# Patient Record
Sex: Female | Born: 1945
Health system: Southern US, Community
[De-identification: ages and names within clinical notes are randomized; demographics above are authoritative.]

## PROBLEM LIST (undated history)

## (undated) DIAGNOSIS — I509 Heart failure, unspecified: Secondary | ICD-10-CM

## (undated) DIAGNOSIS — G4733 Obstructive sleep apnea (adult) (pediatric): Secondary | ICD-10-CM

## (undated) DIAGNOSIS — Z872 Personal history of diseases of the skin and subcutaneous tissue: Secondary | ICD-10-CM

## (undated) DIAGNOSIS — I480 Paroxysmal atrial fibrillation: Secondary | ICD-10-CM

## (undated) DIAGNOSIS — I2510 Atherosclerotic heart disease of native coronary artery without angina pectoris: Secondary | ICD-10-CM

## (undated) DIAGNOSIS — Z800 Family history of malignant neoplasm of digestive organs: Secondary | ICD-10-CM

## (undated) DIAGNOSIS — I495 Sick sinus syndrome: Secondary | ICD-10-CM

## (undated) DIAGNOSIS — I5030 Unspecified diastolic (congestive) heart failure: Secondary | ICD-10-CM

## (undated) DIAGNOSIS — E785 Hyperlipidemia, unspecified: Secondary | ICD-10-CM

## (undated) DIAGNOSIS — M1990 Unspecified osteoarthritis, unspecified site: Secondary | ICD-10-CM

## (undated) DIAGNOSIS — E559 Vitamin D deficiency, unspecified: Secondary | ICD-10-CM

## (undated) DIAGNOSIS — G629 Polyneuropathy, unspecified: Secondary | ICD-10-CM

## (undated) DIAGNOSIS — Z8049 Family history of malignant neoplasm of other genital organs: Secondary | ICD-10-CM

## (undated) DIAGNOSIS — Z8619 Personal history of other infectious and parasitic diseases: Secondary | ICD-10-CM

## (undated) DIAGNOSIS — R0600 Dyspnea, unspecified: Secondary | ICD-10-CM

## (undated) DIAGNOSIS — R32 Unspecified urinary incontinence: Secondary | ICD-10-CM

## (undated) DIAGNOSIS — Z8051 Family history of malignant neoplasm of kidney: Secondary | ICD-10-CM

## (undated) DIAGNOSIS — E079 Disorder of thyroid, unspecified: Secondary | ICD-10-CM

## (undated) DIAGNOSIS — I10 Essential (primary) hypertension: Secondary | ICD-10-CM

## (undated) DIAGNOSIS — Z950 Presence of cardiac pacemaker: Secondary | ICD-10-CM

## (undated) DIAGNOSIS — I059 Rheumatic mitral valve disease, unspecified: Secondary | ICD-10-CM

## (undated) DIAGNOSIS — Z45018 Encounter for adjustment and management of other part of cardiac pacemaker: Secondary | ICD-10-CM

## (undated) DIAGNOSIS — I219 Acute myocardial infarction, unspecified: Secondary | ICD-10-CM

## (undated) DIAGNOSIS — E039 Hypothyroidism, unspecified: Secondary | ICD-10-CM

## (undated) DIAGNOSIS — N289 Disorder of kidney and ureter, unspecified: Secondary | ICD-10-CM

## (undated) DIAGNOSIS — E119 Type 2 diabetes mellitus without complications: Secondary | ICD-10-CM

## (undated) DIAGNOSIS — Z923 Personal history of irradiation: Secondary | ICD-10-CM

## (undated) HISTORY — DX: Unspecified osteoarthritis, unspecified site: M19.90

## (undated) HISTORY — DX: Atherosclerotic heart disease of native coronary artery without angina pectoris: I25.10

## (undated) HISTORY — DX: Unspecified diastolic (congestive) heart failure: I50.30

## (undated) HISTORY — PX: BACK SURGERY: SHX140

## (undated) HISTORY — DX: Family history of malignant neoplasm of kidney: Z80.51

## (undated) HISTORY — DX: Family history of malignant neoplasm of other genital organs: Z80.49

## (undated) HISTORY — DX: Unspecified urinary incontinence: R32

## (undated) HISTORY — DX: Sick sinus syndrome: I49.5

## (undated) HISTORY — PX: COLONOSCOPY: SHX174

## (undated) HISTORY — PX: CARDIOVERSION: SHX1299

## (undated) HISTORY — DX: Personal history of other infectious and parasitic diseases: Z86.19

## (undated) HISTORY — DX: Personal history of diseases of the skin and subcutaneous tissue: Z87.2

## (undated) HISTORY — DX: Family history of malignant neoplasm of digestive organs: Z80.0

## (undated) HISTORY — DX: Obstructive sleep apnea (adult) (pediatric): G47.33

## (undated) HISTORY — PX: TUBAL LIGATION: SHX77

---
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
## 2000-03-02 ENCOUNTER — Encounter: Payer: Self-pay | Admitting: Neurosurgery

## 2000-03-04 ENCOUNTER — Ambulatory Visit (HOSPITAL_COMMUNITY): Admission: RE | Admit: 2000-03-04 | Discharge: 2000-03-05 | Payer: Self-pay | Admitting: Neurosurgery

## 2000-03-04 ENCOUNTER — Encounter: Payer: Self-pay | Admitting: Neurosurgery

## 2000-06-16 ENCOUNTER — Encounter (HOSPITAL_COMMUNITY): Admission: RE | Admit: 2000-06-16 | Discharge: 2000-07-16 | Payer: Self-pay | Admitting: Family Medicine

## 2000-06-21 ENCOUNTER — Encounter: Payer: Self-pay | Admitting: Family Medicine

## 2000-06-21 ENCOUNTER — Ambulatory Visit (HOSPITAL_COMMUNITY): Admission: RE | Admit: 2000-06-21 | Discharge: 2000-06-21 | Payer: Self-pay | Admitting: Family Medicine

## 2000-06-26 ENCOUNTER — Encounter: Payer: Self-pay | Admitting: Neurosurgery

## 2000-06-26 ENCOUNTER — Ambulatory Visit (HOSPITAL_COMMUNITY): Admission: RE | Admit: 2000-06-26 | Discharge: 2000-06-27 | Payer: Self-pay | Admitting: Neurosurgery

## 2000-07-21 ENCOUNTER — Encounter (HOSPITAL_COMMUNITY): Admission: RE | Admit: 2000-07-21 | Discharge: 2000-08-12 | Payer: Self-pay | Admitting: Neurosurgery

## 2000-08-13 ENCOUNTER — Encounter (HOSPITAL_COMMUNITY): Admission: RE | Admit: 2000-08-13 | Discharge: 2000-09-12 | Payer: Self-pay | Admitting: Neurosurgery

## 2000-12-30 ENCOUNTER — Encounter: Payer: Self-pay | Admitting: Family Medicine

## 2000-12-30 ENCOUNTER — Ambulatory Visit (HOSPITAL_COMMUNITY): Admission: RE | Admit: 2000-12-30 | Discharge: 2000-12-30 | Payer: Self-pay | Admitting: Family Medicine

## 2001-11-24 ENCOUNTER — Encounter: Admission: RE | Admit: 2001-11-24 | Discharge: 2002-02-22 | Payer: Self-pay | Admitting: Unknown Physician Specialty

## 2002-02-24 ENCOUNTER — Inpatient Hospital Stay (HOSPITAL_COMMUNITY): Admission: AD | Admit: 2002-02-24 | Discharge: 2002-02-25 | Payer: Self-pay | Admitting: Family Medicine

## 2003-05-25 ENCOUNTER — Ambulatory Visit (HOSPITAL_COMMUNITY): Admission: RE | Admit: 2003-05-25 | Discharge: 2003-05-25 | Payer: Self-pay | Admitting: Family Medicine

## 2003-05-30 ENCOUNTER — Ambulatory Visit (HOSPITAL_COMMUNITY): Admission: RE | Admit: 2003-05-30 | Discharge: 2003-05-30 | Payer: Self-pay | Admitting: Family Medicine

## 2003-12-25 ENCOUNTER — Ambulatory Visit (HOSPITAL_COMMUNITY): Admission: RE | Admit: 2003-12-25 | Discharge: 2003-12-25 | Payer: Self-pay | Admitting: Family Medicine

## 2003-12-25 ENCOUNTER — Ambulatory Visit: Payer: Self-pay | Admitting: *Deleted

## 2003-12-25 ENCOUNTER — Ambulatory Visit: Payer: Self-pay | Admitting: Cardiology

## 2003-12-25 ENCOUNTER — Inpatient Hospital Stay (HOSPITAL_COMMUNITY): Admission: AD | Admit: 2003-12-25 | Discharge: 2003-12-27 | Payer: Self-pay | Admitting: Family Medicine

## 2004-01-10 ENCOUNTER — Ambulatory Visit (HOSPITAL_COMMUNITY): Admission: RE | Admit: 2004-01-10 | Discharge: 2004-01-10 | Payer: Self-pay | Admitting: *Deleted

## 2004-01-10 ENCOUNTER — Ambulatory Visit: Payer: Self-pay | Admitting: *Deleted

## 2004-01-18 ENCOUNTER — Ambulatory Visit: Payer: Self-pay | Admitting: Cardiology

## 2004-04-29 ENCOUNTER — Ambulatory Visit: Payer: Self-pay | Admitting: Cardiology

## 2004-06-27 ENCOUNTER — Ambulatory Visit (HOSPITAL_COMMUNITY): Admission: RE | Admit: 2004-06-27 | Discharge: 2004-06-27 | Payer: Self-pay | Admitting: Family Medicine

## 2004-11-19 ENCOUNTER — Encounter (HOSPITAL_COMMUNITY): Admission: RE | Admit: 2004-11-19 | Discharge: 2004-12-19 | Payer: Self-pay | Admitting: Endocrinology

## 2005-03-05 ENCOUNTER — Inpatient Hospital Stay (HOSPITAL_COMMUNITY): Admission: EM | Admit: 2005-03-05 | Discharge: 2005-03-09 | Payer: Self-pay | Admitting: Emergency Medicine

## 2005-03-07 ENCOUNTER — Ambulatory Visit: Payer: Self-pay | Admitting: Internal Medicine

## 2005-03-24 ENCOUNTER — Ambulatory Visit (HOSPITAL_COMMUNITY): Admission: RE | Admit: 2005-03-24 | Discharge: 2005-03-24 | Payer: Self-pay | Admitting: Internal Medicine

## 2005-03-27 ENCOUNTER — Ambulatory Visit: Payer: Self-pay | Admitting: Internal Medicine

## 2005-07-03 ENCOUNTER — Ambulatory Visit (HOSPITAL_COMMUNITY): Admission: RE | Admit: 2005-07-03 | Discharge: 2005-07-03 | Payer: Self-pay | Admitting: Family Medicine

## 2005-07-23 ENCOUNTER — Ambulatory Visit (HOSPITAL_COMMUNITY): Admission: RE | Admit: 2005-07-23 | Discharge: 2005-07-23 | Payer: Self-pay | Admitting: Family Medicine

## 2005-07-30 ENCOUNTER — Ambulatory Visit (HOSPITAL_COMMUNITY): Admission: RE | Admit: 2005-07-30 | Discharge: 2005-07-30 | Payer: Self-pay | Admitting: Family Medicine

## 2005-12-08 ENCOUNTER — Ambulatory Visit: Payer: Self-pay | Admitting: Cardiovascular Disease

## 2005-12-08 ENCOUNTER — Inpatient Hospital Stay (HOSPITAL_COMMUNITY): Admission: AD | Admit: 2005-12-08 | Discharge: 2005-12-10 | Payer: Self-pay | Admitting: Family Medicine

## 2005-12-22 ENCOUNTER — Ambulatory Visit: Payer: Self-pay | Admitting: Cardiology

## 2005-12-30 ENCOUNTER — Ambulatory Visit: Payer: Self-pay | Admitting: Cardiology

## 2005-12-30 ENCOUNTER — Encounter (HOSPITAL_COMMUNITY): Admission: RE | Admit: 2005-12-30 | Discharge: 2006-01-12 | Payer: Self-pay | Admitting: Cardiology

## 2005-12-30 ENCOUNTER — Ambulatory Visit (HOSPITAL_COMMUNITY): Admission: RE | Admit: 2005-12-30 | Discharge: 2005-12-30 | Payer: Self-pay

## 2006-01-27 ENCOUNTER — Ambulatory Visit: Payer: Self-pay | Admitting: Cardiology

## 2006-10-13 ENCOUNTER — Ambulatory Visit (HOSPITAL_COMMUNITY): Admission: RE | Admit: 2006-10-13 | Discharge: 2006-10-13 | Payer: Self-pay | Admitting: Family Medicine

## 2008-03-23 ENCOUNTER — Ambulatory Visit (HOSPITAL_COMMUNITY): Admission: RE | Admit: 2008-03-23 | Discharge: 2008-03-23 | Payer: Self-pay | Admitting: Family Medicine

## 2008-08-02 DIAGNOSIS — K5730 Diverticulosis of large intestine without perforation or abscess without bleeding: Secondary | ICD-10-CM | POA: Insufficient documentation

## 2008-08-02 DIAGNOSIS — E669 Obesity, unspecified: Secondary | ICD-10-CM | POA: Insufficient documentation

## 2008-08-02 DIAGNOSIS — K5289 Other specified noninfective gastroenteritis and colitis: Secondary | ICD-10-CM

## 2008-08-02 DIAGNOSIS — K921 Melena: Secondary | ICD-10-CM | POA: Insufficient documentation

## 2008-08-02 DIAGNOSIS — R112 Nausea with vomiting, unspecified: Secondary | ICD-10-CM

## 2008-08-02 DIAGNOSIS — R109 Unspecified abdominal pain: Secondary | ICD-10-CM

## 2008-08-02 DIAGNOSIS — R197 Diarrhea, unspecified: Secondary | ICD-10-CM

## 2008-08-02 DIAGNOSIS — Z9189 Other specified personal risk factors, not elsewhere classified: Secondary | ICD-10-CM

## 2008-08-03 ENCOUNTER — Ambulatory Visit: Payer: Self-pay | Admitting: Gastroenterology

## 2008-08-03 ENCOUNTER — Encounter: Payer: Self-pay | Admitting: Internal Medicine

## 2008-08-04 ENCOUNTER — Encounter: Payer: Self-pay | Admitting: Internal Medicine

## 2008-08-16 ENCOUNTER — Ambulatory Visit (HOSPITAL_COMMUNITY): Admission: RE | Admit: 2008-08-16 | Discharge: 2008-08-16 | Payer: Self-pay | Admitting: Internal Medicine

## 2008-08-16 ENCOUNTER — Ambulatory Visit: Payer: Self-pay | Admitting: Internal Medicine

## 2008-08-16 HISTORY — PX: COLONOSCOPY: SHX174

## 2008-12-31 ENCOUNTER — Ambulatory Visit: Payer: Self-pay | Admitting: Internal Medicine

## 2008-12-31 ENCOUNTER — Inpatient Hospital Stay (HOSPITAL_COMMUNITY): Admission: EM | Admit: 2008-12-31 | Discharge: 2009-01-01 | Payer: Self-pay | Admitting: Emergency Medicine

## 2009-01-01 ENCOUNTER — Encounter (INDEPENDENT_AMBULATORY_CARE_PROVIDER_SITE_OTHER): Payer: Self-pay | Admitting: Emergency Medicine

## 2010-02-19 ENCOUNTER — Encounter (HOSPITAL_COMMUNITY): Payer: Medicare Other | Attending: Ophthalmology

## 2010-02-19 ENCOUNTER — Other Ambulatory Visit: Payer: Self-pay | Admitting: Ophthalmology

## 2010-02-19 DIAGNOSIS — Z01812 Encounter for preprocedural laboratory examination: Secondary | ICD-10-CM | POA: Insufficient documentation

## 2010-02-19 DIAGNOSIS — Z01810 Encounter for preprocedural cardiovascular examination: Secondary | ICD-10-CM | POA: Insufficient documentation

## 2010-02-19 LAB — DIFFERENTIAL
Basophils Absolute: 0 10*3/uL (ref 0.0–0.1)
Eosinophils Relative: 2 % (ref 0–5)
Lymphocytes Relative: 40 % (ref 12–46)
Monocytes Absolute: 0.5 10*3/uL (ref 0.1–1.0)

## 2010-02-19 LAB — CBC
HCT: 39.5 % (ref 36.0–46.0)
MCHC: 33.2 g/dL (ref 30.0–36.0)
RDW: 13.2 % (ref 11.5–15.5)

## 2010-02-19 LAB — BASIC METABOLIC PANEL
BUN: 17 mg/dL (ref 6–23)
Calcium: 9.9 mg/dL (ref 8.4–10.5)
GFR calc non Af Amer: 48 mL/min — ABNORMAL LOW (ref >60)
Glucose, Bld: 203 mg/dL — ABNORMAL HIGH (ref 70–99)
Sodium: 140 mEq/L (ref 135–145)

## 2010-02-26 ENCOUNTER — Ambulatory Visit (HOSPITAL_COMMUNITY)
Admission: RE | Admit: 2010-02-26 | Discharge: 2010-02-26 | Disposition: A | Discharge status: Home / Self Care | Payer: Medicare Other | Source: Ambulatory Visit | Attending: Ophthalmology | Admitting: Ophthalmology

## 2010-02-26 DIAGNOSIS — H2510 Age-related nuclear cataract, unspecified eye: Secondary | ICD-10-CM | POA: Insufficient documentation

## 2010-02-26 DIAGNOSIS — Z794 Long term (current) use of insulin: Secondary | ICD-10-CM | POA: Insufficient documentation

## 2010-02-26 DIAGNOSIS — E119 Type 2 diabetes mellitus without complications: Secondary | ICD-10-CM | POA: Insufficient documentation

## 2010-02-26 DIAGNOSIS — I10 Essential (primary) hypertension: Secondary | ICD-10-CM | POA: Insufficient documentation

## 2010-02-26 DIAGNOSIS — Z79899 Other long term (current) drug therapy: Secondary | ICD-10-CM | POA: Insufficient documentation

## 2010-03-08 ENCOUNTER — Encounter (HOSPITAL_COMMUNITY): Payer: Medicare Other | Attending: Ophthalmology

## 2010-03-12 ENCOUNTER — Ambulatory Visit (HOSPITAL_COMMUNITY)
Admission: RE | Admit: 2010-03-12 | Discharge: 2010-03-12 | Disposition: A | Discharge status: Home / Self Care | Payer: Medicare Other | Source: Ambulatory Visit | Attending: Ophthalmology | Admitting: Ophthalmology

## 2010-03-12 DIAGNOSIS — E119 Type 2 diabetes mellitus without complications: Secondary | ICD-10-CM | POA: Insufficient documentation

## 2010-03-12 DIAGNOSIS — Z79899 Other long term (current) drug therapy: Secondary | ICD-10-CM | POA: Insufficient documentation

## 2010-03-12 DIAGNOSIS — H2510 Age-related nuclear cataract, unspecified eye: Secondary | ICD-10-CM | POA: Insufficient documentation

## 2010-03-12 DIAGNOSIS — Z794 Long term (current) use of insulin: Secondary | ICD-10-CM | POA: Insufficient documentation

## 2010-04-15 LAB — BASIC METABOLIC PANEL
CO2: 28 mEq/L (ref 19–32)
Calcium: 9.1 mg/dL (ref 8.4–10.5)
GFR calc Af Amer: >60 mL/min (ref >60)
GFR calc non Af Amer: >60 mL/min (ref >60)
Sodium: 140 mEq/L (ref 135–145)

## 2010-04-15 LAB — D-DIMER, QUANTITATIVE: D-Dimer, Quant: 1.95 ug/mL-FEU — ABNORMAL HIGH (ref 0.00–0.48)

## 2010-04-15 LAB — BRAIN NATRIURETIC PEPTIDE: Pro B Natriuretic peptide (BNP): 234 pg/mL — ABNORMAL HIGH (ref 0.0–100.0)

## 2010-04-15 LAB — CBC
HCT: 39.7 % (ref 36.0–46.0)
MCHC: 33.7 g/dL (ref 30.0–36.0)
MCV: 89.8 fL (ref 78.0–100.0)
Platelets: 219 10*3/uL (ref 150–400)
RDW: 14.7 % (ref 11.5–15.5)

## 2010-04-15 LAB — LIPID PANEL
Cholesterol: 144 mg/dL (ref 0–200)
Total CHOL/HDL Ratio: 4.4 RATIO
Triglycerides: 344 mg/dL — ABNORMAL HIGH (ref <150)
VLDL: 69 mg/dL — ABNORMAL HIGH (ref 0–40)

## 2010-04-15 LAB — URINALYSIS, ROUTINE W REFLEX MICROSCOPIC
Bilirubin Urine: NEGATIVE
Nitrite: NEGATIVE
Protein, ur: NEGATIVE mg/dL
Specific Gravity, Urine: 1.007 (ref 1.005–1.030)
Urobilinogen, UA: 0.2 mg/dL (ref 0.0–1.0)

## 2010-04-15 LAB — HEMOGLOBIN A1C
Hgb A1c MFr Bld: 7.3 % — ABNORMAL HIGH (ref 4.6–6.1)
Mean Plasma Glucose: 163 mg/dL

## 2010-04-15 LAB — POCT I-STAT, CHEM 8
Calcium, Ion: 1.03 mmol/L — ABNORMAL LOW (ref 1.12–1.32)
HCT: 41 % (ref 36.0–46.0)
TCO2: 24 mmol/L (ref 0–100)

## 2010-04-15 LAB — TSH: TSH: 1.284 u[IU]/mL (ref 0.350–4.500)

## 2010-04-15 LAB — CARDIAC PANEL(CRET KIN+CKTOT+MB+TROPI)
CK, MB: 3.3 ng/mL (ref 0.3–4.0)
Relative Index: INVALID (ref 0.0–2.5)

## 2010-04-15 LAB — POCT CARDIAC MARKERS

## 2010-04-15 LAB — GLUCOSE, CAPILLARY

## 2010-04-15 LAB — CK TOTAL AND CKMB (NOT AT ARMC): Relative Index: 3.8 — ABNORMAL HIGH (ref 0.0–2.5)

## 2010-04-15 LAB — TROPONIN I: Troponin I: 0.04 ng/mL (ref 0.00–0.06)

## 2010-04-15 LAB — DIFFERENTIAL
Basophils Absolute: 0.1 10*3/uL (ref 0.0–0.1)
Basophils Relative: 1 % (ref 0–1)
Eosinophils Absolute: 0.2 10*3/uL (ref 0.0–0.7)
Eosinophils Relative: 2 % (ref 0–5)

## 2010-04-20 LAB — DIFFERENTIAL
Basophils Absolute: 0 10*3/uL (ref 0.0–0.1)
Eosinophils Relative: 1 % (ref 0–5)
Lymphocytes Relative: 37 % (ref 12–46)
Lymphs Abs: 2 10*3/uL (ref 0.7–4.0)
Neutro Abs: 2.9 10*3/uL (ref 1.7–7.7)

## 2010-04-20 LAB — CBC
HCT: 38.6 % (ref 36.0–46.0)
MCHC: 34 g/dL (ref 30.0–36.0)
MCV: 90.6 fL (ref 78.0–100.0)
Platelets: 179 10*3/uL (ref 150–400)
RDW: 14.1 % (ref 11.5–15.5)

## 2010-05-28 NOTE — Op Note (Signed)
NAME:  [name], [name]               ACCOUNT NO.:  [id number]   MEDICAL RECORD NO.:  [id number]          PATIENT TYPE:  AMB   LOCATION:  DAY                           FACILITY:  [facility]   PHYSICIAN:  R. Roetta Sessions, M.D. DATE OF BIRTH:  [birth year]   DATE OF PROCEDURE:  08/16/2008  DATE OF DISCHARGE:                               OPERATIVE REPORT   DIAGNOSTIC COLONOSCOPY   INDICATIONS FOR PROCEDURE:  A 65 year old lady with 1 out of 3 mail-in  cards positive for occult blood.  A couple of months back she had a  period where she had a couple of episodes of painless, small volume  blood per rectum.  She has no lower GI tract symptoms at this time.  Dr.  Karilyn Cota performed colonoscopy 10 years ago at which time she was found  have diverticulosis.  She has no personal history of polyps.  No family  history of colon polyps or colon cancer.  Colonoscopy is now being done.  Risks, benefits, alternatives and limitations have been discussed,  questions answered.  Please see documentation in the medical record.   PROCEDURE NOTE:  O2 saturation, blood pressure, pulse and respirations  were monitored throughout the entire procedure.  Conscious sedation  Versed 4 mg IV, Demerol 50 mg IV in divided doses.  Instrument:  Pentax  video chip system.   FINDINGS:  Digital rectal exam revealed no abnormalities.   ENDOSCOPIC FINDINGS:  The prep was marginal.  Colon:  Colonic mucosa was  surveyed from the rectosigmoid junction to the left, transverse, right  colon to the appendiceal orifice, ileocecal valve and cecum.  These  structures were well seen and photographed for the record.  From this  level, scope was slowly and cautiously withdrawn.  All previously  mentioned mucosal surfaces were again seen.  On the way in, there was  quite a bit of semi-formed chunky stool which was lavaged out.  The  patient was noted have densely populated pan colonic diverticula all the  way to the cecum.  However, the  remainder of the colonic mucosa appeared  normal.  Scope was pulled down in the rectum where thorough examination  of the rectal mucosa including retroflexed view of the anal verge  demonstrated no abnormalities.  The patient tolerated the procedure  well.  Cecal withdrawal time 7 minutes.   IMPRESSION:  Normal rectum.  Densely populated pan colonic diverticula.  The remainder of the colonic mucosa appeared normal.  Today's findings  are reassuring.  It sounds like she had some trivial bleeding 2 months  ago.   RECOMMENDATIONS:  1. Daily Metamucil or Benefiber fiber supplement.  2. Check a CBC today to see where we stand.  If she is not anemic,      given the fact she is not having any GI symptoms at this time, I      would not recommend further evaluation, other than she ought to      consider returning in 10 years for another colonoscopy.      Jonathon Bellows, M.D.  Electronically Signed  RMR/MEDQ  D:  08/16/2008  T:  08/16/2008  Job:  [id number]

## 2010-05-31 NOTE — Consult Note (Signed)
NAME:  [name], [name] NO.:  [id number]   MEDICAL RECORD NO.:  [id number]                   PATIENT TYPE:  INP   LOCATION:  A201                                 FACILITY:  [facility]   PHYSICIAN:   Bing, M.D. [facility]           DATE OF BIRTH:  1945-08-03   DATE OF CONSULTATION:  02/24/2002  DATE OF DISCHARGE:                                   CONSULTATION   HISTORY OF PRESENT ILLNESS:  A 65 year old woman with multiple  cardiovascular risk factors admitted to [facility] after a  prolonged episode of chest pressure.  The patient has no definite known  cardiac disease.  She has had occasional chest pressure in the past.  A  stress Cardiolite study in 1999 was reportedly negative.  In recent days,  after changing the scheduling of her medications, she has noted tachycardia  at night.  Last night this was associated with mid substernal chest pressure  of moderate severity.  There was no radiation.  There were no associated  symptoms.  She was unable to find a physician or perform any activity that  resulted in a change in her symptoms.  The discomfort gradually faded after  perhaps four or five hours allowing her to fall asleep.   Cardiovascular risk factors include hypertension and diabetes.  She is  participating in the [employer] study which apparently assesses the effects of  good blood pressure and diabetic control.  She does not use tobacco  products.  She believes that cholesterol has been good in the past.   CURRENT MEDICATIONS:  1. Prandin 2 mg q.h.s.  2. Avandia 4 mg b.i.d.  3. Metoprolol 200 mg daily.  4. Zestoretic 40/25 mg daily.  5. Zestril 20 mg daily.  6. Hydralazine 50 mg t.i.d.   ALLERGIES:  The patient reports adverse reactions to VALIUM, DEMEROL, TYLOX,  and PERCOCET.   PAST MEDICAL HISTORY:  Notable for previous lumbar microdiskectomy and a  lumbar hemilaminectomy.   SOCIAL HISTORY:  Lives in [place] with husband.   Performs own housework  but sometimes this leads to substantial fatigue.  She has three children and  three grandchildren.  She does not use tobacco products nor significant  amounts of alcohol.   FAMILY HISTORY:  Markedly positive for diabetes and cardiovascular disease.   REVIEW OF SYSTEMS:  Occasional joint swelling.  Occasional numbness in hands  and arms.  All other systems negative.   PHYSICAL EXAMINATION:  GENERAL:  Pleasant, overweight woman in no acute  distress.  VITAL SIGNS:  Blood pressure 170/80, heart rate 78 and regular, respirations  20, temperature 100, weight 182, CBG 207.  SKIN:  No significant lesions.  HEENT:  Anicteric sclerae.  NECK:  No jugular venous distention.  No carotid bruits.  ENDOCRINE:  No thyromegaly.  HEMATOPOIETIC:  No adenopathy.  CARDIAC:  Normal first and second heart sounds.  Fourth heart sound present.  LUNGS:  Clear.  ABDOMEN:  Soft and nontender.  No organomegaly.  EXTREMITIES:  Normal distal pulses.  No edema.  NEUROMUSCULAR:  Symmetric strength and tone.   LABORATORIES:  EKG:  Normal sinus rhythm, markedly delayed R-wave  progression probably related to lead placement.  Prior anterior myocardial  infarction cannot be excluded.  _______ ST segment depression in leads I and  L consistent with lateral ischemia.  When compared to a prior tracing  performed in 1999 lateral ST segment abnormalities were present, but not as  pronounced.   IMPRESSION:  The patient has substantial cardiovascular risk factors with  worrisome chest discomfort.  Her symptoms would be considered atypical for  angina in that they did not occur with exertion and have not occurred  repeatedly and reproducibly.  Since discomfort occurred only with  palpitations, this could be related to a supraventricular tachycardia  without underlying coronary disease.  The patient will be monitored  continuously.  Serial cardiac markers and EKGs will be obtained.  If these  remain  negative or nondiagnostic, we will proceed with a stress Cardiolite  study on the second hospital day.   Control of hypertension is suboptimal, although she did not take her  medications today.  I see no benefit to an extremely high dose ACE  inhibitor.  Hydralazine is relatively ineffective as an antihypertensive  agent.  These medications will be discontinued.  Diltiazem will be added at  a dose of 60 mg t.i.d.  If additional antihypertensive medication is needed,  transdermal clonidine can be added.   It is our pleasure assessing this delightful woman.  We will be happy to  follow her with you both in and out of hospital.                                               Yutan Bing, M.D. [facility]    RR/MEDQ  D:  02/24/2002  T:  02/24/2002  Job:  [id number]

## 2010-05-31 NOTE — Procedures (Signed)
NAME:  [name], HARMAN NO.:  [id number]   MEDICAL RECORD NO.:  [id number]          PATIENT TYPE:  REC   LOCATION:  RAD                           FACILITY:  [facility]   PHYSICIAN:  Vida Roller, M.D.   DATE OF BIRTH:  13-Nov-1945   DATE OF PROCEDURE:  DATE OF DISCHARGE:                                    STRESS TEST   PROCEDURE:  Stress Cardiolite   HISTORY:  This is a 65 year old female with past medical history significant  for diabetes and hypertension who was recently admitted to [facility] from December 12 to December 14 with apparent congestive heart  failure.  She has had 3 sets of cardiac enzymes that were negative. She had  an echocardiogram revealing normal LV function, mild MR and mild LVH.  She  was set up for this stress Cardiolite as an outpatient and has followup  scheduled with Dr. Dietrich Pates.   BASELINE DATA:  EKG reveals a sinus rhythm at 73 beats/minute with some  nonspecific ST abnormalities and poor R wave progression.  Blood pressure is  162/90.  Of note, the patient did not take her blood pressure medications  this morning and she has been holding her Toprol for the last 48 hours.   The patient exercised for a total of 7 minutes to 4.6 METS.  Maximum heart  rate was 155 beats/minute which is 97% of predicated maximum.  Maximum blood  pressure was 220/90.  EKG revealed no arrhythmias.  She did have some ST  depression in the inferolateral leads, resolving after approximately 12  minutes in recovery.  Blood pressure resolved down to 190/82.  She did take  her blood pressure medicines after her stress test was over. She complained  of shortness of breath with no chest discomfort during exercise which  resolved in recovery.  Exercise was stopped secondary to elevated blood  pressure and fatigue.   Final images and results are pending MD review.     Amy   AB/MEDQ  D:  01/10/2004  T:  01/10/2004  Job:  [id number]

## 2010-05-31 NOTE — Procedures (Signed)
NAME:  [name], [name]               ACCOUNT NO.:  [id number]   MEDICAL RECORD NO.:  [id number]          PATIENT TYPE:  AMB   LOCATION:  A226                          FACILITY:  [facility]   PHYSICIAN:  Scott A. Gerda Diss, MD    DATE OF BIRTH:  Feb 08, 1945   DATE OF PROCEDURE:  DATE OF DISCHARGE:                              EKG INTERPRETATION   EKG:  Patient with normal sinus rhythm, good T- wave abnormality with  flattening in V6 and some point appears.  The possible intraseptal  infarct age undetermined but there is a small upwave before the Q.   IMPRESSION:  Abnormal EKG.      Scott A. Gerda Diss, MD  Electronically Signed     SAL/MEDQ  D:  01/16/2006  T:  01/16/2006  Job:  [id number]

## 2010-05-31 NOTE — Procedures (Signed)
NAMEDESERAY, DAPONTE NO.:  [id number]   MEDICAL RECORD NO.:  [id number]          PATIENT TYPE:  INP   LOCATION:  A226                          FACILITY:  [facility]   PHYSICIAN:  Peter C. Eden Emms, MD, FACCDATE OF BIRTH:  02-May-1945   DATE OF PROCEDURE:  DATE OF DISCHARGE:                                ECHOCARDIOGRAM   INDICATIONS:  Chest pain, heart failure.   Left ventricular cavity size was normal.  There was mild basal septal  hypertrophy as well as mild concentric left ventricular hypertrophy.  Septal thickness was 14 mm.  There were no regional wall motion  abnormalities.  Mitral valve was mildly thickened with mild MR.  Left  atrium and right-sided cardiac chambers were normal.  There was no  evidence of pulmonary hypertension.  Aortic valve was trileaflet and  sclerotic.  There was no stenosis.  Subcostal imaging revealed no  atrial/septal defect, no source of embolus, no effusion.   FINAL IMPRESSION:  1. Mild left ventricular hypertrophy.  Ejection fraction 60%.  2. Mild mitral regurgitation.  3. Normal left atrium, right atrium and right ventricle.  4. Aortic valve sclerosis.  5. No pericardial effusion.  6. Mild tricuspid regurgitation with no evidence of pulmonary      hypertension.      [name]. Eden Emms, MD, [facility]  Electronically Signed     PCN/MEDQ  D:  12/09/2005  T:  12/09/2005  Job:  [id number]

## 2010-05-31 NOTE — Procedures (Signed)
NAMECANYON, WILLOW NO.:  [id number]   MEDICAL RECORD NO.:  [id number]          PATIENT TYPE:  OUT   LOCATION:  RAD                           [facility]ACILITY:  [facility]   PHYSICIAN:  Donna Bernard, M.D.DATE OF BIRTH:  1945-03-01   DATE OF PROCEDURE:  DATE OF DISCHARGE:  12/25/2003                                EKG INTERPRETATION   EKG reveals normal sinus rhythm with no significant ST-T changes. There is  poor RV progression in the anterior leads suggestive of old anterior MI.     Kristine Royal   WSL/MEDQ  D:  01/09/2004  T:  01/09/2004  Job:  [id number]

## 2010-05-31 NOTE — Procedures (Signed)
NAMETYKEISHA, PEER               ACCOUNT NO.:  [id number]   MEDICAL RECORD NO.:  [id number]          PATIENT TYPE:  INP   LOCATION:  A226                          F[facility]ILITY:  [facility]   PHYSICIAN:  Scott A. Gerda Diss, MD    DATE OF BIRTH:  1945-04-16   DATE OF PROCEDURE:  12/10/2005  DATE OF DISCHARGE:                              EKG INTERPRETATION   Normal sinus rhythm bordering on bradycardia.  Poor R wave progression.  Nonspecific T wave abnormalities.  Abnormal EKG.      Scott A. Gerda Diss, MD  Electronically Signed     SAL/MEDQ  D:  01/16/2006  T:  01/16/2006  Job:  [id number]

## 2010-05-31 NOTE — Discharge Summary (Signed)
[name], [name] NO.:  [id number]   MEDICAL RECORD NO.:  [id number]          PATIENT TYPE:  INP   LOCATION:  A303                          FACILITY:  [facility]   PHYSICIAN:  Osvaldo Shipper, MD     DATE OF BIRTH:  11/21/1945   DATE OF ADMISSION:  03/05/2005  DATE OF DISCHARGE:  02/25/2007LH                                 DISCHARGE SUMMARY   PRIMARY CARE PHYSICIAN:  Dr. Gerda Diss.   DISCHARGE DIAGNOSES:  1.  Small bowel enteritis leading to small bowel obstruction, currently much      improved.  2.  History of diabetes.  3.  History of hypertension.  4.  History of obesity.   HISTORY OF PRESENT ILLNESS:  The patient is a 65 year old Caucasian female  with a past medical history as mentioned above who presented to the  emergency department with complaints of abdominal pain, nausea, vomiting and  possible fever.  The patient was admitted to the hospital for further  evaluation.  She underwent abdominal film which showed no evidence for bowel  obstruction or perforation.  There was a question of minimal wall thickening  of the small bowel loop in the left mid abdomen.  The patient subsequently  underwent ultrasound of the abdomen which showed gallbladder sludge without  wall thickening or sonographic Murphy's sign.  A 9.0-mm cyst in the right  lower kidney was observed during this time.  Because of these inconclusive  studies, the patient underwent a CT of the abdomen and pelvis which showed  extensive colonic diverticulosis without definite diverticulitis.  Minimally  prominent proximal small bowel loops with fold and wall thickening were  noticed which raised the question of infectious enteritis or partial small  bowel obstruction.  Subsequent to this study, patient was started on  antibiotics.  The patient continued to show improvement with this regimen.  She was initially kept n.p.o. and then started on a liquid diet which was  advanced to a diabetic diet.   Patient has tolerated this diet well.  She was  also seen by Dr. Karilyn Cota from gastroenterology services whose recommendations  were followed.  The patient was switched to p.o. antibiotics today, and she  was considered stable enough for discharge.  The patient wished to go as  well.  Her blood sugars were elevated this morning.  The patient takes  Lantus at home which was held at the time of admission because of the  patient's n.p.o. status.  The patient was asked to take her Lantus  immediately as soon as she reaches home.  Her other vital signs remained  stable.  She was considered stable for discharge.  The patient also  developed mild diarrhea which Dr. Karilyn Cota felt could be controlled with  Imodium.   DISCHARGE MEDICATIONS:  1.  Ciprofloxacin 500 mg p.o. b.i.d. for 10 more days.  2.  Flagyl 500 mg b.i.d. for 10 more days.  3.  She was prescribed some Imodium for some diarrhea that she was having.   She was asked to resume her other medications as before.   FOLLOWUP:  Follow up with Dr. Karilyn Cota and Dr. Gerda Diss.   Followup studies need to be done as an outpatient and include CT angio of  the abdomen to look at the abdominal vasculature.  This will be arranged by  Dr. Karilyn Cota.   The patient was told that if her symptoms were to get worse or if her  diarrhea worsens, she needs to seek attention again.   Imaging studies have been described above including CT of the abdomen and  pelvis, ultrasound of the abdomen and abdominal x-ray.   DIET:  The patient will have a diabetic diet.   ACTIVITY:  No restrictions.   CONSULTATIONS:  Consultation obtained from Dr. Karilyn Cota from gastroenterology.      Osvaldo Shipper, MD  Electronically Signed     GK/MEDQ  D:  03/09/2005  T:  03/09/2005  Job:  [id number]   cc:   Lorin Picket A. Gerda Diss, MD  Fax: [phone]   Lionel December, M.D.  [address]  [place]  [place] [address]

## 2010-05-31 NOTE — Letter (Signed)
December 22, 2005    W. Simone Curia, M.D.  [address], [address]  [place], [place] [address]   RE:  [name], [name]  MRN:  [id number]  /  DOB:  Feb 03, 1945   Dear Brett Canales:   [name] returns to the office following a recent admission to [facility] with congestive heart failure.  She was evaluated by Dr.  Eden Emms, who recommended a slight increase in her dose of furosemide and  post-hospitalization stress testing.  This study was never scheduled.  [name] is back to baseline.  Her medications are unchanged, except  for substitution of 60 mg of furosemide daily from her previous dose of  40 mg.  She does not watch her weight.  She notes no change in diet and  no noncompliance with her medical therapy.   On exam, a pleasant overweight woman in no acute distress.  The weight  is 204, six pounds more than in April of 2006 at her last office visit.  Blood pressure 140/70, heart rate 60 and regular, respirations 16.  NECK:  No jugular venous distention; normal carotid upstrokes without  bruits.  LUNGS:  Clear.  CARDIAC:  Normal first and second heart sounds;  fourth heart sound present.  ABDOMEN:  Soft and nontender; no  organomegaly.  EXTREMITIES:  Distal pulses intact; 1/2+ edema.   IMPRESSION:  [name] has multiple cardiovascular risk factors and 2  admissions for congestive heart failure.  This certainly raises concern  about coronary artery disease.  I am somewhat inclined to proceed with  coronary angiography, but the patient would prefer  noninvasive testing.  We will repeat her stress nuclear study, which was  last done 2 years ago.  I will reassess her after that test has been  completed.  I suggested she weigh herself daily and call for significant  increase.  We will assess a metabolic profile due to her increased dose  of diuretic.  Vaccinations are up to date.    Sincerely,      Gerrit Friends. Dietrich Pates, MD, [facility]  Electronically Signed    RMR/MedQ  DD:  12/22/2005  DT: 12/23/2005  Job #: [id number]

## 2010-05-31 NOTE — H&P (Signed)
NAME:  [name], [name]                         ACCOUNT NO.:  [id number]   MEDICAL RECORD NO.:  [id number]                   PATIENT TYPE:  INP   LOCATION:  A201                                 FACILITY:  [facility]   PHYSICIAN:  Donna Bernard, M.D.             DATE OF BIRTH:  [birth year]   DATE OF ADMISSION:  02/24/2002  DATE OF DISCHARGE:                                HISTORY & PHYSICAL   CHIEF COMPLAINT:  Chest discomfort.   HISTORY OF PRESENT ILLNESS:  This patient is a 65 year old white female with  a longstanding history of hypertension, type 2 diabetes, hyperlipidemia, who  presented to the office after a one-year hiatus.  The patient states she is  now receiving her primary health care through a study through [facility]  which provides her medicines free of charge.  The patient has done  relatively well up until the night prior to admission, last evening, and on  into today.  She developed a sense of deep substernal pressure.  This was  accompanied by slight shortness of breath.  In addition, the patient noted  some tingling around her lips.  She had no particular nausea or diaphoresis.  The patient has had no exertional discomfort prior to this.  Family history  is significant for recent development of coronary artery disease in a  sibling who has had a stent.   MEDICATIONS:  The patient claims compliance with her medications, which  include:  1. Prandin 2 mg a.c.  2. Avandia 4 mg p.o. b.i.d.  3. Metformin 1000 mg q.a.m., 1000 q.p.m., 500 at bedtime.  4. Amaryl 4 mg p.o. b.i.d.  5. Toprol 100 mg twice per day.  6. Zestoretic, rather high dose with 220/12.5 tablets per day plus 20 plain     per day.  7. Hydralazine 50 mg three times daily.  Of note, the patient recently     initiated hydralazine.   PAST SURGICAL HISTORY:  Remote disk surgery.   FAMILY HISTORY:  Positive for the coronary artery disease, type 2 diabetes,  hypertension, hyperlipidemia.   SOCIAL HISTORY:   The patient is married.  Retired.  No smoking history.  No  alcohol abuse.  Three children.   ALLERGIES:  LESCOL.   REVIEW OF SYSTEMS:  Otherwise negative.   PHYSICAL EXAMINATION:  VITAL SIGNS:  Blood pressure 168/80.  Weight 182.  GENERAL:  The patient is alert, with some anxiety evident.  HEENT:  Normal.  NECK:  No JVD, no lymphadenopathy.  Pharynx normal.  LUNGS:  Clear.  HEART:  No significant murmurs.  No chest wall tenderness.  ABDOMEN:  Soft.  Good bowel sounds.  No rebound or guarding.  EXTREMITIES:  Normal.  NEUROLOGIC:  Intact.   LABORATORY DATA:  EKG:  Normal sinus rhythm with atypical ST segment changes  but nondiagnostic in leads 1 and 2.  She also has poor R-wave progression  through V3.  Cardiac enzymes pending.   IMPRESSION:  1. Chest pain, somewhat bothersome nature, with several significant risk     factors.  2. Type 2 diabetes.  3. Hypertension.  4. Hyperlipidemia.   PLAN:  Admit for observation, telemetry, cardiology consult.  Further orders  as noted in the chart.                                               Donna Bernard, M.D.    WSL/MEDQ  D:  02/24/2002  T:  02/24/2002  Job:  [id number]

## 2010-05-31 NOTE — Procedures (Signed)
[name], [name] NO.:  [id number]   MEDICAL RECORD NO.:  [id number]          PATIENT TYPE:  INP   LOCATION:  A222                          FACILITY:  [facility]   PHYSICIAN:  [place] Bing, M.D.  DATE OF BIRTH:  15-Dec-1945   DATE OF PROCEDURE:  12/25/2003  DATE OF DISCHARGE:                                  ECHOCARDIOGRAM   REFERRING PHYSICIAN:  Lubertha South, M.D.   CLINICAL DATA:  A 65 year old woman with congestive heart failure and  hypertension.  M-mode:  Aorta 3.1, left atrium 4.9, septum 1.6, posterior  wall 1.2, LV diastole 4.2, LV systole 2.3.   1.  Technically suboptimal and somewhat limited echocardiographic study.  2.  Mild left atrial enlargement; normal right atrium and right ventricle.  3.  Normal aortic root; aortic valve not well seen but probably normal.  No      stenosis nor insufficiency by Doppler.  4.  Very mild mitral valve calcification and mild annular calcification;      mild mitral regurgitation.  5.  Normal internal dimension of the left ventricle; mild concentric      hypertrophy.  Normal regional and global LV systolic function.     [place]obe   RR/MEDQ  D:  12/26/2003  T:  12/26/2003  Job:  [id number]

## 2010-05-31 NOTE — Consult Note (Signed)
NAMETEMPRENCE, RHINES NO.:  [id number]   MEDICAL RECORD NO.:  [id number]          PATIENT TYPE:  INP   LOCATION:  A303                          FACILITY:  [facility]   PHYSICIAN:  Lionel December, M.D.    DATE OF BIRTH:  April 22, 1945   DATE OF CONSULTATION:  03/07/2005  DATE OF DISCHARGE:                                   CONSULTATION   REASON FOR CONSULTATION:  Partial small-bowel obstruction.   HISTORY OF PRESENT ILLNESS:  [name] is a 65 year old Caucasian female  who was admitted to the [facility] two days ago after developing  acute onset mid-abdominal pain associated with nausea and vomiting.  She  describes having abdominal distension and pain from her epigastrium down to  the suprapubic region.  It was crampy in quality.  She notes having normal  bowel movements up until the day before admission.  No melena or rectal  bleeding.  She has chronic intermittent heartburn for which she takes Tums  or Prilosec p.r.n.  She denies any unintentional weight loss.  On digital  rectal examination by Dr. Gerda Diss, her stool was heme-negative.   Initially, she had an abdominal ultrasound which revealed gallbladder  sludge, common bile duct diameter was normal, possible fat infiltration of  the liver, small perihepatic ascites.  CT of the abdomen and pelvis  yesterday revealed a distended gallbladder but no calcified stones, slight  prominence of the gastric wall at the cardia, although the stomach was  completely distended; minimally dilated proximal small bowel loops with mild  fold and wall thickening and mild surrounding hazy infiltrative changes  consistent with infectious enteritis versus partial small-bowel obstruction.  She also had extensive colonic diverticulosis without evidence of  diverticulitis, slightly enlarged aortocaval lymph node measuring 15 x 11  mm, ascites.  LFTs were normal, except for albumin of 3.2, amylase and  lipase normal.  White count was  initially 14,200, and today it is 10,800.   MEDICATIONS PRIOR TO ADMISSION:  1.  Prilosec p.r.n.  2.  Tums p.r.n.  3.  Prandin 2 mg before lunch.  4.  Metformin 1000 mg before supper and 500 mg at bedtime.  5.  Toprol-XL 200 mg daily.  6.  Hydralazine 50 mg t.i.d.  7.  Aspirin 81 mg daily.  8.  Lantus 68 units q.h.s.  9.  Lispro insulin sliding scale.  10. Lasix 40 mg daily.  11. Mevacor 40 mg q.h.s.  12. Potassium 20 mEq daily.   ALLERGIES:  NO KNOWN DRUG ALLERGIES.   PAST MEDICAL HISTORY:  1.  Hypertension.  2.  Type 2 diabetes mellitus.  3.  Hyperlipidemia.  4.  Cardiomegaly.  5.  Diverticulosis.   PAST SURGICAL HISTORY:  1.  Back surgery for disks twice.  2.  Last colonoscopy in August of 2000 by Dr. Karilyn Cota revealed diverticulosis      involving the transverse, descending, and sigmoid colon.  She has never      had an upper endoscopy.   FAMILY HISTORY:  Negative for colorectal cancer.  She had a sister who  succumbed to  pancreatic cancer.  Another sister was treated for ovarian  cancer and is doing well.   SOCIAL HISTORY:  She is married and has three children.  She is retired.  She has never been a smoker.  No alcohol use.   REVIEW OF SYSTEMS:  See HPI for GI and constitutional.  CARDIOPULMONARY:  No  chest pain or shortness of breath.  GENITOURINARY:  No dysuria.   PHYSICAL EXAMINATION:  VITAL SIGNS:  Temperature 98.3, pulse 59,  respirations 18, blood pressure 125/66.  GENERAL:  Pleasant, moderately obese, elderly Caucasian female in no acute  distress.  SKIN:  Warm and dry.  No jaundice.  HEENT:  Sclerae nonicteric.  Oropharyngeal mucosal moist and pink.  No  lesions, erythema, or exudate.  NECK:  No lymphadenopathy, thyromegaly.  CHEST:  Lungs are clear to auscultation.  CARDIAC:  Regular rate and rhythm.  Normal S1 and S2.  No murmurs, rubs, or  gallops.  ABDOMEN:  Positive bowel sounds.  Obese but symmetrical.  Soft and  nontender.  No organomegaly or  masses appreciated.  EXTREMITIES:  No edema.   LABORATORY DATA:  As in HPI.  In addition, hemoglobin 13, hematocrit 39,  platelets 169,000.  Sodium 136, potassium 4, BUN 10, creatinine 0.8.   IMPRESSION:  The patient is a 65 year old Caucasian female who presented  with acute onset mid-abdominal pain associated with nausea and vomiting.  On  abdominal ultrasound, there is evidence of gallbladder sludge but no  discrete stones.  She does have some small perihepatic ascites.  Computed  tomography scan revealed a distended gallbladder.  It is unclear at this  time if the gallbladder findings are significant.  She also has minimally  dilated proximal small bowel loops with mild wall thickening and surrounding  infiltrative changes.  This is possibly due to infectious enteritis or  partial small-bowel obstruction.  Clinically, she appears to be doing better  at this point.  She has had multiple bowel movements post-contrast, and  bowel sounds are normal.  No evidence of significant abdominal distension.  Slight prominence of the gastric wall at the cardia on computed tomography  scan, although this is possibly a false finding given the stomach was not  completely distended on the study.   RECOMMENDATIONS:  1.  Will review CT films.  She may need to have small-bowel follow-through      and possibly upper GI series.  2.  Further recommendations to follow.      Tana Coast, P.A.      Lionel December, M.D.  Electronically Signed    LL/MEDQ  D:  03/07/2005  T:  03/07/2005  Job:  [id number]   cc:   Donna Bernard, M.D.  Fax: [phone]

## 2010-05-31 NOTE — Consult Note (Signed)
[name], [name] NO.:  [id number]   MEDICAL RECORD NO.:  [id number]          PATIENT TYPE:  INP   LOCATION:  A222                          FACILITY:  [facility]   PHYSICIAN:  [place] Bing, M.D.  DATE OF BIRTH:  [birth year]   DATE OF CONSULTATION:  12/25/2003  DATE OF DISCHARGE:  12/27/2003                                   CONSULTATION   REFERRING PHYSICIAN:  Dr. Lubertha South.   PRIMARY CARDIOLOGIST:  Dr. Dietrich Pates   HISTORY OF PRESENT ILLNESS:  A 65 year old woman with a history of diabetes  and hypertension admitted for apparent congestive heart failure.  [name]  has no known coronary disease.  She was evaluated in February 2004 for chest  pain, at which time a stress Cardiolite study revealed normal myocardial  perfusion and normal left ventricular function.  She had had a prior  Cardiolite in 1999, which was also negative.  She had been doing well in  recent months until developing some nocturnal wheezing for the past 2 weeks.  During the past few days, she has experienced increase in dyspnea on  exertion, such that she was dyspneic at rest when she presented for  evaluation.  She also described symptoms compatible with orthopnea.  She has  had substantial pedal edema, but this has resolved rapidly with leg  elevation.  She has not had any chest discomfort.  She notes no diaphoresis  nor nausea.  Since receiving her first dose of furosemide in the hospital,  she is significantly improved.   She has had longstanding diabetes treated with insulin.  She has a history  of hypertension that has been well-controlled.  Past surgical history  includes two operations on lumbar spine for low back pain.   ALLERGIES:  1.  VALIUM.  2.  DEMEROL.  3.  TYLOX.  4.  PERCOCET.   RECENT MEDICATIONS:  1.  Lovastatin.  2.  Lantus insulin.  3.  Metformin.  4.  Avandia.  5.  Prandin.  6.  Fish oil.  7.  Aspirin.  8.  Toprol 200 mg daily.  9.  Zestril 40 mg  daily.  10. Furosemide, typically 20 mg daily but recently increased to 40 mg.  11. Hydralazine 50 mg daily.   SOCIAL HISTORY:  Lives in [place] with her husband; not employed outside  the home.  Has three children and three grandchildren.  Denies the use of  tobacco products or significant use of alcohol.   FAMILY HISTORY:  Markedly  positive including coronary disease in her  mother, her father, and four brothers.  She also has two sisters who do not  have known cardiac disease.   REVIEW OF SYSTEMS:  Notable for intermittent numbness in the hands and arms.  She has had some wheezing and cough in recent weeks.  Other systems reviewed  and are negative.   PHYSICAL EXAMINATION:  GENERAL:  Pleasant, overweight woman in no acute  distress.  VITAL SIGNS:  Temperature 97.4, heart rate 64 and regular, respirations 20,  blood pressure 150/70, weight 206.  HEENT:  Anicteric sclerae; normal  conjunctivae.  NECK:  Mild jugular venous distension; normal carotid upstrokes without  bruits.  Neck is supple and fully mobile.  ENDOCRINE:  No thyromegaly.  HEMATOPOIETIC:  No cervical, axillary, or inguinal adenopathy.  LUNGS:  Good air movement; minimal rales at the left base - essentially  clear.  CARDIAC:  Normal 1st and 2nd heart sounds; no 3rd heart sound; grade 2/6  systolic ejection murmur at the base.  ABDOMEN:  Soft and nontender; no organomegaly.  EXTREMITIES:  1+ edema bilaterally; distal pulses intact.  NEUROMUSCULAR:  Symmetric strength and tone.  MUSCULOSKELETAL:  No joint deformities.   Chest x-ray:  Borderline cardiomegaly; dilatation of the azygos vein; no  pulmonary edema.   EKG:  Normal sinus rhythm; left axis; poor R wave progression; no change  from her prior tracing of February 2004.   Other laboratory notable for minimal edema with a hemoglobin of 11.6 and a  normal MCV, normal renal function, and normal cardiac markers.   IMPRESSION:  [name] had normal left  ventricular systolic function as  recently as a year and a half ago and has no known cardiomyopathy or other  myocardial disorder.  An echocardiogram will be key to determine if she has  developed same.  If not, the presumptive diagnosis is diastolic dysfunction  leading to very mild congestive heart failure.  She does not have a very  impressive exam or chest x-ray.  Alternative diagnoses should be considered.  A D-dimer level will be obtained as well as BNP level.  Diuresis will be  continued with monitoring of electrolytes.  It is possible that her  medications contributed to fluid retention, especially Avandia, which will  be held.   Hypertension is relative well-controlled.  Medication can be increased as  necessary.  Low dose enoxaparin will be provided to prophylaxis against deep  venous thrombosis.  Further evaluation of her minimal anemia can be  considered while she is in the hospital.   We greatly appreciate the referral of this nice woman and will be happy to  follow her with you, both in the hospital and subsequent discharge.     [place]obe   RR/MEDQ  D:  12/27/2003  T:  12/27/2003  Job:  [id number]

## 2010-05-31 NOTE — Procedures (Signed)
   NAME:  PAIZLEIGH, WILDS                         ACCOUNT NO.:  [id number]   MEDICAL RECORD NO.:  [id number]                   PATIENT TYPE:  INP   LOCATION:  A201                                 FACILITY:  [facility]   PHYSICIAN:  Vida Roller, M.D.                DATE OF BIRTH:  1945-07-11   DATE OF PROCEDURE:  02/25/2002  DATE OF DISCHARGE:                                    STRESS TEST   INDICATION:  The patient is a 65 year old female with no known coronary  artery disease who presented with complaint of chest discomfort associated  with palpitations concerning for cardiac ischemia.  She has some minimal EKG  changes which are concerning for ischemia.  She has had negative cardiac  enzymes.   BASELINE DATA:  EKG: Sinus rhythm at 64 beats per minute with nonspecific ST  changes and poor R wave progression.  Blood pressure 120/72.   The patient exercised for a total of 5 minutes and 29 seconds under the  Bruce protocol stage II.   STRESS DATA:  Maximum heart rate: 151  Maximum blood pressure: 210/90  EKG: No arrhythmia, no ischemic changes.  SYMPTOMS:  Shortness of breath which resolved in recovery.   Final images and results are pending MD review.      Amy Mercy Riding, P.A. [facility]                     Vida Roller, M.D.    AB/MEDQ  D:  02/25/2002  T:  02/25/2002  Job:  [id number]

## 2010-05-31 NOTE — Procedures (Signed)
[name], [name]               ACCOUNT NO.:  [id number]   MEDICAL RECORD NO.:  [id number]          PATIENT TYPE:  INP   LOCATION:  A226                          FACILITY:  [facility]   PHYSICIAN:  Scott A. Gerda Diss, MD    DATE OF BIRTH:  04/16/1945   DATE OF PROCEDURE:  DATE OF DISCHARGE:  12/10/2005                              EKG INTERPRETATION   INDICATIONS FOR PROCEDURE:  The patient with sinus bradycardia.   FINDINGS:  No acute changes noted.  Does have some atrial septal changes  consistent with infarct, age undetermined.   IMPRESSION:  Abnormal EKG.      Scott A. Gerda Diss, MD  Electronically Signed     SAL/MEDQ  D:  01/16/2006  T:  01/16/2006  Job:  [id number]

## 2010-05-31 NOTE — Discharge Summary (Signed)
   NAME:  [name], [name]                         ACCOUNT NO.:  [id number]   MEDICAL RECORD NO.:  [id number]                   PATIENT TYPE:  INP   LOCATION:  A201                                 FACILITY:  [facility]   PHYSICIAN:  Donna Bernard, M.D.             DATE OF BIRTH:  13-Jul-1945   DATE OF ADMISSION:  02/24/2002  DATE OF DISCHARGE:  02/25/2002                                 DISCHARGE SUMMARY   FINAL DIAGNOSES:  1. Chest pain.  2. Type 2 diabetes.  3. Hypertension.  4. Hyperlipidemia.   DISPOSITION:  1. The patient discharged home.  2. Follow up in the office in one to two weeks.  3. Maintain usual medications.   INITIAL HISTORY AND PHYSICAL:  Please see H&P that was dictated.   HOSPITAL COURSE:  This patient is a 65 year old white female with  longstanding history of hypertension, type 2 diabetes, and hyperlipidemia  who presented to the office after a one year hiatus.  She notes she now  receives her primary health care through [facility] where her medicines are  provided free of charge.  The patient noted chest discomfort, deep  substernal in nature.  It was accompanied by some shortness of breath with  no nausea or diaphoresis.  Due to her many risk factors the patient was  admitted to the hospital.  Her resting EKG showed nonspecific changes but  none diagnostic.  Cardiac enzymes were done in a serial fashion and proved  to be negative.  The cardiologists were consulted.  They recommended doing a  stress Cardiolite test.  The patient tolerated the stress well.  The results  came back negative.  With negative results and the patient feeling better,  along with stable EKG, cardiac enzymes, and telemetry, it was felt that the  patient could go home with the diagnoses and disposition as noted above.                                               Donna Bernard, M.D.    Karie Chimera  D:  04/05/2002  T:  04/06/2002  Job:  [id number]

## 2010-05-31 NOTE — Consult Note (Signed)
[name], [name] NO.:  [id number]   MEDICAL RECORD NO.:  [id number]          PATIENT TYPE:  INP   LOCATION:  A226                          FACILITY:  [facility]   PHYSICIAN:  Peter C. Eden Emms, MD, FACCDATE OF BIRTH:  07/05/1945   DATE OF CONSULTATION:  12/09/2005  DATE OF DISCHARGE:                                 CONSULTATION   REASON FOR CONSULTATION:  [name] is a 65 year old patient of Dr.  Gerrit Friends. Rothbart's whom we were asked to see for congestive heart  failure and shortness of breath.   HISTORY OF PRESENT ILLNESS:  The patient has a history of hypertension,  type 2 diabetes mellitus and hyperlipidemia.  She has a distant history  about four years ago of  diastolic congestive heart failure.  I do not  have the details of her previous workup.  The patient has noted that for  the last four to five days increasing shortness of breath.  She has had  PND and orthopnea.  She feels that she has had increasing lower  extremity edema.  She was seen by Dr. Gerda Diss in the office.  She  appeared to have some cardiac wheezing.  She was admitted to the  hospital and has had improvement with IV diuresis. She has not had any  significant sputum production or fever.  She has had a bit of a cough.   REVIEW OF SYSTEMS:  The patient's review of systems is otherwise  remarkable for no history of pulmonary embolus or deep venous  thrombosis.  She is morbidly obese.  There is no history of asthma.  She  is a non-smoker.  The patient has no history of coronary artery disease  and has not had a previous myocardial infarction or documentation of  decreased systolic function.  The review of systems is otherwise  noncontributory.   The patient's risk factors are followed closely by Dr. Gerda Diss, and  apparently she is in the ACCORD study, which manages her hypertension  and diabetes.   PAST SURGICAL HISTORY:  1. Ruptured disk in 2002.  2. She had a colonoscopy in 2002, which  was negative.   FAMILY HISTORY:  Remarkable for hypertension and diabetes, coronary  artery disease and hyperlipidemia.   SOCIAL HISTORY:  The patient is married.  She lives locally and does not  use alcohol.  She does not smoke.   ALLERGIES:  LESCOL.   CURRENT MEDICATIONS:  1. Are extensive.  She uses Lantus 90 units at night.  2. Aspartate insulin sidling scale.  3. Metformin 1 gram in the morning, 500 mg at lunch.  4. Prandin 2 mg p.r.n.  5. Lasix 40 mg in the morning.  6. Hydralazine 50 mg t.i.d.  7. Lotrel 5/20 mg.  8. Toprol 200 mg q.d.  9. An aspirin a day.  10.Mevacor.   PHYSICAL EXAMINATION:  VITAL SIGNS:  Blood pressure currently 135/78.  She is in a sinus rhythm at a rate of 78.  GENERAL:  She is morbidly obese.  HEENT:  Normal.  NECK:  No lymphadenopathy, no thyromegaly.  LUNGS:  Currently clear.  JVP is not visible.  HEART:  There is an S1, S2.  Normal heart sounds.  ABDOMEN:  Protuberant but not tender.  There is no hepatosplenomegaly.  EXTREMITIES:  Distal pulses intact.  She currently  has no edema.  NEUROLOGIC:  Nonfocal.  SKIN:  Warm and dry.   Electrocardiogram shows a sinus rhythm, nonspecific ST-T-wave changes  and poor R-wave progression.  There are no acute changes.   LABORATORY DATA:  Fairly unremarkable.  Beta natriuretic peptide was not  drawn.  Hematocrit 39.2, platelets 203.  Sodium 141, potassium 4.3,  creatinine normal at 0.6.  She has three negative sets of enzymes.  Her  liver function tests are normal.   A chest x-ray shows mild redistribution, possibly mild congestive  failure.   IMPRESSION:  The patient probably did have some mild volume overload.  A  beta natriuretic peptide would be reasonable to check.  She will have a  2-dimensional echocardiogram to further assess her left ventricular  function.   RECOMMENDATIONS:  I suspect that she will be able to be discharged  tomorrow.  It may be reasonable to change her Lasix to 40 mg in  the  morning and 20 mg in the afternoon and follow her.  I talked to the  patient at length regarding adjusting her Lasix, based on her weight at  home and her symptoms.  She clearly had classic PND, and the patient now  knows that she can increase her Lasix while she calls Dr. Gerda Diss to try  to avoid hospitalization.   FOLLOWUP:  The patient can follow up with Dr. Gerrit Friends. Rothbart in the  next week or two as an outpatient for a Myoview study, given her  multiple risk factors.  I think that ongoing vigilance to rule out  coronary artery disease is in order.      [name]. Eden Emms, MD, [facility]  Electronically Signed     PCN/MEDQ  D:  12/09/2005  T:  12/09/2005  Job:  [id number]

## 2010-05-31 NOTE — H&P (Signed)
[name], [name] NO.:  [id number]   MEDICAL RECORD NO.:  [id number]          PATIENT TYPE:  INP   LOCATION:  A303                          FACILITY:  [facility]   PHYSICIAN:  Donna Bernard, M.D.DATE OF BIRTH:  05-19-1945   DATE OF ADMISSION:  03/05/2005  DATE OF DISCHARGE:  LH                                HISTORY & PHYSICAL   CHIEF COMPLAINT:  Abdominal pain, vomiting, possible fever.   HISTORY OF PRESENT ILLNESS:  This patient is a 65 year old white female with  history of hypertension, type 2 diabetes mellitus, hyperlipidemia, history  of known diverticulosis on remote colonoscopy who presented to the office  the day of admission with acute complaints. The patient was in her usual  state of mildly complicated, but chronically stable health when she started  developing upper mid abdominal pain.  This was the evening prior to  admission. This was associated with nausea, through the night the patient  had pretty significant pain at times. At times it had a very severe crampy-  like nature.  She did have a couple of episodes of vomiting and after  vomiting she seemed to get somewhat better for a while.  The patient noted a  little dizziness at times. She had no blood in stools.  Her bowel movements  have been normal up to yesterday. She has not had a normal bowel movement  yet.  The patient's noted a low grade fever.  She has felt chills at times.  She has had no significant urinary symptoms.  The patient notes her pain  primarily is in the epigastric area, primarily the upper mid abdomen.  The  patient gives no history of ulcers.  She claims compliance with her chronic  medications.   MEDICATIONS:  Her chronic medications include:  1.  Prandin 2 mg before lunch.  2.  Metformin 1000 mg in the morning and for supper and 500 mg at bedtime.  3.  Toprol-XL 200 mg daily.  4.  Hydralazine 50 mg one t.i.d.  5.  Enteric coated aspirin 81 mg daily.  6.  Lantus 68  units q.h.s.  7.  Lispro insulin sliding scale before meals.  8.  Lasix 40 mg each morning.  9.  Mevacor 40 mg q.h.s.  10. K-Tab 20 mEq daily.   FAMILY HISTORY:  Significant for hypertension, diabetes, coronary artery  disease, hyperlipidemia.   ALLERGIES:  LESCOL.   SOCIAL HISTORY:  The patient is married, three children, retired, no tobacco  or alcohol use.   PAST SURGICAL HISTORY:  Disc surgery March 2002.  Last tetanus shot 2005.   REVIEW OF SYSTEMS:  Otherwise negative.   PHYSICAL EXAMINATION:  VITAL SIGNS:  Temperature 99.2, blood pressure  136/82, pulse 90, weight 198.  GENERAL APPEARANCE:  Patient is alert, in some distress, holding her upper  abdomen during periods of pain.  HEENT:  Pupils equal, round, reactive to light and accommodation, normal.  Hydration fair.  NECK:  Supple.  LUNGS:  Clear.  HEART:  Regular rate and rhythm, no significant murmurs.  ABDOMEN:  Moderate epigastric tenderness,  no rebound, no guarding. No  masses.  Rectal examination with Heme negative stool.  No stool in the  vault.  EXTREMITIES:  Thin, no significant edema.  NEUROLOGICAL:  Examination intact.   LABORATORY DATA:  Significant labs include chest x-ray with cardiomegaly  with some slight bronchitic changes.  Abdominal upright showed questionable  minimal thickening of the small bowel within the left mid abdomen.  Liver  enzymes with albumin slightly low 3.2, otherwise normal and amylase and  lipase are normal.  CBC with white blood cell count 14,500 with left shift.  MET7 normal other than slight elevation of glucose.  Calcium also borderline  elevated at 11.1.   IMPRESSION:  Rather sudden onset of epigastric pain, tenderness, vomiting,  elevated white blood cell count, Hemoccult negative stool.  Patient does not  appear in extremis but is in distress with discomfort.  We did a gallbladder  ultrasound that showed sludging but no thickening of the wall and no  inflammation.  The  pain does have colicky features to it at times.   PLAN:  Intravenous fluids, intravenous antibiotics, sliding scale insulin,  maintain blood pressure medications, CT scan of the abdomen tomorrow.   Diverticulitis is the highest on the list for working diagnoses but other  etiologies need to be considered.  Further orders as noted in the chart.      Donna Bernard, M.D.  Electronically Signed     WSL/MEDQ  D:  03/05/2005  T:  03/06/2005  Job:  [id number]

## 2010-05-31 NOTE — H&P (Signed)
NAMEPRUDY, CANDY NO.:  [id number]   MEDICAL RECORD NO.:  [id number]          PATIENT TYPE:  INP   LOCATION:  A226                          FACILITY:  [facility]   PHYSICIAN:  Donna Bernard, M.D.DATE OF BIRTH:  1945/09/30   DATE OF ADMISSION:  12/08/2005  DATE OF DISCHARGE:  LH                              HISTORY & PHYSICAL   CHIEF COMPLAINT:  Shortness of breath, chest pain and swollen ankles.   SUBJECTIVE:  This patient is a 65 year old white female with a history  of hypertension, type 2 diabetes mellitus, hyperlipidemia, congestive  heart failure felt to be diastolic in nature, who presents to the office  the day of admission with acute concerns.  The patient notes for the  last several days she has developed shortness of breath which is worse  at night.  It hits per particularly hard when she tries to lay flat.  In  addition, the patient has noted wheezing in the evening time.  Wheezing  has never been a problem for her.  She has also noted swelling in her  ankles, and, in addition to that, she has a sense of fullness and  tightness in her chest.  There is associated shortness of breath, though  no nausea or diaphoresis.  Of note, the patient was worked up for CHF a  couple years ago with no evidence of coronary ischemia at that time.  Her CHF was felt to be likely diastolic, though her echo results were  suboptimal.  The patient is followed by the ACCORD study which manages  her hypertension and type 2 diabetes.  We manage her hyperlipidemia, and  her recent LDL was down in the range of 46.   PRIOR SURGERIES:  1. Ruptured disk in 2002 with surgery.  2. Negative colonoscopy in 2002 with repeat needed in 10 years.   FAMILY HISTORY:  Significant for hypertension, diabetes, coronary artery  disease, hyperlipidemia.   SOCIAL HISTORY:  The patient is married.  No alcohol abuse.  No smoking  abuse.  Three children.   ALLERGIES:  LESCOL caused  tenesmus.   CURRENT MEDICATIONS:  The patient claims compliance with her current  medications which include:  1. Lantus recently increased to 90 units q.h.s.  2. Aspart insulin sliding scale.  3. Metformin 1000 mg q.a.m. and q.p.m., 500 mg at lunch time.  4. Prandin 2 mg p.o. p.r.n. when not using aspart insulin.  5. Lasix 40 mg q.a.m.  6. Hydralazine 50 mg t.i.d.  7. Lotrel 5/20 one daily.  8. Toprol XL 200 mg one daily.  9. Enteric-coated aspirin 81 mg one daily.  10.Mevacor 40 mg q.h.s.  (The patient claims compliance with all these medications).   REVIEW OF SYSTEMS:  Otherwise negative.   PHYSICAL EXAMINATION:  VITAL SIGNS:  Blood pressure 140/86, weight 205.  The patient is alert, in no acute distress.  HEENT: Normal.  NECK:  Supple.  JVD hard to assess due to large neck.  LUNGS:  Basilar crackles.  No wheezes currently.  HEART:  Regular rate and rhythm.  CHEST WALL:  Nontender.  ABDOMEN:  Soft.  No tenderness.  EXTREMITIES:  Ankles with 1+ edema.  Some venous stippling noted.   ELECTROCARDIOGRAM:  Nonspecific ST-T changes.   LABORATORIES:  Pending.   IMPRESSION:  1. Exacerbation of CHF with wheezing at night and orthopnea, likely      cardiac wheezing.  This, along with the swollen ankles, is      concerning for an exacerbation of CHF.  This may well be _________      her diastolic dysfunction acting up; however, with the sense of      chest fullness and her multiple risk factors, she is at risk for an      ischemic presentation superimposed on her chronic diastolic      failure.  Based on this, I think she needs to be in the hospital.      Rationale discussed with the patient.  2. Type 2 diabetes, clinically stable.  3. Hyperlipidemia.  4. Hypertension.   PLAN:  Admit for IV Lasix, cardiology consult, echocardiogram.  Maintain  other medications, other than Glucophage, sliding scale, CCNT.  Further  orders as noted on the chart.      Donna Bernard,  M.D.  Electronically Signed     WSL/MEDQ  D:  12/08/2005  T:  12/09/2005  Job:  [id number]

## 2010-05-31 NOTE — H&P (Signed)
NAMEKEILY, LEPP NO.:  [id number]   MEDICAL RECORD NO.:  [id number]          PATIENT TYPE:  INP   LOCATION:  A222                          FACILITY:  [facility]   PHYSICIAN:  Donna Bernard, M.D.DATE OF BIRTH:  [birth year]   DATE OF ADMISSION:  12/25/2003  DATE OF DISCHARGE:  LH                                HISTORY & PHYSICAL   CHIEF COMPLAINT:  Shortness of breath.   HISTORY OF PRESENT ILLNESS:  The patient arrived at the office on the day of  admission with complaints of acute shortness of breath over the past several  days accompanied by swelling in the legs and ankles. The patient has a long-  standing history of hypertension, type 2 diabetes and hyperlipidemia. She  had noted occasional transient shortness of breath with exertion over the  past few months but several days ago the patient had progressive swelling in  her legs and ankles accompanied by a sense of shortness of breath. The  patient also noted significant swelling.  She claimed no significant chest  pain although at times she had a nonspecific full sensation in her chest.  She had no nausea, no diaphoresis. The patient claims compliance with her  current medications which include:  1.  Prandin 2 mg before each meal.  2.  Avandia 4 mg b.i.d.  3.  Metformin 1000 mg q.a.m. and q.p.m., 500 mg q.h.s.  4.  Toprol 200 mg daily.  5.  Zestril 40 mg daily.  6.  Aspirin 81 mg daily.  7.  Hydralazine 50 mg t.i.d.  8.  Lantus 42 units q.h.s.  9.  Lasix 40 mg 1/2 daily.  10. Mevacor 20 mg daily.   PAST SURGICAL HISTORY:  Remote back surgery for ruptured disk.   FAMILY HISTORY:  Positive for hypertension, diabetes and a strong family  history of coronary artery disease including a sibling and her father.   ALLERGIES:  No true allergies.   SOCIAL HISTORY:  The patient is retired. No smoking history, no alcohol  history. She does have three children.   REVIEW OF SYSTEMS:  Otherwise negative.   PHYSICAL EXAMINATION:  VITAL SIGNS:  Blood pressure 140/90.  GENERAL:  The patient is alert, in no acute distress.  HEENT:  Pharynx normal.  NECK:  No jugular venous distension noted.  LUNGS:  Basilar crackles heard, no tachypnea.  HEART:  Regular rate and rhythm. No chest wall tenderness.  ABDOMEN:  Exam benign.  EXTREMITIES:  Edema 2+ in both legs. Hands without obvious edema.   LABORATORY DATA:  Chest x-ray shows increased fluid in the azygos vein with  new onset cardiomegaly. Cardiac enzymes negative. Chem-7 normal. CBC:  Hemoglobin borderline low at 11.5 with normocytic indices.   IMPRESSION:  Relatively acute onset of shortness of breath accompanied by  edema and chest x-ray showing probable increased fluid.  The patient is on  Avandia but she has a multitude of risk factors which could set her up for  silent ischemia and therefore a congestive heart failure from this.  1.  Type 2 diabetes.  2.  Hyperlipidemia.  3.  Hypertension.  4.  Strong family history of heart disease.   PLAN:  Admit patient for serial cardiac enzymes. Cardiology consultation.  Echocardiogram.  IV Lasix.  Further orders as noted in the chart.     Kristine Royal   WSL/MEDQ  D:  12/26/2003  T:  12/26/2003  Job:  [id number]

## 2010-05-31 NOTE — Op Note (Signed)
[place]. [facility]  Patient:    [name], [name]                      MRN: [id number] Proc. Date: 06/26/00 Adm. Date:  [id number] Attending:  Donalee Citrin P                           Operative Report  PREOPERATIVE DIAGNOSIS:  Recurrent L5-S1 ruptured disk and left S1 radiculopathy.  PROCEDURE:  Redo lumbar hemilaminectomy and microdiskectomy at L5-S1 with resection of recurrent disk and microscopic dissection of the left S1 nerve root.  SURGEON:  Garlon Hatchet., M.D.  FIRST ASSISTANT:  Reinaldo Meeker, M.D.  CLINICAL HISTORY:  The patient is a very pleasant 65 year old female who is four months status post lumbar laminectomy for L5-S1 ruptured disk and a left S1 radiculopathy.  She was doing very well up until about three weeks ago, when she was climbing some steps and the next day felt severe-onset back and left leg pain as before, radiating down to the bottom of her foot and the outside of her foot.  Preoperative imaging revealed a large recurrent disk rupture at L5-S1, compressing the left S1 nerve root.  The patient was extensively counseled of the risks and benefits of surgery and decided to proceed forward with redo lumbar laminectomy.  DESCRIPTION OF PROCEDURE:  The patient was brought in the OR, was induced under general anesthesia, positioned prone on the Wilson frame.  Her back was prepped and draped in the routine sterile fashion.  Her old incision was infiltrated with 10 cc of lidocaine with epinephrine, and a 10 blade scalpel was used to make the incision.  Bovie electrocautery was used to take it down through the subcutaneous tissue, and subperiosteal dissection was carried out to reveal the laminae of L5 and S1 and the medial aspect of the facet complex at L5-S1.  Intraoperative x-ray confirmed localization of the L5-S1 interspace.  The scar was then resected with a 4 Penfield and a 1 Penfield, and the margins of the scar tissue and the  residual of L4 and the medial aspect of the facet complex were dissected free using a 2 and 3 mm Kerrison punch.  The medial facetectomy was extended a little bit underneath the lamina of L5 to develop a plane, and then using a 4 Penfield, the remainder of this plane was developed down to the superior aspect of S1 and the S1 neural foramen.  At this time the operating microscope was draped and brought into the field.  Under microscope illumination, the remainder of the S1 nerve root was opened up after careful dissection of the scar tissue off the roof of S1 and the superior aspect of the S1 lamina.  Then the 4 Nicholos Johns was used to dissect the nerve root off the pedicle, and then the large, partially calcified recurrent disk rupture was appreciated underneath the nerve root. This was teased away with a 4 Penfield and a nerve hook, and a DErrico nerve root retractor was used to reflect the S1 nerve root medially.  Then using an 11 blade scalpel, repeat annulotomy was partially made, and a 2 mm Kerrison punch was used to remove part of the calcified fragment.  Then the remainder of the interspace was cleaned out with pituitary rongeurs.  There were several large fragments of disk that were teased away from underneath scar tissue and underneath the thecal sac  with the nerve hook and upgoing pituitaries and Epstein curette.  At the end of the diskectomy, there were no further fragments appreciated or obtained from the disk space, and the thecal sac was completely decompressed and explored with a coronary dilator as well as a hockey stick, and no further compression was noted.  The wound was copiously irrigated, and meticulous hemostasis was maintained.  Gelfoam was overlaid on top of the dura.  The fascia was closed with 0 interrupted Vicryl, the subcutaneous tissue closed with 2-0 interrupted Vicryls, and the skin was closed with a running 4-0 subcuticular.  Benzoin and Steri-Strips  were applied.  The patient went to the recovery room in stable condition.  At the end of the case, all needle counts and sponge counts were correct. DD:  06/26/00 TD:  06/26/00 Job: [id number]

## 2010-05-31 NOTE — Discharge Summary (Signed)
[name], HARTGROVE NO.:  [id number]   MEDICAL RECORD NO.:  [id number]          PATIENT TYPE:  INP   LOCATION:  A226                          FACILITY:  [facility]   PHYSICIAN:  Donna Bernard, M.D.DATE OF BIRTH:  08/12/1945   DATE OF ADMISSION:  12/08/2005  DATE OF DISCHARGE:  11/28/2007LH                               DISCHARGE SUMMARY   FINAL DIAGNOSES:  1. Acute congestive heart failure  2. Type 2 diabetes.  3. Hyperlipidemia.  4. Myocardial infarction ruled out,  5. Hypertension.  6. History of diastolic congestive heart failure.   FINAL DISPOSITION:  1. Patient discharged to home.  2. Patient maintain same meds except increased the Lasix to 40 mg each      morning and one half of 40 in early afternoon.   FOLLOWUP:  1. Dr. Dietrich Pates for a stress test in 7-10 days.  2. Follow up in our office in one to two weeks.   INITIAL HISTORY AND PHYSICAL:  Please see H&P as dictated.   HOSPITAL COURSE:  This patient is a 65 year old female with a history of  multiple chronic medical problems including hypertension, type 2  diabetes, and diabetes mellitus who had in the past presented with  diastolic congestive heart failure.  She presented to the office with  shortness of breath and sense of wheezing.  Her exam showed crackles in  the base and she had very significant orthopnea.  Due to her multiple  risk factors for potential ischemic exacerbation of CHF, the patient was  admitted to the hospital.  She was diuresed with IV Lasix.  Serial  cardiac enzymes proved negative.  We covered her with therapeutic levels  of Lovenox.  Cardiology folks were consulted.  Ejection fraction was  60%, so therefore she was still felt to be diastolic.  The patient's  fluid cleared up nicely.  Cardiologist likely thought it was not  ischemic in nature but recommended a stress test and followup.  On day  of discharge, the patient and breathing easier, exam was stable, and she  was  discharged home with diagnosis and disposition as noted above.     Donna Bernard, M.D.  Electronically Signed    WSL/MEDQ  D:  02/26/2006  T:  02/26/2006  Job:  [id number]

## 2010-05-31 NOTE — Op Note (Signed)
[place]. [facility]  Patient:    [name], [name]                        MRN: [id number] Proc. Date: 03/04/00 Adm. Date:  [id number] Attending:  Donalee Citrin P                           Operative Report  PREOPERATIVE DIAGNOSIS:  S1 radiculopathy, left secondary to ruptured disk, L5-S1, left.  POSTOPERATIVE DIAGNOSIS:  S1 radiculopathy, left secondary to ruptured disk, L5-S1, left.  OPERATION PERFORMED:  Lumbar microdiskectomy, L5-S1 left microscope ____________ left S1 nerve root and microdiskectomy L5-S1 interspace on the left.  SURGEON:  Donalee Citrin, Montez Hageman.  ASSISTANT:  Julio Sicks, M.D.  ANESTHESIA:  General endotracheal.  ESTIMATED BLOOD LOSS:  Less than 100 cc.  INDICATIONS FOR PROCEDURE:  The patient is a very pleasant 65 year old female who has had progressive worsening back and left leg pain that radiates down the back of her leg to the left heel.  She had failed conservative treatment with anti-inflammatories and therapy.  The pain has become progressively worse and preoperative imaging revealed a large ruptured disk with free fragment migrating caudal from the interspace compressing the left S1 nerve root.  I think that secondary to the size of the fragment and her degree of discomfort and failure of conservative treatment I recommended surgical excision to the patient and extensively went over the risks and benefits of surgery.  She understands and agreed to proceed.  DESCRIPTION OF PROCEDURE:  The patient was brought to the operating room and was induced under general anesthesia.  She was placed prone on the Wilson frame and her back was prepped and draped in the usual sterile fashion. Preoperative x-ray confirmed needle marker over the L5-S1 interspace.  The wound was infiltrated with 1% lidocaine with epinephrine.  A twin blade scalpel was used to incise the skin ____________ electrocautery taken down to subcutaneous tissues.  Subperiosteal  ____________ fascia divided on patients left side and subperiosteal dissection was carried out on the L5 and S1 lamina on patients left side.  Self-retaining retractor was placed.  Intraoperative x-ray confirmed the L5-S1 interspace.  Then using a 3 and 4 mm Kerrison punch remainder of the inferior lamina of  L5 was removed as well as the medial facet and superior lamina of S1.  The ligamentum flavum was removed in piecemeal fashion and the dura was visualized.  The S1 nerve root was identified.  Then the operating microscope was draped and brought into the field.  Under microscope illumination the remainder of the S1 nerve root was decompressed and  tracked out the foramen.  Then under microscope illumination with the Penfield S1 nerve root was attempted to be mobilized off of a large fragment underneath it.  This was not able to be performed.  Then attention was taken to the axilla of the S1 nerve root.  The veins were coagulated using a nerve hook.  The axilla was opened up and a large free fragment disk was teased out from within the axilla using a combination of nerve  hook and pituitary rongeurs.  Two very large ____________ fragments were removed from the axilla and the S1 root was mobilized and freed up and ____________ refelclted medially.  The interspace was identified.  Epidural veins were coagulated.  ____________ 11 blade scalpel and pituitary rongeurs used to clean out the interspace using combination of  downgoing Epstein curets and pituitary rongeurs ____________ interspace was cleaned out.  Then attention was taken back to the axilla.  Another small fragment was able to be teased out and removed.  Then using a hockey stick and a blunt nerve hook the remainder of the axilla was explored.  No further compressive fragment was appreciated.  The hockey stick was used to explore the foramen and the thecal sac medially cephalad ____________ no further disk herniations  appreciated. Interspace was cleaned out. Wound was copiously irrigated.  Meticulous hemostasis was maintained.  Gelfoam was overlaid top of the dura.  Fascia was closed using 0 interrupted Vicryls.  Subcutaneous tissues closed with 2-0 interrupted Vicryls.  Skin closed with running 4-0 subcuticular.  Benzoin and Steri-Strips applied.  Patient recovery room stable condition.DD:  03/04/00 TD:  03/04/00 Job: [id number]

## 2010-05-31 NOTE — Letter (Signed)
January 27, 2006    W. Simone Curia, M.D.  [address]  [place], [place] [address]   RE:  [name], [name]  MRN:  [id number]  /  DOB:  06/27/1945   Dear Brett Canales:   [name] returns to the office for continued assessment and treatment  of congestive heart failure with preserved left ventricular systolic  function. She underwent a stress nuclear study. Her electrocardiographic  response was consistent with ischemia, but her images were entirely  normal suggesting that the EKG findings represent a false positive. She  has done well since her last visit with class II exertional dyspnea and  no orthopnea nor PND. Weights have been stable. She has had minimal, if  any, peripheral edema.   CURRENT MEDICATIONS:  1. Lantus insulin.  2. Hydralazine 50 mg t.i.d.  3. Toprol 200 mg daily.  4. Furosemide 40 mg a.m. and 20 mg p.m.  5. Fish oil 3 capsules per day.  6. Lotrel 5/20 mg daily.  7. Lovastatin 40 mg daily.  8. Prandin 2 mg daily.  9. Metformin 2500 mg per day in divided doses.  10.NovoLog per serum glucose.   PHYSICAL EXAMINATION:  GENERAL:  Overweight pleasant woman in no acute  distress.  VITAL SIGNS:  The weight is 206, 2 pounds more than 1 month ago. Blood  pressure 135/70, heart rate 66 and intermittently irregular.  Respirations 16.  NECK:  No jugular venous distention; normal carotid upstrokes without  bruits.  LUNGS:  Clear.  CARDIAC:  Normal first and second heart sounds; minimal basilar systolic  ejection murmur.  ABDOMEN:  Soft and nontender; no organomegaly.  EXTREMITIES:  1/2+ ankle and pretibial edema.   RHYTHM STRIP:  Normal sinus rhythm; borderline first degree AV block; no  arrhythmia identified with 2 minutes of monitoring.   Her recent chemistry profile is entirely normal. Potassium was 3.9, BUN  14 and creatinine 0.8.   IMPRESSION:  [name] is doing well with diuretics alone. Presumably,  her congestive heart failure is related to diastolic  dysfunction. She  has no valvular abnormalities identified nor does she apparently have  significant coronary artery disease. Renal artery stenosis would be a  consideration, but is more likely to occur with a degree of renal  insufficiency. For now, we will perform no other testing except to  monitor electrolytes and renal function. I will see this nice woman  again in 5 months. Vaccinations are up-to-date.    Sincerely,      Gerrit Friends. Dietrich Pates, MD, [facility]  Electronically Signed    RMR/MedQ  DD: 01/27/2006  DT: 01/27/2006  Job #: [id number]

## 2011-02-19 ENCOUNTER — Encounter: Payer: Self-pay | Admitting: Cardiovascular Disease

## 2011-09-19 ENCOUNTER — Encounter (HOSPITAL_COMMUNITY): Payer: Self-pay | Admitting: Pharmacy Technician

## 2011-09-30 ENCOUNTER — Encounter (HOSPITAL_COMMUNITY)
Admission: RE | Disposition: A | Discharge status: Home / Self Care | Payer: Self-pay | Source: Ambulatory Visit | Attending: Cardiology

## 2011-09-30 ENCOUNTER — Ambulatory Visit (HOSPITAL_COMMUNITY)
Admission: RE | Admit: 2011-09-30 | Discharge: 2011-09-30 | Disposition: A | Discharge status: Home / Self Care | Payer: Medicare Other | Source: Ambulatory Visit | Attending: Cardiology | Admitting: Cardiology

## 2011-09-30 ENCOUNTER — Encounter (HOSPITAL_COMMUNITY): Payer: Self-pay | Admitting: Cardiology

## 2011-09-30 DIAGNOSIS — I10 Essential (primary) hypertension: Secondary | ICD-10-CM | POA: Insufficient documentation

## 2011-09-30 DIAGNOSIS — E785 Hyperlipidemia, unspecified: Secondary | ICD-10-CM | POA: Insufficient documentation

## 2011-09-30 DIAGNOSIS — E669 Obesity, unspecified: Secondary | ICD-10-CM | POA: Insufficient documentation

## 2011-09-30 DIAGNOSIS — E119 Type 2 diabetes mellitus without complications: Secondary | ICD-10-CM | POA: Insufficient documentation

## 2011-09-30 DIAGNOSIS — R0989 Other specified symptoms and signs involving the circulatory and respiratory systems: Secondary | ICD-10-CM | POA: Insufficient documentation

## 2011-09-30 DIAGNOSIS — R0609 Other forms of dyspnea: Secondary | ICD-10-CM | POA: Insufficient documentation

## 2011-09-30 DIAGNOSIS — I059 Rheumatic mitral valve disease, unspecified: Secondary | ICD-10-CM | POA: Insufficient documentation

## 2011-09-30 DIAGNOSIS — I2510 Atherosclerotic heart disease of native coronary artery without angina pectoris: Secondary | ICD-10-CM | POA: Insufficient documentation

## 2011-09-30 HISTORY — PX: LEFT AND RIGHT HEART CATHETERIZATION WITH CORONARY ANGIOGRAM: SHX5449

## 2011-09-30 LAB — POCT I-STAT 3, VENOUS BLOOD GAS (G3P V)
Acid-Base Excess: 4 mmol/L — ABNORMAL HIGH (ref 0.0–2.0)
Acid-Base Excess: 4 mmol/L — ABNORMAL HIGH (ref 0.0–2.0)
Bicarbonate: 29.7 mEq/L — ABNORMAL HIGH (ref 20.0–24.0)
O2 Saturation: 67 %
O2 Saturation: 68 %
TCO2: 31 mmol/L (ref 0–100)
pCO2, Ven: 48.6 mmHg (ref 45.0–50.0)

## 2011-09-30 LAB — POCT I-STAT 3, ART BLOOD GAS (G3+): pH, Arterial: 7.386 (ref 7.350–7.450)

## 2011-09-30 LAB — GLUCOSE, CAPILLARY
Glucose-Capillary: 130 mg/dL — ABNORMAL HIGH (ref 70–99)
Glucose-Capillary: 167 mg/dL — ABNORMAL HIGH (ref 70–99)

## 2011-09-30 SURGERY — LEFT AND RIGHT HEART CATHETERIZATION WITH CORONARY ANGIOGRAM
Anesthesia: LOCAL

## 2011-09-30 MED ORDER — ONDANSETRON HCL 4 MG/2ML IJ SOLN: 4.0000 mg | Freq: Four times a day (QID) | INTRAMUSCULAR | Status: DC | PRN

## 2011-09-30 MED ORDER — SODIUM CHLORIDE 0.9 % IJ SOLN: 3.0000 mL | Freq: Two times a day (BID) | INTRAMUSCULAR | Status: DC

## 2011-09-30 MED ORDER — SODIUM CHLORIDE 0.9 % IJ SOLN: 3.0000 mL | INTRAMUSCULAR | Status: DC | PRN

## 2011-09-30 MED ORDER — VERAPAMIL HCL 2.5 MG/ML IV SOLN
INTRAVENOUS | Status: AC
  Filled 2011-09-30: qty 2

## 2011-09-30 MED ORDER — HYDROMORPHONE HCL PF 2 MG/ML IJ SOLN
INTRAMUSCULAR | Status: AC
  Filled 2011-09-30: qty 1

## 2011-09-30 MED ORDER — NITROGLYCERIN 0.2 MG/ML ON CALL CATH LAB
INTRAVENOUS | Status: AC
  Filled 2011-09-30: qty 1

## 2011-09-30 MED ORDER — LIDOCAINE HCL (PF) 1 % IJ SOLN
INTRAMUSCULAR | Status: AC
  Filled 2011-09-30: qty 30

## 2011-09-30 MED ORDER — MIDAZOLAM HCL 2 MG/2ML IJ SOLN
INTRAMUSCULAR | Status: AC
  Filled 2011-09-30: qty 2

## 2011-09-30 MED ORDER — SODIUM CHLORIDE 0.9 % IV SOLN
INTRAVENOUS | Status: DC
  Administered 2011-09-30: 07:00:00 via INTRAVENOUS

## 2011-09-30 MED ORDER — SODIUM CHLORIDE 0.9 % IV SOLN: 1.0000 mL/kg/h | INTRAVENOUS | Status: DC

## 2011-09-30 MED ORDER — ACETAMINOPHEN 325 MG PO TABS: 650.0000 mg | ORAL_TABLET | ORAL | Status: DC | PRN

## 2011-09-30 MED ORDER — SODIUM CHLORIDE 0.9 % IV SOLN: 250.0000 mL | INTRAVENOUS | Status: DC | PRN

## 2011-09-30 MED ORDER — METFORMIN HCL 1000 MG PO TABS: 1000.0000 mg | ORAL_TABLET | Freq: Two times a day (BID) | ORAL | Status: DC

## 2011-09-30 MED ORDER — ASPIRIN 81 MG PO CHEW
324.0000 mg | CHEWABLE_TABLET | ORAL | Status: AC
  Administered 2011-09-30: 324 mg via ORAL
  Filled 2011-09-30: qty 4

## 2011-09-30 NOTE — H&P (Signed)
  Please see paper chart  

## 2011-09-30 NOTE — CV Procedure (Signed)
Procedures performed: Right and left heart catheterization and occlusion of cardiac output and cardiac index by Fick. Right antecubital vein access and right radial artery access was utilized for performing the procedure.   Indication: Patient is a 67 year-old female with diabetes, hyperlipidemia, hypertension,   obesity  Presenting with dyspnea. Outpatient stress testing had revealed normal perfusion last year. However due to worsening symptoms, she is brought to the cardiac catheterization lab to evaluate  coronary anatomy. Right heart catheterization being performed for evaluation of dyspnea and pulmonary hypertension and to evaluate cardiac output and cardiac index.  A 5 French brachial sheath and a 6 French right radial sheath introduced into antecubital vein and radial artery access respectively. A 5 French Swan-Ganz catheter was advanced with balloon inflated on the sheath under fluoroscopic guidance into first the right atrium followed by the right ventricle and into the pulmonary artery to pulmonary artery wedge position. Hemodynamics were obtained in a locations.  After hemodynamics were completed, samples were taken for SaO2% measurement to be used in [facility] /Index catheterization.  The catheter was then pulled back the balloon down and then completely out of the body.   Left Heart Catheterization   Procedural data:  RA pressure 12/7  Mean 7 mm mercury. RA saturation not obtained.  RV pressure 69/5 and Right ventricular EDP 10 mm Hg. Please note after correction of the waveform, RV not re-recorded and hence difference in PA pressure and RV pressure PA pressure 51/22 with a mean of 30  mm mercury. PA saturation 68%.  Pulmonary capillary wedge 19/33 with a mean of 22 mm Hg. Aortic saturation 88%.  Cardiac output was 7.19 with cardiac index of 3.71  by Fick.   Angiographic data  Left ventricle: RAO view performed.  Left systolic shows normal ejection fraction of 55% -60%  Right coronary  artery: Dominant giving origin to a very large PDA patent. Midsegment has a 50% stenosis. There is mild luminal irregularity.  Left main coronary artery: Normal. No stenosis.   LAD: Large, smooth with mild luminal irregularity. Gives origin to a moderate sized diagonal 1 which is smooth and normal.   Circumflex coronary artery: Smooth in the proximal segment. Distal segment shows 50-60% diffuse stenosis. Non-dominant.  Impression: Moderate coronary artery disease involving the mid RCA and distal circumflex carotid artery. Moderate pulmonary tension. There is evidence of mitral valve stenosis with gradient across mitral valve by simultaneous wedge tracing and left ventricle was performed. The mean gradient across the mitral valve was 11.8. Peak to peak gradient was 5 mm mercury. Calculated mitral valve area was 1.69 cm. Although very large giant V waves were evident during wedge tracings, left ventricle gram did not confirm presence of severe mitral regurgitation.  Suspect shortness of breath due to moderate pulmonary tension and we will evaluate mitral valve anatomy with either a repeat transthoracic echocardiogram or consider TEE.   Technique:  First a 5 [place] Tig4 catheter was advanced over standard J-wire into the ascending aorta and used to engage first the Left and Right Coronary Artery. Multiple cineangiographic views of the Left then Right Coronary Artery system(s) were performed. I initially performed LV gram via hand contrast injection. However after having evaluated right heart pressures, I decided to go with pigtail catheter to perform LV gram. A 5  French angled pigtail catheter which was used to cross the aortic valve for measurement Left Ventricular Hemodynamics. Left ventriculography was then performed in the RAO projection. Hemodynamics were then resampled and the catheter  pulled back across the aortic valve for measurement of "pullback" gradient. Simultaneous LV and pulmonary capillary  wedge pressures were also recorded. The catheter was then removed the body over wire. All exchanges were made over standard J wire.

## 2011-09-30 NOTE — Interval H&P Note (Signed)
History and Physical Interval Note:  09/30/2011 9:59 AM  [name]  has presented today for surgery, with the diagnosis of Chest pain  The various methods of treatment have been discussed with the patient and family. After consideration of risks, benefits and other options for treatment, the patient has consented to  Procedure(s) (LRB) with comments: LEFT AND RIGHT HEART CATHETERIZATION WITH CORONARY ANGIOGRAM (N/A) and possible angioplasty as a surgical intervention .  The patient's history has been reviewed, patient examined, no change in status, stable for surgery.  I have reviewed the patient's chart and labs.  Questions were answered to the patient's satisfaction.     Pamella Pert

## 2011-10-29 ENCOUNTER — Other Ambulatory Visit (HOSPITAL_COMMUNITY): Payer: Self-pay | Admitting: Internal Medicine

## 2011-10-29 DIAGNOSIS — Z139 Encounter for screening, unspecified: Secondary | ICD-10-CM

## 2011-11-04 ENCOUNTER — Ambulatory Visit (HOSPITAL_COMMUNITY)
Admission: RE | Admit: 2011-11-04 | Discharge: 2011-11-04 | Disposition: A | Discharge status: Home / Self Care | Payer: Medicare Other | Source: Ambulatory Visit | Attending: Internal Medicine | Admitting: Internal Medicine

## 2011-11-04 DIAGNOSIS — Z1231 Encounter for screening mammogram for malignant neoplasm of breast: Secondary | ICD-10-CM | POA: Insufficient documentation

## 2011-11-04 DIAGNOSIS — Z139 Encounter for screening, unspecified: Secondary | ICD-10-CM

## 2012-05-30 ENCOUNTER — Inpatient Hospital Stay (HOSPITAL_COMMUNITY)
Admission: EM | Admit: 2012-05-30 | Discharge: 2012-06-02 | DRG: 280 | Disposition: A | Discharge status: Home / Self Care | Payer: Medicare Other | Attending: Internal Medicine | Admitting: Internal Medicine

## 2012-05-30 ENCOUNTER — Encounter (HOSPITAL_COMMUNITY): Payer: Self-pay | Admitting: Emergency Medicine

## 2012-05-30 ENCOUNTER — Inpatient Hospital Stay (HOSPITAL_COMMUNITY): Payer: Medicare Other

## 2012-05-30 ENCOUNTER — Emergency Department (HOSPITAL_COMMUNITY): Payer: Medicare Other

## 2012-05-30 DIAGNOSIS — G609 Hereditary and idiopathic neuropathy, unspecified: Secondary | ICD-10-CM | POA: Diagnosis present

## 2012-05-30 DIAGNOSIS — I5033 Acute on chronic diastolic (congestive) heart failure: Secondary | ICD-10-CM | POA: Diagnosis present

## 2012-05-30 DIAGNOSIS — E669 Obesity, unspecified: Secondary | ICD-10-CM

## 2012-05-30 DIAGNOSIS — R110 Nausea: Secondary | ICD-10-CM

## 2012-05-30 DIAGNOSIS — E785 Hyperlipidemia, unspecified: Secondary | ICD-10-CM | POA: Diagnosis present

## 2012-05-30 DIAGNOSIS — E662 Morbid (severe) obesity with alveolar hypoventilation: Secondary | ICD-10-CM | POA: Diagnosis present

## 2012-05-30 DIAGNOSIS — R109 Unspecified abdominal pain: Secondary | ICD-10-CM

## 2012-05-30 DIAGNOSIS — E781 Pure hyperglyceridemia: Secondary | ICD-10-CM | POA: Diagnosis present

## 2012-05-30 DIAGNOSIS — I5032 Chronic diastolic (congestive) heart failure: Secondary | ICD-10-CM

## 2012-05-30 DIAGNOSIS — I509 Heart failure, unspecified: Secondary | ICD-10-CM

## 2012-05-30 DIAGNOSIS — I130 Hypertensive heart and chronic kidney disease with heart failure and stage 1 through stage 4 chronic kidney disease, or unspecified chronic kidney disease: Principal | ICD-10-CM | POA: Diagnosis present

## 2012-05-30 DIAGNOSIS — R0902 Hypoxemia: Secondary | ICD-10-CM | POA: Diagnosis present

## 2012-05-30 DIAGNOSIS — Z8249 Family history of ischemic heart disease and other diseases of the circulatory system: Secondary | ICD-10-CM

## 2012-05-30 DIAGNOSIS — J189 Pneumonia, unspecified organism: Secondary | ICD-10-CM | POA: Diagnosis present

## 2012-05-30 DIAGNOSIS — I5031 Acute diastolic (congestive) heart failure: Secondary | ICD-10-CM

## 2012-05-30 DIAGNOSIS — I214 Non-ST elevation (NSTEMI) myocardial infarction: Secondary | ICD-10-CM | POA: Diagnosis present

## 2012-05-30 DIAGNOSIS — D72829 Elevated white blood cell count, unspecified: Secondary | ICD-10-CM

## 2012-05-30 DIAGNOSIS — N179 Acute kidney failure, unspecified: Secondary | ICD-10-CM | POA: Diagnosis present

## 2012-05-30 DIAGNOSIS — I2510 Atherosclerotic heart disease of native coronary artery without angina pectoris: Secondary | ICD-10-CM | POA: Diagnosis present

## 2012-05-30 DIAGNOSIS — N189 Chronic kidney disease, unspecified: Secondary | ICD-10-CM

## 2012-05-30 DIAGNOSIS — E559 Vitamin D deficiency, unspecified: Secondary | ICD-10-CM | POA: Diagnosis present

## 2012-05-30 DIAGNOSIS — E039 Hypothyroidism, unspecified: Secondary | ICD-10-CM | POA: Diagnosis present

## 2012-05-30 DIAGNOSIS — I059 Rheumatic mitral valve disease, unspecified: Secondary | ICD-10-CM | POA: Diagnosis present

## 2012-05-30 DIAGNOSIS — G4733 Obstructive sleep apnea (adult) (pediatric): Secondary | ICD-10-CM | POA: Diagnosis present

## 2012-05-30 DIAGNOSIS — N182 Chronic kidney disease, stage 2 (mild): Secondary | ICD-10-CM | POA: Diagnosis present

## 2012-05-30 DIAGNOSIS — Z6838 Body mass index (BMI) 38.0-38.9, adult: Secondary | ICD-10-CM

## 2012-05-30 DIAGNOSIS — J9600 Acute respiratory failure, unspecified whether with hypoxia or hypercapnia: Secondary | ICD-10-CM | POA: Diagnosis present

## 2012-05-30 DIAGNOSIS — Z833 Family history of diabetes mellitus: Secondary | ICD-10-CM

## 2012-05-30 DIAGNOSIS — K7689 Other specified diseases of liver: Secondary | ICD-10-CM | POA: Diagnosis present

## 2012-05-30 DIAGNOSIS — E119 Type 2 diabetes mellitus without complications: Secondary | ICD-10-CM | POA: Diagnosis present

## 2012-05-30 HISTORY — DX: Hyperlipidemia, unspecified: E78.5

## 2012-05-30 HISTORY — DX: Vitamin D deficiency, unspecified: E55.9

## 2012-05-30 HISTORY — DX: Polyneuropathy, unspecified: G62.9

## 2012-05-30 HISTORY — DX: Obstructive sleep apnea (adult) (pediatric): G47.33

## 2012-05-30 HISTORY — DX: Disorder of thyroid, unspecified: E07.9

## 2012-05-30 HISTORY — DX: Type 2 diabetes mellitus without complications: E11.9

## 2012-05-30 HISTORY — DX: Disorder of kidney and ureter, unspecified: N28.9

## 2012-05-30 HISTORY — DX: Rheumatic mitral valve disease, unspecified: I05.9

## 2012-05-30 HISTORY — DX: Essential (primary) hypertension: I10

## 2012-05-30 HISTORY — DX: Heart failure, unspecified: I50.9

## 2012-05-30 LAB — URINALYSIS, ROUTINE W REFLEX MICROSCOPIC
Glucose, UA: NEGATIVE mg/dL
Hgb urine dipstick: NEGATIVE
Leukocytes, UA: NEGATIVE
Specific Gravity, Urine: 1.012 (ref 1.005–1.030)
Urobilinogen, UA: 0.2 mg/dL (ref 0.0–1.0)

## 2012-05-30 LAB — COMPREHENSIVE METABOLIC PANEL
ALT: 62 U/L — ABNORMAL HIGH (ref 0–35)
AST: 36 U/L (ref 0–37)
Albumin: 3.3 g/dL — ABNORMAL LOW (ref 3.5–5.2)
CO2: 20 mEq/L (ref 19–32)
Chloride: 94 mEq/L — ABNORMAL LOW (ref 96–112)
Creatinine, Ser: 1.28 mg/dL — ABNORMAL HIGH (ref 0.50–1.10)
GFR calc non Af Amer: 42 mL/min — ABNORMAL LOW (ref >90)
Potassium: 3.8 mEq/L (ref 3.5–5.1)
Sodium: 133 mEq/L — ABNORMAL LOW (ref 135–145)
Total Bilirubin: 0.4 mg/dL (ref 0.3–1.2)

## 2012-05-30 LAB — GLUCOSE, CAPILLARY: Glucose-Capillary: 231 mg/dL — ABNORMAL HIGH (ref 70–99)

## 2012-05-30 LAB — CBC WITH DIFFERENTIAL/PLATELET
Basophils Absolute: 0.1 10*3/uL (ref 0.0–0.1)
Basophils Relative: 0 % (ref 0–1)
HCT: 41.5 % (ref 36.0–46.0)
Lymphocytes Relative: 19 % (ref 12–46)
MCHC: 32.8 g/dL (ref 30.0–36.0)
Neutro Abs: 14.7 10*3/uL — ABNORMAL HIGH (ref 1.7–7.7)
Neutrophils Relative %: 74 % (ref 43–77)
Platelets: 164 10*3/uL (ref 150–400)
RDW: 15.1 % (ref 11.5–15.5)
WBC: 19.8 10*3/uL — ABNORMAL HIGH (ref 4.0–10.5)

## 2012-05-30 LAB — POCT I-STAT TROPONIN I: Troponin i, poc: 0.06 ng/mL (ref 0.00–0.08)

## 2012-05-30 LAB — CBC
HCT: 40.1 % (ref 36.0–46.0)
MCHC: 33.2 g/dL (ref 30.0–36.0)
RDW: 15 % (ref 11.5–15.5)

## 2012-05-30 LAB — CK TOTAL AND CKMB (NOT AT ARMC): Total CK: 305 U/L — ABNORMAL HIGH (ref 7–177)

## 2012-05-30 LAB — TROPONIN I
Troponin I: 0.8 ng/mL (ref <0.30)
Troponin I: 1.01 ng/mL (ref <0.30)

## 2012-05-30 LAB — MRSA PCR SCREENING: MRSA by PCR: NEGATIVE

## 2012-05-30 LAB — CREATININE, SERUM: GFR calc non Af Amer: 41 mL/min — ABNORMAL LOW (ref >90)

## 2012-05-30 MED ORDER — MORPHINE SULFATE 2 MG/ML IJ SOLN
1.0000 mg | INTRAMUSCULAR | Status: DC | PRN
  Administered 2012-05-30: 1 mg via INTRAVENOUS
  Filled 2012-05-30: qty 1

## 2012-05-30 MED ORDER — ALBUTEROL SULFATE (5 MG/ML) 0.5% IN NEBU
2.5000 mg | INHALATION_SOLUTION | RESPIRATORY_TRACT | Status: DC
  Administered 2012-05-30 (×3): 2.5 mg via RESPIRATORY_TRACT
  Filled 2012-05-30 (×3): qty 0.5

## 2012-05-30 MED ORDER — ASPIRIN EC 81 MG PO TBEC
81.0000 mg | DELAYED_RELEASE_TABLET | Freq: Every day | ORAL | Status: DC
  Administered 2012-05-30 – 2012-06-02 (×4): 81 mg via ORAL
  Filled 2012-05-30 (×5): qty 1

## 2012-05-30 MED ORDER — ALBUTEROL SULFATE (5 MG/ML) 0.5% IN NEBU: 2.5000 mg | INHALATION_SOLUTION | RESPIRATORY_TRACT | Status: DC

## 2012-05-30 MED ORDER — ALBUTEROL SULFATE (5 MG/ML) 0.5% IN NEBU: 2.5000 mg | INHALATION_SOLUTION | RESPIRATORY_TRACT | Status: DC | PRN

## 2012-05-30 MED ORDER — SODIUM CHLORIDE 0.9 % IV SOLN
INTRAVENOUS | Status: DC
  Administered 2012-05-30: 10 mL via INTRAVENOUS

## 2012-05-30 MED ORDER — HEPARIN (PORCINE) IN NACL 100-0.45 UNIT/ML-% IJ SOLN
1300.0000 [IU]/h | INTRAMUSCULAR | Status: DC
  Administered 2012-05-30: 1000 [IU]/h via INTRAVENOUS
  Administered 2012-05-31 (×2): 1100 [IU]/h via INTRAVENOUS
  Administered 2012-06-01: 1300 [IU]/h via INTRAVENOUS
  Filled 2012-05-30 (×4): qty 250

## 2012-05-30 MED ORDER — HEPARIN BOLUS VIA INFUSION
2500.0000 [IU] | Freq: Once | INTRAVENOUS | Status: AC
  Administered 2012-05-30: 2500 [IU] via INTRAVENOUS
  Filled 2012-05-30: qty 2500

## 2012-05-30 MED ORDER — IOHEXOL 300 MG/ML  SOLN
25.0000 mL | INTRAMUSCULAR | Status: AC
  Administered 2012-05-30: 25 mL via ORAL

## 2012-05-30 MED ORDER — HYDROCODONE-ACETAMINOPHEN 5-325 MG PO TABS: 1.0000 | ORAL_TABLET | ORAL | Status: DC | PRN

## 2012-05-30 MED ORDER — IPRATROPIUM BROMIDE 0.02 % IN SOLN
0.5000 mg | Freq: Four times a day (QID) | RESPIRATORY_TRACT | Status: DC
  Administered 2012-05-31 (×2): 0.5 mg via RESPIRATORY_TRACT
  Filled 2012-05-30 (×2): qty 2.5

## 2012-05-30 MED ORDER — METRONIDAZOLE IN NACL 5-0.79 MG/ML-% IV SOLN
500.0000 mg | Freq: Three times a day (TID) | INTRAVENOUS | Status: DC
  Administered 2012-05-30 – 2012-05-31 (×3): 500 mg via INTRAVENOUS
  Filled 2012-05-30 (×5): qty 100

## 2012-05-30 MED ORDER — ENOXAPARIN SODIUM 30 MG/0.3ML ~~LOC~~ SOLN
30.0000 mg | SUBCUTANEOUS | Status: DC
  Administered 2012-05-30: 30 mg via SUBCUTANEOUS
  Filled 2012-05-30 (×2): qty 0.3

## 2012-05-30 MED ORDER — ISOSORBIDE MONONITRATE ER 30 MG PO TB24
30.0000 mg | ORAL_TABLET | Freq: Every day | ORAL | Status: DC
  Administered 2012-05-30 – 2012-06-02 (×4): 30 mg via ORAL
  Filled 2012-05-30 (×4): qty 1

## 2012-05-30 MED ORDER — HYDRALAZINE HCL 10 MG PO TABS
10.0000 mg | ORAL_TABLET | Freq: Three times a day (TID) | ORAL | Status: DC
  Administered 2012-05-31 – 2012-06-01 (×4): 10 mg via ORAL
  Filled 2012-05-30 (×8): qty 1

## 2012-05-30 MED ORDER — METOPROLOL TARTRATE 100 MG PO TABS
100.0000 mg | ORAL_TABLET | Freq: Two times a day (BID) | ORAL | Status: DC
  Administered 2012-05-30 – 2012-06-02 (×6): 100 mg via ORAL
  Filled 2012-05-30 (×8): qty 1

## 2012-05-30 MED ORDER — ACETAMINOPHEN 650 MG RE SUPP: 650.0000 mg | Freq: Four times a day (QID) | RECTAL | Status: DC | PRN

## 2012-05-30 MED ORDER — ACETAMINOPHEN 325 MG PO TABS: 650.0000 mg | ORAL_TABLET | Freq: Four times a day (QID) | ORAL | Status: DC | PRN

## 2012-05-30 MED ORDER — LEVOFLOXACIN 750 MG PO TABS: 750.0000 mg | ORAL_TABLET | Freq: Every day | ORAL | Status: DC

## 2012-05-30 MED ORDER — FUROSEMIDE 10 MG/ML IJ SOLN
40.0000 mg | Freq: Once | INTRAMUSCULAR | Status: AC
  Administered 2012-05-30: 40 mg via INTRAVENOUS

## 2012-05-30 MED ORDER — FENOFIBRATE 160 MG PO TABS
160.0000 mg | ORAL_TABLET | Freq: Every day | ORAL | Status: DC
  Administered 2012-05-30 – 2012-06-02 (×4): 160 mg via ORAL
  Filled 2012-05-30 (×5): qty 1

## 2012-05-30 MED ORDER — SODIUM CHLORIDE 0.9 % IJ SOLN
3.0000 mL | Freq: Two times a day (BID) | INTRAMUSCULAR | Status: DC
  Administered 2012-05-30 – 2012-06-02 (×5): 3 mL via INTRAVENOUS

## 2012-05-30 MED ORDER — DOCUSATE SODIUM 100 MG PO CAPS
100.0000 mg | ORAL_CAPSULE | Freq: Two times a day (BID) | ORAL | Status: DC
  Administered 2012-05-30 – 2012-06-02 (×4): 100 mg via ORAL
  Filled 2012-05-30 (×7): qty 1

## 2012-05-30 MED ORDER — SODIUM CHLORIDE 0.9 % IV SOLN: 250.0000 mL | INTRAVENOUS | Status: DC | PRN

## 2012-05-30 MED ORDER — ONDANSETRON HCL 4 MG/2ML IJ SOLN
4.0000 mg | Freq: Four times a day (QID) | INTRAMUSCULAR | Status: DC | PRN
  Administered 2012-05-30 – 2012-06-01 (×3): 4 mg via INTRAVENOUS
  Filled 2012-05-30 (×3): qty 2

## 2012-05-30 MED ORDER — LEVOFLOXACIN IN D5W 750 MG/150ML IV SOLN
750.0000 mg | Freq: Once | INTRAVENOUS | Status: AC
  Administered 2012-05-30: 750 mg via INTRAVENOUS
  Filled 2012-05-30: qty 150

## 2012-05-30 MED ORDER — LEVOTHYROXINE SODIUM 50 MCG PO TABS
50.0000 ug | ORAL_TABLET | Freq: Every day | ORAL | Status: DC
  Administered 2012-05-31 – 2012-06-02 (×3): 50 ug via ORAL
  Filled 2012-05-30 (×4): qty 1

## 2012-05-30 MED ORDER — IPRATROPIUM BROMIDE 0.02 % IN SOLN: 0.5000 mg | Freq: Four times a day (QID) | RESPIRATORY_TRACT | Status: DC

## 2012-05-30 MED ORDER — ONDANSETRON HCL 4 MG PO TABS: 4.0000 mg | ORAL_TABLET | Freq: Four times a day (QID) | ORAL | Status: DC | PRN

## 2012-05-30 MED ORDER — SIMVASTATIN 20 MG PO TABS
20.0000 mg | ORAL_TABLET | Freq: Every day | ORAL | Status: DC
  Administered 2012-05-30 – 2012-06-01 (×3): 20 mg via ORAL
  Filled 2012-05-30 (×5): qty 1

## 2012-05-30 MED ORDER — INSULIN ASPART 100 UNIT/ML ~~LOC~~ SOLN
0.0000 [IU] | Freq: Three times a day (TID) | SUBCUTANEOUS | Status: DC
  Administered 2012-05-30: 3 [IU] via SUBCUTANEOUS
  Administered 2012-05-31: 5 [IU] via SUBCUTANEOUS
  Administered 2012-05-31: 3 [IU] via SUBCUTANEOUS
  Administered 2012-06-01: 2 [IU] via SUBCUTANEOUS
  Administered 2012-06-01: 3 [IU] via SUBCUTANEOUS

## 2012-05-30 MED ORDER — FUROSEMIDE 10 MG/ML IJ SOLN
40.0000 mg | Freq: Once | INTRAMUSCULAR | Status: AC
  Administered 2012-05-30: 40 mg via INTRAVENOUS
  Filled 2012-05-30: qty 4

## 2012-05-30 MED ORDER — ALBUTEROL SULFATE (5 MG/ML) 0.5% IN NEBU
2.5000 mg | INHALATION_SOLUTION | Freq: Four times a day (QID) | RESPIRATORY_TRACT | Status: DC
  Administered 2012-05-31 (×2): 2.5 mg via RESPIRATORY_TRACT
  Filled 2012-05-30 (×2): qty 0.5

## 2012-05-30 MED ORDER — SODIUM CHLORIDE 0.9 % IJ SOLN: 3.0000 mL | INTRAMUSCULAR | Status: DC | PRN

## 2012-05-30 MED ORDER — PANTOPRAZOLE SODIUM 40 MG IV SOLR
40.0000 mg | Freq: Two times a day (BID) | INTRAVENOUS | Status: DC
  Administered 2012-05-30 – 2012-05-31 (×3): 40 mg via INTRAVENOUS
  Filled 2012-05-30 (×3): qty 40

## 2012-05-30 MED ORDER — IPRATROPIUM BROMIDE 0.02 % IN SOLN: 0.5000 mg | RESPIRATORY_TRACT | Status: DC

## 2012-05-30 MED ORDER — NITROGLYCERIN IN D5W 200-5 MCG/ML-% IV SOLN
10.0000 ug/min | INTRAVENOUS | Status: DC
  Administered 2012-05-30: 10 ug/min via INTRAVENOUS
  Filled 2012-05-30: qty 250

## 2012-05-30 MED ORDER — FUROSEMIDE 10 MG/ML IJ SOLN
40.0000 mg | Freq: Two times a day (BID) | INTRAMUSCULAR | Status: DC
  Administered 2012-05-31 – 2012-06-01 (×4): 40 mg via INTRAVENOUS
  Filled 2012-05-30 (×8): qty 4

## 2012-05-30 MED ORDER — IPRATROPIUM BROMIDE 0.02 % IN SOLN
0.5000 mg | RESPIRATORY_TRACT | Status: DC
  Administered 2012-05-30 (×3): 0.5 mg via RESPIRATORY_TRACT
  Filled 2012-05-30 (×3): qty 2.5

## 2012-05-30 NOTE — ED Notes (Signed)
Received pt from home with c/o respiratory distress. Pt c/o shortness of breath increasing past 2 days. Pt given 2 Nitro by EMS.

## 2012-05-30 NOTE — Progress Notes (Signed)
ANTICOAGULATION CONSULT NOTE - Initial Consult  Pharmacy Consult for heparin Indication: chest pain/ACS  No Known Allergies  Patient Measurements:   Heparin Dosing Weight: 64.3 kg  Vital Signs: Temp: 97.8 F (36.6 C) (05/18 2000) Temp src: Other (Comment) (05/18 2000) BP: 125/70 mmHg (05/18 2000) Pulse Rate: 83 (05/18 2000)  Labs:  Recent Labs  05/30/12 1035 05/30/12 1708 05/30/12 1940  HGB 13.6 13.3  --   HCT 41.5 40.1  --   PLT 164 163  --   CREATININE 1.28* 1.31*  --   CKTOTAL  --   --  305*  CKMB  --   --  10.9*  TROPONINI  --  0.80* 1.01*    The CrCl is unknown because both a height and weight (above a minimum accepted value) are required for this calculation.   Medical History: Past Medical History  Diagnosis Date  . Diabetes mellitus without complication   . Hypertension   . Hyperlipemia   . CHF (congestive heart failure)   . Thyroid disease hypothyroid  . Renal disorder Kidney disease stage2  . Obstructive sleep apnea   . Peripheral neuropathy   . Vitamin D deficiency   . Mitral valve disorders     Medications:  Prescriptions prior to admission  Medication Sig Dispense Refill  . amLODipine (NORVASC) 2.5 MG tablet Take 2.5 mg by mouth daily.      Marland Kitchen aspirin EC 81 MG tablet Take 81 mg by mouth daily.      . cholecalciferol (VITAMIN D) 1000 UNITS tablet Take 1,000 Units by mouth daily.      . fenofibrate 160 MG tablet Take 160 mg by mouth daily.      . furosemide (LASIX) 40 MG tablet Take 40 mg by mouth daily.      . insulin aspart protamine- aspart (NOVOLOG 70/30) (70-30) 100 UNIT/ML injection Inject 70-75 Units into the skin 2 (two) times daily. 70 units before breakfast and 75 units before dinner.      . levothyroxine (SYNTHROID, LEVOTHROID) 50 MCG tablet Take 50 mcg by mouth daily.      Marland Kitchen losartan (COZAAR) 25 MG tablet Take 25 mg by mouth daily.      . metFORMIN (GLUCOPHAGE) 1000 MG tablet Take 1 tablet (1,000 mg total) by mouth 2 (two) times  daily with a meal.      . metoprolol (LOPRESSOR) 100 MG tablet Take 100 mg by mouth 2 (two) times daily.      . Naproxen Sodium (ALEVE PO) Take 1 capsule by mouth 2 (two) times daily.      . Omega-3 Fatty Acids (FISH OIL) 1200 MG CAPS Take 1 capsule by mouth 3 (three) times daily.      . pravastatin (PRAVACHOL) 40 MG tablet Take 40 mg by mouth every evening.      Marland Kitchen spironolactone (ALDACTONE) 25 MG tablet Take 25 mg by mouth daily.        Assessment: 67 yo lady with elevated CKMB to start heparin.  Baseline Hg 13.6, PTLC 164  She received lovenox 30 mg sq at 19:12 today. Goal of Therapy:  Heparin level 0.3-0.7 units/ml Monitor platelets by anticoagulation protocol: Yes   Plan:  Heparin 2500 unit bolus (decreased due to lovenox administration) and heparin drip at 1000 units/hr. Check heparin level 8 hours after start. Daily heparin level and CBC while on heparin. Monitor for s&s bleeding  Talbert Cage Poteet 05/30/2012,9:26 PM

## 2012-05-30 NOTE — ED Provider Notes (Addendum)
History     CSN: [id number]  Arrival date & time 05/30/12  1012   First MD Initiated Contact with Patient 05/30/12 1013      Chief Complaint  Patient presents with  . Respiratory Distress    (Consider location/radiation/quality/duration/timing/severity/associated sxs/prior treatment) The history is provided by the patient and the EMS personnel. The history is limited by the condition of the patient.  [name] is a 67 y.o. female hx of DM, HTN, here presenting with shortness of breath. Shortness of breath for the last few days. She also noted that she is has gained some weight over the last few days. Takes Lasix 40 mg daily for heart failure. She said that the shortness of breath got worse this morning so she called ambulance. EMS found her hypoxic to 81% and she was placed on BiPAP and given 2 nitroglycerin by EMS.    Level V caveat- condition of the patient   Past Medical History  Diagnosis Date  . Diabetes mellitus without complication   . Hypertension   . Hyperlipemia   . CHF (congestive heart failure)   . Thyroid disease hypothyroid  . Renal disorder Kidney disease stage2  . Obstructive sleep apnea   . Peripheral neuropathy   . Vitamin D deficiency   . Mitral valve disorders     No past surgical history on file.  No family history on file.  History  Substance Use Topics  . Smoking status: Not on file  . Smokeless tobacco: Not on file  . Alcohol Use: Not on file    OB History   Grav Para Term Preterm Abortions TAB SAB Ect Mult Living                  Review of Systems  Respiratory: Positive for shortness of breath.   All other systems reviewed and are negative.    Allergies  Review of patient's allergies indicates no known allergies.  Home Medications   Current Outpatient Rx  Name  Route  Sig  Dispense  Refill  . amLODipine (NORVASC) 2.5 MG tablet   Oral   Take 2.5 mg by mouth daily.         Marland Kitchen aspirin EC 81 MG tablet   Oral   Take 81  mg by mouth daily.         . Cholecalciferol (VITAMIN D) 2000 UNITS CAPS   Oral   Take 2,000 Units by mouth 3 (three) times daily.         . fenofibrate 160 MG tablet   Oral   Take 160 mg by mouth daily.         . fish oil-omega-3 fatty acids 1000 MG capsule   Oral   Take 1 g by mouth 3 (three) times daily.         . furosemide (LASIX) 40 MG tablet   Oral   Take 40 mg by mouth daily.         . Ibuprofen (ADVIL) 200 MG CAPS   Oral   Take 200 mg by mouth 2 (two) times daily.         . insulin NPH-insulin regular (NOVOLIN 70/30) (70-30) 100 UNIT/ML injection   Subcutaneous   Inject 40-75 Units into the skin 3 (three) times daily with meals. 75 units in the morning 40 units with lunch and 75 units with supper         . levothyroxine (SYNTHROID, LEVOTHROID) 50 MCG tablet   Oral  Take 50 mcg by mouth daily.         Marland Kitchen losartan (COZAAR) 25 MG tablet   Oral   Take 25 mg by mouth daily.         . metFORMIN (GLUCOPHAGE) 1000 MG tablet   Oral   Take 1 tablet (1,000 mg total) by mouth 2 (two) times daily with a meal.         . metoprolol (LOPRESSOR) 100 MG tablet   Oral   Take 100 mg by mouth 2 (two) times daily.         . pravastatin (PRAVACHOL) 40 MG tablet   Oral   Take 40 mg by mouth every evening.         Marland Kitchen spironolactone (ALDACTONE) 25 MG tablet   Oral   Take 25 mg by mouth daily.           BP 144/66  Pulse 88  Temp(Src) 97.6 F (36.4 C) (Oral)  Resp 21  SpO2 100%  Physical Exam  Nursing note and vitals reviewed. Constitutional: She is oriented to person, place, and time.  Uncomfortable, tachypneic, on cpap   HENT:  Head: Normocephalic.  Eyes: Conjunctivae are normal. Pupils are equal, round, and reactive to light.  Neck: Normal range of motion. Neck supple.  Cardiovascular:  Tachycardic   Pulmonary/Chest:  Tachypneic, rales half way up   Abdominal: Soft. Bowel sounds are normal. She exhibits no distension. There is no  tenderness. There is no rebound and no guarding.  Musculoskeletal:  1+ edema bilaterally, no calf tenderness   Neurological: She is alert and oriented to person, place, and time.  Skin: Skin is warm and dry.  Psychiatric: She has a normal mood and affect. Her behavior is normal. Judgment and thought content normal.    ED Course  Procedures (including critical care time)  CRITICAL CARE Performed by: Silverio Lay, Seniah Lawrence   Total critical care time: 45 min   Critical care time was exclusive of separately billable procedures and treating other patients.  Critical care was necessary to treat or prevent imminent or life-threatening deterioration.  Critical care was time spent personally by me on the following activities: development of treatment plan with patient and/or surrogate as well as nursing, discussions with consultants, evaluation of patient's response to treatment, examination of patient, obtaining history from patient or surrogate, ordering and performing treatments and interventions, ordering and review of laboratory studies, ordering and review of radiographic studies, pulse oximetry and re-evaluation of patient's condition.   Labs Reviewed  CBC WITH DIFFERENTIAL - Abnormal; Notable for the following:    WBC 19.8 (*)    Neutro Abs 14.7 (*)    Monocytes Absolute 1.2 (*)    All other components within normal limits  COMPREHENSIVE METABOLIC PANEL - Abnormal; Notable for the following:    Sodium 133 (*)    Chloride 94 (*)    Glucose, Bld 274 (*)    BUN 35 (*)    Creatinine, Ser 1.28 (*)    Albumin 3.3 (*)    ALT 62 (*)    Alkaline Phosphatase 28 (*)    GFR calc non Af Amer 42 (*)    GFR calc Af Amer 48 (*)    All other components within normal limits  PRO B NATRIURETIC PEPTIDE - Abnormal; Notable for the following:    Pro B Natriuretic peptide (BNP) 269.7 (*)    All other components within normal limits  URINALYSIS, ROUTINE W REFLEX MICROSCOPIC - Abnormal; Notable for  the  following:    APPearance HAZY (*)    All other components within normal limits  CULTURE, BLOOD (ROUTINE X 2)  CULTURE, BLOOD (ROUTINE X 2)  LACTIC ACID, PLASMA  POCT I-STAT TROPONIN I   Dg Chest Portable 1 View  05/30/2012   *RADIOLOGY REPORT*  Clinical Data: Respiratory distress and cough  PORTABLE CHEST - 1 VIEW  Comparison: Chest radiograph 01/01/2009  Findings: Heart size appears mildly prominent for portable technique.  There are a cardiac leads in the chest.  Pulmonary vascularity is prominent.  There are diffuse interstitial and airspace opacities throughout both lungs.  Streaky atelectasis at the left lung base.  No visible pleural effusions by portable technique.  No acute osseous abnormality.  IMPRESSION: Congestive heart failure pattern.   Original Report Authenticated By: Britta Mccreedy, M.D.     No diagnosis found.   Date: 05/30/2012  Rate: 97  Rhythm: normal sinus rhythm  QRS Axis: normal  Intervals: normal  ST/T Wave abnormalities: nonspecific ST changes  Conduction Disutrbances:left anterior fascicular block  Narrative Interpretation:   Old EKG Reviewed: unchanged     MDM  MARCELLINE TEMKIN is a 67 y.o. female here with SOB. Likely CHF exacerbation. Will consider ACS as well. She is hypertensive and volume overloaded. Will try lasix and nitro drip. Will reassess.   12 PM She is much more comfortable. About 500 cc urine output after lasix. Still on nitro drip. Will titrate off bipap.   12:29 PM Off bipap for 30 min. Comfortable. Still on nitro drip with BP now in 140s. WBC elevated to 20 but no fever. Still possible to have pneumonia under the heart failure. Cultures sent. Gave levaquin empirically for CAP. Will admit to step down under Dr. Sunnie Nielsen.         Richardean Canal, MD 05/30/12 1229  Richardean Canal, MD 05/30/12 [phone]

## 2012-05-30 NOTE — Progress Notes (Signed)
Dr informed of serum troponin value 27f .8 ( increase from .06 this am) Also , the Pt's cephalocaudel assessment,VS,meds ,etc.... were reviewed . Pt to wear c-pap as @ home tonoc.

## 2012-05-30 NOTE — Progress Notes (Signed)
CRITICAL VALUE ALERT  Critical value received: CKMB 10.9 Date of notification:  05/30/12  Time of notification:  2115  Critical value read back:yes  Nurse who received alert:  Emilie Rutter  MD notified (1st page):  Benedetto Coons  Time of first page:  2120  MD notified (2nd page):  Time of second page:  Responding MD:    Time MD responded:

## 2012-05-30 NOTE — Progress Notes (Signed)
Chaplain Note: Saw  Pt's young teenage grandson standing alone outside [facility] while his grandfather was in the room with the pt. Visited with grandson. Facilitated safe/supportive environment and provided pastoral care for him.  Grandson expressed apreciaiton.  Rutherford Nail Chaplain Resident

## 2012-05-30 NOTE — Progress Notes (Signed)
Patient placed on nasal CPAP for HS per md order and home regimen.  Auto titration (max 20, min 6) mode used as patient's home settings are unknown.   2L oxygen bled in.  Patient is familiar with equipment and procedure.

## 2012-05-30 NOTE — Progress Notes (Signed)
Received patient from EMS.  Placed on Bipap 16/6 with 60% FiO2.  Patient tolerating settings well.  Will continue to monitor.

## 2012-05-30 NOTE — ED Notes (Signed)
Patient transported to CT 

## 2012-05-30 NOTE — H&P (Signed)
[facility] History and Physical  [name] JXB:[id number] DOB: September 27, 1945 DOA: 05/30/2012  Referring physician: Dr Silverio Lay. PCP: Thayer Headings, MD  Specialists: None  Chief Complaint: SOB.   HPI: [name] is a 67 y.o. female with PMH significant for Diabetes, Diastolic Heart Failure grade 2 by ECHO 2010, mitral Valve regurgitation, Obstructive sleep apnea, hypothyroidism who presents to ED via EMS with dyspnea. Patient has notice  increase weight and progressive SOB for last week. She saw her PCP on Thursday, she was told there was not evidence of heart Failure. She relates dyspnea specially on exertion. The morning of admission SOB got worse. She was evaluated by EMS and her oxygen saturation was 80%, she was in respiratory distress. She was started on BIPAP. In the ED she received 40 Mg IV lasix and she was started on nitroglycerin Gtt. Her respiratory status has improved, she was transition to NB mask.   Patient relates also a history of abdominal pain that started 3 days prior to admission. She had a very small BM the day of admission. She was having abdominal pain last night. She denies vomiting, nausea.   She denies chest pain. She does relates generalized weakness, cough, and wheezes.   Review of Systems: Negative except as per HPI.   Past Medical History  Diagnosis Date  . Diabetes mellitus without complication   . Hypertension   . Hyperlipemia   . CHF (congestive heart failure)   . Thyroid disease hypothyroid  . Renal disorder Kidney disease stage2  . Obstructive sleep apnea   . Peripheral neuropathy   . Vitamin D deficiency   . Mitral valve disorders    History reviewed. No pertinent past surgical history. Social History:  Patient denies smoking, alcohol or recreational drugs. She is retired. She use to work as Geneticist, molecular. She lives at home with husband. Daughter lives near by.   No Known Allergies Family History: Parents with history of Heart  Diseases. Mother with history of diabetes.   Prior to Admission medications   Medication Sig Start Date End Date Taking? Authorizing Provider  amLODipine (NORVASC) 2.5 MG tablet Take 2.5 mg by mouth daily.    Historical Provider, MD  aspirin EC 81 MG tablet Take 81 mg by mouth daily.    Historical Provider, MD  Cholecalciferol (VITAMIN D) 2000 UNITS CAPS Take 2,000 Units by mouth 3 (three) times daily.    Historical Provider, MD  fenofibrate 160 MG tablet Take 160 mg by mouth daily.    Historical Provider, MD  fish oil-omega-3 fatty acids 1000 MG capsule Take 1 g by mouth 3 (three) times daily.    Historical Provider, MD  furosemide (LASIX) 40 MG tablet Take 40 mg by mouth daily.    Historical Provider, MD  Ibuprofen (ADVIL) 200 MG CAPS Take 200 mg by mouth 2 (two) times daily.    Historical Provider, MD  insulin NPH-insulin regular (NOVOLIN 70/30) (70-30) 100 UNIT/ML injection Inject 40-75 Units into the skin 3 (three) times daily with meals. 75 units in the morning 40 units with lunch and 75 units with supper    Historical Provider, MD  levothyroxine (SYNTHROID, LEVOTHROID) 50 MCG tablet Take 50 mcg by mouth daily.    Historical Provider, MD  losartan (COZAAR) 25 MG tablet Take 25 mg by mouth daily.    Historical Provider, MD  metFORMIN (GLUCOPHAGE) 1000 MG tablet Take 1 tablet (1,000 mg total) by mouth 2 (two) times daily with a meal. 10/03/11   Tyrone Nine  Tomasita Crumble, MD  metoprolol (LOPRESSOR) 100 MG tablet Take 100 mg by mouth 2 (two) times daily.    Historical Provider, MD  pravastatin (PRAVACHOL) 40 MG tablet Take 40 mg by mouth every evening.    Historical Provider, MD  spironolactone (ALDACTONE) 25 MG tablet Take 25 mg by mouth daily.    Historical Provider, MD   Physical Exam: Filed Vitals:   05/30/12 1230 05/30/12 1245 05/30/12 1300 05/30/12 1315  BP: 147/67  120/65   Pulse: 87 88 88 94  Temp:      TempSrc:      Resp:      SpO2: 100% 100% 100% 96%     General:  Morbid Obese in no  acute distress.   Eyes: PERLA, EOM intact.   ENT: No tonsillar enlargement, no erythema.   Neck: Supple, difficult to evaluate for JVD.   Cardiovascular: S 1, S 2 RRR  Respiratory: Bilateral ronchus, and crackles, unlabored respiration.   Abdomen: Obese, distended, no rigidity, no guarding mild tenderness.   Skin: petechia rash LE.   Musculoskeletal: trace edema, no deformity.   Psychiatric: Apropiate affect.   Neurologic: Non Focal.   Labs on Admission:  Basic Metabolic Panel:  Recent Labs Lab 05/30/12 1035  NA 133*  K 3.8  CL 94*  CO2 20  GLUCOSE 274*  BUN 35*  CREATININE 1.28*  CALCIUM 10.0   Liver Function Tests:  Recent Labs Lab 05/30/12 1035  AST 36  ALT 62*  ALKPHOS 28*  BILITOT 0.4  PROT 7.3  ALBUMIN 3.3*   No results found for this basename: LIPASE, AMYLASE,  in the last 168 hours No results found for this basename: AMMONIA,  in the last 168 hours CBC:  Recent Labs Lab 05/30/12 1035  WBC 19.8*  NEUTROABS 14.7*  HGB 13.6  HCT 41.5  MCV 87.6  PLT 164   Cardiac Enzymes: No results found for this basename: CKTOTAL, CKMB, CKMBINDEX, TROPONINI,  in the last 168 hours  BNP (last 3 results)  Recent Labs  05/30/12 1035  PROBNP 269.7*   CBG: No results found for this basename: GLUCAP,  in the last 168 hours  Radiological Exams on Admission: Dg Chest Portable 1 View  05/30/2012   *RADIOLOGY REPORT*  Clinical Data: Respiratory distress and cough  PORTABLE CHEST - 1 VIEW  Comparison: Chest radiograph 01/01/2009  Findings: Heart size appears mildly prominent for portable technique.  There are a cardiac leads in the chest.  Pulmonary vascularity is prominent.  There are diffuse interstitial and airspace opacities throughout both lungs.  Streaky atelectasis at the left lung base.  No visible pleural effusions by portable technique.  No acute osseous abnormality.  IMPRESSION: Congestive heart failure pattern.   Original Report Authenticated By:  Britta Mccreedy, M.D.    EKG: Independently reviewed. Sinus rhythm.   Assessment/Plan Principal Problem:   Respiratory failure Active Problems:   ABDOMINAL PAIN   Acute diastolic heart failure   Acute on chronic renal failure   Leukocytosis  1-Acute hypoxic Respiratory Failure: This could be multifactorial diastolic HF exacerbation, PNA. Patient respiratory status has improved. I will continue with IV lasix 40 mg IV BID, monitor renal function. I will continue with Levaquin to treat for Community acquired PNA, patient with cough, leukocytosis. Will start Albuterol and Ipratropium.  2-Acute Diastolic Heart Failure exacerbation, although patient has very mild increase pro BNP, chest xray show bilateral interstitial opacities. I will continue with Lasix 40 mg IV BID. I will hold nitroglycerin  Gtt, SBP decreasing. Will order ECHO. Cycle cardiac enzymes. Continue with metoprolol.  3-Leukocytosis: probably secondary to PNA. Check CT abdomen due to complain of abdominal pain. Blood culture ordered.  4-Abdominal Pain: I will start IV protonix. Will order CT abdomen and Pelvis.  5-Acute on Chronic Renal failure; in setting of Heart Failure exacerbation. Monitor on IV lasix. Last Cr per records at 1.1, GFR at 48.    6-Diabetes: hold 70/30 due to NPO status. Will order SSI.     Code Status: Full Code.  Family Communication: Care discussed with daughter and husband who were at bedside.  Disposition Plan: Expect 4 to 5 days inpatient.   Time spent: 75 minutes.   REGALADO,BELKYS [facility] Pager [phone]  If 7PM-7AM, please contact night-coverage [email or web address] Password [email or web address] 05/30/2012, 1:32 PM

## 2012-05-30 NOTE — ED Notes (Signed)
Pt's CBG was 237 when I checked it.2:58 pm JG.

## 2012-05-30 NOTE — Progress Notes (Signed)
[facility] progress note. Chief complaint. Elevated troponin. History of present illness. This 67 year old female admitted earlier today with complaints of shortness of breath. He was noted in the acute hypoxic respiratory failure thought to 2 heart failure and pneumonia. As part of her workup the patient had 3 troponins sets ordered and the first has resulted elevated 0.8. EKG was obtained at the bedside and there is some ST depression in V5 and V6 though this does not appear different than her earlier EKG obtained in the emergency room. Patient has no complaints of chest pain. Vital signs temperature 97.8, pulse 83, respiration 20, blood pressure 125/70. O2 sats 96%. General appearance. Says a well-developed elderly female in no distress. Alert, cooperative, oriented. Cardiac. Rate and rhythm regular. No jugular venous distention or peripheral edema seen. Lungs. Diminished in the bases but clear. No crackles appreciated. Abdomen. Soft and obese with positive bowel sounds. No pain. Extremities. No peripheral edema. No calf pain and negative Homans. Impression/plan. Problem #1 elevated troponin. I did discuss the case with the patient's primary cardiologist Doctor Jacinto Halim. He requests high a start hydralazine 10 mg by mouth 3 times daily and Imdur 30 mg daily with a hold parameters for low blood pressure. He also requests we obtain a CK-MB and if this is normal to simply monitor with medical management but if they CK-MB returns elevated and he requests we start a heparin drip. Patient appears clinically stable at this time.

## 2012-05-31 ENCOUNTER — Encounter (HOSPITAL_COMMUNITY): Payer: Self-pay | Admitting: *Deleted

## 2012-05-31 ENCOUNTER — Inpatient Hospital Stay (HOSPITAL_COMMUNITY): Payer: Medicare Other

## 2012-05-31 LAB — CBC
Hemoglobin: 12 g/dL (ref 12.0–15.0)
MCH: 28.6 pg (ref 26.0–34.0)
MCHC: 33.1 g/dL (ref 30.0–36.0)

## 2012-05-31 LAB — COMPREHENSIVE METABOLIC PANEL
BUN: 36 mg/dL — ABNORMAL HIGH (ref 6–23)
Calcium: 9.5 mg/dL (ref 8.4–10.5)
Creatinine, Ser: 1.65 mg/dL — ABNORMAL HIGH (ref 0.50–1.10)
GFR calc Af Amer: 36 mL/min — ABNORMAL LOW (ref >90)
Glucose, Bld: 223 mg/dL — ABNORMAL HIGH (ref 70–99)
Sodium: 135 mEq/L (ref 135–145)
Total Protein: 6.9 g/dL (ref 6.0–8.3)

## 2012-05-31 LAB — GLUCOSE, CAPILLARY: Glucose-Capillary: 254 mg/dL — ABNORMAL HIGH (ref 70–99)

## 2012-05-31 LAB — CK TOTAL AND CKMB (NOT AT ARMC)
CK, MB: 7.4 ng/mL (ref 0.3–4.0)
Relative Index: 1.6 (ref 0.0–2.5)
Relative Index: 1.5 (ref 0.0–2.5)
Total CK: 490 U/L — ABNORMAL HIGH (ref 7–177)
Total CK: 509 U/L — ABNORMAL HIGH (ref 7–177)

## 2012-05-31 LAB — PRO B NATRIURETIC PEPTIDE: Pro B Natriuretic peptide (BNP): 5820 pg/mL — ABNORMAL HIGH (ref 0–125)

## 2012-05-31 LAB — TROPONIN I
Troponin I: 0.38 ng/mL (ref <0.30)
Troponin I: <0.3 ng/mL (ref <0.30)

## 2012-05-31 MED ORDER — PANTOPRAZOLE SODIUM 40 MG PO TBEC
40.0000 mg | DELAYED_RELEASE_TABLET | Freq: Every day | ORAL | Status: DC
  Administered 2012-06-01: 40 mg via ORAL
  Filled 2012-05-31: qty 1

## 2012-05-31 MED ORDER — LEVOFLOXACIN IN D5W 750 MG/150ML IV SOLN: 750.0000 mg | INTRAVENOUS | Status: DC

## 2012-05-31 MED ORDER — LEVOFLOXACIN IN D5W 750 MG/150ML IV SOLN
750.0000 mg | Freq: Every day | INTRAVENOUS | Status: DC
Start: 2012-05-31 — End: 2012-05-31
  Administered 2012-05-31: 750 mg via INTRAVENOUS
  Filled 2012-05-31: qty 150

## 2012-05-31 MED ORDER — INSULIN GLARGINE 100 UNIT/ML ~~LOC~~ SOLN
14.0000 [IU] | Freq: Two times a day (BID) | SUBCUTANEOUS | Status: DC
  Administered 2012-05-31 – 2012-06-01 (×2): 14 [IU] via SUBCUTANEOUS
  Filled 2012-05-31 (×3): qty 0.14

## 2012-05-31 NOTE — Consult Note (Addendum)
Patient seen and examined. Has elevated cardiac markers including CK CK-MB and troponin. EKG no acute changes. Patient has elevated serum creatinine today. I will repeat BNP today and tomorrow return congestive heart failure. Cardiac markers be positive from CHF, however underlying coronary artery disease with non-ST elevation myocardial infarct needs to be ruled out. Due to her elevated serum creatinine, I have not set up for cardiac catheterization today. I will make further recommendations as the day goes by and I have more lab works available.   patient also complains of abdominal discomfort and states that upper part of her abdomen has been hurting and this is relieved since yesterday, acute cholecystitis needs to be kept in mind. I will order ultrasound of the abdomen to evaluate for gallstones. Patient's physical examination is very limited due to morbid obesity. No acute tenderness was felt in the abdomen. She presented with abdominal discomfort, epigastric region and across the epigastrium and nausea and shortness of breath.  CARDIOLOGY CONSULT NOTE  Patient ID: [name] MRN: [id number] DOB/AGE: 1945-02-21 67 y.o.  Admit date: 05/30/2012 Referring Physician  J. Sharon Seller, MD Primary Physician:  Thayer Headings, MD Reason for Consultation  Abnormal Cardiac markers, Dyspnea.  HPI: Patient is a 67 year old Caucasian female with history of obesity, sleep apnea on CPAP noncompliant, moderate coronary artery disease by cardiac catheterization in September 2013 hypertension and hyperlipidemia and diabetes mellitus was admitted to the hospital with worsening shortness of breath and dyspnea on exertion that started about a week ago.  She has also noticed increasing leg edema.  She was seen by her PCP 4 days prior to hospital admission.  She had started to use increased dose of Lasix for leg edema, which was discontinued however over the past 2 days due to worsening shortness of breath she presented  to the emergency room with acute respiratory distress. She has also been complaining about lower chest upper abdominal discomfort, states that it is like a band around her abdomen that started about a week ago and has been on and off.  Since being in the hospital and started on IV therapy, she states that she has not had this discomfort.  She had nausea but has not thrown up.  No dark stools or bloody stools.  She also had lack of appetite.  No fever or chills.  Past Medical History  Diagnosis Date  . Diabetes mellitus without complication   . Hypertension   . Hyperlipemia   . CHF (congestive heart failure)   . Thyroid disease hypothyroid  . Renal disorder Kidney disease stage2  . Obstructive sleep apnea   . Peripheral neuropathy   . Vitamin D deficiency   . Mitral valve disorders      History reviewed. No pertinent past surgical history.   No family history on file.   Social History: History   Social History  . Marital Status: Married    Spouse Name: N/A    Number of Children: N/A  . Years of Education: N/A   Occupational History  . Not on file.   Social History Main Topics  . Smoking status: Not on file  . Smokeless tobacco: Not on file  . Alcohol Use: Not on file  . Drug Use: Not on file  . Sexually Active: Not on file   Other Topics Concern  . Not on file   Social History Narrative  . No narrative on file     Prescriptions prior to admission  Medication Sig Dispense Refill  .  amLODipine (NORVASC) 2.5 MG tablet Take 2.5 mg by mouth daily.      Marland Kitchen aspirin EC 81 MG tablet Take 81 mg by mouth daily.      . cholecalciferol (VITAMIN D) 1000 UNITS tablet Take 1,000 Units by mouth daily.      . fenofibrate 160 MG tablet Take 160 mg by mouth daily.      . furosemide (LASIX) 40 MG tablet Take 40 mg by mouth daily.      . insulin aspart protamine- aspart (NOVOLOG 70/30) (70-30) 100 UNIT/ML injection Inject 70-75 Units into the skin 2 (two) times daily. 70 units before  breakfast and 75 units before dinner.      . levothyroxine (SYNTHROID, LEVOTHROID) 50 MCG tablet Take 50 mcg by mouth daily.      Marland Kitchen losartan (COZAAR) 25 MG tablet Take 25 mg by mouth daily.      . metFORMIN (GLUCOPHAGE) 1000 MG tablet Take 1 tablet (1,000 mg total) by mouth 2 (two) times daily with a meal.      . metoprolol (LOPRESSOR) 100 MG tablet Take 100 mg by mouth 2 (two) times daily.      . Naproxen Sodium (ALEVE PO) Take 1 capsule by mouth 2 (two) times daily.      . Omega-3 Fatty Acids (FISH OIL) 1200 MG CAPS Take 1 capsule by mouth 3 (three) times daily.      . pravastatin (PRAVACHOL) 40 MG tablet Take 40 mg by mouth every evening.      Marland Kitchen spironolactone (ALDACTONE) 25 MG tablet Take 25 mg by mouth daily.        Scheduled Meds: . albuterol  2.5 mg Nebulization QID   And  . ipratropium  0.5 mg Nebulization QID  . aspirin EC  81 mg Oral Daily  . docusate sodium  100 mg Oral BID  . fenofibrate  160 mg Oral Daily  . furosemide  40 mg Intravenous BID  . hydrALAZINE  10 mg Oral Q8H  . insulin aspart  0-9 Units Subcutaneous TID WC  . isosorbide mononitrate  30 mg Oral Daily  . levofloxacin (LEVAQUIN) IV  750 mg Intravenous Daily  . levothyroxine  50 mcg Oral QAC breakfast  . metoprolol  100 mg Oral BID  . metronidazole  500 mg Intravenous Q8H  . pantoprazole (PROTONIX) IV  40 mg Intravenous Q12H  . simvastatin  20 mg Oral q1800  . sodium chloride  3 mL Intravenous Q12H   Continuous Infusions: . sodium chloride 10 mL (05/30/12 2211)  . heparin 1,000 Units/hr (05/30/12 2200)   PRN Meds:.sodium chloride, acetaminophen, acetaminophen, albuterol, HYDROcodone-acetaminophen, morphine injection, ondansetron (ZOFRAN) IV, ondansetron, sodium chloride  ROS: General: no fevers/chills/night sweats Eyes: no blurry vision, diplopia, or amaurosis ENT: no sore throat or hearing loss Resp:  Wheezing, worsening dyspnea. Has Sleep apnea and compliant with CPAP. CV: Worsening leg edema last 1  week. No palpitations, Has lower chest and upper abdomen pain. GI: no abdominal pain, nausea, vomiting, diarrhea, or constipation GU: no dysuria, frequency, or hematuria Skin: no rash Neuro: no headache, numbness, tingling, or weakness of extremities Heme: no bleeding, DVT, or easy bruising Endo: no polydipsia or polyuria    Physical Exam: Blood pressure 118/50, pulse 64, temperature 98.3 F (36.8 C), temperature source Oral, resp. rate 15, height 5\' 2"  (1.575 m), weight 95.4 kg (210 lb 5.1 oz), SpO2 97.00%.   General appearance: alert, cooperative, appears older than stated age, no distress and moderately obese Neck: no adenopathy,  no JVD, supple, symmetrical, trachea midline, thyroid not enlarged, symmetric, no tenderness/mass/nodules and Short neck and difficult to make. Carotid soft bruit left present Lungs: clear to auscultation bilaterally and decreased breath sounds at bases. Heart: regular rate and rhythm, S1, S2 normal, no murmur, click, rub or gallop Abdomen: soft, non-tender; bowel sounds normal; no masses,  no organomegaly and pannus present. No rebound tenderness. Extremities: extremities normal, atraumatic, no cyanosis or edema Pulses: 2+ and symmetric  Labs:   Lab Results  Component Value Date   WBC 10.4 05/31/2012   HGB 12.0 05/31/2012   HCT 36.3 05/31/2012   MCV 86.4 05/31/2012   PLT 139* 05/31/2012    Recent Labs Lab 05/31/12 0445  NA 135  K 5.0  CL 94*  CO2 25  BUN 36*  CREATININE 1.65*  CALCIUM 9.5  PROT 6.9  BILITOT 0.5  ALKPHOS 25*  ALT 65*  AST 52*  GLUCOSE 223*   Lab Results  Component Value Date   CKTOTAL 305* 05/30/2012   CKMB 10.9* 05/30/2012   TROPONINI 0.38* 05/31/2012    Lipid Panel     Component Value Date/Time   CHOL  Value: 144        ATP III CLASSIFICATION:  <200     mg/dL   Desirable  130-865  mg/dL   Borderline High  >=784    mg/dL   High        69/62/9528 0401   TRIG 344* 01/01/2009 0401   HDL 33* 01/01/2009 0401   CHOLHDL 4.4  01/01/2009 0401   VLDL 69* 01/01/2009 0401   LDLCALC  Value: 42        Total Cholesterol/HDL:CHD Risk Coronary Heart Disease Risk Table                     Men   Women  1/2 Average Risk   3.4   3.3  Average Risk       5.0   4.4  2 X Average Risk   9.6   7.1  3 X Average Risk  23.4   11.0        Use the calculated Patient Ratio above and the CHD Risk Table to determine the patient's CHD Risk.        ATP III CLASSIFICATION (LDL):  <100     mg/dL   Optimal  413-244  mg/dL   Near or Above                    Optimal  130-159  mg/dL   Borderline  010-272  mg/dL   High  >536     mg/dL   Very High 64/40/3474 0401    EKG: EKG 05/30/2012 and compared to 05/31/2012: Normal sinus rhythm, left axis deviation, left and to fascicular block, poor R-wave progression, cannot exclude anterior infarct old, nonspecific T-wave changes.  No significant change.  No significant change from prior admission EKGs and outpatient EKGs from 03/18/2012..    Radiology: CT scan of the abdomen: 05/30/2012   IMPRESSION:  1.  Uncomplicated sigmoid diverticulitis. 2.  Equivocal edema adjacent the gallbladder.  Correlate with right upper quadrant symptoms.  If cholecystitis is a concern, consider dedicated abdominal ultrasound. 3.  Probable congestive heart failure, cardiomegaly, bilateral pleural effusions, and heterogeneous bibasilar ground-glass opacification. 4.  Hepatic steatosis.  Similar atypical hepatic morphology.  Mild cirrhosis cannot be entirely excluded.   Original Report Authenticated By: Jeronimo Greaves, M.D.   Dg Chest Portable 1  View  05/30/2012   *RADIOLOGY REPORT*  Clinical Data: Respiratory distress and cough  PORTABLE CHEST - 1 VIEW  Comparison: Chest radiograph 01/01/2009  Findings: Heart size appears mildly prominent for portable technique.  There are a cardiac leads in the chest.  Pulmonary vascularity is prominent.  There are diffuse interstitial and airspace opacities throughout both lungs.  Streaky atelectasis at the left  lung base.  No visible pleural effusions by portable technique.  No acute osseous abnormality.  IMPRESSION: Congestive heart failure pattern.   Original Report Authenticated By: Britta Mccreedy, M.D.    ASSESSMENT AND PLAN:   1.  Abnormal cardiac markers, suggest non-ST elevation myocardial infarction. 2.  Shortness of breath and dyspnea on exertion, suspect acute on chronic diastolic heart failure. 3.  Coronary artery disease, cardiac catheterization 09/30/2011: Circumflex coronary artery 50-60% mid stenosis, 50% stenosis in the mid RCA.  Normal left ventricle systolic function. 4.  Mild mitral stenosis.  Outpatient echocardiogram done on 10/30/2011 had revealed normal LV systolic function, moderate LVH, grade 2 diastolic dysfunction, mild restriction in posterior mitral leaflet motion with a mean gradient of 3.5 mmHg. 5.  Shortness of breath and dyspnea on exertion of multifactorial etiology including obesity hypoventilation, congestive heart failure, possible pneumonia,. 7.  Acute on chronic renal insufficiency. 8.  Lower chest/upper abdominal discomfort, abnormal CT scan of the abdomen, patient needs to be evaluated for acute cholecystitis in view of elevated WBC count. 9.  Hypertriglyceridemia  Recommendation: Please see my orders, I will repeat an recycle cardiac markers, ultrasound of the abdomen to [facility] for acute cholecystitis, due to acute renal failure, would not recommend repeating cardiac catheterization.  Continue IV heparin for now until all the labs are available.  Pamella Pert, MD 05/31/2012, 12:00 PM [facility] Pager: [phone] Office: [phone] If no answer Cell [phone]

## 2012-05-31 NOTE — Progress Notes (Signed)
[facility] Progress Note [place] TEAM 1 - Stepdown/ICU TEAM   [name] NWG:[id number] DOB: 1945/05/22 DOA: 05/30/2012 PCP: Thayer Headings, MD  Brief narrative: 67 y.o. female with PMH significant for Diabetes, Diastolic Heart Failure grade 2 by ECHO 2010, mitral Valve regurgitation, Obstructive sleep apnea, hypothyroidism who presented to ED via EMS with dyspnea. Patient noticed increase weight and progressive SOB for a week. She saw her PCP, and was reportedly told there was no evidence of heart failure. She related dyspnea especially on exertion. The morning of admission SOB got worse. She was evaluated by EMS and her oxygen saturation was 80% - she was in respiratory distress. She was started on BIPAP. In the ED she received 40 Mg IV lasix and was started on nitroglycerin gtt. Her respiratory status improved.  She was transitioned to NRB mask.  Patient also complained of abdominal pain that started 3 days prior to admission. She had a very small BM the day of admission. She denied vomiting or signif nausea.   Assessment/Plan:  Acute hypoxic Respiratory Failure - multifactorial - due to:   Acute Diastolic Heart Failure exacerbation outpatient echocardiogram 10/30/2011 revealed normal LV systolic function, moderate LVH, grade 2 diastolic dysfunction - improving w/ diuresis - I find no objective evidence to suggest PNA, therefore I have stopped abx today - watch Is/Os - daily wgts - fluid restriction - low salt diet      OHS/SA w/ CPAP noncompliance I counseled patient as to the need for strict compliance with CPAP  Elevated troponin No acute EKG changes - Cardiology is following - moderate coronary artery disease by cardiac catheterization in September 2013  Abdom pain No evidence of signif GB disease on CT or [place] of abdom - follow  HTN Well-controlled at the present time  HLD Continue statin  Acute on Chronic Renal failure Follow renal function with  diuresis  Hypothyroid Check TSH - continue home Synthroid dose  Diabetes CBG poorly controlled at present - adjust treatment and follow - assess A1c  Morbid obesity  Mild mitral stenosis As per cardiology  Code Status: FULL Family Communication: Spoke with patient and husband at bedside Disposition Plan: SDU  Consultants: Cardiolgoy Jacinto Halim  Procedures: none  Antibiotics: Levaquin 5/18 >> 5/19 Flagyl 5/18>> 5/19  DVT prophylaxis: IV heparin  HPI/Subjective: The patient complains of nausea at the time of my visit.  She feels this is due to the fact that she has not been allowed to eat.  She denies current abdominal pain or chest pain.  She reports her shortness of breath is much improved.  Objective: Blood pressure 118/50, pulse 64, temperature 98.3 F (36.8 C), temperature source Oral, resp. rate 15, height 5\' 2"  (1.575 m), weight 95.4 kg (210 lb 5.1 oz), SpO2 97.00%.  Intake/Output Summary (Last 24 hours) at 05/31/12 1135 Last data filed at 05/31/12 0900  Gross per 24 hour  Intake 509.67 ml  Output   2486 ml  Net -1976.33 ml   Exam: General: No acute respiratory distress at rest Lungs: Distant breath sounds - mild bibasilar crackles with no wheeze Cardiovascular: Regular rate and rhythm  Abdomen: Nontender, nondistended, soft, bowel sounds positive, no rebound, no ascites, no appreciable mass Extremities: No significant cyanosis, or clubbing;  1+ edema bilateral lower extremities  Data Reviewed: Basic Metabolic Panel:  Recent Labs Lab 05/30/12 1035 05/30/12 1708 05/31/12 0445  NA 133*  --  135  K 3.8  --  5.0  CL 94*  --  94*  CO2 20  --  25  GLUCOSE 274*  --  223*  BUN 35*  --  36*  CREATININE 1.28* 1.31* 1.65*  CALCIUM 10.0  --  9.5   Liver Function Tests:  Recent Labs Lab 05/30/12 1035 05/31/12 0445  AST 36 52*  ALT 62* 65*  ALKPHOS 28* 25*  BILITOT 0.4 0.5  PROT 7.3 6.9  ALBUMIN 3.3* 3.0*    Recent Labs Lab 05/30/12 1708   LIPASE 32   CBC:  Recent Labs Lab 05/30/12 1035 05/30/12 1708 05/31/12 0445  WBC 19.8* 15.1* 10.4  NEUTROABS 14.7*  --   --   HGB 13.6 13.3 12.0  HCT 41.5 40.1 36.3  MCV 87.6 87.2 86.4  PLT 164 163 139*   Cardiac Enzymes:  Recent Labs Lab 05/30/12 1708 05/30/12 1940 05/31/12 0148  CKTOTAL  --  305*  --   CKMB  --  10.9*  --   TROPONINI 0.80* 1.01* 0.38*   BNP (last 3 results)  Recent Labs  05/30/12 1035  PROBNP 269.7*   CBG:  Recent Labs Lab 05/30/12 1456 05/30/12 1624 05/30/12 2145 05/31/12 0835  GLUCAP 237* 231* 265* 178*    Recent Results (from the past 240 hour(s))  CULTURE, BLOOD (ROUTINE X 2)     Status: None   Collection Time    05/30/12 11:50 AM      Result Value Range Status   Specimen Description BLOOD RIGHT HAND   Final   Special Requests BOTTLES DRAWN AEROBIC ONLY 5CC   Final   Culture  Setup Time 05/30/2012 17:32   Final   Culture     Final   Value:        BLOOD CULTURE RECEIVED NO GROWTH TO DATE CULTURE WILL BE HELD FOR 5 DAYS BEFORE ISSUING A FINAL NEGATIVE REPORT   Report Status PENDING   Incomplete  CULTURE, BLOOD (ROUTINE X 2)     Status: None   Collection Time    05/30/12 11:52 AM      Result Value Range Status   Specimen Description BLOOD LEFT ARM   Final   Special Requests BOTTLES DRAWN AEROBIC AND ANAEROBIC 10CC   Final   Culture  Setup Time 05/30/2012 17:32   Final   Culture     Final   Value:        BLOOD CULTURE RECEIVED NO GROWTH TO DATE CULTURE WILL BE HELD FOR 5 DAYS BEFORE ISSUING A FINAL NEGATIVE REPORT   Report Status PENDING   Incomplete  MRSA PCR SCREENING     Status: None   Collection Time    05/30/12  5:04 PM      Result Value Range Status   MRSA by PCR NEGATIVE  NEGATIVE Final   Comment:            The GeneXpert MRSA Assay (FDA     approved for NASAL specimens     only), is one component of a     comprehensive MRSA colonization     surveillance program. It is not     intended to diagnose MRSA      infection nor to guide or     monitor treatment for     MRSA infections.     Studies:  Recent x-ray studies have been reviewed in detail by the Attending Physician  Scheduled Meds:  Scheduled Meds: . albuterol  2.5 mg Nebulization QID   And  . ipratropium  0.5 mg Nebulization QID  . aspirin  EC  81 mg Oral Daily  . docusate sodium  100 mg Oral BID  . fenofibrate  160 mg Oral Daily  . furosemide  40 mg Intravenous BID  . hydrALAZINE  10 mg Oral Q8H  . insulin aspart  0-9 Units Subcutaneous TID WC  . isosorbide mononitrate  30 mg Oral Daily  . levofloxacin (LEVAQUIN) IV  750 mg Intravenous Daily  . levothyroxine  50 mcg Oral QAC breakfast  . metoprolol  100 mg Oral BID  . metronidazole  500 mg Intravenous Q8H  . pantoprazole (PROTONIX) IV  40 mg Intravenous Q12H  . simvastatin  20 mg Oral q1800  . sodium chloride  3 mL Intravenous Q12H   Continuous Infusions: . sodium chloride 10 mL (05/30/12 2211)  . heparin 1,000 Units/hr (05/30/12 2200)    Time spent on care of this patient:   [facility]  [facility] Office  [phone] Pager - Text Page per Loretha Stapler as per below:  On-Call/Text Page:      [email or web address]      password [email or web address]  If 7PM-7AM, please contact night-coverage [email or web address] Password [email or web address] 05/31/2012, 11:35 AM   LOS: 1 day

## 2012-05-31 NOTE — Progress Notes (Signed)
Echocardiogram 2D Echocardiogram has been performed.  [name] 05/31/2012, 1:51 PM

## 2012-05-31 NOTE — Progress Notes (Signed)
ANTICOAGULATION CONSULT NOTE -  Follow Up Consult  Pharmacy Consult for heparin Indication: chest pain/ACS  No Known Allergies  Patient Measurements: Height: 5\' 2"  (157.5 cm) Weight: 210 lb 5.1 oz (95.4 kg) IBW/kg (Calculated) : 50.1 Heparin Dosing Weight: 64.3 kg  Vital Signs: Temp: 98.3 F (36.8 C) (05/19 0836) Temp src: Oral (05/19 0836) BP: 118/50 mmHg (05/19 0836) Pulse Rate: 64 (05/19 0500)  Labs:  Recent Labs  05/30/12 1035 05/30/12 1708 05/30/12 1940 05/31/12 0148 05/31/12 0445  HGB 13.6 13.3  --   --  12.0  HCT 41.5 40.1  --   --  36.3  PLT 164 163  --   --  139*  HEPARINUNFRC  --   --   --   --  0.27*  CREATININE 1.28* 1.31*  --   --  1.65*  CKTOTAL  --   --  305*  --   --   CKMB  --   --  10.9*  --   --   TROPONINI  --  0.80* 1.01* 0.38*  --     Estimated Creatinine Clearance: 34.6 ml/min (by C-G formula based on Cr of 1.65).   Medical History: Past Medical History  Diagnosis Date  . Diabetes mellitus without complication   . Hypertension   . Hyperlipemia   . CHF (congestive heart failure)   . Thyroid disease hypothyroid  . Renal disorder Kidney disease stage2  . Obstructive sleep apnea   . Peripheral neuropathy   . Vitamin D deficiency   . Mitral valve disorders     Medications:  Prescriptions prior to admission  Medication Sig Dispense Refill  . amLODipine (NORVASC) 2.5 MG tablet Take 2.5 mg by mouth daily.      Marland Kitchen aspirin EC 81 MG tablet Take 81 mg by mouth daily.      . cholecalciferol (VITAMIN D) 1000 UNITS tablet Take 1,000 Units by mouth daily.      . fenofibrate 160 MG tablet Take 160 mg by mouth daily.      . furosemide (LASIX) 40 MG tablet Take 40 mg by mouth daily.      . insulin aspart protamine- aspart (NOVOLOG 70/30) (70-30) 100 UNIT/ML injection Inject 70-75 Units into the skin 2 (two) times daily. 70 units before breakfast and 75 units before dinner.      . levothyroxine (SYNTHROID, LEVOTHROID) 50 MCG tablet Take 50 mcg by  mouth daily.      Marland Kitchen losartan (COZAAR) 25 MG tablet Take 25 mg by mouth daily.      . metFORMIN (GLUCOPHAGE) 1000 MG tablet Take 1 tablet (1,000 mg total) by mouth 2 (two) times daily with a meal.      . metoprolol (LOPRESSOR) 100 MG tablet Take 100 mg by mouth 2 (two) times daily.      . Naproxen Sodium (ALEVE PO) Take 1 capsule by mouth 2 (two) times daily.      . Omega-3 Fatty Acids (FISH OIL) 1200 MG CAPS Take 1 capsule by mouth 3 (three) times daily.      . pravastatin (PRAVACHOL) 40 MG tablet Take 40 mg by mouth every evening.      Marland Kitchen spironolactone (ALDACTONE) 25 MG tablet Take 25 mg by mouth daily.        Assessment: 23 yof  Admitted with DOE and elevated cardiac enzymes.     Baseline Hg 13.6, PTLC 164  She received lovenox 30 mg sq at 19:12 today.  Heparin drip was started 1000  uts/hr after bolus 2500 utsx1 ( dose was decreased due to lovenox administration).  Heparin level 0.27 < goal.  No bleeding noted.  CBC stable.  No plan for cath currently d/t decreased renal function.   Goal of Therapy:  Heparin level 0.3-0.7 units/ml Monitor platelets by anticoagulation protocol: Yes   Plan:   Increase Heparin drip 1100 uts/hr Daily Heparin level, CBC  Leota Sauers Pharm.D. CPP, BCPS Clinical Pharmacist [phone] 05/31/2012 11:35 AM

## 2012-06-01 DIAGNOSIS — R110 Nausea: Secondary | ICD-10-CM

## 2012-06-01 DIAGNOSIS — E669 Obesity, unspecified: Secondary | ICD-10-CM

## 2012-06-01 LAB — HEMOGLOBIN A1C: Mean Plasma Glucose: 169 mg/dL — ABNORMAL HIGH (ref <117)

## 2012-06-01 LAB — CBC
HCT: 35.3 % — ABNORMAL LOW (ref 36.0–46.0)
Hemoglobin: 11.3 g/dL — ABNORMAL LOW (ref 12.0–15.0)
RBC: 4.03 MIL/uL (ref 3.87–5.11)

## 2012-06-01 LAB — GLUCOSE, CAPILLARY
Glucose-Capillary: 181 mg/dL — ABNORMAL HIGH (ref 70–99)
Glucose-Capillary: 176 mg/dL — ABNORMAL HIGH (ref 70–99)

## 2012-06-01 LAB — COMPREHENSIVE METABOLIC PANEL
ALT: 56 U/L — ABNORMAL HIGH (ref 0–35)
CO2: 28 mEq/L (ref 19–32)
Calcium: 9.3 mg/dL (ref 8.4–10.5)
Creatinine, Ser: 1.39 mg/dL — ABNORMAL HIGH (ref 0.50–1.10)
GFR calc Af Amer: 44 mL/min — ABNORMAL LOW (ref >90)
GFR calc non Af Amer: 38 mL/min — ABNORMAL LOW (ref >90)
Glucose, Bld: 193 mg/dL — ABNORMAL HIGH (ref 70–99)
Sodium: 134 mEq/L — ABNORMAL LOW (ref 135–145)
Total Protein: 7 g/dL (ref 6.0–8.3)

## 2012-06-01 LAB — TSH: TSH: 2.783 u[IU]/mL (ref 0.350–4.500)

## 2012-06-01 LAB — TROPONIN I: Troponin I: 0.34 ng/mL (ref <0.30)

## 2012-06-01 LAB — PRO B NATRIURETIC PEPTIDE: Pro B Natriuretic peptide (BNP): 3319 pg/mL — ABNORMAL HIGH (ref 0–125)

## 2012-06-01 LAB — HEPARIN LEVEL (UNFRACTIONATED): Heparin Unfractionated: 0.15 IU/mL — ABNORMAL LOW (ref 0.30–0.70)

## 2012-06-01 MED ORDER — INSULIN GLARGINE 100 UNIT/ML ~~LOC~~ SOLN: 18.0000 [IU] | Freq: Two times a day (BID) | SUBCUTANEOUS | Status: DC

## 2012-06-01 MED ORDER — INSULIN ASPART PROT & ASPART (70-30 MIX) 100 UNIT/ML ~~LOC~~ SUSP: 20.0000 [IU] | Freq: Every day | SUBCUTANEOUS | Status: DC

## 2012-06-01 MED ORDER — HEPARIN BOLUS VIA INFUSION
2000.0000 [IU] | Freq: Once | INTRAVENOUS | Status: AC
  Administered 2012-06-01: 2000 [IU] via INTRAVENOUS
  Filled 2012-06-01: qty 2000

## 2012-06-01 MED ORDER — LOSARTAN POTASSIUM 25 MG PO TABS
25.0000 mg | ORAL_TABLET | Freq: Every day | ORAL | Status: DC
  Administered 2012-06-01 – 2012-06-02 (×2): 25 mg via ORAL
  Filled 2012-06-01 (×2): qty 1

## 2012-06-01 MED ORDER — FUROSEMIDE 40 MG PO TABS: 40.0000 mg | ORAL_TABLET | Freq: Every day | ORAL | Status: DC

## 2012-06-01 MED ORDER — FAMOTIDINE IN NACL 20-0.9 MG/50ML-% IV SOLN
20.0000 mg | Freq: Two times a day (BID) | INTRAVENOUS | Status: DC | PRN
  Filled 2012-06-01: qty 50

## 2012-06-01 MED ORDER — FUROSEMIDE 40 MG PO TABS
40.0000 mg | ORAL_TABLET | Freq: Every day | ORAL | Status: DC
  Administered 2012-06-02: 40 mg via ORAL
  Filled 2012-06-01 (×2): qty 1

## 2012-06-01 MED ORDER — INSULIN ASPART PROT & ASPART (70-30 MIX) 100 UNIT/ML ~~LOC~~ SUSP
35.0000 [IU] | Freq: Every day | SUBCUTANEOUS | Status: DC
  Administered 2012-06-01: 35 [IU] via SUBCUTANEOUS
  Filled 2012-06-01: qty 10

## 2012-06-01 MED ORDER — INSULIN ASPART PROT & ASPART (70-30 MIX) 100 UNIT/ML ~~LOC~~ SUSP
50.0000 [IU] | Freq: Every day | SUBCUTANEOUS | Status: DC
  Administered 2012-06-02: 50 [IU] via SUBCUTANEOUS

## 2012-06-01 NOTE — Progress Notes (Signed)
Discussed with patient importance of using measuring "hat" for urine so we can closely monitor output due to she is taking lasix. Hat was on floor. Relayed to oncoming nurse.

## 2012-06-01 NOTE — Progress Notes (Signed)
[facility] Progress Note [place] TEAM 1 - Stepdown/ICU TEAM   [name] ZOX:[id number] DOB: 30-Dec-1945 DOA: 05/30/2012 PCP: Thayer Headings, MD  Brief narrative: 67 y.o. female with PMH significant for Diabetes, Diastolic Heart Failure grade 2 by ECHO 2010, mitral Valve regurgitation, Obstructive sleep apnea, hypothyroidism who presented to ED via EMS with dyspnea. Patient noticed increase weight and progressive SOB for a week. She saw her PCP, and was reportedly told there was no evidence of heart failure. She related dyspnea especially on exertion. The morning of admission SOB got worse. She was evaluated by EMS and her oxygen saturation was 80% - she was in respiratory distress. She was started on BIPAP. In the ED she received 40 Mg IV lasix and was started on nitroglycerin gtt. Her respiratory status improved.  She was transitioned to NRB mask.  Patient also complained of abdominal pain that started 3 days prior to admission. She had a very small BM the day of admission. She denied vomiting or signif nausea.   Assessment/Plan:  Acute hypoxic Respiratory Failure - multifactorial - due to:   Acute Diastolic Heart Failure exacerbation outpatient echocardiogram 10/30/2011 revealed normal LV systolic function, moderate LVH, grade 2 diastolic dysfunction - improving w/ diuresis -  no objective evidence to suggest PNA, therefore Dr Dolphus Jenny stopped abx on 5/19 - watch Is/Os - daily wgts - fluid restriction - low salt diet      OHS/SA w/ CPAP noncompliance - patient counseled as to the need for strict compliance with CPAP  Elevated troponin No acute EKG changes - Cardiology is following - moderate coronary artery disease by cardiac catheterization in September 2013  Abdom pain/Nausea No evidence of signif GB disease on CT or [place] of abdom - Pepcid added today for nausea-   Fatty Liver - noted incidentally on CT and ultrasound  HTN Well-controlled at the present  time  HLD Continue statin  Acute on Chronic Renal failure Follow renal function with diuresis  Hypothyroid normal TSH - continue home Synthroid dose  Diabetes CBG poorly controlled at present  - Pt takes 70/30 at home- 75 U with breakfast, 55 u with lunch and 65 U with dinner- I have resumed 70/30 but at lower doses due to poor PO inake  -  A1c 7.5  Morbid obesity  Mild mitral stenosis As per cardiology  Code Status: FULL Family Communication: Spoke with patient and husband at bedside Disposition Plan: transfer to telemetry  Consultants: Cardiolgoy Jacinto Halim  Procedures: none  Antibiotics: Levaquin 5/18 >> 5/19 Flagyl 5/18>> 5/19  DVT prophylaxis: IV heparin  HPI/Subjective: The patient continues to have nausea and does not want to eat- Zofran is helping temporarily no abdominal pain today  Objective: Blood pressure 98/44, pulse 78, temperature 98.9 F (37.2 C), temperature source Oral, resp. rate 18, height 5\' 2"  (1.575 m), weight 94.7 kg (208 lb 12.4 oz), SpO2 96.00%.  Intake/Output Summary (Last 24 hours) at 06/01/12 1750 Last data filed at 06/01/12 8119  Gross per 24 hour  Intake    620 ml  Output   2050 ml  Net  -1430 ml   Exam: General: No acute respiratory distress at rest Lungs: Distant breath sounds - mild bibasilar crackles with no wheeze Cardiovascular: Regular rate and rhythm  Abdomen: Nontender, nondistended, soft, bowel sounds positive, no rebound, no ascites, no appreciable mass Extremities: No significant cyanosis, or clubbing or edema bilateral lower extremities  Data Reviewed: Basic Metabolic Panel:  Recent Labs Lab 05/30/12 1035 05/30/12 1708  05/31/12 0445 06/01/12 0345  NA 133*  --  135 134*  K 3.8  --  5.0 3.8  CL 94*  --  94* 92*  CO2 20  --  25 28  GLUCOSE 274*  --  223* 193*  BUN 35*  --  36* 36*  CREATININE 1.28* 1.31* 1.65* 1.39*  CALCIUM 10.0  --  9.5 9.3   Liver Function Tests:  Recent Labs Lab 05/30/12 1035  05/31/12 0445 06/01/12 0345  AST 36 52* 36  ALT 62* 65* 56*  ALKPHOS 28* 25* 27*  BILITOT 0.4 0.5 0.4  PROT 7.3 6.9 7.0  ALBUMIN 3.3* 3.0* 3.0*    Recent Labs Lab 05/30/12 1708  LIPASE 32   CBC:  Recent Labs Lab 05/30/12 1035 05/30/12 1708 05/31/12 0445 06/01/12 0345  WBC 19.8* 15.1* 10.4 9.7  NEUTROABS 14.7*  --   --   --   HGB 13.6 13.3 12.0 11.3*  HCT 41.5 40.1 36.3 35.3*  MCV 87.6 87.2 86.4 87.6  PLT 164 163 139* 143*   Cardiac Enzymes:  Recent Labs Lab 05/30/12 1708 05/30/12 1940 05/31/12 0148 05/31/12 1250 05/31/12 1530 05/31/12 1831 05/31/12 2126 05/31/12 2342  CKTOTAL  --  305*  --  455* 490*  --  509*  --   CKMB  --  10.9*  --  7.4* 7.2*  --  6.4*  --   TROPONINI 0.80* 1.01* 0.38*  --   --  <0.30  --  0.34*   BNP (last 3 results)  Recent Labs  05/30/12 1035 05/31/12 1250 05/31/12 2342  PROBNP 269.7* 5820.0* 3319.0*   CBG:  Recent Labs Lab 05/31/12 2146 06/01/12 0245 06/01/12 0856 06/01/12 1246 06/01/12 1649  GLUCAP 164* 201* 194* 233* 181*    Recent Results (from the past 240 hour(s))  CULTURE, BLOOD (ROUTINE X 2)     Status: None   Collection Time    05/30/12 11:50 AM      Result Value Range Status   Specimen Description BLOOD RIGHT HAND   Final   Special Requests BOTTLES DRAWN AEROBIC ONLY 5CC   Final   Culture  Setup Time 05/30/2012 17:32   Final   Culture     Final   Value:        BLOOD CULTURE RECEIVED NO GROWTH TO DATE CULTURE WILL BE HELD FOR 5 DAYS BEFORE ISSUING A FINAL NEGATIVE REPORT   Report Status PENDING   Incomplete  CULTURE, BLOOD (ROUTINE X 2)     Status: None   Collection Time    05/30/12 11:52 AM      Result Value Range Status   Specimen Description BLOOD LEFT ARM   Final   Special Requests BOTTLES DRAWN AEROBIC AND ANAEROBIC 10CC   Final   Culture  Setup Time 05/30/2012 17:32   Final   Culture     Final   Value:        BLOOD CULTURE RECEIVED NO GROWTH TO DATE CULTURE WILL BE HELD FOR 5 DAYS BEFORE  ISSUING A FINAL NEGATIVE REPORT   Report Status PENDING   Incomplete  MRSA PCR SCREENING     Status: None   Collection Time    05/30/12  5:04 PM      Result Value Range Status   MRSA by PCR NEGATIVE  NEGATIVE Final   Comment:            The GeneXpert MRSA Assay (FDA     approved for NASAL specimens  only), is one component of a     comprehensive MRSA colonization     surveillance program. It is not     intended to diagnose MRSA     infection nor to guide or     monitor treatment for     MRSA infections.     Studies:  Recent x-ray studies have been reviewed in detail by the Attending Physician  Scheduled Meds:  Scheduled Meds: . aspirin EC  81 mg Oral Daily  . docusate sodium  100 mg Oral BID  . fenofibrate  160 mg Oral Daily  . furosemide  40 mg Intravenous BID  . [START ON 06/02/2012] furosemide  40 mg Oral QPC breakfast  . [START ON 06/02/2012] insulin aspart protamine- aspart  20 Units Subcutaneous Q lunch  . insulin aspart protamine- aspart  35 Units Subcutaneous Q supper  . [START ON 06/02/2012] insulin aspart protamine- aspart  50 Units Subcutaneous Q breakfast  . isosorbide mononitrate  30 mg Oral Daily  . levothyroxine  50 mcg Oral QAC breakfast  . losartan  25 mg Oral Daily  . metoprolol  100 mg Oral BID  . pantoprazole  40 mg Oral Q1200  . simvastatin  20 mg Oral q1800  . sodium chloride  3 mL Intravenous Q12H   Continuous Infusions: . sodium chloride 10 mL (05/30/12 2211)    Time spent on care of this patient:   Gary Gabrielsen , MD [phone]  [facility] Office  [phone] Pager - Text Page per Amion as per below:  On-Call/Text Page:      [email or web address]      password [email or web address]  If 7PM-7AM, please contact night-coverage [email or web address] Password [email or web address] 06/01/2012, 5:50 PM   LOS: 2 days

## 2012-06-01 NOTE — Progress Notes (Signed)
ANTICOAGULATION CONSULT NOTE  Pharmacy Consult for heparin Indication: chest pain/ACS  No Known Allergies  Patient Measurements: Height: 5\' 2"  (157.5 cm) Weight: 210 lb 5.1 oz (95.4 kg) IBW/kg (Calculated) : 50.1 Heparin Dosing Weight: 64.3 kg  Vital Signs: Temp: 97.9 F (36.6 C) (05/19 2059) Temp src: Oral (05/19 2059) BP: 113/53 mmHg (05/20 0011) Pulse Rate: 61 (05/20 0011)  Labs:  Recent Labs  05/30/12 1708  05/31/12 0148 05/31/12 0445 05/31/12 1250 05/31/12 1530 05/31/12 1831 05/31/12 2126 05/31/12 2342 06/01/12 0345  HGB 13.3  --   --  12.0  --   --   --   --   --  11.3*  HCT 40.1  --   --  36.3  --   --   --   --   --  35.3*  PLT 163  --   --  139*  --   --   --   --   --  143*  HEPARINUNFRC  --   --   --  0.27*  --   --   --   --   --  0.15*  CREATININE 1.31*  --   --  1.65*  --   --   --   --   --  1.39*  CKTOTAL  --   < >  --   --  455* 490*  --  509*  --   --   CKMB  --   < >  --   --  7.4* 7.2*  --  6.4*  --   --   TROPONINI 0.80*  < > 0.38*  --   --   --  <0.30  --  0.34*  --   < > = values in this interval not displayed.  Estimated Creatinine Clearance: 41.1 ml/min (by C-G formula based on Cr of 1.39).  Assessment: 67 yo female SOB/ACS for heparin   Goal of Therapy:  Heparin level 0.3-0.7 units/ml Monitor platelets by anticoagulation protocol: Yes   Plan:  Heparin 2000 units IV bolus, then increase heparin 1300 units/hr Check heparin level in 8 hours.    Geannie Risen, PharmD, BCPS  06/01/2012 5:50 AM

## 2012-06-01 NOTE — Progress Notes (Signed)
Spoke to Dr. Butler Denmark regarding nausea and patient complaint of "upset stomach". Dr Butler Denmark order IV Pepcid every 12 hours as needed for nausea/GI upset. Will continue to monitor patient.

## 2012-06-01 NOTE — Progress Notes (Signed)
Spoke to [place] with [facility] regarding patient recent blood pressure of 121/42. She advised to hold 1800 dose of Lasix, recheck blood pressure 3 more times and reevaluate for Lasix at 2200. Reported to oncoming RN.

## 2012-06-01 NOTE — Progress Notes (Signed)
Nursing: Urinary foley removed per urinary foley protocol. Discussed with patient the importance to getting catheter removed, what to expect during removal, and to notify nursing staff when she needed to void. Patient verbalized understanding. Urinary foley removed intact. Peri care was provided. Patient reported no complaint of pain or discomfort with catheter removal.

## 2012-06-01 NOTE — Progress Notes (Signed)
Subjective:  Patient feels much better, shortness of breath is improved, no chest pain. No palpitations.  Objective:  Vital Signs in the last 24 hours: Temp:  [97.8 F (36.6 C)-98.8 F (37.1 C)] 98 F (36.7 C) (05/20 0513) Pulse Rate:  [59-86] 63 (05/20 0513) Resp:  [16-24] 17 (05/20 0600) BP: (95-140)/(53-111) 140/111 mmHg (05/20 0513) SpO2:  [95 %-98 %] 96 % (05/20 0513) Weight:  [94.7 kg (208 lb 12.4 oz)] 94.7 kg (208 lb 12.4 oz) (05/20 0500)  Intake/Output from previous day: 05/19 0701 - 05/20 0700 In: 762 [P.O.:360; I.V.:402] Out: 3450 [Urine:3450]  Physical Exam:  General appearance: alert, cooperative, appears older than stated age, no distress and moderately obese  Neck: no adenopathy, no JVD, supple, symmetrical, trachea midline, thyroid not enlarged, symmetric, no tenderness/mass/nodules and Short neck and difficult to make. Carotid soft bruit left present  Lungs: clear to auscultation bilaterally and decreased breath sounds at bases.  Heart: regular rate and rhythm, S1, S2 normal, no murmur, click, rub or gallop  Abdomen: soft, non-tender; bowel sounds normal; no masses, no organomegaly and pannus present. No rebound tenderness.  Extremities: extremities normal, atraumatic, no cyanosis or edema. No tenderness. Pulses: 2+ and symmetric.    Lab Results:  Recent Labs  05/31/12 0445 06/01/12 0345  WBC 10.4 9.7  HGB 12.0 11.3*  PLT 139* 143*    Recent Labs  05/31/12 0445 06/01/12 0345  NA 135 134*  K 5.0 3.8  CL 94* 92*  CO2 25 28  GLUCOSE 223* 193*  BUN 36* 36*  CREATININE 1.65* 1.39*   Cardiac Panel (last 3 results)  Recent Labs  05/31/12 0148 05/31/12 1250 05/31/12 1530 05/31/12 1831 05/31/12 2126 05/31/12 2342  CKTOTAL  --  455* 490*  --  509*  --   CKMB  --  7.4* 7.2*  --  6.4*  --   TROPONINI 0.38*  --   --  <0.30  --  0.34*  RELINDX  --  1.6 1.5  --  1.3  --    BNP (last 3 results)  Recent Labs  05/30/12 1035 05/31/12 1250  05/31/12 2342  PROBNP 269.7* 5820.0* 3319.0*    Hepatic Function Panel  Recent Labs  06/01/12 0345  PROT 7.0  ALBUMIN 3.0*  AST 36  ALT 56*  ALKPHOS 27*  BILITOT 0.4   Echocardiogram 05/31/2012: - Left ventricle: The cavity size was normal. There was mild focal basal and moderate concentric hypertrophy of the septum. Systolic function was  Low normal. The estimated ejection fraction was in the range of 50% to 55%. The study is not technically sufficient to allow evaluation of LV diastolic function. The Dopplers suggest restrictive physiology with elevated LA, LVEDP, however in presence of mitral valve abnormality and calcification, sensitivity reduced. - Mitral valve: Normal-sized, mildly calcified annulus. Normal thickness leaflets . Mobility of the posterior leaflet was mildly restricted. Transvalvular velocity was minimally increased. The findings are consistent with mild stenosis. Moderate regurgitation directed centrally. - Left atrium: The atrium was severely dilated. - Right ventricle: Systolic function was moderately reduced.Pulmonary arteries: PA peak pressure: 52mm Hg (S) The right ventricular systolic pressure was increased consistent with moderate pulmonary hypertension.  Assessment/Plan:  1. Acute on chronic diastolic heart failure, improved on medical therapy.   Hypertensive heart disease, minimal Mitral stenosis, Mod MR. 2. NSTEMI, type 2 troponin leak. 3. Chronic cor pulmonale with moderate pulmonary hypertension 4. Moderate obesity with obesity hypoventilation and obstructive sleep apnea, compliant with CPAP 5. DM-2 HbA1c  pending. 6. Coronary artery disease, cardiac catheterization 09/30/2011: Circumflex coronary artery 50-60% mid stenosis, 50% stenosis in the mid RCA. Normal left ventricle systolic function   Recommendation: I have had extensive discussion with the patient and her daughter at the bedside regarding congestive heart failure, weighing herself on a regular  basis, changes in the diet is to be done. Patient and her daughter do agree that patient has significant changes that she can easily perform at home including changes with diet and salt restriction. I will discontinue IV heparin, IV Lasix and switch her over to DVT heparin and by mouth furosemide, and low dose of losartan and check a BMP in the morning. If her BMP remained stable, she can be discharged home in the morning with outpatient follow-up of congestive heart failure. She can be transferred to a telemetry.       Pamella Pert, M.D. 06/01/2012, 8:43 AM [facility] Pager: [phone] Office: [phone] If no answer: [phone]

## 2012-06-01 NOTE — Progress Notes (Signed)
Placed pt. On CPAP auto titrate (Min: 7, Max: 20) via nasal mask with 2L O2 bled in. Pt. Is unaware of home settings. Pt. Is tolerating CPAP well at this time. Pt. Was made aware to call RT if she had any complications with CPAP during the night.

## 2012-06-01 NOTE — Progress Notes (Signed)
Paged Dr. Butler Denmark regarding Lasix 40mg  IV to be given at 1800 and current blood pressure 98/44. Will continue to monitor patient

## 2012-06-02 DIAGNOSIS — E119 Type 2 diabetes mellitus without complications: Secondary | ICD-10-CM | POA: Diagnosis present

## 2012-06-02 LAB — CBC
Hemoglobin: 11.4 g/dL — ABNORMAL LOW (ref 12.0–15.0)
MCH: 28.4 pg (ref 26.0–34.0)
MCV: 88.8 fL (ref 78.0–100.0)
RBC: 4.01 MIL/uL (ref 3.87–5.11)
WBC: 7.7 10*3/uL (ref 4.0–10.5)

## 2012-06-02 LAB — BASIC METABOLIC PANEL
CO2: 31 mEq/L (ref 19–32)
Calcium: 9 mg/dL (ref 8.4–10.5)
Chloride: 93 mEq/L — ABNORMAL LOW (ref 96–112)
Creatinine, Ser: 1.37 mg/dL — ABNORMAL HIGH (ref 0.50–1.10)
Glucose, Bld: 190 mg/dL — ABNORMAL HIGH (ref 70–99)
Sodium: 134 mEq/L — ABNORMAL LOW (ref 135–145)

## 2012-06-02 LAB — GLUCOSE, CAPILLARY
Glucose-Capillary: 174 mg/dL — ABNORMAL HIGH (ref 70–99)
Glucose-Capillary: 74 mg/dL (ref 70–99)

## 2012-06-02 MED ORDER — ISOSORBIDE MONONITRATE ER 30 MG PO TB24: 30.0000 mg | ORAL_TABLET | Freq: Every day | ORAL | Status: DC

## 2012-06-02 NOTE — Progress Notes (Signed)
Pt discharging home with family.  Instructions given and reviewed. Prescriptions called in.   All questions answered.

## 2012-06-05 LAB — CULTURE, BLOOD (ROUTINE X 2): Culture: NO GROWTH

## 2012-06-08 NOTE — Discharge Summary (Signed)
Physician Discharge Summary  [name]:[id number] DOB: [birth year] DOA: 05/30/2012  PCP: Thayer Headings, MD  Admit date: 05/30/2012 Discharge date: 06/04/2012  Time spent: 45 minutes  Recommendations for Outpatient Follow-up:  1. PCP in 1 week 2. Dr.Ganji in 1-2 weeks  Discharge Diagnoses:  Principal Problem:   Acute Respiratory failure Active Problems:   ABDOMINAL PAIN   Acute diastolic heart failure   Acute on chronic renal failure   Leukocytosis   Nausea alone   Diabetes mellitus   Discharge Condition: improved  Diet recommendation:Low sodium, heart healthy/diabetic  Filed Weights   06/01/12 0500 06/01/12 2147 06/02/12 0415  Weight: 94.7 kg (208 lb 12.4 oz) 95.255 kg (210 lb) 94.484 kg (208 lb 4.8 oz)    History of present illness:  [name] is a 67 y.o. female with PMH significant for Diabetes, Diastolic Heart Failure grade 2 by ECHO 2010, mitral Valve regurgitation, Obstructive sleep apnea, hypothyroidism who presents to ED via EMS with dyspnea. Patient has notice increase weight and progressive SOB for last week. She saw her PCP on Thursday, she was told there was not evidence of heart Failure. She relates dyspnea specially on exertion. The morning of admission SOB got worse. She was evaluated by EMS and her oxygen saturation was 80%, she was in respiratory distress. She was started on BIPAP. In the ED she received 40 Mg IV lasix and she was started on nitroglycerin Gtt. Her respiratory status has improved, she was transition to NB mask.    Hospital Course:  Brief narrative:  67 y.o. female with PMH significant for Diabetes, Diastolic Heart Failure grade 2 by ECHO 2010, mitral Valve regurgitation, Obstructive sleep apnea, hypothyroidism who presented to ED via EMS with dyspnea. Patient noticed increase weight and progressive SOB for a week. She saw her PCP, and was reportedly told there was no evidence of heart failure. She related dyspnea especially on  exertion. The morning of admission SOB got worse. She was evaluated by EMS and her oxygen saturation was 80% - she was in respiratory distress. She was started on BIPAP. In the ED she received 40 Mg IV lasix and was started on nitroglycerin gtt. Her respiratory status improved. She was transitioned to NRB mask.   1.Acute hypoxic Respiratory Failure - multifactorial - improved. Required BIPAP on admission  2. Acute Diastolic Heart Failure exacerbation  outpatient echocardiogram 10/30/2011 revealed normal LV systolic function, moderate LVH, grade 2 diastolic dysfunction - improving w/ diuresis - no objective evidence to suggest PNA, therefore Dr Dolphus Jenny stopped abx on 5/19 - watch Is/Os - daily wgts - fluid restriction - low salt diet  Transitioned to PO diuretics at discharge. CHF education emphasised  3.OHS/SA w/ CPAP noncompliance  - patient counseled as to the need for strict compliance with CPAP   4. CAD  Stable, without acute EKG changes - Cardiology  Dr.Ganji saw pt in consultation and assisted with this pts management. - moderate coronary artery disease by cardiac catheterization in September 2013   5. Fatty Liver  - noted incidentally on CT and ultrasound   6.HTN  Well-controlled at the present time   7. Hypothyroid  normal TSH - continue home Synthroid dose   8. Diabetes  CBG poorly controlled at present  - Pt takes 70/30 at home- 75 U with breakfast, 55 u with lunch and 65 U with dinner- this was resumed at lower dose, Hbaic is 7.5      Discharge Exam: Filed Vitals:   06/02/12 0415 06/02/12  0915 06/02/12 1059 06/02/12 1323  BP: 114/48   109/58  Pulse: 61   52  Temp: 98.7 F (37.1 C)   98.6 F (37 C)  TempSrc: Oral   Oral  Resp: 18     Height:      Weight: 94.484 kg (208 lb 4.8 oz)     SpO2: 98% 97% 94% 96%    General: AAOx3 Cardiovascular: S1S2/RRR Respiratory: CTAB  Discharge Instructions  Discharge Orders   Future Orders Complete By Expires     Diet -  low sodium heart healthy  As directed     Diet Carb Modified  As directed     Increase activity slowly  As directed         Medication List    STOP taking these medications       ALEVE PO     amLODipine 2.5 MG tablet  Commonly known as:  NORVASC     spironolactone 25 MG tablet  Commonly known as:  ALDACTONE      TAKE these medications       aspirin EC 81 MG tablet  Take 81 mg by mouth daily.     cholecalciferol 1000 UNITS tablet  Commonly known as:  VITAMIN D  Take 1,000 Units by mouth daily.     fenofibrate 160 MG tablet  Take 160 mg by mouth daily.     Fish Oil 1200 MG Caps  Take 1 capsule by mouth 3 (three) times daily.     furosemide 40 MG tablet  Commonly known as:  LASIX  Take 40 mg by mouth daily.     insulin aspart protamine- aspart (70-30) 100 UNIT/ML injection  Commonly known as:  NOVOLOG 70/30  Inject 70-75 Units into the skin 2 (two) times daily. 70 units before breakfast and 75 units before dinner.     isosorbide mononitrate 30 MG 24 hr tablet  Commonly known as:  IMDUR  Take 1 tablet (30 mg total) by mouth daily.     levothyroxine 50 MCG tablet  Commonly known as:  SYNTHROID, LEVOTHROID  Take 50 mcg by mouth daily.     losartan 25 MG tablet  Commonly known as:  COZAAR  Take 25 mg by mouth daily.     metFORMIN 1000 MG tablet  Commonly known as:  GLUCOPHAGE  Take 1 tablet (1,000 mg total) by mouth 2 (two) times daily with a meal.     metoprolol 100 MG tablet  Commonly known as:  LOPRESSOR  Take 100 mg by mouth 2 (two) times daily.     pravastatin 40 MG tablet  Commonly known as:  PRAVACHOL  Take 40 mg by mouth every evening.       No Known Allergies     Follow-up Information   Follow up with Pamella Pert, MD. Schedule an appointment as soon as possible for a visit in 1 week.   Contact information:   [address] [place] [address] [phone]        The results of significant diagnostics from this  hospitalization (including imaging, microbiology, ancillary and laboratory) are listed below for reference.    Significant Diagnostic Studies: Ct Abdomen Pelvis Wo Contrast  05/30/2012   *RADIOLOGY REPORT*  Clinical Data: Respiratory distress.  Abdominal pain and distention.  Renal and mitral valve disease.  Congestive heart failure.  CT ABDOMEN AND PELVIS WITHOUT CONTRAST  Technique:  Multidetector CT imaging of the abdomen and pelvis was performed following the standard  protocol without intravenous contrast.  Comparison: 12/09/2005 renal ultrasound.  03/24/2005 abdominal pelvic CT.  Findings: Lung bases:  Mosaic attenuation of both lung bases, favored to be due to heterogeneous ground-glass opacity.  Mild cardiomegaly with coronary artery atherosclerosis.  A small hiatal hernia.  Small bilateral pleural effusions.  Calcified lower mediastinal nodes which are likely related to old granulomatous disease.  Abdomen/pelvis:  Old granulomatous disease in the liver and spleen. Mild to moderate hepatic steatosis.  Atypical hepatic morphology, similar.  This includes prominence of the caudate lobe and lateral segment left lobe.  Normal spleen, distal stomach, pancreas.  Possible subtle pericholecystic edema.  Example image 37/series 2. No calcified stones or biliary ductal dilatation.  Normal adrenal glands and kidneys.  Prominent retroperitoneal nodes are unchanged and likely reactive.  Extensive colonic diverticula.  Inflammation is seen about the proximal sigmoid, mild, including on image 76/series 2.  No free perforation or evidence of pericolonic abscess. Normal terminal ileum and appendix.  Normal small bowel without abdominal ascites.  No pelvic adenopathy.  Foley catheter within urinary bladder. Normal uterus and adnexa, without significant free pelvic fluid.  Bones/Musculoskeletal:  Likely remote traumatic or degenerative regularity about the proximal right femur.  Advanced degenerative disc disease at the  lumbosacral junction.  IMPRESSION:  1.  Uncomplicated sigmoid diverticulitis. 2.  Equivocal edema adjacent the gallbladder.  Correlate with right upper quadrant symptoms.  If cholecystitis is a concern, consider dedicated abdominal ultrasound. 3.  Probable congestive heart failure, cardiomegaly, bilateral pleural effusions, and heterogeneous bibasilar ground-glass opacification. 4.  Hepatic steatosis.  Similar atypical hepatic morphology.  Mild cirrhosis cannot be entirely excluded.   Original Report Authenticated By: Jeronimo Greaves, M.D.   US Abdomen Complete  05/31/2012   The *RADIOLOGY REPORT*  Clinical Data:  Question of gallbladder wall edema and CT  COMPLETE ABDOMINAL ULTRASOUND  Comparison:  CT abdomen pelvis of 05/30/2012  Findings:  Gallbladder:  The gallbladder is well visualized and no gallstones are noted.  There is no evidence of gallbladder wall edema and there is no pain over the gallbladder with compression.  Common bile duct:  The common bile duct is difficult to visualize, with the best measurement being 5 mm in diameter.  Liver:  The liver is echogenic and inhomogeneous consistent with fatty infiltration.  No focal abnormality is seen.  IVC:  The IVC is not visualized.  Pancreas:  The pancreas also is obscured by bowel gas.  Spleen:  There are multiple echo densities within the spleen consistent with small calcified granulomas consistent with prior granulomatous disease.  The spleen measures 6.8 cm sagittally.  Right Kidney:  No hydronephrosis is noted.  The right kidney measures 11.6 cm sagittally.  Left Kidney:  No hydronephrosis is seen.  The left kidney measures 12.0 cm.  Abdominal aorta:  The abdominal aorta is partially obscured by bowel gas but no aneurysm is evident.  IMPRESSION:  1.  Echogenic inhomogeneous liver consistent with fatty infiltration. 2.  No gallstones.  No gallbladder wall edema. 3.  Multiple calcified splenic granulomas. 4.  The pancreas is obscured by bowel gas.    Original Report Authenticated By: Dwyane Dee, M.D.   Dg Chest Portable 1 View  05/30/2012   *RADIOLOGY REPORT*  Clinical Data: Respiratory distress and cough  PORTABLE CHEST - 1 VIEW  Comparison: Chest radiograph 01/01/2009  Findings: Heart size appears mildly prominent for portable technique.  There are a cardiac leads in the chest.  Pulmonary vascularity is prominent.  There are diffuse  interstitial and airspace opacities throughout both lungs.  Streaky atelectasis at the left lung base.  No visible pleural effusions by portable technique.  No acute osseous abnormality.  IMPRESSION: Congestive heart failure pattern.   Original Report Authenticated By: Britta Mccreedy, M.D.    Microbiology: Recent Results (from the past 240 hour(s))  CULTURE, BLOOD (ROUTINE X 2)     Status: None   Collection Time    05/30/12 11:50 AM      Result Value Range Status   Specimen Description BLOOD RIGHT HAND   Final   Special Requests BOTTLES DRAWN AEROBIC ONLY 5CC   Final   Culture  Setup Time 05/30/2012 17:32   Final   Culture NO GROWTH 5 DAYS   Final   Report Status 06/05/2012 FINAL   Final  CULTURE, BLOOD (ROUTINE X 2)     Status: None   Collection Time    05/30/12 11:52 AM      Result Value Range Status   Specimen Description BLOOD LEFT ARM   Final   Special Requests BOTTLES DRAWN AEROBIC AND ANAEROBIC 10CC   Final   Culture  Setup Time 05/30/2012 17:32   Final   Culture NO GROWTH 5 DAYS   Final   Report Status 06/05/2012 FINAL   Final  MRSA PCR SCREENING     Status: None   Collection Time    05/30/12  5:04 PM      Result Value Range Status   MRSA by PCR NEGATIVE  NEGATIVE Final   Comment:            The GeneXpert MRSA Assay (FDA     approved for NASAL specimens     only), is one component of a     comprehensive MRSA colonization     surveillance program. It is not     intended to diagnose MRSA     infection nor to guide or     monitor treatment for     MRSA infections.     Labs: Basic  Metabolic Panel:  Recent Labs Lab 06/02/12 0525  NA 134*  K 3.9  CL 93*  CO2 31  GLUCOSE 190*  BUN 36*  CREATININE 1.37*  CALCIUM 9.0   Liver Function Tests: No results found for this basename: AST, ALT, ALKPHOS, BILITOT, PROT, ALBUMIN,  in the last 168 hours No results found for this basename: LIPASE, AMYLASE,  in the last 168 hours No results found for this basename: AMMONIA,  in the last 168 hours CBC:  Recent Labs Lab 06/02/12 0525  WBC 7.7  HGB 11.4*  HCT 35.6*  MCV 88.8  PLT 145*   Cardiac Enzymes: No results found for this basename: CKTOTAL, CKMB, CKMBINDEX, TROPONINI,  in the last 168 hours BNP: BNP (last 3 results)  Recent Labs  05/30/12 1035 05/31/12 1250 05/31/12 2342  PROBNP 269.7* 5820.0* 3319.0*   CBG:  Recent Labs Lab 06/01/12 2109 06/02/12 0601 06/02/12 1119  GLUCAP 176* 174* 74       Signed:  Gor Vestal  [facility] 06/08/2012, 7:02 PM

## 2012-11-08 ENCOUNTER — Telehealth: Payer: Self-pay | Admitting: *Deleted

## 2012-11-08 NOTE — Telephone Encounter (Signed)
Faxed signed supply prescription back to [facility]. 

## 2012-11-29 ENCOUNTER — Telehealth (INDEPENDENT_AMBULATORY_CARE_PROVIDER_SITE_OTHER): Payer: Self-pay | Admitting: *Deleted

## 2012-11-29 NOTE — Telephone Encounter (Signed)
Voice mail from [place] from [facility] [phone].  Requesting last TCS by Dr. Karilyn Cota ? When  Looked at records was to have had one by Dr. Jena Gauss but did not complete 07/2008.  Not sure did we see in [place]?  Wanted you to fax to [phone].

## 2012-11-30 NOTE — Telephone Encounter (Signed)
Spoke to candy, advised her this was not our patient

## 2013-03-16 ENCOUNTER — Other Ambulatory Visit (HOSPITAL_COMMUNITY): Payer: Self-pay | Admitting: Internal Medicine

## 2013-03-16 DIAGNOSIS — Z1231 Encounter for screening mammogram for malignant neoplasm of breast: Secondary | ICD-10-CM

## 2013-03-21 ENCOUNTER — Ambulatory Visit (HOSPITAL_COMMUNITY)
Admission: RE | Admit: 2013-03-21 | Discharge: 2013-03-21 | Disposition: A | Discharge status: Home / Self Care | Payer: Medicare HMO | Source: Ambulatory Visit | Attending: Internal Medicine | Admitting: Internal Medicine

## 2013-03-21 DIAGNOSIS — Z1231 Encounter for screening mammogram for malignant neoplasm of breast: Secondary | ICD-10-CM | POA: Insufficient documentation

## 2013-12-22 ENCOUNTER — Encounter (HOSPITAL_COMMUNITY): Payer: Self-pay | Admitting: Cardiology

## 2014-02-06 NOTE — Discharge Instructions (Signed)
JEANINE CAVEN  02/06/2014     Instructions    Activity: No Restrictions.   Diet: Resume Diet you were on at home.   Pain Medication: Tylenol if Needed.   CONTACT YOUR DOCTOR IF YOU HAVE PAIN, REDNESS IN YOUR EYE, OR DECREASED VISION.   Follow-up: today between 2:00-3:00  with Elta Gu[place] T. Gershon Crane, MD.   Dr. Gershon Crane: [phone]       If you find that you cannot contact your physician, but feel that your signs and   Symptoms warrant a physician's attention, call the Emergency Room at   [phone] ext.[phone].

## 2014-02-07 ENCOUNTER — Ambulatory Visit (HOSPITAL_COMMUNITY)
Admission: RE | Admit: 2014-02-07 | Discharge: 2014-02-07 | Disposition: A | Discharge status: Home / Self Care | Payer: Commercial Managed Care - HMO | Source: Ambulatory Visit | Attending: Ophthalmology | Admitting: Ophthalmology

## 2014-02-07 ENCOUNTER — Other Ambulatory Visit: Payer: Self-pay

## 2014-02-07 ENCOUNTER — Encounter (HOSPITAL_COMMUNITY): Payer: Self-pay | Admitting: *Deleted

## 2014-02-07 ENCOUNTER — Encounter (HOSPITAL_COMMUNITY)
Admission: RE | Disposition: A | Discharge status: Home / Self Care | Payer: Self-pay | Source: Ambulatory Visit | Attending: Ophthalmology

## 2014-02-07 DIAGNOSIS — R9431 Abnormal electrocardiogram [ECG] [EKG]: Secondary | ICD-10-CM | POA: Diagnosis not present

## 2014-02-07 DIAGNOSIS — Z538 Procedure and treatment not carried out for other reasons: Secondary | ICD-10-CM | POA: Diagnosis not present

## 2014-02-07 DIAGNOSIS — R42 Dizziness and giddiness: Secondary | ICD-10-CM | POA: Diagnosis present

## 2014-02-07 SURGERY — CANCELLED PROCEDURE
Laterality: Right

## 2014-02-07 MED ORDER — TROPICAMIDE 1 % OP SOLN
OPHTHALMIC | Status: AC
  Filled 2014-02-07: qty 3

## 2014-02-07 MED ORDER — TROPICAMIDE 1 % OP SOLN: 1.0000 [drp] | OPHTHALMIC | Status: AC

## 2014-02-07 NOTE — H&P (Signed)
The patient was re examined and there is no change in the patients condition since the original H and P. 

## 2014-02-07 NOTE — Progress Notes (Signed)
Patient complaining of dizziness and shortness of breath on exertion. Stated she passed out last Thursday hitting her head. Vitals on flowsheet. EKG done per order. Spoke with dr Einar Gip requested that patient come to office if she could. EKG report sent to dr Einar Gip.

## 2014-02-07 NOTE — Progress Notes (Signed)
Patient cancelled due to abnormal EKG.  Referred to Dr. Nadyne Coombes in [place]

## 2014-02-09 ENCOUNTER — Telehealth: Payer: Self-pay | Admitting: Cardiovascular Disease

## 2014-02-09 NOTE — Telephone Encounter (Signed)
Received records from [facility] (Dr Christen Butter) for appointment with Dr Claiborne Billings on 04/14/14.  Records given to [facility] (medical records) for Dr Evette Georges schedule on 04/14/14.  lp

## 2014-02-12 ENCOUNTER — Emergency Department (HOSPITAL_COMMUNITY)
Admission: EM | Admit: 2014-02-12 | Discharge: 2014-02-12 | Disposition: A | Discharge status: Home / Self Care | Payer: Medicare HMO | Attending: Emergency Medicine | Admitting: Emergency Medicine

## 2014-02-12 ENCOUNTER — Emergency Department (HOSPITAL_COMMUNITY): Payer: Medicare HMO

## 2014-02-12 ENCOUNTER — Other Ambulatory Visit: Payer: Self-pay

## 2014-02-12 ENCOUNTER — Encounter (HOSPITAL_COMMUNITY): Payer: Self-pay | Admitting: Cardiology

## 2014-02-12 DIAGNOSIS — Z87448 Personal history of other diseases of urinary system: Secondary | ICD-10-CM | POA: Diagnosis not present

## 2014-02-12 DIAGNOSIS — R531 Weakness: Secondary | ICD-10-CM | POA: Insufficient documentation

## 2014-02-12 DIAGNOSIS — E559 Vitamin D deficiency, unspecified: Secondary | ICD-10-CM | POA: Insufficient documentation

## 2014-02-12 DIAGNOSIS — Z8669 Personal history of other diseases of the nervous system and sense organs: Secondary | ICD-10-CM | POA: Diagnosis not present

## 2014-02-12 DIAGNOSIS — I509 Heart failure, unspecified: Secondary | ICD-10-CM | POA: Insufficient documentation

## 2014-02-12 DIAGNOSIS — Z794 Long term (current) use of insulin: Secondary | ICD-10-CM | POA: Diagnosis not present

## 2014-02-12 DIAGNOSIS — E079 Disorder of thyroid, unspecified: Secondary | ICD-10-CM | POA: Diagnosis not present

## 2014-02-12 DIAGNOSIS — Z9889 Other specified postprocedural states: Secondary | ICD-10-CM | POA: Diagnosis not present

## 2014-02-12 DIAGNOSIS — E119 Type 2 diabetes mellitus without complications: Secondary | ICD-10-CM | POA: Diagnosis not present

## 2014-02-12 DIAGNOSIS — E785 Hyperlipidemia, unspecified: Secondary | ICD-10-CM | POA: Insufficient documentation

## 2014-02-12 DIAGNOSIS — I10 Essential (primary) hypertension: Secondary | ICD-10-CM | POA: Insufficient documentation

## 2014-02-12 DIAGNOSIS — Z79899 Other long term (current) drug therapy: Secondary | ICD-10-CM | POA: Diagnosis not present

## 2014-02-12 DIAGNOSIS — R42 Dizziness and giddiness: Secondary | ICD-10-CM | POA: Diagnosis not present

## 2014-02-12 DIAGNOSIS — R110 Nausea: Secondary | ICD-10-CM | POA: Diagnosis present

## 2014-02-12 LAB — BASIC METABOLIC PANEL
ANION GAP: 9 (ref 5–15)
BUN: 15 mg/dL (ref 6–23)
CO2: 28 mmol/L (ref 19–32)
CREATININE: 1 mg/dL (ref 0.50–1.10)
Calcium: 10 mg/dL (ref 8.4–10.5)
Chloride: 100 mmol/L (ref 96–112)
GFR, EST AFRICAN AMERICAN: 64 mL/min — AB (ref >90)
GFR, EST NON AFRICAN AMERICAN: 55 mL/min — AB (ref >90)
GLUCOSE: 202 mg/dL — AB (ref 70–99)
POTASSIUM: 4.2 mmol/L (ref 3.5–5.1)
Sodium: 137 mmol/L (ref 135–145)

## 2014-02-12 LAB — CBC
HEMATOCRIT: 40.6 % (ref 36.0–46.0)
Hemoglobin: 13.4 g/dL (ref 12.0–15.0)
MCH: 28.8 pg (ref 26.0–34.0)
MCHC: 33 g/dL (ref 30.0–36.0)
MCV: 87.1 fL (ref 78.0–100.0)
PLATELETS: 171 10*3/uL (ref 150–400)
RBC: 4.66 MIL/uL (ref 3.87–5.11)
RDW: 13.8 % (ref 11.5–15.5)
WBC: 7.8 10*3/uL (ref 4.0–10.5)

## 2014-02-12 LAB — I-STAT TROPONIN, ED: Troponin i, poc: 0.02 ng/mL (ref 0.00–0.08)

## 2014-02-12 LAB — URINALYSIS, ROUTINE W REFLEX MICROSCOPIC
Bilirubin Urine: NEGATIVE
Glucose, UA: NEGATIVE mg/dL
Hgb urine dipstick: NEGATIVE
Ketones, ur: NEGATIVE mg/dL
Leukocytes, UA: NEGATIVE
Nitrite: NEGATIVE
Protein, ur: NEGATIVE mg/dL
Specific Gravity, Urine: 1.015 (ref 1.005–1.030)
Urobilinogen, UA: 0.2 mg/dL (ref 0.0–1.0)
pH: 6.5 (ref 5.0–8.0)

## 2014-02-12 MED ORDER — SODIUM CHLORIDE 0.9 % IV BOLUS (SEPSIS)
250.0000 mL | INTRAVENOUS | Status: AC
  Administered 2014-02-12: 250 mL via INTRAVENOUS

## 2014-02-12 MED ORDER — MECLIZINE HCL 25 MG PO TABS: 25.0000 mg | ORAL_TABLET | Freq: Three times a day (TID) | ORAL | Status: DC | PRN

## 2014-02-12 MED ORDER — ONDANSETRON HCL 4 MG/2ML IJ SOLN
4.0000 mg | Freq: Once | INTRAMUSCULAR | Status: AC
  Administered 2014-02-12: 4 mg via INTRAVENOUS
  Filled 2014-02-12: qty 2

## 2014-02-12 NOTE — ED Notes (Signed)
Pt reports she has felt nauseated and weak for the past couple of days. States she went to [facility] for a procedure on Tuesday but her BP and HR were to low for the procedure. States that she feels dizzy. Her cardiologist took off the losartan.

## 2014-02-12 NOTE — ED Provider Notes (Signed)
CSN: [id number]     Arrival date & time 02/12/14  1055 History   First MD Initiated Contact with Patient 02/12/14 1230     Chief Complaint  Patient presents with  . Nausea     (Consider location/radiation/quality/duration/timing/severity/associated sxs/prior Treatment) HPI Patient presents to the emergency department with generalized weakness and nausea over the last week.  The patient states he got worse today.  She said she felt like she was dizzy.  The patient went to have a procedure done this past Tuesday and her blood pressure and heart rate were low and she was sent her cardiologist.  The cardiologist took her off losartan, place her on valsartan as a replacement.  The patient states that since that time.  She has not felt any improvement in her symptoms and today he felt worse.  Patient states that she has not had any chest pain, shortness of breath, vomiting, diarrhea, rash, fever, cough, runny nose, sore throat, abdominal pain, dysuria, hematemesis, bloody stools or syncope.  Patient states nothing seems make her condition better or worse  Past Medical History  Diagnosis Date  . Diabetes mellitus without complication   . Hypertension   . Hyperlipemia   . CHF (congestive heart failure)   . Thyroid disease hypothyroid  . Renal disorder Kidney disease stage2  . Obstructive sleep apnea   . Peripheral neuropathy   . Vitamin D deficiency   . Mitral valve disorders    Past Surgical History  Procedure Laterality Date  . Left and right heart catheterization with coronary angiogram N/A 09/30/2011    Procedure: LEFT AND RIGHT HEART CATHETERIZATION WITH CORONARY ANGIOGRAM;  Surgeon: Laverda Page, MD;  Location: [facility] CATH LAB;  Service: Cardiovascular;  Laterality: N/A;   History reviewed. No pertinent family history. History  Substance Use Topics  . Smoking status: Never Smoker   . Smokeless tobacco: Never Used  . Alcohol Use: No   OB History    No data available     Review  of Systems All other systems negative except as documented in the HPI. All pertinent positives and negatives as reviewed in the HPI.   Allergies  Review of patient's allergies indicates no known allergies.  Home Medications   Prior to Admission medications   Medication Sig Start Date End Date Taking? Authorizing Provider  aspirin EC 81 MG tablet Take 81 mg by mouth daily.    Historical Provider, MD  cholecalciferol (VITAMIN D) 1000 UNITS tablet Take 1,000 Units by mouth daily.    Historical Provider, MD  fenofibrate 160 MG tablet Take 160 mg by mouth daily.    Historical Provider, MD  furosemide (LASIX) 40 MG tablet Take 40 mg by mouth daily.    Historical Provider, MD  insulin aspart protamine- aspart (NOVOLOG 70/30) (70-30) 100 UNIT/ML injection Inject 55-65 Units into the skin 2 (two) times daily. 65 units before breakfast and 55 units before dinner.    Historical Provider, MD  isosorbide mononitrate (IMDUR) 30 MG 24 hr tablet Take 1 tablet (30 mg total) by mouth daily. 06/02/12   Domenic Polite, MD  levothyroxine (SYNTHROID, LEVOTHROID) 50 MCG tablet Take 50 mcg by mouth daily.    Historical Provider, MD  losartan (COZAAR) 25 MG tablet Take 25 mg by mouth daily.    Historical Provider, MD  metFORMIN (GLUCOPHAGE) 1000 MG tablet Take 1 tablet (1,000 mg total) by mouth 2 (two) times daily with a meal. 10/03/11   Laverda Page, MD  metoprolol (LOPRESSOR) 100  MG tablet Take 100 mg by mouth 2 (two) times daily.    Historical Provider, MD  Omega-3 Fatty Acids (FISH OIL) 1200 MG CAPS Take 1 capsule by mouth 3 (three) times daily.    Historical Provider, MD  pravastatin (PRAVACHOL) 40 MG tablet Take 40 mg by mouth every evening.    Historical Provider, MD   BP 120/70 mmHg  Pulse 54  Temp(Src) 98.2 F (36.8 C) (Oral)  Resp 18  SpO2 94% Physical Exam  Constitutional: She is oriented to person, place, and time. She appears well-developed and well-nourished. No distress.  HENT:  Head:  Normocephalic and atraumatic.  Mouth/Throat: Oropharynx is clear and moist.  Eyes: Pupils are equal, round, and reactive to light.  Neck: Normal range of motion. Neck supple.  Cardiovascular: Normal rate, regular rhythm and normal heart sounds.  Exam reveals no gallop and no friction rub.   No murmur heard. Pulmonary/Chest: Effort normal and breath sounds normal. No respiratory distress.  Abdominal: Soft. Bowel sounds are normal. She exhibits no distension. There is no tenderness.  Neurological: She is alert and oriented to person, place, and time. She exhibits normal muscle tone. Coordination normal.  Skin: Skin is warm and dry. No rash noted. No erythema.  Nursing note and vitals reviewed.   ED Course  Procedures (including critical care time) Labs Review Labs Reviewed  BASIC METABOLIC PANEL - Abnormal; Notable for the following:    Glucose, Bld 202 (*)    GFR calc non Af Amer 55 (*)    GFR calc Af Amer 64 (*)    All other components within normal limits  CBC  Randolm Idol, ED    Imaging Review Mr Brain Wo Contrast  02/12/2014   CLINICAL DATA:  Nausea and weakness for several days.  Dizziness.  EXAM: MRI HEAD WITHOUT CONTRAST  TECHNIQUE: Multiplanar, multiecho pulse sequences of the brain and surrounding structures were obtained without intravenous contrast.  COMPARISON:  None.  FINDINGS: Diffusion imaging does not show any acute or subacute infarction. The brainstem and cerebellum are normal. There is an old infarction in the insular region and frontal operculum were region on the right with atrophy, encephalomalacia and gliosis. No other small or large vessel infarction. No mass lesion, hemorrhage, hydrocephalus or extra-axial collection. No pituitary mass. No inflammatory sinus disease. There is a small amount of fluid in the mastoid air cells, left more than right. Major vessels at the base of the brain show flow.  IMPRESSION: No acute brain infarction. 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  Some fluid in the mastoid air cells, left more than right, could possibly be associated with dizziness.   Electronically Signed   By: Nelson Chimes M.D.   On: 02/12/2014 14:11     EKG Interpretation   Date/Time:  Sunday February 12 2014 11:07:48 EST Ventricular Rate:  53 PR Interval:  190 QRS Duration: 102 QT Interval:  430 QTC Calculation: 403 R Axis:   1 Text Interpretation:  Sinus bradycardia Septal infarct , age undetermined  Abnormal ECG Sinus bradycardia Artifact T wave abnormality Abnormal ekg  Confirmed by Carmin Muskrat  MD [phone]) on 02/12/2014 3:20:24 PM      Patient will be discharged home.  She is advised to return here as needed.  Told to follow up with her primary care doctor.  The MRI was negative.  The patient is advised of the results and all questions were answered.  Patient most likely has  a vertiginous-type scenario or med issue.  She will follow up with her primary care doctor    Brent General, PA-C 02/12/14 Floyd Hill, MD 02/14/14 [phone]

## 2014-02-12 NOTE — Discharge Instructions (Signed)
Return here as needed. Follow up with your doctor. Increase your fluid intake. °

## 2014-02-20 MED ORDER — TROPICAMIDE 1 % OP SOLN
OPHTHALMIC | Status: AC
  Filled 2014-02-20: qty 3

## 2014-02-20 NOTE — Discharge Instructions (Addendum)
[name]  02/20/2014     Instructions    Activity: No Restrictions.   Diet: Resume Diet you were on at home.   Pain Medication: Tylenol if Needed.   CONTACT YOUR DOCTOR IF YOU HAVE PAIN, REDNESS IN YOUR EYE, OR DECREASED VISION.   Follow-up: today between 1:00-2:00  with Elta Guadeloupe T. Gershon Crane, MD.   Dr. Gershon Crane: [phone]       If you find that you cannot contact your physician, but feel that your signs and   Symptoms warrant a physician's attention, call the Emergency Room at   [phone] [phone].     [name]  03/07/2014     Instructions    Activity: No Restrictions.   Diet: Resume Diet you were on at home.   Pain Medication: Tylenol if Needed.   CONTACT YOUR DOCTOR IF YOU HAVE PAIN, REDNESS IN YOUR EYE, OR DECREASED VISION.   Follow-up:in the next few days with Elta Guadeloupe T. Gershon Crane, MD.   Dr. Gershon Crane: [phone]  Dr. Iona Hansen: [phone]  Dr. Geoffry Paradise: [phone]   If you find that you cannot contact your physician, but feel that your signs and   Symptoms warrant a physician's attention, call the Emergency Room at   [phone] [phone].   Othern/a.

## 2014-02-21 NOTE — H&P (Signed)
The patient was re examined and there is no change in the patients condition since the original H and P. 

## 2014-02-21 NOTE — Progress Notes (Signed)
Patient cancelled YAG today

## 2014-03-07 ENCOUNTER — Ambulatory Visit (HOSPITAL_COMMUNITY)
Admission: RE | Admit: 2014-03-07 | Discharge: 2014-03-07 | Disposition: A | Discharge status: Home / Self Care | Payer: Commercial Managed Care - HMO | Source: Ambulatory Visit | Attending: Ophthalmology | Admitting: Ophthalmology

## 2014-03-07 ENCOUNTER — Encounter (HOSPITAL_COMMUNITY)
Admission: RE | Disposition: A | Discharge status: Home / Self Care | Payer: Self-pay | Source: Ambulatory Visit | Attending: Ophthalmology

## 2014-03-07 DIAGNOSIS — H26491 Other secondary cataract, right eye: Secondary | ICD-10-CM | POA: Insufficient documentation

## 2014-03-07 DIAGNOSIS — H26499 Other secondary cataract, unspecified eye: Secondary | ICD-10-CM | POA: Diagnosis present

## 2014-03-07 HISTORY — PX: YAG LASER APPLICATION: SHX6189

## 2014-03-07 SURGERY — TREATMENT, USING YAG LASER
Anesthesia: LOCAL | Laterality: Right

## 2014-03-07 MED ORDER — CYCLOPENTOLATE-PHENYLEPHRINE 0.2-1 % OP SOLN: 1.0000 [drp] | OPHTHALMIC | Status: DC

## 2014-03-07 MED ORDER — TROPICAMIDE 1 % OP SOLN
1.0000 [drp] | OPHTHALMIC | Status: AC
  Filled 2014-03-07: qty 3

## 2014-03-07 MED ORDER — TROPICAMIDE 1 % OP SOLN
1.0000 [drp] | OPHTHALMIC | Status: AC
  Administered 2014-03-07 (×3): 1 [drp] via OPHTHALMIC

## 2014-03-07 NOTE — Op Note (Signed)
[name] Weinfeld T. Gershon Crane, MD  Procedure: Yag Capsulotomy  Yag Laser Self Test Completedyes. Procedure: Posterior Capsulotomy, Eye Protection Worn by Staff yes. Laser In Use Sign on Door yes.  Laser: Nd:YAG Spot Size: Fixed Burst Mode: III Power Setting: 3.2 mJ/burst Number of shots: 27 Total energy delivered: 82.99 mJ   The patient tolerated the procedure without difficulty. No complications were encountered.   The patient was discharged home with the instructions to continue all her current glaucoma medications, if any.   Patient instructed to go to office at 0100 for intraocular pressure check.  Patient verbalizes understanding of discharge instructions Yes.  .   Pre-Operative Diagnosis: After-Cataract, obscuring vision, 366.53 OD Post-Operative Diagnosis: After-Cataract, obscuring vision, 366.53 OD

## 2014-03-07 NOTE — H&P (Signed)
The patient was re examined and there is no change in the patients condition since the original H and P. 

## 2014-03-08 ENCOUNTER — Encounter (HOSPITAL_COMMUNITY): Payer: Self-pay | Admitting: Ophthalmology

## 2014-03-21 ENCOUNTER — Encounter (HOSPITAL_COMMUNITY): Payer: Self-pay | Admitting: *Deleted

## 2014-03-21 ENCOUNTER — Ambulatory Visit (HOSPITAL_COMMUNITY)
Admission: RE | Admit: 2014-03-21 | Discharge: 2014-03-21 | Disposition: A | Discharge status: Home / Self Care | Payer: Medicare HMO | Source: Ambulatory Visit | Attending: Ophthalmology | Admitting: Ophthalmology

## 2014-03-21 ENCOUNTER — Encounter (HOSPITAL_COMMUNITY)
Admission: RE | Disposition: A | Discharge status: Home / Self Care | Payer: Self-pay | Source: Ambulatory Visit | Attending: Ophthalmology

## 2014-03-21 DIAGNOSIS — I10 Essential (primary) hypertension: Secondary | ICD-10-CM | POA: Insufficient documentation

## 2014-03-21 DIAGNOSIS — Z79899 Other long term (current) drug therapy: Secondary | ICD-10-CM | POA: Diagnosis not present

## 2014-03-21 DIAGNOSIS — E780 Pure hypercholesterolemia: Secondary | ICD-10-CM | POA: Diagnosis not present

## 2014-03-21 DIAGNOSIS — H26492 Other secondary cataract, left eye: Secondary | ICD-10-CM | POA: Insufficient documentation

## 2014-03-21 DIAGNOSIS — Z794 Long term (current) use of insulin: Secondary | ICD-10-CM | POA: Diagnosis not present

## 2014-03-21 DIAGNOSIS — M1990 Unspecified osteoarthritis, unspecified site: Secondary | ICD-10-CM | POA: Insufficient documentation

## 2014-03-21 DIAGNOSIS — E119 Type 2 diabetes mellitus without complications: Secondary | ICD-10-CM | POA: Diagnosis not present

## 2014-03-21 DIAGNOSIS — H26499 Other secondary cataract, unspecified eye: Secondary | ICD-10-CM | POA: Diagnosis present

## 2014-03-21 HISTORY — PX: YAG LASER APPLICATION: SHX6189

## 2014-03-21 SURGERY — TREATMENT, USING YAG LASER
Anesthesia: LOCAL | Laterality: Left

## 2014-03-21 MED ORDER — TROPICAMIDE 1 % OP SOLN
1.0000 [drp] | OPHTHALMIC | Status: AC
  Administered 2014-03-21 (×2): 1 [drp] via OPHTHALMIC
  Filled 2014-03-21: qty 3

## 2014-03-21 NOTE — H&P (Signed)
The patient was re examined and there is no change in the patients condition since the original H and P. 

## 2014-03-21 NOTE — Discharge Instructions (Signed)
[name]  03/21/2014     Instructions    Activity: No Restrictions.   Diet: Resume Diet you were on at home.   Pain Medication: Tylenol if Needed.   CONTACT YOUR DOCTOR IF YOU HAVE PAIN, REDNESS IN YOUR EYE, OR DECREASED VISION.   Follow-up: today between 1:00-2:00   with Rutherford Guys, MD.   Dr. Gershon Crane: [phone]      If you find that you cannot contact your physician, but feel that your signs and   Symptoms warrant a physician's attention, call the Emergency Room at   [phone] [phone].

## 2014-03-21 NOTE — Op Note (Signed)
Taneisha Fuson T. Gershon Crane, MD  Procedure: Yag Capsulotomy  Yag Laser Self Test Completedyes. Procedure: Posterior Capsulotomy, Eye Protection Worn by Staff yes. Laser In Use Sign on Door yes.  Laser: Nd:YAG Spot Size: Fixed Burst Mode: III Power Setting: 3.2 mJ/burst Number of shots: 9 Total energy delivered: 27.81 mJ   The patient tolerated the procedure without difficulty. No complications were encountered.   The patient was discharged home with the instructions to continue all her current glaucoma medications, if any.   Patient instructed to go to office at 0100 for intraocular pressure check.  Patient verbalizes understanding of discharge instructions Yes.  .    Pre-Operative Diagnosis: After-Cataract, obscuring vision, 366.53 OS Post-Operative Diagnosis: After-Cataract, obscuring vision, 366.53 OS

## 2014-03-22 ENCOUNTER — Encounter (HOSPITAL_COMMUNITY): Payer: Self-pay | Admitting: Ophthalmology

## 2014-04-10 ENCOUNTER — Encounter: Payer: Self-pay | Admitting: *Deleted

## 2014-04-14 ENCOUNTER — Ambulatory Visit: Payer: Medicare HMO | Admitting: Cardiovascular Disease

## 2014-04-27 ENCOUNTER — Encounter: Payer: Self-pay | Admitting: Cardiovascular Disease

## 2014-04-27 ENCOUNTER — Ambulatory Visit (INDEPENDENT_AMBULATORY_CARE_PROVIDER_SITE_OTHER): Payer: Commercial Managed Care - HMO | Admitting: Cardiovascular Disease

## 2014-04-27 VITALS — BP 180/70 | HR 59 | Ht 62.0 in | Wt 198.6 lb

## 2014-04-27 DIAGNOSIS — G4731 Primary central sleep apnea: Secondary | ICD-10-CM

## 2014-04-27 DIAGNOSIS — I2510 Atherosclerotic heart disease of native coronary artery without angina pectoris: Secondary | ICD-10-CM

## 2014-04-27 DIAGNOSIS — I2583 Coronary atherosclerosis due to lipid rich plaque: Secondary | ICD-10-CM

## 2014-04-27 DIAGNOSIS — I340 Nonrheumatic mitral (valve) insufficiency: Secondary | ICD-10-CM | POA: Diagnosis not present

## 2014-04-27 DIAGNOSIS — G4737 Central sleep apnea in conditions classified elsewhere: Secondary | ICD-10-CM

## 2014-04-27 DIAGNOSIS — G4733 Obstructive sleep apnea (adult) (pediatric): Secondary | ICD-10-CM

## 2014-04-27 NOTE — Patient Instructions (Signed)
Your physician wants you to follow-up in: 1 year with Dr. Claiborne Billings for sleep clinic. You will receive a reminder letter in the mail two months in advance. If you don't receive a letter, please call our office to schedule the follow-up appointment.   [facility].

## 2014-04-29 ENCOUNTER — Encounter: Payer: Self-pay | Admitting: Cardiovascular Disease

## 2014-04-29 DIAGNOSIS — I2510 Atherosclerotic heart disease of native coronary artery without angina pectoris: Secondary | ICD-10-CM | POA: Insufficient documentation

## 2014-04-29 DIAGNOSIS — I340 Nonrheumatic mitral (valve) insufficiency: Secondary | ICD-10-CM | POA: Insufficient documentation

## 2014-04-29 DIAGNOSIS — G4731 Primary central sleep apnea: Secondary | ICD-10-CM | POA: Insufficient documentation

## 2014-04-29 NOTE — Progress Notes (Addendum)
Patient ID: [name], female   DOB: May 25, 1945, 69 y.o.   MRN: [id number]     HPI: [name], is a 69 y.o. female who is referred by Dr. Gwendel Hanson for sleep clinic evaluation.  [name] is a 69 year old female who was diagnosed with sleep apnea in February 2013 when she was referred for sleep study for evaluation of nonrestorative sleep and loud snoring.  At that time.  She also had witnessed apnea, was gasping for breath and her symptoms had persisted for over 2 years.  She was found to have moderate obstructive sleep apnea.  Overall, with an HIV 18.5 per hour.  However, with rim sleep.  Events were severe, with an HI 55.7 per hour.  She has significant oxygen desaturation to 70% with non-REM sleep and 64% with rim sleep.  She had moderate snoring.  She underwent a CPAP titration and with CPAP therapy due to development of central events, and the need for higher pressures.  She ultimately required BiPAP therapy.  Previously she had been followed by [name] and had switched to [name] in January 2015 for her MDE company.  She has not been able to get any supplies.  She has noticed a significant "whistle "her mask  I was able to obtain a download of her unit from 03/28/2014 through 04/26/2014.  She is meeting Medicare compliance with use of 100%.  She is averaging 7 hours and 22 minutes per day and is currently set at an IPAP pressure of 19 and an EPAP pressure of 15.  Her AHI is very good at 4.0.  She set at a ramp time of 30 minutes.  She has a nasal mask, F&P Zest nasal mask.  She typically goes to bed at a 11:30 p.m. and wakes up at 6 AM for a sleep duration of only 6-1/2 hours.  She continues to have daytime sleepiness.  Epworth scale was recalculated in the office today as shown below:  Epworth Sleepiness Scale: Situation   Chance of Dozing/Sleeping (0 = never , 1 = slight chance , 2 = moderate chance , 3 = high chance )   sitting and reading 3   watching TV 3   sitting inactive in a  public place 0   being a passenger in a motor vehicle for an hour or more 3   lying down in the afternoon 3   sitting and talking to someone 0   sitting quietly after lunch (no alcohol) 3   while stopped for a few minutes in traffic as the driver 0   Total Score  15    Past Medical History  Diagnosis Date  . Diabetes mellitus without complication   . Hypertension   . Hyperlipemia   . CHF (congestive heart failure)   . Thyroid disease hypothyroid  . Renal disorder Kidney disease stage2  . Obstructive sleep apnea   . Peripheral neuropathy   . Vitamin D deficiency   . Mitral valve disorders     Past Surgical History  Procedure Laterality Date  . Left and right heart catheterization with coronary angiogram N/A 09/30/2011    Procedure: LEFT AND RIGHT HEART CATHETERIZATION WITH CORONARY ANGIOGRAM;  Surgeon: [name] Page, MD;  Location: [facility] CATH LAB;  Service: Cardiovascular;  Laterality: N/A;  . Yag laser application Right 0/86/7619    Procedure: YAG LASER APPLICATION;  Surgeon: [name] Guadeloupe T. Gershon Crane, MD;  Location: [facility] ORS;  Service: Ophthalmology;  Laterality: Right;  pt knows to arrive at  11:15  . Yag laser application Left 01/14/9415    Procedure: YAG LASER APPLICATION;  Surgeon: [name] Guys, MD;  Location: [facility] ORS;  Service: Ophthalmology;  Laterality: Left;    No Known Allergies  Current Outpatient Prescriptions  Medication Sig Dispense Refill  . aspirin EC 81 MG tablet Take 81 mg by mouth daily.    . cholecalciferol (VITAMIN D) 1000 UNITS tablet Take 1,000 Units by mouth 2 (two) times daily.     . fenofibrate 160 MG tablet Take 160 mg by mouth daily.    . furosemide (LASIX) 40 MG tablet Take 40 mg by mouth daily.    Marland Kitchen HUMULIN 70/30 (70-30) 100 UNIT/ML injection Inject 55-65 Units into the skin 2 (two) times daily with a meal. 60 units in the morning, 50 units at suupper    . isosorbide mononitrate (IMDUR) 30 MG 24 hr tablet Take 1 tablet (30 mg total) by mouth daily. 30 tablet 0    . levothyroxine (SYNTHROID, LEVOTHROID) 50 MCG tablet Take 50 mcg by mouth daily.    . meclizine (ANTIVERT) 25 MG tablet Take 1 tablet (25 mg total) by mouth 3 (three) times daily as needed. (Patient taking differently: Take 25 mg by mouth 3 (three) times daily as needed for dizziness. ) 30 tablet 0  . metFORMIN (GLUCOPHAGE) 1000 MG tablet Take 1 tablet (1,000 mg total) by mouth 2 (two) times daily with a meal.    . metoprolol (LOPRESSOR) 100 MG tablet Take 100 mg by mouth 2 (two) times daily.    . Omega-3 Fatty Acids (FISH OIL) 1200 MG CAPS Take 1 capsule by mouth 3 (three) times daily.    . pravastatin (PRAVACHOL) 40 MG tablet Take 40 mg by mouth every evening.    . valsartan-hydrochlorothiazide (DIOVAN-HCT) 320-12.5 MG per tablet Take 1 tablet by mouth daily.     No current facility-administered medications for this visit.    History   Social History  . Marital Status: Married    Spouse Name: N/A  . Number of Children: N/A  . Years of Education: N/A   Occupational History  . Not on file.   Social History Main Topics  . Smoking status: Never Smoker   . Smokeless tobacco: Never Used  . Alcohol Use: No  . Drug Use: Not on file  . Sexual Activity: Not on file   Other Topics Concern  . Not on file   Social History Narrative   Social history is notable: She is married, has 3 children 5 grandchildren.  Family History  Problem Relation Age of Onset  . Family history unknown: Yes   Family history is notable that her mother died at age 39 and had diabetes and heart disease.  Father died of stroke at age 61.  There are total of 6 siblings, 3 brothers and 3 sisters of which 2 brothers and 2 sisters are deceased.  One sister who is deceased, had sleep apnea but was untreated.  One brother at heart disease is deceased and had sleep apnea and was noncompliant with his CPAP use.  ROS General: Negative; No fevers, chills, or night sweats.  Positive for moderate obesity HEENT:  Negative; No changes in vision or hearing, sinus congestion, difficulty swallowing Pulmonary: Negative; No cough, wheezing, shortness of breath, hemoptysis Cardiovascular: Negative; No chest pain, presyncope, syncope, palpatations GI: Negative; No nausea, vomiting, diarrhea, or abdominal pain GU: Negative; No dysuria, hematuria, or difficulty voiding Musculoskeletal: Negative; no myalgias, joint pain, or weakness Hematologic: Negative; no  easy bruising, bleeding Endocrine: Negative; no heat/cold intolerance Neuro: Negative; no changes in balance, headaches Skin: Negative; No rashes or skin lesions Psychiatric: Negative; No behavioral problems, depression Sleep:  obstructive sleep apnea on BiPAP.  See history of present illness   Physical Exam BP 180/70 mmHg  Pulse 59  Ht _0  (1.575 m)  Wt 198 lb 9.6 oz (90.084 kg)  BMI 36.32 kg/m2  Repeat blood pressure 130/74  Wt Readings from Last 3 Encounters:  04/27/14 198 lb 9.6 oz (90.084 kg)  06/02/12 208 lb 4.8 oz (94.484 kg)  09/30/11 207 lb (93.895 kg)   General: Alert, oriented, no distress.  Skin: normal turgor, no rashes HEENT: Normocephalic, atraumatic. Pupils round and reactive; sclera anicteric; extraocular muscles intact; Fundi without hemorrhages or exudates Nose without nasal septal hypertrophy Mouth/Parynx benign; Mallinpatti scale 3 Neck: No JVD, no carotid briuts Lungs: clear to ausculatation and percussion; no wheezing or rales  Chest wall: No tenderness to palpation Heart: RRR, s1 s2 normal;  A 2/6 systolic murmur at the apex.  No S3 gallop.  No rubs thrills or heaves. Abdomen: Central adiposity soft, nontender; no hepatosplenomehaly, BS+; abdominal aorta nontender and not dilated by palpation. Back: No CVA tenderness Pulses 2+ Extremities: no clubbinbg cyanosis or edema, Homan's sign negative  Neurologic: grossly nonfocal; cranial nerves intact. Psychological: Normal affect and mood.  ECG not done in the office  since the patient is followed by Dr. Einar Gip.  LABS:  BMP Latest Ref Rng 02/12/2014 06/02/2012 06/01/2012  Glucose 70 - 99 mg/dL 202(H) 190(H) 193(H)  BUN 6 - 23 mg/dL 15 36(H) 36(H)  Creatinine 0.50 - 1.10 mg/dL 1.00 1.37(H) 1.39(H)  Sodium 135 - 145 mmol/L 137 134(L) 134(L)  Potassium 3.5 - 5.1 mmol/L 4.2 3.9 3.8  Chloride 96 - 112 mmol/L 100 93(L) 92(L)  CO2 19 - 32 mmol/L _1 Calcium 8.4 - 10.5 mg/dL 10.0 9.0 9.3     Hepatic Function Latest Ref Rng 06/01/2012 05/31/2012 05/30/2012  Total Protein 6.0 - 8.3 g/dL 7.0 6.9 7.3  Albumin 3.5 - 5.2 g/dL 3.0(L) 3.0(L) 3.3(L)  AST 0 - 37 U/L 36 52(H) 36  ALT 0 - 35 U/L 56(H) 65(H) 62(H)  Alk Phosphatase 39 - 117 U/L 27(L) 25(L) 28(L)  Total Bilirubin 0.3 - 1.2 mg/dL 0.4 0.5 0.4     CBC Latest Ref Rng 02/12/2014 06/02/2012 06/01/2012  WBC 4.0 - 10.5 K/uL 7.8 7.7 9.7  Hemoglobin 12.0 - 15.0 g/dL 13.4 11.4(L) 11.3(L)  Hematocrit 36.0 - 46.0 % 40.6 35.6(L) 35.3(L)  Platelets 150 - 400 K/uL 171 145(L) 143(L)     Lipid Panel     Component Value Date/Time   CHOL  01/01/2009 0401    144        ATP III CLASSIFICATION:  <200     mg/dL   Desirable  200-239  mg/dL   Borderline High  >=240    mg/dL   High          TRIG 344* 01/01/2009 0401   HDL 33* 01/01/2009 0401   CHOLHDL 4.4 01/01/2009 0401   VLDL 69* 01/01/2009 0401   LDLCALC  01/01/2009 0401    42        Total Cholesterol/HDL:CHD Risk Coronary Heart Disease Risk Table                     Men   Women  1/2 Average Risk   3.4   3.3  Average Risk  5.0   4.4  2 X Average Risk   9.6   7.1  3 X Average Risk  23.4   11.0        Use the calculated Patient Ratio above and the CHD Risk Table to determine the patient's CHD Risk.        ATP III CLASSIFICATION (LDL):  <100     mg/dL   Optimal  100-129  mg/dL   Near or Above                    Optimal  130-159  mg/dL   Borderline  160-189  mg/dL   High  >190     mg/dL   Very High     RADIOLOGY: No results  found.    ASSESSMENT AND PLAN: Ms. sherrin stahle is a 69 year old female who has documented to have complex sleep apnea with both obstructive and central events and has been on BiPAP therapy since 2013.  She has significant cardiovascular comorbidities and I have personally reviewed the office notes of Dr.Ganji which outlines her past medical history, multiple medical problems and cardiovascular testing.  She is recently having difficulty with her mask making a whistling sound most likely due to inadequate cushion and mask leak.  I reviewed her download on her current unit.  She is continuing to be a compliant by Medicare guidelines.  She continues to have significant residual daytime sleepiness and is sleeping only 6-1/2 hours per night, which may be contributing to some of her problem.  Her current insurance is [employer] and this has limited her in M.D. company's who take this insurance.  She is in need for new supplies.  She has derive significant benefit from her BiPAP therapy and with reference to her cardiac comorbidities, requires continued use of treatment.  I will refer her to a new MDE company which hopefully will be able to provide her with supplies.  I suspect her machine is still very functional and that she does not need a new BiPAP unit.    Troy Sine, MD, [name]  04/29/2014 1:09 PM

## 2015-04-29 ENCOUNTER — Encounter (HOSPITAL_COMMUNITY): Payer: Self-pay | Admitting: Emergency Medicine

## 2015-04-29 ENCOUNTER — Emergency Department (HOSPITAL_COMMUNITY): Payer: Commercial Managed Care - HMO

## 2015-04-29 ENCOUNTER — Encounter (HOSPITAL_COMMUNITY)
Admission: EM | Disposition: A | Discharge status: Home / Self Care | Payer: Self-pay | Source: Home / Self Care | Attending: Internal Medicine

## 2015-04-29 ENCOUNTER — Ambulatory Visit (HOSPITAL_COMMUNITY): Admit: 2015-04-29 | Payer: Self-pay | Admitting: Cardiology

## 2015-04-29 ENCOUNTER — Inpatient Hospital Stay (HOSPITAL_COMMUNITY)
Admission: EM | Admit: 2015-04-29 | Discharge: 2015-05-02 | DRG: 243 | Disposition: A | Discharge status: Home / Self Care | Payer: Commercial Managed Care - HMO | Attending: Internal Medicine | Admitting: Internal Medicine

## 2015-04-29 DIAGNOSIS — Z886 Allergy status to analgesic agent status: Secondary | ICD-10-CM

## 2015-04-29 DIAGNOSIS — E1142 Type 2 diabetes mellitus with diabetic polyneuropathy: Secondary | ICD-10-CM | POA: Diagnosis present

## 2015-04-29 DIAGNOSIS — B961 Klebsiella pneumoniae [K. pneumoniae] as the cause of diseases classified elsewhere: Secondary | ICD-10-CM | POA: Diagnosis present

## 2015-04-29 DIAGNOSIS — Z7901 Long term (current) use of anticoagulants: Secondary | ICD-10-CM

## 2015-04-29 DIAGNOSIS — Z7984 Long term (current) use of oral hypoglycemic drugs: Secondary | ICD-10-CM

## 2015-04-29 DIAGNOSIS — N182 Chronic kidney disease, stage 2 (mild): Secondary | ICD-10-CM | POA: Diagnosis present

## 2015-04-29 DIAGNOSIS — E119 Type 2 diabetes mellitus without complications: Secondary | ICD-10-CM

## 2015-04-29 DIAGNOSIS — N189 Chronic kidney disease, unspecified: Secondary | ICD-10-CM

## 2015-04-29 DIAGNOSIS — E1165 Type 2 diabetes mellitus with hyperglycemia: Secondary | ICD-10-CM | POA: Diagnosis present

## 2015-04-29 DIAGNOSIS — I5031 Acute diastolic (congestive) heart failure: Secondary | ICD-10-CM

## 2015-04-29 DIAGNOSIS — Z794 Long term (current) use of insulin: Secondary | ICD-10-CM | POA: Diagnosis not present

## 2015-04-29 DIAGNOSIS — I5032 Chronic diastolic (congestive) heart failure: Secondary | ICD-10-CM | POA: Diagnosis present

## 2015-04-29 DIAGNOSIS — I442 Atrioventricular block, complete: Secondary | ICD-10-CM | POA: Diagnosis present

## 2015-04-29 DIAGNOSIS — Z6836 Body mass index (BMI) 36.0-36.9, adult: Secondary | ICD-10-CM | POA: Diagnosis not present

## 2015-04-29 DIAGNOSIS — Z79899 Other long term (current) drug therapy: Secondary | ICD-10-CM | POA: Diagnosis not present

## 2015-04-29 DIAGNOSIS — I2510 Atherosclerotic heart disease of native coronary artery without angina pectoris: Secondary | ICD-10-CM | POA: Diagnosis present

## 2015-04-29 DIAGNOSIS — I495 Sick sinus syndrome: Principal | ICD-10-CM | POA: Diagnosis present

## 2015-04-29 DIAGNOSIS — E782 Mixed hyperlipidemia: Secondary | ICD-10-CM | POA: Diagnosis present

## 2015-04-29 DIAGNOSIS — G4733 Obstructive sleep apnea (adult) (pediatric): Secondary | ICD-10-CM | POA: Diagnosis present

## 2015-04-29 DIAGNOSIS — Z888 Allergy status to other drugs, medicaments and biological substances status: Secondary | ICD-10-CM

## 2015-04-29 DIAGNOSIS — E669 Obesity, unspecified: Secondary | ICD-10-CM | POA: Diagnosis present

## 2015-04-29 DIAGNOSIS — Z950 Presence of cardiac pacemaker: Secondary | ICD-10-CM

## 2015-04-29 DIAGNOSIS — E039 Hypothyroidism, unspecified: Secondary | ICD-10-CM | POA: Diagnosis present

## 2015-04-29 DIAGNOSIS — N179 Acute kidney failure, unspecified: Secondary | ICD-10-CM | POA: Diagnosis present

## 2015-04-29 DIAGNOSIS — N390 Urinary tract infection, site not specified: Secondary | ICD-10-CM | POA: Diagnosis present

## 2015-04-29 DIAGNOSIS — I2583 Coronary atherosclerosis due to lipid rich plaque: Secondary | ICD-10-CM

## 2015-04-29 DIAGNOSIS — I480 Paroxysmal atrial fibrillation: Secondary | ICD-10-CM | POA: Diagnosis present

## 2015-04-29 DIAGNOSIS — I248 Other forms of acute ischemic heart disease: Secondary | ICD-10-CM | POA: Diagnosis present

## 2015-04-29 DIAGNOSIS — K921 Melena: Secondary | ICD-10-CM

## 2015-04-29 DIAGNOSIS — I130 Hypertensive heart and chronic kidney disease with heart failure and stage 1 through stage 4 chronic kidney disease, or unspecified chronic kidney disease: Secondary | ICD-10-CM | POA: Diagnosis present

## 2015-04-29 DIAGNOSIS — D72829 Elevated white blood cell count, unspecified: Secondary | ICD-10-CM

## 2015-04-29 DIAGNOSIS — I4891 Unspecified atrial fibrillation: Secondary | ICD-10-CM | POA: Diagnosis present

## 2015-04-29 DIAGNOSIS — I340 Nonrheumatic mitral (valve) insufficiency: Secondary | ICD-10-CM | POA: Diagnosis present

## 2015-04-29 DIAGNOSIS — R0602 Shortness of breath: Secondary | ICD-10-CM | POA: Diagnosis present

## 2015-04-29 DIAGNOSIS — R42 Dizziness and giddiness: Secondary | ICD-10-CM

## 2015-04-29 DIAGNOSIS — E118 Type 2 diabetes mellitus with unspecified complications: Secondary | ICD-10-CM | POA: Diagnosis not present

## 2015-04-29 DIAGNOSIS — E1122 Type 2 diabetes mellitus with diabetic chronic kidney disease: Secondary | ICD-10-CM | POA: Diagnosis present

## 2015-04-29 DIAGNOSIS — G4731 Primary central sleep apnea: Secondary | ICD-10-CM

## 2015-04-29 HISTORY — PX: CARDIAC CATHETERIZATION: SHX172

## 2015-04-29 LAB — URINALYSIS, ROUTINE W REFLEX MICROSCOPIC
BILIRUBIN URINE: NEGATIVE
GLUCOSE, UA: NEGATIVE mg/dL
HGB URINE DIPSTICK: NEGATIVE
Ketones, ur: NEGATIVE mg/dL
Nitrite: POSITIVE — AB
Protein, ur: NEGATIVE mg/dL
SPECIFIC GRAVITY, URINE: 1.01 (ref 1.005–1.030)
pH: 6 (ref 5.0–8.0)

## 2015-04-29 LAB — I-STAT TROPONIN, ED: TROPONIN I, POC: 0.04 ng/mL (ref 0.00–0.08)

## 2015-04-29 LAB — CBC
HCT: 41.7 % (ref 36.0–46.0)
HEMOGLOBIN: 13.8 g/dL (ref 12.0–15.0)
MCH: 29.1 pg (ref 26.0–34.0)
MCHC: 33.1 g/dL (ref 30.0–36.0)
MCV: 87.8 fL (ref 78.0–100.0)
PLATELETS: 169 10*3/uL (ref 150–400)
RBC: 4.75 MIL/uL (ref 3.87–5.11)
RDW: 14.1 % (ref 11.5–15.5)
WBC: 9.8 10*3/uL (ref 4.0–10.5)

## 2015-04-29 LAB — TSH
TSH: 1.145 u[IU]/mL (ref 0.350–4.500)
TSH: 1.05 u[IU]/mL (ref 0.350–4.500)

## 2015-04-29 LAB — MRSA PCR SCREENING: MRSA BY PCR: NEGATIVE

## 2015-04-29 LAB — BASIC METABOLIC PANEL
ANION GAP: 15 (ref 5–15)
BUN: 12 mg/dL (ref 6–20)
CO2: 28 mmol/L (ref 22–32)
Calcium: 10 mg/dL (ref 8.9–10.3)
Chloride: 94 mmol/L — ABNORMAL LOW (ref 101–111)
Creatinine, Ser: 1.31 mg/dL — ABNORMAL HIGH (ref 0.44–1.00)
GFR calc Af Amer: 46 mL/min — ABNORMAL LOW (ref >60)
GFR, EST NON AFRICAN AMERICAN: 40 mL/min — AB (ref >60)
GLUCOSE: 334 mg/dL — AB (ref 65–99)
Potassium: 3.9 mmol/L (ref 3.5–5.1)
SODIUM: 137 mmol/L (ref 135–145)

## 2015-04-29 LAB — URINE MICROSCOPIC-ADD ON
RBC / HPF: NONE SEEN RBC/hpf (ref 0–5)
WBC UA: NONE SEEN WBC/hpf (ref 0–5)

## 2015-04-29 LAB — DIGOXIN LEVEL: Digoxin Level: 1.2 ng/mL (ref 0.8–2.0)

## 2015-04-29 LAB — GLUCOSE, CAPILLARY: GLUCOSE-CAPILLARY: 264 mg/dL — AB (ref 65–99)

## 2015-04-29 LAB — PROTIME-INR
INR: 2.66 — AB (ref 0.00–1.49)
PROTHROMBIN TIME: 28 s — AB (ref 11.6–15.2)

## 2015-04-29 LAB — BRAIN NATRIURETIC PEPTIDE: B Natriuretic Peptide: 198.8 pg/mL — ABNORMAL HIGH (ref 0.0–100.0)

## 2015-04-29 LAB — TROPONIN I: Troponin I: 0.05 ng/mL — ABNORMAL HIGH (ref <0.031)

## 2015-04-29 SURGERY — TEMPORARY PACEMAKER

## 2015-04-29 MED ORDER — INSULIN ASPART PROT & ASPART (70-30 MIX) 100 UNIT/ML ~~LOC~~ SUSP: 35.0000 [IU] | Freq: Two times a day (BID) | SUBCUTANEOUS | Status: DC

## 2015-04-29 MED ORDER — ONDANSETRON HCL 4 MG PO TABS: 4.0000 mg | ORAL_TABLET | Freq: Four times a day (QID) | ORAL | Status: DC | PRN

## 2015-04-29 MED ORDER — OMEGA-3-ACID ETHYL ESTERS 1 G PO CAPS
1.0000 g | ORAL_CAPSULE | Freq: Three times a day (TID) | ORAL | Status: DC
  Administered 2015-04-29 – 2015-05-02 (×7): 1 g via ORAL
  Filled 2015-04-29 (×8): qty 1

## 2015-04-29 MED ORDER — PHYTONADIONE 5 MG PO TABS
5.0000 mg | ORAL_TABLET | Freq: Once | ORAL | Status: AC
  Administered 2015-04-29: 5 mg via ORAL
  Filled 2015-04-29: qty 1

## 2015-04-29 MED ORDER — ACETAMINOPHEN 325 MG PO TABS: 650.0000 mg | ORAL_TABLET | Freq: Four times a day (QID) | ORAL | Status: DC | PRN

## 2015-04-29 MED ORDER — ONDANSETRON HCL 4 MG/2ML IJ SOLN
4.0000 mg | Freq: Four times a day (QID) | INTRAMUSCULAR | Status: DC | PRN
Start: 2015-04-29 — End: 2015-04-30

## 2015-04-29 MED ORDER — VALSARTAN-HYDROCHLOROTHIAZIDE 320-12.5 MG PO TABS: 1.0000 | ORAL_TABLET | Freq: Every day | ORAL | Status: DC

## 2015-04-29 MED ORDER — CETYLPYRIDINIUM CHLORIDE 0.05 % MT LIQD
7.0000 mL | Freq: Two times a day (BID) | OROMUCOSAL | Status: DC
  Administered 2015-04-30 – 2015-05-01 (×2): 7 mL via OROMUCOSAL

## 2015-04-29 MED ORDER — SODIUM CHLORIDE 0.9 % IV SOLN
INTRAVENOUS | Status: DC | PRN
  Administered 2015-04-29: 10 mL/h via INTRAVENOUS

## 2015-04-29 MED ORDER — HEPARIN (PORCINE) IN NACL 2-0.9 UNIT/ML-% IJ SOLN
INTRAMUSCULAR | Status: AC
  Filled 2015-04-29: qty 500

## 2015-04-29 MED ORDER — SODIUM CHLORIDE 0.9 % IV SOLN
INTRAVENOUS | Status: DC
  Administered 2015-04-29: 19:00:00 via INTRAVENOUS

## 2015-04-29 MED ORDER — LORAZEPAM 0.5 MG PO TABS: 0.5000 mg | ORAL_TABLET | Freq: Three times a day (TID) | ORAL | Status: DC | PRN

## 2015-04-29 MED ORDER — SODIUM CHLORIDE 0.9 % IV SOLN
100.0000 mg | INTRAVENOUS | Status: DC
  Filled 2015-04-29: qty 100

## 2015-04-29 MED ORDER — SODIUM CHLORIDE 0.9% FLUSH
3.0000 mL | Freq: Two times a day (BID) | INTRAVENOUS | Status: DC
  Administered 2015-05-01 (×2): 3 mL via INTRAVENOUS

## 2015-04-29 MED ORDER — WARFARIN - PHARMACIST DOSING INPATIENT
Freq: Every day | Status: DC
  Administered 2015-04-29: 18:00:00

## 2015-04-29 MED ORDER — HYDROMORPHONE HCL 1 MG/ML IJ SOLN: 1.0000 mg | INTRAMUSCULAR | Status: DC | PRN

## 2015-04-29 MED ORDER — HYDROCHLOROTHIAZIDE 12.5 MG PO CAPS: 12.5000 mg | ORAL_CAPSULE | Freq: Every day | ORAL | Status: DC

## 2015-04-29 MED ORDER — HEPARIN (PORCINE) IN NACL 2-0.9 UNIT/ML-% IJ SOLN
INTRAMUSCULAR | Status: DC | PRN
  Administered 2015-04-29: 500 mL

## 2015-04-29 MED ORDER — FENOFIBRATE 160 MG PO TABS
160.0000 mg | ORAL_TABLET | Freq: Every day | ORAL | Status: DC
  Administered 2015-04-30 – 2015-05-01 (×2): 160 mg via ORAL
  Filled 2015-04-29 (×2): qty 1

## 2015-04-29 MED ORDER — ACETAMINOPHEN 650 MG RE SUPP: 650.0000 mg | Freq: Four times a day (QID) | RECTAL | Status: DC | PRN

## 2015-04-29 MED ORDER — INSULIN ASPART PROT & ASPART (70-30 MIX) 100 UNIT/ML ~~LOC~~ SUSP
35.0000 [IU] | Freq: Two times a day (BID) | SUBCUTANEOUS | Status: DC
  Administered 2015-04-30 – 2015-05-01 (×2): 35 [IU] via SUBCUTANEOUS
  Filled 2015-04-29 (×3): qty 10

## 2015-04-29 MED ORDER — FENTANYL CITRATE (PF) 100 MCG/2ML IJ SOLN
INTRAMUSCULAR | Status: AC
  Filled 2015-04-29: qty 2

## 2015-04-29 MED ORDER — LEVOTHYROXINE SODIUM 50 MCG PO TABS
50.0000 ug | ORAL_TABLET | Freq: Every day | ORAL | Status: DC
  Administered 2015-04-30 – 2015-05-02 (×3): 50 ug via ORAL
  Filled 2015-04-29 (×3): qty 1

## 2015-04-29 MED ORDER — ISOSORBIDE MONONITRATE ER 30 MG PO TB24
30.0000 mg | ORAL_TABLET | Freq: Every day | ORAL | Status: DC
  Administered 2015-04-29 – 2015-05-02 (×3): 30 mg via ORAL
  Filled 2015-04-29 (×4): qty 1

## 2015-04-29 MED ORDER — LEVOTHYROXINE SODIUM 50 MCG PO TABS: 50.0000 ug | ORAL_TABLET | Freq: Every day | ORAL | Status: DC

## 2015-04-29 MED ORDER — HYDROCHLOROTHIAZIDE 12.5 MG PO CAPS
12.5000 mg | ORAL_CAPSULE | Freq: Every day | ORAL | Status: DC
  Administered 2015-04-29 – 2015-05-02 (×3): 12.5 mg via ORAL
  Filled 2015-04-29 (×5): qty 1

## 2015-04-29 MED ORDER — INSULIN ASPART 100 UNIT/ML ~~LOC~~ SOLN
0.0000 [IU] | Freq: Every day | SUBCUTANEOUS | Status: DC
  Administered 2015-04-29: 3 [IU] via SUBCUTANEOUS
  Administered 2015-04-30: 4 [IU] via SUBCUTANEOUS

## 2015-04-29 MED ORDER — IRBESARTAN 300 MG PO TABS
300.0000 mg | ORAL_TABLET | Freq: Every day | ORAL | Status: DC
Start: 2015-04-29 — End: 2015-05-02
  Administered 2015-04-29 – 2015-05-02 (×3): 300 mg via ORAL
  Filled 2015-04-29 (×5): qty 1

## 2015-04-29 MED ORDER — ISOSORBIDE MONONITRATE ER 30 MG PO TB24: 30.0000 mg | ORAL_TABLET | Freq: Every day | ORAL | Status: DC

## 2015-04-29 MED ORDER — IRBESARTAN 300 MG PO TABS: 300.0000 mg | ORAL_TABLET | Freq: Every day | ORAL | Status: DC

## 2015-04-29 MED ORDER — LIDOCAINE HCL (PF) 1 % IJ SOLN
INTRAMUSCULAR | Status: AC
  Filled 2015-04-29: qty 30

## 2015-04-29 MED ORDER — FISH OIL 1000 MG PO CAPS: 1000.0000 mg | ORAL_CAPSULE | Freq: Three times a day (TID) | ORAL | Status: DC

## 2015-04-29 MED ORDER — INSULIN ASPART 100 UNIT/ML ~~LOC~~ SOLN
0.0000 [IU] | Freq: Three times a day (TID) | SUBCUTANEOUS | Status: DC
Start: 2015-04-30 — End: 2015-05-02
  Administered 2015-04-30 – 2015-05-01 (×4): 5 [IU] via SUBCUTANEOUS
  Administered 2015-05-01: 3 [IU] via SUBCUTANEOUS
  Administered 2015-05-01: 5 [IU] via SUBCUTANEOUS
  Administered 2015-05-02: 3 [IU] via SUBCUTANEOUS

## 2015-04-29 MED ORDER — WARFARIN SODIUM 2 MG PO TABS
3.0000 mg | ORAL_TABLET | Freq: Once | ORAL | Status: DC
  Filled 2015-04-29: qty 1

## 2015-04-29 MED ORDER — ANIDULAFUNGIN 100 MG IV SOLR
200.0000 mg | Freq: Once | INTRAVENOUS | Status: DC
  Filled 2015-04-29: qty 200

## 2015-04-29 MED ORDER — LIDOCAINE HCL (PF) 1 % IJ SOLN
INTRAMUSCULAR | Status: DC | PRN
  Administered 2015-04-29: 5 mL

## 2015-04-29 MED ORDER — FENTANYL CITRATE (PF) 100 MCG/2ML IJ SOLN
INTRAMUSCULAR | Status: DC | PRN
  Administered 2015-04-29: 50 ug via INTRAVENOUS

## 2015-04-29 MED ORDER — PRAVASTATIN SODIUM 40 MG PO TABS
40.0000 mg | ORAL_TABLET | Freq: Every day | ORAL | Status: DC
  Administered 2015-04-29 – 2015-05-01 (×3): 40 mg via ORAL
  Filled 2015-04-29 (×3): qty 1

## 2015-04-29 SURGICAL SUPPLY — 8 items
CATH S G BIP PACING (SET/KITS/TRAYS/PACK) ×1 IMPLANT
KIT ENCORE 26 ADVANTAGE (KITS) IMPLANT
KIT HEART LEFT (KITS) IMPLANT
PACK CARDIAC CATHETERIZATION (CUSTOM PROCEDURE TRAY) ×2 IMPLANT
SHEATH PINNACLE 6F 10CM (SHEATH) ×1 IMPLANT
SLEEVE REPOSITIONING LENGTH 30 (MISCELLANEOUS) ×1 IMPLANT
TRANSDUCER W/MONITORING KIT (MISCELLANEOUS) IMPLANT
TUBING CIL FLEX 10 FLL-RA (TUBING) IMPLANT

## 2015-04-29 NOTE — H&P (Signed)
History and Physical        Hospital Admission Note Date: 04/29/2015  Patient name: [name] Medical record number: [id number] Date of birth: 02/08/1945 Age: 70 y.o. Gender: female  PCP: Thressa Sheller, MD  Outpatient specialists: Dr Einar Gip  (Cardiology)   Referring physician: Dr Billy Fischer    Chief Complaint:  Feeling poorly, shortness of breath and heart palpitations for last 2 days  HPI: Patient is a 70 year old female with history of atrial fibrillation, insulin-dependent diabetes mellitus, hypertension, hyperlipidemia, hypothyroidism, chronic kidney disease stage II, presented to ED with shortness of breath and palpitations since yesterday. History was obtained from the patient, her son in the room. Patient reported that she was placed on Coumadin and digoxin for atrial fibrillation 3 weeks ago by her cardiologist. Her heart rate was high and irregular at the time of follow-up. Since then she has been feeling poorly, tired and short of breath. Today patient went to the church with her husband and felt as if her heart was going to stop and then it was racing. She denied any chest pain however she did have mild shortness of breath with dizziness. Patient came home and rested however her symptoms did not improve. Hence she came to the ED.  In ED, patient was noted to have atrial fibrillation with RVR with heart rate in 140s and then subsequently extreme bradycardia, sinus in the 30s. ED work-up: CBC, BMET unremarkable except creatinine 1.31, BUN 12 EKG showed heart rate of 111 with atrial fibrillation, left axis deviation Chest x-ray showed cardiomegaly with pulmonary interstitial thickening, pulmonary venous congestion, no pleural effusion or pneumothorax.   Review of Systems: Positives marked in bold Constitutional: Denies fever, chills, diaphoresis, poor appetite and  fatigue.  HEENT: Denies photophobia, eye pain, redness, hearing loss, ear pain, congestion, sore throat, rhinorrhea, sneezing, mouth sores, trouble swallowing, neck pain, neck stiffness and tinnitus.   Respiratory: Denies DOE, cough, chest tightness,  and wheezing.  + shortness of breath Cardiovascular: Please see history of present illness Gastrointestinal: nausea, denies vomiting, abdominal pain, diarrhea, constipation, blood in stool and abdominal distention.  Genitourinary: Denies dysuria, urgency, frequency, hematuria, flank pain and difficulty urinating.  Musculoskeletal: Denies myalgias, back pain, joint swelling, arthralgias and gait problem.  Skin: Denies pallor, rash and wound.  Neurological: Denies seizures, syncope, numbness and headaches.  dizziness and lightheadedness with generalized weakness+ Hematological: Denies adenopathy. Easy bruising, personal or family bleeding history  Psychiatric/Behavioral: Denies suicidal ideation, mood changes, confusion, nervousness, sleep disturbance and agitation  Past Medical History: Past Medical History  Diagnosis Date  . Diabetes mellitus without complication ([place])   . Hypertension   . Hyperlipemia   . CHF (congestive heart failure) ([place])   . Thyroid disease hypothyroid  . Renal disorder Kidney disease stage2  . Obstructive sleep apnea   . Peripheral neuropathy ([place])   . Vitamin D deficiency   . Mitral valve disorders     Past Surgical History  Procedure Laterality Date  . Left and right heart catheterization with coronary angiogram N/A 09/30/2011    Procedure: LEFT AND RIGHT HEART CATHETERIZATION WITH CORONARY ANGIOGRAM;  Surgeon: Laverda Page, MD;  Location: [facility] CATH LAB;  Service:  Cardiovascular;  Laterality: N/A;  . Yag laser application Right [id number]    Procedure: YAG LASER APPLICATION;  Surgeon: Elta Guadeloupe T. Gershon Crane, MD;  Location: [facility] ORS;  Service: Ophthalmology;  Laterality: Right;  pt knows to arrive at 11:15  . Yag laser  application Left [id number]    Procedure: YAG LASER APPLICATION;  Surgeon: Rutherford Guys, MD;  Location: [facility] ORS;  Service: Ophthalmology;  Laterality: Left;    Medications: Prior to Admission medications   Medication Sig Start Date End Date Taking? Authorizing Provider  cholecalciferol (VITAMIN D) 1000 UNITS tablet Take 2,000 Units by mouth daily.    Yes Historical Provider, MD  digoxin (LANOXIN) 0.25 MG tablet Take 250 mcg by mouth daily. 04/13/15  Yes Historical Provider, MD  fenofibrate 160 MG tablet Take 160 mg by mouth daily with supper.    Yes Historical Provider, MD  furosemide (LASIX) 40 MG tablet Take 40 mg by mouth daily.   Yes Historical Provider, MD  insulin NPH-regular Human (NOVOLIN 70/30) (70-30) 100 UNIT/ML injection Inject 35-75 Units into the skin 2 (two) times daily with a meal. Inject 75 units subcutaneously with breakfast and 35 units with supper   Yes Historical Provider, MD  isosorbide mononitrate (IMDUR) 30 MG 24 hr tablet Take 1 tablet (30 mg total) by mouth daily. Patient taking differently: Take 30 mg by mouth at bedtime.  06/02/12  Yes Domenic Polite, MD  levothyroxine (SYNTHROID, LEVOTHROID) 50 MCG tablet Take 50 mcg by mouth at bedtime.    Yes Historical Provider, MD  metFORMIN (GLUCOPHAGE) 500 MG tablet Take 1,000 mg by mouth 2 (two) times daily with a meal.   Yes Historical Provider, MD  metoprolol (LOPRESSOR) 100 MG tablet Take 100 mg by mouth 2 (two) times daily.   Yes Historical Provider, MD  Omega-3 Fatty Acids (FISH OIL) 1000 MG CAPS Take 1,000 mg by mouth 3 (three) times daily.   Yes Historical Provider, MD  pravastatin (PRAVACHOL) 40 MG tablet Take 40 mg by mouth at bedtime.    Yes Historical Provider, MD  valsartan-hydrochlorothiazide (DIOVAN-HCT) 320-12.5 MG per tablet Take 1 tablet by mouth daily.   Yes Historical Provider, MD  warfarin (COUMADIN) 3 MG tablet Take 3-6 mg by mouth daily after breakfast. Take 2 tablets (6 mg) by mouth on Tuesday and Thursday,  take 1 tablet (3 mg) on Sunday, Monday, Wednesday, Friday and Saturday 04/13/15  Yes Historical Provider, MD  meclizine (ANTIVERT) 25 MG tablet Take 1 tablet (25 mg total) by mouth 3 (three) times daily as needed. Patient not taking: Reported on 04/29/2015 02/12/14   Dalia Heading, PA-C  metFORMIN (GLUCOPHAGE) 1000 MG tablet Take 1 tablet (1,000 mg total) by mouth 2 (two) times daily with a meal. Patient not taking: Reported on 04/29/2015 10/03/11   Adrian Prows, MD  sulfamethoxazole-trimethoprim (BACTRIM,SEPTRA) 400-80 MG tablet Take 1 tablet by mouth 2 (two) times daily. 04/26/15   Historical Provider, MD    Allergies:   Allergies  Allergen Reactions  . Hydrocodone Other (See Comments)    lethargic  . Invokana [Canagliflozin] Other (See Comments)    Made heart race  . Oxycodone Other (See Comments)    lethargic    Social History:  reports that she has never smoked. She has never used smokeless tobacco. She reports that she does not drink alcohol. Her drug history is not on file.She lives at home with her husband, otherwise functional with her ADLs  Family History: Family History  Problem Relation Age of Onset  .  Family history unknown: Yes    Physical Exam: Blood pressure 151/47, pulse 52, temperature 98.1 F (36.7 C), temperature source Oral, resp. rate 16, height 5\' 2"  (1.575 m), weight 90.266 kg (199 lb), SpO2 97 %. General: Alert, awake, oriented x3, in no acute distress. HEENT: normocephalic, atraumatic, anicteric sclera, pink conjunctiva, pupils equal and reactive to light and accomodation, oropharynx clear Neck: supple, no masses or lymphadenopathy, no goiter, no bruits  Heart:  heart rate is irregular with tachycardia and then subsequent bradycardia  Lungs: Clear to auscultation bilaterally, no wheezing, rales or rhonchi. Abdomen: Soft, nontender, nondistended, positive bowel sounds, no masses. Extremities: No clubbing, cyanosis or edema with positive pedal pulses. Neuro:  Grossly intact, no focal neurological deficits, strength 5/5 upper and lower extremities bilaterally Psych: alert and oriented x 3, normal mood and affect Skin: no rashes or lesions, warm and dry   LABS on Admission:  Basic Metabolic Panel:  Recent Labs Lab 04/29/15 1521  NA 137  K 3.9  CL 94*  CO2 28  GLUCOSE 334*  BUN 12  CREATININE 1.31*  CALCIUM 10.0   Liver Function Tests: No results for input(s): AST, ALT, ALKPHOS, BILITOT, PROT, ALBUMIN in the last 168 hours. No results for input(s): LIPASE, AMYLASE in the last 168 hours. No results for input(s): AMMONIA in the last 168 hours. CBC:  Recent Labs Lab 04/29/15 1521  WBC 9.8  HGB 13.8  HCT 41.7  MCV 87.8  PLT 169   Cardiac Enzymes: No results for input(s): CKTOTAL, CKMB, CKMBINDEX, TROPONINI in the last 168 hours. BNP: Invalid input(s): POCBNP CBG: No results for input(s): GLUCAP in the last 168 hours.  Radiological Exams on Admission:  Dg Chest 2 View  04/29/2015  CLINICAL DATA:  Pt reports intermittent SOB, tachycardia, and lightheadedness x 2-3 weeks; pt reports h/o DM, HTN, AFIB, and CHF; non-smoker EXAM: CHEST  2 VIEW COMPARISON:  05/30/2012 FINDINGS: Moderate thoracic spondylosis. Right hemidiaphragm eventration anteriorly. Midline trachea. Mild cardiomegaly. No pleural effusion or pneumothorax. Pulmonary interstitial thickening is decreased since the prior exam. No lobar consolidation. IMPRESSION: Cardiomegaly with pulmonary interstitial thickening, likely related to pulmonary venous congestion. Electronically Signed   By: Abigail Miyamoto M.D.   On: 04/29/2015 16:23    *I have personally reviewed the images above*  EKG: Independently reviewed.Rate 111 with atrial fibrillation, left axis deviation  Assessment/Plan Principal Problem:   Atrial fibrillation with RVR ([place]), tachy-brady syndrome - Admit to stepdown, place pacer pads at bedside, hold AV nodal blocking drugs - Obtain TSH, serial cardiac enzymes,  digoxin level - Hold digoxin, metoprolol - Discussed in detail with Dr. Einar Gip, patient's cardiologist, who will evaluate today for further recommendations, may need pacemaker or EP eval, recommended amiodarone - For now continue warfarin, patient prefers NOAC's however reported that her pharmacy quoted $400 for pradaxa. Case management consult for co-pay for NOAC's.   Active Problems: Hypertension: Uncontrolled - Restart imdur, valsartan, HCTZ    Diabetes mellitus (HCC) type 2, insulin-dependent with renal complications - Uncontrolled, obtain hemoglobin A1c - Placed on sliding scale insulin, Insulin 70/30, 35units BID  Mild acute on chronic kidney disease stage II - Hold Lasix, digoxin - Gentle hydration and avoid fluid overload  Hypothyroidism - Obtain TSH, continue Synthroid  Hyperlipidemia - Continue fenofibrate  DVT prophylaxis: Coumadin  CODE STATUS: Discussed in detail with the patient, she opted to be full CODE STATUS  Consults called: Cardiology, Dr. Einar Gip  Family Communication: Admission, patients condition and plan of care including tests being  ordered have been discussed with the patient, son and daughter who indicates understanding and agree with the plan and Code Status  Admission status: Admit to stepdown unit  Disposition plan: Further plan will depend as patient's clinical course evolves and further radiologic and laboratory data become available. Likely home when medically ready   Time Spent on Admission: 81mins   RAI,RIPUDEEP M.D. [facility] 04/29/2015, 5:31 PM Pager: [phone]  If 7PM-7AM, please contact night-coverage [email or web address] Password [email or web address]

## 2015-04-29 NOTE — ED Provider Notes (Signed)
3:54 PM not in room  Orlie Dakin, MD 04/29/15 1554

## 2015-04-29 NOTE — ED Provider Notes (Signed)
CSN: [id number]     Arrival date & time 04/29/15  1505 History   First MD Initiated Contact with Patient 04/29/15 1540     Chief Complaint  Patient presents with  . Shortness of Breath     (Consider location/radiation/quality/duration/timing/severity/associated sxs/prior Treatment) HPI Comments: On coumadin and digoxin for atrial fibrillation with Dr. Einar Gip Lightheadedness, started all day yesterday and through night Feeling like heart is slowing "like it's going to stop" then heart racing Felt ok this AM and then went to church and symptoms started again No chest pain Mild dyspnea No other infectious symptoms, no numbness/weakness Began feeling worsening lightheadedness 1 hour prior to arrival No etoh or over the counter medicines Caffeine 2 cups coffee this AM Diagnosed 1 month ago  Patient is a 70 y.o. female presenting with shortness of breath.  Shortness of Breath   Past Medical History  Diagnosis Date  . Diabetes mellitus without complication ([place])   . Hypertension   . Hyperlipemia   . CHF (congestive heart failure) ([place])   . Thyroid disease hypothyroid  . Obstructive sleep apnea   . Peripheral neuropathy ([place])   . Vitamin D deficiency   . Mitral valve disorders   . Renal disorder Kidney disease stage2   Past Surgical History  Procedure Laterality Date  . Left and right heart catheterization with coronary angiogram N/A 09/30/2011    Procedure: LEFT AND RIGHT HEART CATHETERIZATION WITH CORONARY ANGIOGRAM;  Surgeon: Laverda Page, MD;  [facility];  Service: Cardiovascular;  Laterality: N/A;  . Yag laser application Right [id number]    Procedure: YAG LASER APPLICATION;  Surgeon: Elta Guadeloupe T. Gershon Crane, MD;  Location: [facility] ORS;  Service: Ophthalmology;  Laterality: Right;  pt knows to arrive at 11:15  . Yag laser application Left [id number]    Procedure: YAG LASER APPLICATION;  Surgeon: Rutherford Guys, MD;  Location: [facility]S;  Service: Ophthalmology;  Laterality: Left;    Family History  Problem Relation Age of Onset  . Family history unknown: Yes   Social History  Substance Use Topics  . Smoking status: Never Smoker   . Smokeless tobacco: Never Used  . Alcohol Use: No   OB History    No data available     Review of Systems  Respiratory: Positive for shortness of breath.       Allergies  Hydrocodone; Invokana; and Oxycodone  Home Medications   Prior to Admission medications   Medication Sig Start Date End Date Taking? Authorizing Provider  cholecalciferol (VITAMIN D) 1000 UNITS tablet Take 2,000 Units by mouth daily.    Yes Historical Provider, MD  digoxin (LANOXIN) 0.25 MG tablet Take 250 mcg by mouth daily. 04/13/15  Yes Historical Provider, MD  fenofibrate 160 MG tablet Take 160 mg by mouth daily with supper.    Yes Historical Provider, MD  furosemide (LASIX) 40 MG tablet Take 40 mg by mouth daily.   Yes Historical Provider, MD  insulin NPH-regular Human (NOVOLIN 70/30) (70-30) 100 UNIT/ML injection Inject 35-75 Units into the skin 2 (two) times daily with a meal. Inject 75 units subcutaneously with breakfast and 35 units with supper   Yes Historical Provider, MD  isosorbide mononitrate (IMDUR) 30 MG 24 hr tablet Take 1 tablet (30 mg total) by mouth daily. Patient taking differently: Take 30 mg by mouth at bedtime.  06/02/12  Yes Domenic Polite, MD  levothyroxine (SYNTHROID, LEVOTHROID) 50 MCG tablet Take 50 mcg by mouth at bedtime.    Yes Historical Provider,  MD  metFORMIN (GLUCOPHAGE) 500 MG tablet Take 1,000 mg by mouth 2 (two) times daily with a meal.   Yes Historical Provider, MD  metoprolol (LOPRESSOR) 100 MG tablet Take 100 mg by mouth 2 (two) times daily.   Yes Historical Provider, MD  Omega-3 Fatty Acids (FISH OIL) 1000 MG CAPS Take 1,000 mg by mouth 3 (three) times daily.   Yes Historical Provider, MD  pravastatin (PRAVACHOL) 40 MG tablet Take 40 mg by mouth at bedtime.    Yes Historical Provider, MD  valsartan-hydrochlorothiazide  (DIOVAN-HCT) 320-12.5 MG per tablet Take 1 tablet by mouth daily.   Yes Historical Provider, MD  warfarin (COUMADIN) 3 MG tablet Take 3-6 mg by mouth daily after breakfast. Take 2 tablets (6 mg) by mouth on Tuesday and Thursday, take 1 tablet (3 mg) on Sunday, Monday, Wednesday, Friday and Saturday 04/13/15  Yes Historical Provider, MD  meclizine (ANTIVERT) 25 MG tablet Take 1 tablet (25 mg total) by mouth 3 (three) times daily as needed. Patient not taking: Reported on 04/29/2015 02/12/14   Dalia Heading, PA-C  metFORMIN (GLUCOPHAGE) 1000 MG tablet Take 1 tablet (1,000 mg total) by mouth 2 (two) times daily with a meal. Patient not taking: Reported on 04/29/2015 10/03/11   Adrian Prows, MD  sulfamethoxazole-trimethoprim (BACTRIM,SEPTRA) 400-80 MG tablet Take 1 tablet by mouth 2 (two) times daily. 04/26/15   Historical Provider, MD   BP 134/56 mmHg  Pulse 76  Temp(Src) 98.4 F (36.9 C) (Oral)  Resp 19  Ht 5\' 2"  (1.575 m)  Wt 198 lb 13.7 oz (90.2 kg)  BMI 36.36 kg/m2  SpO2 99% Physical Exam  Constitutional: She is oriented to person, place, and time. She appears well-developed and well-nourished. No distress.  HENT:  Head: Normocephalic and atraumatic.  Eyes: Conjunctivae and EOM are normal.  Neck: Normal range of motion.  Cardiovascular: Regular rhythm, normal heart sounds and intact distal pulses.  Bradycardia present.  Exam reveals no gallop and no friction rub.   No murmur heard. Pulmonary/Chest: Effort normal and breath sounds normal. No respiratory distress. She has no wheezes. She has no rales.  Abdominal: Soft. She exhibits no distension. There is no tenderness. There is no guarding.  Musculoskeletal: She exhibits no edema or tenderness.  Neurological: She is alert and oriented to person, place, and time.  Skin: Skin is warm and dry. No rash noted. She is not diaphoretic. No erythema.  Nursing note and vitals reviewed.   ED Course  Procedures (including critical care time) Labs  Review Labs Reviewed  BASIC METABOLIC PANEL - Abnormal; Notable for the following:    Chloride 94 (*)    Glucose, Bld 334 (*)    Creatinine, Ser 1.31 (*)    GFR calc non Af Amer 40 (*)    GFR calc Af Amer 46 (*)    All other components within normal limits  PROTIME-INR - Abnormal; Notable for the following:    Prothrombin Time 28.0 (*)    INR 2.66 (*)    All other components within normal limits  BRAIN NATRIURETIC PEPTIDE - Abnormal; Notable for the following:    B Natriuretic Peptide 198.8 (*)    All other components within normal limits  URINALYSIS, ROUTINE W REFLEX MICROSCOPIC (NOT AT [facility]) - Abnormal; Notable for the following:    APPearance CLOUDY (*)    Nitrite POSITIVE (*)    Leukocytes, UA TRACE (*)    All other components within normal limits  TROPONIN I - Abnormal; Notable for  the following:    Troponin I 0.05 (*)    All other components within normal limits  TROPONIN I - Abnormal; Notable for the following:    Troponin I 0.08 (*)    All other components within normal limits  GLUCOSE, CAPILLARY - Abnormal; Notable for the following:    Glucose-Capillary 264 (*)    All other components within normal limits  URINE MICROSCOPIC-ADD ON - Abnormal; Notable for the following:    Squamous Epithelial / LPF 0-5 (*)    Bacteria, UA MANY (*)    All other components within normal limits  MRSA PCR SCREENING  URINE CULTURE  CBC  DIGOXIN LEVEL  TSH  TSH  PROTIME-INR  HEMOGLOBIN A1C  TROPONIN I  BASIC METABOLIC PANEL  CBC  I-STAT TROPOININ, ED    Imaging Review Dg Chest 2 View  04/29/2015  CLINICAL DATA:  Pt reports intermittent SOB, tachycardia, and lightheadedness x 2-3 weeks; pt reports h/o DM, HTN, AFIB, and CHF; non-smoker EXAM: CHEST  2 VIEW COMPARISON:  05/30/2012 FINDINGS: Moderate thoracic spondylosis. Right hemidiaphragm eventration anteriorly. Midline trachea. Mild cardiomegaly. No pleural effusion or pneumothorax. Pulmonary interstitial thickening is decreased  since the prior exam. No lobar consolidation. IMPRESSION: Cardiomegaly with pulmonary interstitial thickening, likely related to pulmonary venous congestion. Electronically Signed   By: Abigail Miyamoto M.D.   On: 04/29/2015 16:23   I have personally reviewed and evaluated these images and lab results as part of my medical decision-making.   EKG Interpretation   Date/Time:  Sunday April 29 2015 15:13:19 EDT Ventricular Rate:  111 PR Interval:    QRS Duration: 100 QT Interval:  284 QTC Calculation: 386 R Axis:   -65 Text Interpretation:  Atrial fibrillation with rapid ventricular response  Left axis deviation Septal infarct , age undetermined Marked ST  abnormality, possible lateral subendocardial injury Abnormal ECG SINCE  LAST TRACING HEART RATE HAS INCREASED Confirmed by Winfred Leeds  MD, SAM  [phone]) on 04/29/2015 3:16:04 PM      MDM   Final diagnoses:  Tachycardia-bradycardia syndrome ([place])  Atrial fibrillation with RVR ([place])  Lightheadedness   70 year old female with a history of diabetes, hypertension, hyperlipidemia, CHF, diagnosis of atrial fibrillation one month ago by Dr. Einar Gip currently on Coumadin and digoxin who presents with concern for lightheadedness and palpitations.  Patient's lab work does not show any significant changes, very mild aki however has similar Cr in past. Her rhythm on the monitor is concerning for alterations between atrial fibrillation with RVR and rate in the 140s, to sinus bradycardia with a rate in the 40s during the time that I'm in the room evaluating her. Given concern for sick sinus syndrome with lightheadedness, consulted Dr. Einar Gip who reports he will consult on the patient and recommends hospitalist admission.  The hospitalist will admit the patient, and she was placed in stepdown unit.  Gareth Morgan, MD 04/30/15 [id number]

## 2015-04-29 NOTE — ED Notes (Addendum)
Pt c/o SOB since yesterday. Pt denies cough. Pt states she was just recently put on Digoxin and coumadin and states she has been feeling poorly since. Also c/o gen weakness.

## 2015-04-29 NOTE — ED Notes (Signed)
Pt transported to xray 

## 2015-04-29 NOTE — Progress Notes (Signed)
Notified [facility] that patient is having pausing with the last one lasting 5.9 sec. She is symptomatic with this and states that it makes her feel like she is going to pass out and that she feels bad. [facility] is having cardiology to see the patient. Zoll pads have been placed on her as a precaution.

## 2015-04-29 NOTE — H&P (View-Only) (Signed)
CARDIOLOGY CONSULT NOTE  Patient ID: [name] MRN: [id number] DOB/AGE: 09-11-1945 70 y.o.  Admit date: 04/29/2015 Referring Physician  Ripu Tana Coast, MD Primary Physician:  Thressa Sheller, MD Reason for Consultation  Sick Sinus Syndrome  HPI: [name]  is a 70 y.o. female  With uncontrolled diabetes with CKD stage III, mixed hyperlipidemia, hypertension, moderate obesity and obstructive sleep apnea. She also has known mild 50-60% stenosis in the circumflex coronary artery by coronary angiography in September 2013, has chronic diastolic heart failure.  She had new onset asymptomatic A. Fibrillation with RVR on 03/08/2015. Patient did not take antioagulation as prescribed and after 2-3 office visits, started on  Warfarin on 04/19/2014 along with dig and metoprolol for A. Fib with RVR.   Now admitted with palpitations and not feeling well. On tele found to have paroxysms of RVR and also 5.9 second pauses. Patient on Metroprolol and digoxin on hold, but was started on Amiodarone 200 mg BID and received one dose today. She was Rx Amiodarone, but did not start the medication.  She feels like she is going to pass out and symptoms are correlated with bradycardia with HR dropping to 25-29/min with 3 to 6 second pauses. This morning she went to church, felt heart racing, episodes of dizziness and near syncope, marked fatigue and shortness of breath. No chest pain. No leg edema or painful swelling of lower extremities.  Past Medical History  Diagnosis Date  . Diabetes mellitus without complication ([place])   . Hypertension   . Hyperlipemia   . CHF (congestive heart failure) ([place])   . Thyroid disease hypothyroid  . Obstructive sleep apnea   . Peripheral neuropathy ([place])   . Vitamin D deficiency   . Mitral valve disorders   . Renal disorder Kidney disease stage2     Past Surgical History  Procedure Laterality Date  . Left and right heart catheterization with coronary angiogram N/A 09/30/2011    Procedure: LEFT AND RIGHT HEART CATHETERIZATION WITH CORONARY ANGIOGRAM;  Surgeon: Laverda Page, MD;  Location: [facility] CATH LAB;  Service: Cardiovascular;  Laterality: N/A;  . Yag laser application Right [id number]    Procedure: YAG LASER APPLICATION;  Surgeon: Elta Guadeloupe T. Gershon Crane, MD;  Location: [facility] ORS;  Service: Ophthalmology;  Laterality: Right;  pt knows to arrive at 11:15  . Yag laser application Left [id number]    Procedure: YAG LASER APPLICATION;  Surgeon: Rutherford Guys, MD;  Location: [facility] ORS;  Service: Ophthalmology;  Laterality: Left;     Family History  Problem Relation Age of Onset  . Family history unknown: Yes     Social History: Social History   Social History  . Marital Status: Married    Spouse Name: N/A  . Number of Children: N/A  . Years of Education: N/A   Occupational History  . Not on file.   Social History Main Topics  . Smoking status: Never Smoker   . Smokeless tobacco: Never Used  . Alcohol Use: No  . Drug Use: Not on file  . Sexual Activity: Not on file   Other Topics Concern  . Not on file   Social History Narrative     Prescriptions prior to admission  Medication Sig Dispense Refill Last Dose  . cholecalciferol (VITAMIN D) 1000 UNITS tablet Take 2,000 Units by mouth daily.    04/29/2015 at Unknown time  . digoxin (LANOXIN) 0.25 MG tablet Take 250 mcg by mouth daily.   04/29/2015 at Unknown time  . fenofibrate 160  MG tablet Take 160 mg by mouth daily with supper.    04/28/2015 at Unknown time  . furosemide (LASIX) 40 MG tablet Take 40 mg by mouth daily.   04/29/2015 at Unknown time  . insulin NPH-regular Human (NOVOLIN 70/30) (70-30) 100 UNIT/ML injection Inject 35-75 Units into the skin 2 (two) times daily with a meal. Inject 75 units subcutaneously with breakfast and 35 units with supper   04/29/2015 at am  . isosorbide mononitrate (IMDUR) 30 MG 24 hr tablet Take 1 tablet (30 mg total) by mouth daily. (Patient taking differently: Take 30 mg by mouth at  bedtime. ) 30 tablet 0 04/28/2015 at Unknown time  . levothyroxine (SYNTHROID, LEVOTHROID) 50 MCG tablet Take 50 mcg by mouth at bedtime.    04/28/2015 at Unknown time  . metFORMIN (GLUCOPHAGE) 500 MG tablet Take 1,000 mg by mouth 2 (two) times daily with a meal.   04/29/2015 at breakfast  . metoprolol (LOPRESSOR) 100 MG tablet Take 100 mg by mouth 2 (two) times daily.   04/29/2015 at 800  . Omega-3 Fatty Acids (FISH OIL) 1000 MG CAPS Take 1,000 mg by mouth 3 (three) times daily.   04/29/2015 at am  . pravastatin (PRAVACHOL) 40 MG tablet Take 40 mg by mouth at bedtime.    04/28/2015 at Unknown time  . valsartan-hydrochlorothiazide (DIOVAN-HCT) 320-12.5 MG per tablet Take 1 tablet by mouth daily.   04/29/2015 at Unknown time  . warfarin (COUMADIN) 3 MG tablet Take 3-6 mg by mouth daily after breakfast. Take 2 tablets (6 mg) by mouth on Tuesday and Thursday, take 1 tablet (3 mg) on Sunday, Monday, Wednesday, Friday and Saturday   04/29/2015 at Unknown time  . meclizine (ANTIVERT) 25 MG tablet Take 1 tablet (25 mg total) by mouth 3 (three) times daily as needed. (Patient not taking: Reported on 04/29/2015) 30 tablet 0 Not Taking at Unknown time  . metFORMIN (GLUCOPHAGE) 1000 MG tablet Take 1 tablet (1,000 mg total) by mouth 2 (two) times daily with a meal. (Patient not taking: Reported on 04/29/2015)   Not Taking at Unknown time  . sulfamethoxazole-trimethoprim (BACTRIM,SEPTRA) 400-80 MG tablet Take 1 tablet by mouth 2 (two) times daily.   not yet picked up     ROS: General: Fatigue present. No fevers/chills/night sweats Eyes: no blurry vision, diplopia, or amaurosis ENT: no sore throat or hearing loss Resp: Shortness of breath and dyspnea on exertion slightly worse than chronic for the past 2-3 days.  No cough, wheezing, or hemoptysis CV: No chest pain GI: no abdominal pain, nausea, vomiting, diarrhea, or constipation GU: no dysuria, frequency, or hematuria Skin: no rash Neuro: no headache, numbness,  tingling, or weakness of extremities Musculoskeletal: no joint pain or swelling Heme: no bleeding, DVT, or easy bruising Endo: no polydipsia or polyuria    Physical Exam: Blood pressure 152/47, pulse 58, temperature 98.1 F (36.7 C), temperature source Oral, resp. rate 17, height 5\' 2"  (1.575 m), weight 90.2 kg (198 lb 13.7 oz), SpO2 98 %.   General appearance: alert, cooperative, appears stated age, no distress, moderately obese and Short stature Lungs: clear to auscultation bilaterally Heart: regular rate and rhythm, S1, S2 normal, no murmur, click, rub or gallop Abdomen: soft, non-tender; bowel sounds normal; no masses,  no organomegaly and obese with mild pannus Extremities: extremities normal, atraumatic, no cyanosis or edema Pulses: 2+ and symmetric Neurologic: Grossly normal  Labs:   Lab Results  Component Value Date   WBC 9.8 04/29/2015   HGB  13.8 04/29/2015   HCT 41.7 04/29/2015   MCV 87.8 04/29/2015   PLT 169 04/29/2015    Recent Labs Lab 04/29/15 1521  NA 137  K 3.9  CL 94*  CO2 28  BUN 12  CREATININE 1.31*  CALCIUM 10.0  GLUCOSE 334*    Lipid Panel    Recent Labs  04/29/15 1521  BNP 198.8*    HEMOGLOBIN A1C Lab Results  Component Value Date   HGBA1C 7.5* 06/01/2012   MPG 169* 06/01/2012    Recent Labs  04/29/15 1904  TROPONINI 0.05*    Lab Results  Component Value Date   CKTOTAL 509* 05/31/2012   CKMB 6.4* 05/31/2012   TROPONINI 0.05* 04/29/2015      Recent Labs  04/29/15 1752 04/29/15 1903  TSH 1.145 1.050     Radiology: Dg Chest 2 View  04/29/2015  CLINICAL DATA:  Pt reports intermittent SOB, tachycardia, and lightheadedness x 2-3 weeks; pt reports h/o DM, HTN, AFIB, and CHF; non-smoker EXAM: CHEST  2 VIEW COMPARISON:  05/30/2012 FINDINGS: Moderate thoracic spondylosis. Right hemidiaphragm eventration anteriorly. Midline trachea. Mild cardiomegaly. No pleural effusion or pneumothorax. Pulmonary interstitial thickening is  decreased since the prior exam. No lobar consolidation. IMPRESSION: Cardiomegaly with pulmonary interstitial thickening, likely related to pulmonary venous congestion. Electronically Signed   By: Abigail Miyamoto M.D.   On: 04/29/2015 16:23    Scheduled Meds: . anidulafungin  200 mg Intravenous Once   Followed by  . [START ON 04/30/2015] anidulafungin  100 mg Intravenous Q24H  . antiseptic oral rinse  7 mL Mouth Rinse BID  . fenofibrate  160 mg Oral Q supper  . irbesartan  300 mg Oral Daily   And  . hydrochlorothiazide  12.5 mg Oral Daily  . [START ON 04/30/2015] insulin aspart  0-15 Units Subcutaneous TID WC  . insulin aspart  0-5 Units Subcutaneous QHS  . [START ON 04/30/2015] insulin aspart protamine- aspart  35 Units Subcutaneous BID WC  . isosorbide mononitrate  30 mg Oral Daily  . [START ON 04/30/2015] levothyroxine  50 mcg Oral QAC breakfast  . omega-3 acid ethyl esters  1 g Oral TID  . pravastatin  40 mg Oral QHS  . sodium chloride flush  3 mL Intravenous Q12H  . warfarin  3 mg Oral Once  . Warfarin - Pharmacist Dosing Inpatient   Does not apply q1800   Continuous Infusions: . sodium chloride 75 mL/hr at 04/29/15 1900   PRN Meds:.acetaminophen **OR** acetaminophen, HYDROmorphone (DILAUDID) injection, LORazepam, ondansetron **OR** ondansetron (ZOFRAN) IV  Echocardiogram 03/21/2015: Left ventricle cavity is normal in size. Moderate concentric hypertrophy of the left ventricle. Doppler evidence suggestive of grade III (restrictive) diastolic dysfunction, E/e' not done. There is mild anterior and anteroseptal hypokinesis. Calculated EF 45%. Left atrial cavity is severely dilated. Moderate mitral regurgitation. Trace tricuspid regurgitation. Unable to estimate PA pressure due to absence/minimal TR signal. Insignificant pericardial effusion. Compared to the study done on 02/23/13, wall motion abnormality new and EF was previously normal. Abnormal Echo.   Lexiscan myoview stress test  02/20/2014: 1. The resting electrocardiogram demonstrated normal sinus rhythm, normal resting conduction, poor R-wave progression, cannot exclude inferior infarct old, anterior infarct old. Pulmonary disease pattern. No resting arrhythmias and nonspecific ST-T changes in the inferior and lateral leads, cannot exclude ischemia. Stress EKG is nondiagnostic for ischemia as it is a Pharmacologic stress test using Lexiscan infusion. Stress symptoms included dyspnea, dizziness. 2. Myocardial perfusion imaging is normal. Overall left ventricular systolic function was  normal without regional wall motion abnormalities. The left ventricular ejection fraction was 75%. No significant change from [place]: 07/12/10.  EKG 04/29/2015: Atrial fibrillation with rapid ventricular response. Left axis deviation, left plantar fascicular block. Poor R-wave progression, cannot exclude anterior infarct old. LVH. Repolarization of mallet the versus high lateral ischemia.  Telemetry: Episodes of atrial fibrillation with 5.9 second ventricle causes. Normal sinus rhythm with episodes of junctional escape rhythm. Normal sinus rhythm with episodes of 3-5 second pauses.  ASSESSMENT AND PLAN:  1. Sick sinus syndrome 2. Symptomatic AV block of 5.9 to 6 seconds. 3. New Paroxysmal A. Fib with RVR, presently in sinus rhythm. CHA2DS2-VASc Score is 5 with yearly risk of stroke of 6.7%.  HAS-Bled score is 1 and estimated major bleeding in one year is 1.02-1.5%. Presently on Warfarin, INR 2.6. 4.  CAD native vessel without angina pectoris Cardiac catheterization on 09/30/2011: Circumflex coronary artery showed a 50-60% stenosis. There is mild 50% stenosis in the mid RCA. 5. Chronic diastolic heart failure, elevated troponin probably due to secondary troponin leak. 6. Uncontrolled type 2 diabetes mellitus without complication, with long-term current use of insulin 7. Hypertension 8. Hyperlipidemia mixed 9. Moderate obesity  Rec: I  have discussed the findings of her EKG and also her symptoms with patient and the family at the bedside, at present she needs temporary pacemaker implantation due to symptomatic bradycardia, I will have ENT evaluation done tomorrow morning for consideration for permanent pacemaker implantation. Patient only on metoprolol and digoxin for heart rate control, continues to have heart rate in 140s per minute and then also continues to have episodes of symptomatic bradycardia. Discussed the risk of pneumothorax, hemopericardium, trauma, bleeding and infection but not limited to these with temporary pacemaker implantation. Patient is willing. I will give her one dose Vit K tonight and ask pharmacy to dose heparin if necessary. Keep NPO after MN. Adrian Prows, MD 04/29/2015, 8:53 PM [facility] [place] Pager: [phone] Office: [phone] If no answer Cell [phone]

## 2015-04-29 NOTE — ED Notes (Signed)
Cardiology at bedside.

## 2015-04-29 NOTE — Progress Notes (Signed)
ANTICOAGULATION CONSULT NOTE - Initial Consult  Pharmacy Consult for warfarin Indication: atrial fibrillation  Allergies  Allergen Reactions  . Hydrocodone Other (See Comments)    lethargic  . Invokana [Canagliflozin] Other (See Comments)    Made heart race  . Oxycodone Other (See Comments)    lethargic    Patient Measurements: Height: 5\' 2"  (157.5 cm) Weight: 199 lb (90.266 kg) IBW/kg (Calculated) : 50.1   Vital Signs: Temp: 98.1 F (36.7 C) (04/16 1511) Temp Source: Oral (04/16 1511) BP: 171/78 mmHg (04/16 1730) Pulse Rate: 81 (04/16 1730)  Labs:  Recent Labs  04/29/15 1521  HGB 13.8  HCT 41.7  PLT 169  LABPROT 28.0*  INR 2.66*  CREATININE 1.31*    Estimated Creatinine Clearance: 40.6 mL/min (by C-G formula based on Cr of 1.31).   Medical History: Past Medical History  Diagnosis Date  . Diabetes mellitus without complication ([place])   . Hypertension   . Hyperlipemia   . CHF (congestive heart failure) ([place])   . Thyroid disease hypothyroid  . Renal disorder Kidney disease stage2  . Obstructive sleep apnea   . Peripheral neuropathy ([place])   . Vitamin D deficiency   . Mitral valve disorders    Assessment: 70 year old female with history of atrial fibrillation, insulin-dependent diabetes mellitus, hypertension, hyperlipidemia, hypothyroidism, chronic kidney disease stage II, presented to ED with shortness of breath and palpitations since yesterday.  Patient reports being placed on warfarin 3 weeks ago. INR today is within goal at 2.6.  Patient is currently taking 3mg  daily except 6mg  on Tuesday. Will continue this regimen.  Goal of Therapy:  INR 2-3 Monitor platelets by anticoagulation protocol: Yes   Plan:  Warfarin 3mg  tonight Daily INR  Erin Hearing PharmD., BCPS Clinical Pharmacist Pager [phone] 04/29/2015 5:50 PM

## 2015-04-29 NOTE — Consult Note (Signed)
CARDIOLOGY CONSULT NOTE  Patient ID: [name] MRN: [id number] DOB/AGE: 72-02-1945 72 y.o.  Admit date: 04/29/2015 Referring Physician  Ripu Tana Coast, MD Primary Physician:  Thressa Sheller, MD Reason for Consultation  Sick Sinus Syndrome  HPI: [name]  is a 70 y.o. female  With uncontrolled diabetes with CKD stage III, mixed hyperlipidemia, hypertension, moderate obesity and obstructive sleep apnea. She also has known mild 50-60% stenosis in the circumflex coronary artery by coronary angiography in September 2013, has chronic diastolic heart failure.  She had new onset asymptomatic A. Fibrillation with RVR on 03/08/2015. Patient did not take antioagulation as prescribed and after 2-3 office visits, started on  Warfarin on 04/19/2014 along with dig and metoprolol for A. Fib with RVR.   Now admitted with palpitations and not feeling well. On tele found to have paroxysms of RVR and also 5.9 second pauses. Patient on Metroprolol and digoxin on hold, but was started on Amiodarone 200 mg BID and received one dose today. She was Rx Amiodarone, but did not start the medication.  She feels like she is going to pass out and symptoms are correlated with bradycardia with HR dropping to 25-29/min with 3 to 6 second pauses. This morning she went to church, felt heart racing, episodes of dizziness and near syncope, marked fatigue and shortness of breath. No chest pain. No leg edema or painful swelling of lower extremities.  Past Medical History  Diagnosis Date  . Diabetes mellitus without complication ([place])   . Hypertension   . Hyperlipemia   . CHF (congestive heart failure) ([place])   . Thyroid disease hypothyroid  . Obstructive sleep apnea   . Peripheral neuropathy ([place])   . Vitamin D deficiency   . Mitral valve disorders   . Renal disorder Kidney disease stage2     Past Surgical History  Procedure Laterality Date  . Left and right heart catheterization with coronary angiogram N/A 09/30/2011    Procedure: LEFT AND RIGHT HEART CATHETERIZATION WITH CORONARY ANGIOGRAM;  Surgeon: Laverda Page, MD;  Location: [facility] CATH LAB;  Service: Cardiovascular;  Laterality: N/A;  . Yag laser application Right [id number]    Procedure: YAG LASER APPLICATION;  Surgeon: Elta Guadeloupe T. Gershon Crane, MD;  Location: [facility] ORS;  Service: Ophthalmology;  Laterality: Right;  pt knows to arrive at 11:15  . Yag laser application Left [id number]    Procedure: YAG LASER APPLICATION;  Surgeon: Rutherford Guys, MD;  Location: [facility] ORS;  Service: Ophthalmology;  Laterality: Left;     Family History  Problem Relation Age of Onset  . Family history unknown: Yes     Social History: Social History   Social History  . Marital Status: Married    Spouse Name: N/A  . Number of Children: N/A  . Years of Education: N/A   Occupational History  . Not on file.   Social History Main Topics  . Smoking status: Never Smoker   . Smokeless tobacco: Never Used  . Alcohol Use: No  . Drug Use: Not on file  . Sexual Activity: Not on file   Other Topics Concern  . Not on file   Social History Narrative     Prescriptions prior to admission  Medication Sig Dispense Refill Last Dose  . cholecalciferol (VITAMIN D) 1000 UNITS tablet Take 2,000 Units by mouth daily.    04/29/2015 at Unknown time  . digoxin (LANOXIN) 0.25 MG tablet Take 250 mcg by mouth daily.   04/29/2015 at Unknown time  . fenofibrate 160  MG tablet Take 160 mg by mouth daily with supper.    04/28/2015 at Unknown time  . furosemide (LASIX) 40 MG tablet Take 40 mg by mouth daily.   04/29/2015 at Unknown time  . insulin NPH-regular Human (NOVOLIN 70/30) (70-30) 100 UNIT/ML injection Inject 35-75 Units into the skin 2 (two) times daily with a meal. Inject 75 units subcutaneously with breakfast and 35 units with supper   04/29/2015 at am  . isosorbide mononitrate (IMDUR) 30 MG 24 hr tablet Take 1 tablet (30 mg total) by mouth daily. (Patient taking differently: Take 30 mg by mouth at  bedtime. ) 30 tablet 0 04/28/2015 at Unknown time  . levothyroxine (SYNTHROID, LEVOTHROID) 50 MCG tablet Take 50 mcg by mouth at bedtime.    04/28/2015 at Unknown time  . metFORMIN (GLUCOPHAGE) 500 MG tablet Take 1,000 mg by mouth 2 (two) times daily with a meal.   04/29/2015 at breakfast  . metoprolol (LOPRESSOR) 100 MG tablet Take 100 mg by mouth 2 (two) times daily.   04/29/2015 at 800  . Omega-3 Fatty Acids (FISH OIL) 1000 MG CAPS Take 1,000 mg by mouth 3 (three) times daily.   04/29/2015 at am  . pravastatin (PRAVACHOL) 40 MG tablet Take 40 mg by mouth at bedtime.    04/28/2015 at Unknown time  . valsartan-hydrochlorothiazide (DIOVAN-HCT) 320-12.5 MG per tablet Take 1 tablet by mouth daily.   04/29/2015 at Unknown time  . warfarin (COUMADIN) 3 MG tablet Take 3-6 mg by mouth daily after breakfast. Take 2 tablets (6 mg) by mouth on Tuesday and Thursday, take 1 tablet (3 mg) on Sunday, Monday, Wednesday, Friday and Saturday   04/29/2015 at Unknown time  . meclizine (ANTIVERT) 25 MG tablet Take 1 tablet (25 mg total) by mouth 3 (three) times daily as needed. (Patient not taking: Reported on 04/29/2015) 30 tablet 0 Not Taking at Unknown time  . metFORMIN (GLUCOPHAGE) 1000 MG tablet Take 1 tablet (1,000 mg total) by mouth 2 (two) times daily with a meal. (Patient not taking: Reported on 04/29/2015)   Not Taking at Unknown time  . sulfamethoxazole-trimethoprim (BACTRIM,SEPTRA) 400-80 MG tablet Take 1 tablet by mouth 2 (two) times daily.   not yet picked up     ROS: General: Fatigue present. No fevers/chills/night sweats Eyes: no blurry vision, diplopia, or amaurosis ENT: no sore throat or hearing loss Resp: Shortness of breath and dyspnea on exertion slightly worse than chronic for the past 2-3 days.  No cough, wheezing, or hemoptysis CV: No chest pain GI: no abdominal pain, nausea, vomiting, diarrhea, or constipation GU: no dysuria, frequency, or hematuria Skin: no rash Neuro: no headache, numbness,  tingling, or weakness of extremities Musculoskeletal: no joint pain or swelling Heme: no bleeding, DVT, or easy bruising Endo: no polydipsia or polyuria    Physical Exam: Blood pressure 152/47, pulse 58, temperature 98.1 F (36.7 C), temperature source Oral, resp. rate 17, height 5\' 2"  (1.575 m), weight 90.2 kg (198 lb 13.7 oz), SpO2 98 %.   General appearance: alert, cooperative, appears stated age, no distress, moderately obese and Short stature Lungs: clear to auscultation bilaterally Heart: regular rate and rhythm, S1, S2 normal, no murmur, click, rub or gallop Abdomen: soft, non-tender; bowel sounds normal; no masses,  no organomegaly and obese with mild pannus Extremities: extremities normal, atraumatic, no cyanosis or edema Pulses: 2+ and symmetric Neurologic: Grossly normal  Labs:   Lab Results  Component Value Date   WBC 9.8 04/29/2015   HGB  13.8 04/29/2015   HCT 41.7 04/29/2015   MCV 87.8 04/29/2015   PLT 169 04/29/2015    Recent Labs Lab 04/29/15 1521  NA 137  K 3.9  CL 94*  CO2 28  BUN 12  CREATININE 1.31*  CALCIUM 10.0  GLUCOSE 334*    Lipid Panel    Recent Labs  04/29/15 1521  BNP 198.8*    HEMOGLOBIN A1C Lab Results  Component Value Date   HGBA1C 7.5* 06/01/2012   MPG 169* 06/01/2012    Recent Labs  04/29/15 1904  TROPONINI 0.05*    Lab Results  Component Value Date   CKTOTAL 509* 05/31/2012   CKMB 6.4* 05/31/2012   TROPONINI 0.05* 04/29/2015      Recent Labs  04/29/15 1752 04/29/15 1903  TSH 1.145 1.050     Radiology: Dg Chest 2 View  04/29/2015  CLINICAL DATA:  Pt reports intermittent SOB, tachycardia, and lightheadedness x 2-3 weeks; pt reports h/o DM, HTN, AFIB, and CHF; non-smoker EXAM: CHEST  2 VIEW COMPARISON:  05/30/2012 FINDINGS: Moderate thoracic spondylosis. Right hemidiaphragm eventration anteriorly. Midline trachea. Mild cardiomegaly. No pleural effusion or pneumothorax. Pulmonary interstitial thickening is  decreased since the prior exam. No lobar consolidation. IMPRESSION: Cardiomegaly with pulmonary interstitial thickening, likely related to pulmonary venous congestion. Electronically Signed   By: Abigail Miyamoto M.D.   On: 04/29/2015 16:23    Scheduled Meds: . anidulafungin  200 mg Intravenous Once   Followed by  . [START ON 04/30/2015] anidulafungin  100 mg Intravenous Q24H  . antiseptic oral rinse  7 mL Mouth Rinse BID  . fenofibrate  160 mg Oral Q supper  . irbesartan  300 mg Oral Daily   And  . hydrochlorothiazide  12.5 mg Oral Daily  . [START ON 04/30/2015] insulin aspart  0-15 Units Subcutaneous TID WC  . insulin aspart  0-5 Units Subcutaneous QHS  . [START ON 04/30/2015] insulin aspart protamine- aspart  35 Units Subcutaneous BID WC  . isosorbide mononitrate  30 mg Oral Daily  . [START ON 04/30/2015] levothyroxine  50 mcg Oral QAC breakfast  . omega-3 acid ethyl esters  1 g Oral TID  . pravastatin  40 mg Oral QHS  . sodium chloride flush  3 mL Intravenous Q12H  . warfarin  3 mg Oral Once  . Warfarin - Pharmacist Dosing Inpatient   Does not apply q1800   Continuous Infusions: . sodium chloride 75 mL/hr at 04/29/15 1900   PRN Meds:.acetaminophen **OR** acetaminophen, HYDROmorphone (DILAUDID) injection, LORazepam, ondansetron **OR** ondansetron (ZOFRAN) IV  Echocardiogram 03/21/2015: Left ventricle cavity is normal in size. Moderate concentric hypertrophy of the left ventricle. Doppler evidence suggestive of grade III (restrictive) diastolic dysfunction, E/e' not done. There is mild anterior and anteroseptal hypokinesis. Calculated EF 45%. Left atrial cavity is severely dilated. Moderate mitral regurgitation. Trace tricuspid regurgitation. Unable to estimate PA pressure due to absence/minimal TR signal. Insignificant pericardial effusion. Compared to the study done on 02/23/13, wall motion abnormality new and EF was previously normal. Abnormal Echo.   Lexiscan myoview stress test  02/20/2014: 1. The resting electrocardiogram demonstrated normal sinus rhythm, normal resting conduction, poor R-wave progression, cannot exclude inferior infarct old, anterior infarct old. Pulmonary disease pattern. No resting arrhythmias and nonspecific ST-T changes in the inferior and lateral leads, cannot exclude ischemia. Stress EKG is nondiagnostic for ischemia as it is a Pharmacologic stress test using Lexiscan infusion. Stress symptoms included dyspnea, dizziness. 2. Myocardial perfusion imaging is normal. Overall left ventricular systolic function was  normal without regional wall motion abnormalities. The left ventricular ejection fraction was 75%. No significant change from [place]: 07/12/10.  EKG 04/29/2015: Atrial fibrillation with rapid ventricular response. Left axis deviation, left plantar fascicular block. Poor R-wave progression, cannot exclude anterior infarct old. LVH. Repolarization of mallet the versus high lateral ischemia.  Telemetry: Episodes of atrial fibrillation with 5.9 second ventricle causes. Normal sinus rhythm with episodes of junctional escape rhythm. Normal sinus rhythm with episodes of 3-5 second pauses.  ASSESSMENT AND PLAN:  1. Sick sinus syndrome 2. Symptomatic AV block of 5.9 to 6 seconds. 3. New Paroxysmal A. Fib with RVR, presently in sinus rhythm. CHA2DS2-VASc Score is 5 with yearly risk of stroke of 6.7%.  HAS-Bled score is 1 and estimated major bleeding in one year is 1.02-1.5%. Presently on Warfarin, INR 2.6. 4.  CAD native vessel without angina pectoris Cardiac catheterization on 09/30/2011: Circumflex coronary artery showed a 50-60% stenosis. There is mild 50% stenosis in the mid RCA. 5. Chronic diastolic heart failure, elevated troponin probably due to secondary troponin leak. 6. Uncontrolled type 2 diabetes mellitus without complication, with long-term current use of insulin 7. Hypertension 8. Hyperlipidemia mixed 9. Moderate obesity  Rec: I  have discussed the findings of her EKG and also her symptoms with patient and the family at the bedside, at present she needs temporary pacemaker implantation due to symptomatic bradycardia, I will have ENT evaluation done tomorrow morning for consideration for permanent pacemaker implantation. Patient only on metoprolol and digoxin for heart rate control, continues to have heart rate in 140s per minute and then also continues to have episodes of symptomatic bradycardia. Discussed the risk of pneumothorax, hemopericardium, trauma, bleeding and infection but not limited to these with temporary pacemaker implantation. Patient is willing. I will give her one dose Vit K tonight and ask pharmacy to dose heparin if necessary. Keep NPO after MN. Adrian Prows, MD 04/29/2015, 8:53 PM [facility]. [place] Pager: [phone] Office: [phone] If no answer Cell [phone]

## 2015-04-29 NOTE — Interval H&P Note (Signed)
History and Physical Interval Note:  04/29/2015 9:43 PM  [name]  has presented today for surgery, with the diagnosis of sick sinus syndrome  The various methods of treatment have been discussed with the patient and family. After consideration of risks, benefits and other options for treatment, the patient has consented to  Procedure(s): Temporary Pacemaker (N/A) as a surgical intervention .  The patient's history has been reviewed, patient examined, no change in status, stable for surgery.  I have reviewed the patient's chart and labs.  Questions were answered to the patient's satisfaction.     Adrian Prows

## 2015-04-29 NOTE — ED Notes (Addendum)
Patient returned from x-ray. A couple minutes after hooking her back up to the cardiac monitor, pt states she felt her heart rate get faster. According to cardiac monitor, pt's HR jumping between 39 and 145. Dr. Billy Fischer at bedside.

## 2015-04-30 ENCOUNTER — Encounter (HOSPITAL_COMMUNITY)
Admission: EM | Disposition: A | Discharge status: Home / Self Care | Payer: Self-pay | Source: Home / Self Care | Attending: Internal Medicine

## 2015-04-30 ENCOUNTER — Encounter (HOSPITAL_COMMUNITY): Payer: Self-pay | Admitting: Cardiology

## 2015-04-30 DIAGNOSIS — Z950 Presence of cardiac pacemaker: Secondary | ICD-10-CM | POA: Insufficient documentation

## 2015-04-30 DIAGNOSIS — I442 Atrioventricular block, complete: Secondary | ICD-10-CM

## 2015-04-30 DIAGNOSIS — I495 Sick sinus syndrome: Secondary | ICD-10-CM | POA: Insufficient documentation

## 2015-04-30 DIAGNOSIS — I4891 Unspecified atrial fibrillation: Secondary | ICD-10-CM

## 2015-04-30 HISTORY — PX: EP IMPLANTABLE DEVICE: SHX172B

## 2015-04-30 LAB — CBC
HEMATOCRIT: 40.5 % (ref 36.0–46.0)
Hemoglobin: 13 g/dL (ref 12.0–15.0)
MCH: 28.3 pg (ref 26.0–34.0)
MCHC: 32.1 g/dL (ref 30.0–36.0)
MCV: 88 fL (ref 78.0–100.0)
PLATELETS: 145 10*3/uL — AB (ref 150–400)
RBC: 4.6 MIL/uL (ref 3.87–5.11)
RDW: 14.2 % (ref 11.5–15.5)
WBC: 9.8 10*3/uL (ref 4.0–10.5)

## 2015-04-30 LAB — BASIC METABOLIC PANEL
Anion gap: 11 (ref 5–15)
BUN: 10 mg/dL (ref 6–20)
CO2: 33 mmol/L — ABNORMAL HIGH (ref 22–32)
Calcium: 9.4 mg/dL (ref 8.9–10.3)
Chloride: 96 mmol/L — ABNORMAL LOW (ref 101–111)
Creatinine, Ser: 1.02 mg/dL — ABNORMAL HIGH (ref 0.44–1.00)
GFR, EST NON AFRICAN AMERICAN: 54 mL/min — AB (ref >60)
Glucose, Bld: 202 mg/dL — ABNORMAL HIGH (ref 65–99)
POTASSIUM: 3.7 mmol/L (ref 3.5–5.1)
SODIUM: 140 mmol/L (ref 135–145)

## 2015-04-30 LAB — GLUCOSE, CAPILLARY
GLUCOSE-CAPILLARY: 305 mg/dL — AB (ref 65–99)
Glucose-Capillary: 279 mg/dL — ABNORMAL HIGH (ref 65–99)
Glucose-Capillary: 247 mg/dL — ABNORMAL HIGH (ref 65–99)
Glucose-Capillary: 232 mg/dL — ABNORMAL HIGH (ref 65–99)
Glucose-Capillary: 235 mg/dL — ABNORMAL HIGH (ref 65–99)

## 2015-04-30 LAB — TROPONIN I
TROPONIN I: 0.08 ng/mL — AB (ref <0.031)
Troponin I: 0.08 ng/mL — ABNORMAL HIGH (ref <0.031)

## 2015-04-30 LAB — PROTIME-INR
INR: 2.57 — ABNORMAL HIGH (ref 0.00–1.49)
PROTHROMBIN TIME: 27.3 s — AB (ref 11.6–15.2)

## 2015-04-30 SURGERY — PACEMAKER IMPLANT
Anesthesia: LOCAL

## 2015-04-30 MED ORDER — WARFARIN SODIUM 3 MG PO TABS: 6.0000 mg | ORAL_TABLET | ORAL | Status: DC

## 2015-04-30 MED ORDER — FENTANYL CITRATE (PF) 100 MCG/2ML IJ SOLN
INTRAMUSCULAR | Status: AC
  Filled 2015-04-30: qty 2

## 2015-04-30 MED ORDER — CEFAZOLIN SODIUM-DEXTROSE 2-4 GM/100ML-% IV SOLN
2.0000 g | INTRAVENOUS | Status: AC
  Administered 2015-04-30: 2 g via INTRAVENOUS
  Filled 2015-04-30: qty 100

## 2015-04-30 MED ORDER — CHLORHEXIDINE GLUCONATE 4 % EX LIQD
CUTANEOUS | Status: AC
  Filled 2015-04-30: qty 15

## 2015-04-30 MED ORDER — CEFAZOLIN SODIUM 1-5 GM-% IV SOLN
1.0000 g | Freq: Four times a day (QID) | INTRAVENOUS | Status: AC
  Administered 2015-04-30 – 2015-05-01 (×3): 1 g via INTRAVENOUS
  Filled 2015-04-30 (×4): qty 50

## 2015-04-30 MED ORDER — LIDOCAINE HCL (PF) 1 % IJ SOLN
INTRAMUSCULAR | Status: AC
  Filled 2015-04-30: qty 60

## 2015-04-30 MED ORDER — VITAMIN D 1000 UNITS PO TABS
2000.0000 [IU] | ORAL_TABLET | Freq: Every day | ORAL | Status: DC
  Administered 2015-04-30 – 2015-05-02 (×3): 2000 [IU] via ORAL
  Filled 2015-04-30 (×3): qty 2

## 2015-04-30 MED ORDER — SULFAMETHOXAZOLE-TRIMETHOPRIM 400-80 MG PO TABS: 1.0000 | ORAL_TABLET | Freq: Two times a day (BID) | ORAL | Status: DC

## 2015-04-30 MED ORDER — HEPARIN (PORCINE) IN NACL 2-0.9 UNIT/ML-% IJ SOLN
INTRAMUSCULAR | Status: DC | PRN
  Administered 2015-04-30: 10:00:00

## 2015-04-30 MED ORDER — METFORMIN HCL 500 MG PO TABS
1000.0000 mg | ORAL_TABLET | Freq: Two times a day (BID) | ORAL | Status: DC
  Administered 2015-04-30 – 2015-05-02 (×4): 1000 mg via ORAL
  Filled 2015-04-30 (×4): qty 2

## 2015-04-30 MED ORDER — METOPROLOL TARTRATE 100 MG PO TABS
100.0000 mg | ORAL_TABLET | Freq: Two times a day (BID) | ORAL | Status: DC
  Administered 2015-04-30 – 2015-05-02 (×5): 100 mg via ORAL
  Filled 2015-04-30 (×3): qty 1
  Filled 2015-04-30: qty 2
  Filled 2015-04-30 (×2): qty 1

## 2015-04-30 MED ORDER — CEFAZOLIN SODIUM-DEXTROSE 2-3 GM-% IV SOLR
INTRAVENOUS | Status: AC
  Filled 2015-04-30: qty 50

## 2015-04-30 MED ORDER — FENTANYL CITRATE (PF) 100 MCG/2ML IJ SOLN
INTRAMUSCULAR | Status: DC | PRN
  Administered 2015-04-30 (×2): 12.5 ug via INTRAVENOUS
  Administered 2015-04-30: 25 ug via INTRAVENOUS

## 2015-04-30 MED ORDER — SODIUM CHLORIDE 0.9 % IV SOLN
INTRAVENOUS | Status: DC
  Administered 2015-04-30: 09:00:00 via INTRAVENOUS

## 2015-04-30 MED ORDER — FUROSEMIDE 40 MG PO TABS
40.0000 mg | ORAL_TABLET | Freq: Every day | ORAL | Status: DC
  Administered 2015-04-30 – 2015-05-02 (×3): 40 mg via ORAL
  Filled 2015-04-30 (×3): qty 1

## 2015-04-30 MED ORDER — AMIODARONE HCL 200 MG PO TABS
400.0000 mg | ORAL_TABLET | Freq: Two times a day (BID) | ORAL | Status: DC
  Administered 2015-04-30 – 2015-05-02 (×4): 400 mg via ORAL
  Filled 2015-04-30 (×5): qty 2

## 2015-04-30 MED ORDER — METOPROLOL TARTRATE 100 MG PO TABS: 100.0000 mg | ORAL_TABLET | Freq: Two times a day (BID) | ORAL | Status: DC

## 2015-04-30 MED ORDER — WARFARIN SODIUM 3 MG PO TABS
3.0000 mg | ORAL_TABLET | ORAL | Status: DC
  Administered 2015-04-30: 3 mg via ORAL
  Filled 2015-04-30: qty 1

## 2015-04-30 MED ORDER — WARFARIN SODIUM 3 MG PO TABS: 3.0000 mg | ORAL_TABLET | Freq: Every day | ORAL | Status: DC

## 2015-04-30 MED ORDER — GENTAMICIN SULFATE 40 MG/ML IJ SOLN
INTRAMUSCULAR | Status: AC
  Filled 2015-04-30: qty 2

## 2015-04-30 MED ORDER — ACETAMINOPHEN 325 MG PO TABS: 325.0000 mg | ORAL_TABLET | ORAL | Status: DC | PRN

## 2015-04-30 MED ORDER — ONDANSETRON HCL 4 MG/2ML IJ SOLN: 4.0000 mg | Freq: Four times a day (QID) | INTRAMUSCULAR | Status: DC | PRN

## 2015-04-30 MED ORDER — LIDOCAINE HCL (PF) 1 % IJ SOLN
INTRAMUSCULAR | Status: DC | PRN
  Administered 2015-04-30: 54 mL

## 2015-04-30 MED ORDER — MIDAZOLAM HCL 5 MG/5ML IJ SOLN
INTRAMUSCULAR | Status: AC
  Filled 2015-04-30: qty 5

## 2015-04-30 MED ORDER — MIDAZOLAM HCL 5 MG/5ML IJ SOLN
INTRAMUSCULAR | Status: DC | PRN
  Administered 2015-04-30: 2 mg via INTRAVENOUS

## 2015-04-30 MED ORDER — SODIUM CHLORIDE 0.9 % IR SOLN
80.0000 mg | Status: AC
  Administered 2015-04-30: 80 mg
  Filled 2015-04-30: qty 2

## 2015-04-30 SURGICAL SUPPLY — 8 items
CABLE SURGICAL S-101-97-12 (CABLE) ×1 IMPLANT
LEAD CAPSURE NOVUS 45CM (Lead) ×1 IMPLANT
LEAD CAPSURE NOVUS 5076-52CM (Lead) ×1 IMPLANT
PACEMAKER ADAPTA DR ADDRL1 (Pacemaker) IMPLANT
PAD DEFIB LIFELINK (PAD) ×1 IMPLANT
PPM ADAPTA DR ADDRL1 (Pacemaker) ×2 IMPLANT
SHEATH CLASSIC 7F (SHEATH) ×2 IMPLANT
TRAY PACEMAKER INSERTION (PACKS) ×2 IMPLANT

## 2015-04-30 NOTE — Progress Notes (Signed)
Insurance check completed for Pradaxa/Xarelto/Eliquis for coverage-S/W [facility] @ [place] # [phone]   1. PRADAXA 150 MG BID ( 30 )   COVER- YES   CO-PAY- $ 370.79 CO-INS   TIER- 4 DRUG   PRIOR APPROVAL - NO   PHARMACY : ANY RETAIL  * THE DEDUCTIBLE $ 400.00 MET- NO / PAID $ 315.00    2. XARELTO 20 MG DAILY ( 30 )   COVER- YES   CO-PAY- $ 47.00   TIER- 3 DRUG   PRIOR APPROVAL - NO   PHARMACY : ANY RETAIL   3 .ELIQUIS 5 MG BID ( 30 )   COVER- YES   CO-PAY- $47.00   TIER- 3 DRUG   PHARMACY : ANY RETAIL

## 2015-04-30 NOTE — Progress Notes (Signed)
Site area: Right Internal jugular a 7  French venous sheath was removed  Site Prior to Removal:  Level 0  Pressure Applied For 10 MINUTES    Bedrest Beginning at 1140a  Manual:   Yes.    Patient Status During Pull:  stable  Post Pull Groin Site:  Level 0  Post Pull Instructions Given:  Yes.    Post Pull Pulses Present:  Yes.    Dressing Applied:  Yes.    Comments:  VS remain stable during sheath pull.

## 2015-04-30 NOTE — H&P (Signed)
EP Attending  HPI: [name] is a 70 y.o. female With uncontrolled diabetes with CKD stage III, mixed hyperlipidemia, hypertension, moderate obesity and obstructive sleep apnea. She also has known mild 50-60% stenosis in the circumflex coronary artery by coronary angiography in September 2013, has chronic diastolic heart failure.  She had new onset asymptomatic A. Fibrillation with RVR on 03/08/2015. Patient did not take antioagulation as prescribed and after 2-3 office visits, started on Warfarin on 04/19/2014 along with dig and metoprolol for A. Fib with RVR.   Now admitted with palpitations and not feeling well. On tele found to have paroxysms of RVR and also 5.9 second pauses. Patient on Metroprolol and digoxin on hold, but was started on Amiodarone 200 mg BID and received one dose today. She was Rx Amiodarone, but did not start the medication.  She feels like she is going to pass out and symptoms are correlated with bradycardia with HR dropping to 25-29/min with 3 to 6 second pauses. This morning she went to church, felt heart racing, episodes of dizziness and near syncope, marked fatigue and shortness of breath. No chest pain. No leg edema or painful swelling of lower extremities.  Past Medical History  Diagnosis Date  . Diabetes mellitus without complication ([place])   . Hypertension   . Hyperlipemia   . CHF (congestive heart failure) ([place])   . Thyroid disease hypothyroid  . Obstructive sleep apnea   . Peripheral neuropathy ([place])   . Vitamin D deficiency   . Mitral valve disorders   . Renal disorder Kidney disease stage2     Past Surgical History  Procedure Laterality Date  . Left and right heart catheterization with coronary angiogram N/A 09/30/2011    Procedure: LEFT AND RIGHT HEART CATHETERIZATION WITH CORONARY ANGIOGRAM; Surgeon: Laverda Page, MD; Location: [facility] CATH LAB; Service: Cardiovascular; Laterality: N/A;  . Yag  laser application Right [id number]    Procedure: YAG LASER APPLICATION; Surgeon: Elta Guadeloupe T. Gershon Crane, MD; Location: [facility] ORS; Service: Ophthalmology; Laterality: Right; pt knows to arrive at 11:15  . Yag laser application Left [id number]    Procedure: YAG LASER APPLICATION; Surgeon: Rutherford Guys, MD; Location: [facility]S; Service: Ophthalmology; Laterality: Left;     Family History  Problem Relation Age of Onset  . Family history unknown: Yes     Social History: Social History   Social History  . Marital Status: Married    Spouse Name: N/A  . Number of Children: N/A  . Years of Education: N/A   Occupational History  . Not on file.   Social History Main Topics  . Smoking status: Never Smoker   . Smokeless tobacco: Never Used  . Alcohol Use: No  . Drug Use: Not on file  . Sexual Activity: Not on file   Other Topics Concern  . Not on file   Social History Narrative     Prescriptions prior to admission  Medication Sig Dispense Refill Last Dose  . cholecalciferol (VITAMIN D) 1000 UNITS tablet Take 2,000 Units by mouth daily.    04/29/2015 at Unknown time  . digoxin (LANOXIN) 0.25 MG tablet Take 250 mcg by mouth daily.   04/29/2015 at Unknown time  . fenofibrate 160 MG tablet Take 160 mg by mouth daily with supper.    04/28/2015 at Unknown time  . furosemide (LASIX) 40 MG tablet Take 40 mg by mouth daily.   04/29/2015 at Unknown time  . insulin NPH-regular Human (NOVOLIN 70/30) (70-30) 100 UNIT/ML injection Inject 35-75 Units  into the skin 2 (two) times daily with a meal. Inject 75 units subcutaneously with breakfast and 35 units with supper   04/29/2015 at am  . isosorbide mononitrate (IMDUR) 30 MG 24 hr tablet Take 1 tablet (30 mg total) by mouth daily. (Patient taking differently: Take 30 mg by mouth at bedtime. ) 30 tablet 0 04/28/2015 at Unknown time  . levothyroxine  (SYNTHROID, LEVOTHROID) 50 MCG tablet Take 50 mcg by mouth at bedtime.    04/28/2015 at Unknown time  . metFORMIN (GLUCOPHAGE) 500 MG tablet Take 1,000 mg by mouth 2 (two) times daily with a meal.   04/29/2015 at breakfast  . metoprolol (LOPRESSOR) 100 MG tablet Take 100 mg by mouth 2 (two) times daily.   04/29/2015 at 800  . Omega-3 Fatty Acids (FISH OIL) 1000 MG CAPS Take 1,000 mg by mouth 3 (three) times daily.   04/29/2015 at am  . pravastatin (PRAVACHOL) 40 MG tablet Take 40 mg by mouth at bedtime.    04/28/2015 at Unknown time  . valsartan-hydrochlorothiazide (DIOVAN-HCT) 320-12.5 MG per tablet Take 1 tablet by mouth daily.   04/29/2015 at Unknown time  . warfarin (COUMADIN) 3 MG tablet Take 3-6 mg by mouth daily after breakfast. Take 2 tablets (6 mg) by mouth on Tuesday and Thursday, take 1 tablet (3 mg) on Sunday, Monday, Wednesday, Friday and Saturday   04/29/2015 at Unknown time  . meclizine (ANTIVERT) 25 MG tablet Take 1 tablet (25 mg total) by mouth 3 (three) times daily as needed. (Patient not taking: Reported on 04/29/2015) 30 tablet 0 Not Taking at Unknown time  . metFORMIN (GLUCOPHAGE) 1000 MG tablet Take 1 tablet (1,000 mg total) by mouth 2 (two) times daily with a meal. (Patient not taking: Reported on 04/29/2015)   Not Taking at Unknown time  . sulfamethoxazole-trimethoprim (BACTRIM,SEPTRA) 400-80 MG tablet Take 1 tablet by mouth 2 (two) times daily.   not yet picked up     ROS: General: Fatigue present. No fevers/chills/night sweats Eyes: no blurry vision, diplopia, or amaurosis ENT: no sore throat or hearing loss Resp: Shortness of breath and dyspnea on exertion slightly worse than chronic for the past 2-3 days. No cough, wheezing, or hemoptysis CV: No chest pain GI: no abdominal pain, nausea, vomiting, diarrhea, or constipation GU: no dysuria, frequency, or hematuria Skin: no rash Neuro: no headache, numbness, tingling, or  weakness of extremities Musculoskeletal: no joint pain or swelling Heme: no bleeding, DVT, or easy bruising Endo: no polydipsia or polyuria   Physical Exam: Blood pressure 152/47, pulse 58 (paced), temperature 98.1 F (36.7 C), temperature source Oral, resp. rate 17, height 5\' 2"  (1.575 m), weight 90.2 kg (198 lb 13.7 oz), SpO2 98 %.   General appearance: alert, cooperative, appears stated age, no distress, moderately obese and Short stature Neck - right IJ temp wire in place Lungs: clear to auscultation bilaterally Heart: regular rate and rhythm, S1, S2 normal, no murmur, click, rub or gallop Abdomen: soft, non-tender; bowel sounds normal; no masses, no organomegaly and obese with mild pannus Extremities: extremities normal, atraumatic, no cyanosis or edema Pulses: 2+ and symmetric Neurologic: Grossly normal  Labs:  Lab Results  Component Value Date   WBC 9.8 04/29/2015   HGB 13.8 04/29/2015   HCT 41.7 04/29/2015   MCV 87.8 04/29/2015   PLT 169 04/29/2015    Recent Labs Lab 04/29/15 1521  NA 137  K 3.9  CL 94*  CO2 28  BUN 12  CREATININE 1.31*  CALCIUM  10.0  GLUCOSE 334*    Lipid Panel    Recent Labs (within last 365 days)     Recent Labs  04/29/15 1521  BNP 198.8*      HEMOGLOBIN A1C  Recent Labs    Lab Results  Component Value Date   HGBA1C 7.5* 06/01/2012   MPG 169* 06/01/2012      Recent Labs (within last 365 days)     Recent Labs  04/29/15 1904  TROPONINI 0.05*       Recent Labs    Lab Results  Component Value Date   CKTOTAL 509* 05/31/2012   CKMB 6.4* 05/31/2012   TROPONINI 0.05* 04/29/2015        Recent Labs (within last 365 days)     Recent Labs  04/29/15 1752 04/29/15 1903  TSH 1.145 1.050       Radiology:  Imaging Results (Last 48 hours)    Dg Chest 2 View  04/29/2015 CLINICAL DATA: Pt reports intermittent SOB,  tachycardia, and lightheadedness x 2-3 weeks; pt reports h/o DM, HTN, AFIB, and CHF; non-smoker EXAM: CHEST 2 VIEW COMPARISON: 05/30/2012 FINDINGS: Moderate thoracic spondylosis. Right hemidiaphragm eventration anteriorly. Midline trachea. Mild cardiomegaly. No pleural effusion or pneumothorax. Pulmonary interstitial thickening is decreased since the prior exam. No lobar consolidation. IMPRESSION: Cardiomegaly with pulmonary interstitial thickening, likely related to pulmonary venous congestion. Electronically Signed By: Abigail Miyamoto M.D. On: 04/29/2015 16:23     Scheduled Meds: . anidulafungin 200 mg Intravenous Once   Followed by  . [START ON 04/30/2015] anidulafungin 100 mg Intravenous Q24H  . antiseptic oral rinse 7 mL Mouth Rinse BID  . fenofibrate 160 mg Oral Q supper  . irbesartan 300 mg Oral Daily   And  . hydrochlorothiazide 12.5 mg Oral Daily  . [START ON 04/30/2015] insulin aspart 0-15 Units Subcutaneous TID WC  . insulin aspart 0-5 Units Subcutaneous QHS  . [START ON 04/30/2015] insulin aspart protamine- aspart 35 Units Subcutaneous BID WC  . isosorbide mononitrate 30 mg Oral Daily  . [START ON 04/30/2015] levothyroxine 50 mcg Oral QAC breakfast  . omega-3 acid ethyl esters 1 g Oral TID  . pravastatin 40 mg Oral QHS  . sodium chloride flush 3 mL Intravenous Q12H  . warfarin 3 mg Oral Once  . Warfarin - Pharmacist Dosing Inpatient  Does not apply q1800   Continuous Infusions: . sodium chloride 75 mL/hr at 04/29/15 1900   PRN Meds:.acetaminophen **OR** acetaminophen, HYDROmorphone (DILAUDID) injection, LORazepam, ondansetron **OR** ondansetron (ZOFRAN) IV  Echocardiogram 03/21/2015: Left ventricle cavity is normal in size. Moderate concentric hypertrophy of the left ventricle. Doppler evidence suggestive of grade III (restrictive) diastolic dysfunction, E/e' not done. There  is mild anterior and anteroseptal hypokinesis. Calculated EF 45%. Left atrial cavity is severely dilated. Moderate mitral regurgitation. Trace tricuspid regurgitation. Unable to estimate PA pressure due to absence/minimal TR signal. Insignificant pericardial effusion. Compared to the study done on 02/23/13, wall motion abnormality new and EF was previously normal. Abnormal Echo.   Lexiscan myoview stress test 02/20/2014: 1. The resting electrocardiogram demonstrated normal sinus rhythm, normal resting conduction, poor R-wave progression, cannot exclude inferior infarct old, anterior infarct old. Pulmonary disease pattern. No resting arrhythmias and nonspecific ST-T changes in the inferior and lateral leads, cannot exclude ischemia. Stress EKG is nondiagnostic for ischemia as it is a Pharmacologic stress test using Lexiscan infusion. Stress symptoms included dyspnea, dizziness. 2. Myocardial perfusion imaging is normal. Overall left ventricular systolic function was normal without regional wall motion abnormalities. The left  ventricular ejection fraction was 75%. No significant change from [place]: 07/12/10.  EKG 04/29/2015: Atrial fibrillation with rapid ventricular response. Left axis deviation, left plantar fascicular block. Poor R-wave progression, cannot exclude anterior infarct old. LVH. Repolarization of mallet the versus high lateral ischemia.  Telemetry: Episodes of atrial fibrillation with 5.9 second ventricle causes. Normal sinus rhythm with episodes of junctional escape rhythm. Normal sinus rhythm with episodes of 3-5 second pauses.  ASSESSMENT AND PLAN:  1. Sick sinus syndrome 2. Symptomatic AV block of 5.9 to 6 seconds. 3. New Paroxysmal A. Fib with RVR, presently in sinus rhythm. CHA2DS2-VASc Score is 5 with yearly risk of stroke of 6.7%. HAS-Bled score is 1 and estimated major bleeding in one year is 1.02-1.5%. Presently on Warfarin, INR 2.6. 4. CAD native vessel without  angina pectoris Cardiac catheterization on 09/30/2011: Circumflex coronary artery showed a 50-60% stenosis. There is mild 50% stenosis in the mid RCA. 5. Chronic diastolic heart failure, elevated troponin probably due to secondary troponin leak. 6. Uncontrolled type 2 diabetes mellitus without complication, with long-term current use of insulin 7. Hypertension 8. Hyperlipidemia mixed 9. Moderate obesity      Discussion: the patient has intermittent complete heart block and long pauses in addition to atrial fib with a RVR. I have discussed the risks/benefits/goals/expectations of DDD PM insertion and she wishes to proceed.  Mikle Bosworth.D.

## 2015-04-30 NOTE — Progress Notes (Signed)
[facility]                                                                              Patient Demographics  [name], is a 70 y.o. female, DOB - 05/05/1945, [id number]  Admit date - 04/29/2015   Admitting Physician Ripudeep Krystal Eaton, MD  Outpatient Primary MD for the patient is Thressa Sheller, MD  Outpatient specialists:   LOS - 1  days    Chief Complaint  Patient presents with  . Shortness of Breath       Brief HPI   Patient is a 70 year old female with history of atrial fibrillation, insulin-dependent diabetes mellitus, hypertension, hyperlipidemia, hypothyroidism, chronic kidney disease stage II, presented to ED with shortness of breath and palpitations since yesterday. History was obtained from the patient, her son in the room. Patient reported that she was placed on Coumadin and digoxin for atrial fibrillation 3 weeks ago by her cardiologist. Her heart rate was high and irregular at the time of follow-up. Since then she has been feeling poorly, tired and short of breath. Today patient went to the church with her husband and felt as if her heart was going to stop and then it was racing. She denied any chest pain however she did have mild shortness of breath with dizziness. Patient came home and rested however her symptoms did not improve. Hence she came to the ED.  In ED, patient was noted to have atrial fibrillation with RVR with heart rate in 140s and then subsequently extreme bradycardia, sinus in the 30s.   Assessment & Plan    Atrial fibrillation with RVR ([place]), tachy-brady syndrome - Cardiology was consulted, patient had temporary external pacemaker placed yesterday, EP consulted, permanent pacemaker to be placed today - Troponins +0.08, cardiology following  - TSH 1.0  - Hold digoxin, metoprolol - For now continue warfarin, patient prefers NOAC's however reported that her pharmacy quoted $400 for pradaxa. Case management consult for co-pay for  NOAC's.   Hypertension: Uncontrolled - Continue  imdur, valsartan, HCTZ   Diabetes mellitus (HCC) type 2, insulin-dependent with renal complications - Uncontrolled, hemoglobin A1c pending - Placed on sliding scale insulin, Insulin 70/30, increase to 40 units BID  Mild acute on chronic kidney disease stage II - Hold Lasix, digoxin, improved - Gentle hydration and avoid fluid overload  Hypothyroidism - TSH 1.0, continue Synthroid  Hyperlipidemia - Continue fenofibrate  Code Status: Full code  Family Communication: Discussed in detail with the patient, all imaging results, lab results explained to the patient, husband, son and daughter at the bedside   Disposition Plan: keep in SDU today  Time Spent in minutes  25 minutes  Procedures  pacemaker   Consults   cardiology  DVT Prophylaxis coumadin  Medications  Scheduled Meds: . [MAR Hold] antiseptic oral rinse  7 mL Mouth Rinse BID  . chlorhexidine      . [MAR Hold] fenofibrate  160 mg Oral Q supper  . gentamicin irrigation  80 mg Irrigation To Cath  . [MAR Hold] irbesartan  300 mg Oral Daily   And  . [MAR Hold] hydrochlorothiazide  12.5 mg Oral Daily  . [  MAR Hold] insulin aspart  0-15 Units Subcutaneous TID WC  . [MAR Hold] insulin aspart  0-5 Units Subcutaneous QHS  . [MAR Hold] insulin aspart protamine- aspart  35 Units Subcutaneous BID WC  . [MAR Hold] isosorbide mononitrate  30 mg Oral Daily  . [MAR Hold] levothyroxine  50 mcg Oral QAC breakfast  . [MAR Hold] metoprolol tartrate  100 mg Oral BID  . [MAR Hold] omega-3 acid ethyl esters  1 g Oral TID  . [MAR Hold] pravastatin  40 mg Oral QHS  . [MAR Hold] sodium chloride flush  3 mL Intravenous Q12H  . [MAR Hold] Warfarin - Pharmacist Dosing Inpatient   Does not apply q1800   Continuous Infusions: . sodium chloride Stopped (04/30/15 0834)  . sodium chloride 50 mL/hr at 04/30/15 0834  . sodium chloride 10 mL/hr at 04/30/15 0835   PRN Meds:.[MAR Hold]  acetaminophen **OR** [MAR Hold] acetaminophen, fentaNYL, cath procedure set-up drugs (heparinized saline/lidocaine/nitro), [MAR Hold]  HYDROmorphone (DILAUDID) injection, lidocaine (PF), [MAR Hold] LORazepam, midazolam, [MAR Hold] ondansetron **OR** [MAR Hold] ondansetron (ZOFRAN) IV   Antibiotics   Anti-infectives    Start     Dose/Rate Route Frequency Ordered Stop   04/30/15 2200  anidulafungin (ERAXIS) 100 mg in sodium chloride 0.9 % 100 mL IVPB  Status:  Discontinued     100 mg over 90 Minutes Intravenous Every 24 hours 04/29/15 2039 04/30/15 0900   04/30/15 0815  gentamicin (GARAMYCIN) 80 mg in sodium chloride irrigation 0.9 % 500 mL irrigation     80 mg Irrigation To Cath Lab 04/30/15 0802 05/01/15 0815   04/30/15 0815  ceFAZolin (ANCEF) IVPB 2g/100 mL premix     2 g 200 mL/hr over 30 Minutes Intravenous To Cath Lab 04/30/15 0802 04/30/15 1025   04/29/15 2200  anidulafungin (ERAXIS) 200 mg in sodium chloride 0.9 % 200 mL IVPB  Status:  Discontinued     200 mg over 180 Minutes Intravenous  Once 04/29/15 2039 04/30/15 0900        Subjective:   [name] was seen and examined today. Feeling a lot better today, after temporary pacemaker placed yesterday. Family members at the bedside. Patient denies dizziness, chest pain, shortness of breath, abdominal pain, N/V/D/C, new weakness, numbess, tingling.   Objective:   Filed Vitals:   04/30/15 0700 04/30/15 0800 04/30/15 0900 04/30/15 0949  BP: 136/66 115/50 127/55   Pulse: 59 59 59   Temp:  98 F (36.7 C)    TempSrc:  Oral    Resp: 22 18 20    Height:      Weight:      SpO2: 99% 98% 100% 95%    Intake/Output Summary (Last 24 hours) at 04/30/15 1033 Last data filed at 04/30/15 0900  Gross per 24 hour  Intake 1245.59 ml  Output   1500 ml  Net -254.41 ml     Wt Readings from Last 3 Encounters:  04/29/15 90.2 kg (198 lb 13.7 oz)  04/27/14 90.084 kg (198 lb 9.6 oz)  06/02/12 94.484 kg (208 lb 4.8 oz)      Exam  General: Alert and oriented x 3, NAD  HEENT:  PERRLA, EOMI, Anicteric Sclera, mucous membranes moist.   Neck: Supple, no JVD, no masses  CVS: S1 S2 auscultated, no rubs, murmurs or gallops. Regular rate and rhythm.  Respiratory: Clear to auscultation bilaterally, no wheezing, rales or rhonchi  Abdomen: Soft, nontender, nondistended, + bowel sounds  Ext: no cyanosis clubbing or edema  Neuro:  no new deficits  Skin: No rashes  Psych: Normal affect and demeanor, alert and oriented x3    Data Reviewed:  I have personally reviewed following labs and imaging studies  Micro Results Recent Results (from the past 240 hour(s))  MRSA PCR Screening     Status: None   Collection Time: 04/29/15  6:52 PM  Result Value Ref Range Status   MRSA by PCR NEGATIVE NEGATIVE Final    Comment:        The GeneXpert MRSA Assay (FDA approved for NASAL specimens only), is one component of a comprehensive MRSA colonization surveillance program. It is not intended to diagnose MRSA infection nor to guide or monitor treatment for MRSA infections.     Radiology Reports Dg Chest 2 View  04/29/2015  CLINICAL DATA:  Pt reports intermittent SOB, tachycardia, and lightheadedness x 2-3 weeks; pt reports h/o DM, HTN, AFIB, and CHF; non-smoker EXAM: CHEST  2 VIEW COMPARISON:  05/30/2012 FINDINGS: Moderate thoracic spondylosis. Right hemidiaphragm eventration anteriorly. Midline trachea. Mild cardiomegaly. No pleural effusion or pneumothorax. Pulmonary interstitial thickening is decreased since the prior exam. No lobar consolidation. IMPRESSION: Cardiomegaly with pulmonary interstitial thickening, likely related to pulmonary venous congestion. Electronically Signed   By: Abigail Miyamoto M.D.   On: 04/29/2015 16:23    CBC  Recent Labs Lab 04/29/15 1521 04/30/15 0249  WBC 9.8 9.8  HGB 13.8 13.0  HCT 41.7 40.5  PLT 169 145*  MCV 87.8 88.0  MCH 29.1 28.3  MCHC 33.1 32.1  RDW 14.1 14.2     Chemistries   Recent Labs Lab 04/29/15 1521 04/30/15 0249  NA 137 140  K 3.9 3.7  CL 94* 96*  CO2 28 33*  GLUCOSE 334* 202*  BUN 12 10  CREATININE 1.31* 1.02*  CALCIUM 10.0 9.4   ------------------------------------------------------------------------------------------------------------------ estimated creatinine clearance is 52 mL/min (by C-G formula based on Cr of 1.02). ------------------------------------------------------------------------------------------------------------------ No results for input(s): HGBA1C in the last 72 hours. ------------------------------------------------------------------------------------------------------------------ No results for input(s): CHOL, HDL, LDLCALC, TRIG, CHOLHDL, LDLDIRECT in the last 72 hours. ------------------------------------------------------------------------------------------------------------------  Recent Labs  04/29/15 1903  TSH 1.050   ------------------------------------------------------------------------------------------------------------------ No results for input(s): VITAMINB12, FOLATE, FERRITIN, TIBC, IRON, RETICCTPCT in the last 72 hours.  Coagulation profile  Recent Labs Lab 04/29/15 1521 04/30/15 0249  INR 2.66* 2.57*    No results for input(s): DDIMER in the last 72 hours.  Cardiac Enzymes  Recent Labs Lab 04/29/15 1904 04/30/15 0050 04/30/15 0650  TROPONINI 0.05* 0.08* 0.08*   ------------------------------------------------------------------------------------------------------------------ Invalid input(s): POCBNP   Recent Labs  04/29/15 1857 04/29/15 2309 04/30/15 0845  GLUCAP 264* 49* 247*     RAI,RIPUDEEP M.D. [facility] 04/30/2015, 10:33 AM  Pager: [phone] Between 7am to 7pm - call Pager - [phone]  After 7pm go to [email or web address] - password [email or web address]  Call night coverage person covering after 7pm

## 2015-04-30 NOTE — Progress Notes (Signed)
Subjective:  No further episodes of near syncope. Dyspnea improved.   Objective:  Vital Signs in the last 24 hours: Temp:  [98 F (36.7 C)-98.4 F (36.9 C)] 98 F (36.7 C) (04/17 0800) Pulse Rate:  [0-143] 59 (04/17 0900) Resp:  [0-42] 20 (04/17 0900) BP: (100-190)/(44-105) 127/55 mmHg (04/17 0900) SpO2:  [0 %-100 %] 100 % (04/17 0900) Weight:  [90.2 kg (198 lb 13.7 oz)-90.266 kg (199 lb)] 90.2 kg (198 lb 13.7 oz) (04/16 1847)  Intake/Output from previous day: 04/16 0701 - 04/17 0700 In: 1102.3 [P.O.:240; I.V.:862.3] Out: 1500 [Urine:1500]  Physical Exam:  General appearance: alert, cooperative, appears stated age, no distress, moderately obese and Short stature Lungs: clear to auscultation bilaterally Heart: regular rate and rhythm, S1, S2 normal, no murmur, click, rub or gallop Abdomen: soft, non-tender; bowel sounds normal; no masses, no organomegaly and obese with mild pannus Extremities: extremities normal, atraumatic, no cyanosis or edema Pulses: 2+ and symmetric Neurologic: Grossly normal Lab Results: BMP  Recent Labs  04/29/15 1521 04/30/15 0249  NA 137 140  K 3.9 3.7  CL 94* 96*  CO2 28 33*  GLUCOSE 334* 202*  BUN 12 10  CREATININE 1.31* 1.02*  CALCIUM 10.0 9.4  GFRNONAA 40* 54*  GFRAA 46* >60    CBC  Recent Labs Lab 04/30/15 0249  WBC 9.8  RBC 4.60  HGB 13.0  HCT 40.5  PLT 145*  MCV 88.0  MCH 28.3  MCHC 32.1  RDW 14.2    HEMOGLOBIN A1C Lab Results  Component Value Date   HGBA1C 7.5* 06/01/2012   MPG 169* 06/01/2012    Cardiac Panel (last 3 results)  Recent Labs  04/29/15 1904 04/30/15 0050 04/30/15 0650  TROPONINI 0.05* 0.08* 0.08*    Recent Labs  04/29/15 1752 04/29/15 1903  TSH 1.145 1.050    Cardiac Studies:  Echocardiogram 03/21/2015: Left ventricle cavity is normal in size. Moderate concentric hypertrophy of the left ventricle. Doppler evidence suggestive of grade III (restrictive) diastolic dysfunction, E/e'  not done. There is mild anterior and anteroseptal hypokinesis. Calculated EF 45%. Left atrial cavity is severely dilated. Moderate mitral regurgitation. Trace tricuspid regurgitation. Unable to estimate PA pressure due to absence/minimal TR signal. Insignificant pericardial effusion. Compared to the study done on 02/23/13, wall motion abnormality new and EF was previously normal. Abnormal Echo.   Lexiscan myoview stress test 02/20/2014: 1. The resting electrocardiogram demonstrated normal sinus rhythm, normal resting conduction, poor R-wave progression, cannot exclude inferior infarct old, anterior infarct old. Pulmonary disease pattern. No resting arrhythmias and nonspecific ST-T changes in the inferior and lateral leads, cannot exclude ischemia. Stress EKG is nondiagnostic for ischemia as it is a Pharmacologic stress test using Lexiscan infusion. Stress symptoms included dyspnea, dizziness. 2. Myocardial perfusion imaging is normal. Overall left ventricular systolic function was normal without regional wall motion abnormalities. The left ventricular ejection fraction was 75%. No significant change from [place]: 07/12/10.  EKG 04/29/2015: Atrial fibrillation with rapid ventricular response. Left axis deviation, left plantar fascicular block. Poor R-wave progression, cannot exclude anterior infarct old. LVH. Repolarization of mallet the versus high lateral ischemia.  Telemetry:  04/30/15: PAF, VVI paced. 04/29/15: Episodes of atrial fibrillation with 5.9 second ventricle causes. Normal sinus rhythm with episodes of junctional escape rhythm. Normal sinus rhythm with episodes of 3-5 second pauses.  Scheduled Meds: . [MAR Hold] antiseptic oral rinse  7 mL Mouth Rinse BID  .  ceFAZolin (ANCEF) IV  2 g Intravenous To Cath  . chlorhexidine      . [  MAR Hold] fenofibrate  160 mg Oral Q supper  . gentamicin irrigation  80 mg Irrigation To Cath  . [MAR Hold] irbesartan  300 mg Oral Daily   And  .  [MAR Hold] hydrochlorothiazide  12.5 mg Oral Daily  . [MAR Hold] insulin aspart  0-15 Units Subcutaneous TID WC  . [MAR Hold] insulin aspart  0-5 Units Subcutaneous QHS  . [MAR Hold] insulin aspart protamine- aspart  35 Units Subcutaneous BID WC  . [MAR Hold] isosorbide mononitrate  30 mg Oral Daily  . [MAR Hold] levothyroxine  50 mcg Oral QAC breakfast  . [MAR Hold] metoprolol tartrate  100 mg Oral BID  . [MAR Hold] omega-3 acid ethyl esters  1 g Oral TID  . [MAR Hold] pravastatin  40 mg Oral QHS  . [MAR Hold] sodium chloride flush  3 mL Intravenous Q12H  . [MAR Hold] Warfarin - Pharmacist Dosing Inpatient   Does not apply q1800   Continuous Infusions: . sodium chloride Stopped (04/30/15 0834)  . sodium chloride 50 mL/hr at 04/30/15 0834  . sodium chloride 10 mL/hr at 04/30/15 0835   PRN Meds:.[MAR Hold] acetaminophen **OR** [MAR Hold] acetaminophen, [MAR Hold]  HYDROmorphone (DILAUDID) injection, [MAR Hold] LORazepam, [MAR Hold] ondansetron **OR** [MAR Hold] ondansetron (ZOFRAN) IV   Assessment/Plan:  1. Sick sinus syndrome 2. PAF CHA2DS2-VASc Score is 5 with yearly risk of stroke of 6.7%. HAS-Bled score is 1 and estimated major bleeding in one year is 1.02-1.5%.  Rec: Right IJ site without hematoma. Needs PTVP. D/W Dr. Crissie Sickles.  No change in therapy for now. HR better with Metoprolol on Board and continue Amiodarone for now for rate control.   [name], M.D. 04/30/2015, 9:49 AM [facility] Pager: [phone] Office: [phone] If no answer: [phone]

## 2015-04-30 NOTE — Interval H&P Note (Signed)
History and Physical Interval Note:  04/30/2015 9:05 AM  [name]  has presented today for surgery, with the diagnosis of bradycardia  The various methods of treatment have been discussed with the patient and family. After consideration of risks, benefits and other options for treatment, the patient has consented to  Procedure(s): Pacemaker Implant (N/A) as a surgical intervention .  The patient's history has been reviewed, patient examined, no change in status, stable for surgery.  I have reviewed the patient's chart and labs.  Questions were answered to the patient's satisfaction.     Cristopher Peru

## 2015-04-30 NOTE — Progress Notes (Signed)
Inpatient Diabetes Program Recommendations  AACE/ADA: New Consensus Statement on Inpatient Glycemic Control (2015)  Target Ranges:  Prepandial:   less than 140 mg/dL      Peak postprandial:   less than 180 mg/dL (1-2 hours)      Critically ill patients:  140 - 180 mg/dL   Review of Glycemic Control Results for SOFIYA, ASHBRIDGE (MRN [id number]) as of 04/30/2015 09:32  Ref. Range 04/29/2015 18:57 04/29/2015 23:09 04/30/2015 08:45  Glucose-Capillary Latest Ref Range: 65-99 mg/dL 264 (H) 279 (H) 247 (H)   Diabetes history: DM Type 2 Outpatient Diabetes medications: Insulin 70/30 75 units ac breakfast & 35 units ac dinner Current orders for Inpatient glycemic control: 70/30 insulin 35 units bid (held this am due to procedure) + Novolog correction scale moderate 0-15  Inpatient Diabetes Program Recommendations:  Noted patient for procedure today. Will follow glycemic control on current orders.  Thank you, Nani Gasser. Cashtyn Pouliot, RN, MSN, CDE Inpatient Glycemic Control Team Team Pager [phone] (8am-5pm) 04/30/2015 9:37 AM

## 2015-04-30 NOTE — Progress Notes (Signed)
Orthopedic Tech Progress Note Patient Details:  [name] [birth year] [id number]  Ortho Devices Type of Ortho Device: Arm sling   [place] Pink 04/30/2015, 12:48 PM

## 2015-04-30 NOTE — Progress Notes (Signed)
Pt refused cpap tonight, stating that she tried our machine and couldn't tolerate it.  Pt prefers to wear [place] tonight, currently at 2l.  Pt was advised that RT is available all night should she change her mind.

## 2015-05-01 ENCOUNTER — Inpatient Hospital Stay (HOSPITAL_COMMUNITY): Payer: Commercial Managed Care - HMO

## 2015-05-01 ENCOUNTER — Other Ambulatory Visit (HOSPITAL_COMMUNITY): Payer: Commercial Managed Care - HMO

## 2015-05-01 DIAGNOSIS — I4891 Unspecified atrial fibrillation: Secondary | ICD-10-CM

## 2015-05-01 LAB — BASIC METABOLIC PANEL
Anion gap: 11 (ref 5–15)
BUN: 8 mg/dL (ref 6–20)
CHLORIDE: 94 mmol/L — AB (ref 101–111)
CO2: 32 mmol/L (ref 22–32)
CREATININE: 0.98 mg/dL (ref 0.44–1.00)
Calcium: 9.5 mg/dL (ref 8.9–10.3)
GFR calc Af Amer: >60 mL/min (ref >60)
GFR calc non Af Amer: 56 mL/min — ABNORMAL LOW (ref >60)
GLUCOSE: 286 mg/dL — AB (ref 65–99)
POTASSIUM: 3.7 mmol/L (ref 3.5–5.1)
SODIUM: 137 mmol/L (ref 135–145)

## 2015-05-01 LAB — CBC
HEMATOCRIT: 38.4 % (ref 36.0–46.0)
Hemoglobin: 12.2 g/dL (ref 12.0–15.0)
MCH: 28.4 pg (ref 26.0–34.0)
MCHC: 31.8 g/dL (ref 30.0–36.0)
MCV: 89.3 fL (ref 78.0–100.0)
PLATELETS: 121 10*3/uL — AB (ref 150–400)
RBC: 4.3 MIL/uL (ref 3.87–5.11)
RDW: 14.4 % (ref 11.5–15.5)
WBC: 9.5 10*3/uL (ref 4.0–10.5)

## 2015-05-01 LAB — GLUCOSE, CAPILLARY
GLUCOSE-CAPILLARY: 226 mg/dL — AB (ref 65–99)
Glucose-Capillary: 240 mg/dL — ABNORMAL HIGH (ref 65–99)
Glucose-Capillary: 213 mg/dL — ABNORMAL HIGH (ref 65–99)
Glucose-Capillary: 232 mg/dL — ABNORMAL HIGH (ref 65–99)
Glucose-Capillary: 195 mg/dL — ABNORMAL HIGH (ref 65–99)

## 2015-05-01 LAB — PROTIME-INR
INR: 2.22 — AB (ref 0.00–1.49)
PROTHROMBIN TIME: 24.4 s — AB (ref 11.6–15.2)

## 2015-05-01 LAB — HEMOGLOBIN A1C
Hgb A1c MFr Bld: 8.3 % — ABNORMAL HIGH (ref 4.8–5.6)
Mean Plasma Glucose: 192 mg/dL

## 2015-05-01 MED ORDER — APIXABAN 5 MG PO TABS
5.0000 mg | ORAL_TABLET | Freq: Two times a day (BID) | ORAL | Status: DC
  Administered 2015-05-02: 5 mg via ORAL
  Filled 2015-05-01: qty 1

## 2015-05-01 MED ORDER — INSULIN ASPART PROT & ASPART (70-30 MIX) 100 UNIT/ML ~~LOC~~ SUSP
40.0000 [IU] | Freq: Two times a day (BID) | SUBCUTANEOUS | Status: DC
  Administered 2015-05-01 – 2015-05-02 (×2): 40 [IU] via SUBCUTANEOUS
  Filled 2015-05-01: qty 10

## 2015-05-01 MED ORDER — APIXABAN 5 MG PO TABS: 5.0000 mg | ORAL_TABLET | Freq: Two times a day (BID) | ORAL | Status: DC

## 2015-05-01 MED ORDER — AMIODARONE HCL 400 MG PO TABS: 400.0000 mg | ORAL_TABLET | Freq: Two times a day (BID) | ORAL | Status: DC

## 2015-05-01 MED ORDER — AMIODARONE HCL 200 MG PO TABS: 200.0000 mg | ORAL_TABLET | Freq: Two times a day (BID) | ORAL | Status: DC

## 2015-05-01 MED FILL — Heparin Sodium (Porcine) 2 Unit/ML in Sodium Chloride 0.9%: INTRAMUSCULAR | Qty: 1000 | Status: AC

## 2015-05-01 MED FILL — Verapamil HCl IV Soln 2.5 MG/ML: INTRAVENOUS | Qty: 2 | Status: AC

## 2015-05-01 MED FILL — Nitroglycerin IV Soln 100 MCG/ML in D5W: INTRA_ARTERIAL | Qty: 10 | Status: AC

## 2015-05-01 NOTE — Discharge Instructions (Signed)
Supplemental Discharge Instructions for  Pacemaker/Defibrillator Patients  Activity No heavy lifting or vigorous activity with your left/right arm for 6 to 8 weeks.  Do not raise your left/right arm above your head for one week.  Gradually raise your affected arm as drawn below.           __       05/05/15                   05/06/15                         05/07/15                  05/08/15   WOUND CARE - Keep the wound area clean and dry.  Do not get this area wet for one week. No showers for one week; you may shower on    05/08/15 . - The tape/steri-strips on your wound will fall off; do not pull them off.  No bandage is needed on the site.  DO  NOT apply any creams, oils, or ointments to the wound area. - If you notice any drainage or discharge from the wound, any swelling or bruising at the site, or you develop a fever > 101? F after you are discharged home, call the office at once.  Special Instructions - You are still able to use cellular telephones; use the ear opposite the side where you have your pacemaker/defibrillator.  Avoid carrying your cellular phone near your device. - When traveling through airports, show security personnel your identification card to avoid being screened in the metal detectors.  Ask the security personnel to use the hand wand. - Avoid arc welding equipment, MRI testing (magnetic resonance imaging), TENS units (transcutaneous nerve stimulators).  Call the office for questions about other devices. - Avoid electrical appliances that are in poor condition or are not properly grounded. - Microwave ovens are safe to be near or to operate.    Information on my medicine - ELIQUIS (apixaban)   Why was Eliquis prescribed for you? Eliquis was prescribed for you to reduce the risk of a blood clot forming that can cause a stroke if you have a medical condition called atrial fibrillation (a type of irregular heartbeat).  What do You need to know about Eliquis  ? Take your Eliquis TWICE DAILY - one tablet in the morning and one tablet in the evening with or without food. If you have difficulty swallowing the tablet whole please discuss with your pharmacist how to take the medication safely.  Take Eliquis exactly as prescribed by your doctor and DO NOT stop taking Eliquis without talking to the doctor who prescribed the medication.  Stopping may increase your risk of developing a stroke.  Refill your prescription before you run out.  After discharge, you should have regular check-up appointments with your healthcare provider that is prescribing your Eliquis.  In the future your dose may need to be changed if your kidney function or weight changes by a significant amount or as you get older.  What do you do if you miss a dose? If you miss a dose, take it as soon as you remember on the same day and resume taking twice daily.  Do not take more than one dose of ELIQUIS at the same time to make up a missed dose.  Important Safety Information A possible side effect of Eliquis is bleeding. You should  call your healthcare provider right away if you experience any of the following: ? Bleeding from an injury or your nose that does not stop. ? Unusual colored urine (red or dark brown) or unusual colored stools (red or black). ? Unusual bruising for unknown reasons. ? A serious fall or if you hit your head (even if there is no bleeding).  Some medicines may interact with Eliquis and might increase your risk of bleeding or clotting while on Eliquis. To help avoid this, consult your healthcare provider or pharmacist prior to using any new prescription or non-prescription medications, including herbals, vitamins, non-steroidal anti-inflammatory drugs (NSAIDs) and supplements.  This website has more information on Eliquis (apixaban): http://www.eliquis.com/eliquis/home

## 2015-05-01 NOTE — Consult Note (Signed)
   [facility] CM Inpatient Consult   05/01/2015  DEVANNY PALECEK [birth year] [id number]   Referral received to assess for care management services.    Met with the patient regarding the benefits of [place] [facility] services with [facility]. She endorses that Dr. Trilby Drummer as the primary care provider.  Explained that [place] [facility] is a covered benefit of insurance. Review information for [facility] Care Management and a brochure was provided with contact information.  Explained that [facility] does not interfere with or replace any services arranged by the inpatient care management staff.  Patient declined services with [place] [facility] at this time.   Encouraged the patient to call the information on the brochure if she feels her needs have changed. For questions or referrals please contact:  Natividad Brood, RN BSN [facility] Liaison  [phone] business mobile phone Toll free office [phone]

## 2015-05-01 NOTE — Progress Notes (Signed)
[facility]                                                                              Patient Demographics  [name], is a 70 y.o. female, DOB - October 19, 1945, [id number]  Admit date - 04/29/2015   Admitting Physician Ripudeep Krystal Eaton, MD  Outpatient Primary MD for the patient is Thressa Sheller, MD  Outpatient specialists:   LOS - 2  days    Chief Complaint  Patient presents with  . Shortness of Breath       Brief HPI   Patient is a 70 year old female with history of atrial fibrillation, insulin-dependent diabetes mellitus, hypertension, hyperlipidemia, hypothyroidism, chronic kidney disease stage II, presented to ED with shortness of breath and palpitations since yesterday. History was obtained from the patient, her son in the room. Patient reported that she was placed on Coumadin and digoxin for atrial fibrillation 3 weeks ago by her cardiologist. Her heart rate was high and irregular at the time of follow-up. Since then she has been feeling poorly, tired and short of breath. Today patient went to the church with her husband and felt as if her heart was going to stop and then it was racing. She denied any chest pain however she did have mild shortness of breath with dizziness. Patient came home and rested however her symptoms did not improve. Hence she came to the ED.  In ED, patient was noted to have atrial fibrillation with RVR with heart rate in 140s and then subsequently extreme bradycardia, sinus in the 30s.   Assessment & Plan    Atrial fibrillation with RVR ([place]), tachy-brady syndrome - Cardiology was consulted, patient had temporary external pacemaker placed On 4/16, permanent pacemaker placed on 4/17 - Troponins +0.08, cardiology following  - TSH 1.0  - Hold digoxin. Metoprolol was restarted by cardiology - Patient also placed on amiodarone. Stable from EP standpoint. Discussed with cardiology, Dr. Einar Gip recommended to observe overnight, DC in  a.m. if stable - Patient prefers NOACs, started on eliquis from tomorrow, INR 2.22  Hypertension: Uncontrolled - Continue  imdur, valsartan, HCTZ   Diabetes mellitus ([place]) type 2, insulin-dependent with renal complications - Uncontrolled, hemoglobin A1c 8.3 - Placed on sliding scale insulin, Insulin 70/30, increase to 40 units BID  Mild acute on chronic kidney disease stage II - Hold Lasix, digoxin, improved  Hypothyroidism - TSH 1.0, continue Synthroid  Hyperlipidemia - Continue fenofibrate  Code Status: Full code  Family Communication: Discussed in detail with the patient, all imaging results, lab results explained to the patient    Disposition Plan: Hopefully DC home in a.m. Time Spent in minutes  25 minutes  Procedures  pacemaker   Consults   cardiology  DVT Prophylaxis coumadin  Medications  Scheduled Meds: . amiodarone  400 mg Oral BID  . antiseptic oral rinse  7 mL Mouth Rinse BID  . [START ON 05/02/2015] apixaban  5 mg Oral BID  . cholecalciferol  2,000 Units Oral Daily  . fenofibrate  160 mg Oral Q supper  . furosemide  40 mg Oral Daily  . irbesartan  300 mg Oral Daily  And  . hydrochlorothiazide  12.5 mg Oral Daily  . insulin aspart  0-15 Units Subcutaneous TID WC  . insulin aspart  0-5 Units Subcutaneous QHS  . insulin aspart protamine- aspart  35 Units Subcutaneous BID WC  . isosorbide mononitrate  30 mg Oral Daily  . levothyroxine  50 mcg Oral QAC breakfast  . metFORMIN  1,000 mg Oral BID WC  . metoprolol tartrate  100 mg Oral BID  . omega-3 acid ethyl esters  1 g Oral TID  . pravastatin  40 mg Oral QHS  . sodium chloride flush  3 mL Intravenous Q12H   Continuous Infusions: . sodium chloride Stopped (04/30/15 0834)  . sodium chloride 10 mL/hr at 04/30/15 0835   PRN Meds:.acetaminophen, HYDROmorphone (DILAUDID) injection, LORazepam, ondansetron (ZOFRAN) IV, ondansetron **OR** [DISCONTINUED] ondansetron (ZOFRAN) IV   Antibiotics    Anti-infectives    Start     Dose/Rate Route Frequency Ordered Stop   04/30/15 2200  anidulafungin (ERAXIS) 100 mg in sodium chloride 0.9 % 100 mL IVPB  Status:  Discontinued     100 mg over 90 Minutes Intravenous Every 24 hours 04/29/15 2039 04/30/15 0900   04/30/15 1600  ceFAZolin (ANCEF) IVPB 1 g/50 mL premix     1 g 100 mL/hr over 30 Minutes Intravenous Every 6 hours 04/30/15 1159 05/01/15 0423   04/30/15 1200  sulfamethoxazole-trimethoprim (BACTRIM,SEPTRA) 400-80 MG per tablet 1 tablet  Status:  Discontinued     1 tablet Oral 2 times daily 04/30/15 1159 04/30/15 1209   04/30/15 0815  gentamicin (GARAMYCIN) 80 mg in sodium chloride irrigation 0.9 % 500 mL irrigation     80 mg Irrigation To Cath Lab 04/30/15 0802 04/30/15 1040   04/30/15 0815  ceFAZolin (ANCEF) IVPB 2g/100 mL premix     2 g 200 mL/hr over 30 Minutes Intravenous To Cath Lab 04/30/15 0802 04/30/15 1025   04/29/15 2200  anidulafungin (ERAXIS) 200 mg in sodium chloride 0.9 % 200 mL IVPB  Status:  Discontinued     200 mg over 180 Minutes Intravenous  Once 04/29/15 2039 04/30/15 0900        Subjective:   [name] was seen and examined today.Feeling better, ambulating in the room.  Patient denies dizziness, chest pain, shortness of breath, abdominal pain, N/V/D/C, new weakness, numbess, tingling.   Objective:   Filed Vitals:   04/30/15 1217 04/30/15 2104 05/01/15 0405 05/01/15 1029  BP: 139/50 168/39 145/53 126/61  Pulse: 60 60 60 126  Temp: 98.6 F (37 C) 99.5 F (37.5 C) 98.3 F (36.8 C)   TempSrc: Oral Oral Oral   Resp: 18 18 18    Height:      Weight:      SpO2: 99% 96% 97% 99%    Intake/Output Summary (Last 24 hours) at 05/01/15 1110 Last data filed at 05/01/15 0840  Gross per 24 hour  Intake    480 ml  Output   1750 ml  Net  -1270 ml     Wt Readings from Last 3 Encounters:  04/29/15 90.2 kg (198 lb 13.7 oz)  04/27/14 90.084 kg (198 lb 9.6 oz)  06/02/12 94.484 kg (208 lb 4.8 oz)      Exam  General: Alert and oriented x 3, NAD  HEENT:    Neck:  CVS: S1 S2 auscultated, no rubs, murmurs or gallops. Regular rate and rhythm.  Respiratory: Clear to auscultation bilaterally, no wheezing, rales or rhonchi  Abdomen: Soft, nontender, nondistended, + bowel sounds  Ext: no cyanosis clubbing or edema  Neuro: no new deficits  Skin: No rashes  Psych: Normal affect and demeanor, alert and oriented x3    Data Reviewed:  I have personally reviewed following labs and imaging studies  Micro Results Recent Results (from the past 240 hour(s))  MRSA PCR Screening     Status: None   Collection Time: 04/29/15  6:52 PM  Result Value Ref Range Status   MRSA by PCR NEGATIVE NEGATIVE Final    Comment:        The GeneXpert MRSA Assay (FDA approved for NASAL specimens only), is one component of a comprehensive MRSA colonization surveillance program. It is not intended to diagnose MRSA infection nor to guide or monitor treatment for MRSA infections.   Urine culture     Status: Abnormal (Preliminary result)   Collection Time: 04/29/15  7:30 PM  Result Value Ref Range Status   Specimen Description URINE, CLEAN CATCH  Final   Special Requests NONE  Final   Culture (A)  Final    >=100,000 COLONIES/mL GRAM NEGATIVE RODS CULTURE REINCUBATED FOR BETTER GROWTH    Report Status PENDING  Incomplete    Radiology Reports Dg Chest 2 View  05/01/2015  CLINICAL DATA:  Pacemaker placement EXAM: CHEST  2 VIEW COMPARISON:  04/29/2015 FINDINGS: Left subclavian dual lead pacemaker has been placed. Leads in the right atrium and right ventricle in good position. No pneumothorax Mild cardiac enlargement without heart failure. Eventration right hemidiaphragm. Negative for infiltrate or effusion IMPRESSION: Satisfactory pacemaker placement without complication. Electronically Signed   By: Franchot Gallo M.D.   On: 05/01/2015 07:59   Dg Chest 2 View  04/29/2015  CLINICAL DATA:  Pt  reports intermittent SOB, tachycardia, and lightheadedness x 2-3 weeks; pt reports h/o DM, HTN, AFIB, and CHF; non-smoker EXAM: CHEST  2 VIEW COMPARISON:  05/30/2012 FINDINGS: Moderate thoracic spondylosis. Right hemidiaphragm eventration anteriorly. Midline trachea. Mild cardiomegaly. No pleural effusion or pneumothorax. Pulmonary interstitial thickening is decreased since the prior exam. No lobar consolidation. IMPRESSION: Cardiomegaly with pulmonary interstitial thickening, likely related to pulmonary venous congestion. Electronically Signed   By: Abigail Miyamoto M.D.   On: 04/29/2015 16:23    CBC  Recent Labs Lab 04/29/15 1521 04/30/15 0249 05/01/15 1000  WBC 9.8 9.8 9.5  HGB 13.8 13.0 12.2  HCT 41.7 40.5 38.4  PLT 169 145* PENDING  MCV 87.8 88.0 89.3  MCH 29.1 28.3 28.4  MCHC 33.1 32.1 31.8  RDW 14.1 14.2 14.4    Chemistries   Recent Labs Lab 04/29/15 1521 04/30/15 0249  NA 137 140  K 3.9 3.7  CL 94* 96*  CO2 28 33*  GLUCOSE 334* 202*  BUN 12 10  CREATININE 1.31* 1.02*  CALCIUM 10.0 9.4   ------------------------------------------------------------------------------------------------------------------ estimated creatinine clearance is 52 mL/min (by C-G formula based on Cr of 1.02). ------------------------------------------------------------------------------------------------------------------  Recent Labs  04/29/15 1904  HGBA1C 8.3*   ------------------------------------------------------------------------------------------------------------------ No results for input(s): CHOL, HDL, LDLCALC, TRIG, CHOLHDL, LDLDIRECT in the last 72 hours. ------------------------------------------------------------------------------------------------------------------  Recent Labs  04/29/15 1903  TSH 1.050   ------------------------------------------------------------------------------------------------------------------ No results for input(s): VITAMINB12, FOLATE, FERRITIN,  TIBC, IRON, RETICCTPCT in the last 72 hours.  Coagulation profile  Recent Labs Lab 04/29/15 1521 04/30/15 0249 05/01/15 0315  INR 2.66* 2.57* 2.22*    No results for input(s): DDIMER in the last 72 hours.  Cardiac Enzymes  Recent Labs Lab 04/29/15 1904 04/30/15 0050 04/30/15 0650  TROPONINI 0.05* 0.08* 0.08*   ------------------------------------------------------------------------------------------------------------------  Invalid input(s): [place]  04/30/15 0845 04/30/15 1129 04/30/15 1653 04/30/15 2136 05/01/15 0540 05/01/15 0649  GLUCAP 247* 232* 235* 305* 240* 71*     RAI,RIPUDEEP M.D. [facility] 05/01/2015, 11:10 AM  Pager: [phone] Between 7am to 7pm - call Pager - [phone]  After 7pm go to [email or web address] - password [email or web address]  Call night coverage person covering after 7pm

## 2015-05-01 NOTE — Progress Notes (Signed)
  Echocardiogram 2D Echocardiogram has been performed.  [name] 05/01/2015, 6:00 PM

## 2015-05-01 NOTE — Clinical Documentation Improvement (Signed)
Hospitalist  Please document query response in the progress notes and discharge summary, not on the CDI BPA from.  Thank you.  Possible Clinical Conditions associated with below indicators:  UTI, including any treatment ordered.  Other Condition  Cannot Clinically Determine   Supporting Information: 04/29/15 - Urine Culture - 100,000 gram negative rods   Please exercise your independent, professional judgment when responding. A specific answer is not anticipated or expected.   Thank You,  Erling Conte  RN BSN CCDS [phone] Health Information Management [place]

## 2015-05-01 NOTE — Progress Notes (Signed)
SUBJECTIVE: The patient is doing well today.  At this time, she denies chest pain, shortness of breath, or any new concerns.  CURRENT MEDICATIONS: . amiodarone  400 mg Oral BID  . antiseptic oral rinse  7 mL Mouth Rinse BID  . cholecalciferol  2,000 Units Oral Daily  . fenofibrate  160 mg Oral Q supper  . furosemide  40 mg Oral Daily  . irbesartan  300 mg Oral Daily   And  . hydrochlorothiazide  12.5 mg Oral Daily  . insulin aspart  0-15 Units Subcutaneous TID WC  . insulin aspart  0-5 Units Subcutaneous QHS  . insulin aspart protamine- aspart  35 Units Subcutaneous BID WC  . isosorbide mononitrate  30 mg Oral Daily  . levothyroxine  50 mcg Oral QAC breakfast  . metFORMIN  1,000 mg Oral BID WC  . metoprolol tartrate  100 mg Oral BID  . omega-3 acid ethyl esters  1 g Oral TID  . pravastatin  40 mg Oral QHS  . sodium chloride flush  3 mL Intravenous Q12H  . warfarin  3 mg Oral Once per day on Sun Mon Wed Fri Sat  . warfarin  6 mg Oral Once per day on Tue Thu   . sodium chloride Stopped (04/30/15 0834)  . sodium chloride 10 mL/hr at 04/30/15 0835    OBJECTIVE: Physical Exam: Filed Vitals:   04/30/15 1140 04/30/15 1217 04/30/15 2104 05/01/15 0405  BP:  139/50 168/39 145/53  Pulse: 59 60 60 60  Temp:  98.6 F (37 C) 99.5 F (37.5 C) 98.3 F (36.8 C)  TempSrc:  Oral Oral Oral  Resp: 19 18 18 18   Height:      Weight:      SpO2: 98% 99% 96% 97%    Intake/Output Summary (Last 24 hours) at 05/01/15 0900 Last data filed at 05/01/15 0840  Gross per 24 hour  Intake    640 ml  Output   1750 ml  Net  -1110 ml    Telemetry reveals atrial fibrillation with RVR, atrial pacing with intrinsic ventricular conduction  GEN- The patient is elderly appearing, alert and oriented x 3 today.   Head- normocephalic, atraumatic Eyes-  Sclera clear, conjunctiva pink Ears- hearing intact Oropharynx- clear Neck- supple  Lungs- Clear to ausculation bilaterally, normal work of  breathing Heart- Tachycardic irregular rate and rhythm  GI- soft, NT, ND, + BS Extremities- no clubbing, cyanosis, or edema Skin- no rash or lesion Psych- euthymic mood, full affect Neuro- strength and sensation are intact  LABS: Basic Metabolic Panel:  Recent Labs  04/29/15 1521 04/30/15 0249  NA 137 140  K 3.9 3.7  CL 94* 96*  CO2 28 33*  GLUCOSE 334* 202*  BUN 12 10  CREATININE 1.31* 1.02*  CALCIUM 10.0 9.4   CBC:  Recent Labs  04/29/15 1521 04/30/15 0249  WBC 9.8 9.8  HGB 13.8 13.0  HCT 41.7 40.5  MCV 87.8 88.0  PLT 169 145*   Cardiac Enzymes:  Recent Labs  04/29/15 1904 04/30/15 0050 04/30/15 0650  TROPONINI 0.05* 0.08* 0.08*   Hemoglobin A1C:  Recent Labs  04/29/15 1904  HGBA1C 8.3*   Thyroid Function Tests:  Recent Labs  04/29/15 1903  TSH 1.050    RADIOLOGY: Dg Chest 2 View 05/01/2015  CLINICAL DATA:  Pacemaker placement EXAM: CHEST  2 VIEW COMPARISON:  04/29/2015 FINDINGS: Left subclavian dual lead pacemaker has been placed. Leads in the right atrium and right ventricle  in good position. No pneumothorax Mild cardiac enlargement without heart failure. Eventration right hemidiaphragm. Negative for infiltrate or effusion IMPRESSION: Satisfactory pacemaker placement without complication. Electronically Signed   By: Franchot Gallo M.D.   On: 05/01/2015 07:59    ASSESSMENT AND PLAN:  Principal Problem:   Atrial fibrillation with RVR (HCC) Active Problems:   Diabetes mellitus (HCC)   CAD (coronary artery disease)   Moderate mitral regurgitation   Tachycardia-bradycardia syndrome ([place])   1.  Tachy/brady syndrome S/p PPM implant 05/10/15 CXR without ptx Device interrogated and functioning normally Left chest without hematoma/ecchymosis  2.  Paroxysmal atrial fibrillation Currently being loaded with amiodarone Can increase rate control as needed now with backup pacing support Continue Warfarin for CHADS2VASC of 5  Ok from EP standpoint  to discharge home.  Will arrange wound check appointment in our office and anticipate Dr Einar Gip follow pacemaker after that.    Chanetta Marshall, NP 05/01/2015 9:00 AM  EP attending  Patient seen and examined. Agree with the findings as noted above. She is stable for discharge from my perspective with usual followup. Longterm, she will followup with Dr. Einar Gip.  Mikle Bosworth.D.

## 2015-05-01 NOTE — Progress Notes (Signed)
Pt refused cpap tonight.  Machine not in room.  Pt was encouraged to call should she change her mind.

## 2015-05-01 NOTE — Progress Notes (Signed)
Subjective:  Doing well. No dizziness and dyspnea improved. Had PTVP implant yesterday.    Objective:  Vital Signs in the last 24 hours: Temp:  [98.3 F (36.8 C)-99.5 F (37.5 C)] 98.3 F (36.8 C) (04/18 0405) Pulse Rate:  [59-126] 126 (04/18 1029) Resp:  [18-19] 18 (04/18 0405) BP: (126-168)/(39-61) 126/61 mmHg (04/18 1029) SpO2:  [96 %-99 %] 99 % (04/18 1029)  Intake/Output from previous day: 04/17 0701 - 04/18 0700 In: 543.3 [P.O.:240; I.V.:203.3; IV Piggyback:100] Out: 1750 [Urine:1750]  Physical Exam:  General appearance: alert, cooperative, appears stated age, no distress, moderately obese and Short stature Lungs: clear to auscultation bilaterally. Left pacemaker pocket is stable without hematoma.  Heart: Irregular rhythm, S1 variable , S2 normal, no murmur, click, rub or gallop Abdomen: soft, non-tender; bowel sounds normal; no masses, no organomegaly and obese with mild pannus Extremities: extremities normal, atraumatic, no cyanosis or edema Pulses: 2+ and symmetric Neurologic: Grossly normal Lab Results: BMP  Recent Labs  04/29/15 1521 04/30/15 0249 05/01/15 1000  NA 137 140 137  K 3.9 3.7 3.7  CL 94* 96* 94*  CO2 28 33* 32  GLUCOSE 334* 202* 286*  BUN 12 10 8   CREATININE 1.31* 1.02* 0.98  CALCIUM 10.0 9.4 9.5  GFRNONAA 40* 54* 56*  GFRAA 46* >60 >60    CBC  Recent Labs Lab 05/01/15 1000  WBC 9.5  RBC 4.30  HGB 12.2  HCT 38.4  PLT 121*  MCV 89.3  MCH 28.4  MCHC 31.8  RDW 14.4    HEMOGLOBIN A1C Lab Results  Component Value Date   HGBA1C 8.3* 04/29/2015   MPG 192 04/29/2015    Cardiac Panel (last 3 results)  Recent Labs  04/29/15 1904 04/30/15 0050 04/30/15 0650  TROPONINI 0.05* 0.08* 0.08*    Recent Labs  04/29/15 1752 04/29/15 1903  TSH 1.145 1.050    Cardiac Studies:  Echocardiogram 03/21/2015: Left ventricle cavity is normal in size. Moderate concentric hypertrophy of the left ventricle. Doppler evidence suggestive  of grade III (restrictive) diastolic dysfunction, E/e' not done. There is mild anterior and anteroseptal hypokinesis. Calculated EF 45%. Left atrial cavity is severely dilated. Moderate mitral regurgitation. Trace tricuspid regurgitation. Unable to estimate PA pressure due to absence/minimal TR signal. Insignificant pericardial effusion. Compared to the study done on 02/23/13, wall motion abnormality new and EF was previously normal. Abnormal Echo.   Lexiscan myoview stress test 02/20/2014: 1. The resting electrocardiogram demonstrated normal sinus rhythm, normal resting conduction, poor R-wave progression, cannot exclude inferior infarct old, anterior infarct old. Pulmonary disease pattern. No resting arrhythmias and nonspecific ST-T changes in the inferior and lateral leads, cannot exclude ischemia. Stress EKG is nondiagnostic for ischemia as it is a Pharmacologic stress test using Lexiscan infusion. Stress symptoms included dyspnea, dizziness. 2. Myocardial perfusion imaging is normal. Overall left ventricular systolic function was normal without regional wall motion abnormalities. The left ventricular ejection fraction was 75%. No significant change from [place]: 07/12/10.  EKG 04/29/2015: Atrial fibrillation with rapid ventricular response. Left axis deviation, left plantar fascicular block. Poor R-wave progression, cannot exclude anterior infarct old. LVH. Repolarization of mallet the versus high lateral ischemia.  Telemetry:  05/01/2015: Paroxysmal A. Fib with RVR. Demand A-V Pace..  04/30/15: PAF, VVI paced. 04/29/15: Episodes of atrial fibrillation with 5.9 second ventricle causes. Normal sinus rhythm with episodes of junctional escape rhythm. Normal sinus rhythm with episodes of 3-5 second pauses.   Scheduled Meds: . amiodarone  400 mg Oral BID  .  antiseptic oral rinse  7 mL Mouth Rinse BID  . [START ON 05/02/2015] apixaban  5 mg Oral BID  . cholecalciferol  2,000 Units Oral Daily   . fenofibrate  160 mg Oral Q supper  . furosemide  40 mg Oral Daily  . irbesartan  300 mg Oral Daily   And  . hydrochlorothiazide  12.5 mg Oral Daily  . insulin aspart  0-15 Units Subcutaneous TID WC  . insulin aspart  0-5 Units Subcutaneous QHS  . insulin aspart protamine- aspart  40 Units Subcutaneous BID WC  . isosorbide mononitrate  30 mg Oral Daily  . levothyroxine  50 mcg Oral QAC breakfast  . metFORMIN  1,000 mg Oral BID WC  . metoprolol tartrate  100 mg Oral BID  . omega-3 acid ethyl esters  1 g Oral TID  . pravastatin  40 mg Oral QHS  . sodium chloride flush  3 mL Intravenous Q12H   Continuous Infusions: . sodium chloride Stopped (04/30/15 0834)  . sodium chloride 10 mL/hr at 04/30/15 0835   PRN Meds:.acetaminophen, HYDROmorphone (DILAUDID) injection, LORazepam, ondansetron (ZOFRAN) IV, ondansetron **OR** [DISCONTINUED] ondansetron (ZOFRAN) IV   Assessment/Plan:  1. Sick sinus syndrome S/P PTVP Medtronic MRI compatible leads, regular pacer, Medtronic Adapta Dr Peggyann Juba 04/30/2015. 2. PAF CHA2DS2-VASc Score is 5 with yearly risk of stroke of 6.7%. HAS-Bled score is 1 and estimated major bleeding in one year is 1.02-1.5%.  Rec: Continue Amiodarone at high dose until discharge and I have restarted Metoprolol 100 mg BID. Keep her another 24 hours for observation due to A. Fib with RVR.  I will f/u on 05/17/15 at 3:45 pm. D/C on Amiodarone 200 mg BID and Metoprolol 100mg  BID  [name], M.D. 05/01/2015, 11:39 AM [facility] Pager: [phone] Office: [phone] If no answer: [phone]

## 2015-05-02 LAB — CBC
HCT: 40 % (ref 36.0–46.0)
Hemoglobin: 12.9 g/dL (ref 12.0–15.0)
MCH: 28.9 pg (ref 26.0–34.0)
MCHC: 32.3 g/dL (ref 30.0–36.0)
MCV: 89.7 fL (ref 78.0–100.0)
PLATELETS: 134 10*3/uL — AB (ref 150–400)
RBC: 4.46 MIL/uL (ref 3.87–5.11)
RDW: 14.4 % (ref 11.5–15.5)
WBC: 9.2 10*3/uL (ref 4.0–10.5)

## 2015-05-02 LAB — ECHOCARDIOGRAM COMPLETE
HEIGHTINCHES: 62 in
WEIGHTICAEL: 3181.68 [oz_av]

## 2015-05-02 LAB — BASIC METABOLIC PANEL
Anion gap: 10 (ref 5–15)
BUN: 13 mg/dL (ref 6–20)
CALCIUM: 9.8 mg/dL (ref 8.9–10.3)
CO2: 34 mmol/L — AB (ref 22–32)
CREATININE: 1.12 mg/dL — AB (ref 0.44–1.00)
Chloride: 95 mmol/L — ABNORMAL LOW (ref 101–111)
GFR, EST AFRICAN AMERICAN: 55 mL/min — AB (ref >60)
GFR, EST NON AFRICAN AMERICAN: 48 mL/min — AB (ref >60)
Glucose, Bld: 168 mg/dL — ABNORMAL HIGH (ref 65–99)
Potassium: 4 mmol/L (ref 3.5–5.1)
Sodium: 139 mmol/L (ref 135–145)

## 2015-05-02 LAB — URINE CULTURE: Culture: >=100000 — AB

## 2015-05-02 LAB — PROTIME-INR
INR: 1.78 — AB (ref 0.00–1.49)
PROTHROMBIN TIME: 20.6 s — AB (ref 11.6–15.2)

## 2015-05-02 LAB — GLUCOSE, CAPILLARY: Glucose-Capillary: 194 mg/dL — ABNORMAL HIGH (ref 65–99)

## 2015-05-02 MED ORDER — AMIODARONE HCL 200 MG PO TABS: 200.0000 mg | ORAL_TABLET | Freq: Two times a day (BID) | ORAL | Status: DC

## 2015-05-02 MED ORDER — APIXABAN 5 MG PO TABS: 5.0000 mg | ORAL_TABLET | Freq: Two times a day (BID) | ORAL | Status: DC

## 2015-05-02 MED ORDER — CIPROFLOXACIN HCL 250 MG PO TABS: 250.0000 mg | ORAL_TABLET | Freq: Two times a day (BID) | ORAL | Status: DC

## 2015-05-02 MED ORDER — CIPROFLOXACIN HCL 500 MG PO TABS: 250.0000 mg | ORAL_TABLET | Freq: Two times a day (BID) | ORAL | Status: DC

## 2015-05-02 NOTE — Care Management Important Message (Signed)
Important Message  Patient Details  Name: [name] MRN: [id number] Date of Birth: 12-23-1945   Medicare Important Message Given:  Yes    Nathen May 05/02/2015, 9:54 AM

## 2015-05-02 NOTE — Care Management Note (Signed)
Case Management Note Marvetta Gibbons RN, BSN Unit 2W-Case Manager [phone]  Patient Details  Name: [name] MRN: [id number] Date of Birth: [birth year]  Subjective/Objective:  Pt admitted with afib                  Action/Plan: PTA pt lived at home- independent- plan to return home- referral received for insurance check on Pradaxa/Xarelto/Eliquis- pt to go home on Eliquis- copay cost is $47/mo- spoke with pt at bedside- copay cost shared and 30 day free card given to use. No other CM needs noted.   Expected Discharge Date:    05/02/15              Expected Discharge Plan:  Home/Self Care  In-House Referral:     Discharge planning Services  CM Consult, Medication Assistance  Post Acute Care Choice:    Choice offered to:     DME Arranged:    DME Agency:     HH Arranged:    HH Agency:     Status of Service:  Completed, signed off  Medicare Important Message Given:  Yes Date Medicare IM Given:    Medicare IM give by:    Date Additional Medicare IM Given:    Additional Medicare Important Message give by:     If discussed at [place] of Stay Meetings, dates discussed:    Additional Comments:  Dawayne Patricia, RN 05/02/2015, 10:36 AM

## 2015-05-02 NOTE — Progress Notes (Signed)
Subjective:  Doing well. No dizziness and dyspnea improved.  Slept flat in bed.   Objective:  Vital Signs in the last 24 hours: Temp:  [97.7 F (36.5 C)-98.3 F (36.8 C)] 97.8 F (36.6 C) (04/19 0412) Pulse Rate:  [50-126] 61 (04/19 0412) Resp:  [16-17] 16 (04/19 0412) BP: (120-145)/(41-61) 142/47 mmHg (04/19 0412) SpO2:  [98 %-100 %] 98 % (04/19 0412)  Intake/Output from previous day: 04/18 0701 - 04/19 0700 In: 480 [P.O.:480] Out: 700 [Urine:700]  Body mass index is 36.36 kg/(m^2).  Physical Exam:  General appearance: alert, cooperative, appears stated age, no distress, moderately obese and Short stature Lungs: clear to auscultation bilaterally. Left pacemaker pocket is stable without hematoma.  Heart: regular rhythm, S1 , S2 normal, no murmur, click, rub or gallop Abdomen: soft, non-tender; bowel sounds normal; no masses, no organomegaly and obese with mild pannus Extremities: extremities normal, atraumatic, no cyanosis or edema Pulses: 2+ and symmetric Neurologic: Grossly normal Lab Results: BMP  Recent Labs  04/30/15 0249 05/01/15 1000 05/02/15 0416  NA 140 137 139  K 3.7 3.7 4.0  CL 96* 94* 95*  CO2 33* 32 34*  GLUCOSE 202* 286* 168*  BUN 10 8 13   CREATININE 1.02* 0.98 1.12*  CALCIUM 9.4 9.5 9.8  GFRNONAA 54* 56* 48*  GFRAA >60 >60 55*    CBC  Recent Labs Lab 05/02/15 0416  WBC 9.2  RBC 4.46  HGB 12.9  HCT 40.0  PLT 134*  MCV 89.7  MCH 28.9  MCHC 32.3  RDW 14.4    HEMOGLOBIN A1C Lab Results  Component Value Date   HGBA1C 8.3* 04/29/2015   MPG 192 04/29/2015    Recent Labs  04/29/15 1904 04/30/15 0050 04/30/15 0650  TROPONINI 0.05* 0.08* 0.08*    Recent Labs  04/29/15 1752 04/29/15 1903  TSH 1.145 1.050    Cardiac Studies:  Echocardiogram 03/21/2015: Left ventricle cavity is normal in size. Moderate concentric hypertrophy of the left ventricle. Doppler evidence suggestive of grade III (restrictive) diastolic dysfunction,  E/e' not done. There is mild anterior and anteroseptal hypokinesis. Calculated EF 45%. Left atrial cavity is severely dilated. Moderate mitral regurgitation. Trace tricuspid regurgitation. Unable to estimate PA pressure due to absence/minimal TR signal. Insignificant pericardial effusion. Compared to the study done on 02/23/13, wall motion abnormality new and EF was previously normal. Abnormal Echo.   Lexiscan myoview stress test 02/20/2014: 1. The resting electrocardiogram demonstrated normal sinus rhythm, normal resting conduction, poor R-wave progression, cannot exclude inferior infarct old, anterior infarct old. Pulmonary disease pattern. No resting arrhythmias and nonspecific ST-T changes in the inferior and lateral leads, cannot exclude ischemia. Stress EKG is nondiagnostic for ischemia as it is a Pharmacologic stress test using Lexiscan infusion. Stress symptoms included dyspnea, dizziness. 2. Myocardial perfusion imaging is normal. Overall left ventricular systolic function was normal without regional wall motion abnormalities. The left ventricular ejection fraction was 75%. No significant change from [place]: 07/12/10.  EKG 04/29/2015: Atrial fibrillation with rapid ventricular response. Left axis deviation, left plantar fascicular block. Poor R-wave progression, cannot exclude anterior infarct old. LVH. Repolarization of mallet the versus high lateral ischemia.  Telemetry:  05/02/2015: Paroxysmal episodes of atrial fibrillation, a paced V sensed rhythm. Occasional PVCs. 05/01/2015: Paroxysmal A. Fib with RVR. Demand A-V Pace..  04/30/15: PAF, VVI paced. 04/29/15: Episodes of atrial fibrillation with 5.9 second ventricle causes. Normal sinus rhythm with episodes of junctional escape rhythm. Normal sinus rhythm with episodes of 3-5 second pauses.   Scheduled  Meds: . amiodarone  400 mg Oral BID  . antiseptic oral rinse  7 mL Mouth Rinse BID  . apixaban  5 mg Oral BID  .  cholecalciferol  2,000 Units Oral Daily  . ciprofloxacin  250 mg Oral BID  . fenofibrate  160 mg Oral Q supper  . furosemide  40 mg Oral Daily  . irbesartan  300 mg Oral Daily   And  . hydrochlorothiazide  12.5 mg Oral Daily  . insulin aspart  0-15 Units Subcutaneous TID WC  . insulin aspart  0-5 Units Subcutaneous QHS  . insulin aspart protamine- aspart  40 Units Subcutaneous BID WC  . isosorbide mononitrate  30 mg Oral Daily  . levothyroxine  50 mcg Oral QAC breakfast  . metFORMIN  1,000 mg Oral BID WC  . metoprolol tartrate  100 mg Oral BID  . omega-3 acid ethyl esters  1 g Oral TID  . pravastatin  40 mg Oral QHS  . sodium chloride flush  3 mL Intravenous Q12H   Continuous Infusions: . sodium chloride Stopped (04/30/15 0834)  . sodium chloride 10 mL/hr at 04/30/15 0835   PRN Meds:.acetaminophen, HYDROmorphone (DILAUDID) injection, LORazepam, ondansetron (ZOFRAN) IV, ondansetron **OR** [DISCONTINUED] ondansetron (ZOFRAN) IV   Assessment/Plan:  1. Sick sinus syndrome and paroxysmal episodes of atrial fibrillation, tachycardia/bradycardia syndrome. S/P PTVP Medtronic MRI compatible leads, regular pacer, Medtronic Adapta Dr Peggyann Juba 04/30/2015. 2. PAF CHA2DS2-VASc Score is 5 with yearly risk of stroke of 6.7%. HAS-Bled score is 1 and estimated major bleeding in one year is 1.02-1.5%. 3. Chronic stage III a kidney disease due to diabetes mellitus 4. Chronic diastolic heart failure 5. Diabetes mellitus type 2 uncontrolled  Rec: Continue Amiodarone at high dose until discharge this morning and continue Metoprolol 100 mg BID.  I will f/u on 05/17/15 at 3:45 pm. D/C on Amiodarone 200 mg BID and Metoprolol 100mg  BID  Adrian Prows, M.D. 05/02/2015, 9:14 AM [facility] Pager: [phone] Office: [phone] If no answer: [phone]

## 2015-05-02 NOTE — Discharge Summary (Signed)
Physician Discharge Summary   Patient ID: MALEAHA OBRYANT MRN: [id number] DOB/AGE: 06-11-1945 70 y.o.  Admit date: 04/29/2015 Discharge date: 05/02/2015  Primary Care Physician:  Thressa Sheller, MD  Discharge Diagnoses:   . Tachycardia-bradycardia syndrome (HCC)Status post pacemaker . Atrial fibrillation with RVR (HCC)  Klebsiella UTI   . Moderate mitral regurgitation . CAD (coronary artery disease)  Diabetes mellitus type 2  Mild acute on chronic kidney disease stage II   Hypothyroidism   Hyperlipidemia    Consults:  Cardiology, Dr. Einar Gip EP cardiology, Dr. Lovena Le  Recommendations for Outpatient Follow-up:  Please repeat CBC/BMET at next visit   DIET: Carb modified diet    Allergies:   Allergies  Allergen Reactions  . Hydrocodone Other (See Comments)    lethargic  . Invokana [Canagliflozin] Other (See Comments)    Made heart race  . Oxycodone Other (See Comments)    lethargic     DISCHARGE MEDICATIONS: Current Discharge Medication List    START taking these medications   Details  amiodarone (PACERONE) 200 MG tablet Take 1 tablet (200 mg total) by mouth 2 (two) times daily. Qty: 60 tablet, Refills: 3    apixaban (ELIQUIS) 5 MG TABS tablet Take 1 tablet (5 mg total) by mouth 2 (two) times daily. Qty: 60 tablet, Refills: 3    ciprofloxacin (CIPRO) 250 MG tablet Take 1 tablet (250 mg total) by mouth 2 (two) times daily. X 3days Qty: 6 tablet, Refills: 0      CONTINUE these medications which have NOT CHANGED   Details  cholecalciferol (VITAMIN D) 1000 UNITS tablet Take 2,000 Units by mouth daily.     fenofibrate 160 MG tablet Take 160 mg by mouth daily with supper.     furosemide (LASIX) 40 MG tablet Take 40 mg by mouth daily.    insulin NPH-regular Human (NOVOLIN 70/30) (70-30) 100 UNIT/ML injection Inject 35-75 Units into the skin 2 (two) times daily with a meal. Inject 75 units subcutaneously with breakfast and 35 units with supper    isosorbide  mononitrate (IMDUR) 30 MG 24 hr tablet Take 1 tablet (30 mg total) by mouth daily. Qty: 30 tablet, Refills: 0    levothyroxine (SYNTHROID, LEVOTHROID) 50 MCG tablet Take 50 mcg by mouth at bedtime.     metFORMIN (GLUCOPHAGE) 500 MG tablet Take 1,000 mg by mouth 2 (two) times daily with a meal.    metoprolol (LOPRESSOR) 100 MG tablet Take 100 mg by mouth 2 (two) times daily.    Omega-3 Fatty Acids (FISH OIL) 1000 MG CAPS Take 1,000 mg by mouth 3 (three) times daily.    pravastatin (PRAVACHOL) 40 MG tablet Take 40 mg by mouth at bedtime.     valsartan-hydrochlorothiazide (DIOVAN-HCT) 320-12.5 MG per tablet Take 1 tablet by mouth daily.      STOP taking these medications     digoxin (LANOXIN) 0.25 MG tablet      warfarin (COUMADIN) 3 MG tablet      meclizine (ANTIVERT) 25 MG tablet      sulfamethoxazole-trimethoprim (BACTRIM,SEPTRA) 400-80 MG tablet          Brief H and P: For complete details please refer to admission H and P, but in brief Patient is a 70 year old female with history of atrial fibrillation, insulin-dependent diabetes mellitus, hypertension, hyperlipidemia, hypothyroidism, chronic kidney disease stage II, presented to ED with shortness of breath and palpitations since yesterday. History was obtained from the patient, her son in the room. Patient reported that she was placed on  Coumadin and digoxin for atrial fibrillation 3 weeks ago by her cardiologist. Her heart rate was high and irregular at the time of follow-up. Since then she has been feeling poorly, tired and short of breath. Today patient went to the church with her husband and felt as if her heart was going to stop and then it was racing. She denied any chest pain however she did have mild shortness of breath with dizziness. Patient came home and rested however her symptoms did not improve. Hence she came to the ED.  In ED, patient was noted to have atrial fibrillation with RVR with heart rate in 140s and then  subsequently extreme bradycardia, sinus in the 30s.  Hospital Course:   Atrial fibrillation with RVR [facility]), tachy-brady syndrome - Cardiology was consulted, patient had temporary external pacemaker placed On 4/16, permanent pacemaker placed on 4/17 - Troponins +0.08, likely due to demand ischemia from rapid atrial fibrillation - TSH 1.0  - Digoxin was discontinued. Metoprolol was restarted by cardiology - Patient also placed on amiodarone. Stable from EP standpoint. Patient has been cleared for discharge by Dr. Einar Gip, recommended amiodarone 200 mg twice a day and metoprolol 100 mg twice a day at the time of discharge.  - Patient prefers NOACs, case management looked into the co-pays and patient was started on liquids on 4/19.  Hypertension: Uncontrolled - Continue imdur, valsartan, HCTZ   Diabetes mellitus ([place]) type 2, insulin-dependent with renal complications - Uncontrolled, hemoglobin A1c 8.3 -Continue insulin per home regimen, follow-up by PCP  Mild acute on chronic kidney disease stage II - Creatinine 1.31 at the time of admission likely due to rapid atrial fibrillation and hypoperfusion. Lasix was held. Creatinine resolved to 1.1 now.  Hypothyroidism - TSH 1.0, continue Synthroid  Hyperlipidemia - Continue fenofibrate  Klebsiella UTI - Patient was placed on ciprofloxacin 250 mg twice a day for 3 days per sensitivities  Day of Discharge BP 142/47 mmHg  Pulse 61  Temp(Src) 97.8 F (36.6 C) (Oral)  Resp 16  Ht 5\' 2"  (1.575 m)  Wt 90.2 kg (198 lb 13.7 oz)  BMI 36.36 kg/m2  SpO2 98%  Physical Exam: General: Alert and awake oriented x3 not in any acute distress. HEENT: anicteric sclera, pupils reactive to light and accommodation CVS: S1-S2 clear no murmur rubs or gallops Chest: clear to auscultation bilaterally, no wheezing rales or rhonchi, pacemaker in the left chest wall Abdomen: soft nontender, nondistended, normal bowel sounds Extremities: no cyanosis,  clubbing or edema noted bilaterally Neuro: Cranial nerves II-XII intact, no focal neurological deficits   The results of significant diagnostics from this hospitalization (including imaging, microbiology, ancillary and laboratory) are listed below for reference.    LAB RESULTS: Basic Metabolic Panel:  Recent Labs Lab 05/01/15 1000 05/02/15 0416  NA 137 139  K 3.7 4.0  CL 94* 95*  CO2 32 34*  GLUCOSE 286* 168*  BUN 8 13  CREATININE 0.98 1.12*  CALCIUM 9.5 9.8   Liver Function Tests: No results for input(s): AST, ALT, ALKPHOS, BILITOT, PROT, ALBUMIN in the last 168 hours. No results for input(s): LIPASE, AMYLASE in the last 168 hours. No results for input(s): AMMONIA in the last 168 hours. CBC:  Recent Labs Lab 05/01/15 1000 05/02/15 0416  WBC 9.5 9.2  HGB 12.2 12.9  HCT 38.4 40.0  MCV 89.3 89.7  PLT 121* 134*   Cardiac Enzymes:  Recent Labs Lab 04/30/15 0050 04/30/15 0650  TROPONINI 0.08* 0.08*   BNP: Invalid input(s): POCBNP  CBG:  Recent Labs Lab 05/01/15 2216 05/02/15 0643  GLUCAP 195* 194*    Significant Diagnostic Studies:  Dg Chest 2 View  04/29/2015  CLINICAL DATA:  Pt reports intermittent SOB, tachycardia, and lightheadedness x 2-3 weeks; pt reports h/o DM, HTN, AFIB, and CHF; non-smoker EXAM: CHEST  2 VIEW COMPARISON:  05/30/2012 FINDINGS: Moderate thoracic spondylosis. Right hemidiaphragm eventration anteriorly. Midline trachea. Mild cardiomegaly. No pleural effusion or pneumothorax. Pulmonary interstitial thickening is decreased since the prior exam. No lobar consolidation. IMPRESSION: Cardiomegaly with pulmonary interstitial thickening, likely related to pulmonary venous congestion. Electronically Signed   By: Abigail Miyamoto M.D.   On: 04/29/2015 16:23    2D ECHO:   Disposition and Follow-up: Discharge Instructions    Diet Carb Modified    Complete by:  As directed      Discharge instructions    Complete by:  As directed   Please NOTE  coumadin has been stopped. Continue ELIQUIS twice a day.     Increase activity slowly    Complete by:  As directed             DISPOSITION: home    DISCHARGE FOLLOW-UP Follow-up Information    Follow up with [facility] On 05/10/2015.   Specialty:  Cardiology   Why:  at [facility] information:   [address], [place] [phone]      Follow up with Adrian Prows, MD On 05/17/2015.   Specialty:  Cardiology   Why:  at 3:45PM for hospital follow-up   Contact information:   [address] [place] [address] [phone]        Time spent on Discharge: 37 mins   Signed:   Jet Traynham M.D. [facility] 05/02/2015, 10:33 AM Pager: [phone]

## 2015-05-10 ENCOUNTER — Ambulatory Visit: Payer: Commercial Managed Care - HMO

## 2015-05-21 ENCOUNTER — Encounter: Payer: Self-pay | Admitting: Internal Medicine

## 2015-05-21 ENCOUNTER — Ambulatory Visit (INDEPENDENT_AMBULATORY_CARE_PROVIDER_SITE_OTHER): Payer: Commercial Managed Care - HMO | Admitting: *Deleted

## 2015-05-21 DIAGNOSIS — I495 Sick sinus syndrome: Secondary | ICD-10-CM | POA: Diagnosis not present

## 2015-05-21 LAB — CUP PACEART INCLINIC DEVICE CHECK
Battery Remaining Longevity: 127 mo
Battery Voltage: 2.79 V
Brady Statistic AP VP Percent: 0 %
Brady Statistic AP VS Percent: 97 %
Brady Statistic AS VP Percent: 2 %
Date Time Interrogation Session: 20170508144954
Implantable Lead Implant Date: 20170417
Implantable Lead Location: 753860
Implantable Lead Model: 5076
Lead Channel Impedance Value: 627 Ohm
Lead Channel Pacing Threshold Amplitude: 0.5 V
Lead Channel Pacing Threshold Amplitude: 0.625 V
Lead Channel Pacing Threshold Amplitude: 0.75 V
Lead Channel Sensing Intrinsic Amplitude: 22.4 mV
Lead Channel Setting Pacing Amplitude: 3.5 V
Lead Channel Setting Pacing Amplitude: 3.5 V
Lead Channel Setting Sensing Sensitivity: 5.6 mV
MDC IDC LEAD IMPLANT DT: 20170417
MDC IDC LEAD LOCATION: 753859
MDC IDC MSMT BATTERY IMPEDANCE: 100 Ohm
MDC IDC MSMT LEADCHNL RA IMPEDANCE VALUE: 575 Ohm
MDC IDC MSMT LEADCHNL RA PACING THRESHOLD AMPLITUDE: 0.5 V
MDC IDC MSMT LEADCHNL RA PACING THRESHOLD PULSEWIDTH: 0.4 ms
MDC IDC MSMT LEADCHNL RA PACING THRESHOLD PULSEWIDTH: 0.4 ms
MDC IDC MSMT LEADCHNL RV PACING THRESHOLD PULSEWIDTH: 0.4 ms
MDC IDC MSMT LEADCHNL RV PACING THRESHOLD PULSEWIDTH: 0.4 ms
MDC IDC SET LEADCHNL RV PACING PULSEWIDTH: 0.4 ms
MDC IDC STAT BRADY AS VS PERCENT: 2 %

## 2015-05-21 NOTE — Progress Notes (Signed)
Wound check appointment. Steri-strips removed prior to appt. Wound without redness or edema. Incision edges approximated, wound well healed. Normal device function. Thresholds, sensing, and impedances consistent with implant measurements. Device programmed at 3.5V for extra safety margin until 3 month visit. Histogram distribution appropriate for patient and level of activity. No mode switches or high ventricular rates noted. Patient educated about wound care, arm mobility, lifting restrictions. ROV w/ GT prn per pt Einar Gip).

## 2015-09-30 ENCOUNTER — Encounter (HOSPITAL_COMMUNITY): Payer: Self-pay

## 2015-09-30 ENCOUNTER — Emergency Department (HOSPITAL_COMMUNITY): Payer: Commercial Managed Care - HMO

## 2015-09-30 ENCOUNTER — Observation Stay (HOSPITAL_COMMUNITY)
Admission: EM | Admit: 2015-09-30 | Discharge: 2015-10-01 | Disposition: A | Discharge status: Home / Self Care | Payer: Commercial Managed Care - HMO | Attending: Cardiovascular Disease | Admitting: Cardiovascular Disease

## 2015-09-30 DIAGNOSIS — Z955 Presence of coronary angioplasty implant and graft: Secondary | ICD-10-CM | POA: Insufficient documentation

## 2015-09-30 DIAGNOSIS — Z7901 Long term (current) use of anticoagulants: Secondary | ICD-10-CM | POA: Diagnosis not present

## 2015-09-30 DIAGNOSIS — R079 Chest pain, unspecified: Secondary | ICD-10-CM

## 2015-09-30 DIAGNOSIS — I2510 Atherosclerotic heart disease of native coronary artery without angina pectoris: Secondary | ICD-10-CM | POA: Diagnosis not present

## 2015-09-30 DIAGNOSIS — I5031 Acute diastolic (congestive) heart failure: Secondary | ICD-10-CM | POA: Diagnosis not present

## 2015-09-30 DIAGNOSIS — I130 Hypertensive heart and chronic kidney disease with heart failure and stage 1 through stage 4 chronic kidney disease, or unspecified chronic kidney disease: Secondary | ICD-10-CM | POA: Diagnosis not present

## 2015-09-30 DIAGNOSIS — Z950 Presence of cardiac pacemaker: Secondary | ICD-10-CM | POA: Diagnosis not present

## 2015-09-30 DIAGNOSIS — Z794 Long term (current) use of insulin: Secondary | ICD-10-CM | POA: Insufficient documentation

## 2015-09-30 DIAGNOSIS — R0602 Shortness of breath: Secondary | ICD-10-CM | POA: Diagnosis not present

## 2015-09-30 DIAGNOSIS — R0789 Other chest pain: Principal | ICD-10-CM | POA: Insufficient documentation

## 2015-09-30 DIAGNOSIS — Z7984 Long term (current) use of oral hypoglycemic drugs: Secondary | ICD-10-CM | POA: Diagnosis not present

## 2015-09-30 DIAGNOSIS — E1122 Type 2 diabetes mellitus with diabetic chronic kidney disease: Secondary | ICD-10-CM | POA: Diagnosis not present

## 2015-09-30 DIAGNOSIS — N189 Chronic kidney disease, unspecified: Secondary | ICD-10-CM | POA: Diagnosis not present

## 2015-09-30 DIAGNOSIS — E1140 Type 2 diabetes mellitus with diabetic neuropathy, unspecified: Secondary | ICD-10-CM | POA: Diagnosis not present

## 2015-09-30 DIAGNOSIS — Z79899 Other long term (current) drug therapy: Secondary | ICD-10-CM | POA: Insufficient documentation

## 2015-09-30 LAB — BASIC METABOLIC PANEL
Anion gap: 12 (ref 5–15)
BUN: 21 mg/dL — ABNORMAL HIGH (ref 6–20)
CALCIUM: 9.8 mg/dL (ref 8.9–10.3)
CO2: 26 mmol/L (ref 22–32)
CREATININE: 1.37 mg/dL — AB (ref 0.44–1.00)
Chloride: 97 mmol/L — ABNORMAL LOW (ref 101–111)
GFR, EST AFRICAN AMERICAN: 43 mL/min — AB (ref >60)
GFR, EST NON AFRICAN AMERICAN: 38 mL/min — AB (ref >60)
Glucose, Bld: 341 mg/dL — ABNORMAL HIGH (ref 65–99)
Potassium: 3.7 mmol/L (ref 3.5–5.1)
SODIUM: 135 mmol/L (ref 135–145)

## 2015-09-30 LAB — CBG MONITORING, ED: Glucose-Capillary: 102 mg/dL — ABNORMAL HIGH (ref 65–99)

## 2015-09-30 LAB — CBC
HCT: 41.9 % (ref 36.0–46.0)
Hemoglobin: 13.6 g/dL (ref 12.0–15.0)
MCH: 30 pg (ref 26.0–34.0)
MCHC: 32.5 g/dL (ref 30.0–36.0)
MCV: 92.3 fL (ref 78.0–100.0)
PLATELETS: 179 10*3/uL (ref 150–400)
RBC: 4.54 MIL/uL (ref 3.87–5.11)
RDW: 13.9 % (ref 11.5–15.5)
WBC: 7.6 10*3/uL (ref 4.0–10.5)

## 2015-09-30 LAB — GLUCOSE, CAPILLARY: Glucose-Capillary: 196 mg/dL — ABNORMAL HIGH (ref 65–99)

## 2015-09-30 LAB — TROPONIN I: TROPONIN I: 0.03 ng/mL — AB (ref <0.03)

## 2015-09-30 LAB — TSH: TSH: 3.511 u[IU]/mL (ref 0.350–4.500)

## 2015-09-30 LAB — I-STAT TROPONIN, ED
TROPONIN I, POC: 0.01 ng/mL (ref 0.00–0.08)
Troponin i, poc: 0.01 ng/mL (ref 0.00–0.08)

## 2015-09-30 LAB — BRAIN NATRIURETIC PEPTIDE: B Natriuretic Peptide: 80.8 pg/mL (ref 0.0–100.0)

## 2015-09-30 MED ORDER — HYDROCHLOROTHIAZIDE 12.5 MG PO CAPS
12.5000 mg | ORAL_CAPSULE | Freq: Every day | ORAL | Status: DC
  Administered 2015-10-01: 12.5 mg via ORAL
  Filled 2015-09-30: qty 1

## 2015-09-30 MED ORDER — APIXABAN 5 MG PO TABS
5.0000 mg | ORAL_TABLET | Freq: Two times a day (BID) | ORAL | Status: DC
  Administered 2015-09-30 – 2015-10-01 (×2): 5 mg via ORAL
  Filled 2015-09-30 (×2): qty 1

## 2015-09-30 MED ORDER — AMIODARONE HCL 200 MG PO TABS
100.0000 mg | ORAL_TABLET | Freq: Every day | ORAL | Status: DC
  Administered 2015-10-01: 100 mg via ORAL
  Filled 2015-09-30: qty 1

## 2015-09-30 MED ORDER — ACETAMINOPHEN 325 MG PO TABS: 650.0000 mg | ORAL_TABLET | Freq: Four times a day (QID) | ORAL | Status: DC | PRN

## 2015-09-30 MED ORDER — FENOFIBRATE 160 MG PO TABS
160.0000 mg | ORAL_TABLET | Freq: Every day | ORAL | Status: DC
  Administered 2015-09-30 – 2015-10-01 (×2): 160 mg via ORAL
  Filled 2015-09-30 (×2): qty 1

## 2015-09-30 MED ORDER — ENOXAPARIN SODIUM 40 MG/0.4ML ~~LOC~~ SOLN: 40.0000 mg | SUBCUTANEOUS | Status: DC

## 2015-09-30 MED ORDER — SODIUM CHLORIDE 0.9% FLUSH
3.0000 mL | Freq: Two times a day (BID) | INTRAVENOUS | Status: DC
  Administered 2015-09-30: 3 mL via INTRAVENOUS

## 2015-09-30 MED ORDER — FUROSEMIDE 10 MG/ML IJ SOLN
40.0000 mg | Freq: Once | INTRAMUSCULAR | Status: AC
  Administered 2015-09-30: 40 mg via INTRAVENOUS
  Filled 2015-09-30: qty 4

## 2015-09-30 MED ORDER — NITROGLYCERIN 0.4 MG SL SUBL
0.4000 mg | SUBLINGUAL_TABLET | SUBLINGUAL | Status: DC | PRN
  Filled 2015-09-30: qty 1

## 2015-09-30 MED ORDER — ONDANSETRON HCL 4 MG PO TABS: 4.0000 mg | ORAL_TABLET | Freq: Four times a day (QID) | ORAL | Status: DC | PRN

## 2015-09-30 MED ORDER — METOPROLOL TARTRATE 25 MG PO TABS
50.0000 mg | ORAL_TABLET | Freq: Two times a day (BID) | ORAL | Status: DC
  Administered 2015-09-30 – 2015-10-01 (×2): 50 mg via ORAL
  Filled 2015-09-30 (×2): qty 2

## 2015-09-30 MED ORDER — INSULIN ASPART PROT & ASPART (70-30 MIX) 100 UNIT/ML ~~LOC~~ SUSP
35.0000 [IU] | Freq: Every day | SUBCUTANEOUS | Status: DC
  Administered 2015-10-01: 35 [IU] via SUBCUTANEOUS
  Filled 2015-09-30: qty 10

## 2015-09-30 MED ORDER — DOCUSATE SODIUM 100 MG PO CAPS
100.0000 mg | ORAL_CAPSULE | Freq: Two times a day (BID) | ORAL | Status: DC
  Administered 2015-09-30: 100 mg via ORAL
  Filled 2015-09-30 (×2): qty 1

## 2015-09-30 MED ORDER — ASPIRIN 81 MG PO CHEW
324.0000 mg | CHEWABLE_TABLET | Freq: Once | ORAL | Status: AC
  Administered 2015-09-30: 324 mg via ORAL
  Filled 2015-09-30: qty 4

## 2015-09-30 MED ORDER — VITAMIN D 1000 UNITS PO TABS
2000.0000 [IU] | ORAL_TABLET | Freq: Two times a day (BID) | ORAL | Status: DC
  Administered 2015-09-30 – 2015-10-01 (×2): 2000 [IU] via ORAL
  Filled 2015-09-30 (×2): qty 2

## 2015-09-30 MED ORDER — IRBESARTAN 150 MG PO TABS
300.0000 mg | ORAL_TABLET | Freq: Every day | ORAL | Status: DC
  Administered 2015-10-01: 300 mg via ORAL
  Filled 2015-09-30: qty 2

## 2015-09-30 MED ORDER — LEVOTHYROXINE SODIUM 50 MCG PO TABS
50.0000 ug | ORAL_TABLET | Freq: Every day | ORAL | Status: DC
  Administered 2015-09-30: 50 ug via ORAL
  Filled 2015-09-30: qty 1

## 2015-09-30 MED ORDER — SODIUM CHLORIDE 0.9 % IV SOLN: 250.0000 mL | INTRAVENOUS | Status: DC | PRN

## 2015-09-30 MED ORDER — SODIUM CHLORIDE 0.9% FLUSH: 3.0000 mL | INTRAVENOUS | Status: DC | PRN

## 2015-09-30 MED ORDER — METFORMIN HCL 500 MG PO TABS
1000.0000 mg | ORAL_TABLET | Freq: Two times a day (BID) | ORAL | Status: DC
  Administered 2015-09-30 – 2015-10-01 (×3): 1000 mg via ORAL
  Filled 2015-09-30 (×4): qty 2

## 2015-09-30 MED ORDER — OMEGA-3-ACID ETHYL ESTERS 1 G PO CAPS
1.0000 g | ORAL_CAPSULE | Freq: Two times a day (BID) | ORAL | Status: DC
  Administered 2015-09-30 – 2015-10-01 (×2): 1 g via ORAL
  Filled 2015-09-30 (×2): qty 1

## 2015-09-30 MED ORDER — ONDANSETRON HCL 4 MG/2ML IJ SOLN: 4.0000 mg | Freq: Four times a day (QID) | INTRAMUSCULAR | Status: DC | PRN

## 2015-09-30 MED ORDER — PRAVASTATIN SODIUM 40 MG PO TABS
40.0000 mg | ORAL_TABLET | Freq: Every day | ORAL | Status: DC
  Administered 2015-09-30: 40 mg via ORAL
  Filled 2015-09-30: qty 1

## 2015-09-30 MED ORDER — VALSARTAN-HYDROCHLOROTHIAZIDE 320-12.5 MG PO TABS: 1.0000 | ORAL_TABLET | Freq: Every day | ORAL | Status: DC

## 2015-09-30 MED ORDER — ACETAMINOPHEN 650 MG RE SUPP: 650.0000 mg | Freq: Four times a day (QID) | RECTAL | Status: DC | PRN

## 2015-09-30 MED ORDER — INSULIN ASPART PROT & ASPART (70-30 MIX) 100 UNIT/ML ~~LOC~~ SUSP
75.0000 [IU] | Freq: Every day | SUBCUTANEOUS | Status: DC
  Administered 2015-10-01: 75 [IU] via SUBCUTANEOUS
  Filled 2015-09-30: qty 10

## 2015-09-30 MED ORDER — ISOSORBIDE MONONITRATE ER 30 MG PO TB24
30.0000 mg | ORAL_TABLET | Freq: Every day | ORAL | Status: DC
  Administered 2015-09-30: 30 mg via ORAL
  Filled 2015-09-30: qty 1

## 2015-09-30 NOTE — ED Provider Notes (Signed)
[facility] DEPT Provider Note   CSN: [id number] Arrival date & time: 09/30/15  0930     History   Chief Complaint Chief Complaint  Patient presents with  . Chest Pain    HPI [name] is a 70 y.o. female with history of CHF, atrial fibrillation, pacemaker in place who presents with chest heaviness, shortness of breath, generalized fatigue and weakness. Her symptoms are worse on exertion. Patient reports that she began feeling like her heart was racing this morning and had the above symptoms following. Patient has also had associated lightheadedness intermittently. She describes it as a woozy feeling. Patient felt nauseated in triage, but no vomiting. She does not go nausea at this time. Patient denies any abdominal pain or urinary symptoms. Patient reports that she has been out of her metoprolol since yesterday and has not had her dose yesterday or today. Patient's cardiologist is Dr. Einar Gip.  HPI  Past Medical History:  Diagnosis Date  . CHF (congestive heart failure) ([place])   . Diabetes mellitus without complication ([place])   . Hyperlipemia   . Hypertension   . Mitral valve disorders   . Obstructive sleep apnea   . Peripheral neuropathy ([place])   . Renal disorder Kidney disease stage2  . Thyroid disease hypothyroid  . Vitamin D deficiency     Patient Active Problem List   Diagnosis Date Noted  . Shortness of breath 09/30/2015  . Atrial fibrillation with RVR ([place]) 04/29/2015  . Tachycardia-bradycardia syndrome ([place]) 04/29/2015  . Complex sleep apnea syndrome 04/29/2014  . CAD (coronary artery disease) 04/29/2014  . Moderate mitral regurgitation 04/29/2014  . Diabetes mellitus ([place]) 06/02/2012  . Nausea alone 06/01/2012  . Respiratory failure ([place]) 05/30/2012  . Acute diastolic heart failure ([place]) 05/30/2012  . Acute on chronic renal failure ([place]) 05/30/2012  . Leukocytosis 05/30/2012  . OBESITY 08/02/2008  . ENTERITIS 08/02/2008  . DIVERTICULOSIS OF COLON  08/02/2008  . HEMOCCULT POSITIVE STOOL 08/02/2008  . NAUSEA AND VOMITING 08/02/2008  . DIARRHEA 08/02/2008  . ABDOMINAL PAIN 08/02/2008  . FEVER, HX OF 08/02/2008    Past Surgical History:  Procedure Laterality Date  . CARDIAC CATHETERIZATION N/A 04/29/2015   Procedure: Temporary Pacemaker;  Surgeon: Adrian Prows, MD;  Location: [facility] CV LAB;  Service: Cardiovascular;  Laterality: N/A;  . EP IMPLANTABLE DEVICE N/A 04/30/2015   Procedure: Pacemaker Implant;  Surgeon: Evans Lance, MD;  Location: [facility] CV LAB;  Service: Cardiovascular;  Laterality: N/A;  . LEFT AND RIGHT HEART CATHETERIZATION WITH CORONARY ANGIOGRAM N/A 09/30/2011   Procedure: LEFT AND RIGHT HEART CATHETERIZATION WITH CORONARY ANGIOGRAM;  Surgeon: Laverda Page, MD;  Location[facility] CATH LAB;  Service: Cardiovascular;  Laterality: N/A;  . YAG LASER APPLICATION Right [id number]   Procedure: YAG LASER APPLICATION;  Surgeon: Elta Guadeloupe T. Gershon Crane, MD;  Location: [facility] ORS;  Service: Ophthalmology;  Laterality: Right;  pt knows to arrive at 11:15  . YAG LASER APPLICATION Left [id number]   Procedure: YAG LASER APPLICATION;  Surgeon: Rutherford Guys, MD;  Location: [facility] ORS;  Service: Ophthalmology;  Laterality: Left;    OB History    No data available       Home Medications    Prior to Admission medications   Medication Sig Start Date End Date Taking? Authorizing Provider  amiodarone (PACERONE) 200 MG tablet Take 1 tablet (200 mg total) by mouth 2 (two) times daily. Patient taking differently: Take 100 mg by mouth daily.  05/02/15  Yes Ripudeep Krystal Eaton,  MD  apixaban (ELIQUIS) 5 MG TABS tablet Take 1 tablet (5 mg total) by mouth 2 (two) times daily. 05/02/15  Yes Ripudeep Krystal Eaton, MD  cholecalciferol (VITAMIN D) 1000 UNITS tablet Take 2,000 Units by mouth 2 (two) times daily.    Yes Historical Provider, MD  fenofibrate 160 MG tablet Take 160 mg by mouth daily with supper.    Yes Historical Provider, MD  furosemide (LASIX) 40 MG tablet  Take 40 mg by mouth daily.   Yes Historical Provider, MD  insulin NPH-regular Human (NOVOLIN 70/30) (70-30) 100 UNIT/ML injection Inject 35-75 Units into the skin 2 (two) times daily with a meal. Inject 75 units subcutaneously with breakfast and 35 units with supper   Yes Historical Provider, MD  isosorbide mononitrate (IMDUR) 30 MG 24 hr tablet Take 1 tablet (30 mg total) by mouth daily. Patient taking differently: Take 30 mg by mouth at bedtime.  06/02/12  Yes Domenic Polite, MD  levothyroxine (SYNTHROID, LEVOTHROID) 50 MCG tablet Take 50 mcg by mouth at bedtime.    Yes Historical Provider, MD  metFORMIN (GLUCOPHAGE) 500 MG tablet Take 1,000 mg by mouth 2 (two) times daily with a meal.   Yes Historical Provider, MD  metoprolol (LOPRESSOR) 100 MG tablet Take 100 mg by mouth 2 (two) times daily.   Yes Historical Provider, MD  Omega-3 Fatty Acids (FISH OIL) 1000 MG CAPS Take 1,000 mg by mouth 3 (three) times daily.   Yes Historical Provider, MD  pravastatin (PRAVACHOL) 40 MG tablet Take 40 mg by mouth at bedtime.    Yes Historical Provider, MD  valsartan-hydrochlorothiazide (DIOVAN-HCT) 320-12.5 MG per tablet Take 1 tablet by mouth daily.   Yes Historical Provider, MD    Family History Family History  Problem Relation Age of Onset  . Family history unknown: Yes    Social History Social History  Substance Use Topics  . Smoking status: Never Smoker  . Smokeless tobacco: Never Used  . Alcohol use No     Allergies   Hydrocodone; Invokana [canagliflozin]; and Oxycodone   Review of Systems Review of Systems  Constitutional: Negative for chills, diaphoresis, fatigue and fever.  HENT: Negative for facial swelling and sore throat.   Respiratory: Positive for shortness of breath.   Cardiovascular: Positive for chest pain (heaviness). Negative for leg swelling.  Gastrointestinal: Positive for nausea. Negative for abdominal pain and vomiting.  Genitourinary: Negative for dysuria.    Musculoskeletal: Negative for back pain.  Skin: Negative for rash and wound.  Neurological: Positive for light-headedness. Negative for headaches.  Psychiatric/Behavioral: The patient is not nervous/anxious.      Physical Exam Updated Vital Signs BP (!) 143/53   Pulse 69   Temp 97.7 F (36.5 C) (Oral)   Resp 25   Ht 5\' 2"  (1.575 m)   Wt 90.3 kg   SpO2 93%   BMI 36.41 kg/m   Physical Exam  Constitutional: She appears well-developed and well-nourished. No distress.  HENT:  Head: Normocephalic and atraumatic.  Mouth/Throat: Oropharynx is clear and moist. No oropharyngeal exudate.  Eyes: Conjunctivae are normal. Pupils are equal, round, and reactive to light. Right eye exhibits no discharge. Left eye exhibits no discharge. No scleral icterus.  Neck: Normal range of motion. Neck supple. No thyromegaly present.  Cardiovascular: Normal rate, regular rhythm, normal heart sounds and intact distal pulses.  Exam reveals no gallop and no friction rub.   No murmur heard. Pulmonary/Chest: Effort normal and breath sounds normal. No stridor. No respiratory distress.  She has no wheezes.  Crackles at both bases  Abdominal: Soft. Bowel sounds are normal. She exhibits no distension. There is no tenderness. There is no rebound and no guarding.  Musculoskeletal: She exhibits no edema.  No calf TTP  Lymphadenopathy:    She has no cervical adenopathy.  Neurological: She is alert. Coordination normal.  Skin: Skin is warm and dry. No rash noted. She is not diaphoretic. No pallor.  Psychiatric: She has a normal mood and affect.  Nursing note and vitals reviewed.    ED Treatments / Results  Labs (all labs ordered are listed, but only abnormal results are displayed) Labs Reviewed  BASIC METABOLIC PANEL - Abnormal; Notable for the following:       Result Value   Chloride 97 (*)    Glucose, Bld 341 (*)    BUN 21 (*)    Creatinine, Ser 1.37 (*)    GFR calc non Af Amer 38 (*)    GFR calc Af  Amer 43 (*)    All other components within normal limits  CBG MONITORING, ED - Abnormal; Notable for the following:    Glucose-Capillary 102 (*)    All other components within normal limits  CBC  BRAIN NATRIURETIC PEPTIDE  BASIC METABOLIC PANEL  CBC  TROPONIN I  TROPONIN I  LIPID PANEL  I-STAT TROPOININ, ED  I-STAT TROPOININ, ED    EKG  EKG Interpretation  Date/Time:  Sunday September 30 2015 09:37:30 EDT Ventricular Rate:  70 PR Interval:  202 QRS Duration: 112 QT Interval:  430 QTC Calculation: 464 R Axis:   -12 Text Interpretation:  Atrial-paced rhythm Septal infarct , age undetermined Abnormal ECG no significant change since April 2017 Confirmed by Regenia Skeeter MD, [facility] [phone]) on 09/30/2015 10:41:12 AM       Radiology Dg Chest 2 View  Result Date: 09/30/2015 CLINICAL DATA:  Heaviness and pressure in mid chest since this morning, tachycardia this morning, lightheadedness and weakness since last night, history atrial fibrillation, hypertension, diabetes mellitus, pacemaker EXAM: CHEST  2 VIEW COMPARISON:  05/01/2015 FINDINGS: LEFT subclavian transvenous pacemaker with leads projecting at RIGHT atrium and RIGHT ventricle. Enlargement of cardiac silhouette with pulmonary vascular congestion. Mediastinal contours normal. Eventration anterior RIGHT diaphragm unchanged. Mild bronchitic changes and minimal RIGHT basilar atelectasis. No acute infiltrate, pleural effusion or pneumothorax. Scattered degenerative disc and facet disease changes of the cervicothoracic spine. IMPRESSION: Enlargement of cardiac silhouette with pulmonary vascular congestion post pacemaker. Bronchitic changes with minimal RIGHT basilar atelectasis. Electronically Signed   By: Lavonia Dana M.D.   On: 09/30/2015 10:38    Procedures Procedures (including critical care time)  Medications Ordered in ED Medications  nitroGLYCERIN (NITROSTAT) SL tablet 0.4 mg (not administered)  sodium chloride flush (NS) 0.9 %  injection 3 mL (not administered)  sodium chloride flush (NS) 0.9 % injection 3 mL (not administered)  0.9 %  sodium chloride infusion (not administered)  acetaminophen (TYLENOL) tablet 650 mg (not administered)    Or  acetaminophen (TYLENOL) suppository 650 mg (not administered)  docusate sodium (COLACE) capsule 100 mg (not administered)  ondansetron (ZOFRAN) tablet 4 mg (not administered)    Or  ondansetron (ZOFRAN) injection 4 mg (not administered)  amiodarone (PACERONE) tablet 100 mg (not administered)  cholecalciferol (VITAMIN D) tablet 2,000 Units (not administered)  fenofibrate tablet 160 mg (not administered)  insulin aspart protamine- aspart (NOVOLOG MIX 70/30) injection 35 Units (not administered)  isosorbide mononitrate (IMDUR) 24 hr tablet 30 mg (not administered)  levothyroxine (  SYNTHROID, LEVOTHROID) tablet 50 mcg (not administered)  metFORMIN (GLUCOPHAGE) tablet 1,000 mg (not administered)  metoprolol tartrate (LOPRESSOR) tablet 50 mg (not administered)  omega-3 acid ethyl esters (LOVAZA) capsule 1 g (not administered)  pravastatin (PRAVACHOL) tablet 40 mg (not administered)  valsartan-hydrochlorothiazide (DIOVAN-HCT) 320-12.5 MG per tablet 1 tablet (not administered)  apixaban (ELIQUIS) tablet 5 mg (not administered)  furosemide (LASIX) injection 40 mg (not administered)  aspirin chewable tablet 324 mg (324 mg Oral Given 09/30/15 1337)     Initial Impression / Assessment and Plan / ED Course  I have reviewed the triage vital signs and the nursing notes.  Pertinent labs & imaging results that were available during my care of the patient were reviewed by me and considered in my medical decision making (see chart for details).  Clinical Course   CBC unremarkable. BMP shows chloride 97, glucose 341, BUN 21, creatinine 1.7. BNP 80.8. Initial troponin 0.01. Delta troponin pending. CXR shows enlargement of cardiac silhouette with pulmonary vascular congestion post pacemaker;  bronchitic changes with minimal right basilar atelectasis. EKG shows atrial-paced rhythm; Septal infarct , age undetermined, no significant change since April 2017. Patient with either pacemaker dysfunction or symptomatic pacemaker changes. [employer] reported the patient was in a junctional rhythm, causing pacemaker dysfunction. The predicted patient's interaction and metoprolol may have caused this junctional rhythm. Dr. Doylene Canard will came to evaluate the patient and will admit her for observation overnight. Patient vitals stable throughout ED course. I discussed patient case with Dr. Regenia Skeeter who guided the patient's management and agrees with plan.   Final Clinical Impressions(s) / ED Diagnoses   Final diagnoses:  Chest pain, unspecified chest pain type  Shortness of breath    New Prescriptions New Prescriptions   No medications on file     Frederica Kuster, PA-C 09/30/15 [place], MD 10/01/15 [phone]

## 2015-09-30 NOTE — H&P (Signed)
Referring Physician:  [name] is an 70 y.o. female.                       Chief Complaint: Chest heaviness  HPI: 70 year old female with history of hypertension, hyperlipidemia, CHF, atrial fibrillation, pacemaker placement, poorly controlled DM, II presented with chest heaviness, shortness of breath, generalized fatigue and weakness. She also ran out of her metoprolol since yesterday. Pacemaker interrogation showed new junctional rhythm at rate of 60. Her base rate was increased to 70 and AV delay was increased to 300 ms. Her BNP and Troponin-I were normal but chest x-ray showed cardiac enlargement with pulmonary vascular congestion.   Past Medical History:  Diagnosis Date  . CHF (congestive heart failure) ([place])   . Diabetes mellitus without complication ([place])   . Hyperlipemia   . Hypertension   . Mitral valve disorders   . Obstructive sleep apnea   . Peripheral neuropathy ([place])   . Renal disorder Kidney disease stage2  . Thyroid disease hypothyroid  . Vitamin D deficiency       Past Surgical History:  Procedure Laterality Date  . CARDIAC CATHETERIZATION N/A 04/29/2015   Procedure: Temporary Pacemaker;  Surgeon: Adrian Prows, MD;  Location: [facility];  Service: Cardiovascular;  Laterality: N/A;  . EP IMPLANTABLE DEVICE N/A 04/30/2015   Procedure: Pacemaker Implant;  Surgeon: Evans Lance, MD;  Location: [facility];  Service: Cardiovascular;  Laterality: N/A;  . LEFT AND RIGHT HEART CATHETERIZATION WITH CORONARY ANGIOGRAM N/A 09/30/2011   Procedure: LEFT AND RIGHT HEART CATHETERIZATION WITH CORONARY ANGIOGRAM;  Surgeon: Laverda Page, MD;  Location: [facility] C[facility];  Service: Cardiovascular;  Laterality: N/A;  . YAG LASER APPLICATION Right 07/28/9676   Procedure: YAG LASER APPLICATION;  Surgeon: Elta Guadeloupe T. Gershon Crane, MD;  Location: [facility];  Service: Ophthalmology;  Laterality: Right;  pt knows to arrive at 11:15  . YAG LASER APPLICATION Left 09/15/8099   Procedure: YAG  LASER APPLICATION;  Surgeon: Rutherford Guys, MD;  Location: [facility];  Service: Ophthalmology;  Laterality: Left;    Family History  Problem Relation Age of Onset  . Family history unknown: Yes   Social History:  reports that she has never smoked. She has never used smokeless tobacco. She reports that she does not drink alcohol or use drugs.  Allergies:  Allergies  Allergen Reactions  . Hydrocodone Other (See Comments)    lethargic  . Invokana [Canagliflozin] Other (See Comments)    Made heart race  . Oxycodone Other (See Comments)    lethargic     (Not in a hospital admission)  Results for orders placed or performed during the hospital encounter of 09/30/15 (from the past 48 hour(s))  Basic metabolic panel     Status: Abnormal   Collection Time: 09/30/15  9:48 AM  Result Value Ref Range   Sodium 135 135 - 145 mmol/L   Potassium 3.7 3.5 - 5.1 mmol/L   Chloride 97 (L) 101 - 111 mmol/L   CO2 26 22 - 32 mmol/L   Glucose, Bld 341 (H) 65 - 99 mg/dL   BUN 21 (H) 6 - 20 mg/dL   Creatinine, Ser 1.37 (H) 0.44 - 1.00 mg/dL   Calcium 9.8 8.9 - 10.3 mg/dL   GFR calc non Af Amer 38 (L) >60 mL/min   GFR calc Af Amer 43 (L) >60 mL/min    Comment: (NOTE) The eGFR has been calculated using the CKD  EPI equation. This calculation has not been validated in all clinical situations. eGFR's persistently <60 mL/min signify possible Chronic Kidney Disease.    Anion gap 12 5 - 15  CBC     Status: None   Collection Time: 09/30/15  9:48 AM  Result Value Ref Range   WBC 7.6 4.0 - 10.5 K/uL   RBC 4.54 3.87 - 5.11 MIL/uL   Hemoglobin 13.6 12.0 - 15.0 g/dL   HCT 41.9 36.0 - 46.0 %   MCV 92.3 78.0 - 100.0 fL   MCH 30.0 26.0 - 34.0 pg   MCHC 32.5 30.0 - 36.0 g/dL   RDW 13.9 11.5 - 15.5 %   Platelets 179 150 - 400 K/uL  Brain natriuretic peptide     Status: None   Collection Time: 09/30/15 10:00 AM  Result Value Ref Range   B Natriuretic Peptide 80.8 0.0 - 100.0 pg/mL  I-stat troponin, ED      Status: None   Collection Time: 09/30/15 10:02 AM  Result Value Ref Range   Troponin i, poc 0.01 0.00 - 0.08 ng/mL   Comment 3            Comment: Due to the release kinetics of cTnI, a negative result within the first hours of the onset of symptoms does not rule out myocardial infarction with certainty. If myocardial infarction is still suspected, repeat the test at appropriate intervals.   CBG monitoring, ED     Status: Abnormal   Collection Time: 09/30/15  3:58 PM  Result Value Ref Range   Glucose-Capillary 102 (H) 65 - 99 mg/dL  I-Stat Troponin, ED (not at [facility])     Status: None   Collection Time: 09/30/15  4:06 PM  Result Value Ref Range   Troponin i, poc 0.01 0.00 - 0.08 ng/mL   Comment 3            Comment: Due to the release kinetics of cTnI, a negative result within the first hours of the onset of symptoms does not rule out myocardial infarction with certainty. If myocardial infarction is still suspected, repeat the test at appropriate intervals.    Dg Chest 2 View  Result Date: 09/30/2015 CLINICAL DATA:  Heaviness and pressure in mid chest since this morning, tachycardia this morning, lightheadedness and weakness since last night, history atrial fibrillation, hypertension, diabetes mellitus, pacemaker EXAM: CHEST  2 VIEW COMPARISON:  05/01/2015 FINDINGS: LEFT subclavian transvenous pacemaker with leads projecting at RIGHT atrium and RIGHT ventricle. Enlargement of cardiac silhouette with pulmonary vascular congestion. Mediastinal contours normal. Eventration anterior RIGHT diaphragm unchanged. Mild bronchitic changes and minimal RIGHT basilar atelectasis. No acute infiltrate, pleural effusion or pneumothorax. Scattered degenerative disc and facet disease changes of the cervicothoracic spine. IMPRESSION: Enlargement of cardiac silhouette with pulmonary vascular congestion post pacemaker. Bronchitic changes with minimal RIGHT basilar atelectasis. Electronically Signed   By: Lavonia Dana M.D.   On: 09/30/2015 10:38    Review Of Systems Constitutional: Negative for chills and fever. Positive for fatigue. Respiratory: Positive for shortness of breath.  Cardiovascular: Positive for chest pain, palpitations and occasional leg edema. Gastrointestinal: Positive for nausea, abdominal pain. Genitourinary: Negative for hematuria or dysuria. Musculoskeletal: Negative for back pain. Skin: Negative for rash. Neurological: positive for lightheadedness. Negative for headaches. No stroke, seizures. Psychiatric: No admission for anxiety, depression or detox.  Blood pressure (!) 143/53, pulse 69, temperature 97.7 F (36.5 C), temperature source Oral, resp. rate 25, height 5' 2" (1.575 m), weight 90.3 kg (  199 lb 1.2 oz), SpO2 93 %. General appearance: alert, cooperative, appears stated age and mild distress Head: Normocephalic, atraumatic Eyes: conjunctivae/corneas clear. PERRL, EOM's intact. No scleral icterus. Neck: no adenopathy, no carotid bruit, no JVD, supple, symmetrical, trachea midline and thyroid not enlarged. Resp: clear to auscultation bilaterally. Minimal wheezing on cough. Cardio: regular rate and rhythm, S1, S2 normal, II/VI systolic murmur, no click, rub or gallop. GI: soft, non-tender; bowel sounds normal; no masses,  no organomegaly. Extremities: extremities normal, atraumatic, no cyanosis or edema Skin: Warm and dry. Neurologic: Alert and oriented X 3, normal strength and tone.   Assessment/Plan Chest pain r/o MI CAD DM, II, uncontrolled Hypertension Hyperlipidemia CKD, II S/P permanent pacemaker Junctional rhythm. H/O chronic left heart systolic failure  Place in observation. Resume metoprolol at 50 mg. twice daily. Consider nuclear stress test if troponin-I remain normal.  Birdie Riddle, MD  09/30/2015, 5:11 PM

## 2015-09-30 NOTE — ED Notes (Signed)
[employer] re-set pacemaker d/t new junctional rhythm.  Dr Doylene Canard aware.

## 2015-09-30 NOTE — ED Notes (Signed)
Nitro held.  Pt denies sob and chest pain at this time.

## 2015-09-30 NOTE — ED Triage Notes (Signed)
Per pT, Pt is coming from home with complaints of chest heaviness, SOB, and lightheadedness that started when she was trying to get dressed this morning. Pt is alert and oriented x 4 upon arrival. Denies any Vomiting, but reports some Nausea.

## 2015-09-30 NOTE — ED Notes (Signed)
Interrogated pacemaker 

## 2015-09-30 NOTE — ED Notes (Signed)
Pacemaker interrogated. 

## 2015-10-01 ENCOUNTER — Observation Stay (HOSPITAL_COMMUNITY): Payer: Commercial Managed Care - HMO

## 2015-10-01 LAB — CBC
HCT: 42.5 % (ref 36.0–46.0)
Hemoglobin: 13.3 g/dL (ref 12.0–15.0)
MCH: 29 pg (ref 26.0–34.0)
MCHC: 31.3 g/dL (ref 30.0–36.0)
MCV: 92.6 fL (ref 78.0–100.0)
PLATELETS: 168 10*3/uL (ref 150–400)
RBC: 4.59 MIL/uL (ref 3.87–5.11)
RDW: 13.9 % (ref 11.5–15.5)
WBC: 6.8 10*3/uL (ref 4.0–10.5)

## 2015-10-01 LAB — LIPID PANEL
CHOL/HDL RATIO: 8.1 ratio
Cholesterol: 171 mg/dL (ref 0–200)
HDL: 21 mg/dL — AB (ref >40)
LDL CALC: UNDETERMINED mg/dL (ref 0–99)
Triglycerides: 516 mg/dL — ABNORMAL HIGH (ref <150)
VLDL: UNDETERMINED mg/dL (ref 0–40)

## 2015-10-01 LAB — BASIC METABOLIC PANEL
Anion gap: 8 (ref 5–15)
BUN: 21 mg/dL — AB (ref 6–20)
CHLORIDE: 94 mmol/L — AB (ref 101–111)
CO2: 33 mmol/L — ABNORMAL HIGH (ref 22–32)
CREATININE: 1.27 mg/dL — AB (ref 0.44–1.00)
Calcium: 9.8 mg/dL (ref 8.9–10.3)
GFR, EST AFRICAN AMERICAN: 48 mL/min — AB (ref >60)
GFR, EST NON AFRICAN AMERICAN: 41 mL/min — AB (ref >60)
Glucose, Bld: 265 mg/dL — ABNORMAL HIGH (ref 65–99)
POTASSIUM: 4.4 mmol/L (ref 3.5–5.1)
SODIUM: 135 mmol/L (ref 135–145)

## 2015-10-01 LAB — HEMOGLOBIN A1C
HEMOGLOBIN A1C: 7.3 % — AB (ref 4.8–5.6)
Mean Plasma Glucose: 163 mg/dL

## 2015-10-01 LAB — TROPONIN I
Troponin I: <0.03 ng/mL (ref <0.03)
Troponin I: <0.03 ng/mL (ref <0.03)

## 2015-10-01 LAB — GLUCOSE, CAPILLARY
Glucose-Capillary: 157 mg/dL — ABNORMAL HIGH (ref 65–99)
Glucose-Capillary: 260 mg/dL — ABNORMAL HIGH (ref 65–99)
Glucose-Capillary: 129 mg/dL — ABNORMAL HIGH (ref 65–99)

## 2015-10-01 MED ORDER — AMIODARONE HCL 200 MG PO TABS: 100.0000 mg | ORAL_TABLET | Freq: Every day | ORAL | Status: DC

## 2015-10-01 MED ORDER — NITROGLYCERIN 0.4 MG SL SUBL: 0.4000 mg | SUBLINGUAL_TABLET | SUBLINGUAL | 1 refills | Status: DC | PRN

## 2015-10-01 MED ORDER — ISOSORBIDE MONONITRATE ER 30 MG PO TB24: 30.0000 mg | ORAL_TABLET | Freq: Every day | ORAL | Status: DC

## 2015-10-01 MED ORDER — METOPROLOL TARTRATE 50 MG PO TABS: 50.0000 mg | ORAL_TABLET | Freq: Two times a day (BID) | ORAL | 3 refills | Status: DC

## 2015-10-01 MED ORDER — REGADENOSON 0.4 MG/5ML IV SOLN
INTRAVENOUS | Status: AC
  Filled 2015-10-01: qty 5

## 2015-10-01 MED ORDER — REGADENOSON 0.4 MG/5ML IV SOLN
0.4000 mg | Freq: Once | INTRAVENOUS | Status: AC
  Administered 2015-10-01: 0.4 mg via INTRAVENOUS

## 2015-10-01 MED ORDER — TECHNETIUM TC 99M TETROFOSMIN IV KIT
30.0000 | PACK | Freq: Once | INTRAVENOUS | Status: AC | PRN
  Administered 2015-10-01: 30 via INTRAVENOUS

## 2015-10-01 MED ORDER — TECHNETIUM TC 99M TETROFOSMIN IV KIT
10.0000 | PACK | Freq: Once | INTRAVENOUS | Status: AC | PRN
  Administered 2015-10-01: 10 via INTRAVENOUS

## 2015-10-01 NOTE — Care Management Obs Status (Signed)
[facility] NOTIFICATION   Patient Details  Name: LASONYA OHAIRE MRN: [id number] Date of Birth: 1945/06/03   Medicare Observation Status Notification Given:  Yes    Bethena Roys, RN 10/01/2015, 11:33 AM

## 2015-10-01 NOTE — Discharge Summary (Signed)
Physician Discharge Summary  Patient ID: [name] MRN: [id number] DOB/AGE: 1946/01/13 70 y.o.  Admit date: 09/30/2015 Discharge date: 10/01/2015  Admission Diagnoses: Chest pain r/o MI CAD DM, II, uncontrolled Hypertnesion Hyperlipidemia CKD, II S/P permanent pacemaker Junctional rhythm H/O Chronic left heart systolic failure  Discharge Diagnoses:  Principle problem: * Chest pain* Active Problems:   Shortness of breath   CAD   DM, II uncontrolled with diabetic retinopathy   Hypertension   Hypertriglyceridemia   CKD II   S/P permanent pacemaker placement   Junctional rhythm   Obesity, excess calorie  Discharged Condition: fair  Hospital Course: 70 year old female ran out of metoprolol x 2 days and had shortness of breath, generalized fatigue and weakness with chest pain as heaviness. She had pacemaker interrogated and junctional rhythm with heart rate of 60 was found with short PR. Her baseline pacer rate was increased to 70 and AV delay was increased to 300 ms along with resuming beta-blocker at 50 mg. twice daily dose. She underwent nuclear stress test which was negative for reversible ischemia. She felt better with one dose of IV lasix and 1850 cc urine output. Diet and activity were discussed with patient promising to resume diet and activity. She will see her primary care in 2 weeks and Dr. Einar Gip in 1 month.  Consults: cardiology  Significant Diagnostic Studies: labs: Normal CBC. Near normal BMET except elevated blood sugar. Near normal Troponin-I.   EKG showed A paced rhythm with septal infarct.  Chest x-ray showed cardiac enlargement with pulmonary vascular congestion.   Gated nuclear stress test showed good LV systolic function and no reversible ischemia.  Treatments: cardiac meds: valsartan-HCTZ, Imdur, metoprolol, amiodarone, Apixaban, Nitroglycerin and lasix  Discharge Exam: Blood pressure (!) 146/51, pulse 70, temperature 98 F (36.7 C), temperature  source Oral, resp. rate 16, height 5\' 2"  (1.575 m), weight 89 kg (196 lb 4.8 oz), SpO2 97 %. General appearance: alert, cooperative, appears stated age and no distress Head: Normocephalic, atraumatic Eyes: conjunctivae/corneas clear. PERRL, EOM's intact.  Neck: no adenopathy, no carotid bruit, no JVD, supple, symmetrical, trachea midline and thyroid not enlarged. Resp: cear to auscultation bilaterally. No wheezing. Cardio: regular rate and rhythm, S1, S2 normal, II/VI systolic murmur, no click, rub or gallop. GI: soft, non-tender; bowel sounds normal; no masses,  no organomegaly Extremities: extremities normal, atraumatic, no cyanosis or edema Skin: Warm and dry. Neurologic: Alert and oriented X 3, normal strength and tone.   Disposition: 01-Home or Self Care     Medication List    TAKE these medications   amiodarone 200 MG tablet Commonly known as:  PACERONE Take 0.5 tablets (100 mg total) by mouth daily.   apixaban 5 MG Tabs tablet Commonly known as:  ELIQUIS Take 1 tablet (5 mg total) by mouth 2 (two) times daily.   cholecalciferol 1000 units tablet Commonly known as:  VITAMIN D Take 2,000 Units by mouth 2 (two) times daily.   fenofibrate 160 MG tablet Take 160 mg by mouth daily with supper.   Fish Oil 1000 MG Caps Take 1,000 mg by mouth 3 (three) times daily.   furosemide 40 MG tablet Commonly known as:  LASIX Take 40 mg by mouth daily.   insulin NPH-regular Human (70-30) 100 UNIT/ML injection Commonly known as:  NOVOLIN 70/30 Inject 35-75 Units into the skin 2 (two) times daily with a meal. Inject 75 units subcutaneously with breakfast and 35 units with supper   isosorbide mononitrate 30 MG 24  hr tablet Commonly known as:  IMDUR Take 1 tablet (30 mg total) by mouth at bedtime.   levothyroxine 50 MCG tablet Commonly known as:  SYNTHROID, LEVOTHROID Take 50 mcg by mouth at bedtime.   metFORMIN 500 MG tablet Commonly known as:  GLUCOPHAGE Take 1,000 mg by  mouth 2 (two) times daily with a meal.   metoprolol 50 MG tablet Commonly known as:  LOPRESSOR Take 1 tablet (50 mg total) by mouth 2 (two) times daily. What changed:  medication strength  how much to take   nitroGLYCERIN 0.4 MG SL tablet Commonly known as:  NITROSTAT Place 1 tablet (0.4 mg total) under the tongue every 5 (five) minutes as needed for chest pain.   pravastatin 40 MG tablet Commonly known as:  PRAVACHOL Take 40 mg by mouth at bedtime.   valsartan-hydrochlorothiazide 320-12.5 MG tablet Commonly known as:  DIOVAN-HCT Take 1 tablet by mouth daily.      Follow-up Information    MACKENZIE,BRIAN, MD Follow up in 2 week(s).   Specialty:  Internal Medicine Contact information: [place], [place] [address] [phone]        Adrian Prows, MD Follow up in 1 month(s).   Specialty:  Cardiology Contact information: [address] [place] [address] [phone]           Signed: Birdie Riddle 10/01/2015, 5:17 PM

## 2015-10-18 ENCOUNTER — Encounter (HOSPITAL_COMMUNITY): Payer: Self-pay | Admitting: *Deleted

## 2015-10-18 ENCOUNTER — Emergency Department (HOSPITAL_BASED_OUTPATIENT_CLINIC_OR_DEPARTMENT_OTHER)
Admit: 2015-10-18 | Discharge: 2015-10-18 | Disposition: A | Discharge status: Home / Self Care | Payer: Commercial Managed Care - HMO | Attending: Emergency Medicine | Admitting: Emergency Medicine

## 2015-10-18 ENCOUNTER — Emergency Department (HOSPITAL_COMMUNITY)
Admission: EM | Admit: 2015-10-18 | Discharge: 2015-10-18 | Disposition: A | Discharge status: Home / Self Care | Payer: Commercial Managed Care - HMO | Attending: Emergency Medicine | Admitting: Emergency Medicine

## 2015-10-18 ENCOUNTER — Emergency Department (HOSPITAL_COMMUNITY): Payer: Commercial Managed Care - HMO

## 2015-10-18 DIAGNOSIS — E1140 Type 2 diabetes mellitus with diabetic neuropathy, unspecified: Secondary | ICD-10-CM | POA: Diagnosis not present

## 2015-10-18 DIAGNOSIS — E1122 Type 2 diabetes mellitus with diabetic chronic kidney disease: Secondary | ICD-10-CM | POA: Insufficient documentation

## 2015-10-18 DIAGNOSIS — I5032 Chronic diastolic (congestive) heart failure: Secondary | ICD-10-CM | POA: Insufficient documentation

## 2015-10-18 DIAGNOSIS — Z7901 Long term (current) use of anticoagulants: Secondary | ICD-10-CM | POA: Insufficient documentation

## 2015-10-18 DIAGNOSIS — N289 Disorder of kidney and ureter, unspecified: Secondary | ICD-10-CM

## 2015-10-18 DIAGNOSIS — Z950 Presence of cardiac pacemaker: Secondary | ICD-10-CM | POA: Diagnosis not present

## 2015-10-18 DIAGNOSIS — R0789 Other chest pain: Secondary | ICD-10-CM | POA: Diagnosis present

## 2015-10-18 DIAGNOSIS — M7989 Other specified soft tissue disorders: Secondary | ICD-10-CM

## 2015-10-18 DIAGNOSIS — I130 Hypertensive heart and chronic kidney disease with heart failure and stage 1 through stage 4 chronic kidney disease, or unspecified chronic kidney disease: Secondary | ICD-10-CM | POA: Insufficient documentation

## 2015-10-18 DIAGNOSIS — I2510 Atherosclerotic heart disease of native coronary artery without angina pectoris: Secondary | ICD-10-CM | POA: Insufficient documentation

## 2015-10-18 DIAGNOSIS — R2242 Localized swelling, mass and lump, left lower limb: Secondary | ICD-10-CM | POA: Diagnosis not present

## 2015-10-18 DIAGNOSIS — Z794 Long term (current) use of insulin: Secondary | ICD-10-CM | POA: Insufficient documentation

## 2015-10-18 DIAGNOSIS — N183 Chronic kidney disease, stage 3 (moderate): Secondary | ICD-10-CM | POA: Insufficient documentation

## 2015-10-18 DIAGNOSIS — R079 Chest pain, unspecified: Secondary | ICD-10-CM

## 2015-10-18 LAB — BASIC METABOLIC PANEL
Anion gap: 14 (ref 5–15)
BUN: 15 mg/dL (ref 6–20)
CALCIUM: 10.3 mg/dL (ref 8.9–10.3)
CHLORIDE: 94 mmol/L — AB (ref 101–111)
CO2: 27 mmol/L (ref 22–32)
CREATININE: 1.16 mg/dL — AB (ref 0.44–1.00)
GFR calc non Af Amer: 46 mL/min — ABNORMAL LOW (ref >60)
GFR, EST AFRICAN AMERICAN: 53 mL/min — AB (ref >60)
GLUCOSE: 213 mg/dL — AB (ref 65–99)
Potassium: 3.6 mmol/L (ref 3.5–5.1)
Sodium: 135 mmol/L (ref 135–145)

## 2015-10-18 LAB — CBC
HCT: 42.2 % (ref 36.0–46.0)
Hemoglobin: 13.7 g/dL (ref 12.0–15.0)
MCH: 29.6 pg (ref 26.0–34.0)
MCHC: 32.5 g/dL (ref 30.0–36.0)
MCV: 91.1 fL (ref 78.0–100.0)
PLATELETS: 186 10*3/uL (ref 150–400)
RBC: 4.63 MIL/uL (ref 3.87–5.11)
RDW: 13.9 % (ref 11.5–15.5)
WBC: 8.4 10*3/uL (ref 4.0–10.5)

## 2015-10-18 LAB — I-STAT TROPONIN, ED
TROPONIN I, POC: 0.02 ng/mL (ref 0.00–0.08)
Troponin i, poc: 0.01 ng/mL (ref 0.00–0.08)

## 2015-10-18 LAB — TROPONIN I

## 2015-10-18 LAB — BRAIN NATRIURETIC PEPTIDE: B NATRIURETIC PEPTIDE 5: 134.1 pg/mL — AB (ref 0.0–100.0)

## 2015-10-18 LAB — D-DIMER, QUANTITATIVE: D-Dimer, Quant: 0.29 ug/mL-FEU (ref 0.00–0.50)

## 2015-10-18 MED ORDER — FUROSEMIDE 10 MG/ML IJ SOLN
40.0000 mg | Freq: Once | INTRAMUSCULAR | Status: AC
  Administered 2015-10-18: 40 mg via INTRAVENOUS
  Filled 2015-10-18: qty 4

## 2015-10-18 MED ORDER — ASPIRIN 81 MG PO CHEW
324.0000 mg | CHEWABLE_TABLET | Freq: Once | ORAL | Status: AC
  Administered 2015-10-18: 324 mg via ORAL
  Filled 2015-10-18: qty 4

## 2015-10-18 MED ORDER — POTASSIUM CHLORIDE CRYS ER 20 MEQ PO TBCR
20.0000 meq | EXTENDED_RELEASE_TABLET | Freq: Once | ORAL | Status: AC
  Administered 2015-10-18: 20 meq via ORAL
  Filled 2015-10-18: qty 1

## 2015-10-18 NOTE — ED Notes (Signed)
Patient reported about 1030pm she got pressure in the central chest that radiated up to her neck.  +nausea at the time.  Stated she waited until about 1215 and took 1 NTG  Stated the pain started to ease up but stated it scared her since that happened before her pacemaker placement

## 2015-10-18 NOTE — ED Notes (Signed)
cardiology AT BEDSIDE.

## 2015-10-18 NOTE — ED Notes (Signed)
MD delo made aware pt's repeat trop negative and per cardiology pt could be d/c'ed. States he will page cardiology.

## 2015-10-18 NOTE — ED Notes (Signed)
Pt instructed to use call bell if she needs to get up to go to restroom.

## 2015-10-18 NOTE — ED Notes (Signed)
Received call back from [employer] - reports is no arrhthymias detected, battery is in acceptable range and is functioning as programmed  Dr Roxanne Mins notified

## 2015-10-18 NOTE — ED Notes (Signed)
Oxygen 2l via [place] applied due to sats dropping when she sleeps.  States she uses a C PAP at home

## 2015-10-18 NOTE — H&P (Signed)
[name] is an 70 y.o. female.   Chief Complaint: Chest Pain HPI: [name]  is a 70 y.o. female with a history of uncontrolled diabetes with CKD stage III, mixed hyperlipidemia, hypertension, moderate obesity and obstructive sleep apnea.  She also has known mild 50-60% stenosis in the circumflex coronary artery by coronary angiography in September 2013, has chronic diastolic heart failure.  She was admitted to [facility] on 04/29/2015 and discharged on 05/02/2015 with sick sinus syndrome, bradycardia-tachycardia syndrome. She underwent permanent dual chamber transvenous pacemaker implantation with Medtronic MRI compatible lead, regular Medtronic Adapta pacemaker by Dr. Crissie Sickles on 04/30/2015.    She reports ongoing chest pain for approximately 4 hours since last night while she was at church.  She took 3 sublingual nitroglycerin with partial relief. She states that it was in the form of chest tightness with radiation to her neck. She states that she felt pounding Sensation in her chest palpitations, Thought that she may be in atrial fibrillation.  She is also been noticing gradual worsening of dyspnea with heart racing and lower extremity edema That started couple days ago.  No ischemic changes on EKG and initial troponin was negative.  Past Medical History:  Diagnosis Date  . CHF (congestive heart failure) ([place])   . Diabetes mellitus without complication ([place])   . Hyperlipemia   . Hypertension   . Mitral valve disorders(424.0)   . Obstructive sleep apnea   . Peripheral neuropathy ([place])   . Renal disorder Kidney disease stage2  . Thyroid disease hypothyroid  . Vitamin D deficiency     Past Surgical History:  Procedure Laterality Date  . CARDIAC CATHETERIZATION N/A 04/29/2015   Procedure: Temporary Pacemaker;  Surgeon: Adrian Prows, MD;  Location: [place] [facility];  Service: Cardiovascular;  Laterality: N/A;  . EP IMPLANTABLE DEVICE N/A 04/30/2015   Procedure:  Pacemaker Implant;  Surgeon: Evans Lance, MD;  Location: [place] [facility];  Service: Cardiovascular;  Laterality: N/A;  . LEFT AND RIGHT HEART CATHETERIZATION WITH CORONARY ANGIOGRAM N/A 09/30/2011   Procedure: LEFT AND RIGHT HEART CATHETERIZATION WITH CORONARY ANGIOGRAM;  Surgeon: Laverda Page, MD;  Location: [facility];  Service: Cardiovascular;  Laterality: N/A;  . YAG LASER APPLICATION Right 1/58/3094   Procedure: YAG LASER APPLICATION;  Surgeon: Elta Guadeloupe T. Gershon Crane, MD;  Location: [facility];  Service: Ophthalmology;  Laterality: Right;  pt knows to arrive at 11:15  . YAG LASER APPLICATION Left 0/07/6806   Procedure: YAG LASER APPLICATION;  Surgeon: Rutherford Guys, MD;  Location: [facility];  Service: Ophthalmology;  Laterality: Left;    Family History  Problem Relation Age of Onset  . Family history unknown: Yes   Social History:  reports that she has never smoked. She has never used smokeless tobacco. She reports that she does not drink alcohol or use drugs.  Allergies:  Allergies  Allergen Reactions  . Hydrocodone Other (See Comments)    lethargic  . Invokana [Canagliflozin] Other (See Comments)    Made heart race  . Oxycodone Other (See Comments)    lethargic   Review of Systems - General ROS: negative for - fatigue, fever, malaise, night sweats or weight gain Respiratory ROS: positive for - shortness of breath negative for - hemoptysis or orthopnea Cardiovascular ROS: positive for - chest pain and rapid heart rate negative for - orthopnea or paroxysmal nocturnal dyspnea Gastrointestinal ROS: no abdominal pain, change in bowel habits, or black or bloody stools Neurological ROS:  no TIA or stroke symptoms  Blood pressure 99/68, pulse 78, temperature 97.9 F (36.6 C), temperature source Oral, resp. rate 17, height _0  (1.575 m), weight 88.9 kg (196 lb), SpO2 95 %. Body mass index is 35.85 kg/m.   General appearance: alert, cooperative, appears stated age, no distress and  moderately obese Eyes: negative findings: lids and lashes normal and wears glasses. Neck: no carotid bruit, no JVD, supple, symmetrical, trachea midline and thyroid not enlarged, symmetric, no tenderness/mass/nodules Resp: clear to auscultation bilaterally Chest wall: no tenderness Cardio: regular rate and rhythm, S1, S2 normal, no murmur, click, rub or gallop GI: soft, non-tender; bowel sounds normal; no masses,  no organomegaly and obese Extremities: extremities normal, atraumatic, no cyanosis or edema Pulses: 2+ and symmetric Skin: Skin color, texture, turgor normal. No rashes or lesions Neurologic: Grossly normal  Results for orders placed or performed during the hospital encounter of 10/18/15 (from the past 48 hour(s))  Basic metabolic panel     Status: Abnormal   Collection Time: 10/18/15  2:27 AM  Result Value Ref Range   Sodium 135 135 - 145 mmol/L   Potassium 3.6 3.5 - 5.1 mmol/L   Chloride 94 (L) 101 - 111 mmol/L   CO2 27 22 - 32 mmol/L   Glucose, Bld 213 (H) 65 - 99 mg/dL   BUN 15 6 - 20 mg/dL   Creatinine, Ser 1.16 (H) 0.44 - 1.00 mg/dL   Calcium 10.3 8.9 - 10.3 mg/dL   GFR calc non Af Amer 46 (L) >60 mL/min   GFR calc Af Amer 53 (L) >60 mL/min    Comment: (NOTE) The eGFR has been calculated using the CKD EPI equation. This calculation has not been validated in all clinical situations. eGFR's persistently <60 mL/min signify possible Chronic Kidney Disease.    Anion gap 14 5 - 15  CBC     Status: None   Collection Time: 10/18/15  2:27 AM  Result Value Ref Range   WBC 8.4 4.0 - 10.5 K/uL   RBC 4.63 3.87 - 5.11 MIL/uL   Hemoglobin 13.7 12.0 - 15.0 g/dL   HCT 42.2 36.0 - 46.0 %   MCV 91.1 78.0 - 100.0 fL   MCH 29.6 26.0 - 34.0 pg   MCHC 32.5 30.0 - 36.0 g/dL   RDW 13.9 11.5 - 15.5 %   Platelets 186 150 - 400 K/uL  D-dimer, quantitative (not at [facility])     Status: None   Collection Time: 10/18/15  2:27 AM  Result Value Ref Range   D-Dimer, Quant 0.29 0.00 - 0.50  ug/mL-FEU    Comment: (NOTE) At the manufacturer cut-off of 0.50 ug/mL FEU, this assay has been documented to exclude PE with a sensitivity and negative predictive value of 97 to 99%.  At this time, this assay has not been approved by the FDA to exclude DVT/VTE. Results should be correlated with clinical presentation.   Brain natriuretic peptide     Status: Abnormal   Collection Time: 10/18/15  2:27 AM  Result Value Ref Range   B Natriuretic Peptide 134.1 (H) 0.0 - 100.0 pg/mL  I-stat troponin, ED     Status: None   Collection Time: 10/18/15  2:32 AM  Result Value Ref Range   Troponin i, poc 0.01 0.00 - 0.08 ng/mL   Comment 3            Comment: Due to the release kinetics of cTnI, a negative result within the first hours of  the onset of symptoms does not rule out myocardial infarction with certainty. If myocardial infarction is still suspected, repeat the test at appropriate intervals.   I-stat troponin, ED     Status: None   Collection Time: 10/18/15  5:28 AM  Result Value Ref Range   Troponin i, poc 0.02 0.00 - 0.08 ng/mL   Comment 3            Comment: Due to the release kinetics of cTnI, a negative result within the first hours of the onset of symptoms does not rule out myocardial infarction with certainty. If myocardial infarction is still suspected, repeat the test at appropriate intervals.   Troponin I     Status: None   Collection Time: 10/18/15  9:30 AM  Result Value Ref Range   Troponin I <0.03 <0.03 ng/mL   Dg Chest 2 View  Result Date: 10/18/2015 CLINICAL DATA:  Upper chest pressure and shortness of breath tonight. Ankle swelling. History of hypertension, hyperlipidemia, CHF and diabetes. EXAM: CHEST  2 VIEW COMPARISON:  Chest radiograph September 30, 2015 FINDINGS: Cardiac silhouette is mildly enlarged, mediastinal silhouette is unremarkable. Elevated RIGHT hemidiaphragm. Chronic bronchitic changes without pleural effusion or focal consolidation. No  pneumothorax. Kerley B-lines and mild interstitial prominence compatible with chronic CHF. No pleural effusion or focal consolidation. Strandy densities bilateral lung bases. No pneumothorax. Dual lead LEFT cardiac pacemaker in situ. Soft tissue planes and included osseous structure nonsuspicious. Calcification the neck are likely vascular. IMPRESSION: Mild cardiomegaly and pulmonary edema. Mild chronic bronchitic changes and bibasilar atelectasis/scarring. Electronically Signed   By: Elon Alas M.D.   On: 10/18/2015 03:53    Labs:   Lab Results  Component Value Date   WBC 8.4 10/18/2015   HGB 13.7 10/18/2015   HCT 42.2 10/18/2015   MCV 91.1 10/18/2015   PLT 186 10/18/2015    Recent Labs Lab 10/18/15 0227  NA 135  K 3.6  CL 94*  CO2 27  BUN 15  CREATININE 1.16*  CALCIUM 10.3  GLUCOSE 213*    Lipid Panel     Component Value Date/Time   CHOL 171 10/01/2015 0021   TRIG 516 (H) 10/01/2015 0021   HDL 21 (L) 10/01/2015 0021   CHOLHDL 8.1 10/01/2015 0021   VLDL UNABLE TO CALCULATE IF TRIGLYCERIDE OVER 400 mg/dL 10/01/2015 0021   LDLCALC UNABLE TO CALCULATE IF TRIGLYCERIDE OVER 400 mg/dL 10/01/2015 0021    BNP (last 3 results)  Recent Labs  04/29/15 1521 09/30/15 1000 10/18/15 0227  BNP 198.8* 80.8 134.1*    HEMOGLOBIN A1C Lab Results  Component Value Date   HGBA1C 7.3 (H) 09/30/2015   MPG 163 09/30/2015    Cardiac Panel (last 3 results)  Recent Labs  10/01/15 0021 10/01/15 1031 10/18/15 0930  TROPONINI <0.03 <0.03 <0.03    Recent Labs  04/29/15 1752 04/29/15 1903 09/30/15 1809  TSH 1.145 1.050 3.511    EKG: 10/17/2015: A paced rhythm with first-degree AV block, no ST-T wave changes of ischemia or infarct.  No change from prior EKG.  .No current facility-administered medications for this encounter.   Current Outpatient Prescriptions:  .  amiodarone (PACERONE) 200 MG tablet, Take 0.5 tablets (100 mg total) by mouth daily., Disp: , Rfl:  .   apixaban (ELIQUIS) 5 MG TABS tablet, Take 1 tablet (5 mg total) by mouth 2 (two) times daily., Disp: 60 tablet, Rfl: 3 .  cholecalciferol (VITAMIN D) 1000 UNITS tablet, Take 2,000 Units by mouth 2 (two) times  daily. , Disp: , Rfl:  .  fenofibrate 160 MG tablet, Take 160 mg by mouth daily with supper. , Disp: , Rfl:  .  furosemide (LASIX) 40 MG tablet, Take 40 mg by mouth daily., Disp: , Rfl:  .  insulin NPH-regular Human (NOVOLIN 70/30) (70-30) 100 UNIT/ML injection, Inject 35-75 Units into the skin 2 (two) times daily with a meal. Inject 75 units subcutaneously with breakfast and 35 units with supper, Disp: , Rfl:  .  isosorbide mononitrate (IMDUR) 30 MG 24 hr tablet, Take 1 tablet (30 mg total) by mouth at bedtime., Disp: , Rfl:  .  levothyroxine (SYNTHROID, LEVOTHROID) 50 MCG tablet, Take 50 mcg by mouth at bedtime. , Disp: , Rfl:  .  metFORMIN (GLUCOPHAGE) 500 MG tablet, Take 1,000 mg by mouth 2 (two) times daily with a meal., Disp: , Rfl:  .  metoprolol tartrate (LOPRESSOR) 50 MG tablet, Take 1 tablet (50 mg total) by mouth 2 (two) times daily., Disp: 60 tablet, Rfl: 3 .  nitroGLYCERIN (NITROSTAT) 0.4 MG SL tablet, Place 1 tablet (0.4 mg total) under the tongue every 5 (five) minutes as needed for chest pain., Disp: 25 tablet, Rfl: 1 .  Omega-3 Fatty Acids (FISH OIL) 1000 MG CAPS, Take 1,000 mg by mouth 3 (three) times daily., Disp: , Rfl:  .  pravastatin (PRAVACHOL) 40 MG tablet, Take 40 mg by mouth at bedtime. , Disp: , Rfl:  .  valsartan-hydrochlorothiazide (DIOVAN-HCT) 320-12.5 MG per tablet, Take 1 tablet by mouth daily., Disp: , Rfl:   Assessment/Plan 1. Chest pain suggestive of angina pectoris, no EKG changes of ischemia, negative troponin in spite of .4 hours of chest pain, Symptoms completely resolved with diuresis and patient ablated in the hallway with no recurrence of chest pain.  2. Acute on Chronic diastolic heart failure 3. Sick sinus syndrome and paroxysmal episodes of atrial  fibrillation, tachycardia/bradycardia syndrome. S/P PTVP Medtronic MRI compatible leads, regular pacer, Medtronic Adapta Dr Peggyann Juba 04/30/2015. 4. PAF (CHA2DS2-VASc Score is 5 with yearly risk of stroke of 6.7%. HAS-Bled score is 1 and estimated major bleeding in one year is 1.02-1.5%) 4. Chronic stage III a kidney disease due to diabetes mellitus 5. Diabetes mellitus type 2 uncontrolled 6.  Moderate obesity 7.  Mixed hyperlipidemia with marked elevation triglycerides.  Recommendation: She presents for chest pain, atypical in presentation, however cannot exclude angina, particularly given multiple risk factors including uncontrolled diabetes, hyperlipidemia, and hypertension.  Despite ongoing chest pain for approximately 4 hours, EKG did not reveal any evidence of ischemia and serial cardiac enzymes remained negative.    BNP elevated and symptoms resolved with one dose of IV furosemide.  Suspect component of acute on chronic diastolic heart failure.  She has since ambulated without any exertional chest pain or dyspnea.  From a cardiac standpoint, she is safe to be discharged home and we will see her back in the office at 9:45 Monday morning.  She was advised to have a very low threshold to call us or return to the emergency department for any recurrent or worsening symptoms. She does have sublingual nitroglycerin to use at home, she'll report any use it was immediately including on the weekend.  She is advised to avoid excessive salt and calorie restriction was discussed in detail again to the patient. If patient has recurrence of chest pain, I'll have a low threshold to perform cardiac catheterization. Dietary compliance has been an issue all along.  Adrian Prows, MD 10/18/2015, 11:15 AM [facility].  [place] Pager: [phone] Office: [phone] If no answer: Cell:  [phone]

## 2015-10-18 NOTE — ED Notes (Signed)
Pt ambulated down hallway and around pod approximately 200 yards. Pt tolerated well. No chest pain or shortness of breath. Pt is back in bed at this time placed back onto cardiac monitor.

## 2015-10-18 NOTE — ED Notes (Signed)
Patient am,bulated to the BR and voided without difficulty

## 2015-10-18 NOTE — Progress Notes (Signed)
Preliminary results by tech - Venous Duplex Lower Ext. Left Completed. Negative for deep and superficial vein thrombosis in the left leg. Pernella Ackerley, BS, RDMS, RVT  

## 2015-10-18 NOTE — ED Notes (Signed)
Pt has received discharge papers and is currently calling granddaughter to obtain a ride.

## 2015-10-18 NOTE — ED Notes (Signed)
Pt finally able to contact grand daughter and taken by this RN to front of eD entrance and sat in chair in view on nurse first to wait for granddaughter to arrive.

## 2015-10-18 NOTE — ED Notes (Signed)
Pt arrives back from [place]. Pt is resting with eyes closed in bed at this time.

## 2015-10-18 NOTE — ED Notes (Signed)
Pt ambulated to restroom with stand by assistance only.

## 2015-10-18 NOTE — Discharge Instructions (Signed)
Follow-up with Dr. Einar Gip on Monday as recommended.  Return to the emergency department in the meantime if your symptoms significantly worsen or change.

## 2015-10-18 NOTE — ED Notes (Signed)
MD delo made aware of discharge order placed by cardiology PA. No discharge papers at this time. DR.Delo would like to speak to cardiology before d/c.

## 2015-10-18 NOTE — ED Notes (Signed)
Pt requests to sit on the side of the bed with side rail down.

## 2015-10-18 NOTE — ED Triage Notes (Signed)
Pt reports central chest pain starting around 2200 last night. Pt took nitro x 3 around 0100 this morning. Pt's chest pain is relieved. Pt states she feels "shaky" and feels like her blood pressure is up.

## 2015-10-18 NOTE — ED Provider Notes (Signed)
Care assumed from Dr. Roxanne Mins at shift change. Patient initially presented here with chest pain. Troponin 3 have all been negative and EKG is unchanged. Patient was seen by her cardiologist while in the ED who is comfortable with discharge. He will follow her up in the office on Monday. Patient had no further chest pain while in the emergency department. She appears very comfortable and vital signs are stable at discharge.   Veryl Speak, MD 10/18/15 [phone]

## 2015-10-18 NOTE — ED Provider Notes (Signed)
[facility] DEPT Provider Note   CSN: [id number] Arrival date & time: 10/18/15  0215     History   Chief Complaint Chief Complaint  Patient presents with  . Chest Pain    HPI [name] is a 70 y.o. female. She has history of diabetes, hypertension, hyperlipidemia, congestive heart failure. During the day yesterday, she noted some asymmetric swelling of her legs with the left leg being more swollen than the right. This evening, while at rest, she noted a sense that her heart was pounding in her chest. This was associated with some dyspnea, nausea, and a sense of heaviness in her chest. There was no diaphoresis. She took 3 nitroglycerin which did eventually relieve her discomfort completely. Also, she did check her heart rate while she felt her heart pounding, and it was normal. She had a pacemaker implanted 6 months ago. She denies any recent surgery or long distance travel or history of DVT.  The history is provided by the patient.  Chest Pain      Past Medical History:  Diagnosis Date  . CHF (congestive heart failure) ([place])   . Diabetes mellitus without complication ([place])   . Hyperlipemia   . Hypertension   . Mitral valve disorders(424.0)   . Obstructive sleep apnea   . Peripheral neuropathy ([place])   . Renal disorder Kidney disease stage2  . Thyroid disease hypothyroid  . Vitamin D deficiency     Patient Active Problem List   Diagnosis Date Noted  . Shortness of breath 09/30/2015  . Atrial fibrillation with RVR ([place]) 04/29/2015  . Tachycardia-bradycardia syndrome ([place]) 04/29/2015  . Complex sleep apnea syndrome 04/29/2014  . CAD (coronary artery disease) 04/29/2014  . Moderate mitral regurgitation 04/29/2014  . Diabetes mellitus ([place]) 06/02/2012  . Nausea alone 06/01/2012  . Respiratory failure ([place]) 05/30/2012  . Acute diastolic heart failure ([place]) 05/30/2012  . Acute on chronic renal failure ([place]) 05/30/2012  . Leukocytosis 05/30/2012  . OBESITY  08/02/2008  . ENTERITIS 08/02/2008  . DIVERTICULOSIS OF COLON 08/02/2008  . HEMOCCULT POSITIVE STOOL 08/02/2008  . NAUSEA AND VOMITING 08/02/2008  . DIARRHEA 08/02/2008  . ABDOMINAL PAIN 08/02/2008  . FEVER, HX OF 08/02/2008    Past Surgical History:  Procedure Laterality Date  . CARDIAC CATHETERIZATION N/A 04/29/2015   Procedure: Temporary Pacemaker;  Surgeon: Adrian Prows, MD;  Location: [facility];  Service: Cardiovascular;  Laterality: N/A;  . EP IMPLANTABLE DEVICE N/A 04/30/2015   Procedure: Pacemaker Implant;  Surgeon: Evans Lance, MD;  Location: [facility];  Service: Cardiovascular;  Laterality: N/A;  . LEFT AND RIGHT HEART CATHETERIZATION WITH CORONARY ANGIOGRAM N/A 09/30/2011   Procedure: LEFT AND RIGHT HEART CATHETERIZATION WITH CORONARY ANGIOGRAM;  Surgeon: Laverda Page, MD;  Location: [facility] CATH LAB;  Service: Cardiovascular;  Laterality: N/A;  . YAG LASER APPLICATION Right [id number]   Procedure: YAG LASER APPLICATION;  Surgeon: Elta Guadeloupe T. Gershon Crane, MD;  Location: [facility];  Service: Ophthalmology;  Laterality: Right;  pt knows to arrive at 11:15  . YAG LASER APPLICATION Left [id number]   Procedure: YAG LASER APPLICATION;  Surgeon: Rutherford Guys, MD;  Location: [facility];  Service: Ophthalmology;  Laterality: Left;    OB History    No data available       Home Medications    Prior to Admission medications   Medication Sig Start Date End Date Taking? Authorizing Provider  amiodarone (PACERONE) 200 MG tablet Take 0.5 tablets (100 mg total) by mouth  daily. 10/01/15   Dixie Dials, MD  apixaban (ELIQUIS) 5 MG TABS tablet Take 1 tablet (5 mg total) by mouth 2 (two) times daily. 05/02/15   Ripudeep Krystal Eaton, MD  cholecalciferol (VITAMIN D) 1000 UNITS tablet Take 2,000 Units by mouth 2 (two) times daily.     Historical Provider, MD  fenofibrate 160 MG tablet Take 160 mg by mouth daily with supper.     Historical Provider, MD  furosemide (LASIX) 40 MG tablet Take 40 mg by  mouth daily.    Historical Provider, MD  insulin NPH-regular Human (NOVOLIN 70/30) (70-30) 100 UNIT/ML injection Inject 35-75 Units into the skin 2 (two) times daily with a meal. Inject 75 units subcutaneously with breakfast and 35 units with supper    Historical Provider, MD  isosorbide mononitrate (IMDUR) 30 MG 24 hr tablet Take 1 tablet (30 mg total) by mouth at bedtime. 10/01/15   Dixie Dials, MD  levothyroxine (SYNTHROID, LEVOTHROID) 50 MCG tablet Take 50 mcg by mouth at bedtime.     Historical Provider, MD  metFORMIN (GLUCOPHAGE) 500 MG tablet Take 1,000 mg by mouth 2 (two) times daily with a meal.    Historical Provider, MD  metoprolol tartrate (LOPRESSOR) 50 MG tablet Take 1 tablet (50 mg total) by mouth 2 (two) times daily. 10/01/15   Dixie Dials, MD  nitroGLYCERIN (NITROSTAT) 0.4 MG SL tablet Place 1 tablet (0.4 mg total) under the tongue every 5 (five) minutes as needed for chest pain. 10/01/15   Dixie Dials, MD  Omega-3 Fatty Acids (FISH OIL) 1000 MG CAPS Take 1,000 mg by mouth 3 (three) times daily.    Historical Provider, MD  pravastatin (PRAVACHOL) 40 MG tablet Take 40 mg by mouth at bedtime.     Historical Provider, MD  valsartan-hydrochlorothiazide (DIOVAN-HCT) 320-12.5 MG per tablet Take 1 tablet by mouth daily.    Historical Provider, MD    Family History Family History  Problem Relation Age of Onset  . Family history unknown: Yes    Social History Social History  Substance Use Topics  . Smoking status: Never Smoker  . Smokeless tobacco: Never Used  . Alcohol use No     Allergies   Hydrocodone; Invokana [canagliflozin]; and Oxycodone   Review of Systems Review of Systems  Cardiovascular: Positive for chest pain.  All other systems reviewed and are negative.    Physical Exam Updated Vital Signs BP (!) 126/108 (BP Location: Left Arm)   Pulse 78   Temp 97.9 F (36.6 C) (Oral)   Resp 18   Ht 5\' 2"  (1.575 m)   Wt 196 lb (88.9 kg)   SpO2 96%   BMI 35.85  kg/m   Physical Exam  Nursing note and vitals reviewed.  70 year old female, resting comfortably and in no acute distress. Vital signs are significant for diastolic hypertension. Oxygen saturation is 96%, which is normal. Head is normocephalic and atraumatic. PERRLA, EOMI. Oropharynx is clear. Neck is nontender and supple without adenopathy or JVD. Back is nontender and there is no CVA tenderness. Lungs are clear without rales, wheezes, or rhonchi. Chest is nontender. Pacemaker present left subclavian area. Heart has regular rate and rhythm without murmur. Abdomen is soft, flat, nontender without masses or hepatosplenomegaly and peristalsis is normoactive. Extremities have 1+ edema, full range of motion is present. Left calf is is more swollen the right calf with Circumference approximately 1 cm greater. Skin is warm and dry without rash. Neurologic: Mental status is normal, cranial nerves  are intact, there are no motor or sensory deficits.  ED Treatments / Results  Labs (all labs ordered are listed, but only abnormal results are displayed) Labs Reviewed  BASIC METABOLIC PANEL - Abnormal; Notable for the following:       Result Value   Chloride 94 (*)    Glucose, Bld 213 (*)    Creatinine, Ser 1.16 (*)    GFR calc non Af Amer 46 (*)    GFR calc Af Amer 53 (*)    All other components within normal limits  CBC  D-DIMER, QUANTITATIVE (NOT AT [facility])  BRAIN NATRIURETIC PEPTIDE  I-STAT TROPOININ, ED  I-STAT TROPOININ, ED    EKG  EKG Interpretation  Date/Time:  Thursday October 18 2015 02:21:13 EDT Ventricular Rate:  76 PR Interval:  228 QRS Duration: 118 QT Interval:  412 QTC Calculation: 463 R Axis:   -46 Text Interpretation:  Atrial-paced rhythm with prolonged AV conduction Left axis deviation Septal infarct , age undetermined ST & T wave abnormality, consider lateral ischemia Abnormal ECG When compared with ECG of 09/30/2015, No significant change was found Confirmed by  [facility]  MD, Dontavius Keim ([id number]) on 10/18/2015 2:30:39 AM       Radiology Dg Chest 2 View  Result Date: 10/18/2015 CLINICAL DATA:  Upper chest pressure and shortness of breath tonight. Ankle swelling. History of hypertension, hyperlipidemia, CHF and diabetes. EXAM: CHEST  2 VIEW COMPARISON:  Chest radiograph September 30, 2015 FINDINGS: Cardiac silhouette is mildly enlarged, mediastinal silhouette is unremarkable. Elevated RIGHT hemidiaphragm. Chronic bronchitic changes without pleural effusion or focal consolidation. No pneumothorax. Kerley B-lines and mild interstitial prominence compatible with chronic CHF. No pleural effusion or focal consolidation. Strandy densities bilateral lung bases. No pneumothorax. Dual lead LEFT cardiac pacemaker in situ. Soft tissue planes and included osseous structure nonsuspicious. Calcification the neck are likely vascular. IMPRESSION: Mild cardiomegaly and pulmonary edema. Mild chronic bronchitic changes and bibasilar atelectasis/scarring. Electronically Signed   By: Elon Alas M.D.   On: 10/18/2015 03:53    Procedures Procedures (including critical care time)  Medications Ordered in ED Medications  aspirin chewable tablet 324 mg (324 mg Oral Given 10/18/15 0513)     Initial Impression / Assessment and Plan / ED Course  I have reviewed the triage vital signs and the nursing notes.  Pertinent labs & imaging results that were available during my care of the patient were reviewed by me and considered in my medical decision making (see chart for details).  Clinical Course   Episode of chest discomfort worrisome for angina of or ACS. Initial ECG shows no ST or T changes and initial troponin is normal. Mild renal insufficiency is present unchanged from baseline. Old records were reviewed, and she had a catheterization in 2013 showing a 50% lesion in her right coronary artery and 50-60% lesion in her circumflex artery. September 18, she had a negative nuclear  stress test. Angina and ACS seems significantly less likely with recent negative nuclear stress test. Asymmetric swelling is worrisome for possible DVT. Will send d-dimer to rule out DVT/PE. Initial dose of aspirin is given.  D-dimer is come back normal. Repeat troponin shows slight increase but is still within the normal limits. Venous Doppler has been ordered to rule out DVT. Case is discussed with Dr. Einar Gip of cardiology service who agrees to admit the patient under observation status.  Final Clinical Impressions(s) / ED Diagnoses   Final diagnoses:  Nonspecific chest pain  Swelling of left lower extremity  Renal insufficiency    New Prescriptions New Prescriptions   No medications on file     Delora Fuel, MD [phone] [id number]

## 2016-03-11 DIAGNOSIS — Z794 Long term (current) use of insulin: Secondary | ICD-10-CM | POA: Diagnosis not present

## 2016-03-11 DIAGNOSIS — Z961 Presence of intraocular lens: Secondary | ICD-10-CM | POA: Insufficient documentation

## 2016-03-11 DIAGNOSIS — H524 Presbyopia: Secondary | ICD-10-CM | POA: Diagnosis not present

## 2016-03-11 DIAGNOSIS — H5203 Hypermetropia, bilateral: Secondary | ICD-10-CM | POA: Diagnosis not present

## 2016-03-11 DIAGNOSIS — E119 Type 2 diabetes mellitus without complications: Secondary | ICD-10-CM | POA: Diagnosis not present

## 2016-03-11 DIAGNOSIS — H52203 Unspecified astigmatism, bilateral: Secondary | ICD-10-CM | POA: Diagnosis not present

## 2016-03-11 LAB — HM DIABETES EYE EXAM

## 2016-03-20 DIAGNOSIS — I129 Hypertensive chronic kidney disease with stage 1 through stage 4 chronic kidney disease, or unspecified chronic kidney disease: Secondary | ICD-10-CM | POA: Diagnosis not present

## 2016-03-20 DIAGNOSIS — E039 Hypothyroidism, unspecified: Secondary | ICD-10-CM | POA: Diagnosis not present

## 2016-03-20 DIAGNOSIS — E785 Hyperlipidemia, unspecified: Secondary | ICD-10-CM | POA: Diagnosis not present

## 2016-03-20 DIAGNOSIS — E1122 Type 2 diabetes mellitus with diabetic chronic kidney disease: Secondary | ICD-10-CM | POA: Diagnosis not present

## 2016-04-08 DIAGNOSIS — Z950 Presence of cardiac pacemaker: Secondary | ICD-10-CM | POA: Diagnosis not present

## 2016-04-30 DIAGNOSIS — I2510 Atherosclerotic heart disease of native coronary artery without angina pectoris: Secondary | ICD-10-CM | POA: Diagnosis not present

## 2016-04-30 DIAGNOSIS — I480 Paroxysmal atrial fibrillation: Secondary | ICD-10-CM | POA: Diagnosis not present

## 2016-04-30 DIAGNOSIS — I5032 Chronic diastolic (congestive) heart failure: Secondary | ICD-10-CM | POA: Diagnosis not present

## 2016-04-30 DIAGNOSIS — Z950 Presence of cardiac pacemaker: Secondary | ICD-10-CM | POA: Diagnosis not present

## 2016-05-20 DIAGNOSIS — E559 Vitamin D deficiency, unspecified: Secondary | ICD-10-CM | POA: Diagnosis not present

## 2016-05-20 DIAGNOSIS — N390 Urinary tract infection, site not specified: Secondary | ICD-10-CM | POA: Diagnosis not present

## 2016-05-20 DIAGNOSIS — Z0000 Encounter for general adult medical examination without abnormal findings: Secondary | ICD-10-CM | POA: Diagnosis not present

## 2016-05-20 DIAGNOSIS — E1122 Type 2 diabetes mellitus with diabetic chronic kidney disease: Secondary | ICD-10-CM | POA: Diagnosis not present

## 2016-05-27 DIAGNOSIS — I4891 Unspecified atrial fibrillation: Secondary | ICD-10-CM | POA: Diagnosis not present

## 2016-05-27 DIAGNOSIS — E1122 Type 2 diabetes mellitus with diabetic chronic kidney disease: Secondary | ICD-10-CM | POA: Diagnosis not present

## 2016-05-27 DIAGNOSIS — I5032 Chronic diastolic (congestive) heart failure: Secondary | ICD-10-CM | POA: Diagnosis not present

## 2016-05-27 DIAGNOSIS — Z0000 Encounter for general adult medical examination without abnormal findings: Secondary | ICD-10-CM | POA: Diagnosis not present

## 2016-06-04 DIAGNOSIS — I5032 Chronic diastolic (congestive) heart failure: Secondary | ICD-10-CM | POA: Diagnosis not present

## 2016-08-27 DIAGNOSIS — E559 Vitamin D deficiency, unspecified: Secondary | ICD-10-CM | POA: Diagnosis not present

## 2016-08-27 DIAGNOSIS — E785 Hyperlipidemia, unspecified: Secondary | ICD-10-CM | POA: Diagnosis not present

## 2016-08-27 DIAGNOSIS — I129 Hypertensive chronic kidney disease with stage 1 through stage 4 chronic kidney disease, or unspecified chronic kidney disease: Secondary | ICD-10-CM | POA: Diagnosis not present

## 2016-08-27 DIAGNOSIS — E039 Hypothyroidism, unspecified: Secondary | ICD-10-CM | POA: Diagnosis not present

## 2016-08-27 DIAGNOSIS — E1122 Type 2 diabetes mellitus with diabetic chronic kidney disease: Secondary | ICD-10-CM | POA: Diagnosis not present

## 2016-09-03 DIAGNOSIS — E039 Hypothyroidism, unspecified: Secondary | ICD-10-CM | POA: Diagnosis not present

## 2016-09-03 DIAGNOSIS — I4891 Unspecified atrial fibrillation: Secondary | ICD-10-CM | POA: Diagnosis not present

## 2016-09-03 DIAGNOSIS — E1122 Type 2 diabetes mellitus with diabetic chronic kidney disease: Secondary | ICD-10-CM | POA: Diagnosis not present

## 2016-09-03 DIAGNOSIS — E781 Pure hyperglyceridemia: Secondary | ICD-10-CM | POA: Diagnosis not present

## 2016-09-03 DIAGNOSIS — E785 Hyperlipidemia, unspecified: Secondary | ICD-10-CM | POA: Diagnosis not present

## 2016-09-03 DIAGNOSIS — E559 Vitamin D deficiency, unspecified: Secondary | ICD-10-CM | POA: Diagnosis not present

## 2016-09-03 DIAGNOSIS — N183 Chronic kidney disease, stage 3 (moderate): Secondary | ICD-10-CM | POA: Diagnosis not present

## 2016-09-03 DIAGNOSIS — I129 Hypertensive chronic kidney disease with stage 1 through stage 4 chronic kidney disease, or unspecified chronic kidney disease: Secondary | ICD-10-CM | POA: Diagnosis not present

## 2016-09-03 DIAGNOSIS — I5032 Chronic diastolic (congestive) heart failure: Secondary | ICD-10-CM | POA: Diagnosis not present

## 2016-09-11 DIAGNOSIS — Z45010 Encounter for checking and testing of cardiac pacemaker pulse generator [battery]: Secondary | ICD-10-CM | POA: Diagnosis not present

## 2016-09-11 DIAGNOSIS — Z950 Presence of cardiac pacemaker: Secondary | ICD-10-CM | POA: Diagnosis not present

## 2016-09-19 DIAGNOSIS — I129 Hypertensive chronic kidney disease with stage 1 through stage 4 chronic kidney disease, or unspecified chronic kidney disease: Secondary | ICD-10-CM | POA: Diagnosis not present

## 2016-09-19 DIAGNOSIS — N183 Chronic kidney disease, stage 3 (moderate): Secondary | ICD-10-CM | POA: Diagnosis not present

## 2016-09-19 DIAGNOSIS — E785 Hyperlipidemia, unspecified: Secondary | ICD-10-CM | POA: Diagnosis not present

## 2016-09-19 DIAGNOSIS — E039 Hypothyroidism, unspecified: Secondary | ICD-10-CM | POA: Diagnosis not present

## 2016-09-19 DIAGNOSIS — E1122 Type 2 diabetes mellitus with diabetic chronic kidney disease: Secondary | ICD-10-CM | POA: Diagnosis not present

## 2016-10-08 ENCOUNTER — Other Ambulatory Visit (HOSPITAL_COMMUNITY)
Admission: RE | Admit: 2016-10-08 | Discharge: 2016-10-08 | Disposition: A | Discharge status: Home / Self Care | Payer: PPO | Source: Ambulatory Visit | Attending: Physician Assistant | Admitting: Physician Assistant

## 2016-10-08 ENCOUNTER — Ambulatory Visit (INDEPENDENT_AMBULATORY_CARE_PROVIDER_SITE_OTHER): Payer: PPO | Admitting: Physician Assistant

## 2016-10-08 ENCOUNTER — Encounter: Payer: Self-pay | Admitting: Physician Assistant

## 2016-10-08 VITALS — BP 130/62 | HR 73 | Temp 98.7°F | Resp 14 | Ht 62.0 in | Wt 192.0 lb

## 2016-10-08 DIAGNOSIS — E119 Type 2 diabetes mellitus without complications: Secondary | ICD-10-CM

## 2016-10-08 DIAGNOSIS — I5032 Chronic diastolic (congestive) heart failure: Secondary | ICD-10-CM

## 2016-10-08 DIAGNOSIS — I480 Paroxysmal atrial fibrillation: Secondary | ICD-10-CM | POA: Diagnosis not present

## 2016-10-08 DIAGNOSIS — L299 Pruritus, unspecified: Secondary | ICD-10-CM | POA: Diagnosis not present

## 2016-10-08 DIAGNOSIS — L298 Other pruritus: Secondary | ICD-10-CM | POA: Diagnosis not present

## 2016-10-08 DIAGNOSIS — N898 Other specified noninflammatory disorders of vagina: Secondary | ICD-10-CM

## 2016-10-08 LAB — COMPREHENSIVE METABOLIC PANEL
ALT: 34 U/L (ref 0–35)
AST: 33 U/L (ref 0–37)
Albumin: 4.6 g/dL (ref 3.5–5.2)
Alkaline Phosphatase: 29 U/L — ABNORMAL LOW (ref 39–117)
BUN: 16 mg/dL (ref 6–23)
CALCIUM: 11 mg/dL — AB (ref 8.4–10.5)
CO2: 34 meq/L — AB (ref 19–32)
Chloride: 97 mEq/L (ref 96–112)
Creatinine, Ser: 0.96 mg/dL (ref 0.40–1.20)
GFR: 60.43 mL/min (ref >60.00)
Glucose, Bld: 107 mg/dL — ABNORMAL HIGH (ref 70–99)
Potassium: 4.2 mEq/L (ref 3.5–5.1)
Sodium: 138 mEq/L (ref 135–145)
Total Bilirubin: 0.4 mg/dL (ref 0.2–1.2)
Total Protein: 7.2 g/dL (ref 6.0–8.3)

## 2016-10-08 LAB — URINALYSIS, ROUTINE W REFLEX MICROSCOPIC
BILIRUBIN URINE: NEGATIVE
Hgb urine dipstick: NEGATIVE
KETONES UR: NEGATIVE
LEUKOCYTES UA: NEGATIVE
Nitrite: NEGATIVE
PH: 6 (ref 5.0–8.0)
RBC / HPF: NONE SEEN (ref 0–?)
Specific Gravity, Urine: <=1.005 — AB (ref 1.000–1.030)
TOTAL PROTEIN, URINE-UPE24: NEGATIVE
UROBILINOGEN UA: 0.2 (ref 0.0–1.0)
Urine Glucose: NEGATIVE

## 2016-10-08 LAB — TSH: TSH: 0.05 u[IU]/mL — ABNORMAL LOW (ref 0.35–4.50)

## 2016-10-08 NOTE — Patient Instructions (Addendum)
Please go to the lab for blood work. I will call you with your results.  Please continue the cortisone cream. Start a non-drowsy Xyzal for itch. We will alter regimen based on results.  We will do the same regarding vaginal itching.  Please avoid applying new things to the area until we can figure out what is going on.

## 2016-10-08 NOTE — Progress Notes (Signed)
Patient presents to clinic today to establish care.   Acute Concerns: Patient endorses a few weeks of generalized pruritus and dryness of skin. Has been scratching at the areas. Denies rash. Denies recent travel of sick contact. Is taking medications as directed. Denies recent change in regimen. Denies new soaps, lotions, detergents, etc.    Patient also endorses long-standing history of vaginal dryness and itch. Denies vaginal pain or discharge. Denies dyspareunia but says she has not been sexually active in several years. Denies lesion. Has not seen GYN.  Chronic Issues: CHF -- Long-standing history. Is followed by Cardiology (Dr. Nadyne Coombes). Is currently on regimen of furosemide 40 mg, Imdur 30 mg, Lopressor 50 mg BID, Diovan-HCT 320-12.5 mg daily. Is on Eliquis BID. Has scheduled follow-ups every 6 months. Patient denies chest pain, palpitations, lightheadedness, dizziness, vision changes or frequent headaches.  BP Readings from Last 3 Encounters:  10/08/16 130/62  10/18/15 (!) 120/50  10/01/15 (!) 146/51   Hyperlipidemia -- Currently on a regimen of Pravastatin 40 mg daily and Fenofibrate 160 mg.  Has been taking as directed. Has been out of Pravachol for 1 week.  Diabetes Mellitus II -- Endorses taking medications as directed. Is on Metformin 1000 mg BID. Denies side effects. Does not check sugars at home. Endorses eye exam and foot exam up-to-date. Endorses immunizations up-to-date. Declines flu shot today.  Hypothyroidism -- Currently on a regimen of levothyroxine 50 mcg daily. Is taking as directed. Endorses being on this regimen for some time without having to change dose. Denies constipation of depressed mood.  Health Maintenance: Immunizations -- Declines flu shot today. Unsure of other immunizations. Will get records. Colonoscopy -- Patient endorses up-to-date. Will need records.  Mammogram -- Last mammogram 2 years ago. Has repeat imaging scheduled.  Bone Density -- Patient  unsure of last DEXA. Will obtain records to review.    Past Medical History:  Diagnosis Date  . Arthritis   . CHF (congestive heart failure) ([place])   . Diabetes mellitus without complication ([place])   . History of chickenpox   . Hx of psoriatic arthritis   . Hyperlipemia   . Hypertension   . Mitral valve disorders(424.0)   . Obstructive sleep apnea   . Peripheral neuropathy   . Renal disorder Kidney disease stage2  . Thyroid disease hypothyroid  . Urine incontinence   . Vitamin D deficiency     Past Surgical History:  Procedure Laterality Date  . CARDIAC CATHETERIZATION N/A 04/29/2015   Procedure: Temporary Pacemaker;  Surgeon: Adrian Prows, MD;  Location: [facility];  Service: Cardiovascular;  Laterality: N/A;  . EP IMPLANTABLE DEVICE N/A 04/30/2015   Procedure: Pacemaker Implant;  Surgeon: Evans Lance, MD;  Location: [facility];  Service: Cardiovascular;  Laterality: N/A;  . LEFT AND RIGHT HEART CATHETERIZATION WITH CORONARY ANGIOGRAM N/A 09/30/2011   Procedure: LEFT AND RIGHT HEART CATHETERIZATION WITH CORONARY ANGIOGRAM;  Surgeon: Laverda Page, MD;  Location: [facility] CATH LAB;  Service: Cardiovascular;  Laterality: N/A;  . YAG LASER APPLICATION Right 07/10/3149   Procedure: YAG LASER APPLICATION;  Surgeon: Elta Guadeloupe T. Gershon Crane, MD;  Location: [facility];  Service: Ophthalmology;  Laterality: Right;  pt knows to arrive at 11:15  . YAG LASER APPLICATION Left 07/18/1605   Procedure: YAG LASER APPLICATION;  Surgeon: Rutherford Guys, MD;  Location: [facility];  Service: Ophthalmology;  Laterality: Left;    Current Outpatient Prescriptions on File Prior to Visit  Medication Sig Dispense Refill  . apixaban (  ELIQUIS) 5 MG TABS tablet Take 1 tablet (5 mg total) by mouth 2 (two) times daily. 60 tablet 3  . cholecalciferol (VITAMIN D) 1000 UNITS tablet Take 2,000 Units by mouth 2 (two) times daily.     . fenofibrate 160 MG tablet Take 160 mg by mouth daily with supper.     . furosemide  (LASIX) 40 MG tablet Take 40 mg by mouth daily.    . insulin NPH-regular Human (NOVOLIN 70/30) (70-30) 100 UNIT/ML injection Inject 35-75 Units into the skin 2 (two) times daily with a meal. Inject 75 units subcutaneously with breakfast and 35 units with supper    . isosorbide mononitrate (IMDUR) 30 MG 24 hr tablet Take 1 tablet (30 mg total) by mouth at bedtime.    . metFORMIN (GLUCOPHAGE) 500 MG tablet Take 1,000 mg by mouth 2 (two) times daily with a meal.    . metoprolol tartrate (LOPRESSOR) 50 MG tablet Take 1 tablet (50 mg total) by mouth 2 (two) times daily. 60 tablet 3  . nitroGLYCERIN (NITROSTAT) 0.4 MG SL tablet Place 1 tablet (0.4 mg total) under the tongue every 5 (five) minutes as needed for chest pain. 25 tablet 1  . pravastatin (PRAVACHOL) 40 MG tablet Take 40 mg by mouth at bedtime.     . valsartan-hydrochlorothiazide (DIOVAN-HCT) 320-12.5 MG per tablet Take 1 tablet by mouth daily.     No current facility-administered medications on file prior to visit.     Allergies  Allergen Reactions  . Hydrocodone Other (See Comments)    lethargic  . Invokana [Canagliflozin] Other (See Comments)    Made heart race  . Oxycodone Other (See Comments)    lethargic    Family History  Problem Relation Age of Onset  . Arthritis Mother   . Diabetes Mother   . Heart disease Mother   . Hyperlipidemia Mother   . Hypertension Mother   . Arthritis Father   . Asthma Father   . Heart attack Father   . Hyperlipidemia Father   . Hypertension Father   . Stroke Father   . Arthritis Sister   . Diabetes Sister   . Hypertension Sister   . Arthritis Brother   . Heart attack Brother   . Heart disease Brother   . Hyperlipidemia Brother   . Hypertension Brother   . Alcohol abuse Brother   . Arthritis Brother   . Diabetes Brother   . Early death Brother   . Heart disease Brother   . Hyperlipidemia Brother   . Hypertension Brother   . Arthritis Sister   . Cancer Sister   . Diabetes Sister    . Hyperlipidemia Sister   . Hypertension Sister   . Stroke Sister   . Cancer Sister   . Hyperlipidemia Sister   . Hypertension Sister   . Hypertension Daughter     Social History   Social History  . Marital status: Married    Spouse name: N/A  . Number of children: N/A  . Years of education: N/A   Occupational History  . Not on file.   Social History Main Topics  . Smoking status: Never Smoker  . Smokeless tobacco: Never Used  . Alcohol use No  . Drug use: No  . Sexual activity: Not on file   Other Topics Concern  . Not on file   Social History Narrative  . No narrative on file   Review of Systems  Constitutional: Negative for fever and malaise/fatigue.  Eyes:  Negative for blurred vision and double vision.  Respiratory: Negative for cough and shortness of breath.   Cardiovascular: Negative for chest pain and palpitations.  Gastrointestinal: Negative.   Genitourinary: Negative.   Skin: Positive for itching.  Neurological: Negative for dizziness and loss of consciousness.   BP 130/62   Pulse 73   Temp 98.7 F (37.1 C) (Oral)   Resp 14   Ht 5\' 2"  (1.575 m)   Wt 192 lb (87.1 kg)   SpO2 97%   BMI 35.12 kg/m   Physical Exam  Constitutional: She is oriented to person, place, and time and well-developed, well-nourished, and in no distress.  HENT:  Head: Normocephalic and atraumatic.  Eyes: Conjunctivae are normal.  Neck: Neck supple. No thyromegaly present.  Cardiovascular: Normal rate, regular rhythm, normal heart sounds and intact distal pulses.   Pulmonary/Chest: Effort normal and breath sounds normal. No respiratory distress. She has no wheezes. She has no rales. She exhibits no tenderness.  Genitourinary:  Genitourinary Comments: Declines GU examination today.  Lymphadenopathy:    She has no cervical adenopathy.  Neurological: She is alert and oriented to person, place, and time. No cranial nerve deficit.  Skin: Skin is warm and dry. No rash noted.    Psychiatric: Affect normal.  Vitals reviewed.  Assessment/Plan: Chronic diastolic heart failure ([place]) Followed by Cardiology. Is taking medications as directed. Vitals stable. Euvolemic on examination. Exam unremarkable. Continue current regimen. Will obtain records. Follow-up with Cardiology as scheduled.   Paroxysmal atrial fibrillation ([place]) Managed by Cardiology. Exam unremarkable. Vitals stable. Asymptomatic. Continue current regimen.   Diabetes mellitus without complication ([place]) Taking medications as directed. Is not checking sugar levels. Will obtain records for review to see last A1C, etc. Will abstract into chart and update what is needed. Continue current regimen for now. Declines flu shot today. Endorses pneumonia series up-to-date. Again, will obtain records. Eye exam up-to-date.   Pruritus Unclear etiology. No rash noted on examination. Xerotic skin noted. No recent change in hygiene products. No pets in the home. Patient with hypothyroidism. Needs repeat TSH today. Will also check liver function. Discussed OTC medications to help with itch. Will alter regimen based on lab results.  Vaginal itching Vaginal itch and dryness. History seems most consistent with atrophic vaginitis. Patient declines GU examination today. Urine ancillary studies obtained to r/o yeast and BV. IF unremarkable, will consider addition of vaginal premarin.    Leeanne [place], PA-C

## 2016-10-08 NOTE — Progress Notes (Signed)
Pre visit review using our clinic review tool, if applicable. No additional management support is needed unless otherwise documented below in the visit note. 

## 2016-10-09 ENCOUNTER — Other Ambulatory Visit: Payer: Self-pay | Admitting: Emergency Medicine

## 2016-10-09 MED ORDER — LEVOTHYROXINE SODIUM 25 MCG PO TABS: 25.0000 ug | ORAL_TABLET | Freq: Every day | ORAL | 1 refills | Status: DC

## 2016-10-11 DIAGNOSIS — I480 Paroxysmal atrial fibrillation: Secondary | ICD-10-CM

## 2016-10-11 DIAGNOSIS — L299 Pruritus, unspecified: Secondary | ICD-10-CM | POA: Insufficient documentation

## 2016-10-11 DIAGNOSIS — I4891 Unspecified atrial fibrillation: Secondary | ICD-10-CM

## 2016-10-11 DIAGNOSIS — N898 Other specified noninflammatory disorders of vagina: Secondary | ICD-10-CM | POA: Insufficient documentation

## 2016-10-11 HISTORY — DX: Paroxysmal atrial fibrillation: I48.0

## 2016-10-11 NOTE — Assessment & Plan Note (Signed)
Vaginal itch and dryness. History seems most consistent with atrophic vaginitis. Patient declines GU examination today. Urine ancillary studies obtained to r/o yeast and BV. IF unremarkable, will consider addition of vaginal premarin.

## 2016-10-11 NOTE — Assessment & Plan Note (Signed)
Taking medications as directed. Is not checking sugar levels. Will obtain records for review to see last A1C, etc. Will abstract into chart and update what is needed. Continue current regimen for now. Declines flu shot today. Endorses pneumonia series up-to-date. Again, will obtain records. Eye exam up-to-date.

## 2016-10-11 NOTE — Assessment & Plan Note (Signed)
Managed by Cardiology. Exam unremarkable. Vitals stable. Asymptomatic. Continue current regimen.

## 2016-10-11 NOTE — Assessment & Plan Note (Signed)
Followed by Cardiology. Is taking medications as directed. Vitals stable. Euvolemic on examination. Exam unremarkable. Continue current regimen. Will obtain records. Follow-up with Cardiology as scheduled.

## 2016-10-11 NOTE — Assessment & Plan Note (Signed)
Unclear etiology. No rash noted on examination. Xerotic skin noted. No recent change in hygiene products. No pets in the home. Patient with hypothyroidism. Needs repeat TSH today. Will also check liver function. Discussed OTC medications to help with itch. Will alter regimen based on lab results.

## 2016-10-13 ENCOUNTER — Other Ambulatory Visit: Payer: PPO

## 2016-10-13 LAB — URINE CYTOLOGY ANCILLARY ONLY
Bacterial vaginitis: NEGATIVE
Candida vaginitis: NEGATIVE

## 2016-10-15 ENCOUNTER — Other Ambulatory Visit: Payer: Self-pay | Admitting: Emergency Medicine

## 2016-10-15 ENCOUNTER — Other Ambulatory Visit: Payer: Self-pay | Admitting: Physician Assistant

## 2016-10-15 LAB — PTH, INTACT AND CALCIUM
CALCIUM: 9.9 mg/dL (ref 8.6–10.4)
PTH: 29 pg/mL (ref 14–64)

## 2016-10-15 MED ORDER — ESTROGENS, CONJUGATED 0.625 MG/GM VA CREA: 1.0000 | TOPICAL_CREAM | Freq: Every day | VAGINAL | 0 refills | Status: DC

## 2016-11-03 DIAGNOSIS — I10 Essential (primary) hypertension: Secondary | ICD-10-CM | POA: Diagnosis not present

## 2016-11-03 DIAGNOSIS — I480 Paroxysmal atrial fibrillation: Secondary | ICD-10-CM | POA: Diagnosis not present

## 2016-11-03 DIAGNOSIS — I5032 Chronic diastolic (congestive) heart failure: Secondary | ICD-10-CM | POA: Diagnosis not present

## 2016-11-03 DIAGNOSIS — I495 Sick sinus syndrome: Secondary | ICD-10-CM | POA: Diagnosis not present

## 2016-11-17 DIAGNOSIS — I10 Essential (primary) hypertension: Secondary | ICD-10-CM | POA: Diagnosis not present

## 2016-11-25 ENCOUNTER — Encounter: Payer: Self-pay | Admitting: Physician Assistant

## 2016-11-25 ENCOUNTER — Other Ambulatory Visit: Payer: Self-pay

## 2016-11-25 ENCOUNTER — Ambulatory Visit: Payer: PPO | Admitting: Physician Assistant

## 2016-11-25 VITALS — BP 130/70 | HR 73 | Temp 98.6°F | Resp 14 | Ht 62.0 in | Wt 189.0 lb

## 2016-11-25 DIAGNOSIS — N898 Other specified noninflammatory disorders of vagina: Secondary | ICD-10-CM

## 2016-11-25 DIAGNOSIS — E039 Hypothyroidism, unspecified: Secondary | ICD-10-CM

## 2016-11-25 LAB — POCT URINALYSIS DIPSTICK
Bilirubin, UA: NEGATIVE
Blood, UA: NEGATIVE
Glucose, UA: NEGATIVE
KETONES UA: NEGATIVE
Leukocytes, UA: NEGATIVE
Nitrite, UA: NEGATIVE
PH UA: 6.5 (ref 5.0–8.0)
PROTEIN UA: NEGATIVE
SPEC GRAV UA: 1.01 (ref 1.010–1.025)
Urobilinogen, UA: 0.2 E.U./dL

## 2016-11-25 LAB — BASIC METABOLIC PANEL
BUN: 22 mg/dL (ref 6–23)
CO2: 27 mEq/L (ref 19–32)
CREATININE: 1.1 mg/dL (ref 0.40–1.20)
Calcium: 10.3 mg/dL (ref 8.4–10.5)
Chloride: 100 mEq/L (ref 96–112)
GFR: 51.63 mL/min — AB (ref >60.00)
Glucose, Bld: 133 mg/dL — ABNORMAL HIGH (ref 70–99)
Potassium: 4.4 mEq/L (ref 3.5–5.1)
Sodium: 136 mEq/L (ref 135–145)

## 2016-11-25 LAB — TSH: TSH: 0.03 u[IU]/mL — ABNORMAL LOW (ref 0.35–4.50)

## 2016-11-25 LAB — VITAMIN D 25 HYDROXY (VIT D DEFICIENCY, FRACTURES): VITD: 45.54 ng/mL (ref 30.00–100.00)

## 2016-11-25 NOTE — Progress Notes (Signed)
Pre visit review using our clinic review tool, if applicable. No additional management support is needed unless otherwise documented below in the visit note. 

## 2016-11-25 NOTE — Patient Instructions (Signed)
Please go to the lab today for blood work.  I will call you with your results. We will alter treatment regimen(s) if indicated by your results.   Start over-the-counter Replens vaginal moisturizer. Use non-scented and non-dyed soaps when cleaning these sensitive tissues. A very small amount of soap goes a long way.  We will alter regimen based on repeat lab results.

## 2016-11-25 NOTE — Progress Notes (Signed)
Patient presents to clinic today to follow-up regarding thyroid, calcium and vaginal pruritus.  Hypothyroidism -- Regimen altered last check. Patient is taking medication as directed. Denies side effect. Due for repeat TSH level today.  Hypercalcemia -- isolated elevated calcium on initial labs with me. Repeat calcium along with PTH were normal. Renal function stable. Patient states she saw her Cardiologist a week or so ago and was told calcium was mildly elevated and she needed to follow-up here. Patient denies abdominal pain, AMS, hx of nephrolithiasis.   Pruritus -- previous workup unremarkable. Refuses exam. Suspect secondary to vatginal atrophy. Previous UA and ancillary testing negative. Started rx for premarin but has not started due to cost. Denies new or worsening symptoms.   Past Medical History:  Diagnosis Date  . Arthritis   . CHF (congestive heart failure) ([place])   . Diabetes mellitus without complication ([place])   . History of chickenpox   . Hx of psoriatic arthritis   . Hyperlipemia   . Hypertension   . Mitral valve disorders(424.0)   . Obstructive sleep apnea   . Peripheral neuropathy   . Renal disorder Kidney disease stage2  . Thyroid disease hypothyroid  . Urine incontinence   . Vitamin D deficiency     Current Outpatient Medications on File Prior to Visit  Medication Sig Dispense Refill  . apixaban (ELIQUIS) 5 MG TABS tablet Take 1 tablet (5 mg total) by mouth 2 (two) times daily. 60 tablet 3  . cholecalciferol (VITAMIN D) 1000 UNITS tablet Take 2,000 Units by mouth 2 (two) times daily.     . fenofibrate 160 MG tablet Take 160 mg by mouth daily with supper.     . insulin NPH-regular Human (NOVOLIN 70/30) (70-30) 100 UNIT/ML injection Inject 35-75 Units into the skin 2 (two) times daily with a meal. Inject 75 units subcutaneously with breakfast and 35 units with supper    . isosorbide mononitrate (IMDUR) 30 MG 24 hr tablet Take 1 tablet (30 mg total) by mouth at  bedtime.    Marland Kitchen levothyroxine (SYNTHROID, LEVOTHROID) 50 MCG tablet Take 1 tablet daily by mouth.    . metFORMIN (GLUCOPHAGE) 500 MG tablet Take 1,000 mg by mouth 2 (two) times daily with a meal.    . metoprolol tartrate (LOPRESSOR) 50 MG tablet Take 1 tablet (50 mg total) by mouth 2 (two) times daily. 60 tablet 3  . nitroGLYCERIN (NITROSTAT) 0.4 MG SL tablet Place 1 tablet (0.4 mg total) under the tongue every 5 (five) minutes as needed for chest pain. 25 tablet 1  . olmesartan-hydrochlorothiazide (BENICAR HCT) 40-25 MG tablet Take 1 tablet daily by mouth.    . omega-3 acid ethyl esters (LOVAZA) 1 g capsule Take 2 capsules 2 (two) times daily by mouth.    . simvastatin (ZOCOR) 20 MG tablet Take 1 tablet every evening by mouth.    . spironolactone (ALDACTONE) 25 MG tablet Take 1 tablet every morning by mouth.     No current facility-administered medications on file prior to visit.     Allergies  Allergen Reactions  . Hydrocodone Other (See Comments)    lethargic  . Invokana [Canagliflozin] Other (See Comments)    Made heart race  . Oxycodone Other (See Comments)    lethargic    Family History  Problem Relation Age of Onset  . Arthritis Mother   . Diabetes Mother   . Heart disease Mother   . Hyperlipidemia Mother   . Hypertension Mother   . Arthritis  Father   . Asthma Father   . Heart attack Father   . Hyperlipidemia Father   . Hypertension Father   . Stroke Father   . Arthritis Sister   . Diabetes Sister   . Hypertension Sister   . Arthritis Brother   . Heart attack Brother   . Heart disease Brother   . Hyperlipidemia Brother   . Hypertension Brother   . Alcohol abuse Brother   . Arthritis Brother   . Diabetes Brother   . Early death Brother   . Heart disease Brother   . Hyperlipidemia Brother   . Hypertension Brother   . Arthritis Sister   . Cancer Sister   . Diabetes Sister   . Hyperlipidemia Sister   . Hypertension Sister   . Stroke Sister   . Cancer Sister     . Hyperlipidemia Sister   . Hypertension Sister   . Hypertension Daughter     Social History   Socioeconomic History  . Marital status: Married    Spouse name: None  . Number of children: None  . Years of education: None  . Highest education level: None  Social Needs  . Financial resource strain: None  . Food insecurity - worry: None  . Food insecurity - inability: None  . Transportation needs - medical: None  . Transportation needs - non-medical: None  Occupational History  . None  Tobacco Use  . Smoking status: Never Smoker  . Smokeless tobacco: Never Used  Substance and Sexual Activity  . Alcohol use: No    Alcohol/week: 0.0 oz  . Drug use: No  . Sexual activity: None  Other Topics Concern  . None  Social History Narrative  . None    Review of Systems - See HPI.  All other ROS are negative.  BP 130/70   Pulse 73   Temp 98.6 F (37 C) (Oral)   Resp 14   Ht 5' 2"  (1.575 m)   Wt 189 lb (85.7 kg)   SpO2 94%   BMI 34.57 kg/m   Physical Exam  Constitutional: She is oriented to person, place, and time and well-developed, well-nourished, and in no distress.  HENT:  Head: Normocephalic and atraumatic.  Eyes: Conjunctivae are normal.  Neck: Neck supple.  Cardiovascular: Normal rate, regular rhythm, normal heart sounds and intact distal pulses.  Pulmonary/Chest: Effort normal and breath sounds normal. No respiratory distress. She has no wheezes. She has no rales. She exhibits no tenderness.  Genitourinary:  Genitourinary Comments: Defers exam  Neurological: She is alert and oriented to person, place, and time.  Skin: Skin is warm and dry. No rash noted.  Psychiatric: Affect normal.  Vitals reviewed.  Recent Results (from the past 2160 hour(s))  Urine cytology ancillary only     Status: None   Collection Time: 10/08/16 12:00 AM  Result Value Ref Range   Bacterial vaginitis Negative for Bacterial Vaginitis Microorganisms     Comment: Normal Reference Range  - Negative   Candida vaginitis Negative for Candida Vaginitis Microorganisms     Comment: Normal Reference Range - Negative  Urinalysis, Routine w reflex microscopic     Status: Abnormal   Collection Time: 10/08/16  2:41 PM  Result Value Ref Range   Color, Urine YELLOW Yellow;Lt. Yellow   APPearance CLEAR Clear   Specific Gravity, Urine <=1.005 (A) 1.000 - 1.030   pH 6.0 5.0 - 8.0   Total Protein, Urine NEGATIVE Negative   Urine Glucose NEGATIVE Negative  Ketones, ur NEGATIVE Negative   Bilirubin Urine NEGATIVE Negative   Hgb urine dipstick NEGATIVE Negative   Urobilinogen, UA 0.2 0.0 - 1.0   Leukocytes, UA NEGATIVE Negative   Nitrite NEGATIVE Negative   WBC, UA 0-2/hpf 0-2/hpf   RBC / HPF none seen 0-2/hpf   Squamous Epithelial / LPF Rare(0-4/hpf) Rare(0-4/hpf)  Comp Met (CMET)     Status: Abnormal   Collection Time: 10/08/16  2:41 PM  Result Value Ref Range   Sodium 138 135 - 145 mEq/L   Potassium 4.2 3.5 - 5.1 mEq/L   Chloride 97 96 - 112 mEq/L   CO2 34 (H) 19 - 32 mEq/L   Glucose, Bld 107 (H) 70 - 99 mg/dL   BUN 16 6 - 23 mg/dL   Creatinine, Ser 0.96 0.40 - 1.20 mg/dL   Total Bilirubin 0.4 0.2 - 1.2 mg/dL   Alkaline Phosphatase 29 (L) 39 - 117 U/L   AST 33 0 - 37 U/L   ALT 34 0 - 35 U/L   Total Protein 7.2 6.0 - 8.3 g/dL   Albumin 4.6 3.5 - 5.2 g/dL   Calcium 11.0 (H) 8.4 - 10.5 mg/dL   GFR 60.43 >60.00 mL/min  TSH     Status: Abnormal   Collection Time: 10/08/16  2:41 PM  Result Value Ref Range   TSH 0.05 (L) 0.35 - 4.50 uIU/mL  PTH, intact and calcium     Status: None   Collection Time: 10/13/16 10:14 AM  Result Value Ref Range   PTH 29 14 - 64 pg/mL    Comment: . Interpretive Guide    Intact PTH           Calcium ------------------    ----------           ------- Normal Parathyroid    Normal               Normal Hypoparathyroidism    Low or Low Normal    Low Hyperparathyroidism    Primary            Normal or High       High    Secondary          High                  Normal or Low    Tertiary           High                 High Non-Parathyroid    Hypercalcemia      Low or Low Normal    High .    Calcium 9.9 8.6 - 10.4 mg/dL  POCT urinalysis dipstick     Status: Normal   Collection Time: 11/25/16  2:31 PM  Result Value Ref Range   Color, UA yellow    Clarity, UA clear    Glucose, UA negative    Bilirubin, UA negative    Ketones, UA negative    Spec Grav, UA 1.010 1.010 - 1.025   Blood, UA negative    pH, UA 6.5 5.0 - 8.0   Protein, UA negative    Urobilinogen, UA 0.2 0.2 or 1.0 E.U./dL   Nitrite, UA negative    Leukocytes, UA Negative Negative   Assessment/Plan: 1. Vaginal itching Start OTC Replens vaginal moisturizer. Urine dip negative. Urine culture sent. May need GYN assessment. - POCT urinalysis dipstick - Urine Culture  2. Serum calcium elevated Normal on last  check. Patient endorses elevated at Cardiology. Will repeat BMP. Recent PTH and renal function normal as well as calcium. Will also check Vit D level. - Basic metabolic panel - Vitamin D (25 hydroxy)  3. Hypothyroidism, unspecified type Repeat TSH level today. - TSH   Leeanne Rio, PA-C

## 2016-11-26 ENCOUNTER — Other Ambulatory Visit (INDEPENDENT_AMBULATORY_CARE_PROVIDER_SITE_OTHER): Payer: PPO

## 2016-11-26 ENCOUNTER — Ambulatory Visit: Payer: PPO | Admitting: Physician Assistant

## 2016-11-26 DIAGNOSIS — E039 Hypothyroidism, unspecified: Secondary | ICD-10-CM

## 2016-11-26 DIAGNOSIS — R790 Abnormal level of blood mineral: Secondary | ICD-10-CM

## 2016-11-26 LAB — T4, FREE: FREE T4: 1.3 ng/dL (ref 0.60–1.60)

## 2016-11-27 ENCOUNTER — Other Ambulatory Visit: Payer: Self-pay | Admitting: Physician Assistant

## 2016-11-27 DIAGNOSIS — E039 Hypothyroidism, unspecified: Secondary | ICD-10-CM

## 2016-11-27 MED ORDER — LEVOTHYROXINE SODIUM 25 MCG PO TABS: 25.0000 ug | ORAL_TABLET | Freq: Every day | ORAL | 1 refills | Status: DC

## 2016-11-28 ENCOUNTER — Other Ambulatory Visit: Payer: Self-pay | Admitting: Physician Assistant

## 2016-11-28 DIAGNOSIS — N390 Urinary tract infection, site not specified: Secondary | ICD-10-CM

## 2016-11-28 LAB — URINE CULTURE
MICRO NUMBER:: 81278994
SPECIMEN QUALITY:: ADEQUATE

## 2016-11-28 MED ORDER — CEPHALEXIN 500 MG PO CAPS: 500.0000 mg | ORAL_CAPSULE | Freq: Two times a day (BID) | ORAL | 0 refills | Status: DC

## 2016-12-01 ENCOUNTER — Telehealth: Payer: Self-pay | Admitting: Physician Assistant

## 2016-12-01 ENCOUNTER — Ambulatory Visit: Payer: Self-pay | Admitting: *Deleted

## 2016-12-01 MED ORDER — AMOXICILLIN-POT CLAVULANATE 500-125 MG PO TABS: 1.0000 | ORAL_TABLET | Freq: Three times a day (TID) | ORAL | 0 refills | Status: DC

## 2016-12-01 NOTE — Telephone Encounter (Signed)
[name] -   Is there anything else that needs to be done for patient or does she need re-evaluation?

## 2016-12-01 NOTE — Telephone Encounter (Signed)
Patient will be scheduling her own appt with Dr. Debbora Presto.   Lab results from last week are being faxed over to their office for her.

## 2016-12-01 NOTE — Telephone Encounter (Signed)
FYI for referral....  Please call to sch appt to be seen for ? UTI

## 2016-12-01 NOTE — Telephone Encounter (Signed)
Stop Keflex. I have sent in low-dose Augmentin for her to take instead to resolve infection.

## 2016-12-01 NOTE — Telephone Encounter (Signed)
Patient is aware that new RX has been sent.

## 2016-12-01 NOTE — Telephone Encounter (Signed)
Copied from [place] [id number]. Topic: Inquiry >> Dec 01, 2016  2:31 PM Cecelia Byars, [place] wrote: Reason for CRM: Patient says Dr Drue Stager not have any appointments until January 4,2019  and she is already under the care of Dr Bubba Camp and wants to know if she can see her ,and also if the practice can send a recent copy of the blood work Also wants to know if there is a  urine culture for a UTI,says she is  still burning ,and itching

## 2016-12-01 NOTE — Telephone Encounter (Signed)
Called in c/o UTI not improving with the Keflex since Tuesday.   "I'm still burning so bad when I urinate and itching terrible down there".   Went over the home care instructions to help her out.  I routed a note to Dr. Earlie Server nurse pool requesting they advise her further in regards to treating the UTI since he has seen her and done a urine culture.   Reason for Disposition . Diabetes mellitus or weak immune system (e.g., HIV positive, cancer chemotherapy, transplant patient)  Answer Assessment - Initial Assessment Questions 1. ANTIBIOTIC: "What antibiotic are you taking?" "How many times per day?"     Keflex been on since Tuesday.   Still burning real bad and itching. 2. DURATION: "When was the antibiotic started?"     Tuesday 3. MAIN SYMPTOM: "What is the main symptom you are concerned about?"     Burning and itching.  Strong smelling.   4. FEVER: "Do you have a fever?" If so, ask: "What is it, how was it measured, and when did it start?"     I think so.   Not feeling good either. 5. OTHER SYMPTOMS: "Do you have any other symptoms?" (e.g., flank pain, vaginal discharge, blood in urine)     No.  Protocols used: URINARY TRACT INFECTION ON ANTIBIOTIC FOLLOW-UP CALL - FEMALE-A-AH

## 2016-12-01 NOTE — Addendum Note (Signed)
Addended by: Brunetta Jeans on: 12/01/2016 03:19 PM   Modules accepted: Orders

## 2016-12-11 DIAGNOSIS — N952 Postmenopausal atrophic vaginitis: Secondary | ICD-10-CM | POA: Diagnosis not present

## 2016-12-11 DIAGNOSIS — E0590 Thyrotoxicosis, unspecified without thyrotoxic crisis or storm: Secondary | ICD-10-CM | POA: Diagnosis not present

## 2016-12-11 DIAGNOSIS — I129 Hypertensive chronic kidney disease with stage 1 through stage 4 chronic kidney disease, or unspecified chronic kidney disease: Secondary | ICD-10-CM | POA: Diagnosis not present

## 2016-12-11 DIAGNOSIS — E1122 Type 2 diabetes mellitus with diabetic chronic kidney disease: Secondary | ICD-10-CM | POA: Diagnosis not present

## 2016-12-11 DIAGNOSIS — E039 Hypothyroidism, unspecified: Secondary | ICD-10-CM | POA: Diagnosis not present

## 2016-12-11 DIAGNOSIS — E785 Hyperlipidemia, unspecified: Secondary | ICD-10-CM | POA: Diagnosis not present

## 2016-12-16 ENCOUNTER — Encounter: Payer: Self-pay | Admitting: Adult Health

## 2016-12-16 ENCOUNTER — Ambulatory Visit: Payer: PPO | Admitting: Adult Health

## 2016-12-16 VITALS — BP 170/80 | HR 97 | Ht 62.0 in | Wt 189.0 lb

## 2016-12-16 DIAGNOSIS — N898 Other specified noninflammatory disorders of vagina: Secondary | ICD-10-CM | POA: Diagnosis not present

## 2016-12-16 DIAGNOSIS — L439 Lichen planus, unspecified: Secondary | ICD-10-CM | POA: Diagnosis not present

## 2016-12-16 DIAGNOSIS — B373 Candidiasis of vulva and vagina: Secondary | ICD-10-CM

## 2016-12-16 DIAGNOSIS — B3731 Acute candidiasis of vulva and vagina: Secondary | ICD-10-CM

## 2016-12-16 LAB — POCT URINALYSIS DIPSTICK
Glucose, UA: NEGATIVE
Leukocytes, UA: NEGATIVE
Nitrite, UA: NEGATIVE
PROTEIN UA: NEGATIVE
RBC UA: NEGATIVE

## 2016-12-16 MED ORDER — FLUCONAZOLE 100 MG PO TABS: ORAL_TABLET | ORAL | 0 refills | Status: DC

## 2016-12-16 NOTE — Addendum Note (Signed)
Addended by: Derrek Monaco A on: 12/16/2016 02:28 PM   Modules accepted: Orders

## 2016-12-16 NOTE — Patient Instructions (Signed)
Do not take zocor when taking diflucan F/U in 2 about 2 weeks

## 2016-12-16 NOTE — Progress Notes (Addendum)
Subjective:     Patient ID: [name], female   DOB: [birth year], 71 y.o.   MRN: [id number]  HPI Geisha is a 71 year old white female, married in complaining of vaginal itching for years and now vulva soreness.She had UTI recently and was treated with antibiotics twice. PCP is Elyn Aquas in [place].  Review of Systems Vaginal itching for 5-6 years Sore vulva for 2 weeks She is not sexually active, husband disabled, but would like to try  Reviewed past medical,surgical, social and family history. Reviewed medications and allergies.     Objective:   Physical Exam BP (!) 170/80 (BP Location: Left Arm, Patient Position: Sitting, Cuff Size: Small)   Pulse 97   Ht 5\' 2"  (1.575 m)   Wt 189 lb (85.7 kg)   BMI 34.57 kg/m Urine dipstick negative.Skin warm and dry.Pelvic: external genitalia is red and excoriated with bruised areas on inner labia, and has area on both inner thighs that is white,thin and rippled, non tender,  Vagina is red and dry and has no rugae, painted vulva and vagina with gentian violet and will rx diflucan for 10 days, then recheck.May need to add temovate and vaginal estrogen after this is cleared up.    Assessment:       1. Vulvovaginal candidiasis   2. Itching in the vaginal area   3. Lichen planus atrophicus       Plan:     Rx diflucan 100 mg #10 take 1 daily for 10 days Do not take zocor when on diflucan F/U 12/17 with me

## 2016-12-24 ENCOUNTER — Ambulatory Visit: Payer: PPO | Admitting: Adult Health

## 2016-12-24 ENCOUNTER — Other Ambulatory Visit: Payer: Self-pay | Admitting: Adult Health

## 2016-12-24 ENCOUNTER — Encounter: Payer: Self-pay | Admitting: Adult Health

## 2016-12-24 VITALS — BP 138/72 | HR 85 | Ht 62.0 in | Wt 188.0 lb

## 2016-12-24 DIAGNOSIS — Z87440 Personal history of urinary (tract) infections: Secondary | ICD-10-CM

## 2016-12-24 DIAGNOSIS — B373 Candidiasis of vulva and vagina: Secondary | ICD-10-CM | POA: Diagnosis not present

## 2016-12-24 DIAGNOSIS — L439 Lichen planus, unspecified: Secondary | ICD-10-CM | POA: Diagnosis not present

## 2016-12-24 DIAGNOSIS — N898 Other specified noninflammatory disorders of vagina: Secondary | ICD-10-CM

## 2016-12-24 DIAGNOSIS — B3731 Acute candidiasis of vulva and vagina: Secondary | ICD-10-CM

## 2016-12-24 LAB — POCT URINALYSIS DIPSTICK
GLUCOSE UA: NEGATIVE
Leukocytes, UA: NEGATIVE
Nitrite, UA: NEGATIVE
Protein, UA: NEGATIVE
RBC UA: NEGATIVE

## 2016-12-24 NOTE — Progress Notes (Signed)
Subjective:     Patient ID: [name], female   DOB: 25-Aug-1945, 71 y.o.   MRN: [id number]  HPI [name] is a 71 year old white female in complaining of feeling like has UTI again, and itching is better but still there.   Review of Systems Still some itching but not as bad Feels like has UTI again Reviewed past medical,surgical, social and family history. Reviewed medications and allergies.     Objective:   Physical Exam BP 138/72 (BP Location: Right Arm, Patient Position: Sitting, Cuff Size: Normal)   Pulse 85   Ht 5\' 2"  (1.575 m)   Wt 188 lb (85.3 kg)   BMI 34.39 kg/m urine dipstick negative. Skin warm and dry. Pelvic: external genitalia is less red, no excoriation or bruising on inner labia, and white area on inner thighs still purple, but smaller, painted labia and vagina with gentian violet.Will recheck next week as scheduled.  .    Assessment:     1. Vulvovaginal candidiasis   2. Lichen planus atrophicus   3. Itching in the vaginal area   4. History of UTI       Plan:   UA C&S sent Finish diflucan Follow up next week as scheduled, may add temovate then.

## 2016-12-25 LAB — URINALYSIS, ROUTINE W REFLEX MICROSCOPIC
BILIRUBIN UA: NEGATIVE
Glucose, UA: NEGATIVE
Ketones, UA: NEGATIVE
Leukocytes, UA: NEGATIVE
Nitrite, UA: NEGATIVE
PH UA: 7 (ref 5.0–7.5)
Protein, UA: NEGATIVE
RBC UA: NEGATIVE
Specific Gravity, UA: 1.008 (ref 1.005–1.030)
UUROB: 0.2 mg/dL (ref 0.2–1.0)

## 2016-12-26 ENCOUNTER — Telehealth: Payer: Self-pay | Admitting: Adult Health

## 2016-12-26 LAB — URINE CULTURE: Organism ID, Bacteria: NO GROWTH

## 2016-12-26 NOTE — Telephone Encounter (Signed)
Pt aware urine culture showed no growth

## 2016-12-29 ENCOUNTER — Ambulatory Visit: Payer: PPO | Admitting: Adult Health

## 2016-12-29 ENCOUNTER — Encounter: Payer: Self-pay | Admitting: Adult Health

## 2016-12-29 VITALS — BP 158/70 | HR 83 | Ht 62.0 in | Wt 188.0 lb

## 2016-12-29 DIAGNOSIS — R300 Dysuria: Secondary | ICD-10-CM | POA: Diagnosis not present

## 2016-12-29 DIAGNOSIS — N898 Other specified noninflammatory disorders of vagina: Secondary | ICD-10-CM | POA: Diagnosis not present

## 2016-12-29 DIAGNOSIS — L900 Lichen sclerosus et atrophicus: Secondary | ICD-10-CM

## 2016-12-29 DIAGNOSIS — L439 Lichen planus, unspecified: Secondary | ICD-10-CM

## 2016-12-29 DIAGNOSIS — B373 Candidiasis of vulva and vagina: Secondary | ICD-10-CM | POA: Diagnosis not present

## 2016-12-29 DIAGNOSIS — B3731 Acute candidiasis of vulva and vagina: Secondary | ICD-10-CM

## 2016-12-29 LAB — POCT URINALYSIS DIPSTICK
Glucose, UA: NEGATIVE
Ketones, UA: NEGATIVE
LEUKOCYTES UA: NEGATIVE
NITRITE UA: NEGATIVE
PROTEIN UA: NEGATIVE
RBC UA: NEGATIVE

## 2016-12-29 MED ORDER — TRIAMCINOLONE ACETONIDE 0.5 % EX OINT: 1.0000 "application " | TOPICAL_OINTMENT | Freq: Two times a day (BID) | CUTANEOUS | 1 refills | Status: DC

## 2016-12-29 MED ORDER — FLUCONAZOLE 100 MG PO TABS: ORAL_TABLET | ORAL | 0 refills | Status: DC

## 2016-12-29 NOTE — Progress Notes (Signed)
Subjective:     Patient ID: [name], female   DOB: 10-08-1945, 71 y.o.   MRN: [id number]  HPI [name] is a 71 year old white female back in follow up on vaginal itching and burning with urination.  Review of Systems Vaginal itching Burns  with urination Reviewed past medical,surgical, social and family history. Reviewed medications and allergies.     Objective:   Physical Exam BP (!) 158/70 (BP Location: Left Arm, Patient Position: Sitting, Cuff Size: Normal)   Pulse 83   Ht 5\' 2"  (1.575 m)   Wt 188 lb (85.3 kg)   BMI 34.39 kg/m    urine dipstick is negative.  Skin warm and dry.Pelvic: external genitalia is less red, in inner labia some excoriation and bruising noted today, and areas on inner thighs are smaller and still purple, Dr Elonda Husky in for co exam, and will paint with gentian violet again and do 2 weeks more of diflucan and add triamcinolone ointment and recheck in 2 weeks.   Assessment:     1. Vulvovaginal candidiasis   2. Lichen planus atrophicus   3. Lichen sclerosus et atrophicus   4. Itching in the vaginal area   5. Burning with urination       Plan:     Meds ordered this encounter  Medications  . fluconazole (DIFLUCAN) 100 MG tablet    Sig: Take 1 daily for 14 days    Dispense:  14 tablet    Refill:  0    Order Specific Question:   Supervising Provider    Answer:   Elonda Husky, LUTHER H [[id number]]  . triamcinolone ointment (KENALOG) 0.5 %    Sig: Apply 1 application topically 2 (two) times daily.    Dispense:  30 g    Refill:  1    Order Specific Question:   Supervising Provider    Answer:   Elonda Husky, LUTHER H [[id number]]  F/U in 2 weeks

## 2016-12-31 DIAGNOSIS — I480 Paroxysmal atrial fibrillation: Secondary | ICD-10-CM | POA: Diagnosis not present

## 2016-12-31 DIAGNOSIS — E782 Mixed hyperlipidemia: Secondary | ICD-10-CM | POA: Diagnosis not present

## 2017-01-08 DIAGNOSIS — I10 Essential (primary) hypertension: Secondary | ICD-10-CM | POA: Diagnosis not present

## 2017-01-08 DIAGNOSIS — I480 Paroxysmal atrial fibrillation: Secondary | ICD-10-CM | POA: Diagnosis not present

## 2017-01-08 DIAGNOSIS — E782 Mixed hyperlipidemia: Secondary | ICD-10-CM | POA: Diagnosis not present

## 2017-01-08 DIAGNOSIS — I5032 Chronic diastolic (congestive) heart failure: Secondary | ICD-10-CM | POA: Diagnosis not present

## 2017-01-12 ENCOUNTER — Encounter: Payer: Self-pay | Admitting: Adult Health

## 2017-01-12 ENCOUNTER — Ambulatory Visit: Payer: PPO | Admitting: Adult Health

## 2017-01-12 VITALS — BP 138/70 | HR 82 | Resp 16 | Ht 62.0 in | Wt 187.5 lb

## 2017-01-12 DIAGNOSIS — L900 Lichen sclerosus et atrophicus: Secondary | ICD-10-CM | POA: Diagnosis not present

## 2017-01-12 DIAGNOSIS — N3941 Urge incontinence: Secondary | ICD-10-CM | POA: Diagnosis not present

## 2017-01-12 DIAGNOSIS — L439 Lichen planus, unspecified: Secondary | ICD-10-CM | POA: Diagnosis not present

## 2017-01-12 MED ORDER — NYSTATIN 100000 UNIT/GM EX CREA: 1.0000 "application " | TOPICAL_CREAM | Freq: Two times a day (BID) | CUTANEOUS | 3 refills | Status: DC

## 2017-01-12 MED ORDER — OXYBUTYNIN CHLORIDE ER 5 MG PO TB24: 5.0000 mg | ORAL_TABLET | Freq: Every day | ORAL | 6 refills | Status: DC

## 2017-01-12 NOTE — Progress Notes (Signed)
Subjective:     Patient ID: [name], female   DOB: 02-Aug-1945, 71 y.o.   MRN: [id number]  HPI [name] is a 71 year old white female back in for recheck of vaginal itching and is 100 % better she says.   Review of Systems Feels 100% better, only occasional itch Has urge incontinence, esp  in am  Reviewed past medical,surgical, social and family history. Reviewed medications and allergies.     Objective:   Physical Exam BP 138/70 (BP Location: Left Arm, Patient Position: Sitting, Cuff Size: Normal)   Pulse 82   Resp 16   Ht 5\' 2"  (1.575 m)   Wt 187 lb 8 oz (85 kg)   SpO2 99%   BMI 34.29 kg/m   Skin warm and dry.Exteranl genitalia is lees red and less bruising and areas on inner thighs that are white are smaller, Dr Elonda Husky for co exam.    Assessment:     1. Lichen sclerosus et atrophicus   2. Lichen planus atrophicus   3. Urge incontinence       Plan:     Continue triamcinolone cream Will add nystatin cream to it and use twice daily prn Meds ordered this encounter  Medications  . nystatin cream (MYCOSTATIN)    Sig: Apply 1 application topically 2 (two) times daily.    Dispense:  30 g    Refill:  3    Order Specific Question:   Supervising Provider    Answer:   EURE, LUTHER H [[id number]]  . oxybutynin (DITROPAN-XL) 5 MG 24 hr tablet    Sig: Take 1 tablet (5 mg total) by mouth at bedtime.    Dispense:  30 tablet    Refill:  6    Order Specific Question:   Supervising Provider    Answer:   Tania Ade H [[id number]]   F/U in 4 months or before if needed

## 2017-01-15 DIAGNOSIS — I495 Sick sinus syndrome: Secondary | ICD-10-CM | POA: Diagnosis not present

## 2017-01-15 DIAGNOSIS — Z45018 Encounter for adjustment and management of other part of cardiac pacemaker: Secondary | ICD-10-CM | POA: Diagnosis not present

## 2017-01-15 DIAGNOSIS — Z950 Presence of cardiac pacemaker: Secondary | ICD-10-CM | POA: Diagnosis not present

## 2017-01-16 ENCOUNTER — Ambulatory Visit: Payer: PPO | Admitting: Endocrinology

## 2017-02-10 DIAGNOSIS — E039 Hypothyroidism, unspecified: Secondary | ICD-10-CM | POA: Diagnosis not present

## 2017-02-17 ENCOUNTER — Other Ambulatory Visit (HOSPITAL_COMMUNITY): Payer: Self-pay | Admitting: Endocrinology

## 2017-02-17 DIAGNOSIS — I129 Hypertensive chronic kidney disease with stage 1 through stage 4 chronic kidney disease, or unspecified chronic kidney disease: Secondary | ICD-10-CM | POA: Diagnosis not present

## 2017-02-17 DIAGNOSIS — E0590 Thyrotoxicosis, unspecified without thyrotoxic crisis or storm: Secondary | ICD-10-CM

## 2017-02-17 DIAGNOSIS — E039 Hypothyroidism, unspecified: Secondary | ICD-10-CM | POA: Diagnosis not present

## 2017-02-17 DIAGNOSIS — E1122 Type 2 diabetes mellitus with diabetic chronic kidney disease: Secondary | ICD-10-CM | POA: Diagnosis not present

## 2017-02-17 DIAGNOSIS — E785 Hyperlipidemia, unspecified: Secondary | ICD-10-CM | POA: Diagnosis not present

## 2017-02-24 ENCOUNTER — Encounter (HOSPITAL_COMMUNITY): Payer: PPO

## 2017-02-24 ENCOUNTER — Encounter (HOSPITAL_COMMUNITY)
Admission: RE | Admit: 2017-02-24 | Discharge: 2017-02-24 | Disposition: A | Discharge status: Home / Self Care | Payer: PPO | Source: Ambulatory Visit | Attending: Endocrinology | Admitting: Endocrinology

## 2017-02-24 DIAGNOSIS — E0590 Thyrotoxicosis, unspecified without thyrotoxic crisis or storm: Secondary | ICD-10-CM

## 2017-02-24 MED ORDER — SODIUM IODIDE I 131 CAPSULE
10.0000 | Freq: Once | INTRAVENOUS | Status: AC | PRN
  Administered 2017-02-24: 10 via ORAL

## 2017-02-25 ENCOUNTER — Encounter (HOSPITAL_COMMUNITY)
Admission: RE | Admit: 2017-02-25 | Discharge: 2017-02-25 | Disposition: A | Discharge status: Home / Self Care | Payer: PPO | Source: Ambulatory Visit | Attending: Endocrinology | Admitting: Endocrinology

## 2017-02-25 DIAGNOSIS — E0590 Thyrotoxicosis, unspecified without thyrotoxic crisis or storm: Secondary | ICD-10-CM | POA: Diagnosis not present

## 2017-02-25 MED ORDER — SODIUM PERTECHNETATE TC 99M INJECTION
10.0000 | Freq: Once | INTRAVENOUS | Status: AC | PRN
  Administered 2017-02-25: 10 via INTRAVENOUS

## 2017-03-19 DIAGNOSIS — E559 Vitamin D deficiency, unspecified: Secondary | ICD-10-CM | POA: Diagnosis not present

## 2017-03-19 DIAGNOSIS — I129 Hypertensive chronic kidney disease with stage 1 through stage 4 chronic kidney disease, or unspecified chronic kidney disease: Secondary | ICD-10-CM | POA: Diagnosis not present

## 2017-03-19 DIAGNOSIS — G609 Hereditary and idiopathic neuropathy, unspecified: Secondary | ICD-10-CM | POA: Diagnosis not present

## 2017-03-19 DIAGNOSIS — E785 Hyperlipidemia, unspecified: Secondary | ICD-10-CM | POA: Diagnosis not present

## 2017-03-19 DIAGNOSIS — E039 Hypothyroidism, unspecified: Secondary | ICD-10-CM | POA: Diagnosis not present

## 2017-03-19 DIAGNOSIS — E1122 Type 2 diabetes mellitus with diabetic chronic kidney disease: Secondary | ICD-10-CM | POA: Diagnosis not present

## 2017-03-21 DIAGNOSIS — M2242 Chondromalacia patellae, left knee: Secondary | ICD-10-CM | POA: Diagnosis not present

## 2017-03-22 DIAGNOSIS — M2242 Chondromalacia patellae, left knee: Secondary | ICD-10-CM | POA: Diagnosis not present

## 2017-04-24 DIAGNOSIS — L401 Generalized pustular psoriasis: Secondary | ICD-10-CM | POA: Diagnosis not present

## 2017-05-04 DIAGNOSIS — E119 Type 2 diabetes mellitus without complications: Secondary | ICD-10-CM | POA: Diagnosis not present

## 2017-05-04 DIAGNOSIS — Z794 Long term (current) use of insulin: Secondary | ICD-10-CM | POA: Diagnosis not present

## 2017-05-12 ENCOUNTER — Encounter (INDEPENDENT_AMBULATORY_CARE_PROVIDER_SITE_OTHER): Payer: Self-pay

## 2017-05-12 ENCOUNTER — Ambulatory Visit: Payer: Medicare HMO | Admitting: Adult Health

## 2017-05-12 ENCOUNTER — Encounter: Payer: Self-pay | Admitting: Adult Health

## 2017-05-12 VITALS — BP 138/80 | HR 68 | Resp 16 | Ht 62.0 in | Wt 195.0 lb

## 2017-05-12 DIAGNOSIS — L439 Lichen planus, unspecified: Secondary | ICD-10-CM | POA: Diagnosis not present

## 2017-05-12 DIAGNOSIS — L900 Lichen sclerosus et atrophicus: Secondary | ICD-10-CM | POA: Diagnosis not present

## 2017-05-12 MED ORDER — TRIAMCINOLONE ACETONIDE 0.5 % EX OINT: 1.0000 "application " | TOPICAL_OINTMENT | Freq: Two times a day (BID) | CUTANEOUS | 1 refills | Status: DC

## 2017-05-12 NOTE — Progress Notes (Signed)
  Subjective:     Patient ID: [name], female   DOB: 08-14-1945, 72 y.o.   MRN: [id number]  HPI [name] is a 72 year old white female, back in follow up of vagina itching and she says she is good,no itching or complaints.   Review of Systems Feels good, no itching Reviewed past medical,surgical, social and family history. Reviewed medications and allergies.     Objective:   Physical Exam BP 138/80 (BP Location: Left Arm, Patient Position: Sitting, Cuff Size: Normal)   Pulse 68   Resp 16   Ht 5\' 2"  (1.575 m)   Wt 195 lb (88.5 kg)   BMI 35.67 kg/m    Skin warm and dry,External genitalia look normal, no redness or bruising like before and white area resolved on thigh.Skin is thin at introitus.  Assessment:     1. Lichen sclerosus et atrophicus   2. Lichen planus atrophicus       Plan:     Continue kenalog prn Meds ordered this encounter  Medications  . triamcinolone ointment (KENALOG) 0.5 %    Sig: Apply 1 application topically 2 (two) times daily.    Dispense:  30 g    Refill:  1    Order Specific Question:   Supervising Provider    Answer:   Tania Ade H [[id number]]  F/U prn

## 2017-05-29 DIAGNOSIS — L401 Generalized pustular psoriasis: Secondary | ICD-10-CM | POA: Diagnosis not present

## 2017-06-01 DIAGNOSIS — E1122 Type 2 diabetes mellitus with diabetic chronic kidney disease: Secondary | ICD-10-CM | POA: Diagnosis not present

## 2017-06-01 DIAGNOSIS — I129 Hypertensive chronic kidney disease with stage 1 through stage 4 chronic kidney disease, or unspecified chronic kidney disease: Secondary | ICD-10-CM | POA: Diagnosis not present

## 2017-06-01 DIAGNOSIS — E559 Vitamin D deficiency, unspecified: Secondary | ICD-10-CM | POA: Diagnosis not present

## 2017-06-01 DIAGNOSIS — E785 Hyperlipidemia, unspecified: Secondary | ICD-10-CM | POA: Diagnosis not present

## 2017-06-01 DIAGNOSIS — E039 Hypothyroidism, unspecified: Secondary | ICD-10-CM | POA: Diagnosis not present

## 2017-06-03 DIAGNOSIS — I4891 Unspecified atrial fibrillation: Secondary | ICD-10-CM | POA: Diagnosis not present

## 2017-06-03 DIAGNOSIS — I129 Hypertensive chronic kidney disease with stage 1 through stage 4 chronic kidney disease, or unspecified chronic kidney disease: Secondary | ICD-10-CM | POA: Diagnosis not present

## 2017-06-03 DIAGNOSIS — I5032 Chronic diastolic (congestive) heart failure: Secondary | ICD-10-CM | POA: Diagnosis not present

## 2017-06-03 DIAGNOSIS — Z0000 Encounter for general adult medical examination without abnormal findings: Secondary | ICD-10-CM | POA: Diagnosis not present

## 2017-06-03 DIAGNOSIS — E1122 Type 2 diabetes mellitus with diabetic chronic kidney disease: Secondary | ICD-10-CM | POA: Diagnosis not present

## 2017-06-03 DIAGNOSIS — Z780 Asymptomatic menopausal state: Secondary | ICD-10-CM | POA: Diagnosis not present

## 2017-06-03 DIAGNOSIS — E559 Vitamin D deficiency, unspecified: Secondary | ICD-10-CM | POA: Diagnosis not present

## 2017-06-03 DIAGNOSIS — N183 Chronic kidney disease, stage 3 (moderate): Secondary | ICD-10-CM | POA: Diagnosis not present

## 2017-06-03 DIAGNOSIS — E039 Hypothyroidism, unspecified: Secondary | ICD-10-CM | POA: Diagnosis not present

## 2017-06-03 DIAGNOSIS — E785 Hyperlipidemia, unspecified: Secondary | ICD-10-CM | POA: Diagnosis not present

## 2017-06-07 ENCOUNTER — Emergency Department (HOSPITAL_COMMUNITY): Payer: Medicare HMO

## 2017-06-07 ENCOUNTER — Observation Stay (HOSPITAL_COMMUNITY)
Admission: EM | Admit: 2017-06-07 | Discharge: 2017-06-08 | Disposition: A | Discharge status: Home / Self Care | Payer: Medicare HMO | Attending: Internal Medicine | Admitting: Internal Medicine

## 2017-06-07 ENCOUNTER — Other Ambulatory Visit: Payer: Self-pay

## 2017-06-07 ENCOUNTER — Encounter (HOSPITAL_COMMUNITY): Payer: Self-pay | Admitting: Emergency Medicine

## 2017-06-07 DIAGNOSIS — Z950 Presence of cardiac pacemaker: Secondary | ICD-10-CM | POA: Diagnosis not present

## 2017-06-07 DIAGNOSIS — R0902 Hypoxemia: Secondary | ICD-10-CM | POA: Diagnosis not present

## 2017-06-07 DIAGNOSIS — I4891 Unspecified atrial fibrillation: Secondary | ICD-10-CM

## 2017-06-07 DIAGNOSIS — R0602 Shortness of breath: Secondary | ICD-10-CM | POA: Insufficient documentation

## 2017-06-07 DIAGNOSIS — G4739 Other sleep apnea: Secondary | ICD-10-CM

## 2017-06-07 DIAGNOSIS — I509 Heart failure, unspecified: Secondary | ICD-10-CM | POA: Diagnosis not present

## 2017-06-07 DIAGNOSIS — I2510 Atherosclerotic heart disease of native coronary artery without angina pectoris: Secondary | ICD-10-CM | POA: Diagnosis not present

## 2017-06-07 DIAGNOSIS — I480 Paroxysmal atrial fibrillation: Secondary | ICD-10-CM | POA: Diagnosis present

## 2017-06-07 DIAGNOSIS — Z7901 Long term (current) use of anticoagulants: Secondary | ICD-10-CM | POA: Diagnosis not present

## 2017-06-07 DIAGNOSIS — I495 Sick sinus syndrome: Secondary | ICD-10-CM | POA: Diagnosis present

## 2017-06-07 DIAGNOSIS — I5032 Chronic diastolic (congestive) heart failure: Secondary | ICD-10-CM | POA: Diagnosis not present

## 2017-06-07 DIAGNOSIS — I110 Hypertensive heart disease with heart failure: Secondary | ICD-10-CM | POA: Diagnosis not present

## 2017-06-07 DIAGNOSIS — G4731 Primary central sleep apnea: Secondary | ICD-10-CM | POA: Insufficient documentation

## 2017-06-07 DIAGNOSIS — I2583 Coronary atherosclerosis due to lipid rich plaque: Secondary | ICD-10-CM

## 2017-06-07 DIAGNOSIS — Z79899 Other long term (current) drug therapy: Secondary | ICD-10-CM | POA: Insufficient documentation

## 2017-06-07 DIAGNOSIS — R0789 Other chest pain: Secondary | ICD-10-CM | POA: Diagnosis not present

## 2017-06-07 DIAGNOSIS — E1140 Type 2 diabetes mellitus with diabetic neuropathy, unspecified: Secondary | ICD-10-CM | POA: Diagnosis not present

## 2017-06-07 DIAGNOSIS — I5033 Acute on chronic diastolic (congestive) heart failure: Secondary | ICD-10-CM | POA: Diagnosis not present

## 2017-06-07 DIAGNOSIS — E119 Type 2 diabetes mellitus without complications: Secondary | ICD-10-CM | POA: Diagnosis not present

## 2017-06-07 DIAGNOSIS — Z794 Long term (current) use of insulin: Secondary | ICD-10-CM | POA: Insufficient documentation

## 2017-06-07 HISTORY — DX: Presence of cardiac pacemaker: Z95.0

## 2017-06-07 LAB — CBC
HEMATOCRIT: 41.7 % (ref 36.0–46.0)
HEMOGLOBIN: 13.3 g/dL (ref 12.0–15.0)
MCH: 29.9 pg (ref 26.0–34.0)
MCHC: 31.9 g/dL (ref 30.0–36.0)
MCV: 93.7 fL (ref 78.0–100.0)
Platelets: 172 10*3/uL (ref 150–400)
RBC: 4.45 MIL/uL (ref 3.87–5.11)
RDW: 15.6 % — AB (ref 11.5–15.5)
WBC: 8.1 10*3/uL (ref 4.0–10.5)

## 2017-06-07 LAB — TROPONIN I
TROPONIN I: 0.03 ng/mL — AB (ref <0.03)
TROPONIN I: 0.04 ng/mL — AB (ref <0.03)

## 2017-06-07 LAB — BASIC METABOLIC PANEL
ANION GAP: 8 (ref 5–15)
BUN: 16 mg/dL (ref 6–20)
CHLORIDE: 105 mmol/L (ref 101–111)
CO2: 26 mmol/L (ref 22–32)
Calcium: 9.4 mg/dL (ref 8.9–10.3)
Creatinine, Ser: 1.01 mg/dL — ABNORMAL HIGH (ref 0.44–1.00)
GFR calc Af Amer: >60 mL/min (ref >60)
GFR calc non Af Amer: 53 mL/min — ABNORMAL LOW (ref >60)
Glucose, Bld: 136 mg/dL — ABNORMAL HIGH (ref 65–99)
POTASSIUM: 4.4 mmol/L (ref 3.5–5.1)
SODIUM: 139 mmol/L (ref 135–145)

## 2017-06-07 LAB — CBG MONITORING, ED: GLUCOSE-CAPILLARY: 100 mg/dL — AB (ref 65–99)

## 2017-06-07 LAB — GLUCOSE, CAPILLARY
GLUCOSE-CAPILLARY: 144 mg/dL — AB (ref 65–99)
Glucose-Capillary: 126 mg/dL — ABNORMAL HIGH (ref 65–99)
Glucose-Capillary: 155 mg/dL — ABNORMAL HIGH (ref 65–99)

## 2017-06-07 LAB — BRAIN NATRIURETIC PEPTIDE: B NATRIURETIC PEPTIDE 5: 235 pg/mL — AB (ref 0.0–100.0)

## 2017-06-07 LAB — I-STAT TROPONIN, ED: Troponin i, poc: 0.03 ng/mL (ref 0.00–0.08)

## 2017-06-07 MED ORDER — FUROSEMIDE 10 MG/ML IJ SOLN
60.0000 mg | Freq: Once | INTRAMUSCULAR | Status: AC
  Administered 2017-06-07: 60 mg via INTRAVENOUS
  Filled 2017-06-07: qty 6

## 2017-06-07 MED ORDER — METOPROLOL TARTRATE 50 MG PO TABS
50.0000 mg | ORAL_TABLET | Freq: Two times a day (BID) | ORAL | Status: DC
  Administered 2017-06-07 – 2017-06-08 (×3): 50 mg via ORAL
  Filled 2017-06-07 (×3): qty 1

## 2017-06-07 MED ORDER — SPIRONOLACTONE 25 MG PO TABS
25.0000 mg | ORAL_TABLET | ORAL | Status: DC
  Administered 2017-06-07 – 2017-06-08 (×2): 25 mg via ORAL
  Filled 2017-06-07 (×2): qty 1

## 2017-06-07 MED ORDER — FUROSEMIDE 10 MG/ML IJ SOLN
60.0000 mg | Freq: Once | INTRAMUSCULAR | Status: AC
Start: 2017-06-07 — End: 2017-06-07
  Administered 2017-06-07: 60 mg via INTRAVENOUS
  Filled 2017-06-07: qty 6

## 2017-06-07 MED ORDER — ACETAMINOPHEN 650 MG RE SUPP: 650.0000 mg | Freq: Four times a day (QID) | RECTAL | Status: DC | PRN

## 2017-06-07 MED ORDER — SENNOSIDES-DOCUSATE SODIUM 8.6-50 MG PO TABS: 1.0000 | ORAL_TABLET | Freq: Every evening | ORAL | Status: DC | PRN

## 2017-06-07 MED ORDER — SIMVASTATIN 10 MG PO TABS
20.0000 mg | ORAL_TABLET | Freq: Every evening | ORAL | Status: DC
  Administered 2017-06-07: 20 mg via ORAL
  Filled 2017-06-07: qty 2

## 2017-06-07 MED ORDER — VITAMIN D 1000 UNITS PO TABS
2000.0000 [IU] | ORAL_TABLET | Freq: Every day | ORAL | Status: DC
  Administered 2017-06-07 – 2017-06-08 (×2): 2000 [IU] via ORAL
  Filled 2017-06-07 (×2): qty 2

## 2017-06-07 MED ORDER — IBUPROFEN 400 MG PO TABS
400.0000 mg | ORAL_TABLET | Freq: Four times a day (QID) | ORAL | Status: AC | PRN
  Administered 2017-06-07: 400 mg via ORAL
  Filled 2017-06-07: qty 1

## 2017-06-07 MED ORDER — HYDROCHLOROTHIAZIDE 25 MG PO TABS
25.0000 mg | ORAL_TABLET | Freq: Every day | ORAL | Status: DC
  Administered 2017-06-07 – 2017-06-08 (×2): 25 mg via ORAL
  Filled 2017-06-07 (×2): qty 1

## 2017-06-07 MED ORDER — NITROGLYCERIN 0.4 MG SL SUBL: 0.4000 mg | SUBLINGUAL_TABLET | SUBLINGUAL | Status: DC | PRN

## 2017-06-07 MED ORDER — INSULIN ASPART PROT & ASPART (70-30 MIX) 100 UNIT/ML ~~LOC~~ SUSP
35.0000 [IU] | Freq: Two times a day (BID) | SUBCUTANEOUS | Status: DC
  Administered 2017-06-07 – 2017-06-08 (×2): 35 [IU] via SUBCUTANEOUS
  Filled 2017-06-07: qty 10

## 2017-06-07 MED ORDER — INSULIN ASPART 100 UNIT/ML ~~LOC~~ SOLN: 0.0000 [IU] | Freq: Every day | SUBCUTANEOUS | Status: DC

## 2017-06-07 MED ORDER — IRBESARTAN 300 MG PO TABS
300.0000 mg | ORAL_TABLET | Freq: Every day | ORAL | Status: DC
  Administered 2017-06-07 – 2017-06-08 (×2): 300 mg via ORAL
  Filled 2017-06-07 (×2): qty 1

## 2017-06-07 MED ORDER — ACETAMINOPHEN 325 MG PO TABS
650.0000 mg | ORAL_TABLET | Freq: Four times a day (QID) | ORAL | Status: DC | PRN
  Administered 2017-06-07: 650 mg via ORAL
  Filled 2017-06-07: qty 2

## 2017-06-07 MED ORDER — ISOSORBIDE MONONITRATE ER 30 MG PO TB24
30.0000 mg | ORAL_TABLET | Freq: Every day | ORAL | Status: DC
  Administered 2017-06-07: 30 mg via ORAL
  Filled 2017-06-07: qty 1

## 2017-06-07 MED ORDER — OLMESARTAN MEDOXOMIL-HCTZ 40-25 MG PO TABS: 1.0000 | ORAL_TABLET | Freq: Every day | ORAL | Status: DC

## 2017-06-07 MED ORDER — ACETAMINOPHEN 325 MG PO TABS
650.0000 mg | ORAL_TABLET | Freq: Once | ORAL | Status: AC
  Administered 2017-06-07: 650 mg via ORAL
  Filled 2017-06-07: qty 2

## 2017-06-07 MED ORDER — OMEGA-3-ACID ETHYL ESTERS 1 G PO CAPS
2.0000 | ORAL_CAPSULE | Freq: Two times a day (BID) | ORAL | Status: DC
  Administered 2017-06-07 – 2017-06-08 (×3): 2 g via ORAL
  Filled 2017-06-07 (×4): qty 2

## 2017-06-07 MED ORDER — APIXABAN 5 MG PO TABS
5.0000 mg | ORAL_TABLET | Freq: Two times a day (BID) | ORAL | Status: DC
  Administered 2017-06-07 – 2017-06-08 (×3): 5 mg via ORAL
  Filled 2017-06-07 (×3): qty 1

## 2017-06-07 MED ORDER — OXYBUTYNIN CHLORIDE ER 5 MG PO TB24
5.0000 mg | ORAL_TABLET | Freq: Every day | ORAL | Status: DC
  Administered 2017-06-07: 5 mg via ORAL
  Filled 2017-06-07: qty 1

## 2017-06-07 MED ORDER — ONDANSETRON HCL 4 MG/2ML IJ SOLN: 4.0000 mg | Freq: Four times a day (QID) | INTRAMUSCULAR | Status: DC | PRN

## 2017-06-07 MED ORDER — FENOFIBRATE 160 MG PO TABS
160.0000 mg | ORAL_TABLET | Freq: Every day | ORAL | Status: DC
  Administered 2017-06-07: 160 mg via ORAL
  Filled 2017-06-07: qty 1

## 2017-06-07 MED ORDER — NITROGLYCERIN 2 % TD OINT
1.0000 [in_us] | TOPICAL_OINTMENT | Freq: Once | TRANSDERMAL | Status: AC
  Administered 2017-06-07: 1 [in_us] via TOPICAL
  Filled 2017-06-07: qty 1

## 2017-06-07 MED ORDER — INSULIN ASPART 100 UNIT/ML ~~LOC~~ SOLN
0.0000 [IU] | Freq: Three times a day (TID) | SUBCUTANEOUS | Status: DC
  Administered 2017-06-07 – 2017-06-08 (×3): 2 [IU] via SUBCUTANEOUS

## 2017-06-07 MED ORDER — ONDANSETRON HCL 4 MG PO TABS: 4.0000 mg | ORAL_TABLET | Freq: Four times a day (QID) | ORAL | Status: DC | PRN

## 2017-06-07 NOTE — ED Notes (Addendum)
Ambulated pt with pulse ox, sats started at 94% on RA and dropped to 87%. HR in 70-80s. EDP notified.

## 2017-06-07 NOTE — ED Triage Notes (Signed)
Pt with c/o chest pain since 2400. Pt took four baby ASA prior to EMS arrival. Per EMS, pt was chest pain free before they got there. Pt continues to deny any chest pain but is short of breath with wheezes heard at bedside.

## 2017-06-07 NOTE — ED Provider Notes (Addendum)
[facility] EMERGENCY DEPARTMENT Provider Note   CSN: [id number] Arrival date & time: 06/07/17  0234  Time seen 03:33 AM   History   Chief Complaint Chief Complaint  Patient presents with  . Chest Pain    HPI [name] is a 72 y.o. female.  HPI patient has a history of diabetes, hypertension, congestive heart failure and has a pacemaker.  She reports Saturday, May 25 she went to the grocery store in the evening when it was very hot, in the 90s.  When she got back home she felt very short of breath and weak.  She states she had to sit in front of the fan for a while to feel better.  She states her feet have started swelling and she feels like her abdomen is swollen and tight today.  About 12 midnight she started getting "twinges" in her chest that was sharp and lasted few minutes at a time.  She had 2-3 episodes.  She has had them before with uncertain diagnosis.  She also states she felt short of breath and felt like her heart was beating fast, she felt lightheaded.  She also felt like she is wheezing which she has had in the past but she is not on a inhaler or nebulizer.  She states her last episode of congestive heart failure was at least 3 years ago.  Patient was brought in by EMS and chewed 4 baby aspirin at home.  EMS placed her on oxygen and she states she slowly felt better after that.  Patient states she used to be on Lasix however Dr. Einar Gip, her cardiologist had her stop it and now she only takes it as needed.  PCP Brunetta Jeans, PA-C Cardiology Dr Gwendel Hanson  Past Medical History:  Diagnosis Date  . Arthritis   . CHF (congestive heart failure) ([place])   . Diabetes mellitus without complication ([place])   . History of chickenpox   . Hx of psoriatic arthritis   . Hyperlipemia   . Hypertension   . Mitral valve disorders(424.0)   . Obstructive sleep apnea   . Pacemaker   . Peripheral neuropathy   . Renal disorder Kidney disease stage2  . Thyroid disease hypothyroid  .  Urine incontinence   . Vitamin D deficiency     Patient Active Problem List   Diagnosis Date Noted  . History of UTI 12/24/2016  . Lichen planus atrophicus 12/16/2016  . Vulvovaginal candidiasis 12/16/2016  . Itching in the vaginal area 10/11/2016  . Pruritus 10/11/2016  . Paroxysmal atrial fibrillation ([place]) 10/11/2016  . Pseudophakia 03/11/2016  . Tachycardia-bradycardia syndrome ([place]) 04/29/2015  . Complex sleep apnea syndrome 04/29/2014  . CAD (coronary artery disease) 04/29/2014  . Moderate mitral regurgitation 04/29/2014  . Diabetes mellitus without complication ([place]) 97/35/3299  . Chronic diastolic heart failure ([place]) 05/30/2012  . OBESITY 08/02/2008  . DIVERTICULOSIS OF COLON 08/02/2008    Past Surgical History:  Procedure Laterality Date  . CARDIAC CATHETERIZATION N/A 04/29/2015   Procedure: Temporary Pacemaker;  Surgeon: Adrian Prows, MD;  Location: [place] [facility];  Service: Cardiovascular;  Laterality: N/A;  . EP IMPLANTABLE DEVICE N/A 04/30/2015   Procedure: Pacemaker Implant;  Surgeon: Evans Lance, MD;  Location: [place] [facility];  Service: Cardiovascular;  Laterality: N/A;  . LEFT AND RIGHT HEART CATHETERIZATION WITH CORONARY ANGIOGRAM N/A 09/30/2011   Procedure: LEFT AND RIGHT HEART CATHETERIZATION WITH CORONARY ANGIOGRAM;  Surgeon: Laverda Page, MD;  Location: [facility] CATH LAB;  Service: Cardiovascular;  Laterality: N/A;  . YAG LASER APPLICATION Right 2/97/9892   Procedure: YAG LASER APPLICATION;  Surgeon: Elta Guadeloupe T. Gershon Crane, MD;  Location: [facility];  Service: Ophthalmology;  Laterality: Right;  pt knows to arrive at 11:15  . YAG LASER APPLICATION Left 01/14/9415   Procedure: YAG LASER APPLICATION;  Surgeon: Rutherford Guys, MD;  Location: [facility];  Service: Ophthalmology;  Laterality: Left;     OB History    Gravida  3   Para  3   Term      Preterm      AB      Living  3     SAB      TAB      Ectopic      Multiple      Live Births                Home Medications    Prior to Admission medications   Medication Sig Start Date End Date Taking? Authorizing Provider  apixaban (ELIQUIS) 5 MG TABS tablet Take 1 tablet (5 mg total) by mouth 2 (two) times daily. 05/02/15  Yes Rai, Ripudeep K, MD  cholecalciferol (VITAMIN D) 1000 UNITS tablet Take 2,000 Units by mouth daily.    Yes [provider]  fenofibrate 160 MG tablet Take 160 mg by mouth daily with supper.    Yes [provider]  fluconazole (DIFLUCAN) 100 MG tablet Take 1 daily for 14 days 12/29/16  Yes Derrek Monaco A, NP  insulin NPH-regular Human (NOVOLIN 70/30) (70-30) 100 UNIT/ML injection Inject 35-75 Units into the skin 2 (two) times daily with a meal. Inject 75 units subcutaneously with breakfast and 35 units with supper   Yes [provider]  isosorbide mononitrate (IMDUR) 30 MG 24 hr tablet Take 1 tablet (30 mg total) by mouth at bedtime. 10/01/15  Yes Dixie Dials, MD  metFORMIN (GLUCOPHAGE) 500 MG tablet Take 1,000 mg by mouth 2 (two) times daily with a meal.   Yes [provider]  metoprolol tartrate (LOPRESSOR) 50 MG tablet Take 1 tablet (50 mg total) by mouth 2 (two) times daily. 10/01/15  Yes Dixie Dials, MD  nitroGLYCERIN (NITROSTAT) 0.4 MG SL tablet Place 1 tablet (0.4 mg total) under the tongue every 5 (five) minutes as needed for chest pain. 10/01/15  Yes Dixie Dials, MD  nystatin cream (MYCOSTATIN) Apply 1 application topically 2 (two) times daily. 01/12/17  Yes Derrek Monaco A, NP  olmesartan-hydrochlorothiazide (BENICAR HCT) 40-25 MG tablet Take 1 tablet daily by mouth. 11/05/16  Yes [provider]  omega-3 acid ethyl esters (LOVAZA) 1 g capsule Take 2 capsules 2 (two) times daily by mouth. 11/16/16  Yes [provider]  oxybutynin (DITROPAN-XL) 5 MG 24 hr tablet Take 1 tablet (5 mg total) by mouth at bedtime. 01/12/17  Yes Derrek Monaco A, NP  simvastatin (ZOCOR) 20 MG tablet Take 1 tablet every  evening by mouth. 11/03/16  Yes [provider]  spironolactone (ALDACTONE) 25 MG tablet Take 1 tablet every morning by mouth. 11/04/16  Yes [provider]  triamcinolone ointment (KENALOG) 0.5 % Apply 1 application topically 2 (two) times daily. 05/12/17  Yes Estill Dooms, NP    Family History Family History  Problem Relation Age of Onset  . Arthritis Mother   . Diabetes Mother   . Heart disease Mother   . Hyperlipidemia Mother   . Hypertension Mother   . Arthritis Father   . Asthma Father   .  Heart attack Father   . Hyperlipidemia Father   . Hypertension Father   . Stroke Father   . Arthritis Sister   . Diabetes Sister   . Hypertension Sister   . Arthritis Brother   . Heart attack Brother   . Heart disease Brother   . Hyperlipidemia Brother   . Hypertension Brother   . Alcohol abuse Brother   . Arthritis Brother   . Diabetes Brother   . Early death Brother   . Heart disease Brother   . Hyperlipidemia Brother   . Hypertension Brother   . Arthritis Sister   . Cancer Sister   . Diabetes Sister   . Hyperlipidemia Sister   . Hypertension Sister   . Stroke Sister   . Cancer Sister   . Hyperlipidemia Sister   . Hypertension Sister   . Hypertension Daughter     Social History Social History   Tobacco Use  . Smoking status: Never Smoker  . Smokeless tobacco: Never Used  Substance Use Topics  . Alcohol use: No    Alcohol/week: 0.0 oz  . Drug use: No  lives with spouse   Allergies   Hydrocodone; Invokana [canagliflozin]; and Oxycodone   Review of Systems Review of Systems  All other systems reviewed and are negative.    Physical Exam Updated Vital Signs BP (!) 153/60   Pulse 70   Temp 97.9 F (36.6 C) (Oral)   Resp 17   Ht 5' 1.5" (1.562 m)   Wt 87.7 kg (193 lb 5 oz)   SpO2 91%   BMI 35.93 kg/m   Physical Exam  Constitutional: She is oriented to person, place, and time. She appears well-developed and well-nourished.   Non-toxic appearance. She does not appear ill. No distress.  HENT:  Head: Normocephalic and atraumatic.  Right Ear: External ear normal.  Left Ear: External ear normal.  Nose: Nose normal. No mucosal edema or rhinorrhea.  Mouth/Throat: Oropharynx is clear and moist and mucous membranes are normal. No dental abscesses or uvula swelling.  Eyes: Pupils are equal, round, and reactive to light. Conjunctivae and EOM are normal.  Neck: Normal range of motion and full passive range of motion without pain. Neck supple.  Cardiovascular: Normal rate, regular rhythm and normal heart sounds. Exam reveals no gallop and no friction rub.  No murmur heard. Pulmonary/Chest: Effort normal. No respiratory distress. She has decreased breath sounds. She has no wheezes. She has no rhonchi. She has rales in the right lower field and the left lower field. She exhibits no tenderness and no crepitus.  Abdominal: Soft. Normal appearance and bowel sounds are normal. She exhibits distension. There is no tenderness. There is no rebound and no guarding.  Musculoskeletal: Normal range of motion. She exhibits no edema or tenderness.  Moves all extremities well.   Neurological: She is alert and oriented to person, place, and time. She has normal strength. No cranial nerve deficit.  Skin: Skin is warm, dry and intact. No rash noted. No erythema. No pallor.  Psychiatric: She has a normal mood and affect. Her speech is normal and behavior is normal. Her mood appears not anxious.  Nursing note and vitals reviewed.    ED Treatments / Results  Labs (all labs ordered are listed, but only abnormal results are displayed) Results for orders placed or performed during the hospital encounter of 16/10/96  Basic metabolic panel  Result Value Ref Range   Sodium 139 135 - 145 mmol/L   Potassium 4.4  3.5 - 5.1 mmol/L   Chloride 105 101 - 111 mmol/L   CO2 26 22 - 32 mmol/L   Glucose, Bld 136 (H) 65 - 99 mg/dL   BUN 16 6 - 20 mg/dL    Creatinine, Ser 1.01 (H) 0.44 - 1.00 mg/dL   Calcium 9.4 8.9 - 10.3 mg/dL   GFR calc non Af Amer 53 (L) >60 mL/min   GFR calc Af Amer >60 >60 mL/min   Anion gap 8 5 - 15  CBC  Result Value Ref Range   WBC 8.1 4.0 - 10.5 K/uL   RBC 4.45 3.87 - 5.11 MIL/uL   Hemoglobin 13.3 12.0 - 15.0 g/dL   HCT 41.7 36.0 - 46.0 %   MCV 93.7 78.0 - 100.0 fL   MCH 29.9 26.0 - 34.0 pg   MCHC 31.9 30.0 - 36.0 g/dL   RDW 15.6 (H) 11.5 - 15.5 %   Platelets 172 150 - 400 K/uL  Brain natriuretic peptide  Result Value Ref Range   B Natriuretic Peptide 235.0 (H) 0.0 - 100.0 pg/mL  Troponin I  Result Value Ref Range   Troponin I 0.03 (HH) <0.03 ng/mL  Troponin I  Result Value Ref Range   Troponin I 0.04 (HH) <0.03 ng/mL  I-stat troponin, ED  Result Value Ref Range   Troponin i, poc 0.03 0.00 - 0.08 ng/mL   Comment 3           Laboratory interpretation all normal except borderline troponin, minimally elevated BNP but higher than priors I can see   EKG EKG Interpretation  Date/Time:  Sunday Jun 07 2017 02:37:56 EDT Ventricular Rate:  74 PR Interval:    QRS Duration: 112 QT Interval:  382 QTC Calculation: 424 R Axis:   -56 Text Interpretation:  Atrial-paced rhythm LAD, consider left anterior fascicular block Anterior infarct, old Borderline repolarization abnormality No significant change since last tracing 18 Oct 2015 Confirmed by Rolland Porter [phone]) on 06/07/2017 3:00:25 AM   Radiology Dg Chest 2 View  Result Date: 06/07/2017 CLINICAL DATA:  Shortness of breath EXAM: CHEST - 2 VIEW COMPARISON:  Chest radiograph 10/18/2015 FINDINGS: There is a leftchest wall pacemakerwith leads projecting within the right atrium and right ventricle. The lungs are well inflated. There is mild cardiomegaly. There is moderate interstitial pulmonary edema. No focal airspace consolidation. There is no pleural effusion or pneumothorax. IMPRESSION: Mild cardiomegaly with moderate pulmonary edema. Electronically Signed    By: Ulyses Jarred M.D.   On: 06/07/2017 03:01    Procedures .Critical Care Performed by: Rolland Porter, MD Authorized by: Rolland Porter, MD   Critical care provider statement:    Critical care time (minutes):  31   Critical care was necessary to treat or prevent imminent or life-threatening deterioration of the following conditions:  Cardiac failure and respiratory failure   Critical care was time spent personally by me on the following activities:  Discussions with consultants, examination of patient, obtaining history from patient or surrogate, ordering and review of laboratory studies, ordering and review of radiographic studies, pulse oximetry and re-evaluation of patient's condition   (including critical care time)  Medications Ordered in ED Medications  furosemide (LASIX) injection 60 mg (60 mg Intravenous Given 06/07/17 0352)  nitroGLYCERIN (NITROGLYN) 2 % ointment 1 inch (1 inch Topical Given 06/07/17 0356)  acetaminophen (TYLENOL) tablet 650 mg (650 mg Oral Given 06/07/17 0350)     Initial Impression / Assessment and Plan / ED Course  I have reviewed the  triage vital signs and the nursing notes.  Pertinent labs & imaging results that were available during my care of the patient were reviewed by me and considered in my medical decision making (see chart for details).     Patient was given IV Lasix and nitroglycerin paste for her hypertension and her congestive heart failure.  Recheck at 6 AM she reports good urinary output after the Lasix.  She states her breathing is much easier.  At 6:05AM  I turned off her oxygen to see how she would do.  We discussed her borderline first positive troponin and need to do a second 1.  After her second troponin resulted which was basically unchanged went to talk to the patient, she states she is feeling much improved.  I am going to have nursing staff ambulator with a pulse ox to make sure she does not become hypoxic.  08:37:01 ED Notes CK    Ambulated pt with pulse ox, sats started at 94% on RA and dropped to 87%. HR in 70-80s.        8:45 AM I talked to the patient and her daughter.  She does become hypoxic with exertion.  We discussed that she really should stay in the hospital and she is reluctantly agreeable.  08:52 AM Dr Reesa Chew, hospitalist, will admit  Final Clinical Impressions(s) / ED Diagnoses   Final diagnoses:  Acute on chronic congestive heart failure, unspecified heart failure type [facility])  Hypoxia    Plan admission  Rolland Porter, MD, Barbette Or, MD 06/07/17 Letts, Hyrum, MD 06/07/17 5726    Rolland Porter, MD 06/15/17 2126

## 2017-06-07 NOTE — ED Notes (Signed)
Date and time results received: now (use smartphrase ".now" to insert current time)  Test: Troponin Critical Value: 0.03 Name of Provider Notified: Tomi Bamberger Orders Received?None yet

## 2017-06-07 NOTE — H&P (Signed)
History and Physical    [name] VOH:[id number] DOB: 05-Apr-1945 DOA: 06/07/2017  PCP: Brunetta Jeans, PA-C Patient coming from: Home  Chief Complaint: Home  HPI: [name] is a 73 y.o. female with medical history significant of diastolic CHF, tachybradycardia syndrome, paroxysmal atrial fibrillation status post permanent pacemaker, essential hypertension, diabetes mellitus type 2, hyperlipidemia came to the hospital with complaints of shortness of breath.  Patient states she has been feeling progressive shortness of breath over the course of past several months along with fatigue for which she has seen cardiologist in the past.  But yesterday she noticed increasing bilateral lower extremity swelling and worsening of her shortness of breath especially when laying flat.  During this time she also had some atypical pressure-like chest pain but quickly resolved.  She used to be on Lasix but no longer takes it regularly.  She called EMS who advised her to take 4 tablets of aspirin and was brought to the hospital for further evaluation.  In the ER most of the patient's vital signs were unremarkable.  Slightly elevated BNP, EKG showed paced rhythm without any acute ST-T changes.  Chest x-ray was concerning for moderate pulmonary edema.  While ambulating her her oxygen levels dropped to almost 85% on room air.  Due to signs of some fluid overload and hypoxia it was determined to admit her for some diuresis.  In the ER she received 60 mg of IV Lasix and put out close to over a liter and a half of urine which greatly improved her symptoms.  By the time I saw her she felt much more comfortable and was able to lay flat.   Review of Systems: As per HPI otherwise 10 point review of systems negative.  Review of Systems Otherwise negative except as per HPI, including: General: Denies fever, chills, night sweats or unintended weight loss. Resp: Denies cough Cardiac: Denies chest pain,  palpitations GI: Denies abdominal pain, nausea, vomiting, diarrhea or constipation GU: Denies dysuria, frequency, hesitancy or incontinence MS: Denies muscle aches, joint pain  Neuro: Denies headache, neurologic deficits (focal weakness, numbness, tingling), abnormal gait Psych: Denies anxiety, depression, SI/HI/AVH Skin: Denies new rashes or lesions ID: Denies sick contacts, exotic exposures, travel  Past Medical History:  Diagnosis Date  . Arthritis   . CHF (congestive heart failure) ([place])   . Diabetes mellitus without complication ([place])   . History of chickenpox   . Hx of psoriatic arthritis   . Hyperlipemia   . Hypertension   . Mitral valve disorders(424.0)   . Obstructive sleep apnea   . Pacemaker   . Peripheral neuropathy   . Renal disorder Kidney disease stage2  . Thyroid disease hypothyroid  . Urine incontinence   . Vitamin D deficiency     Past Surgical History:  Procedure Laterality Date  . CARDIAC CATHETERIZATION N/A 04/29/2015   Procedure: Temporary Pacemaker;  Surgeon: Adrian Prows, MD;  Location: [facility];  Service: Cardiovascular;  Laterality: N/A;  . EP IMPLANTABLE DEVICE N/A 04/30/2015   Procedure: Pacemaker Implant;  Surgeon: Evans Lance, MD;  Location: [facility];  Service: Cardiovascular;  Laterality: N/A;  . LEFT AND RIGHT HEART CATHETERIZATION WITH CORONARY ANGIOGRAM N/A 09/30/2011   Procedure: LEFT AND RIGHT HEART CATHETERIZATION WITH CORONARY ANGIOGRAM;  Surgeon: Laverda Page, MD;  Location: [facility];  Service: Cardiovascular;  Laterality: N/A;  . YAG LASER APPLICATION Right 6/94/8546   Procedure: YAG LASER APPLICATION;  Surgeon: Elta Guadeloupe T.  Gershon Crane, MD;  Location: [facility];  Service: Ophthalmology;  Laterality: Right;  pt knows to arrive at 11:15  . YAG LASER APPLICATION Left 5/0/9326   Procedure: YAG LASER APPLICATION;  Surgeon: Rutherford Guys, MD;  Location: [facility];  Service: Ophthalmology;  Laterality: Left;    SOCIAL HISTORY:   reports that she has never smoked. She has never used smokeless tobacco. She reports that she does not drink alcohol or use drugs.  Allergies  Allergen Reactions  . Hydrocodone Other (See Comments)    lethargic  . Invokana [Canagliflozin] Other (See Comments)    Made heart race  . Oxycodone Other (See Comments)    lethargic    FAMILY HISTORY: Family History  Problem Relation Age of Onset  . Arthritis Mother   . Diabetes Mother   . Heart disease Mother   . Hyperlipidemia Mother   . Hypertension Mother   . Arthritis Father   . Asthma Father   . Heart attack Father   . Hyperlipidemia Father   . Hypertension Father   . Stroke Father   . Arthritis Sister   . Diabetes Sister   . Hypertension Sister   . Arthritis Brother   . Heart attack Brother   . Heart disease Brother   . Hyperlipidemia Brother   . Hypertension Brother   . Alcohol abuse Brother   . Arthritis Brother   . Diabetes Brother   . Early death Brother   . Heart disease Brother   . Hyperlipidemia Brother   . Hypertension Brother   . Arthritis Sister   . Cancer Sister   . Diabetes Sister   . Hyperlipidemia Sister   . Hypertension Sister   . Stroke Sister   . Cancer Sister   . Hyperlipidemia Sister   . Hypertension Sister   . Hypertension Daughter      Prior to Admission medications   Medication Sig Start Date End Date Taking? Authorizing Provider  apixaban (ELIQUIS) 5 MG TABS tablet Take 1 tablet (5 mg total) by mouth 2 (two) times daily. 05/02/15  Yes Rai, Ripudeep K, MD  cholecalciferol (VITAMIN D) 1000 UNITS tablet Take 2,000 Units by mouth daily.    Yes [provider]  fenofibrate 160 MG tablet Take 160 mg by mouth daily with supper.    Yes [provider]  fluconazole (DIFLUCAN) 100 MG tablet Take 1 daily for 14 days 12/29/16  Yes Derrek Monaco A, NP  insulin NPH-regular Human (NOVOLIN 70/30) (70-30) 100 UNIT/ML injection Inject 35-75 Units into the skin 2 (two) times daily  with a meal. Inject 75 units subcutaneously with breakfast and 35 units with supper   Yes [provider]  isosorbide mononitrate (IMDUR) 30 MG 24 hr tablet Take 1 tablet (30 mg total) by mouth at bedtime. 10/01/15  Yes Dixie Dials, MD  metFORMIN (GLUCOPHAGE) 500 MG tablet Take 1,000 mg by mouth 2 (two) times daily with a meal.   Yes [provider]  metoprolol tartrate (LOPRESSOR) 50 MG tablet Take 1 tablet (50 mg total) by mouth 2 (two) times daily. 10/01/15  Yes Dixie Dials, MD  nitroGLYCERIN (NITROSTAT) 0.4 MG SL tablet Place 1 tablet (0.4 mg total) under the tongue every 5 (five) minutes as needed for chest pain. 10/01/15  Yes Dixie Dials, MD  nystatin cream (MYCOSTATIN) Apply 1 application topically 2 (two) times daily. 01/12/17  Yes Derrek Monaco A, NP  olmesartan-hydrochlorothiazide (BENICAR HCT) 40-25 MG tablet Take 1 tablet daily by mouth. 11/05/16  Yes  [provider]  omega-3 acid ethyl esters (LOVAZA) 1 g capsule Take 2 capsules 2 (two) times daily by mouth. 11/16/16  Yes [provider]  oxybutynin (DITROPAN-XL) 5 MG 24 hr tablet Take 1 tablet (5 mg total) by mouth at bedtime. 01/12/17  Yes Derrek Monaco A, NP  pravastatin (PRAVACHOL) 10 MG tablet Take 10 mg by mouth daily.   Yes [provider]  spironolactone (ALDACTONE) 25 MG tablet Take 1 tablet every morning by mouth. 11/04/16  Yes [provider]  triamcinolone ointment (KENALOG) 0.5 % Apply 1 application topically 2 (two) times daily. 05/12/17  Yes Estill Dooms, NP    Physical Exam: Vitals:   06/07/17 0830 06/07/17 0900 06/07/17 1027 06/07/17 1028  BP: (!) 153/60 (!) 152/65 (!) 179/71   Pulse: 70 69 69 69  Resp: 17 15 18    Temp:   97.7 F (36.5 C)   TempSrc:   Oral   SpO2: 91% 96% (!) 89% 92%  Weight:   84.7 kg (186 lb 11.7 oz)   Height:   5\' 1"  (1.549 m)       Constitutional: NAD, calm, comfortable Eyes: PERRL, lids and conjunctivae  normal ENMT: Mucous membranes are moist. Posterior pharynx clear of any exudate or lesions.Normal dentition.  Neck: normal, supple, no masses, no thyromegaly Respiratory: Bibasilar crackles Cardiovascular: Regular rate and rhythm, no murmurs / rubs / gallops. No extremity edema. 2+ pedal pulses. No carotid bruits.  Abdomen: no tenderness, no masses palpated. No hepatosplenomegaly. Bowel sounds positive.  Musculoskeletal: no clubbing / cyanosis. No joint deformity upper and lower extremities. Good ROM, no contractures. Normal muscle tone.  Skin: no rashes, lesions, ulcers. No induration Neurologic: CN 2-12 grossly intact. Sensation intact, DTR normal. Strength 5/5 in all 4.  Psychiatric: Normal judgment and insight. Alert and oriented x 3. Normal mood.     Labs on Admission: I have personally reviewed following labs and imaging studies  CBC: Recent Labs  Lab 06/07/17 0308  WBC 8.1  HGB 13.3  HCT 41.7  MCV 93.7  PLT 809   Basic Metabolic Panel: Recent Labs  Lab 06/07/17 0308  NA 139  K 4.4  CL 105  CO2 26  GLUCOSE 136*  BUN 16  CREATININE 1.01*  CALCIUM 9.4   GFR: Estimated Creatinine Clearance: 48.3 mL/min (A) (by C-G formula based on SCr of 1.01 mg/dL (H)). Liver Function Tests: No results for input(s): AST, ALT, ALKPHOS, BILITOT, PROT, ALBUMIN in the last 168 hours. No results for input(s): LIPASE, AMYLASE in the last 168 hours. No results for input(s): AMMONIA in the last 168 hours. Coagulation Profile: No results for input(s): INR, PROTIME in the last 168 hours. Cardiac Enzymes: Recent Labs  Lab 06/07/17 0308 06/07/17 0719  TROPONINI 0.03* 0.04*   BNP (last 3 results) No results for input(s): PROBNP in the last 8760 hours. HbA1C: No results for input(s): HGBA1C in the last 72 hours. CBG: Recent Labs  Lab 06/07/17 0914 06/07/17 1118  GLUCAP 100* 144*   Lipid Profile: No results for input(s): CHOL, HDL, LDLCALC, TRIG, CHOLHDL, LDLDIRECT in the last 72  hours. Thyroid Function Tests: No results for input(s): TSH, T4TOTAL, FREET4, T3FREE, THYROIDAB in the last 72 hours. Anemia Panel: No results for input(s): VITAMINB12, FOLATE, FERRITIN, TIBC, IRON, RETICCTPCT in the last 72 hours. Urine analysis:    Component Value Date/Time   COLORURINE YELLOW 10/08/2016 1441   APPEARANCEUR Clear 12/24/2016 1500   LABSPEC <=1.005 (A) 10/08/2016 1441   PHURINE  6.0 10/08/2016 1441   GLUCOSEU Negative 12/24/2016 1500   GLUCOSEU NEGATIVE 10/08/2016 1441   HGBUR NEGATIVE 10/08/2016 1441   BILIRUBINUR Negative 12/24/2016 1500   KETONESUR NEGATIVE 10/08/2016 1441   PROTEINUR neg 12/29/2016 1347   PROTEINUR Negative 12/24/2016 1500   PROTEINUR NEGATIVE 04/29/2015 1930   UROBILINOGEN 0.2 11/25/2016 1431   UROBILINOGEN 0.2 10/08/2016 1441   NITRITE neg 12/29/2016 1347   NITRITE Negative 12/24/2016 1500   NITRITE NEGATIVE 10/08/2016 1441   LEUKOCYTESUR Negative 12/29/2016 1347   LEUKOCYTESUR Negative 12/24/2016 1500   Sepsis Labs: !!!!!!!!!!!!!!!!!!!!!!!!!!!!!!!!!!!!!!!!!!!! @LABRCNTIP (procalcitonin:4,lacticidven:4) )No results found for this or any previous visit (from the past 240 hour(s)).   Radiological Exams on Admission: Dg Chest 2 View  Result Date: 06/07/2017 CLINICAL DATA:  Shortness of breath EXAM: CHEST - 2 VIEW COMPARISON:  Chest radiograph 10/18/2015 FINDINGS: There is a leftchest wall pacemakerwith leads projecting within the right atrium and right ventricle. The lungs are well inflated. There is mild cardiomegaly. There is moderate interstitial pulmonary edema. No focal airspace consolidation. There is no pleural effusion or pneumothorax. IMPRESSION: Mild cardiomegaly with moderate pulmonary edema. Electronically Signed   By: Ulyses Jarred M.D.   On: 06/07/2017 03:01     All images have been reviewed by me personally.  EKG: Independently reviewed.  Paced rhythm Chest x-ray shows pulmonary edema  Assessment/Plan Principal Problem:    Acute exacerbation of CHF (congestive heart failure) (HCC) Active Problems:   Chronic diastolic heart failure (HCC)   Diabetes mellitus without complication (HCC)   Complex sleep apnea syndrome   CAD (coronary artery disease)   Tachycardia-bradycardia syndrome (HCC)   Paroxysmal atrial fibrillation (HCC)    Acute respiratory distress with mild hypoxia, 85% on room air Acute mild to moderate diastolic congestive heart failure, ejection fraction 25%, grade 3 diastolic dysfunction Atypical chest pain with history of coronary artery disease -Daily input and output, daily weight, fluid restriction - Lasix IV ordered 60mg  IV one more dose 2pm-monitor urine output and the response - Recent echocardiogram shows= 04/2015 -ejection fraction 55% with grade 3 diastolic dysfunction. Will repeat echo -EKG shows= paced rhythm -Chest x-ray shows= pulmonary vascular congestion - Trend cardiac enzymes - Closely monitor potassium and magnesium during aggressive diuresis.  Replete as necessary.  Keep potassium above 4 and magnesium above 2 -Continue other cardiac medications with caution -Supplemental oxygen as necessary -Continue home regimen of Avapro, Imdur, Lopressor and Aldactone  History of paroxysmal atrial fibrillation Tachybradycardia syndrome status post pacemaker in place - Currently patient is on metoprolol 50 mg twice daily and also has pacemaker in place.  Continue Eliquis  Essential hypertension -Continue home regimen of Avapro, Imdur, Lopressor and Aldactone  Diabetes mellitus type 2 - 70/30 insulin 35 units twice daily.  Insulin sliding scale  Hyperlipidemia -Continue statin   DVT prophylaxis: On Eliquis  Code Status: Full Code  Family Communication: Son at bedside  Disposition Plan:  TBD Consults called: None  Admission status: Obs tele    Time Spent: 65 minutes.  >50% of the time was devoted to discussing the patients care, assessment, plan and disposition with other care  givers along with counseling the patient about the risks and benefits of treatment.   Please Note: This patient record was dictated using Editor, commissioning. Chart creation errors have been sought, but may not always have been located. Such creation errors do not reflect on the Standard of Medical Care.   Dulcey Riederer Arsenio Loader MD [facility] Pager [phone]  If  7PM-7AM, please contact night-coverage [email or web address] Password [facility]  06/07/2017, 11:56 AM

## 2017-06-07 NOTE — ED Notes (Signed)
CRITICAL VALUE ALERT  Critical Value:  Troponin 0.04  Date & Time Notied:  06/07/2017 at [place]  Provider Notified: Dr. Tomi Bamberger  Orders Received/Actions taken:

## 2017-06-08 ENCOUNTER — Observation Stay (HOSPITAL_BASED_OUTPATIENT_CLINIC_OR_DEPARTMENT_OTHER): Payer: Medicare HMO

## 2017-06-08 DIAGNOSIS — I480 Paroxysmal atrial fibrillation: Secondary | ICD-10-CM | POA: Diagnosis not present

## 2017-06-08 DIAGNOSIS — I340 Nonrheumatic mitral (valve) insufficiency: Secondary | ICD-10-CM

## 2017-06-08 DIAGNOSIS — E119 Type 2 diabetes mellitus without complications: Secondary | ICD-10-CM | POA: Diagnosis not present

## 2017-06-08 DIAGNOSIS — I495 Sick sinus syndrome: Secondary | ICD-10-CM | POA: Diagnosis not present

## 2017-06-08 DIAGNOSIS — I2510 Atherosclerotic heart disease of native coronary artery without angina pectoris: Secondary | ICD-10-CM | POA: Diagnosis not present

## 2017-06-08 DIAGNOSIS — I5033 Acute on chronic diastolic (congestive) heart failure: Secondary | ICD-10-CM | POA: Diagnosis not present

## 2017-06-08 LAB — COMPREHENSIVE METABOLIC PANEL
ALBUMIN: 3.4 g/dL — AB (ref 3.5–5.0)
ALT: 30 U/L (ref 14–54)
AST: 21 U/L (ref 15–41)
Alkaline Phosphatase: 24 U/L — ABNORMAL LOW (ref 38–126)
Anion gap: 9 (ref 5–15)
BUN: 19 mg/dL (ref 6–20)
CHLORIDE: 98 mmol/L — AB (ref 101–111)
CO2: 32 mmol/L (ref 22–32)
Calcium: 10.1 mg/dL (ref 8.9–10.3)
Creatinine, Ser: 1.04 mg/dL — ABNORMAL HIGH (ref 0.44–1.00)
GFR calc Af Amer: 60 mL/min — ABNORMAL LOW (ref >60)
GFR calc non Af Amer: 52 mL/min — ABNORMAL LOW (ref >60)
GLUCOSE: 84 mg/dL (ref 65–99)
POTASSIUM: 3.7 mmol/L (ref 3.5–5.1)
SODIUM: 139 mmol/L (ref 135–145)
TOTAL PROTEIN: 7 g/dL (ref 6.5–8.1)
Total Bilirubin: 0.7 mg/dL (ref 0.3–1.2)

## 2017-06-08 LAB — CBC
HCT: 42 % (ref 36.0–46.0)
Hemoglobin: 13.2 g/dL (ref 12.0–15.0)
MCH: 29.3 pg (ref 26.0–34.0)
MCHC: 31.4 g/dL (ref 30.0–36.0)
MCV: 93.3 fL (ref 78.0–100.0)
Platelets: 171 10*3/uL (ref 150–400)
RBC: 4.5 MIL/uL (ref 3.87–5.11)
RDW: 15.4 % (ref 11.5–15.5)
WBC: 6 10*3/uL (ref 4.0–10.5)

## 2017-06-08 LAB — GLUCOSE, CAPILLARY
GLUCOSE-CAPILLARY: 91 mg/dL (ref 65–99)
GLUCOSE-CAPILLARY: 148 mg/dL — AB (ref 65–99)

## 2017-06-08 LAB — MAGNESIUM: MAGNESIUM: 1.9 mg/dL (ref 1.7–2.4)

## 2017-06-08 LAB — ECHOCARDIOGRAM COMPLETE
HEIGHTINCHES: 61 in
WEIGHTICAEL: 3104.08 [oz_av]

## 2017-06-08 MED ORDER — FUROSEMIDE 40 MG PO TABS
40.0000 mg | ORAL_TABLET | Freq: Every day | ORAL | Status: DC
  Administered 2017-06-08: 40 mg via ORAL
  Filled 2017-06-08: qty 1

## 2017-06-08 MED ORDER — FUROSEMIDE 40 MG PO TABS: 40.0000 mg | ORAL_TABLET | Freq: Every day | ORAL | 0 refills | Status: DC

## 2017-06-08 MED ORDER — OLMESARTAN MEDOXOMIL 40 MG PO TABS: 40.0000 mg | ORAL_TABLET | Freq: Every day | ORAL | 1 refills | Status: DC

## 2017-06-08 NOTE — Progress Notes (Signed)
  Echocardiogram 2D Echocardiogram has been performed.  Hamad Whyte L Androw 06/08/2017, 9:01 AM

## 2017-06-08 NOTE — Discharge Summary (Signed)
Physician Discharge Summary  [name] TKW:[id number] DOB: 1945/08/13 DOA: 06/07/2017  PCP: Brunetta Jeans, PA-C  Admit date: 06/07/2017 Discharge date: 06/08/2017  Admitted From: Home Disposition:  Home   Recommendations for Outpatient Follow-up:  1. Follow up with PCP in 1-2 weeks 2. Please obtain BMP/CBC in one week   Discharge Condition: Stable CODE STATUS: FULL Diet recommendation: Heart Healthy / Carb Modified   Brief/Interim Summary: 72 year old female with a history of diastolic CHF, tachybradycardia syndrome status post PPM, proximal asthma atrial fibrillation, hypertension, diabetes mellitus type 2, hyperlipidemia presenting with progressive shortness of breath.  Patient states that she has had progressive shortness of breath over the course of the last several months with dyspnea on exertion and fatigue.  However, on 06/06/2017, she began noticing worsening lower extremity edema and orthopnea type symptoms.  The patient states that she used to take furosemide, but she is no longer on furosemide.  She was noted to have oxygen saturation of 85% on room air with chest x-ray showing pulmonary edema.  The patient was started on intravenous furosemide with good clinical effect. The patient stated that it was imperative that she be discharged as soon as possible as she takes care of her sick husband.  As the patient was euvolemic on 06/08/2017, the patient will be discharged home with furosemide 40 mg daily.  She was instructed to follow-up with her cardiologist and PCP in 1 week.   Discharge Diagnoses:  Acute on chronic diastolic CHF -daily weights -accurate I/O's -fluid restrict -Echo--EF 65 to 70%, no WMA, mild MR, trivial TR -continue IV Lasix>> discharged with furosemide 40 mg daily -NEG 3.8 L for the admission -NEG 8  Lbs for the admission -discharge weight 186  Paroxysmal atrial fibrillation -Continue metoprolol tartrate -Continue apixaban -rate  controlled -CHADSVASc = 5  Tachybradycardia syndrome -Status post PPM placed on 04/30/2015  Diabetes mellitus type 2 -CBGs controlled -Discharge home with previous 70/30 dose of insulin and metformin  Essential hypertension -Continue ARB, metoprolol tartrate, imdur, aldactone  Hyperlipidemia -Continue Lovaza and fenofibrate      Discharge Instructions   Allergies as of 06/08/2017      Reactions   Hydrocodone Other (See Comments)   lethargic   Invokana [canagliflozin] Other (See Comments)   Made heart race   Oxycodone Other (See Comments)   lethargic      Medication List    STOP taking these medications   fluconazole 100 MG tablet Commonly known as:  DIFLUCAN   olmesartan-hydrochlorothiazide 40-25 MG tablet Commonly known as:  BENICAR HCT     TAKE these medications   apixaban 5 MG Tabs tablet Commonly known as:  ELIQUIS Take 1 tablet (5 mg total) by mouth 2 (two) times daily.   cholecalciferol 1000 units tablet Commonly known as:  VITAMIN D Take 2,000 Units by mouth daily.   fenofibrate 160 MG tablet Take 160 mg by mouth daily with supper.   furosemide 40 MG tablet Commonly known as:  LASIX Take 1 tablet (40 mg total) by mouth daily.   insulin NPH-regular Human (70-30) 100 UNIT/ML injection Commonly known as:  NOVOLIN 70/30 Inject 35-75 Units into the skin 2 (two) times daily with a meal. Inject 75 units subcutaneously with breakfast and 35 units with supper   isosorbide mononitrate 30 MG 24 hr tablet Commonly known as:  IMDUR Take 1 tablet (30 mg total) by mouth at bedtime.   metFORMIN 500 MG tablet Commonly known as:  GLUCOPHAGE Take 1,000 mg by  mouth 2 (two) times daily with a meal.   metoprolol tartrate 50 MG tablet Commonly known as:  LOPRESSOR Take 1 tablet (50 mg total) by mouth 2 (two) times daily.   nitroGLYCERIN 0.4 MG SL tablet Commonly known as:  NITROSTAT Place 1 tablet (0.4 mg total) under the tongue every 5 (five) minutes  as needed for chest pain.   nystatin cream Commonly known as:  MYCOSTATIN Apply 1 application topically 2 (two) times daily.   olmesartan 40 MG tablet Commonly known as:  BENICAR Take 1 tablet (40 mg total) by mouth daily.   omega-3 acid ethyl esters 1 g capsule Commonly known as:  LOVAZA Take 2 capsules 2 (two) times daily by mouth.   oxybutynin 5 MG 24 hr tablet Commonly known as:  DITROPAN-XL Take 1 tablet (5 mg total) by mouth at bedtime.   pravastatin 10 MG tablet Commonly known as:  PRAVACHOL Take 10 mg by mouth daily.   spironolactone 25 MG tablet Commonly known as:  ALDACTONE Take 1 tablet every morning by mouth.   triamcinolone ointment 0.5 % Commonly known as:  KENALOG Apply 1 application topically 2 (two) times daily.       Allergies  Allergen Reactions  . Hydrocodone Other (See Comments)    lethargic  . Invokana [Canagliflozin] Other (See Comments)    Made heart race  . Oxycodone Other (See Comments)    lethargic    Consultations:  none   Procedures/Studies: Dg Chest 2 View  Result Date: 06/07/2017 CLINICAL DATA:  Shortness of breath EXAM: CHEST - 2 VIEW COMPARISON:  Chest radiograph 10/18/2015 FINDINGS: There is a leftchest wall pacemakerwith leads projecting within the right atrium and right ventricle. The lungs are well inflated. There is mild cardiomegaly. There is moderate interstitial pulmonary edema. No focal airspace consolidation. There is no pleural effusion or pneumothorax. IMPRESSION: Mild cardiomegaly with moderate pulmonary edema. Electronically Signed   By: Ulyses Jarred M.D.   On: 06/07/2017 03:01        Discharge Exam: Vitals:   06/08/17 0100 06/08/17 0435  BP:  126/68  Pulse:  74  Resp:  19  Temp:  97.8 F (36.6 C)  SpO2: 93% 98%   Vitals:   06/07/17 2054 06/08/17 0100 06/08/17 0435 06/08/17 0500  BP: (!) 141/69  126/68   Pulse: 70  74   Resp: 16  19   Temp: 98.1 F (36.7 C)  97.8 F (36.6 C)   TempSrc: Oral   Oral   SpO2: 95% 93% 98%   Weight:    88 kg (194 lb 0.1 oz)  Height:        General: Pt is alert, awake, not in acute distress Cardiovascular: RRR, S1/S2 +, no rubs, no gallops Respiratory: CTA bilaterally, no wheezing, no rhonchi Abdominal: Soft, NT, ND, bowel sounds + Extremities: no edema, no cyanosis   The results of significant diagnostics from this hospitalization (including imaging, microbiology, ancillary and laboratory) are listed below for reference.    Significant Diagnostic Studies: Dg Chest 2 View  Result Date: 06/07/2017 CLINICAL DATA:  Shortness of breath EXAM: CHEST - 2 VIEW COMPARISON:  Chest radiograph 10/18/2015 FINDINGS: There is a leftchest wall pacemakerwith leads projecting within the right atrium and right ventricle. The lungs are well inflated. There is mild cardiomegaly. There is moderate interstitial pulmonary edema. No focal airspace consolidation. There is no pleural effusion or pneumothorax. IMPRESSION: Mild cardiomegaly with moderate pulmonary edema. Electronically Signed   By: Cletus Gash.D.  On: 06/07/2017 03:01     Microbiology: No results found for this or any previous visit (from the past 240 hour(s)).   Labs: Basic Metabolic Panel: Recent Labs  Lab 06/07/17 0308 06/08/17 0622  NA 139 139  K 4.4 3.7  CL 105 98*  CO2 26 32  GLUCOSE 136* 84  BUN 16 19  CREATININE 1.01* 1.04*  CALCIUM 9.4 10.1  MG  --  1.9   Liver Function Tests: Recent Labs  Lab 06/08/17 0622  AST 21  ALT 30  ALKPHOS 24*  BILITOT 0.7  PROT 7.0  ALBUMIN 3.4*   No results for input(s): LIPASE, AMYLASE in the last 168 hours. No results for input(s): AMMONIA in the last 168 hours. CBC: Recent Labs  Lab 06/07/17 0308 06/08/17 0622  WBC 8.1 6.0  HGB 13.3 13.2  HCT 41.7 42.0  MCV 93.7 93.3  PLT 172 171   Cardiac Enzymes: Recent Labs  Lab 06/07/17 0308 06/07/17 0719  TROPONINI 0.03* 0.04*   BNP: Invalid input(s): POCBNP CBG: Recent Labs  Lab  06/07/17 0914 06/07/17 1118 06/07/17 1609 06/07/17 2104 06/08/17 0728  GLUCAP 100* 144* 126* 155* 91    Time coordinating discharge:  36 minutes  Signed:  Orson Eva, DO [facility] Pager: [phone] 06/08/2017, 10:22 AM

## 2017-06-08 NOTE — Discharge Instructions (Signed)
SEE PRIMARY DOCTOR AND CARDIOLOGIST IN ONE WEEK   HAVE LABS   BMP AND CBC

## 2017-06-08 NOTE — Progress Notes (Signed)
IV removed.  Scripts sent to pharmacy.  Discharge instructions reviewed with patient and daughter. To call for follow up appt with her cardiologist and primary MD and have labs drawn.  Walked to car. Daughter to drive home

## 2017-06-10 ENCOUNTER — Telehealth: Payer: Self-pay | Admitting: Emergency Medicine

## 2017-06-10 NOTE — Telephone Encounter (Signed)
Contacted Patient to complete TCM and Schedule hospital followup, Patient states she has Changed PCP's to Dr. Shelia Media at [facility]. Patient is scheduled for Cardiologist tomorrow and encouraged to call PCP for appt.

## 2017-06-11 DIAGNOSIS — Z0189 Encounter for other specified special examinations: Secondary | ICD-10-CM | POA: Diagnosis not present

## 2017-06-11 DIAGNOSIS — I5033 Acute on chronic diastolic (congestive) heart failure: Secondary | ICD-10-CM | POA: Diagnosis not present

## 2017-06-11 DIAGNOSIS — I10 Essential (primary) hypertension: Secondary | ICD-10-CM | POA: Diagnosis not present

## 2017-06-11 DIAGNOSIS — I480 Paroxysmal atrial fibrillation: Secondary | ICD-10-CM | POA: Diagnosis not present

## 2017-06-15 DIAGNOSIS — I5033 Acute on chronic diastolic (congestive) heart failure: Secondary | ICD-10-CM | POA: Diagnosis not present

## 2017-06-18 DIAGNOSIS — E1122 Type 2 diabetes mellitus with diabetic chronic kidney disease: Secondary | ICD-10-CM | POA: Diagnosis not present

## 2017-06-18 DIAGNOSIS — E039 Hypothyroidism, unspecified: Secondary | ICD-10-CM | POA: Diagnosis not present

## 2017-06-18 DIAGNOSIS — E785 Hyperlipidemia, unspecified: Secondary | ICD-10-CM | POA: Diagnosis not present

## 2017-06-18 DIAGNOSIS — I129 Hypertensive chronic kidney disease with stage 1 through stage 4 chronic kidney disease, or unspecified chronic kidney disease: Secondary | ICD-10-CM | POA: Diagnosis not present

## 2017-06-19 DIAGNOSIS — G2581 Restless legs syndrome: Secondary | ICD-10-CM | POA: Diagnosis not present

## 2017-06-19 DIAGNOSIS — Z794 Long term (current) use of insulin: Secondary | ICD-10-CM | POA: Diagnosis not present

## 2017-06-19 DIAGNOSIS — I110 Hypertensive heart disease with heart failure: Secondary | ICD-10-CM | POA: Diagnosis not present

## 2017-06-19 DIAGNOSIS — I2510 Atherosclerotic heart disease of native coronary artery without angina pectoris: Secondary | ICD-10-CM | POA: Diagnosis not present

## 2017-06-19 DIAGNOSIS — N3281 Overactive bladder: Secondary | ICD-10-CM | POA: Diagnosis not present

## 2017-06-19 DIAGNOSIS — I4891 Unspecified atrial fibrillation: Secondary | ICD-10-CM | POA: Diagnosis not present

## 2017-06-19 DIAGNOSIS — E119 Type 2 diabetes mellitus without complications: Secondary | ICD-10-CM | POA: Diagnosis not present

## 2017-06-19 DIAGNOSIS — E785 Hyperlipidemia, unspecified: Secondary | ICD-10-CM | POA: Diagnosis not present

## 2017-06-19 DIAGNOSIS — E669 Obesity, unspecified: Secondary | ICD-10-CM | POA: Diagnosis not present

## 2017-06-19 DIAGNOSIS — I509 Heart failure, unspecified: Secondary | ICD-10-CM | POA: Diagnosis not present

## 2017-06-27 DIAGNOSIS — L401 Generalized pustular psoriasis: Secondary | ICD-10-CM | POA: Diagnosis not present

## 2017-07-01 DIAGNOSIS — I5032 Chronic diastolic (congestive) heart failure: Secondary | ICD-10-CM | POA: Diagnosis not present

## 2017-07-01 DIAGNOSIS — I509 Heart failure, unspecified: Secondary | ICD-10-CM | POA: Diagnosis not present

## 2017-07-01 DIAGNOSIS — G2581 Restless legs syndrome: Secondary | ICD-10-CM | POA: Diagnosis not present

## 2017-07-07 DIAGNOSIS — Z0189 Encounter for other specified special examinations: Secondary | ICD-10-CM | POA: Diagnosis not present

## 2017-07-07 DIAGNOSIS — I10 Essential (primary) hypertension: Secondary | ICD-10-CM | POA: Diagnosis not present

## 2017-07-07 DIAGNOSIS — I5032 Chronic diastolic (congestive) heart failure: Secondary | ICD-10-CM | POA: Diagnosis not present

## 2017-07-14 DIAGNOSIS — Z45018 Encounter for adjustment and management of other part of cardiac pacemaker: Secondary | ICD-10-CM | POA: Diagnosis not present

## 2017-07-14 DIAGNOSIS — Z950 Presence of cardiac pacemaker: Secondary | ICD-10-CM | POA: Diagnosis not present

## 2017-07-24 ENCOUNTER — Other Ambulatory Visit: Payer: Self-pay | Admitting: Adult Health

## 2017-07-24 MED ORDER — OXYBUTYNIN CHLORIDE ER 5 MG PO TB24: 5.0000 mg | ORAL_TABLET | Freq: Every day | ORAL | 6 refills | Status: DC

## 2017-07-24 NOTE — Progress Notes (Signed)
Refilled oxybutynin.

## 2017-07-29 ENCOUNTER — Ambulatory Visit: Payer: Medicare HMO | Admitting: Cardiovascular Disease

## 2017-07-29 ENCOUNTER — Encounter: Payer: Self-pay | Admitting: Cardiovascular Disease

## 2017-07-29 VITALS — BP 147/63 | HR 92 | Ht 62.0 in | Wt 194.4 lb

## 2017-07-29 DIAGNOSIS — I480 Paroxysmal atrial fibrillation: Secondary | ICD-10-CM

## 2017-07-29 DIAGNOSIS — I495 Sick sinus syndrome: Secondary | ICD-10-CM | POA: Diagnosis not present

## 2017-07-29 DIAGNOSIS — Z7901 Long term (current) use of anticoagulants: Secondary | ICD-10-CM

## 2017-07-29 DIAGNOSIS — E668 Other obesity: Secondary | ICD-10-CM

## 2017-07-29 DIAGNOSIS — I2510 Atherosclerotic heart disease of native coronary artery without angina pectoris: Secondary | ICD-10-CM

## 2017-07-29 DIAGNOSIS — I5032 Chronic diastolic (congestive) heart failure: Secondary | ICD-10-CM

## 2017-07-29 DIAGNOSIS — G4731 Primary central sleep apnea: Secondary | ICD-10-CM | POA: Diagnosis not present

## 2017-07-29 NOTE — Progress Notes (Signed)
Patient ID: [name], female   DOB: January 18, 1945, 72 y.o.   MRN: [id number]    Primary MD: Dr. Deland Pretty  Primary cardiologist: Dr. Einar Gip  HPI: [name], is a 72 y.o. female who is referred by Dr.Pharr for a sleep  evaluation.  [name] was diagnosed with sleep apnea in February 2013 when she was referred for sleep study for evaluation of nonrestorative sleep and loud snoring.  At that time she had witnessed apnea, was gasping for breath and her symptoms had persisted for over 2 years.  She was found to have moderate obstructive sleep apnea.  Overall, with an HIV 18.5 per hour.  However, with rim sleep.  Events were severe, with an HI 55.7 per hour.  She had significant oxygen desaturation to 70% with non-REM sleep and 64% with rim sleep.  She had moderate snoring.  She underwent a CPAP titration and with CPAP therapy due to development of central events, and the need for higher pressures.  She ultimately required BiPAP therapy.  Previously she had been followed by [facility] and had switched to [facility] in January 2015 for her [employer] company.  She has not been able to get any supplies.  She has noticed a significant "whistle "her mask  I saw her for sleep evaluation in April 2016 and at that time was able to obtain a download of her unit from 03/28/2014 through 04/26/2014.  She was meeting Medicare compliance with use of 100%.  She is averaging 7 hours and 22 minutes per day and is currently set at an IPAP pressure of 19 and an EPAP pressure of 15.  Her AHI is very good at 4.0.  She set at a ramp time of 30 minutes.  She has a nasal mask, F&P Zest nasal mask.  She typically goes to bed at a 11:30 p.m. and wakes up at 6 AM for a sleep duration of only 6-1/2 hours.  She continues to have daytime sleepiness and an Epworth Sleepiness Scale score was elevated and endorsed at 15.  Since I last saw her in April 2016, she states that her BiPAP machine quit and is no longer been functional for over 2 years.   As result, she has not been sleeping well.  She typically goes to bed at midnight and wakes up between 7 and 7:30 in the morning.  She usually has 2-3 episodes of nocturia.  She does snore.  She had recently seen Dr. Thayer Jew far who recommended reassessment of her sleep apnea she presents for evaluation.   Past Medical History:  Diagnosis Date  . Arthritis   . CHF (congestive heart failure) ([place])   . Diabetes mellitus without complication ([place])   . History of chickenpox   . Hx of psoriatic arthritis   . Hyperlipemia   . Hypertension   . Mitral valve disorders(424.0)   . Obstructive sleep apnea   . Pacemaker   . Peripheral neuropathy   . Renal disorder Kidney disease stage2  . Thyroid disease hypothyroid  . Urine incontinence   . Vitamin D deficiency     Past Surgical History:  Procedure Laterality Date  . CARDIAC CATHETERIZATION N/A 04/29/2015   Procedure: Temporary Pacemaker;  Surgeon: Adrian Prows, MD;  Location: [facility];  Service: Cardiovascular;  Laterality: N/A;  . EP IMPLANTABLE DEVICE N/A 04/30/2015   Procedure: Pacemaker Implant;  Surgeon: Evans Lance, MD;  Location: [facility];  Service: Cardiovascular;  Laterality: N/A;  . LEFT  AND RIGHT HEART CATHETERIZATION WITH CORONARY ANGIOGRAM N/A 09/30/2011   Procedure: LEFT AND RIGHT HEART CATHETERIZATION WITH CORONARY ANGIOGRAM;  Surgeon: Laverda Page, MD;  Location: [facility];  Service: Cardiovascular;  Laterality: N/A;  . YAG LASER APPLICATION Right 9/37/3428   Procedure: YAG LASER APPLICATION;  Surgeon: Elta Guadeloupe T. Gershon Crane, MD;  Location: [facility];  Service: Ophthalmology;  Laterality: Right;  pt knows to arrive at 11:15  . YAG LASER APPLICATION Left 07/18/8113   Procedure: YAG LASER APPLICATION;  Surgeon: Rutherford Guys, MD;  Location: [facility];  Service: Ophthalmology;  Laterality: Left;    Allergies  Allergen Reactions  . Hydrocodone Other (See Comments)    lethargic  . Invokana [Canagliflozin] Other (See  Comments)    Made heart race  . Oxycodone Other (See Comments)    lethargic    Current Outpatient Medications  Medication Sig Dispense Refill  . apixaban (ELIQUIS) 5 MG TABS tablet Take 1 tablet (5 mg total) by mouth 2 (two) times daily. 60 tablet 3  . cholecalciferol (VITAMIN D) 1000 UNITS tablet Take 2,000 Units by mouth daily.     . fenofibrate 160 MG tablet Take 160 mg by mouth daily with supper.     . furosemide (LASIX) 40 MG tablet Take 1 tablet (40 mg total) by mouth daily. (Patient taking differently: Take 40 mg by mouth daily. Half tablet 77m daily. If needed patient take whole tablet 434m 30 tablet 0  . insulin NPH-regular Human (NOVOLIN 70/30) (70-30) 100 UNIT/ML injection Inject 35-75 Units into the skin 2 (two) times daily with a meal. Inject 75 units subcutaneously with breakfast and 35 units with supper    . isosorbide mononitrate (IMDUR) 30 MG 24 hr tablet Take 1 tablet (30 mg total) by mouth at bedtime.    . metoprolol tartrate (LOPRESSOR) 50 MG tablet Take 1 tablet (50 mg total) by mouth 2 (two) times daily. 60 tablet 3  . nitroGLYCERIN (NITROSTAT) 0.4 MG SL tablet Place 1 tablet (0.4 mg total) under the tongue every 5 (five) minutes as needed for chest pain. 25 tablet 1  . nystatin cream (MYCOSTATIN) Apply 1 application topically 2 (two) times daily. 30 g 3  . olmesartan (BENICAR) 40 MG tablet Take 1 tablet (40 mg total) by mouth daily. 30 tablet 1  . omega-3 acid ethyl esters (LOVAZA) 1 g capsule Take 2 capsules 2 (two) times daily by mouth.    . oxybutynin (DITROPAN-XL) 5 MG 24 hr tablet Take 1 tablet (5 mg total) by mouth at bedtime. 30 tablet 6  . pravastatin (PRAVACHOL) 10 MG tablet Take 10 mg by mouth daily.    . Marland Kitchenpironolactone (ALDACTONE) 25 MG tablet Take 1 tablet every morning by mouth.    . triamcinolone ointment (KENALOG) 0.5 % Apply 1 application topically 2 (two) times daily. 30 g 1   No current facility-administered medications for this visit.     Social  History   Socioeconomic History  . Marital status: Married    Spouse name: Not on file  . Number of children: Not on file  . Years of education: Not on file  . Highest education level: Not on file  Occupational History  . Not on file  Social Needs  . Financial resource strain: Not on file  . Food insecurity:    Worry: Not on file    Inability: Not on file  . Transportation needs:    Medical: Not on file    Non-medical: Not on file  Tobacco Use  . Smoking status: Never Smoker  . Smokeless tobacco: Never Used  Substance and Sexual Activity  . Alcohol use: No    Alcohol/week: 0.0 oz  . Drug use: No  . Sexual activity: Not Currently  Lifestyle  . Physical activity:    Days per week: Not on file    Minutes per session: Not on file  . Stress: Not on file  Relationships  . Social connections:    Talks on phone: Not on file    Gets together: Not on file    Attends religious service: Not on file    Active member of club or organization: Not on file    Attends meetings of clubs or organizations: Not on file    Relationship status: Not on file  . Intimate partner violence:    Fear of current or ex partner: Not on file    Emotionally abused: Not on file    Physically abused: Not on file    Forced sexual activity: Not on file  Other Topics Concern  . Not on file  Social History Narrative  . Not on file   Social history is notable: She is married, has 3 children 5 grandchildren.  Family History  Problem Relation Age of Onset  . Arthritis Mother   . Diabetes Mother   . Heart disease Mother   . Hyperlipidemia Mother   . Hypertension Mother   . Arthritis Father   . Asthma Father   . Heart attack Father   . Hyperlipidemia Father   . Hypertension Father   . Stroke Father   . Arthritis Sister   . Diabetes Sister   . Hypertension Sister   . Arthritis Brother   . Heart attack Brother   . Heart disease Brother   . Hyperlipidemia Brother   . Hypertension Brother   .  Alcohol abuse Brother   . Arthritis Brother   . Diabetes Brother   . Early death Brother   . Heart disease Brother   . Hyperlipidemia Brother   . Hypertension Brother   . Arthritis Sister   . Cancer Sister   . Diabetes Sister   . Hyperlipidemia Sister   . Hypertension Sister   . Stroke Sister   . Cancer Sister   . Hyperlipidemia Sister   . Hypertension Sister   . Hypertension Daughter    Family history is notable that her mother died at age 52 and had diabetes and heart disease.  Father died of stroke at age 66.  There are total of 6 siblings, 3 brothers and 3 sisters of which 2 brothers and 2 sisters are deceased.  One sister who is deceased, had sleep apnea but was untreated.  One brother at heart disease is deceased and had sleep apnea and was noncompliant with his CPAP use.  ROS General: Negative; No fevers, chills, or night sweats.  Positive for moderate obesity HEENT: Negative; No changes in vision or hearing, sinus congestion, difficulty swallowing Pulmonary: Negative; No cough, wheezing, shortness of breath, hemoptysis Cardiovascular: Positive for diastolic heart failure GI: Negative; No nausea, vomiting, diarrhea, or abdominal pain GU: Negative; No dysuria, hematuria, or difficulty voiding Musculoskeletal: Negative; no myalgias, joint pain, or weakness Hematologic: Negative; no easy bruising, bleeding Endocrine: Positive for diabetes mellitus Neuro: Negative; no changes in balance, headaches Skin: Negative; No rashes or skin lesions Psychiatric: Negative; No behavioral problems, depression Sleep: Positive for OSA previously treated with BiPAP and previously had increased central events with CPAP therapy  Physical Exam BP (!) 147/63   Pulse 92   Ht 5' 2"  (1.575 m)   Wt 194 lb 6.4 oz (88.2 kg)   BMI 35.56 kg/m    Repeat blood pressure was 140/72  Wt Readings from Last 3 Encounters:  07/29/17 194 lb 6.4 oz (88.2 kg)  06/08/17 194 lb 0.1 oz (88 kg)  05/12/17 195  lb (88.5 kg)   General: Alert, oriented, no distress.  Skin: normal turgor, no rashes, warm and dry HEENT: Normocephalic, atraumatic. Pupils equal round and reactive to light; sclera anicteric; extraocular muscles intact; Fundi hemorrhages or exudates Nose without nasal septal hypertrophy Mouth/Parynx benign; Mallinpatti scale 3 with elongated uvula Neck: No JVD, no carotid bruits; normal carotid upstroke Lungs: clear to ausculatation and percussion; no wheezing or rales Chest wall: without tenderness to palpitation Heart: PMI not displaced, RRR, s1 s2 normal, 1/6 systolic murmur, no diastolic murmur, no rubs, gallops, thrills, or heaves Abdomen: Central adiposity soft, nontender; no hepatosplenomehaly, BS+; abdominal aorta nontender and not dilated by palpation. Back: no CVA tenderness Pulses 2+ Musculoskeletal: full range of motion, normal strength, no joint deformities Extremities: no clubbing cyanosis or edema, Homan's sign negative  Neurologic: grossly nonfocal; Cranial nerves grossly wnl Psychologic: Normal mood and affect   Recent ECG from Jun 07, 2017 was independently reviewed by me which reveals atrially paced rhythm.  Poor anterior R wave progression.  LABS:  BMP Latest Ref Rng & Units 06/08/2017 06/07/2017 11/25/2016  Glucose 65 - 99 mg/dL 84 136(H) 133(H)  BUN 6 - 20 mg/dL 19 16 22   Creatinine 0.44 - 1.00 mg/dL 1.04(H) 1.01(H) 1.10  Sodium 135 - 145 mmol/L 139 139 136  Potassium 3.5 - 5.1 mmol/L 3.7 4.4 4.4  Chloride 101 - 111 mmol/L 98(L) 105 100  CO2 22 - 32 mmol/L 32 26 27  Calcium 8.9 - 10.3 mg/dL 10.1 9.4 10.3     Hepatic Function Latest Ref Rng & Units 06/08/2017 10/08/2016 06/01/2012  Total Protein 6.5 - 8.1 g/dL 7.0 7.2 7.0  Albumin 3.5 - 5.0 g/dL 3.4(L) 4.6 3.0(L)  AST 15 - 41 U/L 21 33 36  ALT 14 - 54 U/L 30 34 56(H)  Alk Phosphatase 38 - 126 U/L 24(L) 29(L) 27(L)  Total Bilirubin 0.3 - 1.2 mg/dL 0.7 0.4 0.4     CBC Latest Ref Rng & Units 06/08/2017  06/07/2017 10/18/2015  WBC 4.0 - 10.5 K/uL 6.0 8.1 8.4  Hemoglobin 12.0 - 15.0 g/dL 13.2 13.3 13.7  Hematocrit 36.0 - 46.0 % 42.0 41.7 42.2  Platelets 150 - 400 K/uL 171 172 186     Lipid Panel     Component Value Date/Time   CHOL 171 10/01/2015 0021   TRIG 516 (H) 10/01/2015 0021   HDL 21 (L) 10/01/2015 0021   CHOLHDL 8.1 10/01/2015 0021   VLDL UNABLE TO CALCULATE IF TRIGLYCERIDE OVER 400 mg/dL 10/01/2015 0021   LDLCALC UNABLE TO CALCULATE IF TRIGLYCERIDE OVER 400 mg/dL 10/01/2015 0021     RADIOLOGY: No results found.  IMPRESSION:  1. Complex sleep apnea syndrome   2. Coronary artery disease involving native coronary artery of native heart without angina pectoris   3. Chronic diastolic heart failure ([place])   4. Tachycardia-bradycardia syndrome ([place])   5. Paroxysmal atrial fibrillation ([place])   6. Anticoagulated   7. Moderate obesity      ASSESSMENT AND PLAN: Ms. Ronit Cranfield is a 72 year old female who has documented to have complex sleep apnea with both obstructive and central events  and has been on BiPAP therapy since 2013.  She has significant cardiovascular comorbidities and has been followed by Dr. Einar Gip for her cardiac issues.  I had not seen her since April 2016.  Since her last evaluation, approximately 2 years ago her BiPAP machine became nonfunctional and as result she never pursued this with her DME and has not been on treatment for over 2 years.  Had a long discussion with her.  Again discussed the importance of treating her sleep apnea with her cardiac issues which include hypertension, as well as shortness of breath and chronic diastolic heart failure.  After much discussion, she agrees to reinitiate the process.  As result I will schedule her for a new sleep study and I will schedule this as a split night study with plans for BiPAP titration since in the past she had was previously noted to have ventral events with CPAP therapy.  She is obese with a body mass index of  35.56.  Weight loss was recommended.  Will need to be referred to a new DME company following reinstitution of therapy.  She will see Dr. Einar Gip for follow-up of her cardiac issues.  Blood pressure today was mildly increased on her current regimen of olmesartan 40 mg, spironolactone 25 mg, metoprolol 50 mg twice a day, furosemide 40 mg daily and she also is on isosorbide 30 mg.  She is diabetic on insulin.  She is on pravastatin and fenofibrate for mixed hyperlipidemia.  She has been on Eliquis for anticoagulation and denies bleeding.  I will see her in several months in follow-up of her sleep study and reinitiation of BiPAP therapy.   Time Spent: 35 minutes Troy Sine, MD, [facility]  07/31/2017 3:20 PM

## 2017-07-29 NOTE — Patient Instructions (Signed)
Medication Instructions:  Your physician recommends that you continue on your current medications as directed. Please refer to the Current Medication list given to you today.  Testing/Procedures: Your physician has recommended that you have a sleep study. This test records several body functions during sleep, including: brain activity, eye movement, oxygen and carbon dioxide blood levels, heart rate and rhythm, breathing rate and rhythm, the flow of air through your mouth and nose, snoring, body muscle movements, and chest and belly movement.  Follow-Up: 3-4 months with Dr. Kelly (sleep clinic)  Any Other Special Instructions Will Be Listed Below (If Applicable).     If you need a refill on your cardiac medications before your next appointment, please call your pharmacy.   

## 2017-07-31 ENCOUNTER — Encounter: Payer: Self-pay | Admitting: Cardiovascular Disease

## 2017-08-03 ENCOUNTER — Telehealth: Payer: Self-pay | Admitting: *Deleted

## 2017-08-03 NOTE — Telephone Encounter (Signed)
-----   Message from Roland Earl sent at 07/29/2017 10:06 AM EDT ----- Regarding: Sleep Study Ordered by Dr. Claiborne Billings

## 2017-08-03 NOTE — Telephone Encounter (Signed)
PA submitted to [employer] via web portal for Sleep study.

## 2017-08-04 ENCOUNTER — Telehealth: Payer: Self-pay | Admitting: *Deleted

## 2017-08-04 NOTE — Telephone Encounter (Signed)
Patient notified of sleep study appointment. 

## 2017-08-04 NOTE — Telephone Encounter (Signed)
Called patient to give sleep study appointment. No answer. Unable to leave message due to no voicemail being set up. Will try to call patient at a later time.

## 2017-08-04 NOTE — Telephone Encounter (Signed)
-----   Message from Roland Earl sent at 07/29/2017 10:06 AM EDT ----- Regarding: Sleep Study Ordered by Dr. Claiborne Billings

## 2017-08-05 DIAGNOSIS — E119 Type 2 diabetes mellitus without complications: Secondary | ICD-10-CM | POA: Diagnosis not present

## 2017-08-05 DIAGNOSIS — Z794 Long term (current) use of insulin: Secondary | ICD-10-CM | POA: Diagnosis not present

## 2017-08-11 ENCOUNTER — Ambulatory Visit: Payer: Medicare HMO | Attending: Cardiovascular Disease | Admitting: Cardiovascular Disease

## 2017-08-11 DIAGNOSIS — I4891 Unspecified atrial fibrillation: Secondary | ICD-10-CM | POA: Insufficient documentation

## 2017-08-11 DIAGNOSIS — Z794 Long term (current) use of insulin: Secondary | ICD-10-CM | POA: Diagnosis not present

## 2017-08-11 DIAGNOSIS — I5032 Chronic diastolic (congestive) heart failure: Secondary | ICD-10-CM

## 2017-08-11 DIAGNOSIS — G4733 Obstructive sleep apnea (adult) (pediatric): Secondary | ICD-10-CM

## 2017-08-11 DIAGNOSIS — Z79899 Other long term (current) drug therapy: Secondary | ICD-10-CM | POA: Diagnosis not present

## 2017-08-11 DIAGNOSIS — G4731 Primary central sleep apnea: Secondary | ICD-10-CM

## 2017-08-11 DIAGNOSIS — I480 Paroxysmal atrial fibrillation: Secondary | ICD-10-CM

## 2017-08-11 DIAGNOSIS — I2510 Atherosclerotic heart disease of native coronary artery without angina pectoris: Secondary | ICD-10-CM

## 2017-08-11 DIAGNOSIS — I495 Sick sinus syndrome: Secondary | ICD-10-CM

## 2017-08-11 DIAGNOSIS — Z7901 Long term (current) use of anticoagulants: Secondary | ICD-10-CM | POA: Insufficient documentation

## 2017-08-18 DIAGNOSIS — I2510 Atherosclerotic heart disease of native coronary artery without angina pectoris: Secondary | ICD-10-CM | POA: Diagnosis not present

## 2017-08-18 DIAGNOSIS — I5032 Chronic diastolic (congestive) heart failure: Secondary | ICD-10-CM | POA: Diagnosis not present

## 2017-08-18 DIAGNOSIS — Z950 Presence of cardiac pacemaker: Secondary | ICD-10-CM | POA: Diagnosis not present

## 2017-08-18 DIAGNOSIS — Z45018 Encounter for adjustment and management of other part of cardiac pacemaker: Secondary | ICD-10-CM | POA: Diagnosis not present

## 2017-08-18 DIAGNOSIS — I739 Peripheral vascular disease, unspecified: Secondary | ICD-10-CM | POA: Diagnosis not present

## 2017-08-18 DIAGNOSIS — I10 Essential (primary) hypertension: Secondary | ICD-10-CM | POA: Diagnosis not present

## 2017-08-18 DIAGNOSIS — E039 Hypothyroidism, unspecified: Secondary | ICD-10-CM | POA: Diagnosis not present

## 2017-08-22 ENCOUNTER — Encounter: Payer: Self-pay | Admitting: Cardiovascular Disease

## 2017-08-22 NOTE — Procedures (Signed)
[facility]       Patient Name: [name], [name] Date: 08/11/2017 Gender: Female D.O.B: Feb 08, 1945 Age (years): 67 Referring Provider: Shelva Majestic MD, ABSM Height (inches): 62 Interpreting Physician: Shelva Majestic MD, ABSM Weight (lbs): 194 RPSGT: Rosebud Poles BMI: 35 MRN: [id number] Neck Size: 17.00  CLINICAL INFORMATION Sleep Study Type: Split Night CPAP  Indication for sleep study: H/0 complex sleep apnea; remotely on BiPAP and had not used due to machine malfunction for > 2 years.   Epworth Sleepiness Score: 12  SLEEP STUDY TECHNIQUE As per the AASM Manual for the Scoring of Sleep and Associated Events v2.3 (April 2016) with a hypopnea requiring 4% desaturations.  The channels recorded and monitored were frontal, central and occipital EEG, electrooculogram (EOG), submentalis EMG (chin), nasal and oral airflow, thoracic and abdominal wall motion, anterior tibialis EMG, snore microphone, electrocardiogram, and pulse oximetry. Continuous positive airway pressure (CPAP) was initiated when the patient met split night criteria and was titrated according to treat sleep-disordered breathing.  MEDICATIONS     apixaban (ELIQUIS) 5 MG TABS tablet         cholecalciferol (VITAMIN D) 1000 UNITS tablet         fenofibrate 160 MG tablet         furosemide (LASIX) 40 MG tablet         insulin NPH-regular Human (NOVOLIN 70/30) (70-30) 100 UNIT/ML injection         isosorbide mononitrate (IMDUR) 30 MG 24 hr tablet         metoprolol tartrate (LOPRESSOR) 50 MG tablet         nitroGLYCERIN (NITROSTAT) 0.4 MG SL tablet         nystatin cream (MYCOSTATIN)         olmesartan (BENICAR) 40 MG tablet         omega-3 acid ethyl esters (LOVAZA) 1 g capsule         oxybutynin (DITROPAN-XL) 5 MG 24 hr tablet         pravastatin (PRAVACHOL) 10 MG tablet         spironolactone (ALDACTONE) 25 MG tablet         triamcinolone ointment (KENALOG) 0.5 %      Medications self-administered by  patient taken the night of the study : N/A  RESPIRATORY PARAMETERS Diagnostic Total AHI (/hr): 26.6 RDI (/hr): 26.6 OA Index (/hr): 0.6 CA Index (/hr): 0.0 REM AHI (/hr): 62.4 NREM AHI (/hr): 18.0 Supine AHI (/hr): 47.3 Non-supine AHI (/hr): 26.3 Min O2 Sat (%): 73.0 Mean O2 (%): 87.5 Time below 88% (min): 103.4   Titration Optimal Pressure (cm):  AHI at Optimal Pressure (/hr): N/A Min O2 at Optimal Pressure (%): 80.0 Supine % at Optimal (%): N/A Sleep % at Optimal (%): N/A   SLEEP ARCHITECTURE The recording time for the entire night was 382 minutes.  During a baseline period of 209.4 minutes, the patient slept for 194.0 minutes in REM and nonREM, yielding a sleep efficiency of 92.7%%. Sleep onset after lights out was 4.7 minutes with a REM latency of 68.0 minutes. The patient spent 6.2%% of the night in stage N1 sleep, 34.8%% in stage N2 sleep, 39.7%% in stage N3 and 19.3% in REM.  During the titration period of 165.1 minutes, the patient slept for 114.5 minutes in REM and nonREM, yielding a sleep efficiency of 69.4%%. Sleep onset after CPAP initiation was 18.8 minutes with a REM latency of 16.0 minutes. The patient spent 9.6%% of the  night in stage N1 sleep, 48.0%% in stage N2 sleep, 13.5%% in stage N3 and 28.8% in REM.  CARDIAC DATA The 2 lead EKG demonstrated sinus rhythm. The mean heart rate was 100.0 beats per minute. Other EKG findings include: Atrial Fibrillation.  LEG MOVEMENT DATA The total Periodic Limb Movements of Sleep (PLMS) were 0. The PLMS index was 0.0 .  IMPRESSIONS - Moderate obstructive sleep apnea occurred during the diagnostic portion of the study (AHI 26.6/hour). The patient was titrated only up to 11 cm water. An optimal PAP pressure could not be selected for this patient based on the available study data. - No significant central sleep apnea occurred during the diagnostic portion of the study (CAI = 0.0/hour). - Severe oxygen desaturation to a nadir of 73%. -  The patient snored with moderate snoring volume during the diagnostic portion of the study. - EKG findings include Atrial Fibrillation. - Clinically significant periodic limb movements did not occur during sleep.  DIAGNOSIS - Obstructive Sleep Apnea (327.23 [G47.33 ICD-10])  RECOMMENDATIONS - Recommend initial trial of CPAP Auto with EPR at 10 - 20 cm water and heated humidification. An AirFit N 10 mask was used for the titration. On note, in the past the patient had been on BiPAP therapy, and BiPAP titration may be necessary if she cannot tolerate CPAP at higher pressures. - Effort should be made to optimize nasal and oropharyngeal patency. - Avoid alcohol, sedatives and other CNS depressants that may worsen sleep apnea and disrupt normal sleep architecture. - Sleep hygiene should be reviewed to assess factors that may improve sleep quality. - Weight management (BMI 35) and regular exercise should be initiated or continued. - Recommend a download be obtained in 30 days and sleep clinic evaluation after 4 weeks of therapy  [Electronically signed] 08/22/2017 02:59 PM  Shelva Majestic MD, [facility], ABSM Diplomate, American Board of Sleep Medicine   NPI: [id number] [place] PH: [phone]   FX: [phone] [place]

## 2017-08-24 ENCOUNTER — Telehealth: Payer: Self-pay | Admitting: *Deleted

## 2017-08-24 NOTE — Telephone Encounter (Signed)
-----   Message from Troy Sine, MD sent at 08/22/2017  3:10 PM EDT ----- Mariann Laster, please initiate with DME a trial of CPAP Auto, but if cannot tolerate may need BiPAP titration.

## 2017-08-24 NOTE — Telephone Encounter (Signed)
Patient notified of sleep study results and recommendations. CPAP referral sent to [facility] in [place] per patient's request.

## 2017-08-25 ENCOUNTER — Telehealth: Payer: Self-pay | Admitting: Cardiovascular Disease

## 2017-08-25 NOTE — Telephone Encounter (Signed)
New problem    Pt is calling because her prescription for her C-Pap was sent to [place] and she said they are not in network with her [employer]. Please call pt to advise.

## 2017-08-26 NOTE — Telephone Encounter (Signed)
Returned a call to patient and informed her that I will resend her referral to a provider that is in-network with her insurance.

## 2017-08-27 ENCOUNTER — Telehealth: Payer: Self-pay | Admitting: *Deleted

## 2017-08-27 NOTE — Telephone Encounter (Signed)
Faxed CPAP order to [place] @ [phone].

## 2017-08-28 ENCOUNTER — Other Ambulatory Visit: Payer: Self-pay

## 2017-08-28 ENCOUNTER — Encounter (HOSPITAL_COMMUNITY): Payer: Self-pay | Admitting: General Practice

## 2017-08-28 ENCOUNTER — Inpatient Hospital Stay (HOSPITAL_COMMUNITY)
Admission: AD | Admit: 2017-08-28 | Discharge: 2017-09-01 | DRG: 286 | Disposition: A | Discharge status: Home / Self Care | Payer: Medicare HMO | Source: Ambulatory Visit | Attending: Cardiology | Admitting: Cardiology

## 2017-08-28 DIAGNOSIS — E782 Mixed hyperlipidemia: Secondary | ICD-10-CM | POA: Diagnosis present

## 2017-08-28 DIAGNOSIS — I130 Hypertensive heart and chronic kidney disease with heart failure and stage 1 through stage 4 chronic kidney disease, or unspecified chronic kidney disease: Principal | ICD-10-CM | POA: Diagnosis present

## 2017-08-28 DIAGNOSIS — Z6834 Body mass index (BMI) 34.0-34.9, adult: Secondary | ICD-10-CM

## 2017-08-28 DIAGNOSIS — E669 Obesity, unspecified: Secondary | ICD-10-CM | POA: Diagnosis present

## 2017-08-28 DIAGNOSIS — E1142 Type 2 diabetes mellitus with diabetic polyneuropathy: Secondary | ICD-10-CM | POA: Diagnosis present

## 2017-08-28 DIAGNOSIS — N183 Chronic kidney disease, stage 3 (moderate): Secondary | ICD-10-CM | POA: Diagnosis not present

## 2017-08-28 DIAGNOSIS — G4733 Obstructive sleep apnea (adult) (pediatric): Secondary | ICD-10-CM | POA: Diagnosis present

## 2017-08-28 DIAGNOSIS — Z885 Allergy status to narcotic agent status: Secondary | ICD-10-CM

## 2017-08-28 DIAGNOSIS — Z888 Allergy status to other drugs, medicaments and biological substances status: Secondary | ICD-10-CM | POA: Diagnosis not present

## 2017-08-28 DIAGNOSIS — Z7901 Long term (current) use of anticoagulants: Secondary | ICD-10-CM | POA: Diagnosis not present

## 2017-08-28 DIAGNOSIS — Z79899 Other long term (current) drug therapy: Secondary | ICD-10-CM | POA: Diagnosis not present

## 2017-08-28 DIAGNOSIS — E1122 Type 2 diabetes mellitus with diabetic chronic kidney disease: Secondary | ICD-10-CM | POA: Diagnosis present

## 2017-08-28 DIAGNOSIS — Z794 Long term (current) use of insulin: Secondary | ICD-10-CM | POA: Diagnosis not present

## 2017-08-28 DIAGNOSIS — I480 Paroxysmal atrial fibrillation: Secondary | ICD-10-CM | POA: Diagnosis present

## 2017-08-28 DIAGNOSIS — Z950 Presence of cardiac pacemaker: Secondary | ICD-10-CM

## 2017-08-28 DIAGNOSIS — I5033 Acute on chronic diastolic (congestive) heart failure: Secondary | ICD-10-CM | POA: Diagnosis present

## 2017-08-28 DIAGNOSIS — I4891 Unspecified atrial fibrillation: Secondary | ICD-10-CM

## 2017-08-28 DIAGNOSIS — I129 Hypertensive chronic kidney disease with stage 1 through stage 4 chronic kidney disease, or unspecified chronic kidney disease: Secondary | ICD-10-CM | POA: Diagnosis not present

## 2017-08-28 DIAGNOSIS — I495 Sick sinus syndrome: Secondary | ICD-10-CM | POA: Diagnosis not present

## 2017-08-28 DIAGNOSIS — R0609 Other forms of dyspnea: Secondary | ICD-10-CM | POA: Diagnosis not present

## 2017-08-28 DIAGNOSIS — I5032 Chronic diastolic (congestive) heart failure: Secondary | ICD-10-CM | POA: Diagnosis present

## 2017-08-28 DIAGNOSIS — I2510 Atherosclerotic heart disease of native coronary artery without angina pectoris: Secondary | ICD-10-CM | POA: Diagnosis not present

## 2017-08-28 DIAGNOSIS — I272 Pulmonary hypertension, unspecified: Secondary | ICD-10-CM | POA: Diagnosis present

## 2017-08-28 DIAGNOSIS — R0600 Dyspnea, unspecified: Secondary | ICD-10-CM | POA: Diagnosis present

## 2017-08-28 DIAGNOSIS — R0602 Shortness of breath: Secondary | ICD-10-CM | POA: Diagnosis not present

## 2017-08-28 HISTORY — DX: Dyspnea, unspecified: R06.00

## 2017-08-28 LAB — COMPREHENSIVE METABOLIC PANEL
ALBUMIN: 3.3 g/dL — AB (ref 3.5–5.0)
ALK PHOS: 31 U/L — AB (ref 38–126)
ALT: 23 U/L (ref 0–44)
ANION GAP: 10 (ref 5–15)
AST: 21 U/L (ref 15–41)
BILIRUBIN TOTAL: 0.9 mg/dL (ref 0.3–1.2)
BUN: 24 mg/dL — ABNORMAL HIGH (ref 8–23)
CALCIUM: 10.5 mg/dL — AB (ref 8.9–10.3)
CO2: 29 mmol/L (ref 22–32)
Chloride: 96 mmol/L — ABNORMAL LOW (ref 98–111)
Creatinine, Ser: 1.11 mg/dL — ABNORMAL HIGH (ref 0.44–1.00)
GFR calc Af Amer: 55 mL/min — ABNORMAL LOW (ref >60)
GFR calc non Af Amer: 48 mL/min — ABNORMAL LOW (ref >60)
GLUCOSE: 260 mg/dL — AB (ref 70–99)
POTASSIUM: 4.1 mmol/L (ref 3.5–5.1)
Sodium: 135 mmol/L (ref 135–145)
TOTAL PROTEIN: 6.8 g/dL (ref 6.5–8.1)

## 2017-08-28 LAB — TSH: TSH: 2.596 u[IU]/mL (ref 0.350–4.500)

## 2017-08-28 LAB — BRAIN NATRIURETIC PEPTIDE: B Natriuretic Peptide: 353.1 pg/mL — ABNORMAL HIGH (ref 0.0–100.0)

## 2017-08-28 LAB — GLUCOSE, CAPILLARY: GLUCOSE-CAPILLARY: 223 mg/dL — AB (ref 70–99)

## 2017-08-28 LAB — TROPONIN I: Troponin I: 0.13 ng/mL (ref <0.03)

## 2017-08-28 MED ORDER — ISOSORBIDE MONONITRATE ER 30 MG PO TB24
30.0000 mg | ORAL_TABLET | Freq: Every day | ORAL | Status: DC
  Administered 2017-08-28: 30 mg via ORAL
  Filled 2017-08-28: qty 1

## 2017-08-28 MED ORDER — FENOFIBRATE 160 MG PO TABS
160.0000 mg | ORAL_TABLET | Freq: Every day | ORAL | Status: DC
  Administered 2017-08-28 – 2017-08-31 (×3): 160 mg via ORAL
  Filled 2017-08-28 (×4): qty 1

## 2017-08-28 MED ORDER — SODIUM CHLORIDE 0.9 % IV SOLN: 250.0000 mL | INTRAVENOUS | Status: DC | PRN

## 2017-08-28 MED ORDER — SPIRONOLACTONE 25 MG PO TABS
25.0000 mg | ORAL_TABLET | Freq: Every day | ORAL | Status: DC
  Administered 2017-08-29 – 2017-08-30 (×2): 25 mg via ORAL
  Filled 2017-08-28 (×2): qty 1

## 2017-08-28 MED ORDER — PRAVASTATIN SODIUM 20 MG PO TABS
10.0000 mg | ORAL_TABLET | Freq: Every day | ORAL | Status: DC
  Administered 2017-08-29 – 2017-09-01 (×4): 10 mg via ORAL
  Filled 2017-08-28 (×4): qty 1

## 2017-08-28 MED ORDER — VITAMIN D 1000 UNITS PO TABS
2000.0000 [IU] | ORAL_TABLET | Freq: Every day | ORAL | Status: DC
  Administered 2017-08-29 – 2017-09-01 (×4): 2000 [IU] via ORAL
  Filled 2017-08-28 (×4): qty 2

## 2017-08-28 MED ORDER — OXYBUTYNIN CHLORIDE ER 5 MG PO TB24
5.0000 mg | ORAL_TABLET | Freq: Every day | ORAL | Status: DC
  Administered 2017-08-28 – 2017-08-31 (×4): 5 mg via ORAL
  Filled 2017-08-28 (×4): qty 1

## 2017-08-28 MED ORDER — NITROGLYCERIN 0.4 MG SL SUBL: 0.4000 mg | SUBLINGUAL_TABLET | SUBLINGUAL | Status: DC | PRN

## 2017-08-28 MED ORDER — FUROSEMIDE 10 MG/ML IJ SOLN
40.0000 mg | Freq: Three times a day (TID) | INTRAMUSCULAR | Status: DC
  Administered 2017-08-28 – 2017-08-30 (×6): 40 mg via INTRAVENOUS
  Filled 2017-08-28 (×6): qty 4

## 2017-08-28 MED ORDER — INSULIN ASPART PROT & ASPART (70-30 MIX) 100 UNIT/ML ~~LOC~~ SUSP
50.0000 [IU] | Freq: Two times a day (BID) | SUBCUTANEOUS | Status: DC
  Administered 2017-08-28 – 2017-08-29 (×2): 50 [IU] via SUBCUTANEOUS
  Filled 2017-08-28: qty 10

## 2017-08-28 MED ORDER — ONDANSETRON HCL 4 MG/2ML IJ SOLN: 4.0000 mg | Freq: Four times a day (QID) | INTRAMUSCULAR | Status: DC | PRN

## 2017-08-28 MED ORDER — AMIODARONE HCL 200 MG PO TABS
200.0000 mg | ORAL_TABLET | Freq: Two times a day (BID) | ORAL | Status: DC
  Administered 2017-08-28 – 2017-08-29 (×2): 200 mg via ORAL
  Filled 2017-08-28 (×2): qty 1

## 2017-08-28 MED ORDER — SODIUM CHLORIDE 0.9% FLUSH
3.0000 mL | Freq: Two times a day (BID) | INTRAVENOUS | Status: DC
Start: 2017-08-28 — End: 2017-09-01
  Administered 2017-08-28 – 2017-08-30 (×5): 3 mL via INTRAVENOUS

## 2017-08-28 MED ORDER — OMEGA-3-ACID ETHYL ESTERS 1 G PO CAPS
2.0000 | ORAL_CAPSULE | Freq: Two times a day (BID) | ORAL | Status: DC
  Administered 2017-08-28 – 2017-09-01 (×8): 2 g via ORAL
  Filled 2017-08-28 (×8): qty 2

## 2017-08-28 MED ORDER — SODIUM CHLORIDE 0.9% FLUSH: 3.0000 mL | INTRAVENOUS | Status: DC | PRN

## 2017-08-28 MED ORDER — APIXABAN 5 MG PO TABS
5.0000 mg | ORAL_TABLET | Freq: Two times a day (BID) | ORAL | Status: DC
  Administered 2017-08-28 – 2017-08-29 (×3): 5 mg via ORAL
  Filled 2017-08-28 (×3): qty 1

## 2017-08-28 MED ORDER — ACETAMINOPHEN 325 MG PO TABS: 650.0000 mg | ORAL_TABLET | ORAL | Status: DC | PRN

## 2017-08-28 MED ORDER — METOPROLOL TARTRATE 50 MG PO TABS
50.0000 mg | ORAL_TABLET | Freq: Two times a day (BID) | ORAL | Status: DC
  Administered 2017-08-28 – 2017-08-29 (×2): 50 mg via ORAL
  Filled 2017-08-28 (×2): qty 1

## 2017-08-28 NOTE — Progress Notes (Signed)
Dr. Nadyne Coombes called about critical troponin of 0.13 and elevated HR. Pt in and out of Afib 90s-120s and NSR 80s. Pt asymptomatic. Alert and oriented resting in bed. Verbal order given to place order for amio PO 200mg  BID. Will continue to monitor. Order in, awaiting pharmacy verification.

## 2017-08-28 NOTE — H&P (Addendum)
[name] is an 72 y.o. female.   Chief Complaint: Palpitations, dyspnea and chest pain HPI: [name]  is a 72 y.o. female  With hypertension, uncontrolled type II diabetes mellitus, nonobstructive to moderate CAD by catheterization in 2013, hyperlipidemia, stage III chronic kidney disease, moderate obesity with obstructive sleep apnea, has recently been prescribed CPAP which she still hasn't obtained yet, and HFpEF, sick sinus syndrome and paroxysmal atrial fibrillation S/P dual chamber pacemaker implantation in 2017 is admitted to the hospital with about complaints.  Patient states that she woke up this morning not feeling well, with rapid palpitations and associated with marked worsening dyspnea and chest tightness.  She thought she is going to have a heart attack.  She called our office stating that she does not feel well and even with minimal activity she gets short of breath.  Pacemaker transmission at revealed sustained atrial fibrillation with controlled RVR lasting for about 2 hours.  She is accompanied by her daughter at the bedside.  States that she is very short of breath, fatigue and weak.  She has not had any recurrence of chest pain, states that the chest pain lasted for about 5-10 minutes without any radiation.  No associated nausea vomiting.  Past Medical History:  Diagnosis Date  . Arthritis   . CHF (congestive heart failure) ([place])   . Diabetes mellitus without complication ([place])   . Dyspnea   . History of chickenpox   . Hx of psoriatic arthritis   . Hyperlipemia   . Hypertension   . Mitral valve disorders(424.0)   . Obstructive sleep apnea   . Pacemaker   . Peripheral neuropathy   . Renal disorder Kidney disease stage2  . Thyroid disease hypothyroid  . Urine incontinence   . Vitamin D deficiency     Past Surgical History:  Procedure Laterality Date  . CARDIAC CATHETERIZATION N/A 04/29/2015   Procedure: Temporary Pacemaker;  Surgeon: Adrian Prows, MD;   Location: [facility];  Service: Cardiovascular;  Laterality: N/A;  . EP IMPLANTABLE DEVICE N/A 04/30/2015   Procedure: Pacemaker Implant;  Surgeon: Evans Lance, MD;  Location: [facility];  Service: Cardiovascular;  Laterality: N/A;  . LEFT AND RIGHT HEART CATHETERIZATION WITH CORONARY ANGIOGRAM N/A 09/30/2011   Procedure: LEFT AND RIGHT HEART CATHETERIZATION WITH CORONARY ANGIOGRAM;  Surgeon: Laverda Page, MD;  Location: [facility] CATH LAB;  Service: Cardiovascular;  Laterality: N/A;  . YAG LASER APPLICATION Right 1/82/9937   Procedure: YAG LASER APPLICATION;  Surgeon: Elta Guadeloupe T. Gershon Crane, MD;  Location: [facility];  Service: Ophthalmology;  Laterality: Right;  pt knows to arrive at 11:15  . YAG LASER APPLICATION Left 01/19/9676   Procedure: YAG LASER APPLICATION;  Surgeon: Rutherford Guys, MD;  Location: [facility];  Service: Ophthalmology;  Laterality: Left;    Family History  Problem Relation Age of Onset  . Arthritis Mother   . Diabetes Mother   . Heart disease Mother   . Hyperlipidemia Mother   . Hypertension Mother   . Arthritis Father   . Asthma Father   . Heart attack Father   . Hyperlipidemia Father   . Hypertension Father   . Stroke Father   . Arthritis Sister   . Diabetes Sister   . Hypertension Sister   . Arthritis Brother   . Heart attack Brother   . Heart disease Brother   . Hyperlipidemia Brother   . Hypertension Brother   . Alcohol abuse Brother   .  Arthritis Brother   . Diabetes Brother   . Early death Brother   . Heart disease Brother   . Hyperlipidemia Brother   . Hypertension Brother   . Arthritis Sister   . Cancer Sister   . Diabetes Sister   . Hyperlipidemia Sister   . Hypertension Sister   . Stroke Sister   . Cancer Sister   . Hyperlipidemia Sister   . Hypertension Sister   . Hypertension Daughter    Social History:  reports that she has never smoked. She has never used smokeless tobacco. She reports that she does not drink alcohol or use  drugs.  Allergies:  Allergies  Allergen Reactions  . Hydrocodone Other (See Comments)    lethargic  . Invokana [Canagliflozin] Other (See Comments)    Made heart race  . Oxycodone Other (See Comments)    lethargic   Review of Systems  Constitutional: Positive for malaise/fatigue. Negative for chills, diaphoresis and fever.  HENT: Negative.   Eyes: Negative.   Respiratory: Positive for shortness of breath. Negative for hemoptysis and wheezing.   Cardiovascular: Positive for chest pain, palpitations and claudication (bilateral leg cramps, night cramps.). Negative for orthopnea, leg swelling and PND.  Gastrointestinal: Negative.   Genitourinary: Negative.   Musculoskeletal: Positive for joint pain.  Skin: Negative.   Neurological: Positive for dizziness (occasional).  Psychiatric/Behavioral: Negative.   All other systems reviewed and are negative.    Blood pressure (!) 117/59, pulse 83, temperature 98.5 F (36.9 C), temperature source Oral, resp. rate (!) 23, height 5' 2"  (1.575 m), weight 87.7 kg, SpO2 96 %. Body mass index is 35.36 kg/m. Physical Exam  Constitutional: She is oriented to person, place, and time.  She is short stage and, moderately obese who appears to be in no acute distress.  Truncal obesity is noted.  Eyes: Conjunctivae are normal.  Neck: Neck supple. No JVD present. No thyromegaly present.  Cardiovascular: Normal rate, regular rhythm and intact distal pulses. Exam reveals no friction rub.  Murmur heard.  Midsystolic murmur is present with a grade of 2/6 at the lower right sternal border. Pulses:      Carotid pulses are on the left side with bruit. Pulmonary/Chest: Effort normal and breath sounds normal.  Abdominal: Soft. Bowel sounds are normal.  Obese, and is present.  Musculoskeletal: Normal range of motion. She exhibits no edema.  Neurological: She is alert and oriented to person, place, and time.  Skin: Skin is warm and dry.  Psychiatric: She has a  normal mood and affect.    Results for orders placed or performed during the hospital encounter of 08/28/17 (from the past 48 hour(s))  Comprehensive metabolic panel     Status: Abnormal   Collection Time: 08/28/17 12:57 PM  Result Value Ref Range   Sodium 135 135 - 145 mmol/L   Potassium 4.1 3.5 - 5.1 mmol/L   Chloride 96 (L) 98 - 111 mmol/L   CO2 29 22 - 32 mmol/L   Glucose, Bld 260 (H) 70 - 99 mg/dL   BUN 24 (H) 8 - 23 mg/dL   Creatinine, Ser 1.11 (H) 0.44 - 1.00 mg/dL   Calcium 10.5 (H) 8.9 - 10.3 mg/dL   Total Protein 6.8 6.5 - 8.1 g/dL   Albumin 3.3 (L) 3.5 - 5.0 g/dL   AST 21 15 - 41 U/L   ALT 23 0 - 44 U/L   Alkaline Phosphatase 31 (L) 38 - 126 U/L   Total Bilirubin 0.9 0.3 -  1.2 mg/dL   GFR calc non Af Amer 48 (L) >60 mL/min   GFR calc Af Amer 55 (L) >60 mL/min    Comment: (NOTE) The eGFR has been calculated using the CKD EPI equation. This calculation has not been validated in all clinical situations. eGFR's persistently <60 mL/min signify possible Chronic Kidney Disease.    Anion gap 10 5 - 15    Comment: Performed at [facility] [address] [place], [place] [address]    Labs:   Lab Results  Component Value Date   WBC 6.0 06/08/2017   HGB 13.2 06/08/2017   HCT 42.0 06/08/2017   MCV 93.3 06/08/2017   PLT 171 06/08/2017    Recent Labs  Lab 08/28/17 1257  NA 135  K 4.1  CL 96*  CO2 29  BUN 24*  CREATININE 1.11*  CALCIUM 10.5*  PROT 6.8  BILITOT 0.9  ALKPHOS 31*  ALT 23  AST 21  GLUCOSE 260*    Lipid Panel     Component Value Date/Time   CHOL 171 10/01/2015 0021   TRIG 516 (H) 10/01/2015 0021   HDL 21 (L) 10/01/2015 0021   CHOLHDL 8.1 10/01/2015 0021   VLDL UNABLE TO CALCULATE IF TRIGLYCERIDE OVER 400 mg/dL 10/01/2015 0021   LDLCALC UNABLE TO CALCULATE IF TRIGLYCERIDE OVER 400 mg/dL 10/01/2015 0021    BNP (last 3 results) Recent Labs    06/07/17 0308  BNP 235.0*    HEMOGLOBIN A1C Lab Results  Component Value Date    HGBA1C 7.3 (H) 09/30/2015   MPG 163 09/30/2015    Cardiac Panel (last 3 results) Recent Labs    06/07/17 0308 06/07/17 0719  TROPONINI 0.03* 0.04*    Lab Results  Component Value Date   CKTOTAL 509 (H) 05/31/2012   CKMB 6.4 (HH) 05/31/2012   TROPONINI 0.04 (HH) 06/07/2017     TSH Recent Labs    10/08/16 1441 11/25/16 1425  TSH 0.05* 0.03*    Medications Prior to Admission  Medication Sig Dispense Refill  . apixaban (ELIQUIS) 5 MG TABS tablet Take 1 tablet (5 mg total) by mouth 2 (two) times daily. 60 tablet 3  . cholecalciferol (VITAMIN D) 1000 UNITS tablet Take 2,000 Units by mouth daily.     . fenofibrate 160 MG tablet Take 160 mg by mouth daily with supper.     . furosemide (LASIX) 40 MG tablet Take 1 tablet (40 mg total) by mouth daily. (Patient taking differently: Take 40 mg by mouth daily. Half tablet 69m daily. If needed patient take whole tablet 491m 30 tablet 0  . insulin NPH-regular Human (NOVOLIN 70/30) (70-30) 100 UNIT/ML injection Inject 35-75 Units into the skin 2 (two) times daily with a meal. Inject 75 units subcutaneously with breakfast and 35 units with supper    . isosorbide mononitrate (IMDUR) 30 MG 24 hr tablet Take 1 tablet (30 mg total) by mouth at bedtime.    . metoprolol tartrate (LOPRESSOR) 50 MG tablet Take 1 tablet (50 mg total) by mouth 2 (two) times daily. 60 tablet 3  . nitroGLYCERIN (NITROSTAT) 0.4 MG SL tablet Place 1 tablet (0.4 mg total) under the tongue every 5 (five) minutes as needed for chest pain. 25 tablet 1  . nystatin cream (MYCOSTATIN) Apply 1 application topically 2 (two) times daily. 30 g 3  . olmesartan (BENICAR) 40 MG tablet Take 1 tablet (40 mg total) by mouth daily. 30 tablet 1  . omega-3 acid ethyl esters (LOVAZA) 1  g capsule Take 2 capsules 2 (two) times daily by mouth.    . oxybutynin (DITROPAN-XL) 5 MG 24 hr tablet Take 1 tablet (5 mg total) by mouth at bedtime. 30 tablet 6  . pravastatin (PRAVACHOL) 10 MG tablet Take 10  mg by mouth daily.    Marland Kitchen spironolactone (ALDACTONE) 25 MG tablet Take 1 tablet every morning by mouth.    . triamcinolone ointment (KENALOG) 0.5 % Apply 1 application topically 2 (two) times daily. 30 g 1      Current Facility-Administered Medications:  .  0.9 %  sodium chloride infusion, 250 mL, Intravenous, PRN, Adrian Prows, MD .  acetaminophen (TYLENOL) tablet 650 mg, 650 mg, Oral, Q4H PRN, Adrian Prows, MD .  apixaban (ELIQUIS) tablet 5 mg, 5 mg, Oral, BID, Adrian Prows, MD .  cholecalciferol (VITAMIN D) tablet 2,000 Units, 2,000 Units, Oral, Daily, Adrian Prows, MD .  fenofibrate tablet 160 mg, 160 mg, Oral, Q supper, Adrian Prows, MD .  furosemide (LASIX) injection 40 mg, 40 mg, Intravenous, Q8H, Adrian Prows, MD .  insulin aspart protamine- aspart (NOVOLOG MIX 70/30) injection 50 Units, 50 Units, Subcutaneous, BID WC, Adrian Prows, MD .  isosorbide mononitrate (IMDUR) 24 hr tablet 30 mg, 30 mg, Oral, QHS, Adrian Prows, MD .  metoprolol tartrate (LOPRESSOR) tablet 50 mg, 50 mg, Oral, BID, Adrian Prows, MD .  nitroGLYCERIN (NITROSTAT) SL tablet 0.4 mg, 0.4 mg, Sublingual, Q5 min PRN, Adrian Prows, MD .  omega-3 acid ethyl esters (LOVAZA) capsule 2 g, 2 capsule, Oral, BID, Adrian Prows, MD .  ondansetron (ZOFRAN) injection 4 mg, 4 mg, Intravenous, Q6H PRN, Adrian Prows, MD .  oxybutynin (DITROPAN-XL) 24 hr tablet 5 mg, 5 mg, Oral, QHS, Adrian Prows, MD .  pravastatin (PRAVACHOL) tablet 10 mg, 10 mg, Oral, Daily, Adrian Prows, MD .  sodium chloride flush (NS) 0.9 % injection 3 mL, 3 mL, Intravenous, Q12H, Adrian Prows, MD .  sodium chloride flush (NS) 0.9 % injection 3 mL, 3 mL, Intravenous, PRN, Adrian Prows, MD .  spironolactone (ALDACTONE) tablet 25 mg, 25 mg, Oral, Daily, Adrian Prows, MD  CARDIAC STUDIES:  EKG 06/07/2017: A paced rhythm at the rate of 74 bpm, left axis deviation, left anterior fascicular blockmm/dd/yyyy poor R-wave progression, cannot exclude anteroseptal infarct old.  Echocardiogram 07/01/2017:  Normal LV systolic function, moderate LVH, grade 3 diastolic dysfunction, moderate left atrial enlargement, moderate MR and mild TR, mild pulmonary hypertension.  Nuclear stress test 06/15/2017: Lexiscan Myoview revealing no stress EKG changes, no evidence of ischemia with normal LVEF.  Assessment/Plan 1.  Paroxysmal atrial fibrillation with rapid ventricular response 2.  Coronary artery disease by angiography on 09/30/2011 revealing circumflex made 50-60% stenosis, 50% mid RCA stenosis. 3.  Atypical chest pain, cannot exclude ACS. 4.  Shortness of breath and dyspnea on exertion, suspect acute on chronic diastolic heart failure. 5.  Diabetes mellitus uncontrolled. 6.  Diabetes mellitus with stage III chronic kidney disease. 7.  Mixed hyperlipidemia 8.  Moderate to severe obstructive sleep apnea, follows Dr. Shelva Majestic, CPAP as and ordered recently. 9.  Sick sinus syndrome, tachycardia-bradycardia syndrome, S/P permanent pacemaker implantation with Medtronic dual-chamber pacemaker on 04/30/2015. 10.  Paroxysmal atrial fibrillation. CHA2DS2-VASCScore: Risk Score  5,  Yearly risk of stroke  6.7.  Recommendation: Patient's presentation is concerning for ACS.she is completely asymptomatic presently, at rest.  We will check serum troponin on admission and again in the morning, I'll start her on diuretics and follow-up BNP.  She will need left  and right heart catheterization to evaluate for progression of coronary artery disease.  Patient's daughter present the bedside, CHILLS were answered.  She will also need hydration in view of renal insufficiency.  Diabetes mellitus continues to be uncontrolled and we discussed dietary modification.  Adrian Prows, MD 08/28/2017, 2:01 PM [facility]. [place] Pager: [phone] Office: [phone] If no answer: Cell:  [phone]

## 2017-08-29 LAB — BASIC METABOLIC PANEL
ANION GAP: 15 (ref 5–15)
Anion gap: 15 (ref 5–15)
BUN: 37 mg/dL — ABNORMAL HIGH (ref 8–23)
BUN: 38 mg/dL — ABNORMAL HIGH (ref 8–23)
CHLORIDE: 92 mmol/L — AB (ref 98–111)
CO2: 27 mmol/L (ref 22–32)
CO2: 27 mmol/L (ref 22–32)
Calcium: 10.1 mg/dL (ref 8.9–10.3)
Calcium: 10.6 mg/dL — ABNORMAL HIGH (ref 8.9–10.3)
Chloride: 92 mmol/L — ABNORMAL LOW (ref 98–111)
Creatinine, Ser: 1.48 mg/dL — ABNORMAL HIGH (ref 0.44–1.00)
Creatinine, Ser: 1.67 mg/dL — ABNORMAL HIGH (ref 0.44–1.00)
GFR calc non Af Amer: 29 mL/min — ABNORMAL LOW (ref >60)
GFR calc non Af Amer: 34 mL/min — ABNORMAL LOW (ref >60)
GFR, EST AFRICAN AMERICAN: 39 mL/min — AB (ref >60)
GFR, EST AFRICAN AMERICAN: 34 mL/min — AB (ref >60)
Glucose, Bld: 270 mg/dL — ABNORMAL HIGH (ref 70–99)
Glucose, Bld: 270 mg/dL — ABNORMAL HIGH (ref 70–99)
POTASSIUM: 4.1 mmol/L (ref 3.5–5.1)
Potassium: 4.2 mmol/L (ref 3.5–5.1)
SODIUM: 134 mmol/L — AB (ref 135–145)
Sodium: 134 mmol/L — ABNORMAL LOW (ref 135–145)

## 2017-08-29 LAB — BRAIN NATRIURETIC PEPTIDE: B Natriuretic Peptide: 466.9 pg/mL — ABNORMAL HIGH (ref 0.0–100.0)

## 2017-08-29 LAB — LIPID PANEL
CHOL/HDL RATIO: 8.8 ratio
CHOLESTEROL: 168 mg/dL (ref 0–200)
HDL: 19 mg/dL — ABNORMAL LOW (ref >40)
LDL Cholesterol: UNDETERMINED mg/dL (ref 0–99)
TRIGLYCERIDES: 544 mg/dL — AB (ref <150)
VLDL: UNDETERMINED mg/dL (ref 0–40)

## 2017-08-29 LAB — GLUCOSE, CAPILLARY
GLUCOSE-CAPILLARY: 120 mg/dL — AB (ref 70–99)
Glucose-Capillary: 266 mg/dL — ABNORMAL HIGH (ref 70–99)
Glucose-Capillary: 336 mg/dL — ABNORMAL HIGH (ref 70–99)
Glucose-Capillary: 233 mg/dL — ABNORMAL HIGH (ref 70–99)

## 2017-08-29 LAB — TROPONIN I: TROPONIN I: 0.13 ng/mL — AB (ref <0.03)

## 2017-08-29 MED ORDER — INSULIN ASPART 100 UNIT/ML ~~LOC~~ SOLN
0.0000 [IU] | Freq: Three times a day (TID) | SUBCUTANEOUS | Status: DC
  Administered 2017-08-29: 5 [IU] via SUBCUTANEOUS
  Administered 2017-08-30: 2 [IU] via SUBCUTANEOUS
  Administered 2017-08-30: 5 [IU] via SUBCUTANEOUS
  Administered 2017-08-31: 2 [IU] via SUBCUTANEOUS
  Administered 2017-08-31: 5 [IU] via SUBCUTANEOUS
  Administered 2017-08-31: 3 [IU] via SUBCUTANEOUS
  Administered 2017-09-01: 2 [IU] via SUBCUTANEOUS

## 2017-08-29 MED ORDER — METOPROLOL TARTRATE 100 MG PO TABS: 100.0000 mg | ORAL_TABLET | Freq: Two times a day (BID) | ORAL | Status: DC

## 2017-08-29 MED ORDER — AMIODARONE HCL 200 MG PO TABS
200.0000 mg | ORAL_TABLET | Freq: Once | ORAL | Status: AC
  Administered 2017-08-29: 200 mg via ORAL

## 2017-08-29 MED ORDER — AMIODARONE HCL 200 MG PO TABS: 400.0000 mg | ORAL_TABLET | Freq: Two times a day (BID) | ORAL | Status: DC

## 2017-08-29 MED ORDER — METOPROLOL TARTRATE 50 MG PO TABS
50.0000 mg | ORAL_TABLET | Freq: Once | ORAL | Status: AC
  Administered 2017-08-29: 50 mg via ORAL

## 2017-08-29 MED ORDER — INSULIN ASPART PROT & ASPART (70-30 MIX) 100 UNIT/ML ~~LOC~~ SUSP
50.0000 [IU] | Freq: Every day | SUBCUTANEOUS | Status: DC
  Administered 2017-08-29 – 2017-08-31 (×3): 50 [IU] via SUBCUTANEOUS
  Filled 2017-08-29: qty 10

## 2017-08-29 MED ORDER — METOPROLOL TARTRATE 100 MG PO TABS
100.0000 mg | ORAL_TABLET | Freq: Two times a day (BID) | ORAL | Status: DC
  Administered 2017-08-29 – 2017-09-01 (×6): 100 mg via ORAL
  Filled 2017-08-29 (×6): qty 1

## 2017-08-29 MED ORDER — AMIODARONE HCL 200 MG PO TABS
400.0000 mg | ORAL_TABLET | Freq: Two times a day (BID) | ORAL | Status: DC
  Administered 2017-08-29 – 2017-09-01 (×6): 400 mg via ORAL
  Filled 2017-08-29 (×6): qty 2

## 2017-08-29 MED ORDER — INSULIN ASPART PROT & ASPART (70-30 MIX) 100 UNIT/ML ~~LOC~~ SUSP
70.0000 [IU] | Freq: Every day | SUBCUTANEOUS | Status: DC
  Administered 2017-08-30 – 2017-09-01 (×3): 70 [IU] via SUBCUTANEOUS
  Filled 2017-08-29: qty 10

## 2017-08-29 NOTE — Progress Notes (Signed)
Subjective:  [name]  is a 72 y.o. female  With hypertension, uncontrolled type II diabetes mellitus, nonobstructive to moderate CAD by catheterization in 2013, hyperlipidemia, stage III chronic kidney disease, moderate obesity with obstructive sleep apnea, has recently been prescribed CPAP which she still hasn't obtained yet, and HFpEF, sick sinus syndrome and paroxysmal atrial fibrillation S/P dual chamber pacemaker implantation in 2017 is admitted to the hospital with about complaints.  Has been in and out of A. FIb with RVR.  Still feels tired and dyspneic.   Objective:  Vital Signs in the last 24 hours: Temp:  [97.6 F (36.4 C)-98.8 F (37.1 C)] 97.6 F (36.4 C) (08/17 0615) Pulse Rate:  [68-99] 84 (08/17 0834) Resp:  [15-23] 18 (08/17 0615) BP: (110-155)/(53-111) 155/111 (08/17 0834) SpO2:  [90 %-96 %] 95 % (08/17 0834) Weight:  [86.3 kg-87.7 kg] 86.3 kg (08/17 0623)  Intake/Output from previous day: 08/16 0701 - 08/17 0700 In: 600 [P.O.:600] Out: 900 [Urine:900]  Physical Exam: Blood pressure (!) 155/111, pulse 84, temperature 97.6 F (36.4 C), temperature source Axillary, resp. rate 18, height 5\' 2"  (1.575 m), weight 86.3 kg, SpO2 95 %. Body mass index is 34.8 kg/m. Physical Exam  Constitutional: She is oriented to person, place, and time.  Short statured, moderately obese in no acute distress.  Eyes: Conjunctivae are normal.  Neck: Neck supple. No JVD present. No thyromegaly present.  Cardiovascular: Normal rate, regular rhythm and intact distal pulses.  Murmur (2/6 midsystolic murmur at the apex.) heard. Pulmonary/Chest: Effort normal and breath sounds normal.  Abdominal: Soft. Bowel sounds are normal.  Musculoskeletal: Normal range of motion. She exhibits no edema.  Neurological: She is alert and oriented to person, place, and time.  Skin: Skin is warm and dry.  Psychiatric: She has a normal mood and affect.   Lab Results: BMP Recent Labs     08/28/17 1257 08/28/17 2348 08/29/17 0328  NA 135 134* 134*  K 4.1 4.1 4.2  CL 96* 92* 92*  CO2 29 27 27   GLUCOSE 260* 270* 270*  BUN 24* 37* 38*  CREATININE 1.11* 1.48* 1.67*  CALCIUM 10.5* 10.6* 10.1  GFRNONAA 48* 34* 29*  GFRAA 55* 39* 34*   HEMOGLOBIN A1C Lab Results  Component Value Date   HGBA1C 7.3 (H) 09/30/2015   MPG 163 09/30/2015    Cardiac Panel (last 3 results) Recent Labs    06/07/17 0719 08/28/17 1848 08/29/17 0328  TROPONINI 0.04* 0.13* 0.13*  TSH Recent Labs    10/08/16 1441 11/25/16 1425 08/28/17 1257  TSH 0.05* 0.03* 2.596    Hepatic Function Panel Recent Labs    10/08/16 1441 06/08/17 0622 08/28/17 1257  PROT 7.2 7.0 6.8  ALBUMIN 4.6 3.4* 3.3*  AST 33 21 21  ALT 34 30 23  ALKPHOS 29* 24* 31*  BILITOT 0.4 0.7 0.9   Imaging: No results found.  Cardiac Studies:  CARDIAC STUDIES:  Telemetry: Paroxysmal episodes of atrial fibrillation with rapid ventricular response.  EKG 06/07/2017: A paced rhythm at the rate of 74 bpm, left axis deviation, left anterior fascicular block. poor R-wave progression, cannot exclude anteroseptal infarct old.  Echocardiogram 07/01/2017: Normal LV systolic function, moderate LVH, grade 3 diastolic dysfunction, moderate left atrial enlargement, moderate MR and mild TR, mild pulmonary hypertension.  Nuclear stress test 06/15/2017: Lexiscan Myoview revealing no stress EKG changes, no evidence of ischemia with normal LVEF.  Assessment/Plan:  1.  Paroxysmal atrial fibrillation with rapid ventricular response 2.  Atypical chest  pain, minimally elevated S. Trop probably due to CHF. Coronary artery disease by angiography on 09/30/2011 revealing circumflex made 50-60% stenosis, 50% mid RCA stenosis. 3.    Acute on chronic diastolic heart failure 4.  Shortness of breath and dyspnea on exertion, suspect acute on chronic diastolic heart failure. 5.  Diabetes mellitus uncontrolled. 6.  Diabetes mellitus with  stage III chronic kidney disease. 7.  Mixed hyperlipidemia 8.  Moderate to severe obstructive sleep apnea, follows Dr. Shelva Majestic, CPAP as and ordered recently. 9.  Sick sinus syndrome, tachycardia-bradycardia syndrome, S/P permanent pacemaker implantation with Medtronic dual-chamber pacemaker on 04/30/2015. 10.  Paroxysmal atrial fibrillation. CHA2DS2-VASCScore: Risk Score  5,  Yearly risk of stroke  6.7.  Recommendation: Patient with recurrent episodes of chest pain, shortness of breath and diastolic heart failure, has multiple cardiovascular risk factors that includes diabetes, renal insufficiency and prior coronary disease.  I will set her up for left and right heart catheterization on Monday morning.  Continue IV diuresis.  She has been started on amiodarone for atrial fibrillation.  I will transition her to IV heparin tomorrow morning. Increase Amio to 400 mg BID due to sustained A. Fib. BP elevated but mostly controlled. DM is uncontrolled, Change insulin dose and add SSI.  Adrian Prows, M.D. 08/29/2017, 9:44 AM [facility] Pager: [phone] Office: [phone] If no answer: [phone]

## 2017-08-29 NOTE — Progress Notes (Signed)
Pt in afib, HR 120-150, sitting up eating breakfast.  BP elevated, SPO2 low (see flow sheet)  O2 applied, Pt assisted to lay back in bed, and anti arrhythmics given a little early.  Will con't to monitor closely and notify MD if persists.

## 2017-08-30 LAB — BRAIN NATRIURETIC PEPTIDE: B Natriuretic Peptide: 203.9 pg/mL — ABNORMAL HIGH (ref 0.0–100.0)

## 2017-08-30 LAB — BASIC METABOLIC PANEL
ANION GAP: 13 (ref 5–15)
BUN: 50 mg/dL — ABNORMAL HIGH (ref 8–23)
CALCIUM: 10.2 mg/dL (ref 8.9–10.3)
CO2: 32 mmol/L (ref 22–32)
Chloride: 91 mmol/L — ABNORMAL LOW (ref 98–111)
Creatinine, Ser: 1.76 mg/dL — ABNORMAL HIGH (ref 0.44–1.00)
GFR calc non Af Amer: 27 mL/min — ABNORMAL LOW (ref >60)
GFR, EST AFRICAN AMERICAN: 32 mL/min — AB (ref >60)
GLUCOSE: 121 mg/dL — AB (ref 70–99)
POTASSIUM: 3.7 mmol/L (ref 3.5–5.1)
Sodium: 136 mmol/L (ref 135–145)

## 2017-08-30 LAB — GLUCOSE, CAPILLARY
GLUCOSE-CAPILLARY: 131 mg/dL — AB (ref 70–99)
GLUCOSE-CAPILLARY: 257 mg/dL — AB (ref 70–99)
Glucose-Capillary: 265 mg/dL — ABNORMAL HIGH (ref 70–99)
Glucose-Capillary: 136 mg/dL — ABNORMAL HIGH (ref 70–99)

## 2017-08-30 LAB — HEPARIN LEVEL (UNFRACTIONATED): HEPARIN UNFRACTIONATED: 1.92 [IU]/mL — AB (ref 0.30–0.70)

## 2017-08-30 LAB — APTT: APTT: 32 s (ref 24–36)

## 2017-08-30 MED ORDER — SODIUM CHLORIDE 0.9 % IV SOLN: 250.0000 mL | INTRAVENOUS | Status: DC | PRN

## 2017-08-30 MED ORDER — SODIUM CHLORIDE 0.9% FLUSH: 3.0000 mL | INTRAVENOUS | Status: DC | PRN

## 2017-08-30 MED ORDER — HEPARIN (PORCINE) IN NACL 100-0.45 UNIT/ML-% IJ SOLN
1300.0000 [IU]/h | INTRAMUSCULAR | Status: DC
  Administered 2017-08-30: 850 [IU]/h via INTRAVENOUS
  Filled 2017-08-30: qty 250

## 2017-08-30 MED ORDER — SODIUM CHLORIDE 0.9 % IV SOLN
INTRAVENOUS | Status: DC
  Administered 2017-08-31: 04:00:00 via INTRAVENOUS

## 2017-08-30 MED ORDER — ASPIRIN 81 MG PO CHEW
81.0000 mg | CHEWABLE_TABLET | ORAL | Status: AC
  Administered 2017-08-31: 81 mg via ORAL
  Filled 2017-08-30: qty 1

## 2017-08-30 MED ORDER — SODIUM CHLORIDE 0.9% FLUSH
3.0000 mL | Freq: Two times a day (BID) | INTRAVENOUS | Status: DC
  Administered 2017-08-30: 3 mL via INTRAVENOUS

## 2017-08-30 NOTE — Progress Notes (Addendum)
ANTICOAGULATION CONSULT NOTE - Initial Consult  Pharmacy Consult for heparin  Indication: atrial fibrillation  Allergies  Allergen Reactions  . Hydrocodone Other (See Comments)    Lethargic   . Invokana [Canagliflozin] Palpitations and Other (See Comments)    Made heart race  . Oxycodone Other (See Comments)    Lethargic     Patient Measurements: Height: 5\' 2"  (157.5 cm) Weight: 190 lb 14.7 oz (86.6 kg) IBW/kg (Calculated) : 50.1 Heparin Dosing Weight: 70.1 kg  Vital Signs: Temp: 97.5 F (36.4 C) (08/18 0617) Temp Source: Oral (08/18 0617) BP: 97/80 (08/18 0617)  Labs: Recent Labs    08/28/17 1848 08/28/17 2348 08/29/17 0328 08/30/17 0301  CREATININE  --  1.48* 1.67* 1.76*  TROPONINI 0.13*  --  0.13*  --     Estimated Creatinine Clearance: 28.6 mL/min (A) (by C-G formula based on SCr of 1.76 mg/dL (H)).   Medical History: Past Medical History:  Diagnosis Date  . Arthritis   . CHF (congestive heart failure) ([place])   . Diabetes mellitus without complication ([place])   . Dyspnea   . History of chickenpox   . Hx of psoriatic arthritis   . Hyperlipemia   . Hypertension   . Mitral valve disorders(424.0)   . Obstructive sleep apnea   . Pacemaker   . Peripheral neuropathy   . Renal disorder Kidney disease stage2  . Thyroid disease hypothyroid  . Urine incontinence   . Vitamin D deficiency     Medications:  Scheduled:  . amiodarone  400 mg Oral BID  . cholecalciferol  2,000 Units Oral Daily  . fenofibrate  160 mg Oral Q supper  . insulin aspart  0-15 Units Subcutaneous TID WC  . insulin aspart protamine- aspart  50 Units Subcutaneous Q supper  . insulin aspart protamine- aspart  70 Units Subcutaneous Q breakfast  . metoprolol tartrate  100 mg Oral BID  . omega-3 acid ethyl esters  2 capsule Oral BID  . oxybutynin  5 mg Oral QHS  . pravastatin  10 mg Oral Daily  . sodium chloride flush  3 mL Intravenous Q12H    Assessment: 42 yof presenting with chest  pain, palpitations, and SOB. On apixaban PTA for Afib RVR - last dose was 8/17@2305 .   Scr 1.76. CBC ordered for tomorrow. No s/sx of bleeding. Will monitor both aPTT and heparin level until correlate.   Goal of Therapy:  Heparin level 0.3-0.7 units/ml aPTT 66-102 seconds Monitor platelets by anticoagulation protocol: Yes   Plan:  Stop apixaban today Start heparin infusion at 850 units/hr today at 1100 (12 hrs from last dose) Check anti-Xa level in 8 hours and daily while on heparin Continue to monitor H&H and platelets F/u for cardiac cath on 8/19  Doylene Canard, PharmD Clinical Pharmacist  Pager: [phone] Phone: [phone] 08/30/2017,10:13 AM  ADDENDUM Heparin level remains falsely elevated at 1.92, aPTT came back subtherapeutic at 32, on 850 units/hr. No s/sx of bleeding. No infusion issues besides being positional. Will increase infusion to 1050 units/hr and obtain heparin level in 8 hours.   Doylene Canard, PharmD Clinical Pharmacist

## 2017-08-30 NOTE — H&P (View-Only) (Signed)
Subjective:  [name]  is a 72 y.o. female  With hypertension, uncontrolled type II diabetes mellitus, nonobstructive to moderate CAD by catheterization in 2013, hyperlipidemia, stage III chronic kidney disease, moderate obesity with obstructive sleep apnea, has recently been prescribed CPAP which she still hasn't obtained yet, and HFpEF, sick sinus syndrome and paroxysmal atrial fibrillation S/P dual chamber pacemaker implantation in 2017 is admitted to the hospital with about complaints.  Has been in and out of A. FIb with RVR.  BU less episodes since yesterday afternoon. Slept well.   Objective:  Vital Signs in the last 24 hours: Temp:  [97.5 F (36.4 C)-98 F (36.7 C)] 97.5 F (36.4 C) (08/18 0617) Pulse Rate:  [82-96] 96 (08/17 2054) Resp:  [18-20] 18 (08/18 0617) BP: (97-128)/(61-98) 97/80 (08/18 0617) SpO2:  [94 %-97 %] 95 % (08/18 0617) Weight:  [86.6 kg] 86.6 kg (08/18 0617)  Intake/Output from previous day: 08/17 0701 - 08/18 0700 In: 1140 [P.O.:1140] Out: 1150 [Urine:1150]  Physical Exam: Blood pressure 97/80, pulse 96, temperature (!) 97.5 F (36.4 C), temperature source Oral, resp. rate 18, height 5\' 2"  (1.575 m), weight 86.6 kg, SpO2 95 %. Body mass index is 34.92 kg/m. Physical Exam  Constitutional: She is oriented to person, place, and time.  Short statured, moderately obese in no acute distress.  Eyes: Conjunctivae are normal.  Neck: Neck supple. No JVD present. No thyromegaly present.  Cardiovascular: Normal rate, regular rhythm and intact distal pulses.  Murmur (2/6 midsystolic murmur at the apex.) heard. Pulmonary/Chest: Effort normal and breath sounds normal.  Abdominal: Soft. Bowel sounds are normal.  Musculoskeletal: Normal range of motion. She exhibits no edema.  Neurological: She is alert and oriented to person, place, and time.  Skin: Skin is warm and dry.  Psychiatric: She has a normal mood and affect.   Lab Results: BMP Recent Labs   08/28/17 2348 08/29/17 0328 08/30/17 0301  NA 134* 134* 136  K 4.1 4.2 3.7  CL 92* 92* 91*  CO2 27 27 32  GLUCOSE 270* 270* 121*  BUN 37* 38* 50*  CREATININE 1.48* 1.67* 1.76*  CALCIUM 10.6* 10.1 10.2  GFRNONAA 34* 29* 27*  GFRAA 39* 34* 32*   HEMOGLOBIN A1C Lab Results  Component Value Date   HGBA1C 7.3 (H) 09/30/2015   MPG 163 09/30/2015    Cardiac Panel (last 3 results) Recent Labs    06/07/17 0719 08/28/17 1848 08/29/17 0328  TROPONINI 0.04* 0.13* 0.13*  TSH Recent Labs    10/08/16 1441 11/25/16 1425 08/28/17 1257  TSH 0.05* 0.03* 2.596    Hepatic Function Panel Recent Labs    10/08/16 1441 06/08/17 0622 08/28/17 1257  PROT 7.2 7.0 6.8  ALBUMIN 4.6 3.4* 3.3*  AST 33 21 21  ALT 34 30 23  ALKPHOS 29* 24* 31*  BILITOT 0.4 0.7 0.9   Imaging: No results found.  Cardiac Studies:  CARDIAC STUDIES:  Telemetry: Paroxysmal episodes of atrial fibrillation with rapid ventricular response.  EKG 06/07/2017: A paced rhythm at the rate of 74 bpm, left axis deviation, left anterior fascicular block. poor R-wave progression, cannot exclude anteroseptal infarct old.  Echocardiogram 07/01/2017: Normal LV systolic function, moderate LVH, grade 3 diastolic dysfunction, moderate left atrial enlargement, moderate MR and mild TR, mild pulmonary hypertension.  Nuclear stress test 06/15/2017: Lexiscan Myoview revealing no stress EKG changes, no evidence of ischemia with normal LVEF.  Assessment/Plan:  1.  Paroxysmal atrial fibrillation with rapid ventricular response 2.  Atypical chest  pain, minimally elevated S. Trop probably due to CHF. Coronary artery disease by angiography on 09/30/2011 revealing circumflex made 50-60% stenosis, 50% mid RCA stenosis. 3.    Acute on chronic diastolic heart failure 4.  Shortness of breath and dyspnea on exertion, suspect acute on chronic diastolic heart failure. 5.  Diabetes mellitus uncontrolled. 6.  Diabetes mellitus with stage  III chronic kidney disease. 7.  Mixed hyperlipidemia 8.  Moderate to severe obstructive sleep apnea, follows Dr. Shelva Majestic, CPAP as and ordered recently. 9.  Sick sinus syndrome, tachycardia-bradycardia syndrome, S/P permanent pacemaker implantation with Medtronic dual-chamber pacemaker on 04/30/2015. 10.  Paroxysmal atrial fibrillation. CHA2DS2-VASCScore: Risk Score  5,  Yearly risk of stroke  6.7.  Recommendation: Patient with recurrent episodes of chest pain, shortness of breath and diastolic heart failure, has multiple cardiovascular risk factors that includes diabetes, renal insufficiency and prior coronary disease. Hopefully she will maintain sinus rhythm.   I will set her up for left and right heart catheterization on Monday morning.  Continue IV diuresis, will hold Eliquis today and start IV heparin. Hold Lasix and spironolactone due to renal failure in preparation for angiogram.   Adrian Prows, M.D. 08/30/2017, 8:55 AM [facility] Pager: [phone] Office: [phone] If no answer: [phone]

## 2017-08-30 NOTE — Progress Notes (Signed)
Subjective:  [name]  is a 72 y.o. female  With hypertension, uncontrolled type II diabetes mellitus, nonobstructive to moderate CAD by catheterization in 2013, hyperlipidemia, stage III chronic kidney disease, moderate obesity with obstructive sleep apnea, has recently been prescribed CPAP which she still hasn't obtained yet, and HFpEF, sick sinus syndrome and paroxysmal atrial fibrillation S/P dual chamber pacemaker implantation in 2017 is admitted to the hospital with about complaints.  Has been in and out of A. FIb with RVR.  BU less episodes since yesterday afternoon. Slept well.   Objective:  Vital Signs in the last 24 hours: Temp:  [97.5 F (36.4 C)-98 F (36.7 C)] 97.5 F (36.4 C) (08/18 0617) Pulse Rate:  [82-96] 96 (08/17 2054) Resp:  [18-20] 18 (08/18 0617) BP: (97-128)/(61-98) 97/80 (08/18 0617) SpO2:  [94 %-97 %] 95 % (08/18 0617) Weight:  [86.6 kg] 86.6 kg (08/18 0617)  Intake/Output from previous day: 08/17 0701 - 08/18 0700 In: 1140 [P.O.:1140] Out: 1150 [Urine:1150]  Physical Exam: Blood pressure 97/80, pulse 96, temperature (!) 97.5 F (36.4 C), temperature source Oral, resp. rate 18, height 5\' 2"  (1.575 m), weight 86.6 kg, SpO2 95 %. Body mass index is 34.92 kg/m. Physical Exam  Constitutional: She is oriented to person, place, and time.  Short statured, moderately obese in no acute distress.  Eyes: Conjunctivae are normal.  Neck: Neck supple. No JVD present. No thyromegaly present.  Cardiovascular: Normal rate, regular rhythm and intact distal pulses.  Murmur (2/6 midsystolic murmur at the apex.) heard. Pulmonary/Chest: Effort normal and breath sounds normal.  Abdominal: Soft. Bowel sounds are normal.  Musculoskeletal: Normal range of motion. She exhibits no edema.  Neurological: She is alert and oriented to person, place, and time.  Skin: Skin is warm and dry.  Psychiatric: She has a normal mood and affect.   Lab Results: BMP Recent Labs   08/28/17 2348 08/29/17 0328 08/30/17 0301  NA 134* 134* 136  K 4.1 4.2 3.7  CL 92* 92* 91*  CO2 27 27 32  GLUCOSE 270* 270* 121*  BUN 37* 38* 50*  CREATININE 1.48* 1.67* 1.76*  CALCIUM 10.6* 10.1 10.2  GFRNONAA 34* 29* 27*  GFRAA 39* 34* 32*   HEMOGLOBIN A1C Lab Results  Component Value Date   HGBA1C 7.3 (H) 09/30/2015   MPG 163 09/30/2015    Cardiac Panel (last 3 results) Recent Labs    06/07/17 0719 08/28/17 1848 08/29/17 0328  TROPONINI 0.04* 0.13* 0.13*  TSH Recent Labs    10/08/16 1441 11/25/16 1425 08/28/17 1257  TSH 0.05* 0.03* 2.596    Hepatic Function Panel Recent Labs    10/08/16 1441 06/08/17 0622 08/28/17 1257  PROT 7.2 7.0 6.8  ALBUMIN 4.6 3.4* 3.3*  AST 33 21 21  ALT 34 30 23  ALKPHOS 29* 24* 31*  BILITOT 0.4 0.7 0.9   Imaging: No results found.  Cardiac Studies:  CARDIAC STUDIES:  Telemetry: Paroxysmal episodes of atrial fibrillation with rapid ventricular response.  EKG 06/07/2017: A paced rhythm at the rate of 74 bpm, left axis deviation, left anterior fascicular block. poor R-wave progression, cannot exclude anteroseptal infarct old.  Echocardiogram 07/01/2017: Normal LV systolic function, moderate LVH, grade 3 diastolic dysfunction, moderate left atrial enlargement, moderate MR and mild TR, mild pulmonary hypertension.  Nuclear stress test 06/15/2017: Lexiscan Myoview revealing no stress EKG changes, no evidence of ischemia with normal LVEF.  Assessment/Plan:  1.  Paroxysmal atrial fibrillation with rapid ventricular response 2.  Atypical chest  pain, minimally elevated S. Trop probably due to CHF. Coronary artery disease by angiography on 09/30/2011 revealing circumflex made 50-60% stenosis, 50% mid RCA stenosis. 3.    Acute on chronic diastolic heart failure 4.  Shortness of breath and dyspnea on exertion, suspect acute on chronic diastolic heart failure. 5.  Diabetes mellitus uncontrolled. 6.  Diabetes mellitus with stage  III chronic kidney disease. 7.  Mixed hyperlipidemia 8.  Moderate to severe obstructive sleep apnea, follows Dr. Shelva Majestic, CPAP as and ordered recently. 9.  Sick sinus syndrome, tachycardia-bradycardia syndrome, S/P permanent pacemaker implantation with Medtronic dual-chamber pacemaker on 04/30/2015. 10.  Paroxysmal atrial fibrillation. CHA2DS2-VASCScore: Risk Score  5,  Yearly risk of stroke  6.7.  Recommendation: Patient with recurrent episodes of chest pain, shortness of breath and diastolic heart failure, has multiple cardiovascular risk factors that includes diabetes, renal insufficiency and prior coronary disease. Hopefully she will maintain sinus rhythm.   I will set her up for left and right heart catheterization on Monday morning.  Continue IV diuresis, will hold Eliquis today and start IV heparin. Hold Lasix and spironolactone due to renal failure in preparation for angiogram.   Adrian Prows, M.D. 08/30/2017, 8:55 AM [facility] Pager: [phone] Office: [phone] If no answer: [phone]

## 2017-08-31 ENCOUNTER — Encounter (HOSPITAL_COMMUNITY)
Admission: AD | Disposition: A | Discharge status: Home / Self Care | Payer: Self-pay | Source: Ambulatory Visit | Attending: Cardiology

## 2017-08-31 ENCOUNTER — Encounter (HOSPITAL_COMMUNITY): Payer: Self-pay | Admitting: Cardiology

## 2017-08-31 HISTORY — PX: RIGHT/LEFT HEART CATH AND CORONARY ANGIOGRAPHY: CATH118266

## 2017-08-31 HISTORY — PX: CORONARY PRESSURE/FFR STUDY: CATH118243

## 2017-08-31 LAB — POCT I-STAT 3, VENOUS BLOOD GAS (G3P V)
Acid-Base Excess: 2 mmol/L (ref 0.0–2.0)
Bicarbonate: 30.4 mmol/L — ABNORMAL HIGH (ref 20.0–28.0)
O2 Saturation: 66 %
PCO2 VEN: 60.1 mmHg — AB (ref 44.0–60.0)
TCO2: 32 mmol/L (ref 22–32)
pH, Ven: 7.311 (ref 7.250–7.430)
pO2, Ven: 38 mmHg (ref 32.0–45.0)

## 2017-08-31 LAB — HEPARIN LEVEL (UNFRACTIONATED): HEPARIN UNFRACTIONATED: 1.4 [IU]/mL — AB (ref 0.30–0.70)

## 2017-08-31 LAB — CBC
HCT: 49.4 % — ABNORMAL HIGH (ref 36.0–46.0)
Hemoglobin: 16.3 g/dL — ABNORMAL HIGH (ref 12.0–15.0)
MCH: 29.7 pg (ref 26.0–34.0)
MCHC: 33 g/dL (ref 30.0–36.0)
MCV: 90.1 fL (ref 78.0–100.0)
PLATELETS: 164 10*3/uL (ref 150–400)
RBC: 5.48 MIL/uL — ABNORMAL HIGH (ref 3.87–5.11)
RDW: 12.7 % (ref 11.5–15.5)
WBC: 8.2 10*3/uL (ref 4.0–10.5)

## 2017-08-31 LAB — BASIC METABOLIC PANEL
Anion gap: 11 (ref 5–15)
BUN: 48 mg/dL — ABNORMAL HIGH (ref 8–23)
CALCIUM: 9.8 mg/dL (ref 8.9–10.3)
CO2: 29 mmol/L (ref 22–32)
Chloride: 95 mmol/L — ABNORMAL LOW (ref 98–111)
Creatinine, Ser: 1.44 mg/dL — ABNORMAL HIGH (ref 0.44–1.00)
GFR, EST AFRICAN AMERICAN: 40 mL/min — AB (ref >60)
GFR, EST NON AFRICAN AMERICAN: 35 mL/min — AB (ref >60)
Glucose, Bld: 198 mg/dL — ABNORMAL HIGH (ref 70–99)
Potassium: 4 mmol/L (ref 3.5–5.1)
SODIUM: 135 mmol/L (ref 135–145)

## 2017-08-31 LAB — POCT I-STAT 3, ART BLOOD GAS (G3+)
ACID-BASE EXCESS: 2 mmol/L (ref 0.0–2.0)
BICARBONATE: 28.3 mmol/L — AB (ref 20.0–28.0)
O2 Saturation: 97 %
PH ART: 7.378 (ref 7.350–7.450)
PO2 ART: 100 mmHg (ref 83.0–108.0)
TCO2: 30 mmol/L (ref 22–32)
pCO2 arterial: 48.1 mmHg — ABNORMAL HIGH (ref 32.0–48.0)

## 2017-08-31 LAB — POCT ACTIVATED CLOTTING TIME
ACTIVATED CLOTTING TIME: 257 s
ACTIVATED CLOTTING TIME: 411 s

## 2017-08-31 LAB — GLUCOSE, CAPILLARY
GLUCOSE-CAPILLARY: 209 mg/dL — AB (ref 70–99)
GLUCOSE-CAPILLARY: 128 mg/dL — AB (ref 70–99)
Glucose-Capillary: 191 mg/dL — ABNORMAL HIGH (ref 70–99)
Glucose-Capillary: 94 mg/dL (ref 70–99)

## 2017-08-31 LAB — APTT: aPTT: 41 seconds — ABNORMAL HIGH (ref 24–36)

## 2017-08-31 SURGERY — RIGHT/LEFT HEART CATH AND CORONARY ANGIOGRAPHY
Anesthesia: LOCAL

## 2017-08-31 MED ORDER — HEPARIN (PORCINE) IN NACL 1000-0.9 UT/500ML-% IV SOLN
INTRAVENOUS | Status: AC
  Filled 2017-08-31: qty 1000

## 2017-08-31 MED ORDER — ACETAMINOPHEN 325 MG PO TABS: 650.0000 mg | ORAL_TABLET | ORAL | Status: DC | PRN

## 2017-08-31 MED ORDER — HEPARIN SODIUM (PORCINE) 1000 UNIT/ML IJ SOLN
INTRAMUSCULAR | Status: AC
  Filled 2017-08-31: qty 1

## 2017-08-31 MED ORDER — ADENOSINE 12 MG/4ML IV SOLN
INTRAVENOUS | Status: AC
  Filled 2017-08-31: qty 16

## 2017-08-31 MED ORDER — ONDANSETRON HCL 4 MG/2ML IJ SOLN: 4.0000 mg | Freq: Four times a day (QID) | INTRAMUSCULAR | Status: DC | PRN

## 2017-08-31 MED ORDER — HEPARIN (PORCINE) IN NACL 1000-0.9 UT/500ML-% IV SOLN
INTRAVENOUS | Status: DC | PRN
  Administered 2017-08-31 (×2): 500 mL

## 2017-08-31 MED ORDER — APIXABAN 5 MG PO TABS
5.0000 mg | ORAL_TABLET | Freq: Two times a day (BID) | ORAL | Status: DC
  Administered 2017-08-31 – 2017-09-01 (×2): 5 mg via ORAL
  Filled 2017-08-31 (×2): qty 1

## 2017-08-31 MED ORDER — ADENOSINE (DIAGNOSTIC) 140MCG/KG/MIN
INTRAVENOUS | Status: DC | PRN
  Administered 2017-08-31: 140 ug/kg/min via INTRAVENOUS

## 2017-08-31 MED ORDER — FENTANYL CITRATE (PF) 100 MCG/2ML IJ SOLN
INTRAMUSCULAR | Status: DC | PRN
  Administered 2017-08-31: 25 ug via INTRAVENOUS
  Administered 2017-08-31: 50 ug via INTRAVENOUS
  Administered 2017-08-31: 25 ug via INTRAVENOUS

## 2017-08-31 MED ORDER — VERAPAMIL HCL 2.5 MG/ML IV SOLN
INTRAVENOUS | Status: DC | PRN
  Administered 2017-08-31: 10 mL via INTRA_ARTERIAL

## 2017-08-31 MED ORDER — NITROGLYCERIN 1 MG/10 ML FOR IR/CATH LAB
INTRA_ARTERIAL | Status: AC
  Filled 2017-08-31: qty 10

## 2017-08-31 MED ORDER — NITROGLYCERIN 1 MG/10 ML FOR IR/CATH LAB
INTRA_ARTERIAL | Status: DC | PRN
  Administered 2017-08-31: 200 ug via INTRACORONARY

## 2017-08-31 MED ORDER — LIDOCAINE HCL (PF) 1 % IJ SOLN
INTRAMUSCULAR | Status: AC
  Filled 2017-08-31: qty 30

## 2017-08-31 MED ORDER — MIDAZOLAM HCL 2 MG/2ML IJ SOLN
INTRAMUSCULAR | Status: AC
  Filled 2017-08-31: qty 2

## 2017-08-31 MED ORDER — SODIUM CHLORIDE 0.9 % IV SOLN: 250.0000 mL | INTRAVENOUS | Status: DC | PRN

## 2017-08-31 MED ORDER — MIDAZOLAM HCL 2 MG/2ML IJ SOLN
INTRAMUSCULAR | Status: DC | PRN
  Administered 2017-08-31 (×2): 0.5 mg via INTRAVENOUS
  Administered 2017-08-31: 1 mg via INTRAVENOUS

## 2017-08-31 MED ORDER — SODIUM CHLORIDE 0.9% FLUSH
3.0000 mL | Freq: Two times a day (BID) | INTRAVENOUS | Status: DC
  Administered 2017-09-01: 3 mL via INTRAVENOUS

## 2017-08-31 MED ORDER — FENTANYL CITRATE (PF) 100 MCG/2ML IJ SOLN
INTRAMUSCULAR | Status: AC
Start: 2017-08-31 — End: ?
  Filled 2017-08-31: qty 2

## 2017-08-31 MED ORDER — VERAPAMIL HCL 2.5 MG/ML IV SOLN
INTRAVENOUS | Status: AC
  Filled 2017-08-31: qty 2

## 2017-08-31 MED ORDER — LIDOCAINE HCL (PF) 1 % IJ SOLN
INTRAMUSCULAR | Status: DC | PRN
  Administered 2017-08-31 (×2): 2 mL

## 2017-08-31 MED ORDER — HEPARIN SODIUM (PORCINE) 1000 UNIT/ML IJ SOLN
INTRAMUSCULAR | Status: DC | PRN
  Administered 2017-08-31: 2000 [IU] via INTRAVENOUS
  Administered 2017-08-31: 5000 [IU] via INTRAVENOUS
  Administered 2017-08-31 (×2): 2000 [IU] via INTRAVENOUS

## 2017-08-31 MED ORDER — IOHEXOL 350 MG/ML SOLN
INTRAVENOUS | Status: DC | PRN
  Administered 2017-08-31: 100 mL

## 2017-08-31 MED ORDER — VERAPAMIL HCL 2.5 MG/ML IV SOLN
INTRAVENOUS | Status: DC | PRN
  Administered 2017-08-31: 5 mL via INTRA_ARTERIAL

## 2017-08-31 MED ORDER — SODIUM CHLORIDE 0.9 % IV SOLN: INTRAVENOUS | Status: AC

## 2017-08-31 MED ORDER — SODIUM CHLORIDE 0.9% FLUSH: 3.0000 mL | INTRAVENOUS | Status: DC | PRN

## 2017-08-31 SURGICAL SUPPLY — 24 items
CATH 5FR JL3.5 JR4 ANG PIG MP (CATHETERS) ×1 IMPLANT
CATH BALLN WEDGE 5F 110CM (CATHETERS) ×1 IMPLANT
CATH INFINITI 5FR AL1 (CATHETERS) ×1 IMPLANT
CATH LAUNCHER 5F JL3 (CATHETERS) IMPLANT
CATH LAUNCHER 5F RADL (CATHETERS) IMPLANT
CATH LAUNCHER 6FR AL1 (CATHETERS) IMPLANT
CATHETER LAUNCHER 5F JL3 (CATHETERS) ×2
CATHETER LAUNCHER 5F RADL (CATHETERS) ×2
CATHETER LAUNCHER 6FR AL1 (CATHETERS) ×2
DEVICE RAD COMP TR BAND LRG (VASCULAR PRODUCTS) ×1 IMPLANT
GLIDESHEATH SLEND A-KIT 6F 22G (SHEATH) ×1 IMPLANT
GUIDEWIRE .025 260CM (WIRE) ×1 IMPLANT
GUIDEWIRE INQWIRE 1.5J.035X260 (WIRE) IMPLANT
GUIDEWIRE PRESSURE COMET II (WIRE) ×1 IMPLANT
INQWIRE 1.5J .035X260CM (WIRE) ×2
KIT ESSENTIALS PG (KITS) ×1 IMPLANT
KIT HEART LEFT (KITS) ×2 IMPLANT
PACK CARDIAC CATHETERIZATION (CUSTOM PROCEDURE TRAY) ×2 IMPLANT
SHEATH GLIDE SLENDER 4/5FR (SHEATH) ×1 IMPLANT
SHEATH PROBE COVER 6X72 (BAG) ×1 IMPLANT
TRANSDUCER W/STOPCOCK (MISCELLANEOUS) ×2 IMPLANT
TUBING CIL FLEX 10 FLL-RA (TUBING) ×2 IMPLANT
WIRE HI TORQ VERSACORE-J 145CM (WIRE) ×1 IMPLANT
WIRE MICROINTRODUCER 60CM (WIRE) ×1 IMPLANT

## 2017-08-31 NOTE — Interval H&P Note (Signed)
History and Physical Interval Note:  08/31/2017 8:39 AM  [name]  has presented today for surgery, with the diagnosis of chest pain  The various methods of treatment have been discussed with the patient and family. After consideration of risks, benefits and other options for treatment, the patient has consented to  Procedure(s): RIGHT/LEFT HEART CATH AND CORONARY ANGIOGRAPHY (N/A) as a surgical intervention .  The patient's history has been reviewed, patient examined, no change in status, stable for surgery.  I have reviewed the patient's chart and labs.  Questions were answered to the patient's satisfaction.    2016/2017 Appropriate Use Criteria for Coronary Revascularization Clinical Presentation: Diabetes Mellitus? Symptom Status? S/P CABG? Antianginal Therapy (# of long-acting drugs)? Results of Non-invasive testing? FFR/iFR results in all diseased vessels? Patient undergoing renal transplant? Patient undergoing percutaneous valve procedure (TAVR, MitraClip, Others)? Symptom Status:  Ischemic Symptoms  Non-invasive Testing:  Low risk  If no or indeterminate stress test, FFR/iFR results in all diseased vessels:  N/A  Diabetes Mellitus:  No  S/P CABG:  No  Antianginal therapy (number of long-acting drugs):  >=2  Patient undergoing renal transplant:  No  Patient undergoing percutaneous valve procedure:  No    newline 1 Vessel Disease PCI CABG  No proximal LAD involvement, No proximal left dominant LCX involvement A (7); Indication 1 M (5); Indication 1   Proximal left dominant LCX involvement A (7); Indication 4 A (7); Indication 4   Proximal LAD involvement A (7); Indication 4 A (7); Indication 4   newline 2 Vessel Disease  No proximal LAD involvement A (7); Indication 7 M (6); Indication 7   Proximal LAD involvement A (7); Indication 10 A (7); Indication 10   newline 3 Vessel Disease  Low disease complexity (e.g., focal stenoses, SYNTAX <=22) A (7); Indication 16 A (7);  Indication 16   Intermediate or high disease complexity (e.g., SYNTAX >=23) M (6); Indication 20 A (8); Indication 20   newline Left Main Disease  Isolated LMCA disease: ostial or midshaft A (7); Indication 24 A (9); Indication 24   Isolated LMCA disease: bifurcation involvement M (6); Indication 25 A (9); Indication 25   LMCA ostial or midshaft, concurrent low disease burden multivessel disease (e.g., 1-2 additional focal stenoses, SYNTAX <=22) A (7); Indication 26 A (9); Indication 26   LMCA ostial or midshaft, concurrent intermediate or high disease burden multivessel disease (e.g., 1-2 additional bifurcation stenoses, long stenoses, SYNTAX >=23) M (4); Indication 27 A (9); Indication 27   LMCA bifurcation involvement, concurrent low disease burden multivessel disease (e.g., 1-2 additional focal stenoses, SYNTAX <=22) M (6); Indication 28 A (9); Indication 28   LMCA bifurcation involvement, concurrent intermediate or high disease burden multivessel disease (e.g., 1-2 additional bifurcation stenoses, long stenoses, SYNTAX >=23) R (3); Indication 29 A (9); Indication 29    2012 Appropriate Use Criteria for Diagnostic Catheterization Home / Select Test of Interest Indication for RHC Pulmonary Hypertension Pulmonary Hypertension (Right Heart Catheterization) Pulmonary Hypertension  (Right Heart Catheterization) Link Here: [email or web address] Indication:  1. Suspected pulmonary hypertension 2. Elevated estimated right ventricular     Thadeus Gandolfi J Maylyn Narvaiz

## 2017-08-31 NOTE — Progress Notes (Addendum)
Pt w/ significant O2 desat with sleep- 80-85%. Placed on 2L [place] while sleeping. Sats up to 95%. Will continue to monitor.

## 2017-08-31 NOTE — Progress Notes (Signed)
ANTICOAGULATION CONSULT NOTE  Pharmacy Consult:  Heparin Indication: atrial fibrillation  Allergies  Allergen Reactions  . Hydrocodone Other (See Comments)    Lethargic   . Invokana [Canagliflozin] Palpitations and Other (See Comments)    Made heart race  . Oxycodone Other (See Comments)    Lethargic     Patient Measurements: Height: 5\' 2"  (157.5 cm) Weight: 193 lb 3.2 oz (87.6 kg) IBW/kg (Calculated) : 50.1 Heparin Dosing Weight: 70 kg  Vital Signs: Temp: 98 F (36.7 C) (08/19 0418) Temp Source: Oral (08/19 0418) BP: 109/73 (08/19 0418) Pulse Rate: 69 (08/19 0418)  Labs: Recent Labs    08/28/17 1848  08/29/17 0328 08/30/17 0301 08/30/17 1836 08/30/17 1837 08/31/17 0442  HGB  --   --   --   --   --   --  16.3*  HCT  --   --   --   --   --   --  49.4*  PLT  --   --   --   --   --   --  164  APTT  --   --   --   --   --  32 41*  HEPARINUNFRC  --   --   --   --  1.92*  --  1.40*  CREATININE  --    < > 1.67* 1.76*  --   --  1.44*  TROPONINI 0.13*  --  0.13*  --   --   --   --    < > = values in this interval not displayed.    Estimated Creatinine Clearance: 35.2 mL/min (A) (by C-G formula based on SCr of 1.44 mg/dL (H)).    Assessment: 1 YOF presented with chest pain, palpitations, and SOB. On apixaban PTA for Afib RVR - last dose was 08/29/17 at 2305.   Heparin level falsely elevated and trending down.  Currently using aPTT to guide heparin dosing and it is sub-therapeutic.  No bleeding reported.   Goal of Therapy:  Heparin level 0.3-0.7 units/ml aPTT 66-102 seconds Monitor platelets by anticoagulation protocol: Yes    Plan:  Increase heparin gtt to 1300 units/hr Check 8 hr aPTT Daily heparin level, aPTT and CBC F/U post cath   Velvet Moomaw D. Mina Marble, PharmD, BCPS, [place] 08/31/2017, 7:29 AM

## 2017-08-31 NOTE — Discharge Instructions (Signed)

## 2017-08-31 NOTE — Progress Notes (Signed)
Subjective:  Feeling better. Breathing improved.   Objective:  Vital Signs in the last 24 hours: Temp:  [97.4 F (36.3 C)-98 F (36.7 C)] 97.4 F (36.3 C) (08/19 1139) Pulse Rate:  [0-79] 72 (08/19 1300) Resp:  [0-19] 0 (08/19 1114) BP: (70-144)/(42-94) 118/57 (08/19 1300) SpO2:  [0 %-99 %] 94 % (08/19 1300) Weight:  [87.6 kg] 87.6 kg (08/19 0500)  Intake/Output from previous day: 08/18 0701 - 08/19 0700 In: 745.1 [P.O.:600; I.V.:145.1] Out: 1750 [Urine:1750] Intake/Output from this shift: No intake/output data recorded.  Physical Exam: Blood pressure 97/80, pulse 96, temperature (!) 97.5 F (36.4 C), temperature source Oral, resp. rate 18, height 5\' 2"  (1.575 m), weight 86.6 kg, SpO2 95 %. Body mass index is 34.92 kg/m. Physical Exam  Constitutional: She is oriented to person, place, and time.  Short statured, moderately obese in no acute distress.  Eyes: Conjunctivae are normal.  Neck: Neck supple. No JVD present. No thyromegaly present.  Cardiovascular: Normal rate, regular rhythm and intact distal pulses.  Murmur (2/6 midsystolic murmur at the apex.) heard. Pulmonary/Chest: Effort normal and breath sounds normal.  Abdominal: Soft. Bowel sounds are normal.  Musculoskeletal: Normal range of motion. She exhibits no edema.  Neurological: She is alert and oriented to person, place, and time.  Skin: Skin is warm and dry.  Psychiatric: She has a normal mood and affect.   Lab Results: Recent Labs    08/31/17 0442  WBC 8.2  HGB 16.3*  PLT 164   Recent Labs    08/30/17 0301 08/31/17 0442  NA 136 135  K 3.7 4.0  CL 91* 95*  CO2 32 29  GLUCOSE 121* 198*  BUN 50* 48*  CREATININE 1.76* 1.44*   Recent Labs    08/29/17 0328  TROPONINI 0.13*   Hepatic Function Panel No results for input(s): PROT, ALBUMIN, AST, ALT, ALKPHOS, BILITOT, BILIDIR, IBILI in the last 72 hours. Recent Labs    08/29/17 0328  CHOL 168    Cardiac Studies: CARDIAC STUDIES:  LM:  Normal LAD: Minimal luminal irregularities LCx: Nondominant. AV grove LCx 50-60% diffuse disease RCA: Large dominant. Ostial PDA 50% stenosis, FFR 0.96.  RA: 5 mmHg RV: 45/4 mmHg.  PA: 40/12 mmHg. Mean PA 29 mmHg PCWP: 16 mmHg LV: 130/3 mmHg, LVEDP 13 mmHg  CO: 3.6 L/min. 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  EKG 06/07/2017: A paced rhythm at the rate of 74 bpm, left axis deviation, left anterior fascicular block. poor R-wave progression, cannot exclude anteroseptal infarct old.  Echocardiogram 07/01/2017: Normal LV systolic function, moderate LVH, grade 3 diastolic dysfunction, moderate left atrial enlargement, moderate MR and mild TR, mild pulmonary hypertension.  Nuclear stress test 06/15/2017: Mild inferolateral ischemia.   Assessment: 72 year old Caucasian female with hypertension, type 2 diabetes mellitus, nonobstructive CAD by coronary angiography in 2013, sick sinus syndrome status post dual-chamber pacemaker 2017 hyperlipidemia, stage III chronic kidney disease, OSA, , admitted to the hospital on 08/28/2017 with episodes of palpitations, chest pain and shortness of breath  Acute on chronic diastolic heart failure:  Improving. Will resume spironolactone outpatient.  Resume torsemide 20 mg PO bid on discharge.   Paroxysmal Afib: Currently in sinus rhythm CHA2DS2-VASCScore: Risk Score5, Yearly risk of stroke 6.7%.  Resume eliquis on 08/20. Recommend amiodarone 200 mg bid on discharge. Continue metoprolol 100 mg PO bid.   CAD: Nonobstructive  Pulmonary hypertensin: WHO Grp II/possibly  III Will need OSA treated with CPAP  Sinus node dysfunction: S/p dual chamber pacemaker: Normal functioning  Hypertension: Controlled  Type 2 DM:   LOS: 3 days    Manish J Patwardhan 08/31/2017, 7:22 PM  Manish Esther Hardy, MD [facility] Pager: [phone] Office: [phone] If no answer Cell [phone]

## 2017-09-01 LAB — CBC
HEMATOCRIT: 45.1 % (ref 36.0–46.0)
HEMOGLOBIN: 14.4 g/dL (ref 12.0–15.0)
MCH: 29.4 pg (ref 26.0–34.0)
MCHC: 31.9 g/dL (ref 30.0–36.0)
MCV: 92.2 fL (ref 78.0–100.0)
Platelets: 135 10*3/uL — ABNORMAL LOW (ref 150–400)
RBC: 4.89 MIL/uL (ref 3.87–5.11)
RDW: 12.8 % (ref 11.5–15.5)
WBC: 7.9 10*3/uL (ref 4.0–10.5)

## 2017-09-01 LAB — BASIC METABOLIC PANEL
Anion gap: 8 (ref 5–15)
BUN: 29 mg/dL — ABNORMAL HIGH (ref 8–23)
CHLORIDE: 101 mmol/L (ref 98–111)
CO2: 26 mmol/L (ref 22–32)
CREATININE: 1.27 mg/dL — AB (ref 0.44–1.00)
Calcium: 9.2 mg/dL (ref 8.9–10.3)
GFR calc Af Amer: 47 mL/min — ABNORMAL LOW (ref >60)
GFR calc non Af Amer: 41 mL/min — ABNORMAL LOW (ref >60)
Glucose, Bld: 114 mg/dL — ABNORMAL HIGH (ref 70–99)
POTASSIUM: 4.4 mmol/L (ref 3.5–5.1)
SODIUM: 135 mmol/L (ref 135–145)

## 2017-09-01 LAB — GLUCOSE, CAPILLARY
GLUCOSE-CAPILLARY: 85 mg/dL (ref 70–99)
Glucose-Capillary: 61 mg/dL — ABNORMAL LOW (ref 70–99)
Glucose-Capillary: 135 mg/dL — ABNORMAL HIGH (ref 70–99)

## 2017-09-01 MED ORDER — TORSEMIDE 20 MG PO TABS: 20.0000 mg | ORAL_TABLET | ORAL | 3 refills | Status: DC | PRN

## 2017-09-01 MED ORDER — AMIODARONE HCL 200 MG PO TABS: 200.0000 mg | ORAL_TABLET | Freq: Two times a day (BID) | ORAL | 3 refills | Status: DC

## 2017-09-01 MED ORDER — TORSEMIDE 20 MG PO TABS: 20.0000 mg | ORAL_TABLET | Freq: Every day | ORAL | 3 refills | Status: DC

## 2017-09-01 NOTE — Plan of Care (Signed)
  Problem: Health Behavior/Discharge Planning: Goal: Ability to manage health-related needs will improve Outcome: Adequate for Discharge   Problem: Clinical Measurements: Goal: Ability to maintain clinical measurements within normal limits will improve Outcome: Adequate for Discharge Goal: Will remain free from infection Outcome: Adequate for Discharge Goal: Diagnostic test results will improve Outcome: Adequate for Discharge Goal: Respiratory complications will improve Outcome: Adequate for Discharge Goal: Cardiovascular complication will be avoided Outcome: Adequate for Discharge   Problem: Activity: Goal: Risk for activity intolerance will decrease Outcome: Adequate for Discharge   Problem: Coping: Goal: Level of anxiety will decrease Outcome: Adequate for Discharge   Problem: Activity: Goal: Capacity to carry out activities will improve Outcome: Adequate for Discharge   Problem: Cardiac: Goal: Ability to achieve and maintain adequate cardiopulmonary perfusion will improve Outcome: Adequate for Discharge

## 2017-09-01 NOTE — Care Management Note (Addendum)
Case Management Note  Patient Details  Name: [name] MRN: [id number] Date of Birth: March 24, 1945  Subjective/Objective:                 Patient admitted with Afib   Action/Plan:  Plan for patient to DC to home w spouse. Discussed medications and cost. Patient states that her recently introduced her to [employer] and this has been helping with her copay cost since she hit the "donut hole." She states that she has applied for SS help with medications and her daughter assists with managing her health needs, denies difficulty getting to MD.  Patient admits that she does not weigh daily, but understands why it is important and plans to start at DC.  Expected Discharge Date:  09/01/17               Expected Discharge Plan:  Home/Self Care  In-House Referral:     Discharge planning Services  CM Consult  Post Acute Care Choice:    Choice offered to:     DME Arranged:   oxygen DME Agency:   [facility]  HH Arranged:    HH Agency:     Status of Service:  Completed, signed off  If discussed at [employer] of Stay Meetings, dates discussed:    Additional Comments:  Carles Collet, RN 09/01/2017, 9:47 AM

## 2017-09-01 NOTE — Progress Notes (Signed)
Hypoglycemic Event  CBG: 61  Treatment: 240 mLs OJ  Symptoms: none  Follow-up CBG: [id number]:[id number] CBG Result:85  Possible Reasons for Event: low pm intake, on 70/30  Comments/MD notified:n/a    [name]

## 2017-09-01 NOTE — Progress Notes (Signed)
SATURATION QUALIFICATIONS: (This note is used to comply with regulatory documentation for home oxygen)  Patient Saturations on Room Air at Rest = 88%  Patient Saturations on Room Air while Ambulating = NA  Patient Saturations on 0 Liters of oxygen while Ambulating = NA  Please briefly explain why patient needs home oxygen: Desaturates at rest without activity

## 2017-09-01 NOTE — Discharge Summary (Signed)
Physician Discharge Summary  Patient ID: [name] MRN: [id number] DOB/AGE: [birth year] 71 y.o.  Admit date: 08/28/2017 Discharge date: 09/01/2017  Admission Diagnoses: Shortness of breath  Discharge Diagnoses:  Active Problems:   Chronic diastolic heart failure (HCC)   Atrial fibrillation (HCC)   Dyspnea on exertion   Discharged Condition: good  Hospital Course:   72 year old Caucasian female with hypertension, type 2 diabetes mellitus, nonobstructive CAD by coronary angiography in 2013, sick sinus syndrome status post dual-chamber pacemaker 2017 hyperlipidemia, stage III chronic kidney disease, OSA, , admitted to the hospital on 08/28/2017 with episodes of palpitations, chest pain and shortness of breath. Patient had episodes of A. fib with RVR, along with mildly elevated troponin. Patient recently had a low risk stress test showing small area of ischemia and PDA territory. Given patient's presentation and possible non-STEMI, she was recommended to undergo left heart catheterization with coronary angiography, as well as right heart catheterization given her pulmonary hypertension and shortness of breath.  Cath showed mild PH, nonobstructive CAD. Patient felt better with diuresis. She has maintained sinus rhythm with amiodarone. I will hold spironolactone on discharge given recent contrast use and relatively low blood pressure., and probably resume on follow up.  Consults: None  Significant Diagnostic Studies: Labs Results for [name], Brown (MRN [id number]) as of 09/01/2017 10:13  Ref. Range 08/31/2017 04:42 09/01/2017 03:13  WBC Latest Ref Range: 4.0 - 10.5 K/uL 8.2 7.9  RBC Latest Ref Range: 3.87 - 5.11 MIL/uL 5.48 (H) 4.89  Hemoglobin Latest Ref Range: 12.0 - 15.0 g/dL 16.3 (H) 14.4  HCT Latest Ref Range: 36.0 - 46.0 % 49.4 (H) 45.1  MCV Latest Ref Range: 78.0 - 100.0 fL 90.1 92.2  MCH Latest Ref Range: 26.0 - 34.0 pg 29.7 29.4  MCHC Latest Ref Range: 30.0 - 36.0 g/dL 33.0  31.9  RDW Latest Ref Range: 11.5 - 15.5 % 12.7 12.8  Platelets Latest Ref Range: 150 - 400 K/uL 164 135 (L)   Results for [name], [name] (MRN [id number]) as of 09/01/2017 10:13  Ref. Range 08/31/2017 04:42 09/01/2017 51:70  BASIC METABOLIC PANEL Unknown Rpt (A) Rpt (A)  Sodium Latest Ref Range: 135 - 145 mmol/L 135 135  Potassium Latest Ref Range: 3.5 - 5.1 mmol/L 4.0 4.4  Chloride Latest Ref Range: 98 - 111 mmol/L 95 (L) 101  CO2 Latest Ref Range: 22 - 32 mmol/L 29 26  Glucose Latest Ref Range: 70 - 99 mg/dL 198 (H) 114 (H)  BUN Latest Ref Range: 8 - 23 mg/dL 48 (H) 29 (H)  Creatinine Latest Ref Range: 0.44 - 1.00 mg/dL 1.44 (H) 1.27 (H)  Calcium Latest Ref Range: 8.9 - 10.3 mg/dL 9.8 9.2  Anion gap Latest Ref Range: 5 - 15  11 8   GFR, Est Non African American Latest Ref Range: >60 mL/min 35 (L) 41 (L)  GFR, Est African American Latest Ref Range: >60 mL/min 40 (L) 47 (L)   Treatments:  IV diuresis  Discharge Exam: Blood pressure 125/86, pulse 72, temperature 97.8 F (36.6 C), temperature source Oral, resp. rate 18, height 5\' 2"  (1.575 m), weight 88.1 kg, SpO2 93 %. Physical Exam Constitutional: She isoriented to person, place, and time. Short statured, moderately obese in no acute distress. Eyes:Conjunctivaeare normal.  Neck:Neck supple.No JVDpresent. No thyromegalypresent.  Cardiovascular:Normal rate,regular rhythmand intact distal pulses. YFVCBS(4/9 midsystolic murmur at the apex.)heard. Pulmonary/Chest:Effort normaland breath sounds normal.  Abdominal:Soft.Bowel sounds are normal.  Musculoskeletal:Normal range of motion. She exhibits noedema.  Neurological: She  isalertand oriented to person, place, and time.  Skin: Skin iswarmand dry.  Psychiatric: She has anormal mood and affect.  Disposition: Discharge disposition: 01-Home or Self Care       Discharge Instructions    Diet - low sodium heart healthy   Complete by:  As directed     Increase activity slowly   Complete by:  As directed      Allergies as of 09/01/2017      Reactions   Hydrocodone Other (See Comments)   Lethargic   Invokana [canagliflozin] Palpitations, Other (See Comments)   Made heart race   Oxycodone Other (See Comments)   Lethargic      Medication List    STOP taking these medications   acetaminophen 500 MG tablet Commonly known as:  TYLENOL   furosemide 40 MG tablet Commonly known as:  LASIX   olmesartan 40 MG tablet Commonly known as:  BENICAR   spironolactone 25 MG tablet Commonly known as:  ALDACTONE     TAKE these medications   amiodarone 200 MG tablet Commonly known as:  PACERONE Take 1 tablet (200 mg total) by mouth 2 (two) times daily.   apixaban 5 MG Tabs tablet Commonly known as:  ELIQUIS Take 1 tablet (5 mg total) by mouth 2 (two) times daily. What changed:    when to take this  additional instructions   fenofibrate 160 MG tablet Take 160 mg by mouth daily with supper.   isosorbide mononitrate 30 MG 24 hr tablet Commonly known as:  IMDUR Take 1 tablet (30 mg total) by mouth at bedtime.   levothyroxine 50 MCG tablet Commonly known as:  SYNTHROID, LEVOTHROID Take 50 mcg by mouth daily before breakfast.   metoprolol tartrate 50 MG tablet Commonly known as:  LOPRESSOR Take 1 tablet (50 mg total) by mouth 2 (two) times daily. What changed:    when to take this  additional instructions   nitroGLYCERIN 0.4 MG SL tablet Commonly known as:  NITROSTAT Place 1 tablet (0.4 mg total) under the tongue every 5 (five) minutes as needed for chest pain.   NOVOLOG MIX 70/30 (70-30) 100 UNIT/ML injection Generic drug:  insulin aspart protamine- aspart Inject 46-76 Units into the skin See admin instructions. Inject 76 units into the skin with breakfast and 46 units with supper   nystatin cream Commonly known as:  MYCOSTATIN Apply 1 application topically 2 (two) times daily. What changed:    when to take  this  reasons to take this   olmesartan-hydrochlorothiazide 40-25 MG tablet Commonly known as:  BENICAR HCT Take 1 tablet by mouth daily.   omega-3 acid ethyl esters 1 g capsule Commonly known as:  LOVAZA Take 2 capsules 2 (two) times daily by mouth.   oxybutynin 5 MG 24 hr tablet Commonly known as:  DITROPAN-XL Take 1 tablet (5 mg total) by mouth at bedtime.   rOPINIRole 0.5 MG tablet Commonly known as:  REQUIP Take 0.5 mg by mouth See admin instructions. Take 0.5 mg by mouth 1-3 hours before bed   simvastatin 20 MG tablet Commonly known as:  ZOCOR Take 20 mg by mouth daily after supper.   torsemide 20 MG tablet Commonly known as:  DEMADEX Take 1 tablet (20 mg total) by mouth as needed. As needed for leg edema   triamcinolone ointment 0.5 % Commonly known as:  KENALOG Apply 1 application topically 2 (two) times daily. What changed:    when to take this  reasons to take this  Vitamin D-3 1000 units Caps Take 2,000 Units by mouth daily.      Follow-up Information    Nigel Mormon, MD Follow up on 09/07/2017.   Specialty:  Cardiology Why:  2:15 PM Contact information: [place] [address] [phone]           Signed: Nigel Mormon 09/01/2017, 9:41 AM  Nigel Mormon, MD [facility] Pager: [phone] Office: [phone] If no answer Cell [phone]

## 2017-09-01 NOTE — Progress Notes (Signed)
Discharge instructions reviewed with patient and daughter and no questions. Discharged with O2 tanks in possession and all belongings via wheelchair to family vehicle.

## 2017-09-03 ENCOUNTER — Telehealth: Payer: Self-pay | Admitting: Cardiovascular Disease

## 2017-09-03 DIAGNOSIS — G4731 Primary central sleep apnea: Secondary | ICD-10-CM

## 2017-09-03 NOTE — Telephone Encounter (Signed)
New Message         Per patient her insurance will not pay for c-pap machine so she is asking that we cancelled it. However her insurance will pay for a by-pap machine. Pls advise.

## 2017-09-03 NOTE — Telephone Encounter (Addendum)
Called [facility] to clarify, will call back with more information  [facility] called back-sleep study states and optimal PAP pressure could not be selected for this patient based on available data.  Since patient was on Bipap previously and this study did not find an optimal pressure, insurance requires patient go for BIPAP titration.    Spoke to patient and updated, aware will make Dr. Claiborne Billings and aware and will call back next week.

## 2017-09-04 NOTE — Telephone Encounter (Signed)
Refer back for BiPAP titation

## 2017-09-07 DIAGNOSIS — I10 Essential (primary) hypertension: Secondary | ICD-10-CM | POA: Diagnosis not present

## 2017-09-07 DIAGNOSIS — I5032 Chronic diastolic (congestive) heart failure: Secondary | ICD-10-CM | POA: Diagnosis not present

## 2017-09-07 DIAGNOSIS — I2510 Atherosclerotic heart disease of native coronary artery without angina pectoris: Secondary | ICD-10-CM | POA: Diagnosis not present

## 2017-09-07 DIAGNOSIS — I739 Peripheral vascular disease, unspecified: Secondary | ICD-10-CM | POA: Diagnosis not present

## 2017-09-08 NOTE — Telephone Encounter (Signed)
Patient aware and verbalized understanding.   She request to have this titration at [facility].

## 2017-09-11 ENCOUNTER — Telehealth: Payer: Self-pay | Admitting: *Deleted

## 2017-09-11 NOTE — Telephone Encounter (Signed)
PA for BIPAP titration has been sent via web portal to [facility].

## 2017-09-15 ENCOUNTER — Telehealth: Payer: Self-pay | Admitting: *Deleted

## 2017-09-15 NOTE — Telephone Encounter (Signed)
Patient notified of BIPAP titration appointment scheduled for 09/18/17 @ [facility].

## 2017-09-17 DIAGNOSIS — R002 Palpitations: Secondary | ICD-10-CM | POA: Diagnosis not present

## 2017-09-17 DIAGNOSIS — I5030 Unspecified diastolic (congestive) heart failure: Secondary | ICD-10-CM | POA: Diagnosis not present

## 2017-09-17 DIAGNOSIS — Z950 Presence of cardiac pacemaker: Secondary | ICD-10-CM | POA: Diagnosis not present

## 2017-09-17 DIAGNOSIS — I2510 Atherosclerotic heart disease of native coronary artery without angina pectoris: Secondary | ICD-10-CM | POA: Diagnosis not present

## 2017-09-18 ENCOUNTER — Ambulatory Visit (HOSPITAL_BASED_OUTPATIENT_CLINIC_OR_DEPARTMENT_OTHER): Payer: Medicare HMO | Attending: Cardiovascular Disease | Admitting: Cardiovascular Disease

## 2017-09-18 DIAGNOSIS — G4733 Obstructive sleep apnea (adult) (pediatric): Secondary | ICD-10-CM | POA: Diagnosis not present

## 2017-09-18 DIAGNOSIS — G4731 Primary central sleep apnea: Secondary | ICD-10-CM

## 2017-09-30 ENCOUNTER — Telehealth: Payer: Self-pay

## 2017-09-30 DIAGNOSIS — I5032 Chronic diastolic (congestive) heart failure: Secondary | ICD-10-CM

## 2017-09-30 NOTE — Telephone Encounter (Signed)
Faxed orders from Dr. Nigel Mormon, with [facility]. Orders to be scanned in system. Epic orders placed for Cardiac MRI r/t Congestive Heart Failure

## 2017-10-02 DIAGNOSIS — I5033 Acute on chronic diastolic (congestive) heart failure: Secondary | ICD-10-CM | POA: Diagnosis not present

## 2017-10-02 DIAGNOSIS — R0609 Other forms of dyspnea: Secondary | ICD-10-CM | POA: Diagnosis not present

## 2017-10-05 ENCOUNTER — Encounter (HOSPITAL_BASED_OUTPATIENT_CLINIC_OR_DEPARTMENT_OTHER): Payer: Self-pay | Admitting: Cardiovascular Disease

## 2017-10-05 NOTE — Procedures (Signed)
Patient Name: [name], [name] Date: 09/18/2017 Gender: Female D.O.B: 01/19/1945 Age (years): 4 Referring Provider: Shelva Majestic MD, ABSM Height (inches): 62 Interpreting Physician: Shelva Majestic MD, ABSM Weight (lbs): 194 RPSGT: Earney [place] BMI: 35 MRN: [id number] Neck Size: 17.00  CLINICAL INFORMATION The patient is referred for a BiPAP titration to treat sleep apnea.  Date of NPSG, Split Night: 08/11/2017: AHI 26.6/h; AHI during REM sleep 62.4/h; oxygen desaturation to a nadir of 73%.  SLEEP STUDY TECHNIQUE As per the AASM Manual for the Scoring of Sleep and Associated Events v2.3 (April 2016) with a hypopnea requiring 4% desaturations.  The channels recorded and monitored were frontal, central and occipital EEG, electrooculogram (EOG), submentalis EMG (chin), nasal and oral airflow, thoracic and abdominal wall motion, anterior tibialis EMG, snore microphone, electrocardiogram, and pulse oximetry. Bilevel positive airway pressure (BPAP) was initiated at the beginning of the study and titrated to treat sleep-disordered breathing.  MEDICATIONS     amiodarone (PACERONE) 200 MG tablet         apixaban (ELIQUIS) 5 MG TABS tablet         Cholecalciferol (VITAMIN D-3) 1000 units CAPS         fenofibrate 160 MG tablet         insulin aspart protamine- aspart (NOVOLOG MIX 70/30) (70-30) 100 UNIT/ML injection         isosorbide mononitrate (IMDUR) 30 MG 24 hr tablet         levothyroxine (SYNTHROID, LEVOTHROID) 50 MCG tablet         metoprolol tartrate (LOPRESSOR) 50 MG tablet         nitroGLYCERIN (NITROSTAT) 0.4 MG SL tablet         nystatin cream (MYCOSTATIN)         olmesartan-hydrochlorothiazide (BENICAR HCT) 40-25 MG tablet         omega-3 acid ethyl esters (LOVAZA) 1 g capsule         oxybutynin (DITROPAN-XL) 5 MG 24 hr tablet         rOPINIRole (REQUIP) 0.5 MG tablet         simvastatin (ZOCOR) 20 MG tablet         torsemide (DEMADEX) 20 MG tablet      triamcinolone ointment (KENALOG) 0.5 %      Medications self-administered by patient taken the night of the study : N/A  RESPIRATORY PARAMETERS Optimal IPAP Pressure (cm): 14 AHI at Optimal Pressure (/hr) 0.0 Optimal EPAP Pressure (cm): 10   Overall Minimal O2 (%): 82.00 Minimal O2 at Optimal Pressure (%): 90.0  SLEEP ARCHITECTURE Start Time: 10:44:31 PM Stop Time: 4:45:32 AM Total Time (min): 361 Total Sleep Time (min): 289.5 Sleep Latency (min): 5.4 Sleep Efficiency (%): 80.2 REM Latency (min): 34.5 WASO (min): 66.2 Stage N1 (%): 1.21 Stage N2 (%): 59.76 Stage N3 (%): 0.00 Stage R (%): 39 Supine (%): 0.00 Arousal Index (/hr): 9.7   CARDIAC DATA The 2 lead EKG demonstrated atrial fibrillation. The mean heart rate was 81.32 beats per minute. Other EKG findings include: None.  LEG MOVEMENT DATA The total Periodic Limb Movements of Sleep (PLMS) were 156. The PLMS index was 32.33. A PLMS index of <15 is considered normal in adults.  IMPRESSIONS - BiPAP was initiated at 9/5 and was titrated up to 14/10 centimeters of water.  AHI at 14/10  was 0 0.  - Central sleep apnea was not noted during this titration (CAI = 0.2/h). - Moderete oxygen desaturations to a  nadir of 82%. - The patient snored with soft snoring volume. - No cardiac abnormalities were observed during this study. - Moderate periodic limb movements were observed during this study. Arousals associated with PLMs were rare.  DIAGNOSIS - Obstructive Sleep Apnea (327.23 [G47.33 ICD-10])  RECOMMENDATIONS - Recommend an initial trial of BiPAP therapy at 14/10 cm H2O with heated humidification. A Small size Fisher&Paykel Full Face Mask Simplus mask was used for the titration. - Efforts should be made to optimize nasal and oropharyngeal patency. - Avoid alcohol, sedatives and other CNS depressants that may worsen sleep apnea and disrupt normal sleep architecture. - Sleep hygiene should be reviewed to assess factors that may improve  sleep quality. - Weight management and regular exercise should be initiated or continued. - Recommend a download be obtained in [place] and sleep clinic evaluation after 4 weeks of therapy  [Electronically signed] 10/05/2017 02:29 PM  Shelva Majestic MD, [facility], ABSM Diplomate, American Board of Sleep Medicine   NPI: [id number]  [place] PH: [phone]   FX: [phone] [place]

## 2017-10-06 ENCOUNTER — Telehealth: Payer: Self-pay | Admitting: *Deleted

## 2017-10-06 NOTE — Telephone Encounter (Signed)
Patient notified Dr Claiborne Billings completed sleep study. Referral for BIPAP will be sent to [place].

## 2017-10-06 NOTE — Telephone Encounter (Signed)
-----   Message from Troy Sine, MD sent at 10/05/2017  2:33 PM EDT ----- Mariann Laster, please notify pt of results and have DME set up for BiPAP

## 2017-10-09 DIAGNOSIS — R002 Palpitations: Secondary | ICD-10-CM | POA: Diagnosis not present

## 2017-10-09 DIAGNOSIS — I471 Supraventricular tachycardia: Secondary | ICD-10-CM | POA: Diagnosis not present

## 2017-10-09 DIAGNOSIS — I5030 Unspecified diastolic (congestive) heart failure: Secondary | ICD-10-CM | POA: Diagnosis not present

## 2017-10-09 DIAGNOSIS — Z45018 Encounter for adjustment and management of other part of cardiac pacemaker: Secondary | ICD-10-CM | POA: Diagnosis not present

## 2017-10-09 DIAGNOSIS — Z950 Presence of cardiac pacemaker: Secondary | ICD-10-CM | POA: Diagnosis not present

## 2017-10-09 DIAGNOSIS — I2510 Atherosclerotic heart disease of native coronary artery without angina pectoris: Secondary | ICD-10-CM | POA: Diagnosis not present

## 2017-10-19 ENCOUNTER — Other Ambulatory Visit (HOSPITAL_COMMUNITY): Payer: Medicare HMO

## 2017-10-19 DIAGNOSIS — G4733 Obstructive sleep apnea (adult) (pediatric): Secondary | ICD-10-CM | POA: Diagnosis not present

## 2017-10-21 ENCOUNTER — Other Ambulatory Visit (HOSPITAL_COMMUNITY): Payer: Medicare HMO

## 2017-10-24 ENCOUNTER — Emergency Department (HOSPITAL_COMMUNITY)
Admission: EM | Admit: 2017-10-24 | Discharge: 2017-10-24 | Disposition: A | Discharge status: Home / Self Care | Payer: Medicare HMO | Attending: Emergency Medicine | Admitting: Emergency Medicine

## 2017-10-24 ENCOUNTER — Encounter (HOSPITAL_COMMUNITY): Payer: Self-pay | Admitting: Emergency Medicine

## 2017-10-24 ENCOUNTER — Emergency Department (HOSPITAL_COMMUNITY): Payer: Medicare HMO

## 2017-10-24 DIAGNOSIS — E1140 Type 2 diabetes mellitus with diabetic neuropathy, unspecified: Secondary | ICD-10-CM | POA: Diagnosis not present

## 2017-10-24 DIAGNOSIS — R0602 Shortness of breath: Secondary | ICD-10-CM

## 2017-10-24 DIAGNOSIS — Z794 Long term (current) use of insulin: Secondary | ICD-10-CM | POA: Insufficient documentation

## 2017-10-24 DIAGNOSIS — I5033 Acute on chronic diastolic (congestive) heart failure: Secondary | ICD-10-CM | POA: Diagnosis not present

## 2017-10-24 DIAGNOSIS — I2510 Atherosclerotic heart disease of native coronary artery without angina pectoris: Secondary | ICD-10-CM | POA: Diagnosis not present

## 2017-10-24 DIAGNOSIS — J189 Pneumonia, unspecified organism: Secondary | ICD-10-CM | POA: Diagnosis not present

## 2017-10-24 DIAGNOSIS — R059 Cough, unspecified: Secondary | ICD-10-CM

## 2017-10-24 DIAGNOSIS — R05 Cough: Secondary | ICD-10-CM

## 2017-10-24 DIAGNOSIS — Z950 Presence of cardiac pacemaker: Secondary | ICD-10-CM | POA: Insufficient documentation

## 2017-10-24 DIAGNOSIS — Z79899 Other long term (current) drug therapy: Secondary | ICD-10-CM | POA: Insufficient documentation

## 2017-10-24 DIAGNOSIS — I110 Hypertensive heart disease with heart failure: Secondary | ICD-10-CM | POA: Insufficient documentation

## 2017-10-24 DIAGNOSIS — R079 Chest pain, unspecified: Secondary | ICD-10-CM | POA: Diagnosis not present

## 2017-10-24 LAB — I-STAT TROPONIN, ED: Troponin i, poc: 0.04 ng/mL (ref 0.00–0.08)

## 2017-10-24 LAB — CBC
HCT: 38.6 % (ref 36.0–46.0)
Hemoglobin: 12.1 g/dL (ref 12.0–15.0)
MCH: 29.6 pg (ref 26.0–34.0)
MCHC: 31.3 g/dL (ref 30.0–36.0)
MCV: 94.4 fL (ref 80.0–100.0)
PLATELETS: 128 10*3/uL — AB (ref 150–400)
RBC: 4.09 MIL/uL (ref 3.87–5.11)
RDW: 14.2 % (ref 11.5–15.5)
WBC: 8.2 10*3/uL (ref 4.0–10.5)
nRBC: 0 % (ref 0.0–0.2)

## 2017-10-24 LAB — BASIC METABOLIC PANEL
Anion gap: 10 (ref 5–15)
BUN: 16 mg/dL (ref 8–23)
CALCIUM: 9.6 mg/dL (ref 8.9–10.3)
CO2: 27 mmol/L (ref 22–32)
Chloride: 98 mmol/L (ref 98–111)
Creatinine, Ser: 1.37 mg/dL — ABNORMAL HIGH (ref 0.44–1.00)
GFR calc Af Amer: 43 mL/min — ABNORMAL LOW (ref >60)
GFR, EST NON AFRICAN AMERICAN: 37 mL/min — AB (ref >60)
GLUCOSE: 158 mg/dL — AB (ref 70–99)
Potassium: 4.1 mmol/L (ref 3.5–5.1)
Sodium: 135 mmol/L (ref 135–145)

## 2017-10-24 LAB — BRAIN NATRIURETIC PEPTIDE: B Natriuretic Peptide: 488.4 pg/mL — ABNORMAL HIGH (ref 0.0–100.0)

## 2017-10-24 MED ORDER — LEVOFLOXACIN 750 MG PO TABS: 750.0000 mg | ORAL_TABLET | Freq: Every day | ORAL | 0 refills | Status: AC

## 2017-10-24 NOTE — ED Notes (Signed)
Patient ambulated in hallway on 4L (home O2) sats maintained at 97%. Walking back to the room after 50 ft patient needed to stop for a second "to catch her breath".  PA made aware.

## 2017-10-24 NOTE — ED Provider Notes (Signed)
[facility]ERGENCY DEPARTMENT Provider Note   CSN: [id number] Arrival date & time: 10/24/17  1814     History   Chief Complaint Chief Complaint  Patient presents with  . Shortness of Breath    HPI [name] is a 72 y.o. female.  72 y.o female with a PMH of CHF,HTN,DM presents to the ED with a chief complaint of cough and chest congestion x3 days.  Patient reports she started experiencing a productive cough along with some chest congestion, she called her cardiologist on Thursday which told her to take a water pill although there was no swelling in her legs.  Also reports some ear rhinorrhea.  Patient is on O2 at home at 4 L due to her vision saturation being low while she was in the hospital and when she sleeps.  Reports she did not take any medication for her symptom relief as she is currently taking amiodarone and Eliquis and was unsure what medication she could mix with it.  She reports the cough and congestion are worse at night and especially when she is laying flat.  She reports no relieving symptoms with any positions.  She denies any fever, chest pain, abdominal pain or urinary symptoms.     Past Medical History:  Diagnosis Date  . Arthritis   . CHF (congestive heart failure) ([place])   . Diabetes mellitus without complication ([place])   . Dyspnea   . History of chickenpox   . Hx of psoriatic arthritis   . Hyperlipemia   . Hypertension   . Mitral valve disorders(424.0)   . Obstructive sleep apnea   . Pacemaker   . Peripheral neuropathy   . Renal disorder Kidney disease stage2  . Thyroid disease hypothyroid  . Urine incontinence   . Vitamin D deficiency     Patient Active Problem List   Diagnosis Date Noted  . Dyspnea on exertion 08/28/2017  . Acute on chronic diastolic CHF (congestive heart failure) ([place]) 06/08/2017  . Acute exacerbation of CHF (congestive heart failure) ([place]) 06/07/2017  . History of UTI 12/24/2016  . Lichen planus  atrophicus 12/16/2016  . Vulvovaginal candidiasis 12/16/2016  . Itching in the vaginal area 10/11/2016  . Pruritus 10/11/2016  . Atrial fibrillation ([place]) 10/11/2016  . Pseudophakia 03/11/2016  . Tachycardia-bradycardia syndrome ([place]) 04/29/2015  . Complex sleep apnea syndrome 04/29/2014  . CAD (coronary artery disease) 04/29/2014  . Moderate mitral regurgitation 04/29/2014  . Diabetes mellitus without complication ([place]) 16/07/3708  . Chronic diastolic heart failure ([place]) 05/30/2012  . OBESITY 08/02/2008  . DIVERTICULOSIS OF COLON 08/02/2008    Past Surgical History:  Procedure Laterality Date  . CARDIAC CATHETERIZATION N/A 04/29/2015   Procedure: Temporary Pacemaker;  Surgeon: Adrian Prows, MD;  Location: [facility];  Service: Cardiovascular;  Laterality: N/A;  . EP IMPLANTABLE DEVICE N/A 04/30/2015   Procedure: Pacemaker Implant;  Surgeon: Evans Lance, MD;  Location: [facility];  Service: Cardiovascular;  Laterality: N/A;  . INTRAVASCULAR PRESSURE WIRE/FFR STUDY N/A 08/31/2017   Procedure: INTRAVASCULAR PRESSURE WIRE/FFR STUDY;  Surgeon: Nigel Mormon, MD;  Location: [facility];  Service: Cardiovascular;  Laterality: N/A;  . LEFT AND RIGHT HEART CATHETERIZATION WITH CORONARY ANGIOGRAM N/A 09/30/2011   Procedure: LEFT AND RIGHT HEART CATHETERIZATION WITH CORONARY ANGIOGRAM;  Surgeon: Laverda Page, MD;  Location: [facility];  Service: Cardiovascular;  Laterality: N/A;  . RIGHT/LEFT HEART CATH AND CORONARY ANGIOGRAPHY N/A 08/31/2017   Procedure: RIGHT/LEFT  HEART CATH AND CORONARY ANGIOGRAPHY;  Surgeon: Nigel Mormon, MD;  Location: [facility];  Service: Cardiovascular;  Laterality: N/A;  . YAG LASER APPLICATION Right 0/94/7096   Procedure: YAG LASER APPLICATION;  Surgeon: Elta Guadeloupe T. Gershon Crane, MD;  Location: [facility];  Service: Ophthalmology;  Laterality: Right;  pt knows to arrive at 11:15  . YAG LASER APPLICATION Left 02/21/3660   Procedure: YAG  LASER APPLICATION;  Surgeon: Rutherford Guys, MD;  Location: [facility];  Service: Ophthalmology;  Laterality: Left;     OB History    Gravida  3   Para  3   Term      Preterm      AB      Living  3     SAB      TAB      Ectopic      Multiple      Live Births               Home Medications    Prior to Admission medications   Medication Sig Start Date End Date Taking? Authorizing Provider  amiodarone (PACERONE) 200 MG tablet Take 1 tablet (200 mg total) by mouth 2 (two) times daily. 09/01/17   Patwardhan, Reynold Bowen, MD  apixaban (ELIQUIS) 5 MG TABS tablet Take 1 tablet (5 mg total) by mouth 2 (two) times daily. Patient taking differently: Take 5 mg by mouth See admin instructions. Take 5 mg by mouth two times a day- before breakfast and before supper 05/02/15   Rai, Ripudeep K, MD  Cholecalciferol (VITAMIN D-3) 1000 units CAPS Take 2,000 Units by mouth daily.    [provider]  fenofibrate 160 MG tablet Take 160 mg by mouth daily with supper.     [provider]  insulin aspart protamine- aspart (NOVOLOG MIX 70/30) (70-30) 100 UNIT/ML injection Inject 46-76 Units into the skin See admin instructions. Inject 76 units into the skin with breakfast and 46 units with supper    [provider]  isosorbide mononitrate (IMDUR) 30 MG 24 hr tablet Take 1 tablet (30 mg total) by mouth at bedtime. 10/01/15   Dixie Dials, MD  levofloxacin (LEVAQUIN) 750 MG tablet Take 1 tablet (750 mg total) by mouth daily for 5 days. 10/24/17 10/29/17  Janeece Fitting, PA-C  levothyroxine (SYNTHROID, LEVOTHROID) 50 MCG tablet Take 50 mcg by mouth daily before breakfast.    [provider]  metoprolol tartrate (LOPRESSOR) 50 MG tablet Take 1 tablet (50 mg total) by mouth 2 (two) times daily. Patient taking differently: Take 50 mg by mouth See admin instructions. Take 50 mg by mouth two times a day- before breakfast and before supper 10/01/15   Dixie Dials, MD  nitroGLYCERIN  (NITROSTAT) 0.4 MG SL tablet Place 1 tablet (0.4 mg total) under the tongue every 5 (five) minutes as needed for chest pain. 10/01/15   Dixie Dials, MD  nystatin cream (MYCOSTATIN) Apply 1 application topically 2 (two) times daily. Patient taking differently: Apply 1 application topically 2 (two) times daily as needed (for irritation of affected area).  01/12/17   Estill Dooms, NP  olmesartan-hydrochlorothiazide (BENICAR HCT) 40-25 MG tablet Take 1 tablet by mouth daily.    [provider]  omega-3 acid ethyl esters (LOVAZA) 1 g capsule Take 2 capsules 2 (two) times daily by mouth. 11/16/16   [provider]  oxybutynin (DITROPAN-XL) 5 MG 24 hr tablet Take 1 tablet (5 mg total) by mouth at bedtime. 07/24/17  Derrek Monaco A, NP  rOPINIRole (REQUIP) 0.5 MG tablet Take 0.5 mg by mouth See admin instructions. Take 0.5 mg by mouth 1-3 hours before bed    [provider]  simvastatin (ZOCOR) 20 MG tablet Take 20 mg by mouth daily after supper.    [provider]  torsemide (DEMADEX) 20 MG tablet Take 1 tablet (20 mg total) by mouth as needed. As needed for leg edema 09/01/17   Patwardhan, Reynold Bowen, MD  triamcinolone ointment (KENALOG) 0.5 % Apply 1 application topically 2 (two) times daily. Patient taking differently: Apply 1 application topically 2 (two) times daily as needed (for itching).  05/12/17   Estill Dooms, NP    Family History Family History  Problem Relation Age of Onset  . Arthritis Mother   . Diabetes Mother   . Heart disease Mother   . Hyperlipidemia Mother   . Hypertension Mother   . Arthritis Father   . Asthma Father   . Heart attack Father   . Hyperlipidemia Father   . Hypertension Father   . Stroke Father   . Arthritis Sister   . Diabetes Sister   . Hypertension Sister   . Arthritis Brother   . Heart attack Brother   . Heart disease Brother   . Hyperlipidemia Brother   . Hypertension Brother   . Alcohol abuse Brother    . Arthritis Brother   . Diabetes Brother   . Early death Brother   . Heart disease Brother   . Hyperlipidemia Brother   . Hypertension Brother   . Arthritis Sister   . Cancer Sister   . Diabetes Sister   . Hyperlipidemia Sister   . Hypertension Sister   . Stroke Sister   . Cancer Sister   . Hyperlipidemia Sister   . Hypertension Sister   . Hypertension Daughter     Social History Social History   Tobacco Use  . Smoking status: Never Smoker  . Smokeless tobacco: Never Used  Substance Use Topics  . Alcohol use: No    Alcohol/week: 0.0 standard drinks  . Drug use: No     Allergies   Hydrocodone; Invokana [canagliflozin]; and Oxycodone   Review of Systems Review of Systems  Constitutional: Negative for fever.  HENT: Positive for rhinorrhea. Negative for sinus pressure, sinus pain and sore throat.   Respiratory: Positive for cough, chest tightness and shortness of breath. Negative for wheezing.   Cardiovascular: Negative for chest pain.  Gastrointestinal: Negative for abdominal pain, diarrhea, nausea and vomiting.  Genitourinary: Positive for dysuria. Negative for flank pain.  Musculoskeletal: Negative for back pain.  Skin: Negative for pallor and wound.  Neurological: Negative for light-headedness and headaches.  All other systems reviewed and are negative.    Physical Exam Updated Vital Signs BP (!) 156/62   Pulse 60   Temp 98.7 F (37.1 C) (Oral)   Resp (!) 25   SpO2 97%   Physical Exam  Constitutional: She is oriented to person, place, and time. She appears well-developed and well-nourished.  HENT:  Head: Normocephalic and atraumatic.  Neck: Normal range of motion. Neck supple.  Cardiovascular: Normal heart sounds.  Pulmonary/Chest: Effort normal. She has decreased breath sounds. She has no wheezes. She has no rhonchi. She has no rales.  Abdominal: Bowel sounds are normal. She exhibits distension. There is no tenderness.  Musculoskeletal:        Right lower leg: She exhibits no tenderness and no edema.  Left lower leg: She exhibits no tenderness and no edema.  Neurological: She is alert and oriented to person, place, and time.  Skin: Skin is warm and dry.  Nursing note and vitals reviewed.    ED Treatments / Results  Labs (all labs ordered are listed, but only abnormal results are displayed) Labs Reviewed  BASIC METABOLIC PANEL - Abnormal; Notable for the following components:      Result Value   Glucose, Bld 158 (*)    Creatinine, Ser 1.37 (*)    GFR calc non Af Amer 37 (*)    GFR calc Af Amer 43 (*)    All other components within normal limits  CBC - Abnormal; Notable for the following components:   Platelets 128 (*)    All other components within normal limits  BRAIN NATRIURETIC PEPTIDE - Abnormal; Notable for the following components:   B Natriuretic Peptide 488.4 (*)    All other components within normal limits  I-STAT TROPONIN, ED    EKG None  Radiology Dg Chest 2 View  Result Date: 10/24/2017 CLINICAL DATA:  Fever, chills, mild RIGHT-sided chest pain. EXAM: CHEST - 2 VIEW COMPARISON:  Chest x-ray dated 06/07/2017. FINDINGS: Mild cardiomegaly is stable. Overall cardiomediastinal silhouette is stable. Chronic mild central pulmonary vascular congestion. Lungs are otherwise clear. No pleural effusion or pneumothorax seen. LEFT chest wall pacemaker/ICD apparatus appears stable in position. No acute or suspicious osseous finding. Stable/chronic elevation of the RIGHT hemidiaphragm. IMPRESSION: Cardiomegaly with chronic mild central pulmonary vascular congestion, suggesting mild chronic CHF. No evidence of overt alveolar pulmonary edema. No evidence of pneumonia. Electronically Signed   By: Franki Cabot M.D.   On: 10/24/2017 19:28    Procedures Procedures (including critical care time)  Medications Ordered in ED Medications - No data to display   Initial Impression / Assessment and Plan / ED Course  I have  reviewed the triage vital signs and the nursing notes.  Pertinent labs & imaging results that were available during my care of the patient were reviewed by me and considered in my medical decision making (see chart for details).    She presents with shortness of breath along with cough which has been going on for 3 days.  Called her cardiologist who advised her to take another fluid pill to relieve her symptoms.  She denies any calf tenderness, swelling in her legs.  BC showed no leukocytosis, BMP shows creatinine at 1.37 slightly increased from previous times but around patient's baseline.  BNP is 488.4.  Troponin was negative.DG chest 2 view showed no consolidation at this time but based on the patient's symptoms and her risk of getting worse will place her on antibiotics to help with symptoms. Patient's EKG showed no STEMI or changes consistent with infarct. Patient was ambulated by RN Katie and was able to keep her oxygen saturation at 97% however she felt she needed to stop when returning to the room to catch her breath. I have had a shared decision making conversation with patient, and patient would like to be treated with outpatient antibiotics. I have advised patient she will need to follow up with her PCP after completing treatment. She is advised to return to the ED if her symptoms worsen or you experiences a fever or worsening shortness of breath.Family at he bedside reports understanding as well. Patient reports she has her Cpap family at home which helps her with relieve. Return precautions discussed at length.   Final Clinical Impressions(s) / ED  Diagnoses   Final diagnoses:  Community acquired pneumonia, unspecified laterality  SOB (shortness of breath)  Cough    ED Discharge Orders         Ordered    levofloxacin (LEVAQUIN) 750 MG tablet  Daily     10/24/17 2108           Janeece Fitting, PA-C 10/24/17 2114    Jola Schmidt, MD 10/25/17 1755

## 2017-10-24 NOTE — Discharge Instructions (Signed)
I have given you a prescription for antibiotics in order to treat possible pneumonia.  If you experience any worsening symptoms, worsening shortness of breath, severe cough, chest pain please return to the ED for reevaluation.  Please schedule an appointment with your primary care physician after completing antibiotic therapy in order to obtain a chest x-ray to evaluate resolution of your symptoms.

## 2017-10-24 NOTE — ED Notes (Signed)
Patient verbalizes understanding of medications and discharge instructions. No further questions at this time. VSS and patient ambulatory at discharge.   

## 2017-10-24 NOTE — ED Triage Notes (Signed)
Pt presents to ED for assessment for SOB since Wednesday, with fever, chills, and relieved with cpap at night to sleep.  Pt wears O2 at night, but has been wearing it around the clock since Wednesday.  Began with a mild right sided chest pain Wednesday (not happening now) and was told to take an extra fluid pill which seemed to help at first.

## 2017-11-01 DIAGNOSIS — I5033 Acute on chronic diastolic (congestive) heart failure: Secondary | ICD-10-CM | POA: Diagnosis not present

## 2017-11-01 DIAGNOSIS — R0600 Dyspnea, unspecified: Secondary | ICD-10-CM | POA: Diagnosis not present

## 2017-11-04 DIAGNOSIS — Z794 Long term (current) use of insulin: Secondary | ICD-10-CM | POA: Diagnosis not present

## 2017-11-04 DIAGNOSIS — E119 Type 2 diabetes mellitus without complications: Secondary | ICD-10-CM | POA: Diagnosis not present

## 2017-11-09 DIAGNOSIS — E1122 Type 2 diabetes mellitus with diabetic chronic kidney disease: Secondary | ICD-10-CM | POA: Diagnosis not present

## 2017-11-09 DIAGNOSIS — I4891 Unspecified atrial fibrillation: Secondary | ICD-10-CM | POA: Diagnosis not present

## 2017-11-19 DIAGNOSIS — G4733 Obstructive sleep apnea (adult) (pediatric): Secondary | ICD-10-CM | POA: Diagnosis not present

## 2017-11-20 DIAGNOSIS — Z950 Presence of cardiac pacemaker: Secondary | ICD-10-CM | POA: Diagnosis not present

## 2017-11-20 DIAGNOSIS — I10 Essential (primary) hypertension: Secondary | ICD-10-CM | POA: Diagnosis not present

## 2017-11-20 DIAGNOSIS — I5030 Unspecified diastolic (congestive) heart failure: Secondary | ICD-10-CM | POA: Diagnosis not present

## 2017-11-20 DIAGNOSIS — I471 Supraventricular tachycardia: Secondary | ICD-10-CM | POA: Diagnosis not present

## 2017-11-20 DIAGNOSIS — Z45018 Encounter for adjustment and management of other part of cardiac pacemaker: Secondary | ICD-10-CM | POA: Diagnosis not present

## 2017-11-30 ENCOUNTER — Ambulatory Visit: Payer: Medicare HMO | Admitting: Cardiovascular Disease

## 2017-11-30 VITALS — BP 155/74 | HR 84 | Resp 16 | Ht 62.0 in | Wt 190.0 lb

## 2017-11-30 DIAGNOSIS — E668 Other obesity: Secondary | ICD-10-CM

## 2017-11-30 DIAGNOSIS — E119 Type 2 diabetes mellitus without complications: Secondary | ICD-10-CM | POA: Diagnosis not present

## 2017-11-30 DIAGNOSIS — Z7901 Long term (current) use of anticoagulants: Secondary | ICD-10-CM

## 2017-11-30 DIAGNOSIS — I480 Paroxysmal atrial fibrillation: Secondary | ICD-10-CM

## 2017-11-30 DIAGNOSIS — G4731 Primary central sleep apnea: Secondary | ICD-10-CM

## 2017-11-30 DIAGNOSIS — Z794 Long term (current) use of insulin: Secondary | ICD-10-CM | POA: Diagnosis not present

## 2017-11-30 DIAGNOSIS — IMO0001 Reserved for inherently not codable concepts without codable children: Secondary | ICD-10-CM

## 2017-11-30 NOTE — Patient Instructions (Signed)
Follow-Up: At [facility], you and your health needs are our priority.  As part of our continuing mission to provide you with exceptional heart care, we have created designated Provider Care Teams.  These Care Teams include your primary Cardiologist (physician) and Advanced Practice Providers (APPs -  Physician Assistants and Nurse Practitioners) who all work together to provide you with the care you need, when you need it. . 1 year with Dr. Kelly (sleep clinic)    

## 2017-12-01 NOTE — Progress Notes (Signed)
Patient ID: [name], female   DOB: [birth year], 72 y.o.   MRN: [id number]    Primary MD: Dr. Deland Pretty  Primary cardiologist: Dr. Einar Gip  HPI: [name] is a 72 y.o. female who was referred by Dr.Pharr for a sleep evaluation.  I initially saw her in July 2019.  She presents for 99-monthfollow-up evaluation.  [name] was diagnosed with sleep apnea in February 2013 when she was referred for sleep study for evaluation of nonrestorative sleep and loud snoring.  At that time she had witnessed apnea, was gasping for breath and her symptoms had persisted for over 2 years.  She was found to have moderate obstructive sleep apnea.  Overall, with an HIV 18.5 per hour.  However, with rim sleep.  Events were severe, with an HI 55.7 per hour.  She had significant oxygen desaturation to 70% with non-REM sleep and 64% with rim sleep.  She had moderate snoring.  She underwent a CPAP titration and with CPAP therapy due to development of central events, and the need for higher pressures.  She ultimately required BiPAP therapy.  [facility]  She has not been able to get any supplies.  She has noticed a significant "whistle "her mask  I saw her for sleep evaluation in April 2016 and at that time was able to obtain a download of her unit from 03/28/2014 through 04/26/2014.  She was meeting Medicare compliance with use of 100%.  She is averaging 7 hours and 22 minutes per day and is currently set at an IPAP pressure of 19 and an EPAP pressure of 15.  Her AHI is very good at 4.0.  She set at a ramp time of 30 minutes.  She has a nasal mask, F&P Zest nasal mask.  She typically goes to bed at a 11:30 p.m. and wakes up at 6 AM for a sleep duration of only 6-1/2 hours.  She continues to have daytime sleepiness and an Epworth Sleepiness Scale score was elevated and endorsed at 15.  When I saw her in July 2019 after not  having seen her since 2016  her BiPAP machine quit and was non functional for over 2 years.  As result, she has not been sleeping well.  She went to bed at midnight and woke up between 7 and 7:30 in the morning.  She usually has 2-3 episodes of nocturia.  She does snore.  She had recently seen Dr. WDeland Prettywho recommended reassessment of her sleep apnea she presents for evaluation.  Since I saw her, she underwent a reevaluation on August 11, 2017.  AHI was 26.6, AHI during rem sleep was significantly elevated at 62.4/h and there was significant oxygen desaturation to a nadir of 73%.  Ultimately required BiPAP titration and underwent a BiPAP titration study on December 18, 2017 and 14/10 centimeters water pressure was recommended.  Since initiating BiPAP therapy, she has noticed significant improvement and feels much better.  She is sleeping well.  She has more energy.  Her set up date was October 19, 2017.  [employer] is her DME company.  A download was obtained from October 25, 2017 through November 23, 2017 which shows 100% compliance.  She is sleeping 8 hours and 43 minutes.  AHI is excellent at 0.5 on her current settings.  She presents for reevaluation.   Past Medical History:  Diagnosis Date  .  Arthritis   . CHF (congestive heart failure) ([place])   . Diabetes mellitus without complication ([place])   . Dyspnea   . History of chickenpox   . Hx of psoriatic arthritis   . Hyperlipemia   . Hypertension   . Mitral valve disorders(424.0)   . Obstructive sleep apnea   . Pacemaker   . Peripheral neuropathy   . Renal disorder Kidney disease stage2  . Thyroid disease hypothyroid  . Urine incontinence   . Vitamin D deficiency     Past Surgical History:  Procedure Laterality Date  . CARDIAC CATHETERIZATION N/A 04/29/2015   Procedure: Temporary Pacemaker;  Surgeon: Adrian Prows, MD;  Location: [place] [facility];  Service: Cardiovascular;  Laterality: N/A;  . EP IMPLANTABLE DEVICE N/A 04/30/2015    Procedure: Pacemaker Implant;  Surgeon: Evans Lance, MD;  Location: [place] [facility];  Service: Cardiovascular;  Laterality: N/A;  . INTRAVASCULAR PRESSURE WIRE/FFR STUDY N/A 08/31/2017   Procedure: INTRAVASCULAR PRESSURE WIRE/FFR STUDY;  Surgeon: Nigel Mormon, MD;  Location: [place] [facility];  Service: Cardiovascular;  Laterality: N/A;  . LEFT AND RIGHT HEART CATHETERIZATION WITH CORONARY ANGIOGRAM N/A 09/30/2011   Procedure: LEFT AND RIGHT HEART CATHETERIZATION WITH CORONARY ANGIOGRAM;  Surgeon: Laverda Page, MD;  Location: [facility];  Service: Cardiovascular;  Laterality: N/A;  . RIGHT/LEFT HEART CATH AND CORONARY ANGIOGRAPHY N/A 08/31/2017   Procedure: RIGHT/LEFT HEART CATH AND CORONARY ANGIOGRAPHY;  Surgeon: Nigel Mormon, MD;  Location: [place] [facility];  Service: Cardiovascular;  Laterality: N/A;  . YAG LASER APPLICATION Right 9/56/3875   Procedure: YAG LASER APPLICATION;  Surgeon: Elta Guadeloupe T. Gershon Crane, MD;  Location: [facility];  Service: Ophthalmology;  Laterality: Right;  pt knows to arrive at 11:15  . YAG LASER APPLICATION Left 06/17/3327   Procedure: YAG LASER APPLICATION;  Surgeon: Rutherford Guys, MD;  Location: [facility];  Service: Ophthalmology;  Laterality: Left;    Allergies  Allergen Reactions  . Hydrocodone Other (See Comments)    Lethargic   . Invokana [Canagliflozin] Palpitations and Other (See Comments)    Made heart race  . Oxycodone Other (See Comments)    Lethargic     Current Outpatient Medications  Medication Sig Dispense Refill  . amiodarone (PACERONE) 200 MG tablet Take 1 tablet (200 mg total) by mouth 2 (two) times daily. 60 tablet 3  . apixaban (ELIQUIS) 5 MG TABS tablet Take 1 tablet (5 mg total) by mouth 2 (two) times daily. (Patient taking differently: Take 5 mg by mouth See admin instructions. Take 5 mg by mouth two times a day- before breakfast and before supper) 60 tablet 3  . Cholecalciferol (VITAMIN D-3) 1000 units CAPS Take 2,000 Units  by mouth daily.    . fenofibrate 160 MG tablet Take 160 mg by mouth daily with supper.     . insulin aspart protamine- aspart (NOVOLOG MIX 70/30) (70-30) 100 UNIT/ML injection Inject 20-36 Units into the skin See admin instructions. Inject 36 units into the skin with breakfast and 20 units with supper    . isosorbide mononitrate (IMDUR) 30 MG 24 hr tablet Take 1 tablet (30 mg total) by mouth at bedtime.    Marland Kitchen levothyroxine (SYNTHROID, LEVOTHROID) 50 MCG tablet Take 50 mcg by mouth daily before breakfast.    . metFORMIN (GLUCOPHAGE) 500 MG tablet Take 1,000 mg by mouth 2 (two) times daily.  3  . metoprolol tartrate (LOPRESSOR) 50 MG tablet Take 1 tablet (50 mg total) by mouth  2 (two) times daily. (Patient taking differently: Take 50 mg by mouth See admin instructions. Take 50 mg by mouth two times a day- before breakfast and before supper) 60 tablet 3  . nitroGLYCERIN (NITROSTAT) 0.4 MG SL tablet Place 1 tablet (0.4 mg total) under the tongue every 5 (five) minutes as needed for chest pain. 25 tablet 1  . nystatin cream (MYCOSTATIN) Apply 1 application topically 2 (two) times daily. (Patient taking differently: Apply 1 application topically 2 (two) times daily as needed (for irritation of affected area). ) 30 g 3  . olmesartan-hydrochlorothiazide (BENICAR HCT) 40-25 MG tablet Take 1 tablet by mouth daily.    Marland Kitchen omega-3 acid ethyl esters (LOVAZA) 1 g capsule Take 2 capsules 2 (two) times daily by mouth.    . oxybutynin (DITROPAN-XL) 5 MG 24 hr tablet Take 1 tablet (5 mg total) by mouth at bedtime. 30 tablet 6  . rOPINIRole (REQUIP) 0.5 MG tablet Take 0.5 mg by mouth See admin instructions. Take 0.5 mg by mouth 1-3 hours before bed    . simvastatin (ZOCOR) 20 MG tablet Take 20 mg by mouth daily after supper.    . torsemide (DEMADEX) 20 MG tablet Take 1 tablet (20 mg total) by mouth as needed. As needed for leg edema 30 tablet 3  . triamcinolone ointment (KENALOG) 0.5 % Apply 1 application topically 2 (two)  times daily. (Patient taking differently: Apply 1 application topically 2 (two) times daily as needed (for itching). ) 30 g 1   No current facility-administered medications for this visit.     Social History   Socioeconomic History  . Marital status: Married    Spouse name: Not on file  . Number of children: Not on file  . Years of education: Not on file  . Highest education level: Not on file  Occupational History  . Not on file  Social Needs  . Financial resource strain: Not on file  . Food insecurity:    Worry: Not on file    Inability: Not on file  . Transportation needs:    Medical: Not on file    Non-medical: Not on file  Tobacco Use  . Smoking status: Never Smoker  . Smokeless tobacco: Never Used  Substance and Sexual Activity  . Alcohol use: No    Alcohol/week: 0.0 standard drinks  . Drug use: No  . Sexual activity: Not Currently  Lifestyle  . Physical activity:    Days per week: Not on file    Minutes per session: Not on file  . Stress: Not on file  Relationships  . Social connections:    Talks on phone: Not on file    Gets together: Not on file    Attends religious service: Not on file    Active member of club or organization: Not on file    Attends meetings of clubs or organizations: Not on file    Relationship status: Not on file  . Intimate partner violence:    Fear of current or ex partner: Not on file    Emotionally abused: Not on file    Physically abused: Not on file    Forced sexual activity: Not on file  Other Topics Concern  . Not on file  Social History Narrative  . Not on file   Social history is notable: She is married, has 3 children 5 grandchildren.  Family History  Problem Relation Age of Onset  . Arthritis Mother   . Diabetes Mother   .  Heart disease Mother   . Hyperlipidemia Mother   . Hypertension Mother   . Arthritis Father   . Asthma Father   . Heart attack Father   . Hyperlipidemia Father   . Hypertension Father   .  Stroke Father   . Arthritis Sister   . Diabetes Sister   . Hypertension Sister   . Arthritis Brother   . Heart attack Brother   . Heart disease Brother   . Hyperlipidemia Brother   . Hypertension Brother   . Alcohol abuse Brother   . Arthritis Brother   . Diabetes Brother   . Early death Brother   . Heart disease Brother   . Hyperlipidemia Brother   . Hypertension Brother   . Arthritis Sister   . Cancer Sister   . Diabetes Sister   . Hyperlipidemia Sister   . Hypertension Sister   . Stroke Sister   . Cancer Sister   . Hyperlipidemia Sister   . Hypertension Sister   . Hypertension Daughter    Family history is notable that her mother died at age 56 and had diabetes and heart disease.  Father died of stroke at age 9.  There are total of 6 siblings, 3 brothers and 3 sisters of which 2 brothers and 2 sisters are deceased.  One sister who is deceased, had sleep apnea but was untreated.  One brother at heart disease is deceased and had sleep apnea and was noncompliant with his CPAP use.  ROS General: Negative; No fevers, chills, or night sweats.  Positive for moderate obesity HEENT: Negative; No changes in vision or hearing, sinus congestion, difficulty swallowing Pulmonary: Negative; No cough, wheezing, shortness of breath, hemoptysis Cardiovascular: Positive for diastolic heart failure GI: Negative; No nausea, vomiting, diarrhea, or abdominal pain GU: Negative; No dysuria, hematuria, or difficulty voiding Musculoskeletal: Negative; no myalgias, joint pain, or weakness Hematologic: Negative; no easy bruising, bleeding Endocrine: Positive for diabetes mellitus Neuro: Negative; no changes in balance, headaches Skin: Negative; No rashes or skin lesions Psychiatric: Negative; No behavioral problems, depression Sleep: Positive for OSA previously with  central events with CPAP therapy; now on BiPAP.   Physical Exam BP (!) 155/74   Pulse 84   Resp 16   Ht 5' 2" (1.575 m)   Wt  190 lb (86.2 kg)   SpO2 93%   BMI 34.75 kg/m   Blood pressure by me 124/72.  Wt Readings from Last 3 Encounters:  11/30/17 190 lb (86.2 kg)  09/19/17 194 lb (88 kg)  09/01/17 194 lb 3.6 oz (88.1 kg)   General: Alert, oriented, no distress.  Skin: normal turgor, no rashes, warm and dry HEENT: Normocephalic, atraumatic. Pupils equal round and reactive to light; sclera anicteric; extraocular muscles intact;  Nose without nasal septal hypertrophy Mouth/Parynx benign; Mallinpatti scale 3 Neck: No JVD, no carotid bruits; normal carotid upstroke Lungs: clear to ausculatation and percussion; no wheezing or rales Chest wall: without tenderness to palpitation Heart: PMI not displaced, RRR, s1 s2 normal, 1/6 systolic murmur, no diastolic murmur, no rubs, gallops, thrills, or heaves Abdomen: soft, nontender; no hepatosplenomehaly, BS+; abdominal aorta nontender and not dilated by palpation. Back: no CVA tenderness Pulses 2+ Musculoskeletal: full range of motion, normal strength, no joint deformities Extremities: no clubbing cyanosis or edema, Homan's sign negative  Neurologic: grossly nonfocal; Cranial nerves grossly wnl Psychologic: Normal mood and affect   ECG (independently read by me): Atrially paced at 78 bpm with prolonged AV conduction with a PR interval  of 296 ms.  R wave progression anteriorly.  ECG from Jun 07, 2017 was independently reviewed by me which reveals atrially paced rhythm.  Poor anterior R wave progression.  LABS:  BMP Latest Ref Rng & Units 10/24/2017 09/01/2017 08/31/2017  Glucose 70 - 99 mg/dL 158(H) 114(H) 198(H)  BUN 8 - 23 mg/dL 16 29(H) 48(H)  Creatinine 0.44 - 1.00 mg/dL 1.37(H) 1.27(H) 1.44(H)  Sodium 135 - 145 mmol/L 135 135 135  Potassium 3.5 - 5.1 mmol/L 4.1 4.4 4.0  Chloride 98 - 111 mmol/L 98 101 95(L)  CO2 22 - 32 mmol/L _0 Calcium 8.9 - 10.3 mg/dL 9.6 9.2 9.8     Hepatic Function Latest Ref Rng & Units 08/28/2017 06/08/2017 10/08/2016  Total  Protein 6.5 - 8.1 g/dL 6.8 7.0 7.2  Albumin 3.5 - 5.0 g/dL 3.3(L) 3.4(L) 4.6  AST 15 - 41 U/L 21 21 33  ALT 0 - 44 U/L 23 30 34  Alk Phosphatase 38 - 126 U/L 31(L) 24(L) 29(L)  Total Bilirubin 0.3 - 1.2 mg/dL 0.9 0.7 0.4     CBC Latest Ref Rng & Units 10/24/2017 09/01/2017 08/31/2017  WBC 4.0 - 10.5 K/uL 8.2 7.9 8.2  Hemoglobin 12.0 - 15.0 g/dL 12.1 14.4 16.3(H)  Hematocrit 36.0 - 46.0 % 38.6 45.1 49.4(H)  Platelets 150 - 400 K/uL 128(L) 135(L) 164     Lipid Panel     Component Value Date/Time   CHOL 168 08/29/2017 0328   TRIG 544 (H) 08/29/2017 0328   HDL 19 (L) 08/29/2017 0328   CHOLHDL 8.8 08/29/2017 0328   VLDL UNABLE TO CALCULATE IF TRIGLYCERIDE OVER 400 mg/dL 08/29/2017 0328   LDLCALC UNABLE TO CALCULATE IF TRIGLYCERIDE OVER 400 mg/dL 08/29/2017 0328     RADIOLOGY: No results found.  IMPRESSION:  1. Complex sleep apnea syndrome: on BiPAP   2. Paroxysmal atrial fibrillation (HCC)   3. Anticoagulated   4. Moderate obesity   5. Insulin dependent diabetes mellitus ([place])      ASSESSMENT AND PLAN: [name] is a 72 year old female who has documented to have complex sleep apnea with both obstructive and central events and has been on BiPAP therapy since 2013.  She has significant cardiovascular comorbidities and has been followed by Dr. Einar Gip for her cardiac issues.  I had not seen her since April 2016 when I saw her in July 2019 her BiPAP machine had been nonfunctional over several years.  I reviewed her most recent split-night protocol with subsequent BiPAP titration.  She again was found to have moderately severe overall sleep apnea but very severe sleep apnea during rem sleep with significant oxygen desaturation to a nadir of 73%.  Since initiating BiPAP therapy at her current pressure of 14/10, she feels markedly improved.  She has more energy.  She is sleeping well.  Her download is excellent and reveals 100% compliance.  AHI is 0.5.  Her blood pressure today is  controlled on current therapy with metoprolol 50 mg twice a day, isosorbide 30 mg, olmesartan HCT 40/25 mg.  She continues to be on amiodarone and Eliquis.  He is atrially paced without recurrent arrhythmia.  She is followed by Dr. Einar Gip for her primary cardiology care.  She is diabetic on insulin and is on lipid-lowering therapy most recently with simvastatin and fenofibrate.  From a sleep perspective she is doing well.  I will see her in 1 year for reevaluation.   Time spent: 25 minutes Troy Sine, MD,  [facility]  12/02/2017 5:17 PM

## 2017-12-02 ENCOUNTER — Encounter: Payer: Self-pay | Admitting: Cardiovascular Disease

## 2017-12-02 DIAGNOSIS — I5033 Acute on chronic diastolic (congestive) heart failure: Secondary | ICD-10-CM | POA: Diagnosis not present

## 2017-12-02 DIAGNOSIS — R0600 Dyspnea, unspecified: Secondary | ICD-10-CM | POA: Diagnosis not present

## 2017-12-18 DIAGNOSIS — I129 Hypertensive chronic kidney disease with stage 1 through stage 4 chronic kidney disease, or unspecified chronic kidney disease: Secondary | ICD-10-CM | POA: Diagnosis not present

## 2017-12-18 DIAGNOSIS — E785 Hyperlipidemia, unspecified: Secondary | ICD-10-CM | POA: Diagnosis not present

## 2017-12-18 DIAGNOSIS — E559 Vitamin D deficiency, unspecified: Secondary | ICD-10-CM | POA: Diagnosis not present

## 2017-12-18 DIAGNOSIS — E039 Hypothyroidism, unspecified: Secondary | ICD-10-CM | POA: Diagnosis not present

## 2017-12-18 DIAGNOSIS — E1122 Type 2 diabetes mellitus with diabetic chronic kidney disease: Secondary | ICD-10-CM | POA: Diagnosis not present

## 2017-12-19 DIAGNOSIS — G4733 Obstructive sleep apnea (adult) (pediatric): Secondary | ICD-10-CM | POA: Diagnosis not present

## 2017-12-21 DIAGNOSIS — G4733 Obstructive sleep apnea (adult) (pediatric): Secondary | ICD-10-CM | POA: Diagnosis not present

## 2018-01-01 DIAGNOSIS — R0609 Other forms of dyspnea: Secondary | ICD-10-CM | POA: Diagnosis not present

## 2018-01-01 DIAGNOSIS — I5033 Acute on chronic diastolic (congestive) heart failure: Secondary | ICD-10-CM | POA: Diagnosis not present

## 2018-01-18 DIAGNOSIS — I10 Essential (primary) hypertension: Secondary | ICD-10-CM | POA: Diagnosis not present

## 2018-01-19 DIAGNOSIS — R69 Illness, unspecified: Secondary | ICD-10-CM | POA: Diagnosis not present

## 2018-01-19 DIAGNOSIS — G4733 Obstructive sleep apnea (adult) (pediatric): Secondary | ICD-10-CM | POA: Diagnosis not present

## 2018-01-22 DIAGNOSIS — G4733 Obstructive sleep apnea (adult) (pediatric): Secondary | ICD-10-CM | POA: Diagnosis not present

## 2018-01-29 DIAGNOSIS — I10 Essential (primary) hypertension: Secondary | ICD-10-CM | POA: Diagnosis not present

## 2018-01-29 DIAGNOSIS — Z950 Presence of cardiac pacemaker: Secondary | ICD-10-CM | POA: Diagnosis not present

## 2018-01-29 DIAGNOSIS — I5030 Unspecified diastolic (congestive) heart failure: Secondary | ICD-10-CM | POA: Diagnosis not present

## 2018-01-29 DIAGNOSIS — I471 Supraventricular tachycardia: Secondary | ICD-10-CM | POA: Diagnosis not present

## 2018-02-01 DIAGNOSIS — R0609 Other forms of dyspnea: Secondary | ICD-10-CM | POA: Diagnosis not present

## 2018-02-01 DIAGNOSIS — I5033 Acute on chronic diastolic (congestive) heart failure: Secondary | ICD-10-CM | POA: Diagnosis not present

## 2018-02-02 DIAGNOSIS — G4730 Sleep apnea, unspecified: Secondary | ICD-10-CM | POA: Diagnosis not present

## 2018-02-02 DIAGNOSIS — I4891 Unspecified atrial fibrillation: Secondary | ICD-10-CM | POA: Diagnosis not present

## 2018-02-02 DIAGNOSIS — E785 Hyperlipidemia, unspecified: Secondary | ICD-10-CM | POA: Diagnosis not present

## 2018-02-02 DIAGNOSIS — Z794 Long term (current) use of insulin: Secondary | ICD-10-CM | POA: Diagnosis not present

## 2018-02-02 DIAGNOSIS — E669 Obesity, unspecified: Secondary | ICD-10-CM | POA: Diagnosis not present

## 2018-02-02 DIAGNOSIS — E119 Type 2 diabetes mellitus without complications: Secondary | ICD-10-CM | POA: Diagnosis not present

## 2018-02-02 DIAGNOSIS — I509 Heart failure, unspecified: Secondary | ICD-10-CM | POA: Diagnosis not present

## 2018-02-02 DIAGNOSIS — I110 Hypertensive heart disease with heart failure: Secondary | ICD-10-CM | POA: Diagnosis not present

## 2018-02-02 DIAGNOSIS — I252 Old myocardial infarction: Secondary | ICD-10-CM | POA: Diagnosis not present

## 2018-02-02 DIAGNOSIS — I2510 Atherosclerotic heart disease of native coronary artery without angina pectoris: Secondary | ICD-10-CM | POA: Diagnosis not present

## 2018-02-10 DIAGNOSIS — Z794 Long term (current) use of insulin: Secondary | ICD-10-CM | POA: Diagnosis not present

## 2018-02-10 DIAGNOSIS — E119 Type 2 diabetes mellitus without complications: Secondary | ICD-10-CM | POA: Diagnosis not present

## 2018-02-19 DIAGNOSIS — G4733 Obstructive sleep apnea (adult) (pediatric): Secondary | ICD-10-CM | POA: Diagnosis not present

## 2018-02-23 DIAGNOSIS — G4733 Obstructive sleep apnea (adult) (pediatric): Secondary | ICD-10-CM | POA: Diagnosis not present

## 2018-03-01 DIAGNOSIS — Z45018 Encounter for adjustment and management of other part of cardiac pacemaker: Secondary | ICD-10-CM | POA: Diagnosis not present

## 2018-03-01 DIAGNOSIS — Z950 Presence of cardiac pacemaker: Secondary | ICD-10-CM | POA: Diagnosis not present

## 2018-03-01 DIAGNOSIS — I495 Sick sinus syndrome: Secondary | ICD-10-CM | POA: Diagnosis not present

## 2018-03-04 DIAGNOSIS — I5033 Acute on chronic diastolic (congestive) heart failure: Secondary | ICD-10-CM | POA: Diagnosis not present

## 2018-03-04 DIAGNOSIS — R0609 Other forms of dyspnea: Secondary | ICD-10-CM | POA: Diagnosis not present

## 2018-03-20 DIAGNOSIS — G4733 Obstructive sleep apnea (adult) (pediatric): Secondary | ICD-10-CM | POA: Diagnosis not present

## 2018-03-26 DIAGNOSIS — G4733 Obstructive sleep apnea (adult) (pediatric): Secondary | ICD-10-CM | POA: Diagnosis not present

## 2018-04-02 DIAGNOSIS — R069 Unspecified abnormalities of breathing: Secondary | ICD-10-CM | POA: Diagnosis not present

## 2018-04-02 DIAGNOSIS — I5033 Acute on chronic diastolic (congestive) heart failure: Secondary | ICD-10-CM | POA: Diagnosis not present

## 2018-04-20 DIAGNOSIS — G4733 Obstructive sleep apnea (adult) (pediatric): Secondary | ICD-10-CM | POA: Diagnosis not present

## 2018-04-27 DIAGNOSIS — G4733 Obstructive sleep apnea (adult) (pediatric): Secondary | ICD-10-CM | POA: Diagnosis not present

## 2018-04-30 ENCOUNTER — Ambulatory Visit: Payer: Medicare HMO | Admitting: Cardiology

## 2018-05-03 ENCOUNTER — Encounter: Payer: Self-pay | Admitting: Cardiology

## 2018-05-03 DIAGNOSIS — R069 Unspecified abnormalities of breathing: Secondary | ICD-10-CM | POA: Diagnosis not present

## 2018-05-03 DIAGNOSIS — I10 Essential (primary) hypertension: Secondary | ICD-10-CM

## 2018-05-03 DIAGNOSIS — I5033 Acute on chronic diastolic (congestive) heart failure: Secondary | ICD-10-CM | POA: Diagnosis not present

## 2018-05-03 HISTORY — DX: Essential (primary) hypertension: I10

## 2018-05-03 NOTE — Progress Notes (Signed)
Primary Physician/Referring:  Deland Pretty, MD  Patient ID: [name], female    DOB: 1945/12/22, 73 y.o.   MRN: [id number]  Chief Complaint  Patient presents with  . Tachycardia  . Follow-up    73mo   HPI: [name] is a 73y.o. female  with hypertension, type 2 diabetes mellitus, nonobstructive CAD (Cath 2019), sick sinus syndrome status post dual-chamber pacemaker 2017, hyperlipidemia, stage III chronic kidney disease, OSA, paroxysmal Afib, paroxysmal atrial tachycardia.  Patient is feeling much better since her last visit. She has had only occasional episdoe of palpitations. Overall, denies any chest pain. shortness of breath, leg edema, orthopnea, PND, presncope, syncope, TIA.   We connected  virtually, over the telephone as he was unable to connect to the medial, she has had chronic dyspnea on exertion but states that she has not had any PND or orthopnea or leg edema.  Unless she is exerting she is not having dyspnea. No PND or orthopnea.  Past Medical History:  Diagnosis Date  . Arthritis   . CHF (congestive heart failure) ([facility]   . Diabetes mellitus without complication ([facility]   . Dyspnea   . History of chickenpox   . Hx of psoriatic arthritis   . Hyperlipemia   . Hypertension   . Mitral valve disorders(424.0)   . Obstructive sleep apnea   . Pacemaker   . Peripheral neuropathy   . Renal disorder Kidney disease stage2  . Thyroid disease hypothyroid  . Urine incontinence   . Vitamin D deficiency     Past Surgical History:  Procedure Laterality Date  . CARDIAC CATHETERIZATION N/A 04/29/2015   Procedure: Temporary Pacemaker;  Surgeon: JAdrian Prows MD;  [facility];  Service: Cardiovascular;  Laterality: N/A;  . EP IMPLANTABLE DEVICE N/A 04/30/2015   Procedure: Pacemaker Implant;  Surgeon: GEvans Lance MD;  [facility];  Service: Cardiovascular;  Laterality: N/A;  . INTRAVASCULAR PRESSURE WIRE/FFR STUDY N/A 08/31/2017   Procedure: INTRAVASCULAR PRESSURE WIRE/FFR STUDY;  Surgeon: PNigel Mormon MD;  [facility];  Service: Cardiovascular;  Laterality: N/A;  . LEFT AND RIGHT HEART CATHETERIZATION WITH CORONARY ANGIOGRAM N/A 09/30/2011   Procedure: LEFT AND RIGHT HEART CATHETERIZATION WITH CORONARY ANGIOGRAM;  Surgeon: JLaverda Page MD;  [facility];  Service: Cardiovascular;  Laterality: N/A;  . RIGHT/LEFT HEART CATH AND CORONARY ANGIOGRAPHY N/A 08/31/2017   Procedure: RIGHT/LEFT HEART CATH AND CORONARY ANGIOGRAPHY;  Surgeon: PNigel Mormon MD;  [place];  Service: Cardiovascular;  Laterality: N/A;  . YAG LASER APPLICATION Right 22/58/5277  Procedure: YAG LASER APPLICATION;  Surgeon: MElta GuadeloupeT. SGershon Crane MD;  Location: [facility];  Service: Ophthalmology;  Laterality: Right;  pt knows to arrive at 11:15  . YAG LASER APPLICATION Left 38/02/4233  Procedure: YAG LASER APPLICATION;  Surgeon: MRutherford Guys MD;  Location: [facility];  Service: Ophthalmology;  Laterality: Left;    Social History   Socioeconomic History  . Marital status: Married    Spouse name: Not on file  . Number of children: 3  . Years of education: Not on file  . Highest education level: Not on file  Occupational History  . Not on file  Social Needs  . Financial resource strain: Not on file  . Food insecurity:    Worry: Not on file    Inability: Not on file  . Transportation needs:    Medical: Not  on file    Non-medical: Not on file  Tobacco Use  . Smoking status: Never Smoker  . Smokeless tobacco: Never Used  Substance and Sexual Activity  . Alcohol use: No    Alcohol/week: 0.0 standard drinks  . Drug use: No  . Sexual activity: Not Currently  Lifestyle  . Physical activity:    Days per week: Not on file    Minutes per session: Not on file  . Stress: Not on file  Relationships  . Social connections:    Talks on phone: Not on file    Gets together: Not on file    Attends  religious service: Not on file    Active member of club or organization: Not on file    Attends meetings of clubs or organizations: Not on file    Relationship status: Not on file  . Intimate partner violence:    Fear of current or ex partner: Not on file    Emotionally abused: Not on file    Physically abused: Not on file    Forced sexual activity: Not on file  Other Topics Concern  . Not on file  Social History Narrative  . Not on file    Current Outpatient Medications on File Prior to Visit  Medication Sig Dispense Refill  . amiodarone (PACERONE) 200 MG tablet Take 200 mg by mouth daily.    Marland Kitchen apixaban (ELIQUIS) 5 MG TABS tablet Take 1 tablet (5 mg total) by mouth 2 (two) times daily. (Patient taking differently: Take 5 mg by mouth See admin instructions. Take 5 mg by mouth two times a day- before breakfast and before supper) 60 tablet 3  . cholecalciferol (VITAMIN D) 25 MCG (1000 UT) tablet Take 1,000 Units by mouth 2 (two) times daily.    . Cholecalciferol (VITAMIN D-3) 1000 units CAPS Take 2,000 Units by mouth daily.    . fenofibrate 160 MG tablet Take 160 mg by mouth daily with supper.     . insulin aspart protamine- aspart (NOVOLOG MIX 70/30) (70-30) 100 UNIT/ML injection Inject 20-36 Units into the skin See admin instructions. Inject 36 units into the skin with breakfast and 20 units with supper    . isosorbide mononitrate (IMDUR) 30 MG 24 hr tablet Take 1 tablet (30 mg total) by mouth at bedtime.    Marland Kitchen levothyroxine (SYNTHROID, LEVOTHROID) 50 MCG tablet Take 50 mcg by mouth daily before breakfast.    . metoprolol tartrate (LOPRESSOR) 50 MG tablet Take 1 tablet (50 mg total) by mouth 2 (two) times daily. (Patient taking differently: Take 50 mg by mouth See admin instructions. Take 50 mg by mouth two times a day- before breakfast and before supper) 60 tablet 3  . nitroGLYCERIN (NITROSTAT) 0.4 MG SL tablet Place 1 tablet (0.4 mg total) under the tongue every 5 (five) minutes as needed  for chest pain. 25 tablet 1  . nystatin cream (MYCOSTATIN) Apply 1 application topically 2 (two) times daily. (Patient taking differently: Apply 1 application topically 2 (two) times daily as needed (for irritation of affected area). ) 30 g 3  . olmesartan-hydrochlorothiazide (BENICAR HCT) 40-25 MG tablet Take 1 tablet by mouth daily.    Marland Kitchen oxybutynin (DITROPAN-XL) 5 MG 24 hr tablet Take 1 tablet (5 mg total) by mouth at bedtime. 30 tablet 6  . simvastatin (ZOCOR) 20 MG tablet Take 20 mg by mouth daily after supper.    Marland Kitchen spironolactone (ALDACTONE) 25 MG tablet Take 25 mg by mouth daily.    Marland Kitchen  torsemide (DEMADEX) 20 MG tablet Take 1 tablet (20 mg total) by mouth as needed. As needed for leg edema 30 tablet 3  . triamcinolone ointment (KENALOG) 0.5 % Apply 1 application topically 2 (two) times daily. (Patient taking differently: Apply 1 application topically 2 (two) times daily as needed (for itching). ) 30 g 1  . vitamin B-12 (CYANOCOBALAMIN) 500 MCG tablet Take 500 mcg by mouth 2 (two) times daily.     No current facility-administered medications on file prior to visit.     Review of Systems  Constitution: Negative for chills, decreased appetite, malaise/fatigue and weight gain.  Cardiovascular: Positive for dyspnea on exertion (stable). Negative for chest pain, claudication, leg swelling, orthopnea, palpitations and syncope.  Respiratory: Negative for cough, hemoptysis and wheezing.   Endocrine: Negative for cold intolerance.  Hematologic/Lymphatic: Does not bruise/bleed easily.  Musculoskeletal: Negative for arthritis and joint swelling.  Gastrointestinal: Negative for abdominal pain, anorexia and change in bowel habit.  Neurological: Negative for headaches and light-headedness.  Psychiatric/Behavioral: Negative for depression and substance abuse.  All other systems reviewed and are negative.     Objective:  Blood pressure (!) 146/74, pulse 89, height 5' 2"  (1.575 m), weight 197 lb (89.4  kg). Body mass index is 36.03 kg/m. Physical examination was not performed as it was a telephone only encounter/virtual visit.  Prior office visit evaluation 3 months ago is as below. Physical Exam  Constitutional: She appears well-developed and well-nourished. No distress.  HENT:  Head: Atraumatic.  Eyes: Conjunctivae are normal.  Neck: Neck supple. No JVD present. No thyromegaly present.  Cardiovascular: Normal rate, regular rhythm, S1 normal, S2 normal and intact distal pulses. Exam reveals no gallop.  Murmur heard. High-pitched blowing holosystolic murmur is present with a grade of 2/6 at the apex. Pulses:      Carotid pulses are 2+ on the right side and 2+ on the left side with bruit.      Popliteal pulses are 1+ on the right side and 1+ on the left side.       Dorsalis pedis pulses are 1+ on the right side and 1+ on the left side.       Posterior tibial pulses are 1+ on the right side and 1+ on the left side.  II/VI SEM in the right parasternal border  Pulmonary/Chest: Effort normal and breath sounds normal.  Left infraclavicular pacemaker pocket noted  Abdominal: Soft. Bowel sounds are normal.  Musculoskeletal: Normal range of motion.        General: No edema.  Neurological: She is alert.  Skin: Skin is warm and dry.  Psychiatric: She has a normal mood and affect.   Radiology: No results found. Laboratory Examination:   Labs 01/18/2018: Glucose 191. BUN/Cr 19/1.23. eGFR 43. Na/K 138/4.8  08/18/2017: TSH 5.86.  CMP Latest Ref Rng & Units 10/24/2017 09/01/2017 08/31/2017  Glucose 70 - 99 mg/dL 158(H) 114(H) 198(H)  BUN 8 - 23 mg/dL 16 29(H) 48(H)  Creatinine 0.44 - 1.00 mg/dL 1.37(H) 1.27(H) 1.44(H)  Sodium 135 - 145 mmol/L 135 135 135  Potassium 3.5 - 5.1 mmol/L 4.1 4.4 4.0  Chloride 98 - 111 mmol/L 98 101 95(L)  CO2 22 - 32 mmol/L 27 26 29   Calcium 8.9 - 10.3 mg/dL 9.6 9.2 9.8  Total Protein 6.5 - 8.1 g/dL - - -  Total Bilirubin 0.3 - 1.2 mg/dL - - -  Alkaline Phos  38 - 126 U/L - - -  AST 15 - 41 U/L - - -  ALT 0 - 44 U/L - - -   CBC Latest Ref Rng & Units 10/24/2017 09/01/2017 08/31/2017  WBC 4.0 - 10.5 K/uL 8.2 7.9 8.2  Hemoglobin 12.0 - 15.0 g/dL 12.1 14.4 16.3(H)  Hematocrit 36.0 - 46.0 % 38.6 45.1 49.4(H)  Platelets 150 - 400 K/uL 128(L) 135(L) 164   Lipid Panel     Component Value Date/Time   CHOL 168 08/29/2017 0328   TRIG 544 (H) 08/29/2017 0328   HDL 19 (L) 08/29/2017 0328   CHOLHDL 8.8 08/29/2017 0328   VLDL UNABLE TO CALCULATE IF TRIGLYCERIDE OVER 400 mg/dL 08/29/2017 0328   LDLCALC UNABLE TO CALCULATE IF TRIGLYCERIDE OVER 400 mg/dL 08/29/2017 0328   HEMOGLOBIN A1C Lab Results  Component Value Date   HGBA1C 7.3 (H) 09/30/2015   MPG 163 09/30/2015   TSH Recent Labs    08/28/17 1257  TSH 2.596    Cardiac studies:   Echocardiogram 07/01/2017: Left ventricle cavity is normal in size. Moderate concentric hypertrophy of the left ventricle. Normal global wall motion. Doppler evidence of grade III (restrictive) diastolic dysfunction. Diastolic dysfunction findings suggests elevated LA/LV end diastolic pressure. Calculated EF 60%. Left atrial cavity is moderately dilated at 4.6cm. Moderate (Grade II) mitral regurgitation. Mild tricuspid regurgitation. Mild pulmonary hypertension. Estimated pulmonary artery systolic pressure 36 mmHg IVC is normal with blunted respiratory response, suggests elevated central venous pressure.  Carotid Doppler [02/10/2014]: No evidence of hemodynamically significant stenosis in the bilateral carotid bifurcation vessels. There is mild evidence of homogeneous plaque in bilateral carotid arteries. No significant change from 10/13/2011. Coronary Angiogram [09/30/2011]: Left and Right Heart Cath: Moderate pulmonary hypertension. Moderate Mitral stenosis. Moderate CAD. Mid right coronary artery with a 50% stenosis and distal circumflex coronary artery with a 60 to at most 70% stenosis. Chest X-ray  [10/17/2015]: Mild cardiomegaly and pulmonary edema. Mild chronic bronchitic changes and bibasilar atelectasis/scarring. Colonoscopy [02/2010]: Normal.  Lexiscan myoview stress test 06/15/2017: 1. Lexiscan stress test was performed. Exercise capacity was not assessed. Stress symptoms included dizziness. Peak blood pressure 158/66 mmHg. Stress EKG is non diagnostic for ischemia as it is a pharmacologic stress. In addition, it demonstrated atrial pacing and incomplete LBBB. 2. The overall quality of the study is excellent. There is no evidence of abnormal lung activity. Stress and rest SPECT images demonstrate homogeneous tracer distribution throughout the myocardium. Gated SPECT imaging reveals normal myocardial thickening and wall motion. The left ventricular ejection fraction was normal (71%). 3. Low risk study.  Sleep Study [08/11/2017]: Positive for Complex Sleep Apnea; On CPAP   R&LHC 08/31/2017: LCx: Nondominant. AV grove LCx 50-60% diffuse disease. RCA: Large dominant. Ostial PDA 50% stenosis, FFR 0.96.  RA: 5 mmHg RV: 45/4 mmHg. PA: 40/12 mmHg. Mean PA 29 mmHg PCWP: 16 mmHg LV: 130/3 mmHg, LVEDP 13 mmHg  CO: 3.6 L/min. CI 1.9 L/min/m2   Assessment:    Chronic diastolic heart failure (HCC)  Coronary artery disease involving native coronary artery of native heart without angina pectoris  Paroxysmal atrial fibrillation (HCC) CHA2DS2-VASc Score is 6 with yearly risk of stroke of 9.8%.   Essential hypertension  Cardiac pacemaker in situ on 04/30/2015 [employer] Adapta Dr Peggyann Juba; Leads only MRI compatible: Sinus node dysfunction  Class 2 severe obesity due to excess calories with serious comorbidity and body mass index (BMI) of 35.0 to 35.9 in adult [facility])  EKG 06/11/2017: A paced rhythm with first-degree AV block at the rate of 71 bpm, incomplete left under branch block.  LVH with repolarization abnormality.  Cannot exclude anterolateral infarct old. No significant change from  EKG 11/03/2016   Remote  Pacemaker transmission 03/05/2018: No mode switches. No VHR episodes. Battery longevity is 8-11.5 years. RA pacing is 99.9 %, RV pacing is 16 %  Recommendations:    Patient was unable to connect to the medial, this is a telephone encounter.  She has not noticed any acute exacerbation in dyspnea or leg edema.  Advised her that she should contact me immediately if she notices any worsening dyspnea as to avoid hospitalization from heart failure.  Blood pressure has been relatively well controlled except for today.  Hence I did not make any changes to her medications.  With regard to atrial fibrillation, we'll reduce the dose of amiodarone from 200 mg to 100 mg daily to reduce the risk of long-term side effects.  She is presently doing well with regard to her diabetes as well and she is also been compliant with BiPAP.  Pacemaker is functioning normally and she has not had any high rate episodes recently.  I'd like to see her back in 3 months for follow-up. She probably will need a repeat echocardiogram to follow-up on valvular heart disease.  Adrian Prows, MD, [facility] 05/04/2018, 2:21 PM [facility]. [place] Pager: [phone] Office: [phone] If no answer Cell [phone]

## 2018-05-04 ENCOUNTER — Other Ambulatory Visit: Payer: Self-pay

## 2018-05-04 ENCOUNTER — Encounter: Payer: Self-pay | Admitting: Cardiology

## 2018-05-04 ENCOUNTER — Ambulatory Visit (INDEPENDENT_AMBULATORY_CARE_PROVIDER_SITE_OTHER): Payer: Medicare HMO | Admitting: Cardiology

## 2018-05-04 VITALS — BP 146/74 | HR 89 | Ht 62.0 in | Wt 197.0 lb

## 2018-05-04 DIAGNOSIS — Z6835 Body mass index (BMI) 35.0-35.9, adult: Secondary | ICD-10-CM | POA: Diagnosis not present

## 2018-05-04 DIAGNOSIS — I5032 Chronic diastolic (congestive) heart failure: Secondary | ICD-10-CM

## 2018-05-04 DIAGNOSIS — I2510 Atherosclerotic heart disease of native coronary artery without angina pectoris: Secondary | ICD-10-CM | POA: Diagnosis not present

## 2018-05-04 DIAGNOSIS — Z950 Presence of cardiac pacemaker: Secondary | ICD-10-CM | POA: Diagnosis not present

## 2018-05-04 DIAGNOSIS — I10 Essential (primary) hypertension: Secondary | ICD-10-CM | POA: Diagnosis not present

## 2018-05-04 DIAGNOSIS — I480 Paroxysmal atrial fibrillation: Secondary | ICD-10-CM | POA: Diagnosis not present

## 2018-05-18 ENCOUNTER — Other Ambulatory Visit: Payer: Self-pay | Admitting: Cardiology

## 2018-05-18 NOTE — Telephone Encounter (Signed)
Please fill

## 2018-05-19 DIAGNOSIS — Z794 Long term (current) use of insulin: Secondary | ICD-10-CM | POA: Diagnosis not present

## 2018-05-19 DIAGNOSIS — E119 Type 2 diabetes mellitus without complications: Secondary | ICD-10-CM | POA: Diagnosis not present

## 2018-05-20 DIAGNOSIS — G4733 Obstructive sleep apnea (adult) (pediatric): Secondary | ICD-10-CM | POA: Diagnosis not present

## 2018-05-28 DIAGNOSIS — G4733 Obstructive sleep apnea (adult) (pediatric): Secondary | ICD-10-CM | POA: Diagnosis not present

## 2018-06-02 DIAGNOSIS — I5033 Acute on chronic diastolic (congestive) heart failure: Secondary | ICD-10-CM | POA: Diagnosis not present

## 2018-06-02 DIAGNOSIS — R069 Unspecified abnormalities of breathing: Secondary | ICD-10-CM | POA: Diagnosis not present

## 2018-06-06 ENCOUNTER — Other Ambulatory Visit: Payer: Self-pay | Admitting: Adult Health

## 2018-06-09 DIAGNOSIS — E039 Hypothyroidism, unspecified: Secondary | ICD-10-CM | POA: Diagnosis not present

## 2018-06-09 DIAGNOSIS — I129 Hypertensive chronic kidney disease with stage 1 through stage 4 chronic kidney disease, or unspecified chronic kidney disease: Secondary | ICD-10-CM | POA: Diagnosis not present

## 2018-06-09 DIAGNOSIS — E785 Hyperlipidemia, unspecified: Secondary | ICD-10-CM | POA: Diagnosis not present

## 2018-06-10 DIAGNOSIS — G4733 Obstructive sleep apnea (adult) (pediatric): Secondary | ICD-10-CM | POA: Diagnosis not present

## 2018-06-10 DIAGNOSIS — E1122 Type 2 diabetes mellitus with diabetic chronic kidney disease: Secondary | ICD-10-CM | POA: Diagnosis not present

## 2018-06-10 DIAGNOSIS — G609 Hereditary and idiopathic neuropathy, unspecified: Secondary | ICD-10-CM | POA: Diagnosis not present

## 2018-06-10 DIAGNOSIS — Z0000 Encounter for general adult medical examination without abnormal findings: Secondary | ICD-10-CM | POA: Diagnosis not present

## 2018-06-10 DIAGNOSIS — J309 Allergic rhinitis, unspecified: Secondary | ICD-10-CM | POA: Diagnosis not present

## 2018-06-10 DIAGNOSIS — E039 Hypothyroidism, unspecified: Secondary | ICD-10-CM | POA: Diagnosis not present

## 2018-06-10 DIAGNOSIS — E8809 Other disorders of plasma-protein metabolism, not elsewhere classified: Secondary | ICD-10-CM | POA: Diagnosis not present

## 2018-06-10 DIAGNOSIS — E785 Hyperlipidemia, unspecified: Secondary | ICD-10-CM | POA: Diagnosis not present

## 2018-06-10 DIAGNOSIS — I480 Paroxysmal atrial fibrillation: Secondary | ICD-10-CM | POA: Diagnosis not present

## 2018-06-10 DIAGNOSIS — Z23 Encounter for immunization: Secondary | ICD-10-CM | POA: Diagnosis not present

## 2018-06-10 DIAGNOSIS — G2581 Restless legs syndrome: Secondary | ICD-10-CM | POA: Diagnosis not present

## 2018-06-10 DIAGNOSIS — Z79899 Other long term (current) drug therapy: Secondary | ICD-10-CM | POA: Diagnosis not present

## 2018-06-10 DIAGNOSIS — I5032 Chronic diastolic (congestive) heart failure: Secondary | ICD-10-CM | POA: Diagnosis not present

## 2018-06-10 DIAGNOSIS — E559 Vitamin D deficiency, unspecified: Secondary | ICD-10-CM | POA: Diagnosis not present

## 2018-06-11 DIAGNOSIS — I495 Sick sinus syndrome: Secondary | ICD-10-CM | POA: Diagnosis not present

## 2018-06-11 DIAGNOSIS — Z4502 Encounter for adjustment and management of automatic implantable cardiac defibrillator: Secondary | ICD-10-CM | POA: Diagnosis not present

## 2018-06-11 DIAGNOSIS — Z950 Presence of cardiac pacemaker: Secondary | ICD-10-CM | POA: Diagnosis not present

## 2018-06-18 DIAGNOSIS — G609 Hereditary and idiopathic neuropathy, unspecified: Secondary | ICD-10-CM | POA: Diagnosis not present

## 2018-06-18 DIAGNOSIS — E785 Hyperlipidemia, unspecified: Secondary | ICD-10-CM | POA: Diagnosis not present

## 2018-06-18 DIAGNOSIS — E559 Vitamin D deficiency, unspecified: Secondary | ICD-10-CM | POA: Diagnosis not present

## 2018-06-18 DIAGNOSIS — E1122 Type 2 diabetes mellitus with diabetic chronic kidney disease: Secondary | ICD-10-CM | POA: Diagnosis not present

## 2018-06-18 DIAGNOSIS — I129 Hypertensive chronic kidney disease with stage 1 through stage 4 chronic kidney disease, or unspecified chronic kidney disease: Secondary | ICD-10-CM | POA: Diagnosis not present

## 2018-06-18 DIAGNOSIS — E039 Hypothyroidism, unspecified: Secondary | ICD-10-CM | POA: Diagnosis not present

## 2018-06-20 DIAGNOSIS — G4733 Obstructive sleep apnea (adult) (pediatric): Secondary | ICD-10-CM | POA: Diagnosis not present

## 2018-06-23 ENCOUNTER — Encounter: Payer: Self-pay | Admitting: Cardiology

## 2018-06-24 ENCOUNTER — Telehealth: Payer: Self-pay | Admitting: Hematology

## 2018-06-24 NOTE — Telephone Encounter (Signed)
Received a hem referral from Dr. Chalmers Cater at [facility]. Pt has been cld and scheduled to see Dr. Irene Limbo on 6/17 at 11am. She's been made aware to arrive 20 minutes early.

## 2018-06-29 NOTE — Progress Notes (Signed)
HEMATOLOGY/ONCOLOGY CONSULTATION NOTE  Date of Service: 06/30/2018  Patient Care Team: Deland Pretty, MD as PCP - General (Internal Medicine) Adrian Prows, MD as Consulting Physician (Cardiology) Jacelyn Pi, MD as Consulting Physician (Endocrinology)  CHIEF COMPLAINTS/PURPOSE OF CONSULTATION:  Monoclonal Paraproteinemia  HISTORY OF PRESENTING ILLNESS:   [name] is a wonderful 73 y.o. female who has been referred to [place] by Dr. Jacelyn Pi for evaluation and management of Monoclonal Paraproteinemia. The pt reports that she is doing well overall.   The pt reports that she was seen to have high calcium levels with her endocrinologist, as noted below, and was evaluated further with an SPEP which revealed 0.5g of monoclonal protein, for which she has been referred to me. The pt has CKD which she notes is thought to be related to her DM, which she notes has been recently well controlled. The pt notes that she recently lifted a reclining chair and hurt her hip after doing this, which she notes has resoled. She denies any other new or acute bone or joint pains.   The pt notes that she has been feeling tired lately, but denies any acute changes in her energy levels. She adds that she has CHF and has experienced ease of SOB when walking. She takes Torsemide as needed. She notes that she is the primary and sole caregiver of her disabled husband. She relates her tiredness to these two aspects.   The pt notes that she has a pacemaker and that her heart symptoms have been stable. She has not been able to have a Cardiac MRI due to her pacemaker.  Most recent lab results (06/09/18) of CBC w/diff and CMP is as follows: all values are WNL except for BUN at 27, Creatinine at 1.5, GFR at 37.38, Calcium at 10.6. 06/10/18 SPEP revealed M Protein at 0.5g  On review of systems, pt reports some SOB when walking, feeling tired, staying active, eating well, and denies new bone pains, new joint pains,  concerns for infections, leg swelling, abdominal pains, and any other symptoms.   On PMHx the pt reports CKD, DM, CHF. On Social Hx the pt reports that she is a primary caregiver to her disabled husband.   MEDICAL HISTORY:  Past Medical History:  Diagnosis Date  . Arthritis   . Diabetes mellitus without complication ([place])   . History of chickenpox   . Hx of psoriatic arthritis   . Hyperlipemia   . Hypertension   . Hypertension 05/03/2018  . Mitral valve disorders(424.0)   . Obstructive sleep apnea   . Pacemaker   . Peripheral neuropathy   . Renal disorder Kidney disease stage2  . Thyroid disease hypothyroid  . Urine incontinence   . Vitamin D deficiency     SURGICAL HISTORY: Past Surgical History:  Procedure Laterality Date  . CARDIAC CATHETERIZATION N/A 04/29/2015   Procedure: Temporary Pacemaker;  Surgeon: Adrian Prows, MD;  Location: [place] [facility];  Service: Cardiovascular;  Laterality: N/A;  . EP IMPLANTABLE DEVICE N/A 04/30/2015   Procedure: Pacemaker Implant;  Surgeon: Evans Lance, MD;  Location: [place] [facility];  Service: Cardiovascular;  Laterality: N/A;  . INTRAVASCULAR PRESSURE WIRE/FFR STUDY N/A 08/31/2017   Procedure: INTRAVASCULAR PRESSURE WIRE/FFR STUDY;  Surgeon: Nigel Mormon, MD;  Location: [place] [facility];  Service: Cardiovascular;  Laterality: N/A;  . LEFT AND RIGHT HEART CATHETERIZATION WITH CORONARY ANGIOGRAM N/A 09/30/2011   Procedure: LEFT AND RIGHT HEART CATHETERIZATION WITH CORONARY ANGIOGRAM;  Surgeon:  Laverda Page, MD;  Location: [facility];  Service: Cardiovascular;  Laterality: N/A;  . RIGHT/LEFT HEART CATH AND CORONARY ANGIOGRAPHY N/A 08/31/2017   Procedure: RIGHT/LEFT HEART CATH AND CORONARY ANGIOGRAPHY;  Surgeon: Nigel Mormon, MD;  Location: [place] [facility];  Service: Cardiovascular;  Laterality: N/A;  . YAG LASER APPLICATION Right 8/75/6433   Procedure: YAG LASER APPLICATION;  Surgeon: Elta Guadeloupe T. Gershon Crane, MD;   Location: [facility];  Service: Ophthalmology;  Laterality: Right;  pt knows to arrive at 11:15  . YAG LASER APPLICATION Left 02/21/5186   Procedure: YAG LASER APPLICATION;  Surgeon: Rutherford Guys, MD;  Location: [facility];  Service: Ophthalmology;  Laterality: Left;    SOCIAL HISTORY: Social History   Socioeconomic History  . Marital status: Married    Spouse name: Not on file  . Number of children: 3  . Years of education: Not on file  . Highest education level: Not on file  Occupational History  . Not on file  Social Needs  . Financial resource strain: Not on file  . Food insecurity    Worry: Not on file    Inability: Not on file  . Transportation needs    Medical: Not on file    Non-medical: Not on file  Tobacco Use  . Smoking status: Never Smoker  . Smokeless tobacco: Never Used  Substance and Sexual Activity  . Alcohol use: No    Alcohol/week: 0.0 standard drinks  . Drug use: No  . Sexual activity: Not Currently  Lifestyle  . Physical activity    Days per week: Not on file    Minutes per session: Not on file  . Stress: Not on file  Relationships  . Social Herbalist on phone: Not on file    Gets together: Not on file    Attends religious service: Not on file    Active member of club or organization: Not on file    Attends meetings of clubs or organizations: Not on file    Relationship status: Not on file  . Intimate partner violence    Fear of current or ex partner: Not on file    Emotionally abused: Not on file    Physically abused: Not on file    Forced sexual activity: Not on file  Other Topics Concern  . Not on file  Social History Narrative  . Not on file    FAMILY HISTORY: Family History  Problem Relation Age of Onset  . Arthritis Mother   . Diabetes Mother   . Heart disease Mother   . Hyperlipidemia Mother   . Hypertension Mother   . Arthritis Father   . Asthma Father   . Heart attack Father   . Hyperlipidemia Father   . Hypertension  Father   . Stroke Father   . Arthritis Sister   . Diabetes Sister   . Hypertension Sister   . Arthritis Brother   . Heart attack Brother   . Heart disease Brother   . Hyperlipidemia Brother   . Hypertension Brother   . Alcohol abuse Brother   . Arthritis Brother   . Diabetes Brother   . Early death Brother   . Heart disease Brother   . Hyperlipidemia Brother   . Hypertension Brother   . Arthritis Sister   . Cancer Sister   . Diabetes Sister   . Hyperlipidemia Sister   . Hypertension Sister   . Stroke Sister   . Cancer Sister   .  Hyperlipidemia Sister   . Hypertension Sister   . Hypertension Daughter     ALLERGIES:  is allergic to hydrocodone; invokana [canagliflozin]; and oxycodone.  MEDICATIONS:  Current Outpatient Medications  Medication Sig Dispense Refill  . amiodarone (PACERONE) 200 MG tablet Take 100 mg by mouth daily.    Marland Kitchen apixaban (ELIQUIS) 5 MG TABS tablet Take 1 tablet (5 mg total) by mouth 2 (two) times daily. (Patient taking differently: Take 5 mg by mouth See admin instructions. Take 5 mg by mouth two times a day- before breakfast and before supper) 60 tablet 3  . cholecalciferol (VITAMIN D) 25 MCG (1000 UT) tablet Take 1,000 Units by mouth 2 (two) times daily.    . Cholecalciferol (VITAMIN D-3) 1000 units CAPS Take 2,000 Units by mouth daily.    . fenofibrate 160 MG tablet Take 160 mg by mouth daily with supper.     . insulin aspart protamine- aspart (NOVOLOG MIX 70/30) (70-30) 100 UNIT/ML injection Inject 20-36 Units into the skin See admin instructions. Inject 36 units into the skin with breakfast and 20 units with supper    . isosorbide mononitrate (IMDUR) 30 MG 24 hr tablet Take 1 tablet (30 mg total) by mouth at bedtime.    Marland Kitchen levothyroxine (SYNTHROID, LEVOTHROID) 50 MCG tablet Take 50 mcg by mouth daily before breakfast.    . metoprolol tartrate (LOPRESSOR) 50 MG tablet Take 1 tablet by mouth twice daily 180 tablet 1  . nitroGLYCERIN (NITROSTAT) 0.4 MG SL  tablet Place 1 tablet (0.4 mg total) under the tongue every 5 (five) minutes as needed for chest pain. 25 tablet 1  . nystatin cream (MYCOSTATIN) Apply 1 application topically 2 (two) times daily. (Patient taking differently: Apply 1 application topically 2 (two) times daily as needed (for irritation of affected area). ) 30 g 3  . olmesartan-hydrochlorothiazide (BENICAR HCT) 40-25 MG tablet Take 1 tablet by mouth daily.    Marland Kitchen oxybutynin (DITROPAN-XL) 5 MG 24 hr tablet TAKE 1 TABLET BY MOUTH AT BEDTIME 30 tablet 3  . simvastatin (ZOCOR) 20 MG tablet TAKE 1 TABLET BY MOUTH IN THE EVENING AFTER DINNER - DISCONTINUE PRAVASTATIN - MYALGIA 90 tablet 1  . spironolactone (ALDACTONE) 25 MG tablet Take 25 mg by mouth daily.    Marland Kitchen torsemide (DEMADEX) 20 MG tablet Take 1 tablet (20 mg total) by mouth as needed. As needed for leg edema 30 tablet 3  . triamcinolone ointment (KENALOG) 0.5 % Apply 1 application topically 2 (two) times daily. (Patient taking differently: Apply 1 application topically 2 (two) times daily as needed (for itching). ) 30 g 1  . vitamin B-12 (CYANOCOBALAMIN) 500 MCG tablet Take 500 mcg by mouth 2 (two) times daily.     No current facility-administered medications for this visit.     REVIEW OF SYSTEMS:    10 Point review of Systems was done is negative except as noted above.  PHYSICAL EXAMINATION:  . Vitals:   06/30/18 1054  BP: (!) 142/76  Pulse: 93  Resp: 18  Temp: 98.7 F (37.1 C)  SpO2: 94%   Filed Weights   06/30/18 1054  Weight: 200 lb 6.4 oz (90.9 kg)   .Body mass index is 36.65 kg/m.  GENERAL:alert, in no acute distress and comfortable SKIN: no acute rashes, no significant lesions EYES: conjunctiva are pink and non-injected, sclera anicteric OROPHARYNX: MMM, no exudates, no oropharyngeal erythema or ulceration NECK: supple, no JVD LYMPH:  no palpable lymphadenopathy in the cervical, axillary or inguinal regions  LUNGS: clear to auscultation b/l with normal  respiratory effort HEART: regular rate & rhythm ABDOMEN:  normoactive bowel sounds , non tender, not distended. Extremity: no pedal edema PSYCH: alert & oriented x 3 with fluent speech NEURO: no focal motor/sensory deficits  LABORATORY DATA:  I have reviewed the data as listed  . CBC Latest Ref Rng & Units 10/24/2017 09/01/2017 08/31/2017  WBC 4.0 - 10.5 K/uL 8.2 7.9 8.2  Hemoglobin 12.0 - 15.0 g/dL 12.1 14.4 16.3(H)  Hematocrit 36.0 - 46.0 % 38.6 45.1 49.4(H)  Platelets 150 - 400 K/uL 128(L) 135(L) 164    . CMP Latest Ref Rng & Units 10/24/2017 09/01/2017 08/31/2017  Glucose 70 - 99 mg/dL 158(H) 114(H) 198(H)  BUN 8 - 23 mg/dL 16 29(H) 48(H)  Creatinine 0.44 - 1.00 mg/dL 1.37(H) 1.27(H) 1.44(H)  Sodium 135 - 145 mmol/L 135 135 135  Potassium 3.5 - 5.1 mmol/L 4.1 4.4 4.0  Chloride 98 - 111 mmol/L 98 101 95(L)  CO2 22 - 32 mmol/L 27 26 29   Calcium 8.9 - 10.3 mg/dL 9.6 9.2 9.8  Total Protein 6.5 - 8.1 g/dL - - -  Total Bilirubin 0.3 - 1.2 mg/dL - - -  Alkaline Phos 38 - 126 U/L - - -  AST 15 - 41 U/L - - -  ALT 0 - 44 U/L - - -    06/10/18 SPEP:    06/09/18 CBC w/diff and CMP:       RADIOGRAPHIC STUDIES: I have personally reviewed the radiological images as listed and agreed with the findings in the report. No results found.  ASSESSMENT & PLAN:  73 y.o. female with  1. Monoclonal paraproteinemia PLAN -Discussed patient's most recent labs: 06/09/18 CBC w/diff revealed normal WBC at 6.2k, normal HGB at 14.3, and normal PLT at 157k. 06/09/18 CMP revealed Creatinine at 1.5 and Calcium at 10.6. Alk Phos normal. Ionized calcium normal. -Discussed the 06/10/18 SPEP which revealed M Protein at 0.5g -Discussed and reviewed the 06/08/17 ECHO which revealed LV EF of 65-70% -Discussed the CRAB criteria with the pt: pt does have higher calcium levels, but also high Vitamin D. Her renal function is otherwise explained with existing CKD thought to be related to her DM. Pt does not have  anemia. No overt concern for bone tumors. -Discussed the impending work up I would recommend including blood tests as noted below , a 24hr UPEP, and a baseline Bone Survey -Would review all of these results prior to considering the necessity of a BM Bx -Cardiology could consider Technetium study or fat pad biopsy to rule out AL Amyloidosis, if clinical suspicion warrants this. -Will order labs today -Will order 24hr UPEP -Will order Bone Survey -Will see the pt back in 3 weeks   Labs today Whole body bone survey in 5-7 days Telephone visit with Dr Irene Limbo in 3 weeks  . Orders Placed This Encounter  Procedures  . DG Bone Survey Met    Standing Status:   Future    Standing Expiration Date:   08/30/2019    Order Specific Question:   Reason for Exam (SYMPTOM  OR DIAGNOSIS REQUIRED)    Answer:   staging monoclonal paraproteinemia    Order Specific Question:   Preferred imaging location?    Answer:   [facility]  . CBC with Differential/Platelet    Standing Status:   Future    Number of Occurrences:   1    Standing Expiration Date:   08/04/2019  .  CMP ([place] only)    Standing Status:   Future    Number of Occurrences:   1    Standing Expiration Date:   06/30/2019  . Multiple Myeloma Panel (SPEP&IFE w/QIG)    Standing Status:   Future    Number of Occurrences:   1    Standing Expiration Date:   06/30/2019  . Kappa/lambda light chains    Standing Status:   Future    Number of Occurrences:   1    Standing Expiration Date:   08/04/2019  . Sedimentation rate    Standing Status:   Future    Number of Occurrences:   1    Standing Expiration Date:   06/30/2019  . Beta 2 microglobulin, serum    Standing Status:   Future    Number of Occurrences:   1    Standing Expiration Date:   06/30/2019  . Lactate dehydrogenase    Standing Status:   Future    Number of Occurrences:   1    Standing Expiration Date:   06/30/2019  . 24-Hr Ur UPEP/UIFE/Light Chains/TP    Standing Status:    Future    Number of Occurrences:   1    Standing Expiration Date:   06/30/2019    All of the patients questions were answered with apparent satisfaction. The patient knows to call the clinic with any problems, questions or concerns.  The total time spent in the appt was 45 minutes and more than 50% was on counseling and direct patient cares.    Sullivan Lone MD MS AAHIVMS [facility] Hematology/Oncology Physician [facility]  (Office):       [phone] (Work cell):  [phone] (Fax):           [phone]  06/30/2018 11:55 AM  I, Baldwin Jamaica, am acting as a scribe for Dr. Sullivan Lone.   .I have reviewed the above documentation for accuracy and completeness, and I agree with the above. Brunetta Genera MD

## 2018-06-30 ENCOUNTER — Inpatient Hospital Stay: Payer: Medicare HMO

## 2018-06-30 ENCOUNTER — Inpatient Hospital Stay: Payer: Medicare HMO | Attending: Hematology | Admitting: Hematology

## 2018-06-30 ENCOUNTER — Other Ambulatory Visit: Payer: Self-pay

## 2018-06-30 VITALS — BP 142/76 | HR 93 | Temp 98.7°F | Resp 18 | Ht 62.0 in | Wt 200.4 lb

## 2018-06-30 DIAGNOSIS — Z794 Long term (current) use of insulin: Secondary | ICD-10-CM

## 2018-06-30 DIAGNOSIS — D472 Monoclonal gammopathy: Secondary | ICD-10-CM

## 2018-06-30 DIAGNOSIS — I10 Essential (primary) hypertension: Secondary | ICD-10-CM | POA: Insufficient documentation

## 2018-06-30 DIAGNOSIS — Z950 Presence of cardiac pacemaker: Secondary | ICD-10-CM | POA: Insufficient documentation

## 2018-06-30 DIAGNOSIS — N189 Chronic kidney disease, unspecified: Secondary | ICD-10-CM | POA: Diagnosis not present

## 2018-06-30 DIAGNOSIS — Z79899 Other long term (current) drug therapy: Secondary | ICD-10-CM | POA: Diagnosis not present

## 2018-06-30 DIAGNOSIS — E119 Type 2 diabetes mellitus without complications: Secondary | ICD-10-CM | POA: Insufficient documentation

## 2018-06-30 LAB — CBC WITH DIFFERENTIAL/PLATELET
Abs Immature Granulocytes: 0.02 10*3/uL (ref 0.00–0.07)
Basophils Absolute: 0 10*3/uL (ref 0.0–0.1)
Basophils Relative: 1 %
Eosinophils Absolute: 0.1 10*3/uL (ref 0.0–0.5)
Eosinophils Relative: 1 %
HCT: 43.6 % (ref 36.0–46.0)
Hemoglobin: 13.7 g/dL (ref 12.0–15.0)
Immature Granulocytes: 0 %
Lymphocytes Relative: 30 %
Lymphs Abs: 1.9 10*3/uL (ref 0.7–4.0)
MCH: 29.5 pg (ref 26.0–34.0)
MCHC: 31.4 g/dL (ref 30.0–36.0)
MCV: 94 fL (ref 80.0–100.0)
Monocytes Absolute: 0.5 10*3/uL (ref 0.1–1.0)
Monocytes Relative: 8 %
Neutro Abs: 3.8 10*3/uL (ref 1.7–7.7)
Neutrophils Relative %: 60 %
Platelets: 152 10*3/uL (ref 150–400)
RBC: 4.64 MIL/uL (ref 3.87–5.11)
RDW: 14.6 % (ref 11.5–15.5)
WBC: 6.3 10*3/uL (ref 4.0–10.5)
nRBC: 0 % (ref 0.0–0.2)

## 2018-06-30 LAB — CMP (CANCER CENTER ONLY)
ALT: 23 U/L (ref 0–44)
AST: 25 U/L (ref 15–41)
Albumin: 3.5 g/dL (ref 3.5–5.0)
Alkaline Phosphatase: 29 U/L — ABNORMAL LOW (ref 38–126)
Anion gap: 10 (ref 5–15)
BUN: 19 mg/dL (ref 8–23)
CO2: 26 mmol/L (ref 22–32)
Calcium: 10.1 mg/dL (ref 8.9–10.3)
Chloride: 104 mmol/L (ref 98–111)
Creatinine: 1.29 mg/dL — ABNORMAL HIGH (ref 0.44–1.00)
GFR, Est AFR Am: 47 mL/min — ABNORMAL LOW (ref >60)
GFR, Estimated: 40 mL/min — ABNORMAL LOW (ref >60)
Glucose, Bld: 119 mg/dL — ABNORMAL HIGH (ref 70–99)
Potassium: 4.2 mmol/L (ref 3.5–5.1)
Sodium: 140 mmol/L (ref 135–145)
Total Bilirubin: 0.3 mg/dL (ref 0.3–1.2)
Total Protein: 7.6 g/dL (ref 6.5–8.1)

## 2018-06-30 LAB — LACTATE DEHYDROGENASE: LDH: 188 U/L (ref 98–192)

## 2018-06-30 LAB — SEDIMENTATION RATE: Sed Rate: 1 mm/hr (ref 0–22)

## 2018-06-30 NOTE — Patient Instructions (Signed)
Thank you for choosing [facility] to provide your oncology and hematology care.   Should you have questions after your visit to the [facility] ([facility]), please contact this office at [phone] between 8:30 AM and 4:30 PM. Voicemails left after 4:00 PM may not be returned until the following business day. Calls received after 4:30 PM will be answered by an off-site Nurse Triage Line.    Prescription Refills:  Please have your pharmacy contact us directly for most prescription requests.  Contact the office directly for refills of narcotics (pain medications). Allow 48-72 hours for refills.  Appointments: Please contact the [facility] scheduling department [phone] for questions regarding [facility] appointment scheduling.  Contact the schedulers with any scheduling changes so that your appointment can be rescheduled in a timely manner.   Central Scheduling for [facility] [place] [phone] - Call to schedule procedures such as PET scans, CT scans, MRI, Ultrasound, etc.  To afford each patient quality time with our providers, please arrive 30 minutes before your scheduled appointment time.  If you arrive late for your appointment, you may be asked to reschedule.  We strive to give you quality time with our providers, and arriving late affects you and other patients whose appointments are after yours. If you are a no show for multiple scheduled visits, you may be dismissed from the clinic at the providers discretion.     Resources: [facility] Social Workers [phone] for additional information on assistance programs --Anne Cunningham/Abigail Elmore  [place] DSS  [phone]: Information regarding food stamps, Medicaid, and utility assistance [employer] [phone]   [place] [employer]'s shared-ride transportation service for eligible riders who have a disability that prevents them from riding the fixed route bus.   Medicare Rights Center 800-333-4114 Helps people with  Medicare understand their rights and benefits, navigate the Medicare system, and secure the quality healthcare they deserve [employer] 800-227-2345 Assists patients locate various types of support and financial assistance Cancer Care: 1-800-813-HOPE (4673) Provides financial assistance, online support groups, medication/co-pay assistance.      

## 2018-07-01 ENCOUNTER — Telehealth: Payer: Self-pay | Admitting: Hematology

## 2018-07-01 LAB — BETA 2 MICROGLOBULIN, SERUM: Beta-2 Microglobulin: 2.9 mg/L — ABNORMAL HIGH (ref 0.6–2.4)

## 2018-07-01 LAB — KAPPA/LAMBDA LIGHT CHAINS
Kappa free light chain: 32.2 mg/L — ABNORMAL HIGH (ref 3.3–19.4)
Kappa, lambda light chain ratio: 2.15 — ABNORMAL HIGH (ref 0.26–1.65)
Lambda free light chains: 15 mg/L (ref 5.7–26.3)

## 2018-07-01 NOTE — Telephone Encounter (Signed)
Scheduled appt per 6/17 los. Spoke with patient and she is aware of her appt date and time. °

## 2018-07-02 LAB — MULTIPLE MYELOMA PANEL, SERUM
Albumin SerPl Elph-Mcnc: 3.8 g/dL (ref 2.9–4.4)
Albumin/Glob SerPl: 1.2 (ref 0.7–1.7)
Alpha 1: 0.3 g/dL (ref 0.0–0.4)
Alpha2 Glob SerPl Elph-Mcnc: 0.7 g/dL (ref 0.4–1.0)
B-Globulin SerPl Elph-Mcnc: 1.3 g/dL (ref 0.7–1.3)
Gamma Glob SerPl Elph-Mcnc: 0.9 g/dL (ref 0.4–1.8)
Globulin, Total: 3.2 g/dL (ref 2.2–3.9)
IgA: 156 mg/dL (ref 64–422)
IgG (Immunoglobin G), Serum: 978 mg/dL (ref 586–1602)
IgM (Immunoglobulin M), Srm: 29 mg/dL (ref 26–217)
Total Protein ELP: 7 g/dL (ref 6.0–8.5)

## 2018-07-03 DIAGNOSIS — R069 Unspecified abnormalities of breathing: Secondary | ICD-10-CM | POA: Diagnosis not present

## 2018-07-03 DIAGNOSIS — I5033 Acute on chronic diastolic (congestive) heart failure: Secondary | ICD-10-CM | POA: Diagnosis not present

## 2018-07-05 ENCOUNTER — Other Ambulatory Visit: Payer: Self-pay | Admitting: Cardiology

## 2018-07-05 DIAGNOSIS — D472 Monoclonal gammopathy: Secondary | ICD-10-CM | POA: Diagnosis not present

## 2018-07-05 DIAGNOSIS — Z950 Presence of cardiac pacemaker: Secondary | ICD-10-CM | POA: Diagnosis not present

## 2018-07-05 DIAGNOSIS — Z794 Long term (current) use of insulin: Secondary | ICD-10-CM | POA: Diagnosis not present

## 2018-07-05 DIAGNOSIS — Z79899 Other long term (current) drug therapy: Secondary | ICD-10-CM | POA: Diagnosis not present

## 2018-07-05 DIAGNOSIS — E119 Type 2 diabetes mellitus without complications: Secondary | ICD-10-CM | POA: Diagnosis not present

## 2018-07-05 DIAGNOSIS — N189 Chronic kidney disease, unspecified: Secondary | ICD-10-CM | POA: Diagnosis not present

## 2018-07-05 DIAGNOSIS — I10 Essential (primary) hypertension: Secondary | ICD-10-CM | POA: Diagnosis not present

## 2018-07-05 NOTE — Telephone Encounter (Signed)
Please fill

## 2018-07-06 ENCOUNTER — Other Ambulatory Visit: Payer: Self-pay

## 2018-07-06 DIAGNOSIS — D472 Monoclonal gammopathy: Secondary | ICD-10-CM

## 2018-07-07 ENCOUNTER — Other Ambulatory Visit: Payer: Self-pay | Admitting: Adult Health

## 2018-07-07 LAB — UPEP/UIFE/LIGHT CHAINS/TP, 24-HR UR
% BETA, Urine: 0 %
ALPHA 1 URINE: 0 %
Albumin, U: 100 %
Alpha 2, Urine: 0 %
Free Kappa Lt Chains,Ur: 3.64 mg/L (ref 0.63–113.79)
Free Kappa/Lambda Ratio: >5.6 (ref 1.03–31.76)
Free Lambda Lt Chains,Ur: <0.65 mg/L (ref 0.47–11.77)
GAMMA GLOBULIN URINE: 0 %
Total Protein, Urine-Ur/day: 324 mg/24 hr — ABNORMAL HIGH (ref 30–150)
Total Protein, Urine: 10.8 mg/dL
Total Volume: 3000

## 2018-07-20 DIAGNOSIS — G4733 Obstructive sleep apnea (adult) (pediatric): Secondary | ICD-10-CM | POA: Diagnosis not present

## 2018-07-21 ENCOUNTER — Ambulatory Visit (HOSPITAL_COMMUNITY)
Admission: RE | Admit: 2018-07-21 | Discharge: 2018-07-21 | Disposition: A | Discharge status: Home / Self Care | Payer: Medicare HMO | Source: Ambulatory Visit | Attending: Hematology | Admitting: Hematology

## 2018-07-21 ENCOUNTER — Telehealth: Payer: Self-pay | Admitting: Hematology

## 2018-07-21 ENCOUNTER — Other Ambulatory Visit: Payer: Self-pay

## 2018-07-21 DIAGNOSIS — D472 Monoclonal gammopathy: Secondary | ICD-10-CM

## 2018-07-21 NOTE — Progress Notes (Signed)
HEMATOLOGY/ONCOLOGY CONSULTATION NOTE  Date of Service: 07/22/2018  Patient Care Team: Deland Pretty, MD as PCP - General (Internal Medicine) Adrian Prows, MD as Consulting Physician (Cardiology) Jacelyn Pi, MD as Consulting Physician (Endocrinology)  CHIEF COMPLAINTS/PURPOSE OF CONSULTATION:  Monoclonal Paraproteinemia  HISTORY OF PRESENTING ILLNESS:   [name] is a wonderful 73 y.o. female who has been referred to [place] by Dr. Jacelyn Pi for evaluation and management of Monoclonal Paraproteinemia. The pt reports that she is doing well overall.   The pt reports that she was seen to have high calcium levels with her endocrinologist, as noted below, and was evaluated further with an SPEP which revealed 0.5g of monoclonal protein, for which she has been referred to me. The pt has CKD which she notes is thought to be related to her DM, which she notes has been recently well controlled. The pt notes that she recently lifted a reclining chair and hurt her hip after doing this, which she notes has resoled. She denies any other new or acute bone or joint pains.   The pt notes that she has been feeling tired lately, but denies any acute changes in her energy levels. She adds that she has CHF and has experienced ease of SOB when walking. She takes Torsemide as needed. She notes that she is the primary and sole caregiver of her disabled husband. She relates her tiredness to these two aspects.   The pt notes that she has a pacemaker and that her heart symptoms have been stable. She has not been able to have a Cardiac MRI due to her pacemaker.  Most recent lab results (06/09/18) of CBC w/diff and CMP is as follows: all values are WNL except for BUN at 27, Creatinine at 1.5, GFR at 37.38, Calcium at 10.6. 06/10/18 SPEP revealed M Protein at 0.5g  On review of systems, pt reports some SOB when walking, feeling tired, staying active, eating well, and denies new bone pains, new joint pains,  concerns for infections, leg swelling, abdominal pains, and any other symptoms.   On PMHx the pt reports CKD, DM, CHF. On Social Hx the pt reports that she is a primary caregiver to her disabled husband.  Interval History:   I connected with [name] on 07/22/18 at  3:00 PM EDT by telephone and verified that I am speaking with the correct person using two identifiers.  I discussed the limitations, risks, security and privacy concerns of performing an evaluation and management service by telemedicine and the availability of in-person appointments. I also discussed with the patient that there may be a patient responsible charge related to this service. The patient expressed understanding and agreed to proceed.  Other persons participating in the visit and their role in the encounter:  - Solicitor, Medical Scribe  Patient's location: Home Provider's location: my office at the [facility] is called today for management and evaluation of her Monoclonal paraproteinemia. The patient's last visit with [place] was on 06/30/18. The pt reports that she is doing well overall.  The pt reports that she is well overall.   Of note since the patient's last visit, pt has had a Bone Survey completed on 07/21/18 with results revealing "No evidence of skeletal metastatic disease multiple myeloma or other neoplastic disease of bone."  Lab results (06/30/18) of CBC w/diff and CMP is as follows: all values are WNL except for Glucose at 119, Creatinine at 1.29, Alk Phos  at 29, GFR at 40. 06/30/18 LDH at 188, Beta-2 microglobulin at 2.9, Sed Rate at 1 06/30/18 MMP revealed all values WNL 06/30/18 SFLC revealed Kappa at 32.2 and a K:L ratio of 2.15  On review of systems, pt denies any new symptoms.    MEDICAL HISTORY:  Past Medical History:  Diagnosis Date  . Arthritis   . Diabetes mellitus without complication ([place])   . History of chickenpox   . Hx of psoriatic arthritis   .  Hyperlipemia   . Hypertension   . Hypertension 05/03/2018  . Mitral valve disorders(424.0)   . Obstructive sleep apnea   . Pacemaker   . Peripheral neuropathy   . Renal disorder Kidney disease stage2  . Thyroid disease hypothyroid  . Urine incontinence   . Vitamin D deficiency     SURGICAL HISTORY: Past Surgical History:  Procedure Laterality Date  . CARDIAC CATHETERIZATION N/A 04/29/2015   Procedure: Temporary Pacemaker;  Surgeon: Adrian Prows, MD;  Location: [facility];  Service: Cardiovascular;  Laterality: N/A;  . EP IMPLANTABLE DEVICE N/A 04/30/2015   Procedure: Pacemaker Implant;  Surgeon: Evans Lance, MD;  Location: [facility];  Service: Cardiovascular;  Laterality: N/A;  . INTRAVASCULAR PRESSURE WIRE/FFR STUDY N/A 08/31/2017   Procedure: INTRAVASCULAR PRESSURE WIRE/FFR STUDY;  Surgeon: Nigel Mormon, MD;  Location: [facility];  Service: Cardiovascular;  Laterality: N/A;  . LEFT AND RIGHT HEART CATHETERIZATION WITH CORONARY ANGIOGRAM N/A 09/30/2011   Procedure: LEFT AND RIGHT HEART CATHETERIZATION WITH CORONARY ANGIOGRAM;  Surgeon: Laverda Page, MD;  Location: [facility];  Service: Cardiovascular;  Laterality: N/A;  . RIGHT/LEFT HEART CATH AND CORONARY ANGIOGRAPHY N/A 08/31/2017   Procedure: RIGHT/LEFT HEART CATH AND CORONARY ANGIOGRAPHY;  Surgeon: Nigel Mormon, MD;  Location: [facility];  Service: Cardiovascular;  Laterality: N/A;  . YAG LASER APPLICATION Right 07/13/1599   Procedure: YAG LASER APPLICATION;  Surgeon: Elta Guadeloupe T. Gershon Crane, MD;  Location: [facility];  Service: Ophthalmology;  Laterality: Right;  pt knows to arrive at 11:15  . YAG LASER APPLICATION Left 0/09/3233   Procedure: YAG LASER APPLICATION;  Surgeon: Rutherford Guys, MD;  Location: [facility];  Service: Ophthalmology;  Laterality: Left;    SOCIAL HISTORY: Social History   Socioeconomic History  . Marital status: Married    Spouse name: Not on file  . Number of children:  3  . Years of education: Not on file  . Highest education level: Not on file  Occupational History  . Not on file  Social Needs  . Financial resource strain: Not on file  . Food insecurity    Worry: Not on file    Inability: Not on file  . Transportation needs    Medical: Not on file    Non-medical: Not on file  Tobacco Use  . Smoking status: Never Smoker  . Smokeless tobacco: Never Used  Substance and Sexual Activity  . Alcohol use: No    Alcohol/week: 0.0 standard drinks  . Drug use: No  . Sexual activity: Not Currently  Lifestyle  . Physical activity    Days per week: Not on file    Minutes per session: Not on file  . Stress: Not on file  Relationships  . Social Herbalist on phone: Not on file    Gets together: Not on file    Attends religious service: Not on file    Active member of club or organization: Not  on file    Attends meetings of clubs or organizations: Not on file    Relationship status: Not on file  . Intimate partner violence    Fear of current or ex partner: Not on file    Emotionally abused: Not on file    Physically abused: Not on file    Forced sexual activity: Not on file  Other Topics Concern  . Not on file  Social History Narrative  . Not on file    FAMILY HISTORY: Family History  Problem Relation Age of Onset  . Arthritis Mother   . Diabetes Mother   . Heart disease Mother   . Hyperlipidemia Mother   . Hypertension Mother   . Arthritis Father   . Asthma Father   . Heart attack Father   . Hyperlipidemia Father   . Hypertension Father   . Stroke Father   . Arthritis Sister   . Diabetes Sister   . Hypertension Sister   . Arthritis Brother   . Heart attack Brother   . Heart disease Brother   . Hyperlipidemia Brother   . Hypertension Brother   . Alcohol abuse Brother   . Arthritis Brother   . Diabetes Brother   . Early death Brother   . Heart disease Brother   . Hyperlipidemia Brother   . Hypertension Brother   .  Arthritis Sister   . Cancer Sister   . Diabetes Sister   . Hyperlipidemia Sister   . Hypertension Sister   . Stroke Sister   . Cancer Sister   . Hyperlipidemia Sister   . Hypertension Sister   . Hypertension Daughter     ALLERGIES:  is allergic to hydrocodone; invokana [canagliflozin]; and oxycodone.  MEDICATIONS:  Current Outpatient Medications  Medication Sig Dispense Refill  . amiodarone (PACERONE) 200 MG tablet Take 100 mg by mouth daily.    Marland Kitchen apixaban (ELIQUIS) 5 MG TABS tablet Take 1 tablet (5 mg total) by mouth 2 (two) times daily. (Patient taking differently: Take 5 mg by mouth See admin instructions. Take 5 mg by mouth two times a day- before breakfast and before supper) 60 tablet 3  . cholecalciferol (VITAMIN D) 25 MCG (1000 UT) tablet Take 1,000 Units by mouth 2 (two) times daily.    . Cholecalciferol (VITAMIN D-3) 1000 units CAPS Take 2,000 Units by mouth daily.    . fenofibrate 160 MG tablet Take 160 mg by mouth daily with supper.     . insulin aspart protamine- aspart (NOVOLOG MIX 70/30) (70-30) 100 UNIT/ML injection Inject 20-36 Units into the skin See admin instructions. Inject 65  units into the skin with breakfast and 36 units with supper (patient reported 06/30/2018)    . isosorbide mononitrate (IMDUR) 30 MG 24 hr tablet Take 1 tablet by mouth once daily 90 tablet 0  . levothyroxine (SYNTHROID, LEVOTHROID) 50 MCG tablet Take 50 mcg by mouth daily before breakfast.    . metoprolol tartrate (LOPRESSOR) 50 MG tablet Take 1 tablet by mouth twice daily 180 tablet 1  . nitroGLYCERIN (NITROSTAT) 0.4 MG SL tablet Place 1 tablet (0.4 mg total) under the tongue every 5 (five) minutes as needed for chest pain. 25 tablet 1  . nystatin cream (MYCOSTATIN) Apply 1 application topically 2 (two) times daily as needed (for irritation of affected area). 30 g 1  . olmesartan-hydrochlorothiazide (BENICAR HCT) 40-25 MG tablet Take 1 tablet by mouth daily.    Marland Kitchen oxybutynin (DITROPAN-XL) 5 MG  24 hr tablet TAKE  1 TABLET BY MOUTH AT BEDTIME 30 tablet 3  . simvastatin (ZOCOR) 20 MG tablet TAKE 1 TABLET BY MOUTH IN THE EVENING AFTER DINNER - DISCONTINUE PRAVASTATIN - MYALGIA 90 tablet 1  . spironolactone (ALDACTONE) 25 MG tablet Take 25 mg by mouth daily.    Marland Kitchen torsemide (DEMADEX) 20 MG tablet Take 1 tablet (20 mg total) by mouth as needed. As needed for leg edema 30 tablet 3  . triamcinolone ointment (KENALOG) 0.5 % Apply 1 application topically 2 (two) times daily as needed (for itching). 30 g 1  . vitamin B-12 (CYANOCOBALAMIN) 500 MCG tablet Take 500 mcg by mouth 2 (two) times daily.     No current facility-administered medications for this visit.     REVIEW OF SYSTEMS:    A 10+ POINT REVIEW OF SYSTEMS WAS OBTAINED including neurology, dermatology, psychiatry, cardiac, respiratory, lymph, extremities, GI, GU, Musculoskeletal, constitutional, breasts, reproductive, HEENT.  All pertinent positives are noted in the HPI.  All others are negative.   PHYSICAL EXAMINATION:  There were no vitals filed for this visit. There were no vitals filed for this visit. .There is no height or weight on file to calculate BMI.  Phone visit   LABORATORY DATA:  I have reviewed the data as listed  . CBC Latest Ref Rng & Units 06/30/2018 10/24/2017 09/01/2017  WBC 4.0 - 10.5 K/uL 6.3 8.2 7.9  Hemoglobin 12.0 - 15.0 g/dL 13.7 12.1 14.4  Hematocrit 36.0 - 46.0 % 43.6 38.6 45.1  Platelets 150 - 400 K/uL 152 128(L) 135(L)    . CMP Latest Ref Rng & Units 06/30/2018 10/24/2017 09/01/2017  Glucose 70 - 99 mg/dL 119(H) 158(H) 114(H)  BUN 8 - 23 mg/dL 19 16 29(H)  Creatinine 0.44 - 1.00 mg/dL 1.29(H) 1.37(H) 1.27(H)  Sodium 135 - 145 mmol/L 140 135 135  Potassium 3.5 - 5.1 mmol/L 4.2 4.1 4.4  Chloride 98 - 111 mmol/L 104 98 101  CO2 22 - 32 mmol/L _0 Calcium 8.9 - 10.3 mg/dL 10.1 9.6 9.2  Total Protein 6.5 - 8.1 g/dL 7.6 - -  Total Bilirubin 0.3 - 1.2 mg/dL 0.3 - -  Alkaline Phos 38 - 126 U/L  29(L) - -  AST 15 - 41 U/L 25 - -  ALT 0 - 44 U/L 23 - -    06/10/18 SPEP:    06/09/18 CBC w/diff and CMP:       RADIOGRAPHIC STUDIES: I have personally reviewed the radiological images as listed and agreed with the findings in the report. Dg Bone Survey Met  Result Date: 07/21/2018 CLINICAL DATA:  Pt labwork positive for monoclonal paraproteineia, increased calcium, ? Infection versus metastasis, pt has generalized pain, especially in low back EXAM: METASTATIC BONE SURVEY COMPARISON:  None. FINDINGS: There are no osteoblastic or osteolytic lesions. No evidence of multiple myeloma, metastatic disease or other neoplastic disease to bone. There mild degenerative changes along the thoracolumbar spine, most evident at L5-S1. Dense aortic atherosclerotic calcifications are noted extending into the iliac and femoral vessels. IMPRESSION: 1. No evidence of skeletal metastatic disease multiple myeloma or other neoplastic disease of bone. Electronically Signed   By: Lajean Manes M.D.   On: 07/21/2018 15:57    ASSESSMENT & PLAN:  73 y.o. female with  1. Monoclonal paraproteinemia Labs upon initial presentation from 06/09/18 CBC w/diff revealed normal WBC at 6.2k, normal HGB at 14.3, and normal PLT at 157k. 06/09/18 CMP revealed Creatinine at 1.5 and Calcium at 10.6. Alk Phos  normal. Ionized calcium normal. 06/10/18 SPEP revealed M Protein at 0.5g 06/08/17 ECHO revealed LV EF of 65-70%  PLAN: -Discussed pt labwork from 06/30/18; blood counts normal, chemistries stable. No M spike observed. LDH and Sed Rate are normal, Beta 2 microglobulin at 2.9. Kappa light chains at 32.2, K:L ratio of 2.15.  -Discussed the 07/05/18 24hr UPEP which revealed 359m of total protein per day and no other abnormal findings -Discussed the 07/21/18 Bone Survey which revealed "No evidence of skeletal metastatic disease multiple myeloma or other neoplastic disease of bone." -Discussed the CRAB criteria with the pt: pt does have  higher calcium levels, but also high Vitamin D. Her renal function is otherwise explained with existing CKD thought to be related to her DM. Pt does not have anemia. No overt concern for bone tumors. -No current, overt indication for a bone marrow biopsy -Cardiology could consider Technetium study or fat pad biopsy to rule out AL Amyloidosis, if clinical suspicion warrants this. -There are currently no concerns for this to be a plasma cell problem, that this was a reactive process.  -There is no need for a follow up at this time.    No orders of the defined types were placed in this encounter.   All of the patients questions were answered with apparent satisfaction. The patient knows to call the clinic with any problems, questions or concerns.  The total time spent in the appt was 15 minutes and more than 50% was on counseling and direct patient cares.    GSullivan LoneMD MS AAHIVMS SGreater Gaston [facility] Center LLCCParkridge Medical CenterHematology/Oncology Physician [facility]Qu[facility] (Office):       [phone](Work cell):  [phone](Fax):           [phone] 07/22/2018 4:18 PM  I, SBaldwin Jamaica am acting as a scribe for Dr. GSullivan Lone   .I have reviewed the above documentation for accuracy and completeness, and I agree with the above. .Brunetta GeneraMD

## 2018-07-22 ENCOUNTER — Inpatient Hospital Stay: Payer: Medicare HMO | Attending: Hematology | Admitting: Hematology

## 2018-07-22 DIAGNOSIS — R0609 Other forms of dyspnea: Secondary | ICD-10-CM | POA: Diagnosis not present

## 2018-07-22 DIAGNOSIS — Z794 Long term (current) use of insulin: Secondary | ICD-10-CM | POA: Diagnosis not present

## 2018-07-22 DIAGNOSIS — Z79899 Other long term (current) drug therapy: Secondary | ICD-10-CM

## 2018-07-22 DIAGNOSIS — I509 Heart failure, unspecified: Secondary | ICD-10-CM | POA: Insufficient documentation

## 2018-07-22 DIAGNOSIS — G4733 Obstructive sleep apnea (adult) (pediatric): Secondary | ICD-10-CM | POA: Diagnosis not present

## 2018-07-22 DIAGNOSIS — D472 Monoclonal gammopathy: Secondary | ICD-10-CM | POA: Diagnosis not present

## 2018-07-22 DIAGNOSIS — N189 Chronic kidney disease, unspecified: Secondary | ICD-10-CM | POA: Insufficient documentation

## 2018-07-22 DIAGNOSIS — Z950 Presence of cardiac pacemaker: Secondary | ICD-10-CM

## 2018-07-22 DIAGNOSIS — E785 Hyperlipidemia, unspecified: Secondary | ICD-10-CM | POA: Insufficient documentation

## 2018-07-22 DIAGNOSIS — E1122 Type 2 diabetes mellitus with diabetic chronic kidney disease: Secondary | ICD-10-CM | POA: Insufficient documentation

## 2018-07-22 DIAGNOSIS — I130 Hypertensive heart and chronic kidney disease with heart failure and stage 1 through stage 4 chronic kidney disease, or unspecified chronic kidney disease: Secondary | ICD-10-CM | POA: Insufficient documentation

## 2018-07-22 DIAGNOSIS — Z7901 Long term (current) use of anticoagulants: Secondary | ICD-10-CM | POA: Diagnosis not present

## 2018-07-23 ENCOUNTER — Telehealth: Payer: Self-pay | Admitting: Hematology

## 2018-07-23 NOTE — Telephone Encounter (Signed)
No los per 7/9. °

## 2018-07-26 ENCOUNTER — Other Ambulatory Visit: Payer: Self-pay | Admitting: Cardiology

## 2018-07-28 DIAGNOSIS — G4733 Obstructive sleep apnea (adult) (pediatric): Secondary | ICD-10-CM | POA: Diagnosis not present

## 2018-08-02 DIAGNOSIS — R069 Unspecified abnormalities of breathing: Secondary | ICD-10-CM | POA: Diagnosis not present

## 2018-08-02 DIAGNOSIS — I5033 Acute on chronic diastolic (congestive) heart failure: Secondary | ICD-10-CM | POA: Diagnosis not present

## 2018-08-10 ENCOUNTER — Telehealth: Payer: Self-pay

## 2018-08-10 NOTE — Telephone Encounter (Signed)
The Patient called and asked if her medicine could be making her feel so tired and no energy ?

## 2018-08-12 NOTE — Telephone Encounter (Signed)
Could be but she had had multiple medical problems. We can consider pulmonary rehab for exercise

## 2018-08-13 NOTE — Telephone Encounter (Signed)
PT HAS APPT 8/10 WITH [facility]. SHE DECLINED REHAB AT THIS TIME

## 2018-08-16 ENCOUNTER — Encounter: Payer: Self-pay | Admitting: Internal Medicine

## 2018-08-20 ENCOUNTER — Ambulatory Visit: Payer: Medicare HMO | Admitting: Cardiology

## 2018-08-20 DIAGNOSIS — G4733 Obstructive sleep apnea (adult) (pediatric): Secondary | ICD-10-CM | POA: Diagnosis not present

## 2018-08-23 ENCOUNTER — Ambulatory Visit: Payer: Medicare HMO | Admitting: Cardiology

## 2018-08-23 DIAGNOSIS — Z794 Long term (current) use of insulin: Secondary | ICD-10-CM | POA: Diagnosis not present

## 2018-08-23 DIAGNOSIS — E119 Type 2 diabetes mellitus without complications: Secondary | ICD-10-CM | POA: Diagnosis not present

## 2018-08-26 ENCOUNTER — Ambulatory Visit: Payer: Self-pay

## 2018-08-30 ENCOUNTER — Ambulatory Visit
Admission: RE | Admit: 2018-08-30 | Discharge: 2018-08-30 | Disposition: A | Discharge status: Home / Self Care | Payer: Medicare HMO | Source: Ambulatory Visit | Attending: Cardiology | Admitting: Cardiology

## 2018-08-30 ENCOUNTER — Ambulatory Visit: Payer: Self-pay

## 2018-08-30 ENCOUNTER — Encounter: Payer: Self-pay | Admitting: Cardiology

## 2018-08-30 ENCOUNTER — Other Ambulatory Visit: Payer: Self-pay

## 2018-08-30 ENCOUNTER — Ambulatory Visit (INDEPENDENT_AMBULATORY_CARE_PROVIDER_SITE_OTHER): Payer: Medicare HMO | Admitting: Cardiology

## 2018-08-30 VITALS — BP 104/53 | HR 109 | Ht 62.0 in | Wt 202.3 lb

## 2018-08-30 DIAGNOSIS — R0602 Shortness of breath: Secondary | ICD-10-CM | POA: Diagnosis not present

## 2018-08-30 DIAGNOSIS — I484 Atypical atrial flutter: Secondary | ICD-10-CM

## 2018-08-30 DIAGNOSIS — Z45018 Encounter for adjustment and management of other part of cardiac pacemaker: Secondary | ICD-10-CM | POA: Diagnosis not present

## 2018-08-30 DIAGNOSIS — Z950 Presence of cardiac pacemaker: Secondary | ICD-10-CM

## 2018-08-30 DIAGNOSIS — I495 Sick sinus syndrome: Secondary | ICD-10-CM | POA: Diagnosis not present

## 2018-08-30 DIAGNOSIS — I2510 Atherosclerotic heart disease of native coronary artery without angina pectoris: Secondary | ICD-10-CM | POA: Diagnosis not present

## 2018-08-30 DIAGNOSIS — I480 Paroxysmal atrial fibrillation: Secondary | ICD-10-CM

## 2018-08-30 DIAGNOSIS — R0989 Other specified symptoms and signs involving the circulatory and respiratory systems: Secondary | ICD-10-CM | POA: Diagnosis not present

## 2018-08-30 MED ORDER — VERAPAMIL HCL ER 120 MG PO TBCR: 120.0000 mg | EXTENDED_RELEASE_TABLET | Freq: Every day | ORAL | 1 refills | Status: DC

## 2018-08-30 MED ORDER — AMIODARONE HCL 200 MG PO TABS: 200.0000 mg | ORAL_TABLET | Freq: Every day | ORAL | 1 refills | Status: DC

## 2018-08-30 NOTE — H&P (View-Only) (Signed)
Primary Physician:  Deland Pretty, MD   Patient ID: [name], female    DOB: [birth year], 73 y.o.   MRN: [id number]  Subjective:    Chief Complaint  Patient presents with  . atrial tachycardia  . Pacemaker Check    1 yr    HPI: LATORIA DRY  is a 73 y.o. female  with hypertension, type 2 diabetes mellitus, nonobstructive CAD (Cath 2019), sick sinus syndrome status post dual-chamber pacemaker 2017, hyperlipidemia, stage III chronic kidney disease, OSA, paroxysmal Afib, paroxysmal atrial tachycardia.  Patient was last seen in April virtually, to reduce risk of long-term side effects from amiodarone, this was reduced to 100 mg.  At that time she was doing well without any shortness of breath or leg edema.  A few weeks ago she noticed some heart racing episodes that would improve with sitting down and resting.  A few days ago she called our office complaining of tiredness and weakness, but states she is feeling some better today.  Has had worsening shortness of breath and occasional leg edema that improves with use of torsemide.  Overall, denies any chest pain, orthopnea, PND, presncope, syncope, TIA.     Past Medical History:  Diagnosis Date  . Arthritis   . Diabetes mellitus without complication ([place])   . History of chickenpox   . Hx of psoriatic arthritis   . Hyperlipemia   . Hypertension   . Hypertension 05/03/2018  . Mitral valve disorders(424.0)   . Obstructive sleep apnea   . Pacemaker   . Peripheral neuropathy   . Renal disorder Kidney disease stage2  . Thyroid disease hypothyroid  . Urine incontinence   . Vitamin D deficiency     Past Surgical History:  Procedure Laterality Date  . CARDIAC CATHETERIZATION N/A 04/29/2015   Procedure: Temporary Pacemaker;  Surgeon: Adrian Prows, MD;  Location: [facility];  Service: Cardiovascular;  Laterality: N/A;  . EP IMPLANTABLE DEVICE N/A 04/30/2015   Procedure: Pacemaker Implant;  Surgeon: Evans Lance, MD;   Location: [facility];  Service: Cardiovascular;  Laterality: N/A;  . INTRAVASCULAR PRESSURE WIRE/FFR STUDY N/A 08/31/2017   Procedure: INTRAVASCULAR PRESSURE WIRE/FFR STUDY;  Surgeon: Nigel Mormon, MD;  Location: [facility];  Service: Cardiovascular;  Laterality: N/A;  . LEFT AND RIGHT HEART CATHETERIZATION WITH CORONARY ANGIOGRAM N/A 09/30/2011   Procedure: LEFT AND RIGHT HEART CATHETERIZATION WITH CORONARY ANGIOGRAM;  Surgeon: Laverda Page, MD;  Location: [facility];  Service: Cardiovascular;  Laterality: N/A;  . RIGHT/LEFT HEART CATH AND CORONARY ANGIOGRAPHY N/A 08/31/2017   Procedure: RIGHT/LEFT HEART CATH AND CORONARY ANGIOGRAPHY;  Surgeon: Nigel Mormon, MD;  Location: [facility];  Service: Cardiovascular;  Laterality: N/A;  . YAG LASER APPLICATION Right 4/96/7591   Procedure: YAG LASER APPLICATION;  Surgeon: Elta Guadeloupe T. Gershon Crane, MD;  Location: [facility];  Service: Ophthalmology;  Laterality: Right;  pt knows to arrive at 11:15  . YAG LASER APPLICATION Left 06/15/8464   Procedure: YAG LASER APPLICATION;  Surgeon: Rutherford Guys, MD;  Location: [facility];  Service: Ophthalmology;  Laterality: Left;    Social History   Socioeconomic History  . Marital status: Married    Spouse name: Not on file  . Number of children: 3  . Years of education: Not on file  . Highest education level: Not on file  Occupational History  . Not on file  Social Needs  . Financial resource strain: Not on file  .  Food insecurity    Worry: Not on file    Inability: Not on file  . Transportation needs    Medical: Not on file    Non-medical: Not on file  Tobacco Use  . Smoking status: Never Smoker  . Smokeless tobacco: Never Used  Substance and Sexual Activity  . Alcohol use: No    Alcohol/week: 0.0 standard drinks  . Drug use: No  . Sexual activity: Not Currently  Lifestyle  . Physical activity    Days per week: Not on file    Minutes per session: Not on file  . Stress:  Not on file  Relationships  . Social Herbalist on phone: Not on file    Gets together: Not on file    Attends religious service: Not on file    Active member of club or organization: Not on file    Attends meetings of clubs or organizations: Not on file    Relationship status: Not on file  . Intimate partner violence    Fear of current or ex partner: Not on file    Emotionally abused: Not on file    Physically abused: Not on file    Forced sexual activity: Not on file  Other Topics Concern  . Not on file  Social History Narrative  . Not on file    Review of Systems  Constitution: Negative for chills, decreased appetite, malaise/fatigue and weight gain.  Cardiovascular: Positive for dyspnea on exertion (stable). Negative for chest pain, claudication, leg swelling, orthopnea, palpitations and syncope.  Respiratory: Negative for cough, hemoptysis and wheezing.   Endocrine: Negative for cold intolerance.  Hematologic/Lymphatic: Does not bruise/bleed easily.  Musculoskeletal: Negative for arthritis and joint swelling.  Gastrointestinal: Negative for abdominal pain, anorexia and change in bowel habit.  Neurological: Negative for headaches and light-headedness.  Psychiatric/Behavioral: Negative for depression and substance abuse.  All other systems reviewed and are negative.     Objective:  Blood pressure (!) 104/53, pulse (!) 109, height 5\' 2"  (1.575 m), weight 202 lb 4.8 oz (91.8 kg), SpO2 94 %. Body mass index is 37 kg/m.  Physical Exam  Constitutional: She appears well-developed and well-nourished. No distress.  HENT:  Head: Atraumatic.  Eyes: Conjunctivae are normal.  Neck: Neck supple. No JVD present. No thyromegaly present.  Cardiovascular: Regular rhythm, S1 normal, S2 normal and intact distal pulses. Tachycardia present. Exam reveals no gallop.  Murmur heard. High-pitched blowing holosystolic murmur is present with a grade of 2/6 at the apex. Pulses:       Carotid pulses are 2+ on the right side and 2+ on the left side with bruit.      Popliteal pulses are 1+ on the right side and 1+ on the left side.       Dorsalis pedis pulses are 1+ on the right side and 1+ on the left side.       Posterior tibial pulses are 1+ on the right side and 1+ on the left side.  II/VI SEM in the right parasternal border  Pulmonary/Chest: Effort normal and breath sounds normal.  Left infraclavicular pacemaker pocket noted  Abdominal: Soft. Bowel sounds are normal.  Musculoskeletal: Normal range of motion.        General: No edema.  Neurological: She is alert.  Skin: Skin is warm and dry.  Psychiatric: She has a normal mood and affect.   Radiology: Dg Chest 2 View  Result Date: 08/30/2018 CLINICAL DATA:  SOB, non smoker,  pacemaker EXAM: CHEST - 2 VIEW COMPARISON:  Chest radiographs 10/24/2017, 06/07/2017 FINDINGS: Stable cardiomediastinal contours with enlarged heart size. Dual lead left chest pacemaker with tip terminating in the right atrium and right ventricle. There is central venous congestion without overt edema. No new focal infiltrate. No pneumothorax or pleural effusion. Degenerative changes are seen in the thoracic spine. IMPRESSION: Cardiomegaly with central venous congestion, no overt edema. Electronically Signed   By: Audie Pinto M.D.   On: 08/30/2018 17:04    Laboratory examination:    CMP Latest Ref Rng & Units 06/30/2018 10/24/2017 09/01/2017  Glucose 70 - 99 mg/dL 119(H) 158(H) 114(H)  BUN 8 - 23 mg/dL 19 16 29(H)  Creatinine 0.44 - 1.00 mg/dL 1.29(H) 1.37(H) 1.27(H)  Sodium 135 - 145 mmol/L 140 135 135  Potassium 3.5 - 5.1 mmol/L 4.2 4.1 4.4  Chloride 98 - 111 mmol/L 104 98 101  CO2 22 - 32 mmol/L 26 27 26   Calcium 8.9 - 10.3 mg/dL 10.1 9.6 9.2  Total Protein 6.5 - 8.1 g/dL 7.6 - -  Total Bilirubin 0.3 - 1.2 mg/dL 0.3 - -  Alkaline Phos 38 - 126 U/L 29(L) - -  AST 15 - 41 U/L 25 - -  ALT 0 - 44 U/L 23 - -   CBC Latest Ref Rng &  Units 06/30/2018 10/24/2017 09/01/2017  WBC 4.0 - 10.5 K/uL 6.3 8.2 7.9  Hemoglobin 12.0 - 15.0 g/dL 13.7 12.1 14.4  Hematocrit 36.0 - 46.0 % 43.6 38.6 45.1  Platelets 150 - 400 K/uL 152 128(L) 135(L)   Lipid Panel     Component Value Date/Time   CHOL 168 08/29/2017 0328   TRIG 544 (H) 08/29/2017 0328   HDL 19 (L) 08/29/2017 0328   CHOLHDL 8.8 08/29/2017 0328   VLDL UNABLE TO CALCULATE IF TRIGLYCERIDE OVER 400 mg/dL 08/29/2017 0328   LDLCALC UNABLE TO CALCULATE IF TRIGLYCERIDE OVER 400 mg/dL 08/29/2017 0328   HEMOGLOBIN A1C Lab Results  Component Value Date   HGBA1C 7.3 (H) 09/30/2015   MPG 163 09/30/2015   TSH No results for input(s): TSH in the last 8760 hours.  PRN Meds:. Medications Discontinued During This Encounter  Medication Reason  . amiodarone (PACERONE) 200 MG tablet Dose change  . isosorbide mononitrate (IMDUR) 30 MG 24 hr tablet Discontinued by provider   Current Meds  Medication Sig  . apixaban (ELIQUIS) 5 MG TABS tablet Take 1 tablet (5 mg total) by mouth 2 (two) times daily. (Patient taking differently: Take 5 mg by mouth See admin instructions. Take 5 mg by mouth two times a day- before breakfast and before supper)  . cholecalciferol (VITAMIN D) 25 MCG (1000 UT) tablet Take 1,000 Units by mouth 2 (two) times daily.  . Cholecalciferol (VITAMIN D-3) 1000 units CAPS Take 2,000 Units by mouth daily.  . fenofibrate 160 MG tablet Take 160 mg by mouth daily with supper.   . insulin aspart protamine- aspart (NOVOLOG MIX 70/30) (70-30) 100 UNIT/ML injection Inject 20-36 Units into the skin See admin instructions. Inject 70units into the skin with breakfast and 35 units with supper (patient reported 06/30/2018)  . levothyroxine (SYNTHROID, LEVOTHROID) 50 MCG tablet Take 50 mcg by mouth daily before breakfast.  . metoprolol tartrate (LOPRESSOR) 50 MG tablet Take 1 tablet by mouth twice daily  . nitroGLYCERIN (NITROSTAT) 0.4 MG SL tablet Place 1 tablet (0.4 mg total) under  the tongue every 5 (five) minutes as needed for chest pain.  Marland Kitchen nystatin cream (MYCOSTATIN) Apply 1  application topically 2 (two) times daily as needed (for irritation of affected area).  . olmesartan-hydrochlorothiazide (BENICAR HCT) 40-25 MG tablet Take 1 tablet by mouth daily.  Marland Kitchen oxybutynin (DITROPAN-XL) 5 MG 24 hr tablet TAKE 1 TABLET BY MOUTH AT BEDTIME  . simvastatin (ZOCOR) 20 MG tablet TAKE 1 TABLET BY MOUTH IN THE EVENING AFTER DINNER - DISCONTINUE PRAVASTATIN - MYALGIA  . spironolactone (ALDACTONE) 25 MG tablet TAKE 1 TABLET BY MOUTH IN THE MORNING  . torsemide (DEMADEX) 20 MG tablet Take 1 tablet (20 mg total) by mouth as needed. As needed for leg edema  . triamcinolone ointment (KENALOG) 0.5 % Apply 1 application topically 2 (two) times daily as needed (for itching).  . vitamin B-12 (CYANOCOBALAMIN) 500 MCG tablet Take 500 mcg by mouth 2 (two) times daily.  . [DISCONTINUED] amiodarone (PACERONE) 200 MG tablet Take 100 mg by mouth daily.  . [DISCONTINUED] isosorbide mononitrate (IMDUR) 30 MG 24 hr tablet Take 1 tablet by mouth once daily    Cardiac Studies:   Carotid Doppler [02/10/2014]: No evidence of hemodynamically significant stenosis in the bilateral carotid bifurcation vessels. There is mild evidence of homogeneous plaque in bilateral carotid arteries. No significant change from 10/13/2011.  Echocardiogram 07/01/2017: Left ventricle cavity is normal in size. Moderate concentric hypertrophy of the left ventricle. Normal global wall motion. Doppler evidence of grade III (restrictive) diastolic dysfunction. Diastolic dysfunction findings suggests elevated LA/LV end diastolic pressure. Calculated EF 60%. Left atrial cavity is moderately dilated at 4.6cm. Moderate (Grade II) mitral regurgitation. Mild tricuspid regurgitation. Mild pulmonary hypertension. Estimated pulmonary artery systolic pressure 36 mmHg IVC is normal with blunted respiratory response, suggests elevated central  venous pressure.  Lexiscan myoview stress test 06/15/2017: 1. Lexiscan stress test was performed. Exercise capacity was not assessed. Stress symptoms included dizziness. Peak blood pressure 158/66 mmHg. Stress EKG is non diagnostic for ischemia as it is a pharmacologic stress. In addition, it demonstrated atrial pacing and incomplete LBBB. 2. The overall quality of the study is excellent. There is no evidence of abnormal lung activity. Stress and rest SPECT images demonstrate homogeneous tracer distribution throughout the myocardium. Gated SPECT imaging reveals normal myocardial thickening and wall motion. The left ventricular ejection fraction was normal (71%). 3. Low risk study.  Sleep Study [08/11/2017]: Positive for Complex Sleep Apnea; On CPAP   R&LHC 08/31/2017: LCx: Nondominant. AV grove LCx 50-60% diffuse disease. RCA: Large dominant. Ostial PDA 50% stenosis, FFR 0.96. No change from 09/30/2011.  RA: 5 mmHg RV: 45/4 mmHg. PA: 40/12 mmHg. Mean PA 29 mmHg PCWP: 16 mmHg LV: 130/3 mmHg, LVEDP 13 mmHg  CO: 3.6 L/min. CI 1.9 L/min/m2  Assessment:     ICD-10-CM   1. Encounter for interrogation of cardiac pacemaker  Z45.018   2. Sick sinus syndrome (HCC)  I49.5   3. Cardiac pacemaker in situ on 04/30/2015 [employer] Adapta Dr Peggyann Juba; Leads only MRI compatible: Sinus node dysfunction  Z95.0   4. Atypical atrial flutter (HCC)  I48.4   5. Paroxysmal atrial fibrillation (HCC) CHA2DS2-VASc Score is 6 with yearly risk of stroke of 9.8%.   I48.0 EKG 12-Lead  6. Coronary artery disease involving native coronary artery of native heart without angina pectoris  I25.10   7. Shortness of breath  R06.02 DG Chest 2 View    EKG 08/30/2018: Atypical atrial flutter with 3:1 conduction, V rate 110 bpm, left axis deviation. LBBB.  Remote  Pacemaker transmission 03/05/2018: No mode switches. No VHR episodes. Battery longevity is 8-11.5 years. RA  pacing is 99.9 %, RV pacing is 16 %Remote pacemaker  check  06/11/18: There were 832 atrial high rate episodes detected. The longest lasted 0:00:12:10 in duration. There was a 0.2 % cumulative atrial arrhythmia burden. Mostly markers seen suggesting false AF. There were 0 high ventricular rate episodes detected. Health trends do not demonstrate significant abnormality. Battery longevity is 8.5 years. RA pacing is 85.3 %, RV pacing is 13.1 %.  Scheduled In office pacemaker check 08/30/2018:  There were 6 AHR Episodes Since 08/08/2018, EGM recordings reveal maximum a rate between 180-201 bpm,, The longest His persistent since 08/30/2018,Atrial rate 180, ventricular rate 1 28 bpm, EGM's suggest atrial tachycardia.  There was 0.4% truly due to atrial arrhythmia burden, pacemaker thresholds and impedance stable.  Longevity 8.5, 12 years.  Recommendations:   Patient presents for follow up and for 1 year in office pacer check. Over the last few weeks, she has had weakness, tiredness, and episodes of heart racing. She is feeling better today. She is noted to in atypical atrial flutter on EKG and also by pacer interrogation. At her last visit, amiodarone was reduced to reduce risk of long term side effects; however, I suspect that this is contributing to her again developing atrial tachycardia and atrial flutter. Will have her start Amiodarone 200 mg BID for 1 week. Will also start Verapamil 120 mg daily. Will plan for cardioversion this week. Schedule for Direct current cardioversion. I have discussed regarding risks benefits rate control vs rhythm control with the patient. Patient understands cardiac arrest and need for CPR, aspiration pneumonia, but not limited to these. Patient is willing.   Blood pressure is borderline soft. I will discontinue her Isosorbide. Will have her take half a tablet of olmesartan HCT. She has not had any symptoms of angina. No evidence of decompensated heart.  She does have shortness of breath that is likely multifactoral. Will obtain CXR  since she has been on amiodarone for sometime.   Pacermaker is functioning appropriately. Noted to have several episodes of atrial tachycardia.   I will plan to see her back after cardioversion for follow up.    *I have discussed this case with Dr. Einar Gip and he personally examined the patient and participated in formulating the plan.*

## 2018-08-30 NOTE — Patient Instructions (Addendum)
For the next 1 week, take 200 mg of Amiodarone twice a day, AM and PM. Then take 200 mg daily  Will get CXR for surveillance  Take half a tablet of Olemesartan HCT  Stop Isosorbide

## 2018-08-30 NOTE — Progress Notes (Signed)
Primary Physician:  Deland Pretty, MD   Patient ID: [name], female    DOB: 07/05/1945, 73 y.o.   MRN: [id number]  Subjective:    Chief Complaint  Patient presents with  . atrial tachycardia  . Pacemaker Check    1 yr    HPI: [name]  is a 73 y.o. female  with hypertension, type 2 diabetes mellitus, nonobstructive CAD (Cath 2019), sick sinus syndrome status post dual-chamber pacemaker 2017, hyperlipidemia, stage III chronic kidney disease, OSA, paroxysmal Afib, paroxysmal atrial tachycardia.  Patient was last seen in April virtually, to reduce risk of long-term side effects from amiodarone, this was reduced to 100 mg.  At that time she was doing well without any shortness of breath or leg edema.  A few weeks ago she noticed some heart racing episodes that would improve with sitting down and resting.  A few days ago she called our office complaining of tiredness and weakness, but states she is feeling some better today.  Has had worsening shortness of breath and occasional leg edema that improves with use of torsemide.  Overall, denies any chest pain, orthopnea, PND, presncope, syncope, TIA.     Past Medical History:  Diagnosis Date  . Arthritis   . Diabetes mellitus without complication ([place])   . History of chickenpox   . Hx of psoriatic arthritis   . Hyperlipemia   . Hypertension   . Hypertension 05/03/2018  . Mitral valve disorders(424.0)   . Obstructive sleep apnea   . Pacemaker   . Peripheral neuropathy   . Renal disorder Kidney disease stage2  . Thyroid disease hypothyroid  . Urine incontinence   . Vitamin D deficiency     Past Surgical History:  Procedure Laterality Date  . CARDIAC CATHETERIZATION N/A 04/29/2015   Procedure: Temporary Pacemaker;  Surgeon: Adrian Prows, MD;  Location: [facility];  Service: Cardiovascular;  Laterality: N/A;  . EP IMPLANTABLE DEVICE N/A 04/30/2015   Procedure: Pacemaker Implant;  Surgeon: Evans Lance, MD;   Location: [facility];  Service: Cardiovascular;  Laterality: N/A;  . INTRAVASCULAR PRESSURE WIRE/FFR STUDY N/A 08/31/2017   Procedure: INTRAVASCULAR PRESSURE WIRE/FFR STUDY;  Surgeon: Nigel Mormon, MD;  Location: [facility];  Service: Cardiovascular;  Laterality: N/A;  . LEFT AND RIGHT HEART CATHETERIZATION WITH CORONARY ANGIOGRAM N/A 09/30/2011   Procedure: LEFT AND RIGHT HEART CATHETERIZATION WITH CORONARY ANGIOGRAM;  Surgeon: Laverda Page, MD;  Location: [facility];  Service: Cardiovascular;  Laterality: N/A;  . RIGHT/LEFT HEART CATH AND CORONARY ANGIOGRAPHY N/A 08/31/2017   Procedure: RIGHT/LEFT HEART CATH AND CORONARY ANGIOGRAPHY;  Surgeon: Nigel Mormon, MD;  Location: [facility];  Service: Cardiovascular;  Laterality: N/A;  . YAG LASER APPLICATION Right 9/41/7408   Procedure: YAG LASER APPLICATION;  Surgeon: Elta Guadeloupe T. Gershon Crane, MD;  Location: [facility];  Service: Ophthalmology;  Laterality: Right;  pt knows to arrive at 11:15  . YAG LASER APPLICATION Left 01/17/4816   Procedure: YAG LASER APPLICATION;  Surgeon: Rutherford Guys, MD;  Location: [facility];  Service: Ophthalmology;  Laterality: Left;    Social History   Socioeconomic History  . Marital status: Married    Spouse name: Not on file  . Number of children: 3  . Years of education: Not on file  . Highest education level: Not on file  Occupational History  . Not on file  Social Needs  . Financial resource strain: Not on file  .  Food insecurity    Worry: Not on file    Inability: Not on file  . Transportation needs    Medical: Not on file    Non-medical: Not on file  Tobacco Use  . Smoking status: Never Smoker  . Smokeless tobacco: Never Used  Substance and Sexual Activity  . Alcohol use: No    Alcohol/week: 0.0 standard drinks  . Drug use: No  . Sexual activity: Not Currently  Lifestyle  . Physical activity    Days per week: Not on file    Minutes per session: Not on file  . Stress:  Not on file  Relationships  . Social Herbalist on phone: Not on file    Gets together: Not on file    Attends religious service: Not on file    Active member of club or organization: Not on file    Attends meetings of clubs or organizations: Not on file    Relationship status: Not on file  . Intimate partner violence    Fear of current or ex partner: Not on file    Emotionally abused: Not on file    Physically abused: Not on file    Forced sexual activity: Not on file  Other Topics Concern  . Not on file  Social History Narrative  . Not on file    Review of Systems  Constitution: Negative for chills, decreased appetite, malaise/fatigue and weight gain.  Cardiovascular: Positive for dyspnea on exertion (stable). Negative for chest pain, claudication, leg swelling, orthopnea, palpitations and syncope.  Respiratory: Negative for cough, hemoptysis and wheezing.   Endocrine: Negative for cold intolerance.  Hematologic/Lymphatic: Does not bruise/bleed easily.  Musculoskeletal: Negative for arthritis and joint swelling.  Gastrointestinal: Negative for abdominal pain, anorexia and change in bowel habit.  Neurological: Negative for headaches and light-headedness.  Psychiatric/Behavioral: Negative for depression and substance abuse.  All other systems reviewed and are negative.     Objective:  Blood pressure (!) 104/53, pulse (!) 109, height 5\' 2"  (1.575 m), weight 202 lb 4.8 oz (91.8 kg), SpO2 94 %. Body mass index is 37 kg/m.  Physical Exam  Constitutional: She appears well-developed and well-nourished. No distress.  HENT:  Head: Atraumatic.  Eyes: Conjunctivae are normal.  Neck: Neck supple. No JVD present. No thyromegaly present.  Cardiovascular: Regular rhythm, S1 normal, S2 normal and intact distal pulses. Tachycardia present. Exam reveals no gallop.  Murmur heard. High-pitched blowing holosystolic murmur is present with a grade of 2/6 at the apex. Pulses:       Carotid pulses are 2+ on the right side and 2+ on the left side with bruit.      Popliteal pulses are 1+ on the right side and 1+ on the left side.       Dorsalis pedis pulses are 1+ on the right side and 1+ on the left side.       Posterior tibial pulses are 1+ on the right side and 1+ on the left side.  II/VI SEM in the right parasternal border  Pulmonary/Chest: Effort normal and breath sounds normal.  Left infraclavicular pacemaker pocket noted  Abdominal: Soft. Bowel sounds are normal.  Musculoskeletal: Normal range of motion.        General: No edema.  Neurological: She is alert.  Skin: Skin is warm and dry.  Psychiatric: She has a normal mood and affect.   Radiology: Dg Chest 2 View  Result Date: 08/30/2018 CLINICAL DATA:  SOB, non smoker,  pacemaker EXAM: CHEST - 2 VIEW COMPARISON:  Chest radiographs 10/24/2017, 06/07/2017 FINDINGS: Stable cardiomediastinal contours with enlarged heart size. Dual lead left chest pacemaker with tip terminating in the right atrium and right ventricle. There is central venous congestion without overt edema. No new focal infiltrate. No pneumothorax or pleural effusion. Degenerative changes are seen in the thoracic spine. IMPRESSION: Cardiomegaly with central venous congestion, no overt edema. Electronically Signed   By: Audie Pinto M.D.   On: 08/30/2018 17:04    Laboratory examination:    CMP Latest Ref Rng & Units 06/30/2018 10/24/2017 09/01/2017  Glucose 70 - 99 mg/dL 119(H) 158(H) 114(H)  BUN 8 - 23 mg/dL 19 16 29(H)  Creatinine 0.44 - 1.00 mg/dL 1.29(H) 1.37(H) 1.27(H)  Sodium 135 - 145 mmol/L 140 135 135  Potassium 3.5 - 5.1 mmol/L 4.2 4.1 4.4  Chloride 98 - 111 mmol/L 104 98 101  CO2 22 - 32 mmol/L 26 27 26   Calcium 8.9 - 10.3 mg/dL 10.1 9.6 9.2  Total Protein 6.5 - 8.1 g/dL 7.6 - -  Total Bilirubin 0.3 - 1.2 mg/dL 0.3 - -  Alkaline Phos 38 - 126 U/L 29(L) - -  AST 15 - 41 U/L 25 - -  ALT 0 - 44 U/L 23 - -   CBC Latest Ref Rng &  Units 06/30/2018 10/24/2017 09/01/2017  WBC 4.0 - 10.5 K/uL 6.3 8.2 7.9  Hemoglobin 12.0 - 15.0 g/dL 13.7 12.1 14.4  Hematocrit 36.0 - 46.0 % 43.6 38.6 45.1  Platelets 150 - 400 K/uL 152 128(L) 135(L)   Lipid Panel     Component Value Date/Time   CHOL 168 08/29/2017 0328   TRIG 544 (H) 08/29/2017 0328   HDL 19 (L) 08/29/2017 0328   CHOLHDL 8.8 08/29/2017 0328   VLDL UNABLE TO CALCULATE IF TRIGLYCERIDE OVER 400 mg/dL 08/29/2017 0328   LDLCALC UNABLE TO CALCULATE IF TRIGLYCERIDE OVER 400 mg/dL 08/29/2017 0328   HEMOGLOBIN A1C Lab Results  Component Value Date   HGBA1C 7.3 (H) 09/30/2015   MPG 163 09/30/2015   TSH No results for input(s): TSH in the last 8760 hours.  PRN Meds:. Medications Discontinued During This Encounter  Medication Reason  . amiodarone (PACERONE) 200 MG tablet Dose change  . isosorbide mononitrate (IMDUR) 30 MG 24 hr tablet Discontinued by provider   Current Meds  Medication Sig  . apixaban (ELIQUIS) 5 MG TABS tablet Take 1 tablet (5 mg total) by mouth 2 (two) times daily. (Patient taking differently: Take 5 mg by mouth See admin instructions. Take 5 mg by mouth two times a day- before breakfast and before supper)  . cholecalciferol (VITAMIN D) 25 MCG (1000 UT) tablet Take 1,000 Units by mouth 2 (two) times daily.  . Cholecalciferol (VITAMIN D-3) 1000 units CAPS Take 2,000 Units by mouth daily.  . fenofibrate 160 MG tablet Take 160 mg by mouth daily with supper.   . insulin aspart protamine- aspart (NOVOLOG MIX 70/30) (70-30) 100 UNIT/ML injection Inject 20-36 Units into the skin See admin instructions. Inject 70units into the skin with breakfast and 35 units with supper (patient reported 06/30/2018)  . levothyroxine (SYNTHROID, LEVOTHROID) 50 MCG tablet Take 50 mcg by mouth daily before breakfast.  . metoprolol tartrate (LOPRESSOR) 50 MG tablet Take 1 tablet by mouth twice daily  . nitroGLYCERIN (NITROSTAT) 0.4 MG SL tablet Place 1 tablet (0.4 mg total) under  the tongue every 5 (five) minutes as needed for chest pain.  Marland Kitchen nystatin cream (MYCOSTATIN) Apply 1  application topically 2 (two) times daily as needed (for irritation of affected area).  . olmesartan-hydrochlorothiazide (BENICAR HCT) 40-25 MG tablet Take 1 tablet by mouth daily.  Marland Kitchen oxybutynin (DITROPAN-XL) 5 MG 24 hr tablet TAKE 1 TABLET BY MOUTH AT BEDTIME  . simvastatin (ZOCOR) 20 MG tablet TAKE 1 TABLET BY MOUTH IN THE EVENING AFTER DINNER - DISCONTINUE PRAVASTATIN - MYALGIA  . spironolactone (ALDACTONE) 25 MG tablet TAKE 1 TABLET BY MOUTH IN THE MORNING  . torsemide (DEMADEX) 20 MG tablet Take 1 tablet (20 mg total) by mouth as needed. As needed for leg edema  . triamcinolone ointment (KENALOG) 0.5 % Apply 1 application topically 2 (two) times daily as needed (for itching).  . vitamin B-12 (CYANOCOBALAMIN) 500 MCG tablet Take 500 mcg by mouth 2 (two) times daily.  . [DISCONTINUED] amiodarone (PACERONE) 200 MG tablet Take 100 mg by mouth daily.  . [DISCONTINUED] isosorbide mononitrate (IMDUR) 30 MG 24 hr tablet Take 1 tablet by mouth once daily    Cardiac Studies:   Carotid Doppler [02/10/2014]: No evidence of hemodynamically significant stenosis in the bilateral carotid bifurcation vessels. There is mild evidence of homogeneous plaque in bilateral carotid arteries. No significant change from 10/13/2011.  Echocardiogram 07/01/2017: Left ventricle cavity is normal in size. Moderate concentric hypertrophy of the left ventricle. Normal global wall motion. Doppler evidence of grade III (restrictive) diastolic dysfunction. Diastolic dysfunction findings suggests elevated LA/LV end diastolic pressure. Calculated EF 60%. Left atrial cavity is moderately dilated at 4.6cm. Moderate (Grade II) mitral regurgitation. Mild tricuspid regurgitation. Mild pulmonary hypertension. Estimated pulmonary artery systolic pressure 36 mmHg IVC is normal with blunted respiratory response, suggests elevated central  venous pressure.  Lexiscan myoview stress test 06/15/2017: 1. Lexiscan stress test was performed. Exercise capacity was not assessed. Stress symptoms included dizziness. Peak blood pressure 158/66 mmHg. Stress EKG is non diagnostic for ischemia as it is a pharmacologic stress. In addition, it demonstrated atrial pacing and incomplete LBBB. 2. The overall quality of the study is excellent. There is no evidence of abnormal lung activity. Stress and rest SPECT images demonstrate homogeneous tracer distribution throughout the myocardium. Gated SPECT imaging reveals normal myocardial thickening and wall motion. The left ventricular ejection fraction was normal (71%). 3. Low risk study.  Sleep Study [08/11/2017]: Positive for Complex Sleep Apnea; On CPAP   R&LHC 08/31/2017: LCx: Nondominant. AV grove LCx 50-60% diffuse disease. RCA: Large dominant. Ostial PDA 50% stenosis, FFR 0.96. No change from 09/30/2011.  RA: 5 mmHg RV: 45/4 mmHg. PA: 40/12 mmHg. Mean PA 29 mmHg PCWP: 16 mmHg LV: 130/3 mmHg, LVEDP 13 mmHg  CO: 3.6 L/min. CI 1.9 L/min/m2  Assessment:     ICD-10-CM   1. Encounter for interrogation of cardiac pacemaker  Z45.018   2. Sick sinus syndrome (HCC)  I49.5   3. Cardiac pacemaker in situ on 04/30/2015 [employer] Adapta Dr Peggyann Juba; Leads only MRI compatible: Sinus node dysfunction  Z95.0   4. Atypical atrial flutter (HCC)  I48.4   5. Paroxysmal atrial fibrillation ([facility]CC) CHA2DS2-VASc Score is 6 with yearly risk of stroke of 9.8%.   I48.0 EKG 12-Lead  6. Coronary artery disease involving native coronary artery of native heart without angina pectoris  I25.10   7. Shortness of breath  R06.02 DG Chest 2 View    EKG 08/30/2018: Atypical atrial flutter with 3:1 conduction, V rate 110 bpm, left axis deviation. LBBB.  Remote  Pacemaker transmission 03/05/2018: No mode switches. No VHR episodes. Battery longevity is 8-11.5 years. RA  pacing is 99.9 %, RV pacing is 16 %Remote pacemaker  check  06/11/18: There were 832 atrial high rate episodes detected. The longest lasted 0:00:12:10 in duration. There was a 0.2 % cumulative atrial arrhythmia burden. Mostly markers seen suggesting false AF. There were 0 high ventricular rate episodes detected. Health trends do not demonstrate significant abnormality. Battery longevity is 8.5 years. RA pacing is 85.3 %, RV pacing is 13.1 %.  Scheduled In office pacemaker check 08/30/2018:  There were 6 AHR Episodes Since 08/08/2018, EGM recordings reveal maximum a rate between 180-201 bpm,, The longest His persistent since 08/30/2018,Atrial rate 180, ventricular rate 1 28 bpm, EGM's suggest atrial tachycardia.  There was 0.4% truly due to atrial arrhythmia burden, pacemaker thresholds and impedance stable.  Longevity 8.5, 12 years.  Recommendations:   Patient presents for follow up and for 1 year in office pacer check. Over the last few weeks, she has had weakness, tiredness, and episodes of heart racing. She is feeling better today. She is noted to in atypical atrial flutter on EKG and also by pacer interrogation. At her last visit, amiodarone was reduced to reduce risk of long term side effects; however, I suspect that this is contributing to her again developing atrial tachycardia and atrial flutter. Will have her start Amiodarone 200 mg BID for 1 week. Will also start Verapamil 120 mg daily. Will plan for cardioversion this week. Schedule for Direct current cardioversion. I have discussed regarding risks benefits rate control vs rhythm control with the patient. Patient understands cardiac arrest and need for CPR, aspiration pneumonia, but not limited to these. Patient is willing.   Blood pressure is borderline soft. I will discontinue her Isosorbide. Will have her take half a tablet of olmesartan HCT. She has not had any symptoms of angina. No evidence of decompensated heart.  She does have shortness of breath that is likely multifactoral. Will obtain CXR  since she has been on amiodarone for sometime.   Pacermaker is functioning appropriately. Noted to have several episodes of atrial tachycardia.   I will plan to see her back after cardioversion for follow up.    *I have discussed this case with Dr. Einar Gip and he personally examined the patient and participated in formulating the plan.*

## 2018-08-31 ENCOUNTER — Encounter: Payer: Self-pay | Admitting: Cardiology

## 2018-08-31 DIAGNOSIS — I484 Atypical atrial flutter: Secondary | ICD-10-CM | POA: Diagnosis not present

## 2018-08-31 DIAGNOSIS — I480 Paroxysmal atrial fibrillation: Secondary | ICD-10-CM | POA: Diagnosis not present

## 2018-08-31 DIAGNOSIS — G4733 Obstructive sleep apnea (adult) (pediatric): Secondary | ICD-10-CM | POA: Diagnosis not present

## 2018-09-01 LAB — BASIC METABOLIC PANEL
BUN/Creatinine Ratio: 17 (ref 12–28)
BUN: 24 mg/dL (ref 8–27)
CO2: 25 mmol/L (ref 20–29)
Calcium: 10.5 mg/dL — ABNORMAL HIGH (ref 8.7–10.3)
Chloride: 100 mmol/L (ref 96–106)
Creatinine, Ser: 1.39 mg/dL — ABNORMAL HIGH (ref 0.57–1.00)
GFR calc Af Amer: 43 mL/min/{1.73_m2} — ABNORMAL LOW (ref >59)
GFR calc non Af Amer: 37 mL/min/{1.73_m2} — ABNORMAL LOW (ref >59)
Glucose: 146 mg/dL — ABNORMAL HIGH (ref 65–99)
Potassium: 5.2 mmol/L (ref 3.5–5.2)
Sodium: 139 mmol/L (ref 134–144)

## 2018-09-02 DIAGNOSIS — R069 Unspecified abnormalities of breathing: Secondary | ICD-10-CM | POA: Diagnosis not present

## 2018-09-02 DIAGNOSIS — I5033 Acute on chronic diastolic (congestive) heart failure: Secondary | ICD-10-CM | POA: Diagnosis not present

## 2018-09-03 ENCOUNTER — Other Ambulatory Visit (HOSPITAL_COMMUNITY)
Admission: RE | Admit: 2018-09-03 | Discharge: 2018-09-03 | Disposition: A | Discharge status: Home / Self Care | Payer: Medicare HMO | Source: Ambulatory Visit | Attending: Cardiology | Admitting: Cardiology

## 2018-09-03 ENCOUNTER — Encounter: Payer: Self-pay | Admitting: Cardiology

## 2018-09-03 ENCOUNTER — Other Ambulatory Visit: Payer: Self-pay

## 2018-09-03 DIAGNOSIS — Z20828 Contact with and (suspected) exposure to other viral communicable diseases: Secondary | ICD-10-CM | POA: Insufficient documentation

## 2018-09-03 DIAGNOSIS — Z45018 Encounter for adjustment and management of other part of cardiac pacemaker: Secondary | ICD-10-CM

## 2018-09-03 DIAGNOSIS — Z01812 Encounter for preprocedural laboratory examination: Secondary | ICD-10-CM | POA: Diagnosis not present

## 2018-09-03 DIAGNOSIS — I4891 Unspecified atrial fibrillation: Secondary | ICD-10-CM | POA: Insufficient documentation

## 2018-09-03 HISTORY — DX: Encounter for adjustment and management of other part of cardiac pacemaker: Z45.018

## 2018-09-03 LAB — SARS CORONAVIRUS 2 (TAT 6-24 HRS): SARS Coronavirus 2: NEGATIVE

## 2018-09-06 NOTE — Progress Notes (Signed)
Pt states they have been quarantined since covid test. Denies fever/shortness of breath, cough. Pt understands visitor policy.

## 2018-09-07 ENCOUNTER — Other Ambulatory Visit: Payer: Self-pay

## 2018-09-07 ENCOUNTER — Ambulatory Visit (HOSPITAL_COMMUNITY): Payer: Medicare HMO | Admitting: Anesthesiology

## 2018-09-07 ENCOUNTER — Ambulatory Visit (HOSPITAL_COMMUNITY)
Admission: RE | Admit: 2018-09-07 | Discharge: 2018-09-07 | Disposition: A | Discharge status: Home / Self Care | Payer: Medicare HMO | Attending: Cardiology | Admitting: Cardiology

## 2018-09-07 ENCOUNTER — Encounter (HOSPITAL_COMMUNITY): Payer: Self-pay | Admitting: *Deleted

## 2018-09-07 ENCOUNTER — Encounter (HOSPITAL_COMMUNITY)
Admission: RE | Disposition: A | Discharge status: Home / Self Care | Payer: Self-pay | Source: Home / Self Care | Attending: Cardiology

## 2018-09-07 DIAGNOSIS — I495 Sick sinus syndrome: Secondary | ICD-10-CM | POA: Insufficient documentation

## 2018-09-07 DIAGNOSIS — Z79899 Other long term (current) drug therapy: Secondary | ICD-10-CM | POA: Insufficient documentation

## 2018-09-07 DIAGNOSIS — I129 Hypertensive chronic kidney disease with stage 1 through stage 4 chronic kidney disease, or unspecified chronic kidney disease: Secondary | ICD-10-CM | POA: Insufficient documentation

## 2018-09-07 DIAGNOSIS — E1122 Type 2 diabetes mellitus with diabetic chronic kidney disease: Secondary | ICD-10-CM | POA: Insufficient documentation

## 2018-09-07 DIAGNOSIS — I447 Left bundle-branch block, unspecified: Secondary | ICD-10-CM | POA: Insufficient documentation

## 2018-09-07 DIAGNOSIS — I484 Atypical atrial flutter: Secondary | ICD-10-CM | POA: Diagnosis not present

## 2018-09-07 DIAGNOSIS — I110 Hypertensive heart disease with heart failure: Secondary | ICD-10-CM | POA: Diagnosis not present

## 2018-09-07 DIAGNOSIS — G4733 Obstructive sleep apnea (adult) (pediatric): Secondary | ICD-10-CM | POA: Insufficient documentation

## 2018-09-07 DIAGNOSIS — E785 Hyperlipidemia, unspecified: Secondary | ICD-10-CM | POA: Diagnosis not present

## 2018-09-07 DIAGNOSIS — I5043 Acute on chronic combined systolic (congestive) and diastolic (congestive) heart failure: Secondary | ICD-10-CM | POA: Diagnosis not present

## 2018-09-07 DIAGNOSIS — G629 Polyneuropathy, unspecified: Secondary | ICD-10-CM | POA: Insufficient documentation

## 2018-09-07 DIAGNOSIS — Z950 Presence of cardiac pacemaker: Secondary | ICD-10-CM | POA: Insufficient documentation

## 2018-09-07 DIAGNOSIS — I2510 Atherosclerotic heart disease of native coronary artery without angina pectoris: Secondary | ICD-10-CM | POA: Insufficient documentation

## 2018-09-07 DIAGNOSIS — Z7901 Long term (current) use of anticoagulants: Secondary | ICD-10-CM | POA: Diagnosis not present

## 2018-09-07 DIAGNOSIS — N183 Chronic kidney disease, stage 3 (moderate): Secondary | ICD-10-CM | POA: Insufficient documentation

## 2018-09-07 DIAGNOSIS — I480 Paroxysmal atrial fibrillation: Secondary | ICD-10-CM | POA: Insufficient documentation

## 2018-09-07 DIAGNOSIS — E559 Vitamin D deficiency, unspecified: Secondary | ICD-10-CM | POA: Insufficient documentation

## 2018-09-07 DIAGNOSIS — R0602 Shortness of breath: Secondary | ICD-10-CM | POA: Insufficient documentation

## 2018-09-07 HISTORY — PX: CARDIOVERSION: SHX1299

## 2018-09-07 LAB — POCT I-STAT, CHEM 8
BUN: 30 mg/dL — ABNORMAL HIGH (ref 8–23)
Calcium, Ion: 1.34 mmol/L (ref 1.15–1.40)
Chloride: 99 mmol/L (ref 98–111)
Creatinine, Ser: 1.5 mg/dL — ABNORMAL HIGH (ref 0.44–1.00)
Glucose, Bld: 149 mg/dL — ABNORMAL HIGH (ref 70–99)
HCT: 45 % (ref 36.0–46.0)
Hemoglobin: 15.3 g/dL — ABNORMAL HIGH (ref 12.0–15.0)
Potassium: 4.9 mmol/L (ref 3.5–5.1)
Sodium: 139 mmol/L (ref 135–145)
TCO2: 29 mmol/L (ref 22–32)

## 2018-09-07 SURGERY — CARDIOVERSION
Anesthesia: General

## 2018-09-07 MED ORDER — SODIUM CHLORIDE 0.9 % IV SOLN
INTRAVENOUS | Status: AC | PRN
  Administered 2018-09-07: 500 mL via INTRAVENOUS

## 2018-09-07 MED ORDER — PROPOFOL 10 MG/ML IV BOLUS
INTRAVENOUS | Status: DC | PRN
  Administered 2018-09-07: 50 mg via INTRAVENOUS

## 2018-09-07 MED ORDER — LIDOCAINE HCL (CARDIAC) PF 100 MG/5ML IV SOSY
PREFILLED_SYRINGE | INTRAVENOUS | Status: DC | PRN
  Administered 2018-09-07: 20 mg via INTRAVENOUS

## 2018-09-07 NOTE — Interval H&P Note (Signed)
History and Physical Interval Note:  09/07/2018 1:07 PM  [name]  has presented today for surgery, with the diagnosis of AFIB.  The various methods of treatment have been discussed with the patient and family. After consideration of risks, benefits and other options for treatment, the patient has consented to  Procedure(s): CARDIOVERSION (N/A) as a surgical intervention.  The patient's history has been reviewed, patient examined, no change in status, stable for surgery.  I have reviewed the patient's chart and labs.  Questions were answered to the patient's satisfaction.     Adrian Prows

## 2018-09-07 NOTE — Anesthesia Postprocedure Evaluation (Signed)
Anesthesia Post Note  Patient: [name]  Procedure(s) Performed: CARDIOVERSION (N/A )     Patient location during evaluation: PACU Anesthesia Type: General Level of consciousness: awake and alert Pain management: pain level controlled Vital Signs Assessment: post-procedure vital signs reviewed and stable Respiratory status: spontaneous breathing, nonlabored ventilation, respiratory function stable and patient connected to nasal cannula oxygen Cardiovascular status: blood pressure returned to baseline and stable Postop Assessment: no apparent nausea or vomiting Anesthetic complications: no    Last Vitals:  Vitals:   09/07/18 1322 09/07/18 1327  BP: (!) 103/57 (!) 97/47  Pulse: 60 60  Resp: 16 17  Temp:  36.8 C  SpO2: 100% 98%    Last Pain:  Vitals:   09/07/18 1327  TempSrc: Oral  PainSc: 0-No pain                 Tiajuana Amass

## 2018-09-07 NOTE — Anesthesia Preprocedure Evaluation (Signed)
Anesthesia Evaluation  Patient identified by MRN, date of birth, ID band Patient awake    Reviewed: Allergy & Precautions, NPO status , Patient's Chart, lab work & pertinent test results  Airway Mallampati: II  TM Distance: >3 FB     Dental   Pulmonary sleep apnea ,    breath sounds clear to auscultation       Cardiovascular hypertension, Pt. on medications and Pt. on home beta blockers + CAD and +CHF  + dysrhythmias + pacemaker  Rhythm:Irregular Rate:Normal     Neuro/Psych negative neurological ROS     GI/Hepatic negative GI ROS, Neg liver ROS,   Endo/Other  diabetes, Type 2, Insulin Dependent  Renal/GU Renal InsufficiencyRenal disease     Musculoskeletal   Abdominal   Peds  Hematology negative hematology ROS (+)   Anesthesia Other Findings   Reproductive/Obstetrics                             Anesthesia Physical Anesthesia Plan  ASA: III  Anesthesia Plan: General   Post-op Pain Management:    Induction: Intravenous  PONV Risk Score and Plan: Treatment may vary due to age or medical condition  Airway Management Planned: Natural Airway and Mask  Additional Equipment:   Intra-op Plan:   Post-operative Plan:   Informed Consent: I have reviewed the patients History and Physical, chart, labs and discussed the procedure including the risks, benefits and alternatives for the proposed anesthesia with the patient or authorized representative who has indicated his/her understanding and acceptance.       Plan Discussed with: CRNA  Anesthesia Plan Comments:         Anesthesia Quick Evaluation

## 2018-09-07 NOTE — Discharge Instructions (Signed)

## 2018-09-07 NOTE — Transfer of Care (Signed)
Immediate Anesthesia Transfer of Care Note  Patient: [name]  Procedure(s) Performed: CARDIOVERSION (N/A )  Patient Location: Endoscopy Unit  Anesthesia Type:General  Level of Consciousness: awake and alert   Airway & Oxygen Therapy: Patient Spontanous Breathing and Patient connected to nasal cannula oxygen  Post-op Assessment: Report given to RN and Post -op Vital signs reviewed and stable  Post vital signs: Reviewed and stable  Last Vitals:  Vitals Value Taken Time  BP 103/57 09/07/18 1322  Temp    Pulse 60 09/07/18 1322  Resp 16 09/07/18 1322  SpO2 100 % 09/07/18 1322    Last Pain:  Vitals:   09/07/18 1214  PainSc: 0-No pain         Complications: No apparent anesthesia complications

## 2018-09-07 NOTE — CV Procedure (Signed)
Direct current cardioversion:  Indication symptomatic A. Flutter.  Procedure: Using 50 mg of IV Propofol and 20 IV Lidocaine (for reducing venous pain) for achieving deep sedation, synchronized direct current cardioversion performed. Patient was delivered with 120 Joules of electricity X 1 with success to A-Paced Rhythm. Patient tolerated the procedure well. No immediate complication noted.

## 2018-09-08 ENCOUNTER — Encounter (HOSPITAL_COMMUNITY): Payer: Self-pay | Admitting: Cardiology

## 2018-09-08 NOTE — Addendum Note (Signed)
Addendum  created 09/08/18 2132 by Suzette Battiest, MD   Intraprocedure Staff edited

## 2018-09-13 ENCOUNTER — Ambulatory Visit: Payer: Medicare HMO | Admitting: Cardiology

## 2018-09-13 ENCOUNTER — Encounter: Payer: Self-pay | Admitting: Cardiology

## 2018-09-13 VITALS — BP 135/50 | HR 60 | Ht 62.0 in | Wt 196.0 lb

## 2018-09-13 DIAGNOSIS — Z9889 Other specified postprocedural states: Secondary | ICD-10-CM

## 2018-09-13 DIAGNOSIS — Z950 Presence of cardiac pacemaker: Secondary | ICD-10-CM

## 2018-09-13 DIAGNOSIS — I4892 Unspecified atrial flutter: Secondary | ICD-10-CM | POA: Diagnosis not present

## 2018-09-13 DIAGNOSIS — I2510 Atherosclerotic heart disease of native coronary artery without angina pectoris: Secondary | ICD-10-CM

## 2018-09-13 DIAGNOSIS — I480 Paroxysmal atrial fibrillation: Secondary | ICD-10-CM

## 2018-09-13 NOTE — Progress Notes (Signed)
Primary Physician:  Deland Pretty, MD   Patient ID: [name], female    DOB: 1945/05/30, 73 y.o.   MRN: [id number]  Subjective:    Chief Complaint  Patient presents with   Follow-up   Atrial Fibrillation    HPI: [name]  is a 73 y.o. female  with hypertension, type 2 diabetes mellitus, nonobstructive CAD (Cath 2019), sick sinus syndrome status post dual-chamber pacemaker 2017, hyperlipidemia, stage III chronic kidney disease, OSA, paroxysmal Afib, paroxysmal atrial tachycardia.  Patient was recently seen and noted to be in Atrial flutter, she was started on amiodarone and underwent successful cardioversion on 09/07/2018. She now presents for follow up.   She reports not feeling well since her cardioversion. States that she is tired and gets short of breath with minimal activity. She has not noted any heart racing or palpitations. Has occasional leg edema that improves with use of torsemide.  Overall, denies any chest pain, orthopnea, PND, presncope, syncope, TIA.     Past Medical History:  Diagnosis Date   Arthritis    Diabetes mellitus without complication ([place])    Encounter for care of pacemaker 09/03/2018   History of chickenpox    Hx of psoriatic arthritis    Hyperlipemia    Hypertension    Hypertension 05/03/2018   Mitral valve disorders(424.0)    Obstructive sleep apnea    Pacemaker    Paroxysmal atrial fibrillation ([place]) 10/11/2016   Peripheral neuropathy    Renal disorder Kidney disease stage2   Thyroid disease hypothyroid   Urine incontinence    Vitamin D deficiency     Past Surgical History:  Procedure Laterality Date   CARDIAC CATHETERIZATION N/A 04/29/2015   Procedure: Temporary Pacemaker;  Surgeon: Adrian Prows, MD;  Location: [facility] [facility];  Service: Cardiovascular;  Laterality: N/A;   CARDIOVERSION N/A 09/07/2018   Procedure: CARDIOVERSION;  Surgeon: Adrian Prows, MD;  Location: [place];  Service: Cardiovascular;   Laterality: N/A;   CARDIOVERSION     EP IMPLANTABLE DEVICE N/A 04/30/2015   Procedure: Pacemaker Implant;  Surgeon: Evans Lance, MD;  Location: [place] [facility];  Service: Cardiovascular;  Laterality: N/A;   INTRAVASCULAR PRESSURE WIRE/FFR STUDY N/A 08/31/2017   Procedure: INTRAVASCULAR PRESSURE WIRE/FFR STUDY;  Surgeon: Nigel Mormon, MD;  Location: [place] [facility];  Service: Cardiovascular;  Laterality: N/A;   LEFT AND RIGHT HEART CATHETERIZATION WITH CORONARY ANGIOGRAM N/A 09/30/2011   Procedure: LEFT AND RIGHT HEART CATHETERIZATION WITH CORONARY ANGIOGRAM;  Surgeon: Laverda Page, MD;  Location: [facility];  Service: Cardiovascular;  Laterality: N/A;   RIGHT/LEFT HEART CATH AND CORONARY ANGIOGRAPHY N/A 08/31/2017   Procedure: RIGHT/LEFT HEART CATH AND CORONARY ANGIOGRAPHY;  Surgeon: Nigel Mormon, MD;  Location: [place] [facility];  Service: Cardiovascular;  Laterality: N/A;   YAG LASER APPLICATION Right [id number]   Procedure: YAG LASER APPLICATION;  Surgeon: Elta Guadeloupe T. Gershon Crane, MD;  Location: [facility];  Service: Ophthalmology;  Laterality: Right;  pt knows to arrive at [id number]   YAG LASER APPLICATION Left [id number]   Procedure: YAG LASER APPLICATION;  Surgeon: Rutherford Guys, MD;  Location: [facility];  Service: Ophthalmology;  Laterality: Left;    Social History   Socioeconomic History   Marital status: Married    Spouse name: Not on file   Number of children: 3   Years of education: Not on file   Highest education level: Not on file  Occupational History   Not on file  Social Designer, fashion/clothing strain: Not on file   Food insecurity    Worry: Not on file    Inability: Not on file   Transportation needs    Medical: Not on file    Non-medical: Not on file  Tobacco Use   Smoking status: Never Smoker   Smokeless tobacco: Never Used  Substance and Sexual Activity   Alcohol use: No    Alcohol/week: 0.0 standard drinks   Drug use: No    Sexual activity: Not Currently  Lifestyle   Physical activity    Days per week: Not on file    Minutes per session: Not on file   Stress: Not on file  Relationships   Social connections    Talks on phone: Not on file    Gets together: Not on file    Attends religious service: Not on file    Active member of club or organization: Not on file    Attends meetings of clubs or organizations: Not on file    Relationship status: Not on file   Intimate partner violence    Fear of current or ex partner: Not on file    Emotionally abused: Not on file    Physically abused: Not on file    Forced sexual activity: Not on file  Other Topics Concern   Not on file  Social History Narrative   Not on file    Review of Systems  Constitution: Positive for malaise/fatigue. Negative for chills, decreased appetite and weight gain.  Cardiovascular: Positive for dyspnea on exertion (with minimal activity). Negative for chest pain, claudication, leg swelling, orthopnea, palpitations and syncope.  Respiratory: Negative for cough, hemoptysis and wheezing.   Endocrine: Negative for cold intolerance.  Hematologic/Lymphatic: Does not bruise/bleed easily.  Musculoskeletal: Negative for arthritis and joint swelling.  Gastrointestinal: Negative for abdominal pain, anorexia and change in bowel habit.  Neurological: Negative for headaches and light-headedness.  Psychiatric/Behavioral: Negative for depression and substance abuse.  All other systems reviewed and are negative.     Objective:  Blood pressure (!) 135/50, pulse 60, height 5\' 2"  (1.575 m), weight 196 lb (88.9 kg), SpO2 93 %. Body mass index is 35.85 kg/m.  Physical Exam  Constitutional: She appears well-developed and well-nourished. No distress.  HENT:  Head: Atraumatic.  Eyes: Conjunctivae are normal.  Neck: Neck supple. No JVD present. No thyromegaly present.  Cardiovascular: Normal rate, regular rhythm, S1 normal, S2 normal and intact  distal pulses. Exam reveals no gallop.  Murmur heard. High-pitched blowing holosystolic murmur is present with a grade of 2/6 at the apex. Pulses:      Carotid pulses are 2+ on the right side and 2+ on the left side with bruit.      Popliteal pulses are 1+ on the right side and 1+ on the left side.       Dorsalis pedis pulses are 1+ on the right side and 1+ on the left side.       Posterior tibial pulses are 1+ on the right side and 1+ on the left side.  II/VI SEM in the right parasternal border  Pulmonary/Chest: Effort normal and breath sounds normal.  Left infraclavicular pacemaker pocket noted  Abdominal: Soft. Bowel sounds are normal.  Musculoskeletal: Normal range of motion.        General: No edema.  Neurological: She is alert.  Skin: Skin is warm and dry.  Psychiatric: She has a normal mood and affect.   Radiology: No  results found.  Laboratory examination:    CMP Latest Ref Rng & Units 09/07/2018 08/31/2018 06/30/2018  Glucose 70 - 99 mg/dL 149(H) 146(H) 119(H)  BUN 8 - 23 mg/dL 30(H) 24 19  Creatinine 0.44 - 1.00 mg/dL 1.50(H) 1.39(H) 1.29(H)  Sodium 135 - 145 mmol/L 139 139 140  Potassium 3.5 - 5.1 mmol/L 4.9 5.2 4.2  Chloride 98 - 111 mmol/L 99 100 104  CO2 20 - 29 mmol/L - 25 26  Calcium 8.7 - 10.3 mg/dL - 10.5(H) 10.1  Total Protein 6.5 - 8.1 g/dL - - 7.6  Total Bilirubin 0.3 - 1.2 mg/dL - - 0.3  Alkaline Phos 38 - 126 U/L - - 29(L)  AST 15 - 41 U/L - - 25  ALT 0 - 44 U/L - - 23   CBC Latest Ref Rng & Units 09/07/2018 06/30/2018 10/24/2017  WBC 4.0 - 10.5 K/uL - 6.3 8.2  Hemoglobin 12.0 - 15.0 g/dL 15.3(H) 13.7 12.1  Hematocrit 36.0 - 46.0 % 45.0 43.6 38.6  Platelets 150 - 400 K/uL - 152 128(L)   Lipid Panel     Component Value Date/Time   CHOL 168 08/29/2017 0328   TRIG 544 (H) 08/29/2017 0328   HDL 19 (L) 08/29/2017 0328   CHOLHDL 8.8 08/29/2017 0328   VLDL UNABLE TO CALCULATE IF TRIGLYCERIDE OVER 400 mg/dL 08/29/2017 0328   LDLCALC UNABLE TO CALCULATE  IF TRIGLYCERIDE OVER 400 mg/dL 08/29/2017 0328   HEMOGLOBIN A1C Lab Results  Component Value Date   HGBA1C 7.3 (H) 09/30/2015   MPG 163 09/30/2015   TSH No results for input(s): TSH in the last 8760 hours.  PRN Meds:. Medications Discontinued During This Encounter  Medication Reason   verapamil (CALAN-SR) 120 MG CR tablet Error   Current Meds  Medication Sig   amiodarone (PACERONE) 200 MG tablet Take 1 tablet (200 mg total) by mouth daily.   apixaban (ELIQUIS) 5 MG TABS tablet Take 1 tablet (5 mg total) by mouth 2 (two) times daily.   Cholecalciferol (VITAMIN D-3) 1000 units CAPS Take 2,000 Units by mouth daily.   fenofibrate 160 MG tablet Take 160 mg by mouth daily with supper.    insulin aspart protamine- aspart (NOVOLOG MIX 70/30) (70-30) 100 UNIT/ML injection Inject 10-70 Units into the skin See admin instructions. Inject 65 units into the skin with breakfast, 10 units with lunch,  and 35 units with supper (may adjust based on blood sugar readings)   isosorbide mononitrate (IMDUR) 30 MG 24 hr tablet Take 30 mg by mouth at bedtime.   levothyroxine (SYNTHROID, LEVOTHROID) 50 MCG tablet Take 50 mcg by mouth daily before breakfast.   metoprolol tartrate (LOPRESSOR) 50 MG tablet Take 1 tablet by mouth twice daily (Patient taking differently: Take 50 mg by mouth 2 (two) times daily. )   nitroGLYCERIN (NITROSTAT) 0.4 MG SL tablet Place 1 tablet (0.4 mg total) under the tongue every 5 (five) minutes as needed for chest pain.   nystatin cream (MYCOSTATIN) Apply 1 application topically 2 (two) times daily as needed (for irritation of affected area).   olmesartan-hydrochlorothiazide (BENICAR HCT) 40-25 MG tablet Take 1 tablet by mouth daily.   Omega-3 Fatty Acids (FISH OIL PO) Take 2,400 mg by mouth daily.   oxybutynin (DITROPAN-XL) 5 MG 24 hr tablet TAKE 1 TABLET BY MOUTH AT BEDTIME (Patient taking differently: Take 5 mg by mouth at bedtime. )   simvastatin (ZOCOR) 20 MG  tablet TAKE 1 TABLET BY MOUTH IN THE EVENING AFTER  DINNER - DISCONTINUE PRAVASTATIN - MYALGIA (Patient taking differently: Take 20 mg by mouth at bedtime. )   spironolactone (ALDACTONE) 25 MG tablet TAKE 1 TABLET BY MOUTH IN THE MORNING (Patient taking differently: Take 25 mg by mouth daily. )   torsemide (DEMADEX) 20 MG tablet Take 1 tablet (20 mg total) by mouth as needed. As needed for leg edema (Patient taking differently: Take 20 mg by mouth daily as needed (leg/feet/ankle edema). )   triamcinolone ointment (KENALOG) 0.5 % Apply 1 application topically 2 (two) times daily as needed (for itching).   vitamin B-12 (CYANOCOBALAMIN) 1000 MCG tablet Take 2,000 mcg by mouth daily.    Cardiac Studies:   Direct current cardioversion 09/07/2018:  Indication symptomatic A. Flutter. Procedure: Using 50 mg of IV Propofol and 20 IV Lidocaine (for reducing venous pain) for achieving deep sedation, synchronized direct current cardioversion performed. Patient was delivered with 120 Joules of electricity X 1 with success to A-Paced Rhythm. Patient tolerated the procedure well. No immediate complication noted.    Carotid Doppler [02/10/2014]: No evidence of hemodynamically significant stenosis in the bilateral carotid bifurcation vessels. There is mild evidence of homogeneous plaque in bilateral carotid arteries. No significant change from 10/13/2011.  Echocardiogram 07/01/2017: Left ventricle cavity is normal in size. Moderate concentric hypertrophy of the left ventricle. Normal global wall motion. Doppler evidence of grade III (restrictive) diastolic dysfunction. Diastolic dysfunction findings suggests elevated LA/LV end diastolic pressure. Calculated EF 60%. Left atrial cavity is moderately dilated at 4.6cm. Moderate (Grade II) mitral regurgitation. Mild tricuspid regurgitation. Mild pulmonary hypertension. Estimated pulmonary artery systolic pressure 36 mmHg IVC is normal with blunted respiratory  response, suggests elevated central venous pressure.  Lexiscan myoview stress test 06/15/2017: 1. Lexiscan stress test was performed. Exercise capacity was not assessed. Stress symptoms included dizziness. Peak blood pressure 158/66 mmHg. Stress EKG is non diagnostic for ischemia as it is a pharmacologic stress. In addition, it demonstrated atrial pacing and incomplete LBBB. 2. The overall quality of the study is excellent. There is no evidence of abnormal lung activity. Stress and rest SPECT images demonstrate homogeneous tracer distribution throughout the myocardium. Gated SPECT imaging reveals normal myocardial thickening and wall motion. The left ventricular ejection fraction was normal (71%). 3. Low risk study.  Sleep Study [08/11/2017]: Positive for Complex Sleep Apnea; On CPAP   R&LHC 08/31/2017: LCx: Nondominant. AV grove LCx 50-60% diffuse disease. RCA: Large dominant. Ostial PDA 50% stenosis, FFR 0.96. No change from 09/30/2011.  RA: 5 mmHg RV: 45/4 mmHg. PA: 40/12 mmHg. Mean PA 29 mmHg PCWP: 16 mmHg LV: 130/3 mmHg, LVEDP 13 mmHg  CO: 3.6 L/min. CI 1.9 L/min/m2  Assessment:     ICD-10-CM   1. Coronary artery disease involving native coronary artery of native heart without angina pectoris  I25.10 EKG 12-Lead    EKG 09/13/2018: Atrial paced rhythm at 60 bpm with first degree AV block, incomplete left bundle branch block, LVH.  EKG 08/30/2018: Atypical atrial flutter with 3:1 conduction, V rate 110 bpm, left axis deviation. LBBB.  Remote  Pacemaker transmission 03/05/2018: No mode switches. No VHR episodes. Battery longevity is 8-11.5 years. RA pacing is 99.9 %, RV pacing is 16 %Remote pacemaker check  06/11/18: There were 832 atrial high rate episodes detected. The longest lasted 0:00:12:10 in duration. There was a 0.2 % cumulative atrial arrhythmia burden. Mostly markers seen suggesting false AF. There were 0 high ventricular rate episodes detected. Health trends do not  demonstrate significant abnormality. Battery longevity is 8.5  years. RA pacing is 85.3 %, RV pacing is 13.1 %.  Scheduled In office pacemaker check 08/30/2018:  There were 6 AHR Episodes Since 08/08/2018, EGM recordings reveal maximum a rate between 180-201 bpm,, The longest His persistent since 08/30/2018,Atrial rate 180, ventricular rate 1 28 bpm, EGM's suggest atrial tachycardia.  There was 0.4% truly due to atrial arrhythmia burden, pacemaker thresholds and impedance stable.  Longevity 8.5, 12 years.  Recommendations:   Patient underwent successful cardioversion on 09/07/2018, and continues to maintain sinus rhythm.  She has been atrial paced prior EKG today.  She continues to feel tired, short of breath with minimal activity, and overall not feeling well.  Hopefully this will continue to improve with her being back in sinus rhythm.  We will closely monitor.  She will need to continue with amiodarone 200 mg daily as she is previously was on 100 mg daily and developed recurrent atrial flutter and atrial tachycardia.  I also suspect weight loss will help with this.  I have strongly encouraged her to work to lose 10 pounds by her next office visit both to reduce her risk of recurrence A. fib/flutter and also to help with improving her symptoms.  Blood pressure has remained stable.  She did not start verapamil after her last office visit, will continue to hold off for now.  No clinical evidence of decompensated heart failure.  If she continues to have worsening dyspnea, may consider repeating echocardiogram.  Has had negative chest x-ray.  I will see her back in 3 weeks for close monitoring.  Miquel Dunn, MSN, APRN, FNP-C [facility]. [place] Office: [phone] Fax: [phone]

## 2018-09-14 ENCOUNTER — Encounter: Payer: Self-pay | Admitting: Cardiology

## 2018-09-15 DIAGNOSIS — Z950 Presence of cardiac pacemaker: Secondary | ICD-10-CM | POA: Diagnosis not present

## 2018-09-15 DIAGNOSIS — Z45018 Encounter for adjustment and management of other part of cardiac pacemaker: Secondary | ICD-10-CM | POA: Diagnosis not present

## 2018-09-15 DIAGNOSIS — I495 Sick sinus syndrome: Secondary | ICD-10-CM | POA: Diagnosis not present

## 2018-09-20 DIAGNOSIS — G4733 Obstructive sleep apnea (adult) (pediatric): Secondary | ICD-10-CM | POA: Diagnosis not present

## 2018-09-21 ENCOUNTER — Telehealth: Payer: Self-pay

## 2018-09-21 NOTE — Telephone Encounter (Signed)
Pt aware of transmission  

## 2018-09-21 NOTE — Telephone Encounter (Signed)
-----   Message from Adrian Prows, MD sent at 09/17/2018  4:19 PM EDT ----- Regarding: Pacemaker Since cardioversion she is maintaining sinus rhythm.

## 2018-10-03 DIAGNOSIS — R069 Unspecified abnormalities of breathing: Secondary | ICD-10-CM | POA: Diagnosis not present

## 2018-10-03 DIAGNOSIS — I5033 Acute on chronic diastolic (congestive) heart failure: Secondary | ICD-10-CM | POA: Diagnosis not present

## 2018-10-07 HISTORY — PX: TOOTH EXTRACTION: SUR596

## 2018-10-08 ENCOUNTER — Ambulatory Visit: Payer: Medicare HMO | Admitting: Cardiology

## 2018-10-08 ENCOUNTER — Other Ambulatory Visit: Payer: Self-pay

## 2018-10-08 ENCOUNTER — Encounter: Payer: Self-pay | Admitting: Cardiology

## 2018-10-08 VITALS — BP 138/58 | HR 60 | Temp 96.9°F | Ht 62.0 in | Wt 198.9 lb

## 2018-10-08 DIAGNOSIS — I480 Paroxysmal atrial fibrillation: Secondary | ICD-10-CM | POA: Diagnosis not present

## 2018-10-08 DIAGNOSIS — R0602 Shortness of breath: Secondary | ICD-10-CM | POA: Diagnosis not present

## 2018-10-08 NOTE — Progress Notes (Signed)
Primary Physician:  Deland Pretty, MD   Patient ID: [name], female    DOB: [birth year], 73 y.o.   MRN: [id number]  Subjective:    Chief Complaint  Patient presents with   PAF   Shortness of Breath   Weight Management   Follow-up    3wk    HPI: [name]  is a 73 y.o. female  with hypertension, type 2 diabetes mellitus, nonobstructive CAD (Cath 2019), sick sinus syndrome status post dual-chamber pacemaker 2017, hyperlipidemia, stage III chronic kidney disease, OSA, paroxysmal Afib, paroxysmal atrial tachycardia.  Patient was recently seen and noted to be in Atrial flutter, she was started on amiodarone and underwent successful cardioversion on 09/07/2018. She has continued to have shortness of breath despite being back in sinus rhythm. Recommended weight loss. She now presents for follow up.    She continues to feel that she is tired and gets short of breath with minimal activity, but does feel that this is slowly improving and is now having more good days rather than bad days. She has not noted any heart racing or palpitations. Has occasional leg edema that improves with use of torsemide.  Overall, denies any chest pain, orthopnea, PND, presncope, syncope, TIA. Unfortunately, she has not lost weight.   She is under a lot of stress in caring for her husband and also now her daughter that recently had CVA.    Past Medical History:  Diagnosis Date   Arthritis    Diabetes mellitus without complication ([place])    Encounter for care of pacemaker 09/03/2018   History of chickenpox    Hx of psoriatic arthritis    Hyperlipemia    Hypertension    Hypertension 05/03/2018   Mitral valve disorders(424.0)    Obstructive sleep apnea    Pacemaker    Paroxysmal atrial fibrillation ([place]) 10/11/2016   Peripheral neuropathy    Renal disorder Kidney disease stage2   Thyroid disease hypothyroid   Urine incontinence    Vitamin D deficiency     Past Surgical  History:  Procedure Laterality Date   CARDIAC CATHETERIZATION N/A 04/29/2015   Procedure: Temporary Pacemaker;  Surgeon: Adrian Prows, MD;  Location: [facility];  Service: Cardiovascular;  Laterality: N/A;   CARDIOVERSION N/A 09/07/2018   Procedure: CARDIOVERSION;  Surgeon: Adrian Prows, MD;  Location: [facility];  Service: Cardiovascular;  Laterality: N/A;   CARDIOVERSION     EP IMPLANTABLE DEVICE N/A 04/30/2015   Procedure: Pacemaker Implant;  Surgeon: Evans Lance, MD;  Location: [facility];  Service: Cardiovascular;  Laterality: N/A;   INTRAVASCULAR PRESSURE WIRE/FFR STUDY N/A 08/31/2017   Procedure: INTRAVASCULAR PRESSURE WIRE/FFR STUDY;  Surgeon: Nigel Mormon, MD;  Location: [facility];  Service: Cardiovascular;  Laterality: N/A;   LEFT AND RIGHT HEART CATHETERIZATION WITH CORONARY ANGIOGRAM N/A 09/30/2011   Procedure: LEFT AND RIGHT HEART CATHETERIZATION WITH CORONARY ANGIOGRAM;  Surgeon: Laverda Page, MD;  Location: [facility];  Service: Cardiovascular;  Laterality: N/A;   RIGHT/LEFT HEART CATH AND CORONARY ANGIOGRAPHY N/A 08/31/2017   Procedure: RIGHT/LEFT HEART CATH AND CORONARY ANGIOGRAPHY;  Surgeon: Nigel Mormon, MD;  Location: [facility];  Service: Cardiovascular;  Laterality: N/A;   TOOTH EXTRACTION  [id number]   YAG LASER APPLICATION Right [id number]   Procedure: YAG LASER APPLICATION;  Surgeon: Elta Guadeloupe T. Gershon Crane, MD;  Location: [facility];  Service: Ophthalmology;  Laterality: Right;  pt knows to arrive at 11:15  YAG LASER APPLICATION Left [id number]   Procedure: YAG LASER APPLICATION;  Surgeon: Rutherford Guys, MD;  Location: [facility];  Service: Ophthalmology;  Laterality: Left;    Social History   Socioeconomic History   Marital status: Married    Spouse name: Not on file   Number of children: 3   Years of education: Not on file   Highest education level: Not on file  Occupational History   Not on file  Social Needs     Financial resource strain: Not on file   Food insecurity    Worry: Not on file    Inability: Not on file   Transportation needs    Medical: Not on file    Non-medical: Not on file  Tobacco Use   Smoking status: Never Smoker   Smokeless tobacco: Never Used  Substance and Sexual Activity   Alcohol use: No    Alcohol/week: 0.0 standard drinks   Drug use: No   Sexual activity: Not Currently  Lifestyle   Physical activity    Days per week: Not on file    Minutes per session: Not on file   Stress: Not on file  Relationships   Social connections    Talks on phone: Not on file    Gets together: Not on file    Attends religious service: Not on file    Active member of club or organization: Not on file    Attends meetings of clubs or organizations: Not on file    Relationship status: Not on file   Intimate partner violence    Fear of current or ex partner: Not on file    Emotionally abused: Not on file    Physically abused: Not on file    Forced sexual activity: Not on file  Other Topics Concern   Not on file  Social History Narrative   Not on file    Review of Systems  Constitution: Positive for malaise/fatigue. Negative for chills, decreased appetite and weight gain.  Cardiovascular: Positive for dyspnea on exertion (with minimal activity). Negative for chest pain, claudication, leg swelling, orthopnea, palpitations and syncope.  Respiratory: Negative for cough, hemoptysis and wheezing.   Endocrine: Negative for cold intolerance.  Hematologic/Lymphatic: Does not bruise/bleed easily.  Musculoskeletal: Negative for arthritis and joint swelling.  Gastrointestinal: Negative for abdominal pain, anorexia and change in bowel habit.  Neurological: Negative for headaches and light-headedness.  Psychiatric/Behavioral: Negative for depression and substance abuse.  All other systems reviewed and are negative.     Objective:  Blood pressure (!) 162/57, pulse 60,  temperature (!) 96.9 F (36.1 C), height 5\' 2"  (1.575 m), weight 198 lb 14.4 oz (90.2 kg), SpO2 95 %. Body mass index is 36.38 kg/m.  Physical Exam  Constitutional: She appears well-developed and well-nourished. No distress.  HENT:  Head: Atraumatic.  Eyes: Conjunctivae are normal.  Neck: Neck supple. No JVD present. No thyromegaly present.  Cardiovascular: Normal rate, regular rhythm, S1 normal, S2 normal and intact distal pulses. Exam reveals no gallop.  Murmur heard. High-pitched blowing holosystolic murmur is present with a grade of 2/6 at the apex. Pulses:      Carotid pulses are 2+ on the right side and 2+ on the left side with bruit.      Popliteal pulses are 1+ on the right side and 1+ on the left side.       Dorsalis pedis pulses are 1+ on the right side and 1+ on the left side.  Posterior tibial pulses are 1+ on the right side and 1+ on the left side.  II/VI SEM in the right parasternal border  Pulmonary/Chest: Effort normal and breath sounds normal.  Left infraclavicular pacemaker pocket noted  Abdominal: Soft. Bowel sounds are normal.  Musculoskeletal: Normal range of motion.        General: No edema.  Neurological: She is alert.  Skin: Skin is warm and dry.  Psychiatric: She has a normal mood and affect.   Radiology: No results found.  Laboratory examination:    CMP Latest Ref Rng & Units 09/07/2018 08/31/2018 06/30/2018  Glucose 70 - 99 mg/dL 149(H) 146(H) 119(H)  BUN 8 - 23 mg/dL 30(H) 24 19  Creatinine 0.44 - 1.00 mg/dL 1.50(H) 1.39(H) 1.29(H)  Sodium 135 - 145 mmol/L 139 139 140  Potassium 3.5 - 5.1 mmol/L 4.9 5.2 4.2  Chloride 98 - 111 mmol/L 99 100 104  CO2 20 - 29 mmol/L - 25 26  Calcium 8.7 - 10.3 mg/dL - 10.5(H) 10.1  Total Protein 6.5 - 8.1 g/dL - - 7.6  Total Bilirubin 0.3 - 1.2 mg/dL - - 0.3  Alkaline Phos 38 - 126 U/L - - 29(L)  AST 15 - 41 U/L - - 25  ALT 0 - 44 U/L - - 23   CBC Latest Ref Rng & Units 09/07/2018 06/30/2018 10/24/2017  WBC  4.0 - 10.5 K/uL - 6.3 8.2  Hemoglobin 12.0 - 15.0 g/dL 15.3(H) 13.7 12.1  Hematocrit 36.0 - 46.0 % 45.0 43.6 38.6  Platelets 150 - 400 K/uL - 152 128(L)   Lipid Panel     Component Value Date/Time   CHOL 168 08/29/2017 0328   TRIG 544 (H) 08/29/2017 0328   HDL 19 (L) 08/29/2017 0328   CHOLHDL 8.8 08/29/2017 0328   VLDL UNABLE TO CALCULATE IF TRIGLYCERIDE OVER 400 mg/dL 08/29/2017 0328   LDLCALC UNABLE TO CALCULATE IF TRIGLYCERIDE OVER 400 mg/dL 08/29/2017 0328   HEMOGLOBIN A1C Lab Results  Component Value Date   HGBA1C 7.3 (H) 09/30/2015   MPG 163 09/30/2015   TSH No results for input(s): TSH in the last 8760 hours.  PRN Meds:. There are no discontinued medications. Current Meds  Medication Sig   amiodarone (PACERONE) 200 MG tablet Take 1 tablet (200 mg total) by mouth daily.   apixaban (ELIQUIS) 5 MG TABS tablet Take 1 tablet (5 mg total) by mouth 2 (two) times daily.   Cholecalciferol (VITAMIN D-3) 1000 units CAPS Take 2,000 Units by mouth daily.   fenofibrate 160 MG tablet Take 160 mg by mouth daily with supper.    insulin aspart protamine- aspart (NOVOLOG MIX 70/30) (70-30) 100 UNIT/ML injection Inject 10-70 Units into the skin See admin instructions. Inject 65 units into the skin with breakfast, 40 units with supper (may adjust based on blood sugar readings)   isosorbide mononitrate (IMDUR) 30 MG 24 hr tablet Take 30 mg by mouth at bedtime.   levothyroxine (SYNTHROID, LEVOTHROID) 50 MCG tablet Take 50 mcg by mouth daily before breakfast.   metoprolol tartrate (LOPRESSOR) 50 MG tablet Take 1 tablet by mouth twice daily (Patient taking differently: Take 50 mg by mouth 2 (two) times daily. )   nitroGLYCERIN (NITROSTAT) 0.4 MG SL tablet Place 1 tablet (0.4 mg total) under the tongue every 5 (five) minutes as needed for chest pain.   nystatin cream (MYCOSTATIN) Apply 1 application topically 2 (two) times daily as needed (for irritation of affected area).    olmesartan-hydrochlorothiazide (BENICAR HCT) 40-25  MG tablet Take 1 tablet by mouth daily.   Omega-3 Fatty Acids (FISH OIL PO) Take 2,400 mg by mouth daily.   oxybutynin (DITROPAN-XL) 5 MG 24 hr tablet TAKE 1 TABLET BY MOUTH AT BEDTIME (Patient taking differently: Take 5 mg by mouth at bedtime. )   simvastatin (ZOCOR) 20 MG tablet TAKE 1 TABLET BY MOUTH IN THE EVENING AFTER DINNER - DISCONTINUE PRAVASTATIN - MYALGIA (Patient taking differently: Take 20 mg by mouth at bedtime. )   spironolactone (ALDACTONE) 25 MG tablet TAKE 1 TABLET BY MOUTH IN THE MORNING (Patient taking differently: Take 25 mg by mouth daily. )   torsemide (DEMADEX) 20 MG tablet Take 1 tablet (20 mg total) by mouth as needed. As needed for leg edema (Patient taking differently: Take 20 mg by mouth daily as needed (leg/feet/ankle edema). )   triamcinolone ointment (KENALOG) 0.5 % Apply 1 application topically 2 (two) times daily as needed (for itching).   vitamin B-12 (CYANOCOBALAMIN) 1000 MCG tablet Take 2,000 mcg by mouth daily.    Cardiac Studies:   Direct current cardioversion 09/07/2018:  Indication symptomatic A. Flutter. Procedure: Using 50 mg of IV Propofol and 20 IV Lidocaine (for reducing venous pain) for achieving deep sedation, synchronized direct current cardioversion performed. Patient was delivered with 120 Joules of electricity X 1 with success to A-Paced Rhythm. Patient tolerated the procedure well. No immediate complication noted.    Carotid Doppler [02/10/2014]: No evidence of hemodynamically significant stenosis in the bilateral carotid bifurcation vessels. There is mild evidence of homogeneous plaque in bilateral carotid arteries. No significant change from 10/13/2011.  Echocardiogram 07/01/2017: Left ventricle cavity is normal in size. Moderate concentric hypertrophy of the left ventricle. Normal global wall motion. Doppler evidence of grade III (restrictive) diastolic dysfunction. Diastolic  dysfunction findings suggests elevated LA/LV end diastolic pressure. Calculated EF 60%. Left atrial cavity is moderately dilated at 4.6cm. Moderate (Grade II) mitral regurgitation. Mild tricuspid regurgitation. Mild pulmonary hypertension. Estimated pulmonary artery systolic pressure 36 mmHg IVC is normal with blunted respiratory response, suggests elevated central venous pressure.  Lexiscan myoview stress test 06/15/2017: 1. Lexiscan stress test was performed. Exercise capacity was not assessed. Stress symptoms included dizziness. Peak blood pressure 158/66 mmHg. Stress EKG is non diagnostic for ischemia as it is a pharmacologic stress. In addition, it demonstrated atrial pacing and incomplete LBBB. 2. The overall quality of the study is excellent. There is no evidence of abnormal lung activity. Stress and rest SPECT images demonstrate homogeneous tracer distribution throughout the myocardium. Gated SPECT imaging reveals normal myocardial thickening and wall motion. The left ventricular ejection fraction was normal (71%). 3. Low risk study.  Sleep Study [08/11/2017]: Positive for Complex Sleep Apnea; On CPAP   R&LHC 08/31/2017: LCx: Nondominant. AV grove LCx 50-60% diffuse disease. RCA: Large dominant. Ostial PDA 50% stenosis, FFR 0.96. No change from 09/30/2011.  RA: 5 mmHg RV: 45/4 mmHg. PA: 40/12 mmHg. Mean PA 29 mmHg PCWP: 16 mmHg LV: 130/3 mmHg, LVEDP 13 mmHg  CO: 3.6 L/min. CI 1.9 L/min/m2  Assessment:     ICD-10-CM   1. Paroxysmal atrial fibrillation (HCC)  I48.0 EKG 12-Lead    EKG 10/08/2018: Atrial paced rhythm at 60 bpm with first degree AV block, incomplete left bundle branch block, LVH.  EKG 08/30/2018: Atypical atrial flutter with 3:1 conduction, V rate 110 bpm, left axis deviation. LBBB.  Remote  Pacemaker transmission 03/05/2018: No mode switches. No VHR episodes. Battery longevity is 8-11.5 years. RA pacing is 99.9 %, RV pacing  is 16 %Remote pacemaker check   06/11/18: There were 832 atrial high rate episodes detected. The longest lasted 0:00:12:10 in duration. There was a 0.2 % cumulative atrial arrhythmia burden. Mostly markers seen suggesting false AF. There were 0 high ventricular rate episodes detected. Health trends do not demonstrate significant abnormality. Battery longevity is 8.5 years. RA pacing is 85.3 %, RV pacing is 13.1 %.  Scheduled In office pacemaker check 08/30/2018:  There were 6 AHR Episodes Since 08/08/2018, EGM recordings reveal maximum a rate between 180-201 bpm,, The longest His persistent since 08/30/2018,Atrial rate 180, ventricular rate 1 28 bpm, EGM's suggest atrial tachycardia.  There was 0.4% truly due to atrial arrhythmia burden, pacemaker thresholds and impedance stable.  Longevity 8.5, 12 years.  Recommendations:   Patient continues to have dyspnea on exertion and fatigue with minimal exertion, but does feel that this is improving.  States that this is better than prior to her cardioversion.  I continue to feel that this is related to her weight.  Has had low risk stress test 1 year ago.  Given her continued persistent symptoms, I will obtain echocardiogram for further evaluation.  Clinically, no evidence of heart failure.  She is on appropriate medical therapy, blood pressure is well controlled.  Continues to maintain atrial paced rhythm, no known recurrence of A. fib.  She will continue to need 200 mg of amiodarone as she previously had recurrence of a flutter with lower dose amiodarone.  I have again counseled her on the importance of weight loss with diet modifications and starting regular exercise.  Her symptoms could also be potentially related to deconditioning.  No changes were made to her medications today.  I will see her back in 6 weeks for follow-up, but encouraged her to contact me sooner if needed.   Miquel Dunn, MSN, APRN, FNP-C [facility]. [place] Office: [phone] Fax: [phone]

## 2018-10-17 ENCOUNTER — Other Ambulatory Visit: Payer: Self-pay | Admitting: Adult Health

## 2018-10-17 ENCOUNTER — Other Ambulatory Visit: Payer: Self-pay | Admitting: Cardiology

## 2018-10-20 DIAGNOSIS — G4733 Obstructive sleep apnea (adult) (pediatric): Secondary | ICD-10-CM | POA: Diagnosis not present

## 2018-10-25 ENCOUNTER — Other Ambulatory Visit: Payer: Medicare HMO

## 2018-11-02 ENCOUNTER — Other Ambulatory Visit: Payer: Self-pay | Admitting: Cardiology

## 2018-11-02 DIAGNOSIS — R069 Unspecified abnormalities of breathing: Secondary | ICD-10-CM | POA: Diagnosis not present

## 2018-11-02 DIAGNOSIS — I5033 Acute on chronic diastolic (congestive) heart failure: Secondary | ICD-10-CM | POA: Diagnosis not present

## 2018-11-03 ENCOUNTER — Ambulatory Visit (INDEPENDENT_AMBULATORY_CARE_PROVIDER_SITE_OTHER): Payer: Medicare HMO

## 2018-11-03 ENCOUNTER — Other Ambulatory Visit: Payer: Self-pay

## 2018-11-03 DIAGNOSIS — R0602 Shortness of breath: Secondary | ICD-10-CM | POA: Diagnosis not present

## 2018-11-08 NOTE — Progress Notes (Signed)
Pt aware.

## 2018-11-19 ENCOUNTER — Ambulatory Visit: Payer: Medicare HMO | Admitting: Cardiology

## 2018-11-19 ENCOUNTER — Encounter: Payer: Self-pay | Admitting: Cardiology

## 2018-11-19 ENCOUNTER — Other Ambulatory Visit: Payer: Self-pay

## 2018-11-19 VITALS — BP 138/51 | HR 60 | Temp 97.6°F | Ht 62.0 in | Wt 202.8 lb

## 2018-11-19 DIAGNOSIS — I480 Paroxysmal atrial fibrillation: Secondary | ICD-10-CM | POA: Diagnosis not present

## 2018-11-19 DIAGNOSIS — R0600 Dyspnea, unspecified: Secondary | ICD-10-CM | POA: Diagnosis not present

## 2018-11-19 DIAGNOSIS — R0609 Other forms of dyspnea: Secondary | ICD-10-CM

## 2018-11-19 DIAGNOSIS — I5032 Chronic diastolic (congestive) heart failure: Secondary | ICD-10-CM | POA: Diagnosis not present

## 2018-11-19 DIAGNOSIS — R5383 Other fatigue: Secondary | ICD-10-CM

## 2018-11-19 DIAGNOSIS — Z950 Presence of cardiac pacemaker: Secondary | ICD-10-CM

## 2018-11-19 DIAGNOSIS — E668 Other obesity: Secondary | ICD-10-CM | POA: Diagnosis not present

## 2018-11-19 NOTE — Progress Notes (Signed)
Primary Physician:  Deland Pretty, MD   Patient ID: [name], female    DOB: 10-15-1945, 73 y.o.   MRN: [id number]  Subjective:    Chief Complaint  Patient presents with  . Shortness of Breath  . Fatigue  . Follow-up    HPI: [name]  is a 73 y.o. female  with hypertension, type 2 diabetes mellitus, nonobstructive CAD (Cath 2019), sick sinus syndrome status post dual-chamber pacemaker 2017, hyperlipidemia, stage III chronic kidney disease, OSA, paroxysmal Afib, paroxysmal atrial tachycardia.  Patient was recently seen and noted to be in Atrial flutter, she was started on amiodarone and underwent successful cardioversion on 09/07/2018. She has continued to have shortness of breath despite being back in sinus rhythm. Recommended weight loss.   I have been seeing the patient frequently due to complaints of dyspnea on exertion and fatigue with minimal activity.  She reports symptoms started prior to her cardioversion, but have not improved since the procedure.  She has been extensively counseled on the importance of weight loss as I felt that this was likely contributing to her symptoms, but has been unsuccessful in losing weight.  She has not had any exertional chest pain or chest pain at rest.  No PND or orthopnea.  No leg swelling.  She is under a lot of stress in caring for her husband that is disabled and also now her daughter that recently had CVA.   Past Medical History:  Diagnosis Date  . Arthritis   . Diabetes mellitus without complication ([place])   . Encounter for care of pacemaker 09/03/2018  . History of chickenpox   . Hx of psoriatic arthritis   . Hyperlipemia   . Hypertension   . Hypertension 05/03/2018  . Mitral valve disorders(424.0)   . Obstructive sleep apnea   . Pacemaker   . Paroxysmal atrial fibrillation ([place]) 10/11/2016  . Peripheral neuropathy   . Renal disorder Kidney disease stage2  . Thyroid disease hypothyroid  . Urine incontinence   .  Vitamin D deficiency     Past Surgical History:  Procedure Laterality Date  . CARDIAC CATHETERIZATION N/A 04/29/2015   Procedure: Temporary Pacemaker;  Surgeon: Adrian Prows, MD;  Location: [facility];  Service: Cardiovascular;  Laterality: N/A;  . CARDIOVERSION N/A 09/07/2018   Procedure: CARDIOVERSION;  Surgeon: Adrian Prows, MD;  Location: [place];  Service: Cardiovascular;  Laterality: N/A;  . CARDIOVERSION    . EP IMPLANTABLE DEVICE N/A 04/30/2015   Procedure: Pacemaker Implant;  Surgeon: Evans Lance, MD;  Location: [facility];  Service: Cardiovascular;  Laterality: N/A;  . INTRAVASCULAR PRESSURE WIRE/FFR STUDY N/A 08/31/2017   Procedure: INTRAVASCULAR PRESSURE WIRE/FFR STUDY;  Surgeon: Nigel Mormon, MD;  Location: [place] [facility];  Service: Cardiovascular;  Laterality: N/A;  . LEFT AND RIGHT HEART CATHETERIZATION WITH CORONARY ANGIOGRAM N/A 09/30/2011   Procedure: LEFT AND RIGHT HEART CATHETERIZATION WITH CORONARY ANGIOGRAM;  Surgeon: Laverda Page, MD;  Location: [facility];  Service: Cardiovascular;  Laterality: N/A;  . RIGHT/LEFT HEART CATH AND CORONARY ANGIOGRAPHY N/A 08/31/2017   Procedure: RIGHT/LEFT HEART CATH AND CORONARY ANGIOGRAPHY;  Surgeon: Nigel Mormon, MD;  Location: [place] [facility];  Service: Cardiovascular;  Laterality: N/A;  . TOOTH EXTRACTION  10/07/2018  . YAG LASER APPLICATION Right [id number]   Procedure: YAG LASER APPLICATION;  Surgeon: Elta Guadeloupe T. Gershon Crane, MD;  Location: [facility];  Service: Ophthalmology;  Laterality: Right;  pt knows to arrive at  11:15  . YAG LASER APPLICATION Left [id number]   Procedure: YAG LASER APPLICATION;  Surgeon: Rutherford Guys, MD;  Location: [facility];  Service: Ophthalmology;  Laterality: Left;    Social History   Socioeconomic History  . Marital status: Married    Spouse name: Not on file  . Number of children: 3  . Years of education: Not on file  . Highest education level: Not on file  Occupational  History  . Not on file  Social Needs  . Financial resource strain: Not on file  . Food insecurity    Worry: Not on file    Inability: Not on file  . Transportation needs    Medical: Not on file    Non-medical: Not on file  Tobacco Use  . Smoking status: Never Smoker  . Smokeless tobacco: Never Used  Substance and Sexual Activity  . Alcohol use: No    Alcohol/week: 0.0 standard drinks  . Drug use: No  . Sexual activity: Not Currently  Lifestyle  . Physical activity    Days per week: Not on file    Minutes per session: Not on file  . Stress: Not on file  Relationships  . Social Herbalist on phone: Not on file    Gets together: Not on file    Attends religious service: Not on file    Active member of club or organization: Not on file    Attends meetings of clubs or organizations: Not on file    Relationship status: Not on file  . Intimate partner violence    Fear of current or ex partner: Not on file    Emotionally abused: Not on file    Physically abused: Not on file    Forced sexual activity: Not on file  Other Topics Concern  . Not on file  Social History Narrative  . Not on file    Review of Systems  Constitution: Positive for malaise/fatigue. Negative for chills, decreased appetite and weight gain.  Cardiovascular: Positive for dyspnea on exertion (with minimal activity). Negative for chest pain, claudication, leg swelling, orthopnea, palpitations and syncope.  Respiratory: Negative for cough, hemoptysis and wheezing.   Endocrine: Negative for cold intolerance.  Hematologic/Lymphatic: Does not bruise/bleed easily.  Musculoskeletal: Negative for arthritis and joint swelling.  Gastrointestinal: Negative for abdominal pain, anorexia and change in bowel habit.  Neurological: Negative for headaches and light-headedness.  Psychiatric/Behavioral: Negative for depression and substance abuse.  All other systems reviewed and are negative.     Objective:   Blood pressure (!) 138/51, pulse 60, temperature 97.6 F (36.4 C), height 5\' 2"  (1.575 m), weight 202 lb 12.8 oz (92 kg), SpO2 91 %. Body mass index is 37.09 kg/m.  Physical Exam  Constitutional: She appears well-developed and well-nourished. No distress.  HENT:  Head: Atraumatic.  Eyes: Conjunctivae are normal.  Neck: Neck supple. No JVD present. No thyromegaly present.  Cardiovascular: Normal rate, regular rhythm, S1 normal, S2 normal and intact distal pulses. Exam reveals no gallop.  Murmur heard. High-pitched blowing holosystolic murmur is present with a grade of 2/6 at the apex. Pulses:      Carotid pulses are 2+ on the right side and 2+ on the left side with bruit.      Popliteal pulses are 1+ on the right side and 1+ on the left side.       Dorsalis pedis pulses are 1+ on the right side and 1+ on the left side.  Posterior tibial pulses are 1+ on the right side and 1+ on the left side.  II/VI SEM in the right parasternal border  Pulmonary/Chest: Effort normal and breath sounds normal.  Left infraclavicular pacemaker pocket noted  Abdominal: Soft. Bowel sounds are normal.  Musculoskeletal: Normal range of motion.        General: No edema.  Neurological: She is alert.  Skin: Skin is warm and dry.  Psychiatric: She has a normal mood and affect.   Radiology: No results found.  Laboratory examination:    CMP Latest Ref Rng & Units 09/07/2018 08/31/2018 06/30/2018  Glucose 70 - 99 mg/dL 149(H) 146(H) 119(H)  BUN 8 - 23 mg/dL 30(H) 24 19  Creatinine 0.44 - 1.00 mg/dL 1.50(H) 1.39(H) 1.29(H)  Sodium 135 - 145 mmol/L 139 139 140  Potassium 3.5 - 5.1 mmol/L 4.9 5.2 4.2  Chloride 98 - 111 mmol/L 99 100 104  CO2 20 - 29 mmol/L - 25 26  Calcium 8.7 - 10.3 mg/dL - 10.5(H) 10.1  Total Protein 6.5 - 8.1 g/dL - - 7.6  Total Bilirubin 0.3 - 1.2 mg/dL - - 0.3  Alkaline Phos 38 - 126 U/L - - 29(L)  AST 15 - 41 U/L - - 25  ALT 0 - 44 U/L - - 23   CBC Latest Ref Rng & Units  09/07/2018 06/30/2018 10/24/2017  WBC 4.0 - 10.5 K/uL - 6.3 8.2  Hemoglobin 12.0 - 15.0 g/dL 15.3(H) 13.7 12.1  Hematocrit 36.0 - 46.0 % 45.0 43.6 38.6  Platelets 150 - 400 K/uL - 152 128(L)   Lipid Panel     Component Value Date/Time   CHOL 168 08/29/2017 0328   TRIG 544 (H) 08/29/2017 0328   HDL 19 (L) 08/29/2017 0328   CHOLHDL 8.8 08/29/2017 0328   VLDL UNABLE TO CALCULATE IF TRIGLYCERIDE OVER 400 mg/dL 08/29/2017 0328   LDLCALC UNABLE TO CALCULATE IF TRIGLYCERIDE OVER 400 mg/dL 08/29/2017 0328   HEMOGLOBIN A1C Lab Results  Component Value Date   HGBA1C 7.3 (H) 09/30/2015   MPG 163 09/30/2015   TSH No results for input(s): TSH in the last 8760 hours.  PRN Meds:. Medications Discontinued During This Encounter  Medication Reason  . simvastatin (ZOCOR) 20 MG tablet Discontinued by provider   Current Meds  Medication Sig  . amiodarone (PACERONE) 200 MG tablet Take 1 tablet (200 mg total) by mouth daily.  Marland Kitchen apixaban (ELIQUIS) 5 MG TABS tablet Take 1 tablet (5 mg total) by mouth 2 (two) times daily.  . Cholecalciferol (VITAMIN D-3) 1000 units CAPS Take 2,000 Units by mouth daily.  . fenofibrate 160 MG tablet Take 160 mg by mouth daily with supper.   . insulin aspart protamine- aspart (NOVOLOG MIX 70/30) (70-30) 100 UNIT/ML injection Inject 10-70 Units into the skin See admin instructions. Inject 65 units into the skin with breakfast, 40 units with supper (may adjust based on blood sugar readings)  . isosorbide mononitrate (IMDUR) 30 MG 24 hr tablet Take 1 tablet by mouth once daily  . levothyroxine (SYNTHROID, LEVOTHROID) 50 MCG tablet Take 50 mcg by mouth daily before breakfast.  . metoprolol tartrate (LOPRESSOR) 50 MG tablet Take 1 tablet by mouth twice daily (Patient taking differently: Take 50 mg by mouth 2 (two) times daily. )  . nitroGLYCERIN (NITROSTAT) 0.4 MG SL tablet Place 1 tablet (0.4 mg total) under the tongue every 5 (five) minutes as needed for chest pain.  Marland Kitchen  nystatin cream (MYCOSTATIN) Apply 1 application topically 2 (two)  times daily as needed (for irritation of affected area).  . olmesartan-hydrochlorothiazide (BENICAR HCT) 40-25 MG tablet Take 1 tablet by mouth daily.  . Omega-3 Fatty Acids (FISH OIL PO) Take 2,400 mg by mouth daily.  Marland Kitchen oxybutynin (DITROPAN-XL) 5 MG 24 hr tablet TAKE 1 TABLET BY MOUTH AT BEDTIME  . spironolactone (ALDACTONE) 25 MG tablet Take 1 tablet (25 mg total) by mouth daily.  Marland Kitchen torsemide (DEMADEX) 20 MG tablet Take 1 tablet (20 mg total) by mouth as needed. As needed for leg edema (Patient taking differently: Take 20 mg by mouth daily as needed (leg/feet/ankle edema). )  . triamcinolone ointment (KENALOG) 0.5 % Apply 1 application topically 2 (two) times daily as needed (for itching).  . vitamin B-12 (CYANOCOBALAMIN) 1000 MCG tablet Take 2,000 mcg by mouth daily.  . [DISCONTINUED] simvastatin (ZOCOR) 20 MG tablet TAKE 1 TABLET BY MOUTH IN THE EVENING AFTER DINNER - DISCONTINUE PRAVASTATIN - MYALGIA (Patient taking differently: Take 20 mg by mouth at bedtime. )    Cardiac Studies:   Echocardiogram 11/03/2018: Left ventricle cavity is normal in size. Moderate concentric hypertrophy of the left ventricle. Normal LV systolic function with EF 56%. Normal global wall motion. Unable to evaluate diastolic function due to E/A fusion.  Left atrial cavity is severely dilated. Trace aortic regurgitation. Moderate (Grade III) mitral regurgitation. Mild mitral valve leaflet thickening. Moderate tricuspid regurgitation. Estimated pulmonary artery systolic pressure is 40 mmHg.  No significant change compared to previous study on 07/01/2017.  Direct current cardioversion 09/07/2018:  Indication symptomatic A. Flutter. Procedure: Using 50 mg of IV Propofol and 20 IV Lidocaine (for reducing venous pain) for achieving deep sedation, synchronized direct current cardioversion performed. Patient was delivered with 120 Joules of electricity X 1  with success to A-Paced Rhythm. Patient tolerated the procedure well. No immediate complication noted.    Carotid Doppler [02/10/2014]: No evidence of hemodynamically significant stenosis in the bilateral carotid bifurcation vessels. There is mild evidence of homogeneous plaque in bilateral carotid arteries. No significant change from 10/13/2011.  Lexiscan myoview stress test 06/15/2017: 1. Lexiscan stress test was performed. Exercise capacity was not assessed. Stress symptoms included dizziness. Peak blood pressure 158/66 mmHg. Stress EKG is non diagnostic for ischemia as it is a pharmacologic stress. In addition, it demonstrated atrial pacing and incomplete LBBB. 2. The overall quality of the study is excellent. There is no evidence of abnormal lung activity. Stress and rest SPECT images demonstrate homogeneous tracer distribution throughout the myocardium. Gated SPECT imaging reveals normal myocardial thickening and wall motion. The left ventricular ejection fraction was normal (71%). 3. Low risk study.  Sleep Study [08/11/2017]: Positive for Complex Sleep Apnea; On CPAP   R&LHC 08/31/2017: LCx: Nondominant. AV grove LCx 50-60% diffuse disease. RCA: Large dominant. Ostial PDA 50% stenosis, FFR 0.96. No change from 09/30/2011.  RA: 5 mmHg RV: 45/4 mmHg. PA: 40/12 mmHg. Mean PA 29 mmHg PCWP: 16 mmHg LV: 130/3 mmHg, LVEDP 13 mmHg  CO: 3.6 L/min. CI 1.9 L/min/m2  Assessment:     ICD-10-CM   1. Dyspnea on exertion  R06.00 Ambulatory referral to Pulmonology  2. Paroxysmal atrial fibrillation (HCC)  I48.0 EKG 12-Lead  3. Chronic diastolic heart failure (HCC)  I50.32   4. Moderate obesity  E66.8   5. Fatigue, unspecified type  R53.83   6. Cardiac pacemaker in situ on 04/30/2015 [employer]onic Adapta Dr Peggyann Juba; Leads only MRI compatible: Sinus node dysfunction  Z95.0     EKG 10/08/2018: Atrial paced rhythm at 60  bpm with first degree AV block, incomplete left bundle branch block, LVH.   EKG 08/30/2018: Atypical atrial flutter with 3:1 conduction, V rate 110 bpm, left axis deviation. LBBB.  Remote  Pacemaker transmission 03/05/2018: No mode switches. No VHR episodes. Battery longevity is 8-11.5 years. RA pacing is 99.9 %, RV pacing is 16 %Remote pacemaker check  06/11/18: There were 832 atrial high rate episodes detected. The longest lasted 0:00:12:10 in duration. There was a 0.2 % cumulative atrial arrhythmia burden. Mostly markers seen suggesting false AF. There were 0 high ventricular rate episodes detected. Health trends do not demonstrate significant abnormality. Battery longevity is 8.5 years. RA pacing is 85.3 %, RV pacing is 13.1 %.  Scheduled In office pacemaker check 08/30/2018:  There were 6 AHR Episodes Since 08/08/2018, EGM recordings reveal maximum a rate between 180-201 bpm,, The longest His persistent since 08/30/2018,Atrial rate 180, ventricular rate 1 28 bpm, EGM's suggest atrial tachycardia.  There was 0.4% truly due to atrial arrhythmia burden, pacemaker thresholds and impedance stable.  Longevity 8.5, 12 years.  Recommendations:   I have been seeing the patient frequently due to fatigue and dyspnea on exertion with minimal activity.  She previously had reported that she felt that this may be improving, but today states that her symptoms are worse particularly since her cardioversion.  I discussed recently obtained echocardiogram that is unchanged compared to 1 year ago.  I suspect her symptoms are likely multifactorial from diastolic dysfunction, obesity, deconditioning.  She is underwent coronary angiogram in August 2019 that revealed nonobstructive CAD that was unchanged from 2013.  She has not had any chest pain.  She does mention that she is now having to use her oxygen that she uses at night occasionally during the day to get some rest.  She has had unyielding chest x-ray in August since being on higher dose of amiodarone.  I would like to get an opinion from  pulmonary for possible etiology of her dyspnea, although I do feel it is likely again multifactorial.  We will see if she is a candidate for potentially cardiac or pulmonary rehab to help with her deconditioning.  She is complaining of weakness in her legs, will have her hold her simvastatin for 2 weeks for statin holiday to see if this is contributing to her symptoms.  She is on amiodarone 200 mg without recurrence of atrial flutter.  Continues to show atrial paced rhythm on EKG.  She has previously had recurrence of atrial flutter with lower dose of amiodarone, hence would recommend continuing at 200 mg daily.  We will see her back in 6 weeks for follow-up on her symptoms.  I have encouraged her to work on weight loss with diet modifications.   Miquel Dunn, MSN, APRN, FNP-C [facility]. [place] Office: [phone] Fax: [phone]

## 2018-11-20 DIAGNOSIS — G4733 Obstructive sleep apnea (adult) (pediatric): Secondary | ICD-10-CM | POA: Diagnosis not present

## 2018-11-21 ENCOUNTER — Other Ambulatory Visit: Payer: Self-pay | Admitting: Cardiology

## 2018-11-22 DIAGNOSIS — E119 Type 2 diabetes mellitus without complications: Secondary | ICD-10-CM | POA: Diagnosis not present

## 2018-11-22 DIAGNOSIS — Z794 Long term (current) use of insulin: Secondary | ICD-10-CM | POA: Diagnosis not present

## 2018-11-24 ENCOUNTER — Other Ambulatory Visit: Payer: Self-pay

## 2018-11-24 MED ORDER — OLMESARTAN MEDOXOMIL-HCTZ 40-25 MG PO TABS: 1.0000 | ORAL_TABLET | Freq: Every day | ORAL | 3 refills | Status: DC

## 2018-12-01 DIAGNOSIS — G4733 Obstructive sleep apnea (adult) (pediatric): Secondary | ICD-10-CM | POA: Diagnosis not present

## 2018-12-03 DIAGNOSIS — I5033 Acute on chronic diastolic (congestive) heart failure: Secondary | ICD-10-CM | POA: Diagnosis not present

## 2018-12-03 DIAGNOSIS — R069 Unspecified abnormalities of breathing: Secondary | ICD-10-CM | POA: Diagnosis not present

## 2018-12-15 DIAGNOSIS — I495 Sick sinus syndrome: Secondary | ICD-10-CM | POA: Diagnosis not present

## 2018-12-15 DIAGNOSIS — Z950 Presence of cardiac pacemaker: Secondary | ICD-10-CM | POA: Diagnosis not present

## 2018-12-15 DIAGNOSIS — Z45018 Encounter for adjustment and management of other part of cardiac pacemaker: Secondary | ICD-10-CM | POA: Diagnosis not present

## 2018-12-17 DIAGNOSIS — R69 Illness, unspecified: Secondary | ICD-10-CM | POA: Diagnosis not present

## 2018-12-20 ENCOUNTER — Telehealth: Payer: Self-pay

## 2018-12-20 DIAGNOSIS — G4733 Obstructive sleep apnea (adult) (pediatric): Secondary | ICD-10-CM | POA: Diagnosis not present

## 2018-12-20 NOTE — Telephone Encounter (Signed)
Operator "All calls are being screened by [employer]" and this number is not accepting our call.

## 2018-12-20 NOTE — Telephone Encounter (Signed)
-----   Message from Adrian Prows, MD sent at 12/19/2018  5:15 PM EST ----- Regarding: Pacemaker Remote pacemaker check  12/14/2018: No mode switches. No VHR episodes. Health trends do not demonstrate significant abnormality. Battery longevity is 8.5-11.5 years. RA pacing is 99.5 %, RV pacing is 12.5 %.  Normal function, no further a. Fib JG

## 2018-12-21 ENCOUNTER — Other Ambulatory Visit: Payer: Self-pay

## 2018-12-21 ENCOUNTER — Other Ambulatory Visit: Payer: Self-pay | Admitting: Cardiology

## 2018-12-21 ENCOUNTER — Ambulatory Visit: Payer: Medicare HMO | Admitting: Cardiovascular Disease

## 2018-12-21 ENCOUNTER — Encounter: Payer: Self-pay | Admitting: Cardiovascular Disease

## 2018-12-21 VITALS — BP 148/68 | HR 60 | Temp 97.2°F | Ht 62.5 in | Wt 200.8 lb

## 2018-12-21 DIAGNOSIS — I480 Paroxysmal atrial fibrillation: Secondary | ICD-10-CM

## 2018-12-21 DIAGNOSIS — G4731 Primary central sleep apnea: Secondary | ICD-10-CM | POA: Diagnosis not present

## 2018-12-21 DIAGNOSIS — I10 Essential (primary) hypertension: Secondary | ICD-10-CM | POA: Diagnosis not present

## 2018-12-21 DIAGNOSIS — Z950 Presence of cardiac pacemaker: Secondary | ICD-10-CM

## 2018-12-21 NOTE — Patient Instructions (Signed)
Medication Instructions:  Your physician recommends that you continue on your current medications as directed. Please refer to the Current Medication list given to you today.  *If you need a refill on your cardiac medications before your next appointment, please call your pharmacy*  Follow-Up: At [facility], you and your health needs are our priority.  As part of our continuing mission to provide you with exceptional heart care, we have created designated Provider Care Teams.  These Care Teams include your primary Cardiologist (physician) and Advanced Practice Providers (APPs -  Physician Assistants and Nurse Practitioners) who all work together to provide you with the care you need, when you need it.  Your next appointment:   12 month(s) - sleep clinic  The format for your next appointment:   In Person  Provider:   Shelva Majestic, MD  Other Instructions

## 2018-12-22 ENCOUNTER — Other Ambulatory Visit: Payer: Self-pay | Admitting: Cardiology

## 2018-12-22 NOTE — Telephone Encounter (Signed)
We do not refill this medication correct? Please advise.

## 2018-12-23 ENCOUNTER — Telehealth: Payer: Self-pay

## 2018-12-23 NOTE — Telephone Encounter (Signed)
Pt called to inform us that she stopped her Simvastatin for the past 2 weeks, and feels better

## 2018-12-23 NOTE — Progress Notes (Signed)
Patient ID: TIASHA HELVIE, female   DOB: Jan 01, 1946, 73 y.o.   MRN: [id number]    Primary MD: Dr. Deland Pretty  Primary cardiologist: Dr. Einar Gip  HPI: [name] is a 73 y.o. female who was referred by Dr.Pharr for a sleep evaluation.  I initially saw her in July 2019 with follow-up in November 2019.  She presents for 1 year evaluation.    Ms.Ceniceros was diagnosed with sleep apnea in February 2013 when she was referred for sleep study for evaluation of nonrestorative sleep and loud snoring.  At that time she had witnessed apnea, was gasping for breath and her symptoms had persisted for over 2 years.  She was found to have moderate obstructive sleep apnea.  Overall, with an HIV 18.5 per hour.  However, with rim sleep.  Events were severe, with an HI 55.7 per hour.  She had significant oxygen desaturation to 70% with non-REM sleep and 64% with rim sleep.  She had moderate snoring.  She underwent a CPAP titration and with CPAP therapy due to development of central events, and the need for higher pressures.  She ultimately required BiPAP therapy.  Previously she had been followed by [facility] and had switched to [facility] in January 2015 for her MDE company.  She has not been able to get any supplies.  She has noticed a significant "whistle "her mask  I saw her for sleep evaluation in April 2016 and at that time was able to obtain a download of her unit from 03/28/2014 through 04/26/2014.  She was meeting Medicare compliance with use of 100%.  She is averaging 7 hours and 22 minutes per day and is currently set at an IPAP pressure of 19 and an EPAP pressure of 15.  Her AHI is very good at 4.0.  She set at a ramp time of 30 minutes.  She has a nasal mask, F&P Zest nasal mask.  She typically goes to bed at a 11:30 p.m. and wakes up at 6 AM for a sleep duration of only 6-1/2 hours.  She continues to have daytime sleepiness and an Epworth Sleepiness Scale score was elevated and endorsed at 15.  When I saw her in  July 2019 after not having seen her since 2016  her BiPAP machine quit and was non functional for over 2 years.  As result, she has not been sleeping well.  She went to bed at midnight and woke up between 7 and 7:30 in the morning.  She usually has 2-3 episodes of nocturia.  She does snore.  She had recently seen Dr. Deland Pretty who recommended reassessment of her sleep apnea she presents for evaluation.  She underwent a reevaluation on August 11, 2017.  AHI was 26.6, AHI during REM sleep was significantly elevated at 62.4/h and there was significant oxygen desaturation to a nadir of 73%.  Ultimately required BiPAP titration and underwent a BiPAP titration study on December 18, 2017 and 14/10 centimeters water pressure was recommended.  Since initiating BiPAP therapy, she has noticed significant improvement and feels much better.  She is sleeping well.  She has more energy.  Her set up date was October 19, 2017.  [employer] is her DME company.  A download was obtained from October 25, 2017 through November 23, 2017 which showed 100% compliance; sleeping 8 hours and 43 minutes.  AHI was excellent at 0.5 on her current settings.  Since I last saw her, Ms. Kareem has continued to use BiPAP.  She  has a dream station auto BiPAP unit with device settings at an EPAP of 10 and IPAP of 14.  Her ramp time has been set at 20 with a ramp start pressure of 4.  A new download was obtained in the office today from November 21, 2018 through December 20, 2018.  Compliance is excellent with 100% use; average use was 6 hours and 3 minutes per night.  A new Epworth Sleepiness Scale score was calculated in the office today and this endorsed at 16 consistent with residual daytime sleepiness.  She presents for evaluation.   Past Medical History:  Diagnosis Date  . Arthritis   . Diabetes mellitus without complication ([place])   . Encounter for care of pacemaker 09/03/2018  . History of chickenpox   . Hx of psoriatic arthritis   .  Hyperlipemia   . Hypertension   . Hypertension 05/03/2018  . Mitral valve disorders(424.0)   . Obstructive sleep apnea   . Pacemaker   . Paroxysmal atrial fibrillation ([place]) 10/11/2016  . Peripheral neuropathy   . Renal disorder Kidney disease stage2  . Thyroid disease hypothyroid  . Urine incontinence   . Vitamin D deficiency     Past Surgical History:  Procedure Laterality Date  . CARDIAC CATHETERIZATION N/A 04/29/2015   Procedure: Temporary Pacemaker;  Surgeon: Adrian Prows, MD;  Location: [facility];  Service: Cardiovascular;  Laterality: N/A;  . CARDIOVERSION N/A 09/07/2018   Procedure: CARDIOVERSION;  Surgeon: Adrian Prows, MD;  Location: [facility];  Service: Cardiovascular;  Laterality: N/A;  . CARDIOVERSION    . EP IMPLANTABLE DEVICE N/A 04/30/2015   Procedure: Pacemaker Implant;  Surgeon: Evans Lance, MD;  Location: [facility];  Service: Cardiovascular;  Laterality: N/A;  . INTRAVASCULAR PRESSURE WIRE/FFR STUDY N/A 08/31/2017   Procedure: INTRAVASCULAR PRESSURE WIRE/FFR STUDY;  Surgeon: Nigel Mormon, MD;  Location: [facility];  Service: Cardiovascular;  Laterality: N/A;  . LEFT AND RIGHT HEART CATHETERIZATION WITH CORONARY ANGIOGRAM N/A 09/30/2011   Procedure: LEFT AND RIGHT HEART CATHETERIZATION WITH CORONARY ANGIOGRAM;  Surgeon: Laverda Page, MD;  Location: [facility];  Service: Cardiovascular;  Laterality: N/A;  . RIGHT/LEFT HEART CATH AND CORONARY ANGIOGRAPHY N/A 08/31/2017   Procedure: RIGHT/LEFT HEART CATH AND CORONARY ANGIOGRAPHY;  Surgeon: Nigel Mormon, MD;  Location: [facility];  Service: Cardiovascular;  Laterality: N/A;  . TOOTH EXTRACTION  10/07/2018  . YAG LASER APPLICATION Right 1/61/0960   Procedure: YAG LASER APPLICATION;  Surgeon: Elta Guadeloupe T. Gershon Crane, MD;  Location: [facility];  Service: Ophthalmology;  Laterality: Right;  pt knows to arrive at 11:15  . YAG LASER APPLICATION Left 04/17/4096   Procedure: YAG LASER  APPLICATION;  Surgeon: Rutherford Guys, MD;  Location: [facility];  Service: Ophthalmology;  Laterality: Left;    Allergies  Allergen Reactions  . Hydrocodone Other (See Comments)    Lethargic   . Invokana [Canagliflozin] Palpitations and Other (See Comments)    Made heart race  . Oxycodone Other (See Comments)    Lethargic     Current Outpatient Medications  Medication Sig Dispense Refill  . amiodarone (PACERONE) 200 MG tablet Take 1 tablet (200 mg total) by mouth daily. 30 tablet 1  . apixaban (ELIQUIS) 5 MG TABS tablet Take 1 tablet (5 mg total) by mouth 2 (two) times daily. 60 tablet 3  . Cholecalciferol (VITAMIN D-3) 1000 units CAPS Take 2,000 Units by mouth daily.    . fenofibrate 160 MG tablet  Take 160 mg by mouth daily with supper.     . insulin aspart protamine- aspart (NOVOLOG MIX 70/30) (70-30) 100 UNIT/ML injection Inject 10-70 Units into the skin See admin instructions. Inject 65 units into the skin with breakfast, 40 units with supper (may adjust based on blood sugar readings)    . isosorbide mononitrate (IMDUR) 30 MG 24 hr tablet Take 1 tablet by mouth once daily 90 tablet 0  . levothyroxine (SYNTHROID, LEVOTHROID) 50 MCG tablet Take 50 mcg by mouth daily before breakfast.    . metoprolol tartrate (LOPRESSOR) 50 MG tablet Take 1 tablet by mouth twice daily 180 tablet 0  . nitroGLYCERIN (NITROSTAT) 0.4 MG SL tablet Place 1 tablet (0.4 mg total) under the tongue every 5 (five) minutes as needed for chest pain. 25 tablet 1  . nystatin cream (MYCOSTATIN) Apply 1 application topically 2 (two) times daily as needed (for irritation of affected area). 30 g 1  . olmesartan-hydrochlorothiazide (BENICAR HCT) 40-25 MG tablet Take 1 tablet by mouth daily. 90 tablet 3  . Omega-3 Fatty Acids (FISH OIL PO) Take 2,400 mg by mouth daily.    Marland Kitchen oxybutynin (DITROPAN-XL) 5 MG 24 hr tablet TAKE 1 TABLET BY MOUTH AT BEDTIME 30 tablet 3  . spironolactone (ALDACTONE) 25 MG tablet Take 1 tablet (25 mg  total) by mouth daily. 30 tablet 2  . torsemide (DEMADEX) 20 MG tablet Take 1 tablet (20 mg total) by mouth as needed. As needed for leg edema (Patient taking differently: Take 20 mg by mouth daily as needed (leg/feet/ankle edema). ) 30 tablet 3  . triamcinolone ointment (KENALOG) 0.5 % Apply 1 application topically 2 (two) times daily as needed (for itching). 30 g 1  . vitamin B-12 (CYANOCOBALAMIN) 1000 MCG tablet Take 2,000 mcg by mouth daily.    Marland Kitchen SHINGRIX injection      No current facility-administered medications for this visit.    Social History   Socioeconomic History  . Marital status: Married    Spouse name: Not on file  . Number of children: 3  . Years of education: Not on file  . Highest education level: Not on file  Occupational History  . Not on file  Tobacco Use  . Smoking status: Never Smoker  . Smokeless tobacco: Never Used  Substance and Sexual Activity  . Alcohol use: No    Alcohol/week: 0.0 standard drinks  . Drug use: No  . Sexual activity: Not Currently  Other Topics Concern  . Not on file  Social History Narrative  . Not on file   Social Determinants of Health   Financial Resource Strain:   . Difficulty of Paying Living Expenses: Not on file  Food Insecurity:   . Worried About Charity fundraiser in the Last Year: Not on file  . Ran Out of Food in the Last Year: Not on file  Transportation Needs:   . Lack of Transportation (Medical): Not on file  . Lack of Transportation (Non-Medical): Not on file  Physical Activity:   . Days of Exercise per Week: Not on file  . Minutes of Exercise per Session: Not on file  Stress:   . Feeling of Stress : Not on file  Social Connections:   . Frequency of Communication with Friends and Family: Not on file  . Frequency of Social Gatherings with Friends and Family: Not on file  . Attends Religious Services: Not on file  . Active Member of Clubs or Organizations: Not on file  .  Attends Archivist  Meetings: Not on file  . Marital Status: Not on file  Intimate Partner Violence:   . Fear of Current or Ex-Partner: Not on file  . Emotionally Abused: Not on file  . Physically Abused: Not on file  . Sexually Abused: Not on file   Social history is notable: She is married, has 3 children 5 grandchildren.  Family History  Problem Relation Age of Onset  . Arthritis Mother   . Diabetes Mother   . Heart disease Mother   . Hyperlipidemia Mother   . Hypertension Mother   . Arthritis Father   . Asthma Father   . Heart attack Father   . Hyperlipidemia Father   . Hypertension Father   . Stroke Father   . Arthritis Sister   . Diabetes Sister   . Hypertension Sister   . Arthritis Brother   . Heart attack Brother   . Heart disease Brother   . Hyperlipidemia Brother   . Hypertension Brother   . Alcohol abuse Brother   . Arthritis Brother   . Diabetes Brother   . Early death Brother   . Heart disease Brother   . Hyperlipidemia Brother   . Hypertension Brother   . Arthritis Sister   . Cancer Sister   . Diabetes Sister   . Hyperlipidemia Sister   . Hypertension Sister   . Stroke Sister   . Cancer Sister   . Hyperlipidemia Sister   . Hypertension Sister   . Hypertension Daughter    Family history is notable that her mother died at age 104 and had diabetes and heart disease.  Father died of stroke at age 23.  There are total of 6 siblings, 3 brothers and 3 sisters of which 2 brothers and 2 sisters are deceased.  One sister who is deceased, had sleep apnea but was untreated.  One brother at heart disease is deceased and had sleep apnea and was noncompliant with his CPAP use.  ROS General: Negative; No fevers, chills, or night sweats.  Positive for moderate obesity HEENT: Negative; No changes in vision or hearing, sinus congestion, difficulty swallowing Pulmonary: Negative; No cough, wheezing, shortness of breath, hemoptysis Cardiovascular: Positive for diastolic heart failure GI:  Negative; No nausea, vomiting, diarrhea, or abdominal pain GU: Negative; No dysuria, hematuria, or difficulty voiding Musculoskeletal: Negative; no myalgias, joint pain, or weakness Hematologic: Negative; no easy bruising, bleeding Endocrine: Positive for diabetes mellitus Neuro: Negative; no changes in balance, headaches Skin: Negative; No rashes or skin lesions Psychiatric: Negative; No behavioral problems, depression Sleep: Positive for OSA previously with  central events with CPAP therapy; now on BiPAP.   Physical Exam BP (!) 148/68   Pulse 60   Temp (!) 97.2 F (36.2 C)   Ht 5' 2.5" (1.588 m)   Wt 200 lb 12.8 oz (91.1 kg)   BMI 36.14 kg/m    Repeat blood pressure by me was 122/70  Wt Readings from Last 3 Encounters:  12/21/18 200 lb 12.8 oz (91.1 kg)  11/19/18 202 lb 12.8 oz (92 kg)  10/08/18 198 lb 14.4 oz (90.2 kg)   General: Alert, oriented, no distress.  Skin: normal turgor, no rashes, warm and dry HEENT: Normocephalic, atraumatic. Pupils equal round and reactive to light; sclera anicteric; extraocular muscles intact;  Nose without nasal septal hypertrophy Mouth/Parynx benign; Mallinpatti scale 3 Neck: No JVD, no carotid bruits; normal carotid upstroke Lungs: clear to ausculatation and percussion; no wheezing or rales Chest wall: without  tenderness to palpitation Heart: PMI not displaced, RRR, s1 s2 normal, 1/6 systolic murmur, no diastolic murmur, no rubs, gallops, thrills, or heaves Abdomen: soft, nontender; no hepatosplenomehaly, BS+; abdominal aorta nontender and not dilated by palpation. Back: no CVA tenderness Pulses 2+ Musculoskeletal: full range of motion, normal strength, no joint deformities Extremities: no clubbing cyanosis or edema, Homan's sign negative  Neurologic: grossly nonfocal; Cranial nerves grossly wnl Psychologic: Normal mood and affect  ECG (independently read by me): Atrially paced at 78 bpm with prolonged AV conduction with a PR interval  of 296 ms.  R wave progression anteriorly.  ECG from Jun 07, 2017 was independently reviewed by me which reveals atrially paced rhythm.  Poor anterior R wave progression.  LABS:  BMP Latest Ref Rng & Units 09/07/2018 08/31/2018 06/30/2018  Glucose 70 - 99 mg/dL 149(H) 146(H) 119(H)  BUN 8 - 23 mg/dL 30(H) 24 19  Creatinine 0.44 - 1.00 mg/dL 1.50(H) 1.39(H) 1.29(H)  BUN/Creat Ratio 12 - 28 - 17 -  Sodium 135 - 145 mmol/L 139 139 140  Potassium 3.5 - 5.1 mmol/L 4.9 5.2 4.2  Chloride 98 - 111 mmol/L 99 100 104  CO2 20 - 29 mmol/L - 25 26  Calcium 8.7 - 10.3 mg/dL - 10.5(H) 10.1     Hepatic Function Latest Ref Rng & Units 06/30/2018 08/28/2017 06/08/2017  Total Protein 6.5 - 8.1 g/dL 7.6 6.8 7.0  Albumin 3.5 - 5.0 g/dL 3.5 3.3(L) 3.4(L)  AST 15 - 41 U/L _0 ALT 0 - 44 U/L _1 Alk Phosphatase 38 - 126 U/L 29(L) 31(L) 24(L)  Total Bilirubin 0.3 - 1.2 mg/dL 0.3 0.9 0.7     CBC Latest Ref Rng & Units 09/07/2018 06/30/2018 10/24/2017  WBC 4.0 - 10.5 K/uL - 6.3 8.2  Hemoglobin 12.0 - 15.0 g/dL 15.3(H) 13.7 12.1  Hematocrit 36.0 - 46.0 % 45.0 43.6 38.6  Platelets 150 - 400 K/uL - 152 128(L)     Lipid Panel     Component Value Date/Time   CHOL 168 08/29/2017 0328   TRIG 544 (H) 08/29/2017 0328   HDL 19 (L) 08/29/2017 0328   CHOLHDL 8.8 08/29/2017 0328   VLDL UNABLE TO CALCULATE IF TRIGLYCERIDE OVER 400 mg/dL 08/29/2017 0328   LDLCALC UNABLE TO CALCULATE IF TRIGLYCERIDE OVER 400 mg/dL 08/29/2017 0328     RADIOLOGY: No results found.  IMPRESSION:  1. Complex sleep apnea syndrome: on BiPAP   2. Paroxysmal atrial fibrillation (HCC)   3. Cardiac pacemaker in situ on 04/30/2015 [employer] Adapta Dr Peggyann Juba; Leads only MRI compatible: Sinus node dysfunction   4. Essential hypertension    ASSESSMENT AND PLAN: Ms. Emmylou Bieker is a 73 year-old female who has documented to have complex sleep apnea with both obstructive and central events and has been on BiPAP therapy since  2013.  She has significant cardiovascular comorbidities and has been followed by Dr. Einar Gip for her cardiac issues.  I had not seen her since April 2016 and when I saw her in July 2019 her BiPAP machine had been nonfunctional over several years.  On her follow-up sleep evaluation she was found to have moderate overall sleep apnea which was very severe during REM sleep with significant oxygen desaturation to 73%.  I was able to obtain a download from her DreamStation Auto BiPAP.  Although she is 100% compliant with use she is not sleeping adequate duration which may be contributing to her residual excessive daytime sleepiness.  AHI  is excellent at 1.2.  However, I have recommended that we reduce her ramp time from 20 minutes to 15 minutes and I am increasing her ramp start up pressure to 6 instead of 4.  We discussed optimal sleep duration at 8 hours.  Her blood pressure on repeat by me was controlled at 122/70.  She has continued to be on amiodarone 200 mg daily and has atrially paced rhythm.  She is on anticoagulation with Eliquis.  She continues to be on spironolactone 25 mg daily, olmesartan HCT 40/25, metoprolol 50 mg twice daily in addition to her isosorbide mononitrate.  She is on fenofibrate for hyperlipidemia.  She will follow-up with Dr. Einar Gip for her cardiology care.  I will see her in 1 year for sleep evaluation    Time spent: 25 minutes Troy Sine, MD, [facility]  12/23/2018 7:15 PM

## 2018-12-27 NOTE — Telephone Encounter (Signed)
Called pt to inform her about the message above, pt mention she has not taken the medication in the last 2 weeks and has felt better.

## 2018-12-27 NOTE — Telephone Encounter (Signed)
I would recommend trying low dose Crestor, like 10 mg daily

## 2018-12-27 NOTE — Telephone Encounter (Signed)
Glad to hear that, has she ever taken Crestor or Lipitor?

## 2018-12-27 NOTE — Telephone Encounter (Signed)
done

## 2018-12-27 NOTE — Telephone Encounter (Signed)
Called pt no answer, could not left a vm

## 2018-12-27 NOTE — Telephone Encounter (Signed)
Called pt and asked her if she has ever taken Lipitor or crestor, pt mention she has not

## 2018-12-31 DIAGNOSIS — E1122 Type 2 diabetes mellitus with diabetic chronic kidney disease: Secondary | ICD-10-CM | POA: Diagnosis not present

## 2018-12-31 DIAGNOSIS — E039 Hypothyroidism, unspecified: Secondary | ICD-10-CM | POA: Diagnosis not present

## 2018-12-31 DIAGNOSIS — G609 Hereditary and idiopathic neuropathy, unspecified: Secondary | ICD-10-CM | POA: Diagnosis not present

## 2018-12-31 DIAGNOSIS — E559 Vitamin D deficiency, unspecified: Secondary | ICD-10-CM | POA: Diagnosis not present

## 2018-12-31 DIAGNOSIS — E785 Hyperlipidemia, unspecified: Secondary | ICD-10-CM | POA: Diagnosis not present

## 2018-12-31 DIAGNOSIS — I129 Hypertensive chronic kidney disease with stage 1 through stage 4 chronic kidney disease, or unspecified chronic kidney disease: Secondary | ICD-10-CM | POA: Diagnosis not present

## 2018-12-31 DIAGNOSIS — N183 Chronic kidney disease, stage 3 unspecified: Secondary | ICD-10-CM | POA: Diagnosis not present

## 2019-01-02 DIAGNOSIS — R069 Unspecified abnormalities of breathing: Secondary | ICD-10-CM | POA: Diagnosis not present

## 2019-01-02 DIAGNOSIS — I5033 Acute on chronic diastolic (congestive) heart failure: Secondary | ICD-10-CM | POA: Diagnosis not present

## 2019-01-04 DIAGNOSIS — G4733 Obstructive sleep apnea (adult) (pediatric): Secondary | ICD-10-CM | POA: Diagnosis not present

## 2019-01-10 NOTE — Progress Notes (Signed)
Primary Physician:  Deland Pretty, MD   Patient ID: [name], female    DOB: 1945/09/28, 73 y.o.   MRN: [id number]  Subjective:    Chief Complaint  Patient presents with  . Atrial Fibrillation  . Shortness of Breath  . Follow-up    6wk    HPI: [name]  is a 73 y.o. female  with hypertension, type 2 diabetes mellitus, nonobstructive CAD (Cath 2019), sick sinus syndrome status post dual-chamber pacemaker 2017, hyperlipidemia, stage III chronic kidney disease, OSA, paroxysmal Afib, paroxysmal atrial tachycardia.  Patient was recently seen and noted to be in Atrial flutter, she was started on amiodarone and underwent successful cardioversion on 09/07/2018.  I have not seen the patient frequently due to fatigue and dyspnea on exertion, felt to be related to deconditioning and obesity with hypoventilation.  She did complain of myalgias symptoms at her last office visit that improved with stopping simvastatin.  I had recommended that she start Crestor, does not feel that she has yet started this.  She states that she saw her endocrinologist last week, and due to some confusion has been not taking her diabetes medication or her thyroid medication for the last few months.  She is now back on her thyroid medication and feels that this is helping her symptoms.  She is hoping to start losing weight soon as her appetite has decreased since being back on her diabetes medication.  She is also having some increased energy.  States that she was able to mop her floors for the first time in several months the other day and tolerated this well.  She is under a lot of stress in caring for her husband that is disabled and also now her daughter that recently had CVA.   Past Medical History:  Diagnosis Date  . Arthritis   . Diabetes mellitus without complication ([place])   . Encounter for care of pacemaker 09/03/2018  . History of chickenpox   . Hx of psoriatic arthritis   . Hyperlipemia   .  Hypertension   . Hypertension 05/03/2018  . Mitral valve disorders(424.0)   . Obstructive sleep apnea   . Pacemaker   . Paroxysmal atrial fibrillation ([place]) 10/11/2016  . Peripheral neuropathy   . Renal disorder Kidney disease stage2  . Thyroid disease hypothyroid  . Urine incontinence   . Vitamin D deficiency     Past Surgical History:  Procedure Laterality Date  . CARDIAC CATHETERIZATION N/A 04/29/2015   Procedure: Temporary Pacemaker;  Surgeon: Adrian Prows, MD;  Location: [place] [facility];  Service: Cardiovascular;  Laterality: N/A;  . CARDIOVERSION N/A 09/07/2018   Procedure: CARDIOVERSION;  Surgeon: Adrian Prows, MD;  Location: [place];  Service: Cardiovascular;  Laterality: N/A;  . CARDIOVERSION    . EP IMPLANTABLE DEVICE N/A 04/30/2015   Procedure: Pacemaker Implant;  Surgeon: Evans Lance, MD;  Location: [place] [facility];  Service: Cardiovascular;  Laterality: N/A;  . INTRAVASCULAR PRESSURE WIRE/FFR STUDY N/A 08/31/2017   Procedure: INTRAVASCULAR PRESSURE WIRE/FFR STUDY;  Surgeon: Nigel Mormon, MD;  Location: [place] [facility];  Service: Cardiovascular;  Laterality: N/A;  . LEFT AND RIGHT HEART CATHETERIZATION WITH CORONARY ANGIOGRAM N/A 09/30/2011   Procedure: LEFT AND RIGHT HEART CATHETERIZATION WITH CORONARY ANGIOGRAM;  Surgeon: Laverda Page, MD;  Location: [facility];  Service: Cardiovascular;  Laterality: N/A;  . RIGHT/LEFT HEART CATH AND CORONARY ANGIOGRAPHY N/A 08/31/2017   Procedure: RIGHT/LEFT HEART CATH AND CORONARY ANGIOGRAPHY;  Surgeon: Nigel Mormon, MD;  Location: [place] [facility];  Service: Cardiovascular;  Laterality: N/A;  . TOOTH EXTRACTION  10/07/2018  . YAG LASER APPLICATION Right [id number]   Procedure: YAG LASER APPLICATION;  Surgeon: Elta Guadeloupe T. Gershon Crane, MD;  Location: [facility];  Service: Ophthalmology;  Laterality: Right;  pt knows to arrive at 11:15  . YAG LASER APPLICATION Left [id number]   Procedure: YAG LASER APPLICATION;  Surgeon:  Rutherford Guys, MD;  Location: [facility];  Service: Ophthalmology;  Laterality: Left;    Social History   Socioeconomic History  . Marital status: Married    Spouse name: Not on file  . Number of children: 3  . Years of education: Not on file  . Highest education level: Not on file  Occupational History  . Not on file  Tobacco Use  . Smoking status: Never Smoker  . Smokeless tobacco: Never Used  Substance and Sexual Activity  . Alcohol use: No    Alcohol/week: 0.0 standard drinks  . Drug use: No  . Sexual activity: Not Currently  Other Topics Concern  . Not on file  Social History Narrative  . Not on file   Social Determinants of Health   Financial Resource Strain:   . Difficulty of Paying Living Expenses: Not on file  Food Insecurity:   . Worried About Charity fundraiser in the Last Year: Not on file  . Ran Out of Food in the Last Year: Not on file  Transportation Needs:   . Lack of Transportation (Medical): Not on file  . Lack of Transportation (Non-Medical): Not on file  Physical Activity:   . Days of Exercise per Week: Not on file  . Minutes of Exercise per Session: Not on file  Stress:   . Feeling of Stress : Not on file  Social Connections:   . Frequency of Communication with Friends and Family: Not on file  . Frequency of Social Gatherings with Friends and Family: Not on file  . Attends Religious Services: Not on file  . Active Member of Clubs or Organizations: Not on file  . Attends Archivist Meetings: Not on file  . Marital Status: Not on file  Intimate Partner Violence:   . Fear of Current or Ex-Partner: Not on file  . Emotionally Abused: Not on file  . Physically Abused: Not on file  . Sexually Abused: Not on file    Review of Systems  Constitution: Positive for malaise/fatigue. Negative for chills, decreased appetite and weight gain.  Cardiovascular: Positive for dyspnea on exertion (with minimal activity). Negative for chest pain,  claudication, leg swelling, orthopnea, palpitations and syncope.  Respiratory: Negative for cough, hemoptysis and wheezing.   Endocrine: Negative for cold intolerance.  Hematologic/Lymphatic: Does not bruise/bleed easily.  Musculoskeletal: Negative for arthritis and joint swelling.  Gastrointestinal: Negative for abdominal pain, anorexia and change in bowel habit.  Neurological: Negative for headaches and light-headedness.  Psychiatric/Behavioral: Negative for depression and substance abuse.  All other systems reviewed and are negative.     Objective:  Blood pressure (!) 139/45, pulse 60, temperature 97.7 F (36.5 C), height 5\' 2"  (1.575 m), weight 202 lb 8 oz (91.9 kg), SpO2 95 %. Body mass index is 37.04 kg/m.  Physical Exam  Constitutional: She appears well-developed and well-nourished. No distress.  HENT:  Head: Atraumatic.  Eyes: Conjunctivae are normal.  Neck: No JVD present. No thyromegaly present.  Cardiovascular: Normal rate, regular rhythm, S1 normal, S2 normal and intact distal  pulses. Exam reveals no gallop.  Murmur heard. High-pitched blowing holosystolic murmur is present with a grade of 2/6 at the apex. Pulses:      Carotid pulses are 2+ on the right side and 2+ on the left side with bruit.      Popliteal pulses are 1+ on the right side and 1+ on the left side.       Dorsalis pedis pulses are 1+ on the right side and 1+ on the left side.       Posterior tibial pulses are 1+ on the right side and 1+ on the left side.  II/VI SEM in the right parasternal border  Pulmonary/Chest: Effort normal and breath sounds normal.  Left infraclavicular pacemaker pocket noted  Abdominal: Soft. Bowel sounds are normal.  Musculoskeletal:        General: No edema. Normal range of motion.     Cervical back: Neck supple.  Neurological: She is alert.  Skin: Skin is warm and dry.  Psychiatric: She has a normal mood and affect.   Radiology: No results found.  Laboratory examination:     CMP Latest Ref Rng & Units 09/07/2018 08/31/2018 06/30/2018  Glucose 70 - 99 mg/dL 149(H) 146(H) 119(H)  BUN 8 - 23 mg/dL 30(H) 24 19  Creatinine 0.44 - 1.00 mg/dL 1.50(H) 1.39(H) 1.29(H)  Sodium 135 - 145 mmol/L 139 139 140  Potassium 3.5 - 5.1 mmol/L 4.9 5.2 4.2  Chloride 98 - 111 mmol/L 99 100 104  CO2 20 - 29 mmol/L - 25 26  Calcium 8.7 - 10.3 mg/dL - 10.5(H) 10.1  Total Protein 6.5 - 8.1 g/dL - - 7.6  Total Bilirubin 0.3 - 1.2 mg/dL - - 0.3  Alkaline Phos 38 - 126 U/L - - 29(L)  AST 15 - 41 U/L - - 25  ALT 0 - 44 U/L - - 23   CBC Latest Ref Rng & Units 09/07/2018 06/30/2018 10/24/2017  WBC 4.0 - 10.5 K/uL - 6.3 8.2  Hemoglobin 12.0 - 15.0 g/dL 15.3(H) 13.7 12.1  Hematocrit 36.0 - 46.0 % 45.0 43.6 38.6  Platelets 150 - 400 K/uL - 152 128(L)   Lipid Panel     Component Value Date/Time   CHOL 168 08/29/2017 0328   TRIG 544 (H) 08/29/2017 0328   HDL 19 (L) 08/29/2017 0328   CHOLHDL 8.8 08/29/2017 0328   VLDL UNABLE TO CALCULATE IF TRIGLYCERIDE OVER 400 mg/dL 08/29/2017 0328   LDLCALC UNABLE TO CALCULATE IF TRIGLYCERIDE OVER 400 mg/dL 08/29/2017 0328   HEMOGLOBIN A1C Lab Results  Component Value Date   HGBA1C 7.3 (H) 09/30/2015   MPG 163 09/30/2015   TSH No results for input(s): TSH in the last 8760 hours.  PRN Meds:. Medications Discontinued During This Encounter  Medication Reason  . Omega-3 Fatty Acids (FISH OIL PO) Patient Preference   Current Meds  Medication Sig  . amiodarone (PACERONE) 200 MG tablet Take 1 tablet (200 mg total) by mouth daily.  Marland Kitchen apixaban (ELIQUIS) 5 MG TABS tablet Take 1 tablet (5 mg total) by mouth 2 (two) times daily.  . Cholecalciferol (VITAMIN D-3) 1000 units CAPS Take 2,000 Units by mouth daily.  . fenofibrate 160 MG tablet Take 160 mg by mouth daily with supper.   . insulin aspart protamine- aspart (NOVOLOG MIX 70/30) (70-30) 100 UNIT/ML injection Inject 10-70 Units into the skin See admin instructions. Inject 50 units into the skin  with breakfast, 35 units with supper (may adjust based on blood sugar readings)  .  isosorbide mononitrate (IMDUR) 30 MG 24 hr tablet Take 1 tablet by mouth once daily  . levothyroxine (SYNTHROID, LEVOTHROID) 50 MCG tablet Take 50 mcg by mouth daily before breakfast.  . metoprolol tartrate (LOPRESSOR) 50 MG tablet Take 1 tablet by mouth twice daily  . nitroGLYCERIN (NITROSTAT) 0.4 MG SL tablet Place 1 tablet (0.4 mg total) under the tongue every 5 (five) minutes as needed for chest pain.  Marland Kitchen nystatin cream (MYCOSTATIN) Apply 1 application topically 2 (two) times daily as needed (for irritation of affected area).  . olmesartan-hydrochlorothiazide (BENICAR HCT) 40-25 MG tablet Take 1 tablet by mouth daily.  Marland Kitchen oxybutynin (DITROPAN-XL) 5 MG 24 hr tablet TAKE 1 TABLET BY MOUTH AT BEDTIME  . spironolactone (ALDACTONE) 25 MG tablet Take 1 tablet (25 mg total) by mouth daily.  Marland Kitchen torsemide (DEMADEX) 20 MG tablet Take 1 tablet (20 mg total) by mouth as needed. As needed for leg edema (Patient taking differently: Take 20 mg by mouth daily as needed (leg/feet/ankle edema). )  . triamcinolone ointment (KENALOG) 0.5 % Apply 1 application topically 2 (two) times daily as needed (for itching).  . vitamin B-12 (CYANOCOBALAMIN) 1000 MCG tablet Take 2,000 mcg by mouth daily.    Cardiac Studies:   Echocardiogram 11/03/2018: Left ventricle cavity is normal in size. Moderate concentric hypertrophy of the left ventricle. Normal LV systolic function with EF 56%. Normal global wall motion. Unable to evaluate diastolic function due to E/A fusion.  Left atrial cavity is severely dilated. Trace aortic regurgitation. Moderate (Grade III) mitral regurgitation. Mild mitral valve leaflet thickening. Moderate tricuspid regurgitation. Estimated pulmonary artery systolic pressure is 40 mmHg.  No significant change compared to previous study on 07/01/2017.  Direct current cardioversion 09/07/2018:  Indication symptomatic A.  Flutter. Procedure: Using 50 mg of IV Propofol and 20 IV Lidocaine (for reducing venous pain) for achieving deep sedation, synchronized direct current cardioversion performed. Patient was delivered with 120 Joules of electricity X 1 with success to A-Paced Rhythm. Patient tolerated the procedure well. No immediate complication noted.    Carotid Doppler [02/10/2014]: No evidence of hemodynamically significant stenosis in the bilateral carotid bifurcation vessels. There is mild evidence of homogeneous plaque in bilateral carotid arteries. No significant change from 10/13/2011.  Lexiscan myoview stress test 06/15/2017: 1. Lexiscan stress test was performed. Exercise capacity was not assessed. Stress symptoms included dizziness. Peak blood pressure 158/66 mmHg. Stress EKG is non diagnostic for ischemia as it is a pharmacologic stress. In addition, it demonstrated atrial pacing and incomplete LBBB. 2. The overall quality of the study is excellent. There is no evidence of abnormal lung activity. Stress and rest SPECT images demonstrate homogeneous tracer distribution throughout the myocardium. Gated SPECT imaging reveals normal myocardial thickening and wall motion. The left ventricular ejection fraction was normal (71%). 3. Low risk study.  Sleep Study [08/11/2017]: Positive for Complex Sleep Apnea; On CPAP   R&LHC 08/31/2017: LCx: Nondominant. AV grove LCx 50-60% diffuse disease. RCA: Large dominant. Ostial PDA 50% stenosis, FFR 0.96. No change from 09/30/2011.  RA: 5 mmHg RV: 45/4 mmHg. PA: 40/12 mmHg. Mean PA 29 mmHg PCWP: 16 mmHg LV: 130/3 mmHg, LVEDP 13 mmHg  CO: 3.6 L/min. CI 1.9 L/min/m2  Assessment:     ICD-10-CM   1. Dyspnea on exertion  R06.00   2. Paroxysmal atrial fibrillation (HCC)  I48.0 EKG 12-Lead  3. Cardiac pacemaker in situ on 04/30/2015 [employer] Adapta Dr Peggyann Juba; Leads only MRI compatible: Sinus node dysfunction  Z95.0   4. Coronary  artery disease involving native  coronary artery of native heart without angina pectoris  I25.10   5. Moderate obesity  E66.8     EKG 01/11/2019: Atrial paced rhythm with occasional sinus beats with first degree AV block at 60 bpm, left anterior fasicular block, Incomplete left bundle branch block, LVH. ST-T wave abnormality in lateral leads, cannot exclude ischemia.   EKG 08/30/2018: Atypical atrial flutter with 3:1 conduction, V rate 110 bpm, left axis deviation. LBBB.  Remote pacemaker check  12/14/2018: No mode switches. No VHR episodes. Health trends do not demonstrate significant abnormality. Battery longevity is 8.5-11.5 years. RA pacing is 99.5 %, RV pacing is 12.5 %.  Scheduled In office pacemaker check 08/30/2018:  There were 6 AHR Episodes Since 08/08/2018, EGM recordings reveal maximum a rate between 180-201 bpm,, The longest His persistent since 08/30/2018,Atrial rate 180, ventricular rate 1 28 bpm, EGM's suggest atrial tachycardia/AFL.  There was 0.4% truly due to atrial arrhythmia burden, pacemaker thresholds and impedance stable.  Longevity 8.5, 12 years.  Recommendations:   Over the last few weeks she has had some improvement in her dyspnea and fatigue, hopefully this will continue to improve.  Her mild symptoms improved with stopping simvastatin, but she was unaware of starting Crestor.  I have sent prescription for Crestor 10 mg daily.  She will need to be on statin therapy if tolerated in view of nonobstructive CAD. I do not have recent lipids, will request these from PCP office. She will notify me if she again has recurrence of myalgia symptoms.  I continue to feel that her dyspnea is likely multifactorial from obesity with hypoventilation.  We have discussed increasing her activity level and making diet changes to help with weight loss.  Blood pressure is well controlled.  I reviewed recent remote pacemaker transmission that is stable.  Pacemaker is functioning appropriately.  She will continue with amiodarone  200 mg daily as she has previously had recurrence of A. fib with lower dose.  Tolerating anticoagulation well with no bleeding diathesis.  We will plan to see her back in 4 months for close monitoring of her compliance with weight loss and also follow-up on her dyspnea.   Miquel Dunn, MSN, APRN, FNP-C [facility]. [place] Office: [phone] Fax: [phone]

## 2019-01-11 ENCOUNTER — Other Ambulatory Visit: Payer: Self-pay

## 2019-01-11 ENCOUNTER — Ambulatory Visit: Payer: Medicare HMO | Admitting: Cardiology

## 2019-01-11 ENCOUNTER — Encounter: Payer: Self-pay | Admitting: Cardiology

## 2019-01-11 VITALS — BP 139/45 | HR 60 | Temp 97.7°F | Ht 62.0 in | Wt 202.5 lb

## 2019-01-11 DIAGNOSIS — E668 Other obesity: Secondary | ICD-10-CM | POA: Diagnosis not present

## 2019-01-11 DIAGNOSIS — Z950 Presence of cardiac pacemaker: Secondary | ICD-10-CM

## 2019-01-11 DIAGNOSIS — I480 Paroxysmal atrial fibrillation: Secondary | ICD-10-CM | POA: Diagnosis not present

## 2019-01-11 DIAGNOSIS — R0609 Other forms of dyspnea: Secondary | ICD-10-CM

## 2019-01-11 DIAGNOSIS — I2510 Atherosclerotic heart disease of native coronary artery without angina pectoris: Secondary | ICD-10-CM | POA: Diagnosis not present

## 2019-01-11 DIAGNOSIS — R0600 Dyspnea, unspecified: Secondary | ICD-10-CM

## 2019-01-11 MED ORDER — ROSUVASTATIN CALCIUM 10 MG PO TABS: 10.0000 mg | ORAL_TABLET | Freq: Every day | ORAL | 3 refills | Status: DC

## 2019-01-16 ENCOUNTER — Other Ambulatory Visit: Payer: Self-pay | Admitting: Cardiology

## 2019-01-20 DIAGNOSIS — G4733 Obstructive sleep apnea (adult) (pediatric): Secondary | ICD-10-CM | POA: Diagnosis not present

## 2019-01-23 ENCOUNTER — Other Ambulatory Visit: Payer: Self-pay | Admitting: Cardiology

## 2019-01-27 DIAGNOSIS — I509 Heart failure, unspecified: Secondary | ICD-10-CM | POA: Diagnosis not present

## 2019-01-27 DIAGNOSIS — G4733 Obstructive sleep apnea (adult) (pediatric): Secondary | ICD-10-CM | POA: Diagnosis not present

## 2019-01-27 DIAGNOSIS — Z794 Long term (current) use of insulin: Secondary | ICD-10-CM | POA: Diagnosis not present

## 2019-01-27 DIAGNOSIS — I110 Hypertensive heart disease with heart failure: Secondary | ICD-10-CM | POA: Diagnosis not present

## 2019-01-27 DIAGNOSIS — D6869 Other thrombophilia: Secondary | ICD-10-CM | POA: Diagnosis not present

## 2019-01-27 DIAGNOSIS — I4891 Unspecified atrial fibrillation: Secondary | ICD-10-CM | POA: Diagnosis not present

## 2019-01-27 DIAGNOSIS — Z008 Encounter for other general examination: Secondary | ICD-10-CM | POA: Diagnosis not present

## 2019-01-27 DIAGNOSIS — E119 Type 2 diabetes mellitus without complications: Secondary | ICD-10-CM | POA: Diagnosis not present

## 2019-01-27 DIAGNOSIS — E039 Hypothyroidism, unspecified: Secondary | ICD-10-CM | POA: Diagnosis not present

## 2019-01-27 DIAGNOSIS — E785 Hyperlipidemia, unspecified: Secondary | ICD-10-CM | POA: Diagnosis not present

## 2019-02-02 DIAGNOSIS — I5033 Acute on chronic diastolic (congestive) heart failure: Secondary | ICD-10-CM | POA: Diagnosis not present

## 2019-02-02 DIAGNOSIS — R069 Unspecified abnormalities of breathing: Secondary | ICD-10-CM | POA: Diagnosis not present

## 2019-02-15 ENCOUNTER — Other Ambulatory Visit: Payer: Self-pay | Admitting: Cardiology

## 2019-02-15 ENCOUNTER — Other Ambulatory Visit: Payer: Self-pay | Admitting: Adult Health

## 2019-02-18 NOTE — Telephone Encounter (Signed)
Ok to fill 

## 2019-02-20 ENCOUNTER — Other Ambulatory Visit: Payer: Self-pay | Admitting: Cardiology

## 2019-02-20 DIAGNOSIS — G4733 Obstructive sleep apnea (adult) (pediatric): Secondary | ICD-10-CM | POA: Diagnosis not present

## 2019-02-24 ENCOUNTER — Other Ambulatory Visit: Payer: Self-pay

## 2019-02-25 ENCOUNTER — Other Ambulatory Visit: Payer: Self-pay

## 2019-02-25 MED ORDER — METOPROLOL TARTRATE 50 MG PO TABS: 50.0000 mg | ORAL_TABLET | Freq: Two times a day (BID) | ORAL | 0 refills | Status: DC

## 2019-03-05 DIAGNOSIS — R069 Unspecified abnormalities of breathing: Secondary | ICD-10-CM | POA: Diagnosis not present

## 2019-03-05 DIAGNOSIS — I5033 Acute on chronic diastolic (congestive) heart failure: Secondary | ICD-10-CM | POA: Diagnosis not present

## 2019-03-10 DIAGNOSIS — Z794 Long term (current) use of insulin: Secondary | ICD-10-CM | POA: Diagnosis not present

## 2019-03-10 DIAGNOSIS — E119 Type 2 diabetes mellitus without complications: Secondary | ICD-10-CM | POA: Diagnosis not present

## 2019-03-16 DIAGNOSIS — Z45018 Encounter for adjustment and management of other part of cardiac pacemaker: Secondary | ICD-10-CM | POA: Diagnosis not present

## 2019-03-16 DIAGNOSIS — Z950 Presence of cardiac pacemaker: Secondary | ICD-10-CM | POA: Diagnosis not present

## 2019-03-16 DIAGNOSIS — I495 Sick sinus syndrome: Secondary | ICD-10-CM | POA: Diagnosis not present

## 2019-03-20 DIAGNOSIS — G4733 Obstructive sleep apnea (adult) (pediatric): Secondary | ICD-10-CM | POA: Diagnosis not present

## 2019-04-02 DIAGNOSIS — R069 Unspecified abnormalities of breathing: Secondary | ICD-10-CM | POA: Diagnosis not present

## 2019-04-02 DIAGNOSIS — I5033 Acute on chronic diastolic (congestive) heart failure: Secondary | ICD-10-CM | POA: Diagnosis not present

## 2019-04-20 DIAGNOSIS — G4733 Obstructive sleep apnea (adult) (pediatric): Secondary | ICD-10-CM | POA: Diagnosis not present

## 2019-04-24 ENCOUNTER — Other Ambulatory Visit: Payer: Self-pay | Admitting: Cardiology

## 2019-04-29 DIAGNOSIS — G4733 Obstructive sleep apnea (adult) (pediatric): Secondary | ICD-10-CM | POA: Diagnosis not present

## 2019-05-03 DIAGNOSIS — R069 Unspecified abnormalities of breathing: Secondary | ICD-10-CM | POA: Diagnosis not present

## 2019-05-03 DIAGNOSIS — I5033 Acute on chronic diastolic (congestive) heart failure: Secondary | ICD-10-CM | POA: Diagnosis not present

## 2019-05-05 ENCOUNTER — Other Ambulatory Visit: Payer: Self-pay | Admitting: Adult Health

## 2019-05-09 NOTE — Progress Notes (Signed)
Primary Physician/Referring:  Deland Pretty, MD  Patient ID: [name], female    DOB: October 17, 1945, 74 y.o.   MRN: [id number]  Chief Complaint  Patient presents with  . PAF  . DOE    Per patient, has improved  . Follow-up    4 month   HPI:    [name]  is a 74 y.o. female  with hypertension, type 2 diabetes mellitus with stage 3 CKD, mild hyperlipidemia on Tricor, nonobstructive CAD (Cath 2019), sick sinus syndrome status post dual-chamber pacemaker 2017, OSA on BiPAP, paroxysmal Afib and atrial flutter/atrial tachycardia.  Her last cardioversion was 09/07/2018 for atrial flutter.  She now presents here for a 75-monthoffice visit.  She is under a lot of stress in caring for her husband that is disabled and ill.  She is also enrolled in our pacemaker clinic.  Presently doing well except feels fatigued and tired due to stress.  Past Medical History:  Diagnosis Date  . Arthritis   . Diabetes mellitus without complication ([place]   . Encounter for care of pacemaker 09/03/2018  . History of chickenpox   . Hx of psoriatic arthritis   . Hyperlipemia   . Hypertension   . Hypertension 05/03/2018  . Mitral valve disorders(424.0)   . Obstructive sleep apnea   . Pacemaker   . Paroxysmal atrial fibrillation ([place] 10/11/2016  . Peripheral neuropathy   . Renal disorder Kidney disease stage2  . Thyroid disease hypothyroid  . Urine incontinence   . Vitamin D deficiency    Past Surgical History:  Procedure Laterality Date  . CARDIAC CATHETERIZATION N/A 04/29/2015   Procedure: Temporary Pacemaker;  Surgeon: JAdrian Prows MD;  [facility];  Service: Cardiovascular;  Laterality: N/A;  . CARDIOVERSION N/A 09/07/2018   Procedure: CARDIOVERSION;  Surgeon: GAdrian Prows MD;  [place] [facility]  Service: Cardiovascular;  Laterality: N/A;  . CARDIOVERSION    . EP IMPLANTABLE DEVICE N/A 04/30/2015   Procedure: Pacemaker Implant;  Surgeon: GEvans Lance MD;  [facility];  Service: Cardiovascular;  Laterality: N/A;  . INTRAVASCULAR PRESSURE WIRE/FFR STUDY N/A 08/31/2017   Procedure: INTRAVASCULAR PRESSURE WIRE/FFR STUDY;  Surgeon: PNigel Mormon MD;  [facility];  Service: Cardiovascular;  Laterality: N/A;  . LEFT AND RIGHT HEART CATHETERIZATION WITH CORONARY ANGIOGRAM N/A 09/30/2011   Procedure: LEFT AND RIGHT HEART CATHETERIZATION WITH CORONARY ANGIOGRAM;  Surgeon: JLaverda Page MD;  [facility];  Service: Cardiovascular;  Laterality: N/A;  . RIGHT/LEFT HEART CATH AND CORONARY ANGIOGRAPHY N/A 08/31/2017   Procedure: RIGHT/LEFT HEART CATH AND CORONARY ANGIOGRAPHY;  Surgeon: PNigel Mormon MD;  [facility];  Service: Cardiovascular;  Laterality: N/A;  . TOOTH EXTRACTION  10/07/2018  . YAG LASER APPLICATION Right 26/73/4193  Procedure: YAG LASER APPLICATION;  Surgeon: MElta GuadeloupeT. SGershon Crane MD;  Location: [facility];  Service: Ophthalmology;  Laterality: Right;  pt knows to arrive at 11:15  . YAG LASER APPLICATION Left 37/09/238  Procedure: YAG LASER APPLICATION;  Surgeon: MRutherford Guys MD;  Location: [facility];  Service: Ophthalmology;  Laterality: Left;   Family History  Problem Relation Age of Onset  . Arthritis Mother   . Diabetes Mother   . Heart disease Mother   . Hyperlipidemia Mother   . Hypertension Mother   . Arthritis Father   . Asthma Father   . Heart attack Father   . Hyperlipidemia Father   .  Hypertension Father   . Stroke Father   . Arthritis Sister   . Diabetes Sister   . Hypertension Sister   . Arthritis Brother   . Heart attack Brother   . Heart disease Brother   . Hyperlipidemia Brother   . Hypertension Brother   . Alcohol abuse Brother   . Arthritis Brother   . Diabetes Brother   . Early death Brother   . Heart disease Brother   . Hyperlipidemia Brother   . Hypertension Brother   . Arthritis Sister   . Cancer Sister   . Diabetes Sister   . Hyperlipidemia  Sister   . Hypertension Sister   . Stroke Sister   . Cancer Sister   . Hyperlipidemia Sister   . Hypertension Sister   . Hypertension Daughter     Social History   Tobacco Use  . Smoking status: Never Smoker  . Smokeless tobacco: Never Used  Substance Use Topics  . Alcohol use: No    Alcohol/week: 0.0 standard drinks   Marital Status: Married  ROS  Review of Systems  Constitution: Positive for malaise/fatigue.  Cardiovascular: Positive for dyspnea on exertion (chronic). Negative for chest pain and leg swelling.  Gastrointestinal: Negative for melena.  Psychiatric/Behavioral: Positive for depression.   Objective  Blood pressure (!) 127/41, pulse 60, temperature 98 F (36.7 C), temperature source Temporal, resp. rate 16, height 5' 2"  (1.575 m), weight 202 lb (91.6 kg), SpO2 92 %.  Vitals with BMI 05/12/2019 05/12/2019 01/11/2019  Height - 5' 2"  5' 2"   Weight - 202 lbs 202 lbs 8 oz  BMI - 89.21 19.41  Systolic 740 814 481  Diastolic 41 41 45  Pulse 60 60 60     Physical Exam  Constitutional: She appears well-developed.  Neck: No JVD present.  Cardiovascular: Normal rate, regular rhythm, S1 normal, S2 normal and intact distal pulses. Exam reveals no gallop.  Murmur heard.  Midsystolic murmur is present with a grade of 2/6 at the upper right sternal border and apex. Pulses:      Carotid pulses are 2+ on the right side and 2+ on the left side with bruit.      Popliteal pulses are 1+ on the right side and 1+ on the left side.       Dorsalis pedis pulses are 1+ on the right side and 1+ on the left side.       Posterior tibial pulses are 1+ on the right side and 1+ on the left side.  Pulmonary/Chest: Effort normal and breath sounds normal. No accessory muscle usage. No respiratory distress.  Left infraclavicular pacemaker pocket noted   Abdominal: Soft. Bowel sounds are normal.  Musculoskeletal:        General: Normal range of motion.   Laboratory examination:   Recent  Labs    06/30/18 1205 08/31/18 1551 09/07/18 1223  NA 140 139 139  K 4.2 5.2 4.9  CL 104 100 99  CO2 26 25  --   GLUCOSE 119* 146* 149*  BUN 19 24 30*  CREATININE 1.29* 1.39* 1.50*  CALCIUM 10.1 10.5*  --   GFRNONAA 40* 37*  --   GFRAA 47* 43*  --    CrCl cannot be calculated (Patient's most recent lab result is older than the maximum 21 days allowed.).  CMP Latest Ref Rng & Units 09/07/2018 08/31/2018 06/30/2018  Glucose 70 - 99 mg/dL 149(H) 146(H) 119(H)  BUN 8 - 23 mg/dL 30(H) 24 19  Creatinine  0.44 - 1.00 mg/dL 1.50(H) 1.39(H) 1.29(H)  Sodium 135 - 145 mmol/L 139 139 140  Potassium 3.5 - 5.1 mmol/L 4.9 5.2 4.2  Chloride 98 - 111 mmol/L 99 100 104  CO2 20 - 29 mmol/L - 25 26  Calcium 8.7 - 10.3 mg/dL - 10.5(H) 10.1  Total Protein 6.5 - 8.1 g/dL - - 7.6  Total Bilirubin 0.3 - 1.2 mg/dL - - 0.3  Alkaline Phos 38 - 126 U/L - - 29(L)  AST 15 - 41 U/L - - 25  ALT 0 - 44 U/L - - 23   CBC Latest Ref Rng & Units 09/07/2018 06/30/2018 10/24/2017  WBC 4.0 - 10.5 K/uL - 6.3 8.2  Hemoglobin 12.0 - 15.0 g/dL 15.3(H) 13.7 12.1  Hematocrit 36.0 - 46.0 % 45.0 43.6 38.6  Platelets 150 - 400 K/uL - 152 128(L)   Lipid Panel     Component Value Date/Time   CHOL 168 08/29/2017 0328   TRIG 544 (H) 08/29/2017 0328   HDL 19 (L) 08/29/2017 0328   CHOLHDL 8.8 08/29/2017 0328   VLDL UNABLE TO CALCULATE IF TRIGLYCERIDE OVER 400 mg/dL 08/29/2017 0328   LDLCALC UNABLE TO CALCULATE IF TRIGLYCERIDE OVER 400 mg/dL 08/29/2017 0328   HEMOGLOBIN A1C Lab Results  Component Value Date   HGBA1C 7.3 (H) 09/30/2015   MPG 163 09/30/2015   TSH No results for input(s): TSH in the last 8760 hours.  External labs:   05/21/2018: HbA1c 6.7. Lipid Panel: Chol 146, Trig 321, HDL 17, LDL 65, VLDL 64. ALK Ph 32.   08/28/17: TSH Normal  Medications and allergies   Allergies  Allergen Reactions  . Hydrocodone Other (See Comments)    Lethargic   . Invokana [Canagliflozin] Palpitations and Other (See  Comments)    Made heart race  . Oxycodone Other (See Comments)    Lethargic      Current Outpatient Medications  Medication Instructions  . amiodarone (PACERONE) 100 mg, Oral, Daily  . ELIQUIS 5 MG TABS tablet Take 1 tablet by mouth twice daily  . fenofibrate 160 mg, Oral, Daily with supper  . insulin aspart protamine- aspart (NOVOLOG MIX 70/30) (70-30) 100 UNIT/ML injection 10-70 Units, Subcutaneous, See admin instructions, Inject 50 units into the skin with breakfast, 35 units with supper (may adjust based on blood sugar readings)  . isosorbide mononitrate (IMDUR) 30 MG 24 hr tablet Take 1 tablet by mouth once daily  . levothyroxine (SYNTHROID) 50 mcg, Oral, Daily before breakfast  . metoprolol tartrate (LOPRESSOR) 50 mg, Oral, 2 times daily  . nitroGLYCERIN (NITROSTAT) 0.4 mg, Sublingual, Every 5 min PRN  . olmesartan-hydrochlorothiazide (BENICAR HCT) 40-25 MG tablet 1 tablet, Oral, Daily  . oxybutynin (DITROPAN-XL) 5 MG 24 hr tablet TAKE 1 TABLET BY MOUTH AT BEDTIME  . spironolactone (ALDACTONE) 25 MG tablet Take 1 tablet by mouth once daily  . triamcinolone ointment (KENALOG) 0.5 % APPLY  OINTMENT TOPICALLY TO AFFECTED AREA TWICE DAILY  . vitamin B-12 (CYANOCOBALAMIN) 2,000 mcg, Oral, Daily  . Vitamin D-3 2,000 Units, Oral, Daily   Radiology:   No results found.  Cardiac Studies:   Carotid Doppler01/29/2016: No evidence of hemodynamically significant stenosis in the bilateral carotid bifurcation vessels. There is mild evidence of homogeneous plaque in bilateral carotid arteries. No significant change from 10/13/2011.  Lexiscan myoview stress test 06/15/2017: 1. Lexiscan stress test was performed. Exercise capacity was not assessed. Stress symptoms included dizziness. Peak blood pressure 158/66 mmHg. Stress EKG is non diagnostic for ischemia as  it is a pharmacologic stress. In addition, it demonstrated atrial pacing and incomplete LBBB. 2. The overall quality of the study is  excellent. There is no evidence of abnormal lung activity. Stress and rest SPECT images demonstrate homogeneous tracer distribution throughout the myocardium. Gated SPECT imaging reveals normal myocardial thickening and wall motion. The left ventricular ejection fraction was normal (71%). 3. Low risk study.  Sleep Study 08/11/2017: Positive for Complex Sleep Apnea; On CPAP   R&LHC 08/31/2017: LCx: Nondominant. AV grove LCx 50-60% diffuse disease. RCA: Large dominant. Ostial PDA 50% stenosis, FFR 0.96. No change from 09/30/2011. RA: 5 mmHg RV: 45/4 mmHg. PA: 40/12 mmHg. Mean PA 29 mmHg PCWP: 16 mmHg LV: 130/3 mmHg, LVEDP 13 mmHg CO: 3.6 L/min. CI 1.9 L/min/m2  Direct current cardioversion 09/07/2018: Indication symptomatic A. Flutter. Procedure: Using 50 mg of IV Propofol and 20 IV Lidocaine (for reducing venous pain) for achieving deep sedation, synchronized direct current cardioversion performed. Patient was delivered with 120 Joules of electricity X 1 with success to A-Paced Rhythm. Patient tolerated the procedure well. No immediate complication noted.   Scheduled In office pacemaker check 08/30/2018:  There were 6 AHR Episodes Since 08/08/2018, EGM recordings reveal maximum a rate between 180-201 bpm,, The longest His persistent since 08/30/2018,Atrial rate 180, ventricular rate 1 28 bpm, EGM's suggest atrial tachycardia/AFL.  There was 0.4% truly due to atrial arrhythmia burden, pacemaker thresholds and impedance stable.  Longevity 8.5, 12 years.  Echocardiogram 11/03/2018: Left ventricle cavity is normal in size. Moderate concentric hypertrophy of the left ventricle. Normal LV systolic function with EF 56%. Normal global wall motion. Unable to evaluate diastolic function due to E/A fusion.  Left atrial cavity is severely dilated. Trace aortic regurgitation. Moderate (Grade III) mitral regurgitation. Mild mitral valve leaflet thickening. Moderate tricuspid regurgitation. Estimated  pulmonary artery systolic pressure is 40 mmHg.  No significant change compared to previous study on 07/01/2017.   Scheduled Remote pacemaker check  03/17/2018: No mode switches. No VHR episodes. Health trends do not demonstrate significant abnormality. Battery longevity is 10 years. RA pacing is 99.5 %, RV pacing is 11.4 %.  EKG    01/11/2019: Atrial paced rhythm with occasional sinus beats with first degree AV block at 60 bpm, left anterior fasicular block, Incomplete left bundle branch block, LVH. ST-T wave abnormality in lateral leads, cannot exclude ischemia.   08/30/2018: Atypical atrial flutter with 3:1 conduction, V rate 110 bpm, left axis deviation. LBBB.  Assessment     ICD-10-CM   1. Paroxysmal atrial fibrillation (HCC)  I48.0 amiodarone (PACERONE) 200 MG tablet  2. Dyspnea on exertion  R06.00   3. Cardiac pacemaker in situ on 04/30/2015 [employer] Adapta Dr Peggyann Juba; Leads only MRI compatible: Sinus node dysfunction  Z95.0   4. Coronary artery disease involving native coronary artery of native heart without angina pectoris  I25.10     Meds ordered this encounter  Medications  . amiodarone (PACERONE) 200 MG tablet    Sig: Take 0.5 tablets (100 mg total) by mouth daily.    Dispense:  90 tablet    Refill:  1    Medications Discontinued During This Encounter  Medication Reason  . nystatin cream (MYCOSTATIN) No longer needed (for PRN medications)  . rosuvastatin (CRESTOR) 10 MG tablet Change in therapy  . SHINGRIX injection Completed Course  . torsemide (DEMADEX) 20 MG tablet No longer needed (for PRN medications)  . PACERONE 200 MG tablet     Recommendations:   [name]  is a 74 y.o. female  with hypertension, type 2 diabetes mellitus with stage 3 CKD, mild hyperlipidemia on Tricor, nonobstructive CAD (Cath 2019), sick sinus syndrome status post dual-chamber pacemaker 2017, OSA on BiPAP, paroxysmal Afib and atrial flutter/atrial tachycardia.  Her last cardioversion was  09/07/2018 for atrial flutter.  She now presents here for a 19-monthoffice visit.  She is presently doing well, she has not had any recurrence of worsening fatigue, palpitations, recent pacemaker transmission also reveals maintenance of sinus rhythm.  I will decrease the dose of amiodarone from 200 mg to 100 g daily.  Labs have been stable in the past 6 months.  Blood pressure is also well controlled.  Her dyspnea is related to underlying deconditioning, obesity hypoventilation.  She does have obstructive sleep apnea and has been compliant with BiPAP use.  No clinical evidence of heart failure.  I will see her back in 6 months.  JAdrian Prows MD, FUniversity [facility]als [facility] Center4/29/2021, 11:18 AM [facility]. PCreeksideOffice: [phone]

## 2019-05-12 ENCOUNTER — Ambulatory Visit: Payer: Medicare HMO | Admitting: Cardiology

## 2019-05-12 ENCOUNTER — Other Ambulatory Visit: Payer: Self-pay

## 2019-05-12 ENCOUNTER — Encounter: Payer: Self-pay | Admitting: Cardiology

## 2019-05-12 VITALS — BP 127/41 | HR 60 | Temp 98.0°F | Resp 16 | Ht 62.0 in | Wt 202.0 lb

## 2019-05-12 DIAGNOSIS — I2510 Atherosclerotic heart disease of native coronary artery without angina pectoris: Secondary | ICD-10-CM

## 2019-05-12 DIAGNOSIS — R0609 Other forms of dyspnea: Secondary | ICD-10-CM

## 2019-05-12 DIAGNOSIS — Z950 Presence of cardiac pacemaker: Secondary | ICD-10-CM

## 2019-05-12 DIAGNOSIS — I480 Paroxysmal atrial fibrillation: Secondary | ICD-10-CM | POA: Diagnosis not present

## 2019-05-12 DIAGNOSIS — R0600 Dyspnea, unspecified: Secondary | ICD-10-CM

## 2019-05-12 MED ORDER — AMIODARONE HCL 200 MG PO TABS: 100.0000 mg | ORAL_TABLET | Freq: Every day | ORAL | 1 refills | Status: DC

## 2019-05-20 DIAGNOSIS — G4733 Obstructive sleep apnea (adult) (pediatric): Secondary | ICD-10-CM | POA: Diagnosis not present

## 2019-05-23 ENCOUNTER — Other Ambulatory Visit: Payer: Self-pay | Admitting: Cardiology

## 2019-06-02 DIAGNOSIS — R069 Unspecified abnormalities of breathing: Secondary | ICD-10-CM | POA: Diagnosis not present

## 2019-06-02 DIAGNOSIS — I5033 Acute on chronic diastolic (congestive) heart failure: Secondary | ICD-10-CM | POA: Diagnosis not present

## 2019-06-07 DIAGNOSIS — G4733 Obstructive sleep apnea (adult) (pediatric): Secondary | ICD-10-CM | POA: Diagnosis not present

## 2019-06-09 DIAGNOSIS — Z794 Long term (current) use of insulin: Secondary | ICD-10-CM | POA: Diagnosis not present

## 2019-06-09 DIAGNOSIS — E039 Hypothyroidism, unspecified: Secondary | ICD-10-CM | POA: Diagnosis not present

## 2019-06-09 DIAGNOSIS — I10 Essential (primary) hypertension: Secondary | ICD-10-CM | POA: Diagnosis not present

## 2019-06-09 DIAGNOSIS — E781 Pure hyperglyceridemia: Secondary | ICD-10-CM | POA: Diagnosis not present

## 2019-06-09 DIAGNOSIS — E1122 Type 2 diabetes mellitus with diabetic chronic kidney disease: Secondary | ICD-10-CM | POA: Diagnosis not present

## 2019-06-09 DIAGNOSIS — E119 Type 2 diabetes mellitus without complications: Secondary | ICD-10-CM | POA: Diagnosis not present

## 2019-06-15 DIAGNOSIS — I495 Sick sinus syndrome: Secondary | ICD-10-CM | POA: Diagnosis not present

## 2019-06-15 DIAGNOSIS — Z950 Presence of cardiac pacemaker: Secondary | ICD-10-CM | POA: Diagnosis not present

## 2019-06-15 DIAGNOSIS — Z45018 Encounter for adjustment and management of other part of cardiac pacemaker: Secondary | ICD-10-CM | POA: Diagnosis not present

## 2019-06-16 ENCOUNTER — Telehealth: Payer: Self-pay | Admitting: Cardiology

## 2019-06-16 DIAGNOSIS — E875 Hyperkalemia: Secondary | ICD-10-CM | POA: Diagnosis not present

## 2019-06-16 DIAGNOSIS — N183 Chronic kidney disease, stage 3 unspecified: Secondary | ICD-10-CM | POA: Diagnosis not present

## 2019-06-16 DIAGNOSIS — I4891 Unspecified atrial fibrillation: Secondary | ICD-10-CM | POA: Diagnosis not present

## 2019-06-16 DIAGNOSIS — Z0000 Encounter for general adult medical examination without abnormal findings: Secondary | ICD-10-CM | POA: Diagnosis not present

## 2019-06-16 DIAGNOSIS — E1122 Type 2 diabetes mellitus with diabetic chronic kidney disease: Secondary | ICD-10-CM | POA: Diagnosis not present

## 2019-06-16 DIAGNOSIS — I25118 Atherosclerotic heart disease of native coronary artery with other forms of angina pectoris: Secondary | ICD-10-CM | POA: Diagnosis not present

## 2019-06-16 DIAGNOSIS — S81811A Laceration without foreign body, right lower leg, initial encounter: Secondary | ICD-10-CM | POA: Diagnosis not present

## 2019-06-16 DIAGNOSIS — E785 Hyperlipidemia, unspecified: Secondary | ICD-10-CM | POA: Diagnosis not present

## 2019-06-16 DIAGNOSIS — I5032 Chronic diastolic (congestive) heart failure: Secondary | ICD-10-CM | POA: Diagnosis not present

## 2019-06-20 DIAGNOSIS — G4733 Obstructive sleep apnea (adult) (pediatric): Secondary | ICD-10-CM | POA: Diagnosis not present

## 2019-06-21 NOTE — Telephone Encounter (Signed)
Called and spoke with patient regarding her pacemaker check results.  

## 2019-06-24 DIAGNOSIS — E039 Hypothyroidism, unspecified: Secondary | ICD-10-CM | POA: Diagnosis not present

## 2019-06-24 DIAGNOSIS — E1122 Type 2 diabetes mellitus with diabetic chronic kidney disease: Secondary | ICD-10-CM | POA: Diagnosis not present

## 2019-07-01 DIAGNOSIS — I129 Hypertensive chronic kidney disease with stage 1 through stage 4 chronic kidney disease, or unspecified chronic kidney disease: Secondary | ICD-10-CM | POA: Diagnosis not present

## 2019-07-01 DIAGNOSIS — E1122 Type 2 diabetes mellitus with diabetic chronic kidney disease: Secondary | ICD-10-CM | POA: Diagnosis not present

## 2019-07-01 DIAGNOSIS — E559 Vitamin D deficiency, unspecified: Secondary | ICD-10-CM | POA: Diagnosis not present

## 2019-07-01 DIAGNOSIS — E785 Hyperlipidemia, unspecified: Secondary | ICD-10-CM | POA: Diagnosis not present

## 2019-07-01 DIAGNOSIS — E039 Hypothyroidism, unspecified: Secondary | ICD-10-CM | POA: Diagnosis not present

## 2019-07-03 DIAGNOSIS — I5033 Acute on chronic diastolic (congestive) heart failure: Secondary | ICD-10-CM | POA: Diagnosis not present

## 2019-07-03 DIAGNOSIS — R069 Unspecified abnormalities of breathing: Secondary | ICD-10-CM | POA: Diagnosis not present

## 2019-07-20 DIAGNOSIS — G4733 Obstructive sleep apnea (adult) (pediatric): Secondary | ICD-10-CM | POA: Diagnosis not present

## 2019-07-24 ENCOUNTER — Other Ambulatory Visit: Payer: Self-pay | Admitting: Cardiology

## 2019-07-27 ENCOUNTER — Other Ambulatory Visit: Payer: Self-pay | Admitting: Cardiology

## 2019-08-02 DIAGNOSIS — R069 Unspecified abnormalities of breathing: Secondary | ICD-10-CM | POA: Diagnosis not present

## 2019-08-02 DIAGNOSIS — I5033 Acute on chronic diastolic (congestive) heart failure: Secondary | ICD-10-CM | POA: Diagnosis not present

## 2019-08-10 ENCOUNTER — Other Ambulatory Visit: Payer: Self-pay | Admitting: Cardiology

## 2019-08-14 ENCOUNTER — Other Ambulatory Visit: Payer: Self-pay | Admitting: Cardiology

## 2019-08-20 DIAGNOSIS — G4733 Obstructive sleep apnea (adult) (pediatric): Secondary | ICD-10-CM | POA: Diagnosis not present

## 2019-08-21 ENCOUNTER — Other Ambulatory Visit: Payer: Self-pay | Admitting: Cardiology

## 2019-08-26 ENCOUNTER — Other Ambulatory Visit: Payer: Self-pay

## 2019-08-26 ENCOUNTER — Encounter: Payer: Self-pay | Admitting: Internal Medicine

## 2019-08-26 ENCOUNTER — Ambulatory Visit: Payer: Medicare HMO | Admitting: Internal Medicine

## 2019-08-26 ENCOUNTER — Other Ambulatory Visit (HOSPITAL_COMMUNITY)
Admission: RE | Admit: 2019-08-26 | Discharge: 2019-08-26 | Disposition: A | Discharge status: Home / Self Care | Payer: Medicare HMO | Source: Ambulatory Visit | Attending: Internal Medicine | Admitting: Internal Medicine

## 2019-08-26 ENCOUNTER — Ambulatory Visit (HOSPITAL_COMMUNITY)
Admission: RE | Admit: 2019-08-26 | Discharge: 2019-08-26 | Disposition: A | Discharge status: Home / Self Care | Payer: Medicare HMO | Source: Ambulatory Visit | Attending: Internal Medicine | Admitting: Internal Medicine

## 2019-08-26 DIAGNOSIS — I517 Cardiomegaly: Secondary | ICD-10-CM | POA: Diagnosis not present

## 2019-08-26 DIAGNOSIS — R0600 Dyspnea, unspecified: Secondary | ICD-10-CM

## 2019-08-26 DIAGNOSIS — R0609 Other forms of dyspnea: Secondary | ICD-10-CM

## 2019-08-26 DIAGNOSIS — R0602 Shortness of breath: Secondary | ICD-10-CM | POA: Diagnosis not present

## 2019-08-26 LAB — CBC WITH DIFFERENTIAL/PLATELET
Abs Immature Granulocytes: 0.02 10*3/uL (ref 0.00–0.07)
Basophils Absolute: 0 10*3/uL (ref 0.0–0.1)
Basophils Relative: 0 %
Eosinophils Absolute: 0.1 10*3/uL (ref 0.0–0.5)
Eosinophils Relative: 1 %
HCT: 43 % (ref 36.0–46.0)
Hemoglobin: 13.6 g/dL (ref 12.0–15.0)
Immature Granulocytes: 0 %
Lymphocytes Relative: 26 %
Lymphs Abs: 1.9 10*3/uL (ref 0.7–4.0)
MCH: 30.5 pg (ref 26.0–34.0)
MCHC: 31.6 g/dL (ref 30.0–36.0)
MCV: 96.4 fL (ref 80.0–100.0)
Monocytes Absolute: 0.7 10*3/uL (ref 0.1–1.0)
Monocytes Relative: 9 %
Neutro Abs: 4.5 10*3/uL (ref 1.7–7.7)
Neutrophils Relative %: 64 %
Platelets: 155 10*3/uL (ref 150–400)
RBC: 4.46 MIL/uL (ref 3.87–5.11)
RDW: 13.7 % (ref 11.5–15.5)
WBC: 7.1 10*3/uL (ref 4.0–10.5)
nRBC: 0 % (ref 0.0–0.2)

## 2019-08-26 LAB — BASIC METABOLIC PANEL
Anion gap: 9 (ref 5–15)
BUN: 30 mg/dL — ABNORMAL HIGH (ref 8–23)
CO2: 27 mmol/L (ref 22–32)
Calcium: 10.3 mg/dL (ref 8.9–10.3)
Chloride: 100 mmol/L (ref 98–111)
Creatinine, Ser: 1.34 mg/dL — ABNORMAL HIGH (ref 0.44–1.00)
GFR calc Af Amer: 44 mL/min — ABNORMAL LOW (ref >60)
GFR calc non Af Amer: 38 mL/min — ABNORMAL LOW (ref >60)
Glucose, Bld: 176 mg/dL — ABNORMAL HIGH (ref 70–99)
Potassium: 4.7 mmol/L (ref 3.5–5.1)
Sodium: 136 mmol/L (ref 135–145)

## 2019-08-26 LAB — SEDIMENTATION RATE: Sed Rate: 11 mm/hr (ref 0–22)

## 2019-08-26 LAB — TSH: TSH: 4.404 u[IU]/mL (ref 0.350–4.500)

## 2019-08-26 LAB — BRAIN NATRIURETIC PEPTIDE: B Natriuretic Peptide: 186 pg/mL — ABNORMAL HIGH (ref 0.0–100.0)

## 2019-08-26 NOTE — Assessment & Plan Note (Addendum)
Onset in her late 66s assoc with wt gain and PAF on amiodarone since 2017  - CxR c/w severe eventration Ant portion of R HD since 05/25/2003  -  Echocardiogram 11/03/2018:  Left ventricle cavity is normal in size. Moderate concentric hypertrophy  of the left ventricle. Normal LV systolic function with EF 56%. Normal  global wall motion. Left atrial cavity is severely dilated.  Trace aortic regurgitation.  Moderate (Grade III) mitral regurgitation. Mild mitral valve leaflet  thickening.  Moderate tricuspid regurgitation. Estimated pulmonary artery systolic  pressure is 40 mmHg.  No significant change compared to previous study on 07/01/2017. -  08/26/2019   Walked RA  approx  200 ft  @ slow  pace  stopped due to  Sob/ sats 96%  - PFT's rec 08/26/2019 >>>     Between her wt gain, her R HD, her MR with severe LAE and use of amiodarone chronically I'm surprised she's not more sob esp supine though that problem is being offset by bipap.  The only reversible issues she has are cardiac and wt loss and in meantime need to be on the lookout for superimposed amio lung dz  Patients typically have been on amiodarone for 6-12 months before this complication manifests.  Of note, serial clinical evaluation for symptoms such as cough dyspnea or fevers is  the preferred method of monitoring for pulmonary toxicity because a decrease in DLCO or lung volumes is a nonspecific for toxicity. Pathologically amiodarone pulmonary toxicity may appear as interstitial pneumonitis, eosinophilic pneumonia, organizing pneumonia, pulmonary fibrosis or less commonly as diffuse alveolar hemorrhage, pulmonary nodules or pleural effusions.  Risk factors for pulmonary toxicity include age greater than 43, daily dose greater than equal to 400 mg, a high cumulative dose, or pre-existing lung disease     >>> baseline pfts requested and rec:  Make sure you check your oxygen saturations at highest level of activity to be sure it stays  over 90% and keep track of it at least once a week, more often if breathing getting worse, and let me know if losing ground.           Each maintenance medication was reviewed in detail including emphasizing most importantly the difference between maintenance and prns and under what circumstances the prns are to be triggered using an action plan format where appropriate.  Total time for H and P, chart review, counseling, teaching device and generating customized AVS unique to this office visit / charting =73 min

## 2019-08-26 NOTE — Patient Instructions (Addendum)
Please remember to go to the lab and x-ray department at [facility]   for your tests - we will call you with the results when they are available and let you know where to go from here   Please schedule a follow up visit in 3 months but call sooner if needed  - PFT's on return same day

## 2019-08-26 NOTE — Progress Notes (Signed)
[name], female    DOB: [birth year], 74 y.o.   MRN: [id number]   Brief patient profile:  51 yowf never smoker no trouble as child or as  adult pregnancies and around  Mid 40s developed  year round sense of nasal congestion regardless of location/ climate then around her late  38s doe and gradually worse esp since summer 2020 so referred to pulmonary clinic in [facility]  08/26/2019 by  Binnie Kand NP with Dr Mila [facility] practice      History of Present Illness  08/26/2019  Pulmonary/ 1st office eval/[name]  Chief Complaint  Patient presents with  . Pulmonary Consult    Referred by Binnie Kand, NP. Pt c/o DOE for 10 years- worse over the past 1 year. She is "out of breath" walking from her kitchen to her bathroom.    Dyspnea:  50 ft  Cough: no Sleep: bipap and 02 4lpm / bed is flat/ 2 pillows x  5 years (started with cpap)  SABA use: none On amiodarone x 04/2015 first dose  No obvious day to day or daytime variability or assoc excess/ purulent sputum or mucus plugs or hemoptysis or cp or chest tightness, subjective wheeze or overt sinus or hb symptoms.   Sleeping as above  without nocturnal  or early am exacerbation  of respiratory  c/o's or need for noct saba. Also denies any obvious fluctuation of symptoms with weather or environmental changes or other aggravating or alleviating factors except as outlined above   No unusual exposure hx or h/o childhood pna/ asthma or knowledge of premature birth.  Current Allergies, Complete Past Medical History, Past Surgical History, Family History, and Social History were reviewed in [employer] record.  ROS  The following are not active complaints unless bolded Hoarseness, sore throat, dysphagia, dental problems, itching, sneezing,  nasal congestion or discharge of excess mucus or purulent secretions, ear ache,   fever, chills, sweats, unintended wt loss or wt gain, classically pleuritic or exertional cp,  orthopnea pnd or  arm/hand swelling  or leg swelling, presyncope, palpitations, abdominal pain, anorexia, nausea, vomiting, diarrhea  or change in bowel habits or change in bladder habits, change in stools or change in urine, dysuria, hematuria,  rash, arthralgias, visual complaints, headache, numbness, weakness or ataxia or problems with walking or coordination,  change in mood or  memory.           Past Medical History:  Diagnosis Date  . Arthritis   . Diabetes mellitus without complication ([place])   . Encounter for care of pacemaker 09/03/2018  . History of chickenpox   . Hx of psoriatic arthritis   . Hyperlipemia   . Hypertension   . Hypertension 05/03/2018  . Mitral valve disorders(424.0)   . Obstructive sleep apnea   . Pacemaker   . Paroxysmal atrial fibrillation ([place]) 10/11/2016  . Peripheral neuropathy   . Renal disorder Kidney disease stage2  . Thyroid disease hypothyroid  . Urine incontinence   . Vitamin D deficiency     Outpatient Medications Prior to Visit  Medication Sig Dispense Refill  . amiodarone (PACERONE) 200 MG tablet Take 0.5 tablets (100 mg total) by mouth daily. 90 tablet 1  . Cholecalciferol (VITAMIN D-3) 1000 units CAPS Take 2,000 Units by mouth daily.    Marland Kitchen ELIQUIS 5 MG TABS tablet Take 1 tablet by mouth twice daily 180 tablet 2  . fenofibrate 160 MG tablet Take 160 mg by mouth daily with supper.     Marland Kitchen  insulin aspart protamine- aspart (NOVOLOG MIX 70/30) (70-30) 100 UNIT/ML injection Inject 10-70 Units into the skin See admin instructions. Inject 50 units into the skin with breakfast, 35 units with supper (may adjust based on blood sugar readings)    . isosorbide mononitrate (IMDUR) 30 MG 24 hr tablet Take 1 tablet by mouth once daily 90 tablet 2  . levothyroxine (SYNTHROID, LEVOTHROID) 50 MCG tablet Take 50 mcg by mouth daily before breakfast.    . metoprolol tartrate (LOPRESSOR) 50 MG tablet Take 1 tablet by mouth twice daily 180 tablet 0  . nitroGLYCERIN (NITROSTAT) 0.4 MG SL  tablet Place 1 tablet (0.4 mg total) under the tongue every 5 (five) minutes as needed for chest pain. 25 tablet 1  . olmesartan-hydrochlorothiazide (BENICAR HCT) 40-25 MG tablet Take 1 tablet by mouth daily. 90 tablet 3  . spironolactone (ALDACTONE) 25 MG tablet Take 1 tablet by mouth once daily 90 tablet 0  . triamcinolone ointment (KENALOG) 0.5 % APPLY  OINTMENT TOPICALLY TO AFFECTED AREA TWICE DAILY 30 g 0  . vitamin B-12 (CYANOCOBALAMIN) 1000 MCG tablet Take 2,000 mcg by mouth daily.    Marland Kitchen oxybutynin (DITROPAN-XL) 5 MG 24 hr tablet TAKE 1 TABLET BY MOUTH AT BEDTIME 90 tablet 0      Objective:     BP 130/60 (BP Location: Left Arm, Cuff Size: Large)   Pulse (!) 59   Ht 5\' 2"  (1.575 m)   Wt 200 lb (90.7 kg)   SpO2 92% Comment: on RA  BMI 36.58 kg/m    Wt Readings from Last 3 Encounters:  08/26/19 200 lb (90.7 kg)  05/12/19 202 lb (91.6 kg)  01/11/19 202 lb 8 oz (91.9 kg)   10/01/15          196      SpO2: 92 % (on RA)   Pleasant amb obese wf with relatively thin legs nad   HEENT : pt wearing mask not removed for exam due to covid -19 concerns.    NECK :  without JVD/Nodes/TM/ nl carotid upstrokes bilaterally   LUNGS: no acc muscle use,  Nl contour chest which is clear to A and P bilaterally without cough on insp or exp maneuvers   CV:  RRR  no s3  2/6  HSM  or increase in P2, and no edema   ABD: obese soft and nontender with nl inspiratory excursion in the supine position. No bruits or organomegaly appreciated, bowel sounds nl  MS:  Nl gait/ ext warm without deformities, calf tenderness, cyanosis or clubbing No obvious joint restrictions   SKIN: warm and dry without lesions    NEURO:  alert, approp, nl sensorium with  no motor or cerebellar deficits apparent.    Labs ordered/ reviewed:      Chemistry      Component Value Date/Time   NA 136 08/26/2019 1055   NA 139 08/31/2018 1551   K 4.7 08/26/2019 1055   CL 100 08/26/2019 1055   CO2 27 08/26/2019 1055    BUN 30 (H) 08/26/2019 1055   BUN 24 08/31/2018 1551   CREATININE 1.34 (H) 08/26/2019 1055   CREATININE 1.29 (H) 06/30/2018 1205      Component Value Date/Time   CALCIUM 10.3 08/26/2019 1055   ALKPHOS 29 (L) 06/30/2018 1205   AST 25 06/30/2018 1205   ALT 23 06/30/2018 1205   BILITOT 0.3 06/30/2018 1205        Lab Results  Component Value Date   WBC  7.1 08/26/2019   HGB 13.6 08/26/2019   HCT 43.0 08/26/2019   MCV 96.4 08/26/2019   PLT 155 08/26/2019       EOS                                                               0.1                                    08/26/2019    Lab Results  Component Value Date   TSH 4.404 08/26/2019      BNP         08/26/2019     =   186    Lab Results  Component Value Date   ESRSEDRATE 11 08/26/2019   ESRSEDRATE 1 06/30/2018       Labs ordered 08/26/2019  :  allergy profile     CXR PA and Lateral:   08/26/2019 :    I personally reviewed images and agree with radiology impression as follows:   No acute process in the chest.        Assessment   No problem-specific Assessment & Plan notes found for this encounter.     Christinia Gully, MD 08/26/2019

## 2019-08-28 LAB — IGE: IgE (Immunoglobulin E), Serum: 10 IU/mL (ref 6–495)

## 2019-08-29 NOTE — Progress Notes (Signed)
Called and spoke with patient about lab result per Dr Melvyn Novas. All questions answered and patient expressed understanding. Nothing further needed at this time.

## 2019-08-29 NOTE — Progress Notes (Signed)
Called and spoke with patient about xray results per Dr Melvyn Novas. All questions answered and patient expressed understanding. Nothing further needed at this time.

## 2019-08-31 DIAGNOSIS — E1122 Type 2 diabetes mellitus with diabetic chronic kidney disease: Secondary | ICD-10-CM | POA: Diagnosis not present

## 2019-08-31 DIAGNOSIS — E039 Hypothyroidism, unspecified: Secondary | ICD-10-CM | POA: Diagnosis not present

## 2019-09-02 DIAGNOSIS — I5033 Acute on chronic diastolic (congestive) heart failure: Secondary | ICD-10-CM | POA: Diagnosis not present

## 2019-09-02 DIAGNOSIS — R069 Unspecified abnormalities of breathing: Secondary | ICD-10-CM | POA: Diagnosis not present

## 2019-09-08 DIAGNOSIS — Z794 Long term (current) use of insulin: Secondary | ICD-10-CM | POA: Diagnosis not present

## 2019-09-08 DIAGNOSIS — E119 Type 2 diabetes mellitus without complications: Secondary | ICD-10-CM | POA: Diagnosis not present

## 2019-09-20 DIAGNOSIS — G4733 Obstructive sleep apnea (adult) (pediatric): Secondary | ICD-10-CM | POA: Diagnosis not present

## 2019-09-22 DIAGNOSIS — Z950 Presence of cardiac pacemaker: Secondary | ICD-10-CM | POA: Diagnosis not present

## 2019-09-22 DIAGNOSIS — I495 Sick sinus syndrome: Secondary | ICD-10-CM | POA: Diagnosis not present

## 2019-09-22 DIAGNOSIS — Z45018 Encounter for adjustment and management of other part of cardiac pacemaker: Secondary | ICD-10-CM | POA: Diagnosis not present

## 2019-10-03 DIAGNOSIS — I5033 Acute on chronic diastolic (congestive) heart failure: Secondary | ICD-10-CM | POA: Diagnosis not present

## 2019-10-03 DIAGNOSIS — R069 Unspecified abnormalities of breathing: Secondary | ICD-10-CM | POA: Diagnosis not present

## 2019-10-03 DIAGNOSIS — E1165 Type 2 diabetes mellitus with hyperglycemia: Secondary | ICD-10-CM | POA: Diagnosis not present

## 2019-10-04 ENCOUNTER — Telehealth: Payer: Self-pay | Admitting: Cardiology

## 2019-10-05 NOTE — Telephone Encounter (Signed)
Called and spoke with patient regarding her remote pacemaker check results.

## 2019-10-20 DIAGNOSIS — G4733 Obstructive sleep apnea (adult) (pediatric): Secondary | ICD-10-CM | POA: Diagnosis not present

## 2019-10-26 DIAGNOSIS — G4733 Obstructive sleep apnea (adult) (pediatric): Secondary | ICD-10-CM | POA: Diagnosis not present

## 2019-10-30 ENCOUNTER — Other Ambulatory Visit: Payer: Self-pay | Admitting: Adult Health

## 2019-11-02 DIAGNOSIS — I5033 Acute on chronic diastolic (congestive) heart failure: Secondary | ICD-10-CM | POA: Diagnosis not present

## 2019-11-02 DIAGNOSIS — R069 Unspecified abnormalities of breathing: Secondary | ICD-10-CM | POA: Diagnosis not present

## 2019-11-10 ENCOUNTER — Other Ambulatory Visit: Payer: Self-pay

## 2019-11-10 ENCOUNTER — Encounter: Payer: Self-pay | Admitting: Cardiology

## 2019-11-10 ENCOUNTER — Ambulatory Visit: Payer: Medicare HMO | Admitting: Cardiology

## 2019-11-10 VITALS — BP 140/50 | HR 60 | Resp 16 | Ht 62.0 in | Wt 199.0 lb

## 2019-11-10 DIAGNOSIS — I5032 Chronic diastolic (congestive) heart failure: Secondary | ICD-10-CM

## 2019-11-10 DIAGNOSIS — I2510 Atherosclerotic heart disease of native coronary artery without angina pectoris: Secondary | ICD-10-CM

## 2019-11-10 DIAGNOSIS — I480 Paroxysmal atrial fibrillation: Secondary | ICD-10-CM | POA: Diagnosis not present

## 2019-11-10 DIAGNOSIS — Z950 Presence of cardiac pacemaker: Secondary | ICD-10-CM | POA: Diagnosis not present

## 2019-11-10 DIAGNOSIS — I10 Essential (primary) hypertension: Secondary | ICD-10-CM | POA: Diagnosis not present

## 2019-11-10 MED ORDER — ISOSORBIDE MONONITRATE ER 60 MG PO TB24: 60.0000 mg | ORAL_TABLET | Freq: Every day | ORAL | 3 refills | Status: DC

## 2019-11-10 NOTE — Progress Notes (Signed)
Primary Physician/Referring:  Deland Pretty, MD  Patient ID: [name], female    DOB: 1945-12-03, 74 y.o.   MRN: [id number]  Chief Complaint  Patient presents with  . Atrial Fibrillation  . Hypertension  . Follow-up    6 month   HPI:    [name]  is a 74 y.o. female  with hypertension, type 2 diabetes mellitus with stage 3 CKD, chronic diastolic heart failure, mild hyperlipidemia on Tricor, nonobstructive CAD (Cath 2019), sick sinus syndrome status post dual-chamber pacemaker 2017, OSA on BiPAP, paroxysmal Afib and atrial flutter/atrial tachycardia.  Her last cardioversion was 09/07/2018 for atrial flutter.  She now presents here for a 63-monthoffice visit.  She is under a lot of stress in caring for her husband that is disabled and has dementia.   Presently doing well except feels fatigued and tired due to stress.  Past Medical History:  Diagnosis Date  . Arthritis   . Diabetes mellitus without complication ([place]   . Encounter for care of pacemaker 09/03/2018  . History of chickenpox   . Hx of psoriatic arthritis   . Hyperlipemia   . Hypertension   . Hypertension 05/03/2018  . Mitral valve disorders(424.0)   . Obstructive sleep apnea   . Pacemaker   . Paroxysmal atrial fibrillation ([place] 10/11/2016  . Peripheral neuropathy   . Renal disorder Kidney disease stage2  . Thyroid disease hypothyroid  . Urine incontinence   . Vitamin D deficiency    Past Surgical History:  Procedure Laterality Date  . CARDIAC CATHETERIZATION N/A 04/29/2015   Procedure: Temporary Pacemaker;  Surgeon: JAdrian Prows MD;  [facility];  Service: Cardiovascular;  Laterality: N/A;  . CARDIOVERSION N/A 09/07/2018   Procedure: CARDIOVERSION;  Surgeon: GAdrian Prows MD;  [facility]  Service: Cardiovascular;  Laterality: N/A;  . CARDIOVERSION    . EP IMPLANTABLE DEVICE N/A 04/30/2015   Procedure: Pacemaker Implant;  Surgeon: GEvans Lance MD;  [facility];  Service: Cardiovascular;  Laterality: N/A;  . INTRAVASCULAR PRESSURE WIRE/FFR STUDY N/A 08/31/2017   Procedure: INTRAVASCULAR PRESSURE WIRE/FFR STUDY;  Surgeon: PNigel Mormon MD;  [facility];  Service: Cardiovascular;  Laterality: N/A;  . LEFT AND RIGHT HEART CATHETERIZATION WITH CORONARY ANGIOGRAM N/A 09/30/2011   Procedure: LEFT AND RIGHT HEART CATHETERIZATION WITH CORONARY ANGIOGRAM;  Surgeon: JLaverda Page MD;  [facility];  Service: Cardiovascular;  Laterality: N/A;  . RIGHT/LEFT HEART CATH AND CORONARY ANGIOGRAPHY N/A 08/31/2017   Procedure: RIGHT/LEFT HEART CATH AND CORONARY ANGIOGRAPHY;  Surgeon: PNigel Mormon MD;  [facility];  Service: Cardiovascular;  Laterality: N/A;  . TOOTH EXTRACTION  10/07/2018  . YAG LASER APPLICATION Right 25/39/7673  Procedure: YAG LASER APPLICATION;  Surgeon: MElta GuadeloupeT. SGershon Crane MD;  Location: [facility];  Service: Ophthalmology;  Laterality: Right;  pt knows to arrive at 11:15  . YAG LASER APPLICATION Left 34/01/9377  Procedure: YAG LASER APPLICATION;  Surgeon: MRutherford Guys MD;  Location: [facility];  Service: Ophthalmology;  Laterality: Left;   Family History  Problem Relation Age of Onset  . Arthritis Mother   . Diabetes Mother   . Heart disease Mother   . Hyperlipidemia Mother   . Hypertension Mother   . Arthritis Father   . Asthma Father   . Heart attack Father   . Hyperlipidemia Father   . Hypertension Father   . Stroke Father   .  Arthritis Sister   . Diabetes Sister   . Hypertension Sister   . Arthritis Brother   . Heart attack Brother   . Heart disease Brother   . Hyperlipidemia Brother   . Hypertension Brother   . Alcohol abuse Brother   . Arthritis Brother   . Diabetes Brother   . Early death Brother   . Heart disease Brother   . Hyperlipidemia Brother   . Hypertension Brother   . Arthritis Sister   . Cancer Sister   . Diabetes Sister   . Hyperlipidemia Sister   .  Hypertension Sister   . Stroke Sister   . Cancer Sister   . Hyperlipidemia Sister   . Hypertension Sister   . Hypertension Daughter     Social History   Tobacco Use  . Smoking status: Never Smoker  . Smokeless tobacco: Never Used  Substance Use Topics  . Alcohol use: No    Alcohol/week: 0.0 standard drinks   Marital Status: Married  ROS  Review of Systems  Constitutional: Positive for malaise/fatigue.  Cardiovascular: Positive for dyspnea on exertion (chronic). Negative for chest pain and leg swelling.  Gastrointestinal: Negative for melena.  Psychiatric/Behavioral: Positive for depression.   Objective  Blood pressure (!) 140/50, pulse 60, resp. rate 16, height 5' 2"  (1.575 m), weight 199 lb (90.3 kg), SpO2 96 %.  Vitals with BMI 11/10/2019 08/26/2019 05/12/2019  Height 5' 2"  5' 2"  -  Weight 199 lbs 200 lbs -  BMI 23.55 73.22 -  Systolic 025 427 062  Diastolic 50 60 41  Pulse 60 59 60     Physical Exam Constitutional:      Appearance: She is well-developed.  Neck:     Vascular: No JVD.  Cardiovascular:     Rate and Rhythm: Normal rate and regular rhythm.     Pulses: Intact distal pulses.          Carotid pulses are 2+ on the right side and 2+ on the left side with bruit.      Popliteal pulses are 1+ on the right side and 1+ on the left side.       Dorsalis pedis pulses are 1+ on the right side and 1+ on the left side.       Posterior tibial pulses are 1+ on the right side and 1+ on the left side.     Heart sounds: S1 normal and S2 normal. Murmur heard.  Midsystolic murmur is present with a grade of 2/6 at the upper right sternal border and apex.  No gallop.   Pulmonary:     Effort: Pulmonary effort is normal. No accessory muscle usage or respiratory distress.     Breath sounds: Normal breath sounds.  Abdominal:     General: Bowel sounds are normal.     Palpations: Abdomen is soft.  Musculoskeletal:        General: Normal range of motion.    Laboratory  examination:   Recent Labs    08/26/19 1055  NA 136  K 4.7  CL 100  CO2 27  GLUCOSE 176*  BUN 30*  CREATININE 1.34*  CALCIUM 10.3  GFRNONAA 38*  GFRAA 44*   CrCl cannot be calculated (Patient's most recent lab result is older than the maximum 21 days allowed.).  CMP Latest Ref Rng & Units 08/26/2019 09/07/2018 08/31/2018  Glucose 70 - 99 mg/dL 176(H) 149(H) 146(H)  BUN 8 - 23 mg/dL 30(H) 30(H) 24  Creatinine 0.44 - 1.00 mg/dL  1.34(H) 1.50(H) 1.39(H)  Sodium 135 - 145 mmol/L 136 139 139  Potassium 3.5 - 5.1 mmol/L 4.7 4.9 5.2  Chloride 98 - 111 mmol/L 100 99 100  CO2 22 - 32 mmol/L 27 - 25  Calcium 8.9 - 10.3 mg/dL 10.3 - 10.5(H)  Total Protein 6.5 - 8.1 g/dL - - -  Total Bilirubin 0.3 - 1.2 mg/dL - - -  Alkaline Phos 38 - 126 U/L - - -  AST 15 - 41 U/L - - -  ALT 0 - 44 U/L - - -   CBC Latest Ref Rng & Units 08/26/2019 09/07/2018 06/30/2018  WBC 4.0 - 10.5 K/uL 7.1 - 6.3  Hemoglobin 12.0 - 15.0 g/dL 13.6 15.3(H) 13.7  Hematocrit 36 - 46 % 43.0 45.0 43.6  Platelets 150 - 400 K/uL 155 - 152   Lipid Panel     Component Value Date/Time   CHOL 168 08/29/2017 0328   TRIG 544 (H) 08/29/2017 0328   HDL 19 (L) 08/29/2017 0328   CHOLHDL 8.8 08/29/2017 0328   VLDL UNABLE TO CALCULATE IF TRIGLYCERIDE OVER 400 mg/dL 08/29/2017 0328   LDLCALC UNABLE TO CALCULATE IF TRIGLYCERIDE OVER 400 mg/dL 08/29/2017 0328   HEMOGLOBIN A1C Lab Results  Component Value Date   HGBA1C 7.3 (H) 09/30/2015   MPG 163 09/30/2015   TSH Recent Labs    08/26/19 1054  TSH 4.404    External labs:   Cholesterol, total 168.000 m 08/29/2017 HDL 17.000 mg 06/09/2019 LDL-C 100.000 ca 06/09/2019 Triglycerides 343.000 m 06/09/2019  A1C 7.200 % 06/24/2019 TSH 5.100 08/31/2019  Hemoglobin 13.600 g/d 08/26/2019 Platelets 155.000 K/ 08/26/2019  Creatinine, Serum 1.340 mg/ 08/26/2019 Potassium 4.700 mm 08/26/2019 Magnesium 1.700 MG/ 04/24/2015 ALT (SGPT) 21.000 IU/ 06/24/2019  05/21/2018: HbA1c 6.7. Lipid  Panel: Chol 146, Trig 321, HDL 17, LDL 65, VLDL 64. ALK Ph 32.   08/28/17: TSH Normal  Medications and allergies   Allergies  Allergen Reactions  . Hydrocodone Other (See Comments)    Lethargic   . Invokana [Canagliflozin] Palpitations and Other (See Comments)    Made heart race  . Oxycodone Other (See Comments)    Lethargic      Current Outpatient Medications  Medication Instructions  . amiodarone (PACERONE) 100 mg, Oral, Daily  . ELIQUIS 5 MG TABS tablet Take 1 tablet by mouth twice daily  . fenofibrate 160 mg, Oral, Daily with supper  . insulin aspart protamine- aspart (NOVOLOG MIX 70/30) (70-30) 100 UNIT/ML injection 10-70 Units, Subcutaneous, See admin instructions, Inject 50 units into the skin with breakfast, 35 units with supper (may adjust based on blood sugar readings)  . isosorbide mononitrate (IMDUR) 60 mg, Oral, Daily  . levothyroxine (SYNTHROID) 50 mcg, Oral, Daily before breakfast  . metoprolol tartrate (LOPRESSOR) 50 MG tablet Take 1 tablet by mouth twice daily  . nitroGLYCERIN (NITROSTAT) 0.4 mg, Sublingual, Every 5 min PRN  . olmesartan-hydrochlorothiazide (BENICAR HCT) 40-25 MG tablet 1 tablet, Oral, Daily  . Omega-3 Fatty Acids (FISH OIL OMEGA-3 PO) Oral, Daily  . spironolactone (ALDACTONE) 25 MG tablet Take 1 tablet by mouth once daily  . SURE COMFORT INSULIN SYRINGE 31G X 5/16" 0.5 ML MISC No dose, route, or frequency recorded.  . triamcinolone ointment (KENALOG) 0.5 % APPLY  OINTMENT TOPICALLY TO AFFECTED AREA TWICE DAILY  . vitamin B-12 (CYANOCOBALAMIN) 2,000 mcg, Oral, Daily  . Vitamin D-3 2,000 Units, Oral, Daily   Radiology:   No results found.  Cardiac Studies:   Carotid Doppler01/29/2016: No  evidence of hemodynamically significant stenosis in the bilateral carotid bifurcation vessels. There is mild evidence of homogeneous plaque in bilateral carotid arteries. No significant change from 10/13/2011.  Lexiscan myoview stress test 06/15/2017: 1.  Lexiscan stress test was performed. Exercise capacity was not assessed. Stress symptoms included dizziness. Peak blood pressure 158/66 mmHg. Stress EKG is non diagnostic for ischemia as it is a pharmacologic stress. In addition, it demonstrated atrial pacing and incomplete LBBB. 2. The overall quality of the study is excellent. There is no evidence of abnormal lung activity. Stress and rest SPECT images demonstrate homogeneous tracer distribution throughout the myocardium. Gated SPECT imaging reveals normal myocardial thickening and wall motion. The left ventricular ejection fraction was normal (71%). 3. Low risk study.  Sleep Study 08/11/2017: Positive for Complex Sleep Apnea; On CPAP   R&LHC 08/31/2017: LCx: Nondominant. AV grove LCx 50-60% diffuse disease. RCA: Large dominant. Ostial PDA 50% stenosis, FFR 0.96. No change from 09/30/2011. RA: 5 mmHg RV: 45/4 mmHg. PA: 40/12 mmHg. Mean PA 29 mmHg PCWP: 16 mmHg LV: 130/3 mmHg, LVEDP 13 mmHg CO: 3.6 L/min. CI 1.9 L/min/m2  Direct current cardioversion 09/07/2018: Indication symptomatic A. Flutter. Procedure: Using 50 mg of IV Propofol and 20 IV Lidocaine (for reducing venous pain) for achieving deep sedation, synchronized direct current cardioversion performed. Patient was delivered with 120 Joules of electricity X 1 with success to A-Paced Rhythm. Patient tolerated the procedure well. No immediate complication noted.   Scheduled In office pacemaker check 08/30/2018:  There were 6 AHR Episodes Since 08/08/2018, EGM recordings reveal maximum a rate between 180-201 bpm,, The longest His persistent since 08/30/2018,Atrial rate 180, ventricular rate 1 28 bpm, EGM's suggest atrial tachycardia/AFL.  There was 0.4% truly due to atrial arrhythmia burden, pacemaker thresholds and impedance stable.  Longevity 8.5, 12 years.  Echocardiogram 11/03/2018: Left ventricle cavity is normal in size. Moderate concentric hypertrophy of the left ventricle. Normal LV  systolic function with EF 56%. Normal global wall motion. Unable to evaluate diastolic function due to E/A fusion.  Left atrial cavity is severely dilated. Trace aortic regurgitation. Moderate (Grade III) mitral regurgitation. Mild mitral valve leaflet thickening. Moderate tricuspid regurgitation. Estimated pulmonary artery systolic pressure is 40 mmHg.  No significant change compared to previous study on 07/01/2017.   Scheduled Remote pacemaker check  09/22/2019:  No mode switches. No VHR episodes. Health trends do not demonstrate significant abnormality. Battery longevity is 9.5 years. RA pacing is 99.7 %, RV pacing is 8.9 %.  EKG:   EKG 11/10/2019: Atrially paced rhythm with first-degree AV block, ventricularly sensed.  Ventricular rate 60 bpm.  Left axis deviation, left intrafascicular block.  Anteroseptal infarct old.  IVCD, borderline criteria for LVH.  Nonspecific T abnormality.  No significant change from 01/11/2019.  08/30/2018: Atypical atrial flutter with 3:1 conduction, V rate 110 bpm, left axis deviation. LBBB.  Assessment     ICD-10-CM   1. Paroxysmal atrial fibrillation (HCC)  I48.0 EKG 12-Lead  2. Coronary artery disease involving native coronary artery of native heart without angina pectoris  I25.10 isosorbide mononitrate (IMDUR) 60 MG 24 hr tablet  3. Essential hypertension  I10 isosorbide mononitrate (IMDUR) 60 MG 24 hr tablet  4. Chronic diastolic heart failure (HCC)  I50.32   5. Pacemaker  Medtronic  Medtronic Adapta Dr Peggyann Juba, leads only MRI  in situ 04/30/2015  Z95.0     Meds ordered this encounter  Medications  . isosorbide mononitrate (IMDUR) 60 MG 24 hr tablet    Sig: Take  1 tablet (60 mg total) by mouth daily.    Dispense:  90 tablet    Refill:  3    Medications Discontinued During This Encounter  Medication Reason  . isosorbide mononitrate (IMDUR) 30 MG 24 hr tablet Reorder    Recommendations:   ANALUISA TUDOR  is a 74 y.o. female  with hypertension,  type 2 diabetes mellitus with stage 3 CKD, chronic diastolic heart failure, mild hyperlipidemia on Tricor, nonobstructive CAD (Cath 2019), sick sinus syndrome status post dual-chamber pacemaker 2017, OSA on BiPAP, paroxysmal Afib and atrial flutter/atrial tachycardia.  Her last cardioversion was 09/07/2018 for atrial flutter.  She now presents here for a 4-monthoffice visit.  She is presently doing well, she has not had any recurrence of worsening fatigue, palpitations, recent pacemaker transmission also reveals maintenance of sinus rhythm.  Blood pressure slightly elevated today, will increase isosorbide mononitrate from 30 mg to 60 mg daily.  No clinical evidence of heart failure, she is tolerating anticoagulation without bleeding diathesis.  External labs reviewed, renal function has remained stable and CBC is normal.  Elevated triglycerides related to uncontrolled diabetes mellitus.  She does have obstructive sleep apnea and has been compliant with BiPAP use.  No clinical evidence of heart failure.  I will see her back in 6 months.   JAdrian Prows MD, FSt [facility]10/28/2021, 12:07 PM Office: [phone]: [phone]

## 2019-11-20 DIAGNOSIS — G4733 Obstructive sleep apnea (adult) (pediatric): Secondary | ICD-10-CM | POA: Diagnosis not present

## 2019-11-22 ENCOUNTER — Other Ambulatory Visit: Payer: Self-pay | Admitting: Cardiology

## 2019-11-28 ENCOUNTER — Other Ambulatory Visit: Payer: Self-pay

## 2019-11-28 MED ORDER — METOPROLOL TARTRATE 50 MG PO TABS: 50.0000 mg | ORAL_TABLET | Freq: Two times a day (BID) | ORAL | 3 refills | Status: DC

## 2019-12-03 DIAGNOSIS — R069 Unspecified abnormalities of breathing: Secondary | ICD-10-CM | POA: Diagnosis not present

## 2019-12-03 DIAGNOSIS — I5033 Acute on chronic diastolic (congestive) heart failure: Secondary | ICD-10-CM | POA: Diagnosis not present

## 2019-12-09 DIAGNOSIS — E119 Type 2 diabetes mellitus without complications: Secondary | ICD-10-CM | POA: Diagnosis not present

## 2019-12-09 DIAGNOSIS — Z794 Long term (current) use of insulin: Secondary | ICD-10-CM | POA: Diagnosis not present

## 2019-12-09 DIAGNOSIS — G4733 Obstructive sleep apnea (adult) (pediatric): Secondary | ICD-10-CM | POA: Diagnosis not present

## 2019-12-20 DIAGNOSIS — G4733 Obstructive sleep apnea (adult) (pediatric): Secondary | ICD-10-CM | POA: Diagnosis not present

## 2019-12-22 DIAGNOSIS — Z950 Presence of cardiac pacemaker: Secondary | ICD-10-CM | POA: Diagnosis not present

## 2019-12-22 DIAGNOSIS — Z45018 Encounter for adjustment and management of other part of cardiac pacemaker: Secondary | ICD-10-CM | POA: Diagnosis not present

## 2019-12-22 DIAGNOSIS — I495 Sick sinus syndrome: Secondary | ICD-10-CM | POA: Diagnosis not present

## 2019-12-23 DIAGNOSIS — E559 Vitamin D deficiency, unspecified: Secondary | ICD-10-CM | POA: Diagnosis not present

## 2019-12-23 DIAGNOSIS — E039 Hypothyroidism, unspecified: Secondary | ICD-10-CM | POA: Diagnosis not present

## 2019-12-23 DIAGNOSIS — E781 Pure hyperglyceridemia: Secondary | ICD-10-CM | POA: Diagnosis not present

## 2019-12-23 DIAGNOSIS — E1122 Type 2 diabetes mellitus with diabetic chronic kidney disease: Secondary | ICD-10-CM | POA: Diagnosis not present

## 2019-12-25 ENCOUNTER — Telehealth: Payer: Self-pay | Admitting: Cardiology

## 2019-12-27 NOTE — Telephone Encounter (Signed)
Called and spoke with patient regarding her remote pacemaker check results.

## 2019-12-30 DIAGNOSIS — E039 Hypothyroidism, unspecified: Secondary | ICD-10-CM | POA: Diagnosis not present

## 2019-12-30 DIAGNOSIS — E1122 Type 2 diabetes mellitus with diabetic chronic kidney disease: Secondary | ICD-10-CM | POA: Diagnosis not present

## 2019-12-30 DIAGNOSIS — I129 Hypertensive chronic kidney disease with stage 1 through stage 4 chronic kidney disease, or unspecified chronic kidney disease: Secondary | ICD-10-CM | POA: Diagnosis not present

## 2019-12-30 DIAGNOSIS — E785 Hyperlipidemia, unspecified: Secondary | ICD-10-CM | POA: Diagnosis not present

## 2019-12-30 DIAGNOSIS — G609 Hereditary and idiopathic neuropathy, unspecified: Secondary | ICD-10-CM | POA: Diagnosis not present

## 2020-01-02 DIAGNOSIS — E1165 Type 2 diabetes mellitus with hyperglycemia: Secondary | ICD-10-CM | POA: Diagnosis not present

## 2020-01-02 DIAGNOSIS — R069 Unspecified abnormalities of breathing: Secondary | ICD-10-CM | POA: Diagnosis not present

## 2020-01-02 DIAGNOSIS — I5033 Acute on chronic diastolic (congestive) heart failure: Secondary | ICD-10-CM | POA: Diagnosis not present

## 2020-01-14 DIAGNOSIS — C519 Malignant neoplasm of vulva, unspecified: Secondary | ICD-10-CM

## 2020-01-14 HISTORY — DX: Malignant neoplasm of vulva, unspecified: C51.9

## 2020-01-20 DIAGNOSIS — G4733 Obstructive sleep apnea (adult) (pediatric): Secondary | ICD-10-CM | POA: Diagnosis not present

## 2020-01-22 ENCOUNTER — Other Ambulatory Visit: Payer: Self-pay | Admitting: Cardiology

## 2020-02-02 DIAGNOSIS — I5033 Acute on chronic diastolic (congestive) heart failure: Secondary | ICD-10-CM | POA: Diagnosis not present

## 2020-02-02 DIAGNOSIS — R069 Unspecified abnormalities of breathing: Secondary | ICD-10-CM | POA: Diagnosis not present

## 2020-02-06 ENCOUNTER — Encounter: Payer: Self-pay | Admitting: Cardiovascular Disease

## 2020-02-06 ENCOUNTER — Ambulatory Visit: Payer: Medicare HMO | Admitting: Cardiovascular Disease

## 2020-02-06 ENCOUNTER — Other Ambulatory Visit: Payer: Self-pay

## 2020-02-06 VITALS — BP 110/62 | HR 60 | Ht 62.0 in | Wt 199.0 lb

## 2020-02-06 DIAGNOSIS — E668 Other obesity: Secondary | ICD-10-CM | POA: Diagnosis not present

## 2020-02-06 DIAGNOSIS — G4719 Other hypersomnia: Secondary | ICD-10-CM | POA: Diagnosis not present

## 2020-02-06 DIAGNOSIS — I10 Essential (primary) hypertension: Secondary | ICD-10-CM | POA: Diagnosis not present

## 2020-02-06 DIAGNOSIS — Z950 Presence of cardiac pacemaker: Secondary | ICD-10-CM | POA: Diagnosis not present

## 2020-02-06 DIAGNOSIS — I480 Paroxysmal atrial fibrillation: Secondary | ICD-10-CM | POA: Diagnosis not present

## 2020-02-06 DIAGNOSIS — G4731 Primary central sleep apnea: Secondary | ICD-10-CM | POA: Diagnosis not present

## 2020-02-06 NOTE — Progress Notes (Signed)
Patient ID: [name], female   DOB: 07-Dec-1945, 75 y.o.   MRN: [id number]    Primary MD: Dr. Deland Pretty  Primary cardiologist: Dr. Einar Gip  HPI: [name] is a 75 y.o. female who was referred by Dr.Pharr for a sleep evaluation.  I initially saw her in July 2019 with last follow-up in December 2020.  She presents for a 61-monthfollow-up evaluation.    [name] was diagnosed with sleep apnea in February 2013 when she was referred for sleep study for evaluation of nonrestorative sleep and loud snoring.  At that time she had witnessed apnea, was gasping for breath and her symptoms had persisted for over 2 years.  She was found to have moderate obstructive sleep apnea.  Overall, with an HIV 18.5 per hour.  However, with rim sleep.  Events were severe, with an HI 55.7 per hour.  She had significant oxygen desaturation to 70% with non-REM sleep and 64% with rim sleep.  She had moderate snoring.  She underwent a CPAP titration and with CPAP therapy due to development of central events, and the need for higher pressures.  She ultimately required BiPAP therapy.  [facility]  She has not been able to get any supplies.  She has noticed a significant "whistle "her mask  I saw her for sleep evaluation in April 2016 and at that time was able to obtain a download of her unit from 03/28/2014 through 04/26/2014.  She was meeting Medicare compliance with use of 100%.  She is averaging 7 hours and 22 minutes per day and is currently set at an IPAP pressure of 19 and an EPAP pressure of 15.  Her AHI is very good at 4.0.  She set at a ramp time of 30 minutes.  She has a nasal mask, F&P Zest nasal mask.  She typically goes to bed at a 11:30 p.m. and wakes up at 6 AM for a sleep duration of only 6-1/2 hours.  She continues to have daytime sleepiness and an Epworth Sleepiness Scale score was elevated and endorsed at  15.  When I saw her in July 2019 after not having seen her since 2016  her BiPAP machine quit and was non functional for over 2 years.  As result, she has not been sleeping well.  She went to bed at midnight and woke up between 7 and 7:30 in the morning.  She usually has 2-3 episodes of nocturia.  She does snore.  She had recently seen Dr. WDeland Prettywho recommended reassessment of her sleep apnea she presents for evaluation.  She underwent a reevaluation on August 11, 2017.  AHI was 26.6, AHI during REM sleep was significantly elevated at 62.4/h and there was significant oxygen desaturation to a nadir of 73%.  Ultimately required BiPAP titration and underwent a BiPAP titration study on December 18, 2017 and 14/10 centimeters water pressure was recommended.  Since initiating BiPAP therapy, she has noticed significant improvement and feels much better.  She is sleeping well.  She has more energy.  Her set up date was October 19, 2017.  [employer] is her DME company.  A download was obtained from October 25, 2017 through November 23, 2017 which showed 100% compliance; sleeping 8 hours and 43 minutes.  AHI was excellent at 0.5 on her current settings.  [facility] which showed an EF of 56%.  She has moderate concentric LVH.  There is severe left atrial enlargement, moderate mitral regurgitation, moderate tricuspid regurgitation and estimated pulmonary pressure was elevated at 40 mm.  I last saw her in December 2020 at which time she was continuing to use BiPAP.  She has a dream station auto BiPAP unit with device settings at an EPAP of 10 and IPAP of 14.  Her ramp time has been set at 20 with a ramp start pressure of 4.  A new download was obtained in the from November 21, 2018 through December 20, 2018.  Compliance was excellent with 100% use; average use was 6 hours and 3 minutes per night.  A new Epworth Sleepiness Scale score was  calculated in the office and this endorsed at 16 consistent with residual daytime sleepiness.    Over the past year, she states that she has continued to use her BiPAP consistently.  A download was obtained in the office today from January 07, 2020 through February 05, 2020.  She is 100% compliant with average use of 5 hours and 38 minutes.  Her BiPAP settings are 14/10 centimeters of water and AHI is excellent at 1.1.  She believes she is sleeping well.  However her sleep duration is suboptimal because she feels that she often is listening all the time for her husband who has issues.  She typically goes to bed between 1130 and 1 AM and wakes up about 5 AM.  She is unaware of breakthrough snoring.  However, I calculated an Epworth Sleepiness Scale score today and this endorsed at 14 consistent with residual daytime sleepiness.  She had received information concerning the dream station [employer] recall but apparently she has never followed up with the company and provided them information.  She continues to follow with Dr. Einar Gip for cardiology care.  She presents for follow-up sleep evaluation.  Past Medical History:  Diagnosis Date  . Arthritis   . Diabetes mellitus without complication ([place])   . Encounter for care of pacemaker 09/03/2018  . History of chickenpox   . Hx of psoriatic arthritis   . Hyperlipemia   . Hypertension   . Hypertension 05/03/2018  . Mitral valve disorders(424.0)   . Obstructive sleep apnea   . Pacemaker   . Paroxysmal atrial fibrillation ([place]) 10/11/2016  . Peripheral neuropathy   . Renal disorder Kidney disease stage2  . Thyroid disease hypothyroid  . Urine incontinence   . Vitamin D deficiency     Past Surgical History:  Procedure Laterality Date  . CARDIAC CATHETERIZATION N/A 04/29/2015   Procedure: Temporary Pacemaker;  Surgeon: Adrian Prows, MD;  Location: [place] [facility];  Service: Cardiovascular;  Laterality: N/A;  . CARDIOVERSION N/A 09/07/2018   Procedure:  CARDIOVERSION;  Surgeon: Adrian Prows, MD;  Location: [place];  Service: Cardiovascular;  Laterality: N/A;  . CARDIOVERSION    . EP IMPLANTABLE DEVICE N/A 04/30/2015   Procedure: Pacemaker Implant;  Surgeon: Evans Lance, MD;  Location: [place] [facility];  Service: Cardiovascular;  Laterality: N/A;  . INTRAVASCULAR PRESSURE WIRE/FFR STUDY N/A 08/31/2017   Procedure: INTRAVASCULAR PRESSURE WIRE/FFR STUDY;  Surgeon: Nigel Mormon, MD;  Location: [place] [facility];  Service: Cardiovascular;  Laterality: N/A;  . LEFT AND RIGHT HEART CATHETERIZATION WITH CORONARY ANGIOGRAM N/A 09/30/2011   Procedure: LEFT AND RIGHT HEART CATHETERIZATION WITH CORONARY ANGIOGRAM;  Surgeon: Laverda Page, MD;  Location: [facility] C[facility];  Service: Cardiovascular;  Laterality: N/A;  . RIGHT/LEFT HEART CATH AND CORONARY ANGIOGRAPHY N/A 08/31/2017   Procedure: RIGHT/LEFT HEART CATH AND CORONARY ANGIOGRAPHY;  Surgeon: Patwardhan, Manish J, MD;  Location: [facility];  Service: Cardiovascular;  Laterality: N/A;  . TOOTH EXTRACTION  10/07/2018  . YAG LASER APPLICATION Right 03/07/2014   Procedure: YAG LASER APPLICATION;  Surgeon: Mark T. Shapiro, MD;  Location: [facility];  Service: Ophthalmology;  Laterality: Right;  pt knows to arrive at 11:15  . YAG LASER APPLICATION Left 03/21/2014   Procedure: YAG LASER APPLICATION;  Surgeon: Mark Shapiro, MD;  Location: [facility];  Service: Ophthalmology;  Laterality: Left;    Allergies  Allergen Reactions  . Hydrocodone Other (See Comments)    Lethargic   . Invokana [Canagliflozin] Palpitations and Other (See Comments)    Made heart race  . Oxycodone Other (See Comments)    Lethargic     Current Outpatient Medications  Medication Sig Dispense Refill  . Cholecalciferol (VITAMIN D-3) 1000 units CAPS Take 2,000 Units by mouth daily.    . ELIQUIS 5 MG TABS tablet Take 1 tablet by mouth twice daily 180 tablet 2  . EUTHYROX 88 MCG tablet Take 88 mcg by mouth daily.    .  fenofibrate 160 MG tablet Take 160 mg by mouth daily with supper.     . insulin aspart protamine- aspart (NOVOLOG MIX 70/30) (70-30) 100 UNIT/ML injection Inject 10-70 Units into the skin See admin instructions. Inject 50 units into the skin with breakfast, 35 units with supper (may adjust based on blood sugar readings)    . isosorbide mononitrate (IMDUR) 60 MG 24 hr tablet Take 1 tablet (60 mg total) by mouth daily. 90 tablet 3  . levothyroxine (SYNTHROID, LEVOTHROID) 50 MCG tablet Take 50 mcg by mouth daily before breakfast.    . metoprolol tartrate (LOPRESSOR) 50 MG tablet Take 1 tablet (50 mg total) by mouth 2 (two) times daily. 180 tablet 3  . nitroGLYCERIN (NITROSTAT) 0.4 MG SL tablet Place 1 tablet (0.4 mg total) under the tongue every 5 (five) minutes as needed for chest pain. 25 tablet 1  . olmesartan-hydrochlorothiazide (BENICAR HCT) 40-25 MG tablet Take 1 tablet by mouth once daily 90 tablet 0  . Omega-3 Fatty Acids (FISH OIL OMEGA-3 PO) Take by mouth daily.    . spironolactone (ALDACTONE) 25 MG tablet Take 1 tablet by mouth once daily 90 tablet 0  . SURE COMFORT INSULIN SYRINGE 31G X 5/16" 0.5 ML MISC     . triamcinolone ointment (KENALOG) 0.5 % APPLY  OINTMENT TOPICALLY TO AFFECTED AREA TWICE DAILY 30 g 1  . vitamin B-12 (CYANOCOBALAMIN) 1000 MCG tablet Take 2,000 mcg by mouth daily.    . nystatin cream (MYCOSTATIN) APPLY  CREAM TOPICALLY TWICE DAILY AS NEEDED (FOR  IRRITATION  OF  AFFECTED  AREA) 30 g 0   No current facility-administered medications for this visit.    Social History   Socioeconomic History  . Marital status: Married    Spouse name: Not on file  . Number of children: 3  . Years of education: Not on file  . Highest education level: Not on file  Occupational History  . Not on file  Tobacco Use  . Smoking status: Never Smoker  . Smokeless tobacco: Never Used  Vaping Use  . Vaping Use: Never used  Substance and Sexual Activity  . Alcohol use: No     Alcohol/week: 0.0 standard drinks  . Drug use: No  . Sexual   activity: Not Currently  Other Topics Concern  . Not on file  Social History Narrative  . Not on file   Social Determinants of Health   Financial Resource Strain: Not on file  Food Insecurity: Not on file  Transportation Needs: Not on file  Physical Activity: Not on file  Stress: Not on file  Social Connections: Not on file  Intimate Partner Violence: Not on file   Social history is notable: She is married, has 3 children 5 grandchildren.  Family History  Problem Relation Age of Onset  . Arthritis Mother   . Diabetes Mother   . Heart disease Mother   . Hyperlipidemia Mother   . Hypertension Mother   . Arthritis Father   . Asthma Father   . Heart attack Father   . Hyperlipidemia Father   . Hypertension Father   . Stroke Father   . Arthritis Sister   . Diabetes Sister   . Hypertension Sister   . Arthritis Brother   . Heart attack Brother   . Heart disease Brother   . Hyperlipidemia Brother   . Hypertension Brother   . Alcohol abuse Brother   . Arthritis Brother   . Diabetes Brother   . Early death Brother   . Heart disease Brother   . Hyperlipidemia Brother   . Hypertension Brother   . Arthritis Sister   . Cancer Sister   . Diabetes Sister   . Hyperlipidemia Sister   . Hypertension Sister   . Stroke Sister   . Cancer Sister   . Hyperlipidemia Sister   . Hypertension Sister   . Hypertension Daughter    Family history is notable that her mother died at age 26 and had diabetes and heart disease.  Father died of stroke at age 52.  There are total of 6 siblings, 3 brothers and 3 sisters of which 2 brothers and 2 sisters are deceased.  One sister who is deceased, had sleep apnea but was untreated.  One brother at heart disease is deceased and had sleep apnea and was noncompliant with his CPAP use.  ROS General: Negative; No fevers, chills, or night sweats.  Positive for moderate obesity HEENT: Negative;  No changes in vision or hearing, sinus congestion, difficulty swallowing Pulmonary: Negative; No cough, wheezing, shortness of breath, hemoptysis Cardiovascular: Positive for diastolic heart failure GI: Negative; No nausea, vomiting, diarrhea, or abdominal pain GU: Negative; No dysuria, hematuria, or difficulty voiding Musculoskeletal: Negative; no myalgias, joint pain, or weakness Hematologic: Negative; no easy bruising, bleeding Endocrine: Positive for diabetes mellitus Neuro: Negative; no changes in balance, headaches Skin: Negative; No rashes or skin lesions Psychiatric: Negative; No behavioral problems, depression Sleep: Positive for OSA previously with  central events with CPAP therapy; now on BiPAP.   Physical Exam BP 110/62 (BP Location: Right Arm, Patient Position: Sitting)   Pulse 60   Ht 5' 2" (1.575 m)   Wt 199 lb (90.3 kg)   SpO2 95%   BMI 36.40 kg/m    Repeat blood pressure was 116/64  Wt Readings from Last 3 Encounters:  02/06/20 199 lb (90.3 kg)  11/10/19 199 lb (90.3 kg)  08/26/19 200 lb (90.7 kg)   General: Alert, oriented, no distress.  Skin: normal turgor, no rashes, warm and dry HEENT: Normocephalic, atraumatic. Pupils equal round and reactive to light; sclera anicteric; extraocular muscles intact;  Nose without nasal septal hypertrophy Mouth/Parynx benign; Mallinpatti scale 3 Neck: No JVD, no carotid bruits; normal carotid upstroke Lungs: clear  to ausculatation and percussion; no wheezing or rales Chest wall: without tenderness to palpitation Heart: PMI not displaced, RRR, s1 s2 normal, 1/6 systolic murmur left sternal border and apex, no diastolic murmur, no rubs, gallops, thrills, or heaves Abdomen: soft, nontender; no hepatosplenomehaly, BS+; abdominal aorta nontender and not dilated by palpation. Back: no CVA tenderness Pulses 2+ Musculoskeletal: full range of motion, normal strength, no joint deformities Extremities: no clubbing cyanosis or edema,  Homan's sign negative  Neurologic: grossly nonfocal; Cranial nerves grossly wnl Psychologic: Normal mood and affect  I personally reviewed the ECG from November 10, 2019 which showed atrially paced rhythm with first-degree AV block and appear normal at 236 ms.  Ventricular rate 60.  QS complex V1 to V3.    December 21, 2018 ECG (independently read by me): Atrially paced at 78 bpm with prolonged AV conduction with a PR interval of 296 ms.  R wave progression anteriorly.  ECG from Jun 07, 2017 was independently reviewed by me which reveals atrially paced rhythm.  Poor anterior R wave progression.  LABS:  BMP Latest Ref Rng & Units 08/26/2019 09/07/2018 08/31/2018  Glucose 70 - 99 mg/dL 176(H) 149(H) 146(H)  BUN 8 - 23 mg/dL 30(H) 30(H) 24  Creatinine 0.44 - 1.00 mg/dL 1.34(H) 1.50(H) 1.39(H)  BUN/Creat Ratio 12 - 28 - - 17  Sodium 135 - 145 mmol/L 136 139 139  Potassium 3.5 - 5.1 mmol/L 4.7 4.9 5.2  Chloride 98 - 111 mmol/L 100 99 100  CO2 22 - 32 mmol/L 27 - 25  Calcium 8.9 - 10.3 mg/dL 10.3 - 10.5(H)     Hepatic Function Latest Ref Rng & Units 06/30/2018 08/28/2017 06/08/2017  Total Protein 6.5 - 8.1 g/dL 7.6 6.8 7.0  Albumin 3.5 - 5.0 g/dL 3.5 3.3(L) 3.4(L)  AST 15 - 41 U/L 25 21 21  ALT 0 - 44 U/L 23 23 30  Alk Phosphatase 38 - 126 U/L 29(L) 31(L) 24(L)  Total Bilirubin 0.3 - 1.2 mg/dL 0.3 0.9 0.7     CBC Latest Ref Rng & Units 08/26/2019 09/07/2018 06/30/2018  WBC 4.0 - 10.5 K/uL 7.1 - 6.3  Hemoglobin 12.0 - 15.0 g/dL 13.6 15.3(H) 13.7  Hematocrit 36.0 - 46.0 % 43.0 45.0 43.6  Platelets 150 - 400 K/uL 155 - 152     Lipid Panel     Component Value Date/Time   CHOL 168 08/29/2017 0328   TRIG 544 (H) 08/29/2017 0328   HDL 19 (L) 08/29/2017 0328   CHOLHDL 8.8 08/29/2017 0328   VLDL UNABLE TO CALCULATE IF TRIGLYCERIDE OVER 400 mg/dL 08/29/2017 0328   LDLCALC UNABLE TO CALCULATE IF TRIGLYCERIDE OVER 400 mg/dL 08/29/2017 0328     RADIOLOGY: No results  found.  IMPRESSION:  1. Complex sleep apnea syndrome: on BiPAP   2. Excessive daytime sleepiness   3. Pacemaker  Medtronic  Medtronic Adapta Dr [id number], leads only MRI  in situ 04/30/2015   4. Paroxysmal atrial fibrillation ([facility])   5. Essential hypertension   6. Moderate obesity    ASSESSMENT AND PLAN: [name] is a 75 year-old female who has documented to have complex sleep apnea with both obstructive and central events and has been on BiPAP therapy since 2013.  She has significant cardiovascular comorbidities and has been followed by Dr. Ganji for her cardiac issues.  After not having seen her in several years, I saw her in July 2019 at which time her BiPAP machine had been nonfunctional over several years.    On her follow-up sleep evaluation she was found to have moderate overall sleep apnea which was very severe during REM sleep with significant oxygen desaturation to 73%.  She has been consistently compliant but has had issues with inadequate sleep duration.  When I last saw her in December 2020 she again was compliant and AHI was excellent at 1.2.  At that time I reduced her ramp time and increased her ramp start up to 6.  I discussed optimal sleep duration at 8 hours.  She continues to be on amiodarone 20 mg daily and Eliquis in addition to her spironolactone olmesartan HCT metoprolol fenofibrate and hyperlipidemia.  Since her last evaluation, she continues to be 100% compliant with BiPAP use but again sleep duration is suboptimal only 5 hours and 38 minutes.  Her AHI is excellent at 1.1 with her BiPAP set pressure at 14/10.  Her Epworth Sleepiness Scale score calculated today endorsed at 14 is consistent with excessive daytime sleepiness.  I suspect this is predominantly contributed by her inadequate sleep duration.  We discussed again the importance of optimal sleep duration at 7 to 8 hours.  I also discussed with her the Respironics recall.  We discussed that it is important for her to  get back to [employer] so that they can put her on a list ultimately for a new unit.  She has never used the ozone cleaners which may have been implicated in causing the Styrofoam breakdown.  We also discussed the inlet filter which can be inserted between her machine and tubing.  She is not having any chest pain.  Her blood pressure today is stable on isosorbide 60 mg, metoprolol, spironolactone and olmesartan HCT.  She continues to be on amiodarone and Eliquis and is not having any recurrent AF.  She is atrially paced on ECG.  She continues to be on levothyroxine for hypothyroidism.  She will follow up with Dr. Einar Gip for cardiology care.  I will see her in 1 year for follow-up sleep evaluation.  Troy Sine, MD, [facility]  02/12/2020 11:58 AM

## 2020-02-06 NOTE — Patient Instructions (Signed)

## 2020-02-09 ENCOUNTER — Other Ambulatory Visit: Payer: Self-pay | Admitting: Adult Health

## 2020-02-12 ENCOUNTER — Encounter: Payer: Self-pay | Admitting: Cardiovascular Disease

## 2020-02-19 ENCOUNTER — Other Ambulatory Visit: Payer: Self-pay | Admitting: Cardiology

## 2020-02-20 DIAGNOSIS — G4733 Obstructive sleep apnea (adult) (pediatric): Secondary | ICD-10-CM | POA: Diagnosis not present

## 2020-03-04 DIAGNOSIS — R069 Unspecified abnormalities of breathing: Secondary | ICD-10-CM | POA: Diagnosis not present

## 2020-03-04 DIAGNOSIS — I5033 Acute on chronic diastolic (congestive) heart failure: Secondary | ICD-10-CM | POA: Diagnosis not present

## 2020-03-17 DIAGNOSIS — I509 Heart failure, unspecified: Secondary | ICD-10-CM | POA: Diagnosis not present

## 2020-03-17 DIAGNOSIS — I25119 Atherosclerotic heart disease of native coronary artery with unspecified angina pectoris: Secondary | ICD-10-CM | POA: Diagnosis not present

## 2020-03-17 DIAGNOSIS — I110 Hypertensive heart disease with heart failure: Secondary | ICD-10-CM | POA: Diagnosis not present

## 2020-03-17 DIAGNOSIS — I4891 Unspecified atrial fibrillation: Secondary | ICD-10-CM | POA: Diagnosis not present

## 2020-03-17 DIAGNOSIS — I495 Sick sinus syndrome: Secondary | ICD-10-CM | POA: Diagnosis not present

## 2020-03-17 DIAGNOSIS — D6869 Other thrombophilia: Secondary | ICD-10-CM | POA: Diagnosis not present

## 2020-03-17 DIAGNOSIS — I4892 Unspecified atrial flutter: Secondary | ICD-10-CM | POA: Diagnosis not present

## 2020-03-17 DIAGNOSIS — G35 Multiple sclerosis: Secondary | ICD-10-CM | POA: Diagnosis not present

## 2020-03-17 DIAGNOSIS — L4050 Arthropathic psoriasis, unspecified: Secondary | ICD-10-CM | POA: Diagnosis not present

## 2020-03-19 DIAGNOSIS — G4733 Obstructive sleep apnea (adult) (pediatric): Secondary | ICD-10-CM | POA: Diagnosis not present

## 2020-03-22 DIAGNOSIS — E119 Type 2 diabetes mellitus without complications: Secondary | ICD-10-CM | POA: Diagnosis not present

## 2020-03-22 DIAGNOSIS — G4733 Obstructive sleep apnea (adult) (pediatric): Secondary | ICD-10-CM | POA: Diagnosis not present

## 2020-03-22 DIAGNOSIS — Z794 Long term (current) use of insulin: Secondary | ICD-10-CM | POA: Diagnosis not present

## 2020-04-01 DIAGNOSIS — E1165 Type 2 diabetes mellitus with hyperglycemia: Secondary | ICD-10-CM | POA: Diagnosis not present

## 2020-04-01 DIAGNOSIS — I5033 Acute on chronic diastolic (congestive) heart failure: Secondary | ICD-10-CM | POA: Diagnosis not present

## 2020-04-01 DIAGNOSIS — R069 Unspecified abnormalities of breathing: Secondary | ICD-10-CM | POA: Diagnosis not present

## 2020-04-17 ENCOUNTER — Other Ambulatory Visit: Payer: Self-pay | Admitting: Cardiology

## 2020-04-19 DIAGNOSIS — G4733 Obstructive sleep apnea (adult) (pediatric): Secondary | ICD-10-CM | POA: Diagnosis not present

## 2020-04-22 ENCOUNTER — Other Ambulatory Visit: Payer: Self-pay | Admitting: Cardiology

## 2020-04-30 ENCOUNTER — Ambulatory Visit: Payer: Medicare HMO | Admitting: Cardiology

## 2020-05-02 DIAGNOSIS — R069 Unspecified abnormalities of breathing: Secondary | ICD-10-CM | POA: Diagnosis not present

## 2020-05-02 DIAGNOSIS — I5033 Acute on chronic diastolic (congestive) heart failure: Secondary | ICD-10-CM | POA: Diagnosis not present

## 2020-05-18 ENCOUNTER — Encounter: Payer: Self-pay | Admitting: Cardiology

## 2020-05-18 ENCOUNTER — Other Ambulatory Visit: Payer: Self-pay

## 2020-05-18 ENCOUNTER — Ambulatory Visit: Payer: Medicare HMO | Admitting: Cardiology

## 2020-05-18 VITALS — BP 146/64 | HR 61 | Temp 97.9°F | Resp 16 | Ht 62.0 in | Wt 194.8 lb

## 2020-05-18 DIAGNOSIS — I10 Essential (primary) hypertension: Secondary | ICD-10-CM | POA: Diagnosis not present

## 2020-05-18 DIAGNOSIS — E7800 Pure hypercholesterolemia, unspecified: Secondary | ICD-10-CM

## 2020-05-18 DIAGNOSIS — I5032 Chronic diastolic (congestive) heart failure: Secondary | ICD-10-CM | POA: Diagnosis not present

## 2020-05-18 DIAGNOSIS — Z950 Presence of cardiac pacemaker: Secondary | ICD-10-CM

## 2020-05-18 DIAGNOSIS — I480 Paroxysmal atrial fibrillation: Secondary | ICD-10-CM | POA: Diagnosis not present

## 2020-05-18 DIAGNOSIS — I2510 Atherosclerotic heart disease of native coronary artery without angina pectoris: Secondary | ICD-10-CM | POA: Diagnosis not present

## 2020-05-18 NOTE — Progress Notes (Signed)
Primary Physician/Referring:  Deland Pretty, MD  Patient ID: [name], female    DOB: [birth year], 75 y.o.   MRN: [id number]  No chief complaint on file.  HPI:    LUDEAN DUHART  is a 75 y.o. female  with hypertension, type 2 diabetes mellitus with stage 3 CKD, chronic diastolic heart failure, mild hyperlipidemia on Tricor, nonobstructive CAD (Cath 2019), sick sinus syndrome status post dual-chamber pacemaker 2017, OSA on BiPAP, paroxysmal Afib and atrial flutter/atrial tachycardia.  Her last cardioversion was 09/07/2018 for atrial flutter.  Patient presents for 14-monthfollow-up of CHF and hypertension.  At last visit increased isosorbide mononitrate to 60 mg daily.  Patient's husband recently passed away about 3 weeks ago, patient is tearful on discussing.  She had previously been his caretaker for about 3 years due to Alzheimer's.  Patient does admit to poor diet compliance, with high salt intake.  Patient is otherwise feeling well, without specific complaints.  Denies chest pain, palpitations, dyspnea, syncope, near syncope, dizziness. Denies orthopnea, PND, leg swelling.   Notably patient is very hesitant to make changes to her medications today as she now feels she can make significant diet and lifestyle no that she is no longer taking care of her husband.   Past Medical History:  Diagnosis Date  . Arthritis   . Diabetes mellitus without complication [place]   . Encounter for care of pacemaker 09/03/2018  . History of chickenpox   . Hx of psoriatic arthritis   . Hyperlipemia   . Hypertension   . Hypertension 05/03/2018  . Mitral valve disorders(424.0)   . Obstructive sleep apnea   . Pacemaker   . [place] 10/11/2016  . Peripheral neuropathy   . Renal disorder Kidney disease stage2  . Thyroid disease hypothyroid  . Urine incontinence   . Vitamin D deficiency    Past Surgical History:  Procedure Laterality Date  . CARDIAC CATHETERIZATION N/A  04/29/2015   Procedure: Temporary Pacemaker;  Surgeon: JAdrian Prows MD;  [facility];  Service: Cardiovascular;  Laterality: N/A;  . CARDIOVERSION N/A 09/07/2018   Procedure: CARDIOVERSION;  Surgeon: GAdrian Prows MD;  [place] [facility]  Service: Cardiovascular;  Laterality: N/A;  . CARDIOVERSION    . EP IMPLANTABLE DEVICE N/A 04/30/2015   Procedure: Pacemaker Implant;  Surgeon: GEvans Lance MD;  [facility];  Service: Cardiovascular;  Laterality: N/A;  . INTRAVASCULAR PRESSURE WIRE/FFR STUDY N/A 08/31/2017   Procedure: INTRAVASCULAR PRESSURE WIRE/FFR STUDY;  Surgeon: PNigel Mormon MD;  [facility];  Service: Cardiovascular;  Laterality: N/A;  . LEFT AND RIGHT HEART CATHETERIZATION WITH CORONARY ANGIOGRAM N/A 09/30/2011   Procedure: LEFT AND RIGHT HEART CATHETERIZATION WITH CORONARY ANGIOGRAM;  Surgeon: JLaverda Page MD;  [facility];  Service: Cardiovascular;  Laterality: N/A;  . RIGHT/LEFT HEART CATH AND CORONARY ANGIOGRAPHY N/A 08/31/2017   Procedure: RIGHT/LEFT HEART CATH AND CORONARY ANGIOGRAPHY;  Surgeon: PNigel Mormon MD;  [facility];  Service: Cardiovascular;  Laterality: N/A;  . TOOTH EXTRACTION  10/07/2018  . YAG LASER APPLICATION Right 24/40/1027  Procedure: YAG LASER APPLICATION;  Surgeon: MElta GuadeloupeT. SGershon Crane MD;  Location: [facility];  Service: Ophthalmology;  Laterality: Right;  pt knows to arrive at 11:15  . YAG LASER APPLICATION Left 32/05/3662  Procedure: YAG LASER APPLICATION;  Surgeon: MRutherford Guys MD;  Location: [facility];  Service: Ophthalmology;  Laterality: Left;   Family  History  Problem Relation Age of Onset  . Arthritis Mother   . Diabetes Mother   . Heart disease Mother   . Hyperlipidemia Mother   . Hypertension Mother   . Arthritis Father   . Asthma Father   . Heart attack Father   . Hyperlipidemia Father   . Hypertension Father   . Stroke Father   . Arthritis Sister    . Diabetes Sister   . Hypertension Sister   . Arthritis Brother   . Heart attack Brother   . Heart disease Brother   . Hyperlipidemia Brother   . Hypertension Brother   . Alcohol abuse Brother   . Arthritis Brother   . Diabetes Brother   . Early death Brother   . Heart disease Brother   . Hyperlipidemia Brother   . Hypertension Brother   . Arthritis Sister   . Cancer Sister   . Diabetes Sister   . Hyperlipidemia Sister   . Hypertension Sister   . Stroke Sister   . Cancer Sister   . Hyperlipidemia Sister   . Hypertension Sister   . Hypertension Daughter     Social History   Tobacco Use  . Smoking status: Never Smoker  . Smokeless tobacco: Never Used  Substance Use Topics  . Alcohol use: No    Alcohol/week: 0.0 standard drinks   Marital Status: Married  ROS  Review of Systems  Constitutional: Positive for malaise/fatigue.  Cardiovascular: Positive for dyspnea on exertion (chronic). Negative for chest pain, claudication, leg swelling, near-syncope, orthopnea, palpitations, paroxysmal nocturnal dyspnea and syncope.  Respiratory: Negative for shortness of breath.   Hematologic/Lymphatic: Does not bruise/bleed easily.  Gastrointestinal: Negative for melena.  Neurological: Negative for dizziness and weakness.  Psychiatric/Behavioral: Positive for depression.   Objective  Blood pressure (!) 146/64, pulse 61, temperature 97.9 F (36.6 C), temperature source Temporal, resp. rate 16, height _0  (1.575 m), weight 194 lb 12.8 oz (88.4 kg), SpO2 94 %.  Vitals with BMI 05/18/2020 02/06/2020 11/10/2019  Height _1  _2  _3   Weight 194 lbs 13 oz 199 lbs 199 lbs  BMI 35.62 27.78 24.23  Systolic 536 144 315  Diastolic 64 62 50  Pulse 61 60 60     Physical Exam Constitutional:      Appearance: She is well-developed.  Neck:     Vascular: No JVD.  Cardiovascular:     Rate and Rhythm: Normal rate and regular rhythm.     Pulses: Intact distal pulses.          Carotid  pulses are 2+ on the right side and 2+ on the left side with bruit.      Popliteal pulses are 1+ on the right side and 1+ on the left side.       Dorsalis pedis pulses are 1+ on the right side and 1+ on the left side.       Posterior tibial pulses are 1+ on the right side and 1+ on the left side.     Heart sounds: S1 normal and S2 normal. Murmur heard.   Midsystolic murmur is present with a grade of 2/6 at the upper right sternal border and apex. No gallop.   Pulmonary:     Effort: Pulmonary effort is normal. No accessory muscle usage or respiratory distress.     Breath sounds: Normal breath sounds.  Musculoskeletal:        General: Normal range of motion.     Right lower leg:  No edema.     Left lower leg: No edema.  Skin:    General: Skin is warm and dry.    Laboratory examination:   Recent Labs    08/26/19 1055  NA 136  K 4.7  CL 100  CO2 27  GLUCOSE 176*  BUN 30*  CREATININE 1.34*  CALCIUM 10.3  GFRNONAA 38*  GFRAA 44*   CrCl cannot be calculated (Patient's most recent lab result is older than the maximum 21 days allowed.).  CMP Latest Ref Rng & Units 08/26/2019 09/07/2018 08/31/2018  Glucose 70 - 99 mg/dL 176(H) 149(H) 146(H)  BUN 8 - 23 mg/dL 30(H) 30(H) 24  Creatinine 0.44 - 1.00 mg/dL 1.34(H) 1.50(H) 1.39(H)  Sodium 135 - 145 mmol/L 136 139 139  Potassium 3.5 - 5.1 mmol/L 4.7 4.9 5.2  Chloride 98 - 111 mmol/L 100 99 100  CO2 22 - 32 mmol/L 27 - 25  Calcium 8.9 - 10.3 mg/dL 10.3 - 10.5(H)  Total Protein 6.5 - 8.1 g/dL - - -  Total Bilirubin 0.3 - 1.2 mg/dL - - -  Alkaline Phos 38 - 126 U/L - - -  AST 15 - 41 U/L - - -  ALT 0 - 44 U/L - - -   CBC Latest Ref Rng & Units 08/26/2019 09/07/2018 06/30/2018  WBC 4.0 - 10.5 K/uL 7.1 - 6.3  Hemoglobin 12.0 - 15.0 g/dL 13.6 15.3(H) 13.7  Hematocrit 36.0 - 46.0 % 43.0 45.0 43.6  Platelets 150 - 400 K/uL 155 - 152   Lipid Panel     Component Value Date/Time   CHOL 168 08/29/2017 0328   TRIG 544 (H) 08/29/2017 0328    HDL 19 (L) 08/29/2017 0328   CHOLHDL 8.8 08/29/2017 0328   VLDL UNABLE TO CALCULATE IF TRIGLYCERIDE OVER 400 mg/dL 08/29/2017 0328   LDLCALC UNABLE TO CALCULATE IF TRIGLYCERIDE OVER 400 mg/dL 08/29/2017 0328   HEMOGLOBIN A1C Lab Results  Component Value Date   HGBA1C 7.3 (H) 09/30/2015   MPG 163 09/30/2015   TSH Recent Labs    08/26/19 1054  TSH 4.404    External labs:  10/25/2019: Sodium 142, glucose 135, BUN 24, GFR 42, potassium 5.5, AST 22, ALT 37, alk phos 25 A1c 6.9% Total cholesterol 177, triglycerides 240, LDL 100, HDL 29 TSH 3.61  Cholesterol, total 168.000 m 08/29/2017 HDL 17.000 mg 06/09/2019 LDL-C 100.000 ca 06/09/2019 Triglycerides 343.000 m 06/09/2019  A1C 7.200 % 06/24/2019 TSH 5.100 08/31/2019  Hemoglobin 13.600 g/d 08/26/2019 Platelets 155.000 K/ 08/26/2019  Creatinine, Serum 1.340 mg/ 08/26/2019 Potassium 4.700 mm 08/26/2019 Magnesium 1.700 MG/ 04/24/2015 ALT (SGPT) 21.000 IU/ 06/24/2019  05/21/2018: HbA1c 6.7. Lipid Panel: Chol 146, Trig 321, HDL 17, LDL 65, VLDL 64. ALK Ph 32.   08/28/17: TSH Normal  Medications and allergies   Allergies  Allergen Reactions  . Hydrocodone Other (See Comments)    Lethargic   . Invokana [Canagliflozin] Palpitations and Other (See Comments)    Made heart race  . Oxycodone Other (See Comments)    Lethargic      Current Outpatient Medications  Medication Instructions  . amiodarone (PACERONE) 100 mg, Oral, Daily  . conjugated estrogens (PREMARIN) vaginal cream See admin instructions  . ELIQUIS 5 MG TABS tablet Take 1 tablet by mouth twice daily  . Euthyrox 88 mcg, Oral, Daily  . fenofibrate 160 mg, Oral, Daily with supper  . insulin aspart protamine- aspart (NOVOLOG MIX 70/30) (70-30) 100 UNIT/ML injection 10-70 Units, Subcutaneous, See admin instructions, Inject  45 units into the skin with breakfast, 35 units with supper (may adjust based on blood sugar readings)  . isosorbide mononitrate (IMDUR) 60 mg, Oral,  Daily  . metoprolol tartrate (LOPRESSOR) 50 mg, Oral, 2 times daily  . nitroGLYCERIN (NITROSTAT) 0.4 mg, Sublingual, Every 5 min PRN  . nystatin cream (MYCOSTATIN) APPLY  CREAM TOPICALLY TWICE DAILY AS NEEDED (FOR  IRRITATION  OF  AFFECTED  AREA)  . olmesartan-hydrochlorothiazide (BENICAR HCT) 40-25 MG tablet Take 1 tablet by mouth once daily  . Omega-3 Fatty Acids (FISH OIL OMEGA-3 PO) Oral, Daily  . spironolactone (ALDACTONE) 25 MG tablet Take 1 tablet by mouth once daily  . SURE COMFORT INSULIN SYRINGE 31G X 5/16" 0.5 ML MISC No dose, route, or frequency recorded.  . torsemide (DEMADEX) 5 MG tablet 1 tablet, Oral, As needed  . triamcinolone ointment (KENALOG) 0.5 % APPLY  OINTMENT TOPICALLY TO AFFECTED AREA TWICE DAILY  . vitamin B-12 (CYANOCOBALAMIN) 2,000 mcg, Oral, Daily  . Vitamin D-3 2,000 Units, Oral, Daily   Radiology:   No results found.  Cardiac Studies:   Carotid Doppler01/29/2016: No evidence of hemodynamically significant stenosis in the bilateral carotid bifurcation vessels. There is mild evidence of homogeneous plaque in bilateral carotid arteries. No significant change from 10/13/2011.  Lexiscan myoview stress test 06/15/2017: 1. Lexiscan stress test was performed. Exercise capacity was not assessed. Stress symptoms included dizziness. Peak blood pressure 158/66 mmHg. Stress EKG is non diagnostic for ischemia as it is a pharmacologic stress. In addition, it demonstrated atrial pacing and incomplete LBBB. 2. The overall quality of the study is excellent. There is no evidence of abnormal lung activity. Stress and rest SPECT images demonstrate homogeneous tracer distribution throughout the myocardium. Gated SPECT imaging reveals normal myocardial thickening and wall motion. The left ventricular ejection fraction was normal (71%). 3. Low risk study.  Sleep Study 08/11/2017: Positive for Complex Sleep Apnea; On CPAP   R&LHC 08/31/2017: LCx: Nondominant. AV grove LCx  50-60% diffuse disease. RCA: Large dominant. Ostial PDA 50% stenosis, FFR 0.96. No change from 09/30/2011. RA: 5 mmHg RV: 45/4 mmHg. PA: 40/12 mmHg. Mean PA 29 mmHg PCWP: 16 mmHg LV: 130/3 mmHg, LVEDP 13 mmHg CO: 3.6 L/min. CI 1.9 L/min/m2  Direct current cardioversion 09/07/2018: Indication symptomatic A. Flutter. Procedure: Using 50 mg of IV Propofol and 20 IV Lidocaine (for reducing venous pain) for achieving deep sedation, synchronized direct current cardioversion performed. Patient was delivered with 120 Joules of electricity X 1 with success to A-Paced Rhythm. Patient tolerated the procedure well. No immediate complication noted.   Echocardiogram 11/03/2018: Left ventricle cavity is normal in size. Moderate concentric hypertrophy of the left ventricle. Normal LV systolic function with EF 56%. Normal global wall motion. Unable to evaluate diastolic function due to E/A fusion.  Left atrial cavity is severely dilated. Trace aortic regurgitation. Moderate (Grade III) mitral regurgitation. Mild mitral valve leaflet thickening. Moderate tricuspid regurgitation. Estimated pulmonary artery systolic pressure is 40 mmHg.  No significant change compared to previous study on 07/01/2017.   Pacemaker:   Scheduled Remote pacemaker check 12/12/2019:  No mode switches. No VHR episodes. Health trends do not demonstrate significant abnormality. Battery longevity is 9.5 years. RA pacing is 99.6 %, RV pacing is 7.7 %.  Scheduled In office pacemaker check 08/30/2018:  There were 6 AHR Episodes Since 08/08/2018, EGM recordings reveal maximum a rate between 180-201 bpm,, The longest His persistent since 08/30/2018,Atrial rate 180, ventricular rate 1 28 bpm, EGM's suggest atrial tachycardia/AFL. There was  0.4% truly due to atrial arrhythmia burden, pacemaker thresholds and impedance stable. Longevity 8.5, 12 years  EKG:   05/18/2020: Atrial paced rhythm with first-degree AV block at a rate of 60 bpm.  Left  axis, left anterior fascicular block.  Poor R wave progression, cannot exclude anteroseptal infarct old.  IVCD.  Compared to EKG 11/10/2019, no significant change.  11/10/2019: Atrially paced rhythm with first-degree AV block, ventricularly sensed.  Ventricular rate 60 bpm.  Left axis deviation, left intrafascicular block.  Anteroseptal infarct old.  IVCD, borderline criteria for LVH.  Nonspecific T abnormality.  No significant change from 01/11/2019.  08/30/2018: Atypical atrial flutter with 3:1 conduction, V rate 110 bpm, left axis deviation. LBBB.  Assessment     ICD-10-CM   1. Essential hypertension  I10 EKG 12-Lead  2. Chronic diastolic heart failure (HCC)  I50.32 PCV ECHOCARDIOGRAM COMPLETE  3. Coronary artery disease involving native coronary artery of native heart without angina pectoris  I25.10   4. Paroxysmal atrial fibrillation (HCC)  I48.0   5. Hypercholesterolemia  E78.00   6. Pacemaker  Medtronic  Medtronic Adapta Dr Peggyann Juba, leads only MRI  in situ 04/30/2015  Z95.0     No orders of the defined types were placed in this encounter.   Medications Discontinued During This Encounter  Medication Reason  . levothyroxine (SYNTHROID, LEVOTHROID) 50 MCG tablet Error    This patients CHA2DS2-VASc Score 7 (CHF, HTN, DM, Vasc, Age, F) and yearly risk of stroke >9.8%.   Recommendations:   MARELY APGAR  is a 75 y.o. female  with hypertension, type 2 diabetes mellitus with stage 3 CKD, chronic diastolic heart failure, mild hyperlipidemia on fenobibrate, nonobstructive CAD (Cath 2019), sick sinus syndrome status post dual-chamber pacemaker 2017, OSA on BiPAP, paroxysmal Afib and atrial flutter/atrial tachycardia.  Her last cardioversion was 09/07/2018 for atrial flutter.  She now presents for 75-monthfollow-up of diastolic heart failure and hypertension.  Patient is feeling well overall without specific complaints.  Notably her husband recently passed away, patient was previously the  primary caretaker for.  Patient now appears motivated to make significant diet and lifestyle modifications in order to improve her overall health and lose weight.  Patient's blood pressure is elevated above goal, therefore discussed antihypertensive medication adjustments but patient is resistant to making changes at this time and would rather focus on diet and lifestyle modification.  Discussed the risks of continued uncontrolled hypertension, patient verbalized understanding but still wishes to hold off on making medication changes at this time.  Had a similar discussion with patient regarding hyperlipidemia as her lipids remain uncontrolled and she is presently on a statin despite type 2 diabetes.  Again discussed with patient regarding risks of uncontrolled hyperlipidemia, particularly in view of her other cardiovascular risk factors but again patient prefers to hold off on making changes to medications at this time.  Reviewed and discussed with patient recent pacemaker transmissions, normal pacemaker function, details above.  She remains compliant with BiPAP use for obstructive sleep apnea.  There are no clinical signs of heart failure at this time and she is tolerating anticoagulation without bleeding diathesis.  Discussed at length with patient regarding diet and lifestyle modifications.  Patient will attempt to make significant changes over the next 3 months and will monitor her blood pressure on a daily basis and bring with her written log to her next appointment.  We will reevaluate hyperlipidemia and hypertension control at that time.  Follow-up in 3 months, sooner if needed,  for hypertension, hyperlipidemia, diastolic heart failure.   Alethia Berthold, PA-C 05/18/2020, 10:27 AM Office: [phone]

## 2020-05-19 ENCOUNTER — Other Ambulatory Visit: Payer: Self-pay | Admitting: Adult Health

## 2020-05-19 ENCOUNTER — Other Ambulatory Visit: Payer: Self-pay | Admitting: Cardiology

## 2020-05-19 DIAGNOSIS — G4733 Obstructive sleep apnea (adult) (pediatric): Secondary | ICD-10-CM | POA: Diagnosis not present

## 2020-05-24 ENCOUNTER — Other Ambulatory Visit: Payer: Self-pay | Admitting: Cardiology

## 2020-05-28 ENCOUNTER — Other Ambulatory Visit: Payer: Self-pay | Admitting: Cardiology

## 2020-06-01 DIAGNOSIS — R069 Unspecified abnormalities of breathing: Secondary | ICD-10-CM | POA: Diagnosis not present

## 2020-06-01 DIAGNOSIS — I5033 Acute on chronic diastolic (congestive) heart failure: Secondary | ICD-10-CM | POA: Diagnosis not present

## 2020-06-13 DIAGNOSIS — E1122 Type 2 diabetes mellitus with diabetic chronic kidney disease: Secondary | ICD-10-CM | POA: Diagnosis not present

## 2020-06-13 DIAGNOSIS — I129 Hypertensive chronic kidney disease with stage 1 through stage 4 chronic kidney disease, or unspecified chronic kidney disease: Secondary | ICD-10-CM | POA: Diagnosis not present

## 2020-06-18 DIAGNOSIS — Z0000 Encounter for general adult medical examination without abnormal findings: Secondary | ICD-10-CM | POA: Diagnosis not present

## 2020-06-18 DIAGNOSIS — I25118 Atherosclerotic heart disease of native coronary artery with other forms of angina pectoris: Secondary | ICD-10-CM | POA: Diagnosis not present

## 2020-06-18 DIAGNOSIS — N1832 Chronic kidney disease, stage 3b: Secondary | ICD-10-CM | POA: Diagnosis not present

## 2020-06-18 DIAGNOSIS — D6859 Other primary thrombophilia: Secondary | ICD-10-CM | POA: Diagnosis not present

## 2020-06-18 DIAGNOSIS — E1122 Type 2 diabetes mellitus with diabetic chronic kidney disease: Secondary | ICD-10-CM | POA: Diagnosis not present

## 2020-06-18 DIAGNOSIS — I110 Hypertensive heart disease with heart failure: Secondary | ICD-10-CM | POA: Diagnosis not present

## 2020-06-18 DIAGNOSIS — I4891 Unspecified atrial fibrillation: Secondary | ICD-10-CM | POA: Diagnosis not present

## 2020-06-18 DIAGNOSIS — I5032 Chronic diastolic (congestive) heart failure: Secondary | ICD-10-CM | POA: Diagnosis not present

## 2020-06-19 DIAGNOSIS — G4733 Obstructive sleep apnea (adult) (pediatric): Secondary | ICD-10-CM | POA: Diagnosis not present

## 2020-06-20 DIAGNOSIS — G4733 Obstructive sleep apnea (adult) (pediatric): Secondary | ICD-10-CM | POA: Diagnosis not present

## 2020-06-21 DIAGNOSIS — Z950 Presence of cardiac pacemaker: Secondary | ICD-10-CM | POA: Diagnosis not present

## 2020-06-21 DIAGNOSIS — Z45018 Encounter for adjustment and management of other part of cardiac pacemaker: Secondary | ICD-10-CM | POA: Diagnosis not present

## 2020-06-21 DIAGNOSIS — I495 Sick sinus syndrome: Secondary | ICD-10-CM | POA: Diagnosis not present

## 2020-06-28 ENCOUNTER — Encounter: Payer: Self-pay | Admitting: Adult Health

## 2020-06-28 ENCOUNTER — Other Ambulatory Visit: Payer: Self-pay

## 2020-06-28 ENCOUNTER — Ambulatory Visit: Payer: Medicare HMO | Admitting: Adult Health

## 2020-06-28 VITALS — BP 159/54 | HR 60 | Ht 62.0 in | Wt 195.0 lb

## 2020-06-28 DIAGNOSIS — B373 Candidiasis of vulva and vagina: Secondary | ICD-10-CM

## 2020-06-28 DIAGNOSIS — N898 Other specified noninflammatory disorders of vagina: Secondary | ICD-10-CM

## 2020-06-28 DIAGNOSIS — L439 Lichen planus, unspecified: Secondary | ICD-10-CM | POA: Diagnosis not present

## 2020-06-28 DIAGNOSIS — B3731 Acute candidiasis of vulva and vagina: Secondary | ICD-10-CM

## 2020-06-28 MED ORDER — TRIAMCINOLONE ACETONIDE 0.5 % EX OINT: TOPICAL_OINTMENT | Freq: Two times a day (BID) | CUTANEOUS | 3 refills | Status: DC

## 2020-06-28 MED ORDER — FLUCONAZOLE 150 MG PO TABS
ORAL_TABLET | ORAL | 1 refills | Status: DC
Start: 2020-06-28 — End: 2020-06-28

## 2020-06-28 NOTE — Progress Notes (Signed)
  Subjective:     Patient ID: [name], female   DOB: October 21, 1945, 75 y.o.   MRN: [id number]  HPI [name] is a 75 year old white female, widowed, PM in complaining of burning and itching on vulva.  PCP is Dr Shelia Media.  Review of Systems    Burning and itching on vulva Reviewed past medical,surgical, social and family history. Reviewed medications and allergies.  Objective:   Physical Exam BP (!) 159/54 (BP Location: Left Arm, Patient Position: Sitting, Cuff Size: Normal)   Pulse 60   Ht 5\' 2"  (1.575 m)   Wt 195 lb (88.5 kg)   BMI 35.67 kg/m  Skin warm and dry.Pelvic: external genitalia is normal in appearance is red and irritated, skin thin at introitus,vagina: atrophic,,urethra has no lesions or masses noted, cervix:smooth, uterus: normal size, shape and contour, non tender, no masses felt, adnexa: non tender  no masses felt. Painted with gentian violet.    Fall risk is low  Upstream - 06/28/20 1539       Pregnancy Intention Screening   Does the patient want to become pregnant in the next year? N/A    Does the patient's partner want to become pregnant in the next year? N/A    Would the patient like to discuss contraceptive options today? N/A      Contraception Wrap Up   Current Method No Method - Other Reason   postmenopausal   End Method No Method - Other Reason   postmenopausal   Contraception Counseling Provided No            Examination chaperoned by Levy Pupa LPN Assessment:     1. Itching in the vaginal area Painted with gentian violet  2. Vulvovaginal candidiasis Use nystatin cream Painted with gentian violet   3. Lichen planus atrophicus Use kenalog Meds ordered this encounter  Medications   DISCONTD: fluconazole (DIFLUCAN) 150 MG tablet    Sig: Take 1 now and 1 in 3 days    Dispense:  2 tablet    Refill:  1    Order Specific Question:   Supervising Provider    Answer:   Tania Ade H [[id number]]   triamcinolone ointment (KENALOG) 0.5 %    Sig:  Apply topically 2 (two) times daily.    Dispense:  30 g    Refill:  3    Order Specific Question:   Supervising Provider    Answer:   Florian Buff [[id number]]       Plan:     Follow up in 2 weeks

## 2020-06-29 DIAGNOSIS — E039 Hypothyroidism, unspecified: Secondary | ICD-10-CM | POA: Diagnosis not present

## 2020-06-29 DIAGNOSIS — E785 Hyperlipidemia, unspecified: Secondary | ICD-10-CM | POA: Diagnosis not present

## 2020-06-29 DIAGNOSIS — E559 Vitamin D deficiency, unspecified: Secondary | ICD-10-CM | POA: Diagnosis not present

## 2020-06-29 DIAGNOSIS — I129 Hypertensive chronic kidney disease with stage 1 through stage 4 chronic kidney disease, or unspecified chronic kidney disease: Secondary | ICD-10-CM | POA: Diagnosis not present

## 2020-06-29 DIAGNOSIS — E1122 Type 2 diabetes mellitus with diabetic chronic kidney disease: Secondary | ICD-10-CM | POA: Diagnosis not present

## 2020-07-02 DIAGNOSIS — E1165 Type 2 diabetes mellitus with hyperglycemia: Secondary | ICD-10-CM | POA: Diagnosis not present

## 2020-07-02 DIAGNOSIS — E119 Type 2 diabetes mellitus without complications: Secondary | ICD-10-CM | POA: Diagnosis not present

## 2020-07-02 DIAGNOSIS — I5033 Acute on chronic diastolic (congestive) heart failure: Secondary | ICD-10-CM | POA: Diagnosis not present

## 2020-07-02 DIAGNOSIS — Z794 Long term (current) use of insulin: Secondary | ICD-10-CM | POA: Diagnosis not present

## 2020-07-02 DIAGNOSIS — R069 Unspecified abnormalities of breathing: Secondary | ICD-10-CM | POA: Diagnosis not present

## 2020-07-08 IMAGING — DX DG CHEST 2V
2 series · 2 of 2 positions shown · non-contrast
Comparison: Chest x-ray dated 06/07/2017.

CLINICAL DATA: Fever, chills, mild RIGHT-sided chest pain.

EXAM:
CHEST - 2 VIEW

[w chest pa]
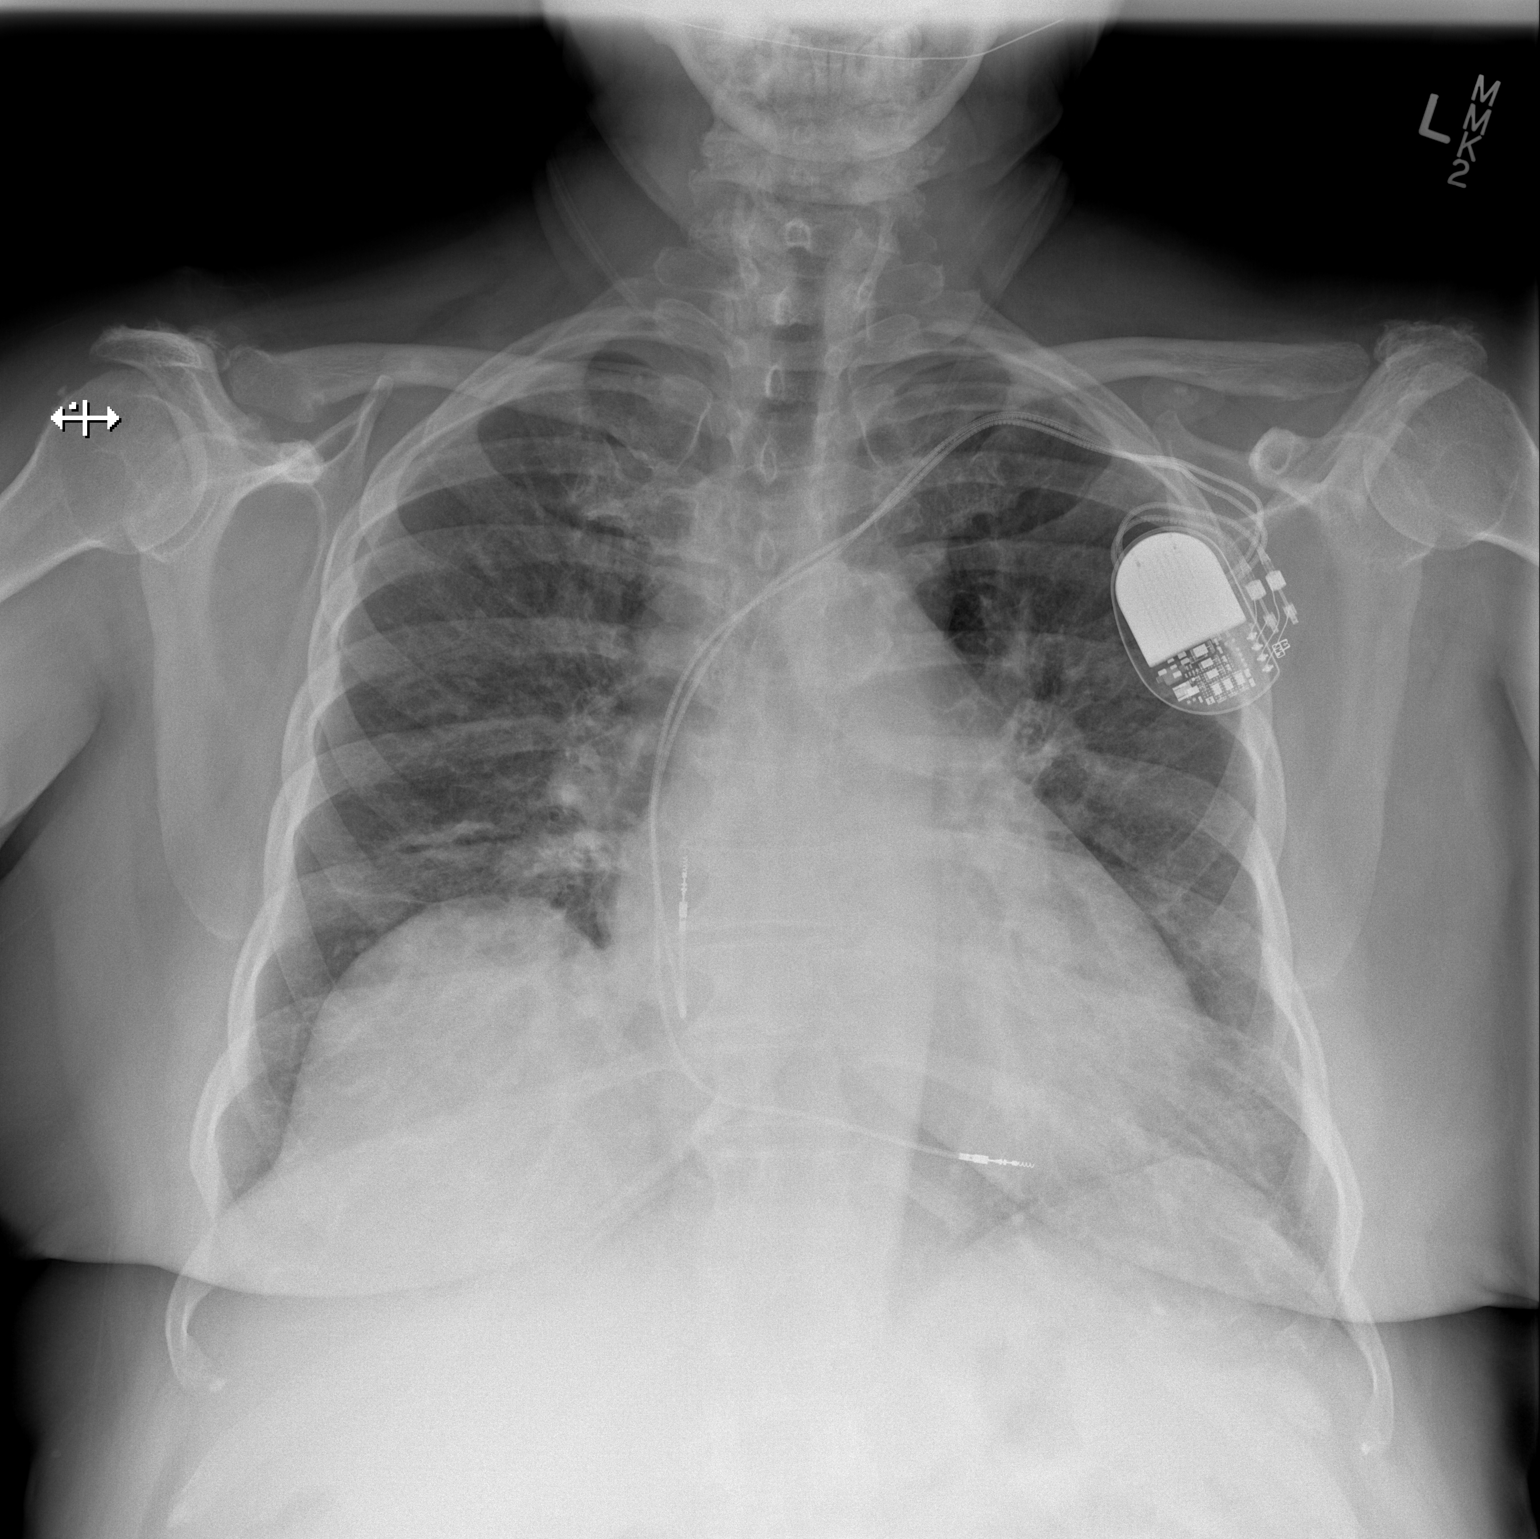

[w chest lat]
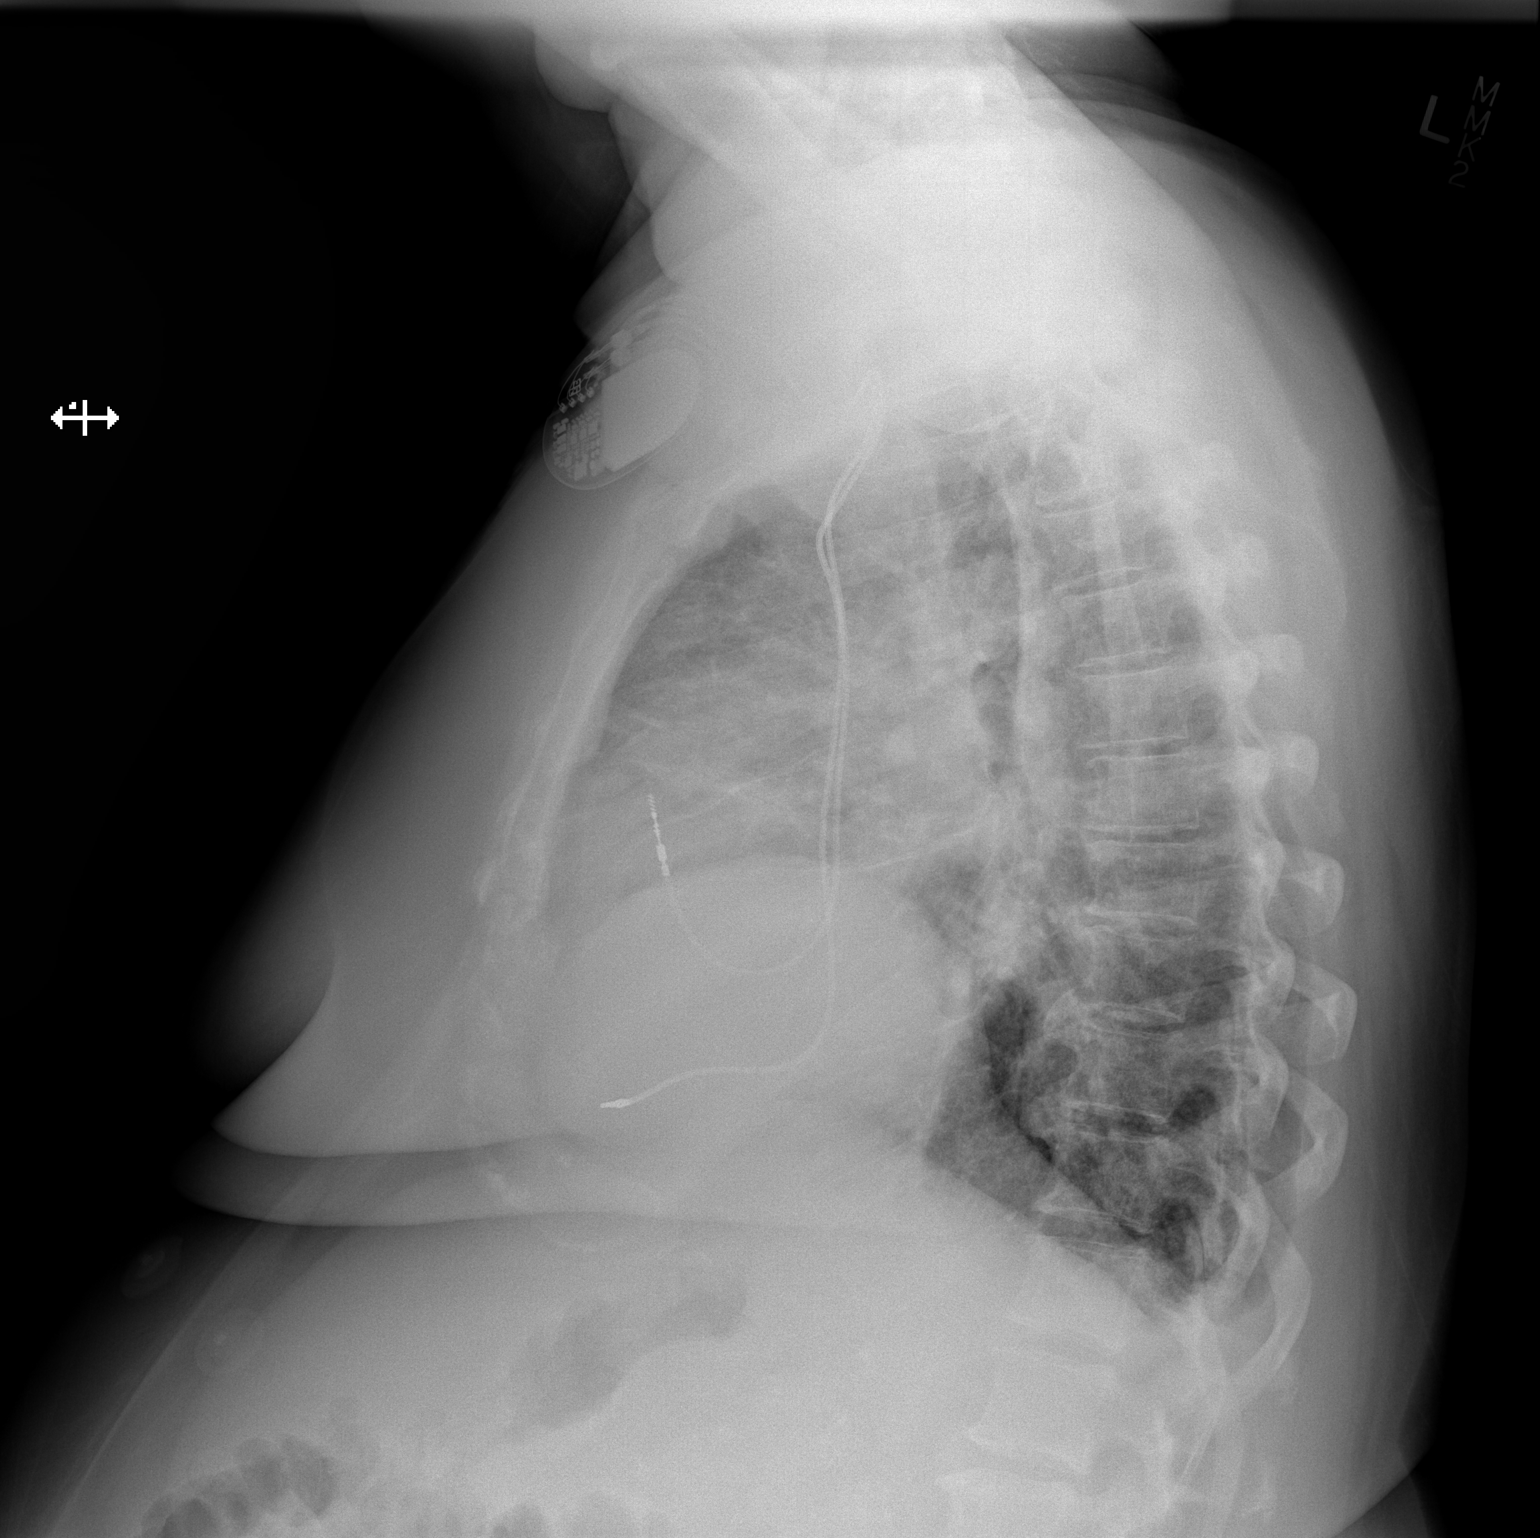

[2 of 2 positions shown; findings below may reference images not displayed]

FINDINGS: Mild cardiomegaly is stable. Overall cardiomediastinal silhouette is
stable. Chronic mild central pulmonary vascular congestion. Lungs
are otherwise clear. No pleural effusion or pneumothorax seen.

LEFT chest wall pacemaker/ICD apparatus appears stable in position.
No acute or suspicious osseous finding. Stable/chronic elevation of
the RIGHT hemidiaphragm.
IMPRESSION: Cardiomegaly with chronic mild central pulmonary vascular
congestion, suggesting mild chronic CHF. No evidence of overt
alveolar pulmonary edema. No evidence of pneumonia.

## 2020-07-10 ENCOUNTER — Encounter: Payer: Self-pay | Admitting: Obstetrics & Gynecology

## 2020-07-10 ENCOUNTER — Encounter: Payer: Self-pay | Admitting: Adult Health

## 2020-07-10 ENCOUNTER — Ambulatory Visit: Payer: Medicare HMO | Admitting: Adult Health

## 2020-07-10 ENCOUNTER — Other Ambulatory Visit: Payer: Self-pay

## 2020-07-10 ENCOUNTER — Other Ambulatory Visit (HOSPITAL_COMMUNITY)
Admission: RE | Admit: 2020-07-10 | Discharge: 2020-07-10 | Disposition: A | Discharge status: Home / Self Care | Payer: Medicare HMO | Source: Ambulatory Visit | Attending: Obstetrics & Gynecology | Admitting: Obstetrics & Gynecology

## 2020-07-10 ENCOUNTER — Ambulatory Visit (INDEPENDENT_AMBULATORY_CARE_PROVIDER_SITE_OTHER): Payer: Medicare HMO | Admitting: Obstetrics & Gynecology

## 2020-07-10 VITALS — BP 165/55 | HR 60 | Ht 62.0 in | Wt 192.0 lb

## 2020-07-10 DIAGNOSIS — L439 Lichen planus, unspecified: Secondary | ICD-10-CM

## 2020-07-10 DIAGNOSIS — D4959 Neoplasm of unspecified behavior of other genitourinary organ: Secondary | ICD-10-CM | POA: Diagnosis not present

## 2020-07-10 DIAGNOSIS — C519 Malignant neoplasm of vulva, unspecified: Secondary | ICD-10-CM | POA: Diagnosis not present

## 2020-07-10 NOTE — Progress Notes (Signed)
  Subjective:     Patient ID: [name], female   DOB: [birth year], 75 y.o.   MRN: [id number]  HPI [name] is a 75 year old white female, widowed PM back in follow up on itching and burning of vulva, no itching but burns still. PCP is Dr Shelia Media.   Review of Systems No tiching but has burning now inner labia Reviewed past medical,surgical, social and family history. Reviewed medications and allergies.     Objective:   Physical Exam BP (!) 165/55 (BP Location: Right Arm, Patient Position: Sitting, Cuff Size: Normal)   Pulse 60   Ht 5\' 2"  (1.575 m)   Wt 192 lb (87.1 kg)   BMI 35.12 kg/m  Skin warm and dry.Pelvic: external genitalia is normal in appearance no lesions,no redness or irritation, vagina: has ulceration like area right inner labia that burns and  is tender, has red, irritated area on right at introitus, Dr Elonda Husky in for co exam and he will biopsy today. See his notes      Assessment:      1. Lichen planus atrophicus    Plan:     Follow up per Dr Elonda Husky

## 2020-07-10 NOTE — Progress Notes (Signed)
Patient ID: [name], female   DOB: Jul 20, 1945, 75 y.o.   MRN: [id number]  No chief complaint on file.   HPI [name] is a 75 y.o. female.   HPI Indication: erythema, ulceration, and induration of the vulva Symptoms:   more painful than pruritic   Location:  labia minora on the right  Past Medical History:  Diagnosis Date   Arthritis    Diabetes mellitus without complication ([place])    Encounter for care of pacemaker 09/03/2018   History of chickenpox    Hx of psoriatic arthritis    Hyperlipemia    Hypertension    Hypertension 05/03/2018   Mitral valve disorders(424.0)    Obstructive sleep apnea    Pacemaker    Paroxysmal atrial fibrillation ([place]) 10/11/2016   Peripheral neuropathy    Renal disorder Kidney disease stage2   Thyroid disease hypothyroid   Urine incontinence    Vitamin D deficiency     Past Surgical History:  Procedure Laterality Date   CARDIAC CATHETERIZATION N/A 04/29/2015   Procedure: Temporary Pacemaker;  Surgeon: Adrian Prows, MD;  Location: [place] [facility];  Service: Cardiovascular;  Laterality: N/A;   CARDIOVERSION N/A 09/07/2018   Procedure: CARDIOVERSION;  Surgeon: Adrian Prows, MD;  Location: [place];  Service: Cardiovascular;  Laterality: N/A;   CARDIOVERSION     EP IMPLANTABLE DEVICE N/A 04/30/2015   Procedure: Pacemaker Implant;  Surgeon: Evans Lance, MD;  Location: [place] [facility];  Service: Cardiovascular;  Laterality: N/A;   INTRAVASCULAR PRESSURE WIRE/FFR STUDY N/A 08/31/2017   Procedure: INTRAVASCULAR PRESSURE WIRE/FFR STUDY;  Surgeon: Nigel Mormon, MD;  Location: [place] [facility];  Service: Cardiovascular;  Laterality: N/A;   LEFT AND RIGHT HEART CATHETERIZATION WITH CORONARY ANGIOGRAM N/A 09/30/2011   Procedure: LEFT AND RIGHT HEART CATHETERIZATION WITH CORONARY ANGIOGRAM;  Surgeon: Laverda Page, MD;  Location: [facility] CATH LAB;  Service: Cardiovascular;  Laterality: N/A;   RIGHT/LEFT HEART CATH AND CORONARY  ANGIOGRAPHY N/A 08/31/2017   Procedure: RIGHT/LEFT HEART CATH AND CORONARY ANGIOGRAPHY;  Surgeon: Nigel Mormon, MD;  Location: [place] [facility];  Service: Cardiovascular;  Laterality: N/A;   TOOTH EXTRACTION  63/84/6659   YAG LASER APPLICATION Right 9/35/7017   Procedure: YAG LASER APPLICATION;  Surgeon: Elta Guadeloupe T. Gershon Crane, MD;  Location: [facility];  Service: Ophthalmology;  Laterality: Right;  pt knows to arrive at 79:39   YAG LASER APPLICATION Left 0/03/90   Procedure: YAG LASER APPLICATION;  Surgeon: Rutherford Guys, MD;  Location: [facility];  Service: Ophthalmology;  Laterality: Left;    Family History  Problem Relation Age of Onset   Arthritis Mother    Diabetes Mother    Heart disease Mother    Hyperlipidemia Mother    Hypertension Mother    Arthritis Father    Asthma Father    Heart attack Father    Hyperlipidemia Father    Hypertension Father    Stroke Father    Arthritis Sister    Diabetes Sister    Hypertension Sister    Arthritis Brother    Heart attack Brother    Heart disease Brother    Hyperlipidemia Brother    Hypertension Brother    Alcohol abuse Brother    Arthritis Brother    Diabetes Brother    Early death Brother    Heart disease Brother    Hyperlipidemia Brother    Hypertension Brother    Arthritis Sister    Cancer Sister  Diabetes Sister    Hyperlipidemia Sister    Hypertension Sister    Stroke Sister    Cancer Sister    Hyperlipidemia Sister    Hypertension Sister    Hypertension Daughter     Social History Social History   Tobacco Use   Smoking status: Never   Smokeless tobacco: Never  Vaping Use   Vaping Use: Never used  Substance Use Topics   Alcohol use: No    Alcohol/week: 0.0 standard drinks   Drug use: No    Allergies  Allergen Reactions   Hydrocodone Other (See Comments)    Lethargic    Invokana [Canagliflozin] Palpitations and Other (See Comments)    Made heart race   Oxycodone Other (See Comments)    Lethargic      Current Outpatient Medications  Medication Sig Dispense Refill   amiodarone (PACERONE) 200 MG tablet Take 1/2 (one-half) tablet by mouth once daily 45 tablet 3   Cholecalciferol (VITAMIN D-3) 1000 units CAPS Take 2,000 Units by mouth daily.     ELIQUIS 5 MG TABS tablet Take 1 tablet by mouth twice daily 180 tablet 2   EUTHYROX 88 MCG tablet Take 88 mcg by mouth daily.     fenofibrate 160 MG tablet Take 160 mg by mouth daily with supper.      insulin aspart protamine- aspart (NOVOLOG MIX 70/30) (70-30) 100 UNIT/ML injection Inject 10-70 Units into the skin See admin instructions. Inject 45 units into the skin with breakfast, 35 units with supper (may adjust based on blood sugar readings)     isosorbide mononitrate (IMDUR) 60 MG 24 hr tablet Take 1 tablet (60 mg total) by mouth daily. 90 tablet 3   metoprolol tartrate (LOPRESSOR) 50 MG tablet Take 1 tablet (50 mg total) by mouth 2 (two) times daily. 180 tablet 3   nitroGLYCERIN (NITROSTAT) 0.4 MG SL tablet Place 1 tablet (0.4 mg total) under the tongue every 5 (five) minutes as needed for chest pain. 25 tablet 1   nystatin cream (MYCOSTATIN) APPLY  CREAM TOPICALLY TWICE DAILY AS NEEDED FOR  IRRIATATION  OF  AFFECTED  AREA 30 g 0   olmesartan-hydrochlorothiazide (BENICAR HCT) 40-25 MG tablet Take 1 tablet by mouth once daily 90 tablet 0   Omega-3 Fatty Acids (FISH OIL OMEGA-3 PO) Take by mouth daily. (Patient not taking: Reported on 07/10/2020)     spironolactone (ALDACTONE) 25 MG tablet Take 1 tablet by mouth once daily 90 tablet 0   SURE COMFORT INSULIN SYRINGE 31G X 5/16" 0.5 ML MISC      torsemide (DEMADEX) 5 MG tablet Take 1 tablet by mouth as needed.     triamcinolone ointment (KENALOG) 0.5 % Apply topically 2 (two) times daily. 30 g 3   vitamin B-12 (CYANOCOBALAMIN) 1000 MCG tablet Take 2,000 mcg by mouth daily. (Patient not taking: Reported on 07/10/2020)     No current facility-administered medications for this visit.    Review of  Systems Review of Systems  There were no vitals taken for this visit.  Physical Exam Physical Exam  Data Reviewed   Assessment    Prepping with Betadine Local anesthesia with 1% Buffered Lidocaine 4  mm punch biopsy performed per protocol Silver Nitrate applied:  No: 2 figure of eight 3-0 ethilon sutures placed(pt is on eliquis) Well tolerated  Specimen appropriately identified and sent to pathology    Plan    Follow-up:  10 days      Florian Buff 07/10/2020, 4:35  PM       

## 2020-07-12 ENCOUNTER — Other Ambulatory Visit: Payer: Self-pay | Admitting: *Deleted

## 2020-07-12 DIAGNOSIS — D4959 Neoplasm of unspecified behavior of other genitourinary organ: Secondary | ICD-10-CM

## 2020-07-13 ENCOUNTER — Telehealth: Payer: Self-pay | Admitting: *Deleted

## 2020-07-13 NOTE — Telephone Encounter (Signed)
Attempted to reach the patient to schedule a new patient appt, no answer and unable to leave message. (Voicemail not set up)

## 2020-07-13 NOTE — Telephone Encounter (Signed)
Spoke with patient and scheduled a new patient appt on 7/8 at 10:30 am with Dr Berline Lopes; patient given an arrival time of 10 am. Patient given the phone number for the clinic and the policy for mask/visitors. Patient aware of the address of the clinic

## 2020-07-18 ENCOUNTER — Encounter: Payer: Self-pay | Admitting: Gynecologic Oncology

## 2020-07-19 DIAGNOSIS — G4733 Obstructive sleep apnea (adult) (pediatric): Secondary | ICD-10-CM | POA: Diagnosis not present

## 2020-07-19 NOTE — H&P (View-Only) (Signed)
GYNECOLOGIC ONCOLOGY NEW PATIENT CONSULTATION   Patient Name: [name]  Patient Age: 75 y.o. Date of Service: 07/20/20 Referring Provider: Darlis Loan MD  Primary Care Provider: Deland Pretty, MD Consulting Provider: Jeral Pinch, MD   Assessment/Plan:  Postmenopausal patient with likely long history of chronic vulvar dermatoses now with at least stage I vulvar cancer.  I discussed with the patient and her son recent biopsy, which confirmed squamous cell carcinoma of the vulva.  Given long history of symptoms as well as vulvar findings that are consistent with lichen sclerosis, I suspect that this is squamous cell carcinoma that has arisen within the setting of lichen sclerosis.  The patient is very tender on exam today and given the location of her lesion, situated between the urethra and clitoris, I am having difficulty determining what may just be changes related to lichen versus malignancy.  I recommended that we plan to do an exam under some light anesthesia with biopsies.  I have also asked the pathologist to review her prior biopsy for a depth of invasion.  As I suspect she will be a candidate for surgery, given her significant cardiac history, my office will work on cardiac clearance.  PET scan was ordered today for initial staging purposes.  If exam under anesthesia and additional biopsies confirmed the patient is a candidate for sentinel lymph node biopsies and has a tumor depth to warrant lymph node evaluation, then we will plan for surgery at [facility] with a partial radical vulvectomy and bilateral sentinel lymph node biopsy.  Given her strong family history of BRCA associated cancers (has a sister who died from pancreatic cancer about 15 years ago and another sister who was diagnosed with ovarian cancer 9 or 10 years ago although died from an aneurysm), I discussed with the patient plan for referral for genetic counseling.  A copy of this note was sent to the patient's referring  provider.   70 minutes of total time was spent for this patient encounter, including preparation, face-to-face counseling with the patient and coordination of care, and documentation of the encounter.   Jeral Pinch, MD  Division of Gynecologic Oncology  Department of Obstetrics and Gynecology  [facility]  ___________________________________________  Chief Complaint: Chief Complaint  Patient presents with   Vulvar cancer [facility])    History of Present Illness:  [name] is a 75 y.o. y.o. female who is seen in consultation at the request of Dr. Elonda Husky for an evaluation of newly diagnosed squamous cell carcinoma of the vulva.  Patient reports at least a 13-year history of vulvar pruritus.  She notes having the symptoms for quite some time before seeing anyone. She has been followed at least for the last several years by Derrek Monaco, and being treated with triamcinolone as well as nystatin for lichen sclerosis and lichen planus.  She uses medications almost all the time and has had some improvement but not complete relief.  About 2 weeks before her last visit, she had worsening of her vulvar pruritus as well as burning when the urine came in contact with her vulva.  She has not seen any difference in her symptoms with the use of the steroid cream and nystatin in the last 2 weeks.  When she most recently saw Dr. Marciano Sequin, she underwent biopsy that confirmed squamous cell carcinoma.  Patient's medical history is complicated by obstructive sleep apnea, paroxysmal atrial fibrillation, chronic diastolic heart failure, coronary artery disease, and hypertension.  Her last  cardioversion for atrial flutter was in August 2020.  She had a pacemaker placed in 2017 for sick sinus syndrome.  Her history is also complicated by type 2 diabetes, stage III kidney disease.  She reports getting short of breath with any sort of exertion.  She cannot walk long distances or up a flight  of stairs without stopping multiple times to catch her breath.  She endorses having a good appetite although it has been decreased some very recently secondary to stress and anxiety.  She denies any nausea or emesis.  She reports about a year long history of fecal urgency as well as needing to have a bowel movement every time she voids.  She has some urinary urgency as well as urgency incontinence.  She denies any lower extremity edema.  Patient lives in [place] by herself.  She comes in with her son today.  She is retired, although thinking about getting a job again.  She denies any tobacco or alcohol use.  PAST MEDICAL HISTORY:  Past Medical History:  Diagnosis Date   Arthritis    Diabetes mellitus without complication ([place])    Encounter for care of pacemaker 09/03/2018   History of chickenpox    Hx of psoriatic arthritis    Hyperlipemia    Hypertension    Hypertension 05/03/2018   Mitral valve disorders(424.0)    Obstructive sleep apnea    Pacemaker    Paroxysmal atrial fibrillation ([place]) 10/11/2016   Peripheral neuropathy    Renal disorder Kidney disease stage2   Thyroid disease hypothyroid   Urine incontinence    Vitamin D deficiency      PAST SURGICAL HISTORY:  Past Surgical History:  Procedure Laterality Date   BACK SURGERY     BACK SURGERY     CARDIAC CATHETERIZATION N/A 04/29/2015   Procedure: Temporary Pacemaker;  Surgeon: Adrian Prows, MD;  Location: [place] [facility];  Service: Cardiovascular;  Laterality: N/A;   CARDIOVERSION N/A 09/07/2018   Procedure: CARDIOVERSION;  Surgeon: Adrian Prows, MD;  Location: [place];  Service: Cardiovascular;  Laterality: N/A;   CARDIOVERSION     EP IMPLANTABLE DEVICE N/A 04/30/2015   Procedure: Pacemaker Implant;  Surgeon: Evans Lance, MD;  Location: [place] [facility];  Service: Cardiovascular;  Laterality: N/A;   INTRAVASCULAR PRESSURE WIRE/FFR STUDY N/A 08/31/2017   Procedure: INTRAVASCULAR PRESSURE WIRE/FFR STUDY;   Surgeon: Nigel Mormon, MD;  Location: [place] [facility];  Service: Cardiovascular;  Laterality: N/A;   LEFT AND RIGHT HEART CATHETERIZATION WITH CORONARY ANGIOGRAM N/A 09/30/2011   Procedure: LEFT AND RIGHT HEART CATHETERIZATION WITH CORONARY ANGIOGRAM;  Surgeon: Laverda Page, MD;  Location: [facility] C[facility];  Service: Cardiovascular;  Laterality: N/A;   RIGHT/LEFT HEART CATH AND CORONARY ANGIOGRAPHY N/A 08/31/2017   Procedure: RIGHT/LEFT HEART CATH AND CORONARY ANGIOGRAPHY;  Surgeon: Nigel Mormon, MD;  Location: [place] [facility];  Service: Cardiovascular;  Laterality: N/A;   TOOTH EXTRACTION  10/07/2018   TUBAL LIGATION     YAG LASER APPLICATION Right 59/16/3846   Procedure: YAG LASER APPLICATION;  Surgeon: Elta Guadeloupe T. Gershon Crane, MD;  Location[facility];  Service: Ophthalmology;  Laterality: Right;  pt knows to arrive at 65:99   YAG LASER APPLICATION Left 35/70/1779   Procedure: YAG LASER APPLICATION;  Surgeon: Rutherford Guys, MD;  [facility];  Service: Ophthalmology;  Laterality: Left;    OB/GYN HISTORY:  OB History  Gravida Para Term Preterm AB Living  5 3  3  SAB IAB Ectopic Multiple Live Births               # Outcome Date GA Lbr Len/2nd Weight Sex Delivery Anes PTL Lv  5 Gravida           4 Gravida           3 Para           2 Para           1 Para             No LMP recorded. Patient is postmenopausal.  Age at menarche: 11 Age at menopause: 15 Hx of HRT: Denies Hx of STDs: Denies Last pap: Unsure History of abnormal pap smears: Denies  SCREENING STUDIES:  Last mammogram: 2015  Last colonoscopy: yes, in [place], unsure when  MEDICATIONS: Outpatient Encounter Medications as of 07/20/2020  Medication Sig   amiodarone (PACERONE) 200 MG tablet Take 1/2 (one-half) tablet by mouth once daily (Patient taking differently: Take 100 mg by mouth daily.)   ELIQUIS 5 MG TABS tablet Take 1 tablet by mouth twice daily (Patient taking differently: Take 5 mg by  mouth 2 (two) times daily.)   EUTHYROX 88 MCG tablet Take 88 mcg by mouth daily.   fenofibrate 160 MG tablet Take 160 mg by mouth daily with supper.    insulin aspart protamine- aspart (NOVOLOG MIX 70/30) (70-30) 100 UNIT/ML injection Inject 35-45 Units into the skin See admin instructions. Inject 45 units into the skin with breakfast, 35 units with supper (may adjust based on blood sugar readings)   isosorbide mononitrate (IMDUR) 60 MG 24 hr tablet Take 1 tablet (60 mg total) by mouth daily.   metoprolol tartrate (LOPRESSOR) 50 MG tablet Take 1 tablet (50 mg total) by mouth 2 (two) times daily.   olmesartan-hydrochlorothiazide (BENICAR HCT) 40-25 MG tablet Take 1 tablet by mouth once daily (Patient taking differently: Take 1 tablet by mouth daily.)   spironolactone (ALDACTONE) 25 MG tablet Take 1 tablet by mouth once daily (Patient taking differently: Take 25 mg by mouth daily.)   torsemide (DEMADEX) 5 MG tablet Take 5 mg by mouth daily as needed (swelling).   triamcinolone ointment (KENALOG) 0.5 % Apply topically 2 (two) times daily. (Patient taking differently: Apply 1 application topically 2 (two) times daily.)   Cholecalciferol (VITAMIN D-3) 1000 units CAPS Take 2,000 Units by mouth daily.   nitroGLYCERIN (NITROSTAT) 0.4 MG SL tablet Place 1 tablet (0.4 mg total) under the tongue every 5 (five) minutes as needed for chest pain.   nystatin cream (MYCOSTATIN) APPLY  CREAM TOPICALLY TWICE DAILY AS NEEDED FOR  IRRIATATION  OF  AFFECTED  AREA   Omega-3 Fatty Acids (FISH OIL OMEGA-3 PO) Take by mouth daily. (Patient not taking: No sig reported)   SURE COMFORT INSULIN SYRINGE 31G X 5/16" 0.5 ML MISC    vitamin B-12 (CYANOCOBALAMIN) 1000 MCG tablet Take 2,000 mcg by mouth daily.   No facility-administered encounter medications on file as of 07/20/2020.    ALLERGIES:  Allergies  Allergen Reactions   Hydrocodone Other (See Comments)    Lethargic    Invokana [Canagliflozin] Palpitations and Other  (See Comments)    Made heart race   Oxycodone Other (See Comments)    Lethargic      FAMILY HISTORY:  Family History  Problem Relation Age of Onset   Arthritis Mother    Diabetes Mother    Heart disease Mother    Hyperlipidemia  Mother    Hypertension Mother    Arthritis Father    Asthma Father    Heart attack Father    Hyperlipidemia Father    Hypertension Father    Stroke Father    Arthritis Sister    Diabetes Sister    Hypertension Sister    Arthritis Sister    Cancer Sister    Diabetes Sister    Hyperlipidemia Sister    Hypertension Sister    Stroke Sister    Ovarian cancer Sister    Pancreatic cancer Sister    Cancer Sister    Hyperlipidemia Sister    Hypertension Sister    Arthritis Brother    Heart attack Brother    Heart disease Brother    Hyperlipidemia Brother    Hypertension Brother    Alcohol abuse Brother    Arthritis Brother    Diabetes Brother    Early death Brother    Heart disease Brother    Hyperlipidemia Brother    Hypertension Brother    Hypertension Daughter    Colon cancer Neg Hx    Breast cancer Neg Hx    Endometrial cancer Neg Hx    Prostate cancer Neg Hx      SOCIAL HISTORY:  Social Connections: Not on file   REVIEW OF SYSTEMS:  Pertinent positives include pain with urination, vulvar itching, pelvic pain, easy bruising and bleeding Denies appetite changes, fevers, chills, fatigue, unexplained weight changes. Denies hearing loss, neck lumps or masses, mouth sores, ringing in ears or voice changes. Denies cough or wheezing.   Denies chest pain or palpitations. Denies leg swelling. Denies abdominal distention, pain, blood in stools, constipation, diarrhea, nausea, vomiting, or early satiety. Denies pain with intercourse, frequency, hematuria or incontinence. Denies hot flashes, vaginal bleeding or vaginal discharge.   Denies joint pain, back pain or muscle pain/cramps. Denies itching, rash, or wounds. Denies dizziness,  headaches, numbness or seizures. Denies swollen lymph nodes or glands. Denies anxiety, depression, confusion, or decreased concentration.  Physical Exam:  Vital Signs for this encounter:  Blood pressure (!) 195/54, pulse 60, temperature (!) 97.1 F (36.2 C), temperature source Tympanic, resp. rate 18, height 5' 2"  (1.575 m), weight 186 lb (84.4 kg), SpO2 94 %. Body mass index is 34.02 kg/m. General: Alert, oriented, no acute distress.  HEENT: Normocephalic, atraumatic. Sclera anicteric.  Chest: Clear to auscultation bilaterally. No wheezes, rhonchi, or rales. Cardiovascular: Regular rate and rhythm, no murmurs, rubs, or gallops.  Abdomen: Obese. Normoactive bowel sounds. Soft, nondistended, nontender to palpation. No masses or hepatosplenomegaly appreciated. No palpable fluid wave.  Extremities: Grossly normal range of motion. Warm, well perfused. No edema bilaterally.  Skin: No rashes or lesions.  Lymphatics: No cervical, supraclavicular, or inguinal adenopathy.  GU: External female genitalia notable for ecchymosis on bilateral labia majora.  There is significant loss of architecture of the inner labia consistent with a history of lichen sclerosis.  Patient has some ulcerative, desquamative, and hyperkeratotic areas spanning from the inner right labia to the inner left labia and across the tissue between the urethra and the clitoris.  She is extremely sensitive to the touch.  Prior biopsy site with suture still in place.  Speculum exam and bimanual exam deferred given her discomfort.  LABORATORY AND RADIOLOGIC DATA:  Outside medical records were reviewed to synthesize the above history, along with the history and physical obtained during the visit.   Lab Results  Component Value Date   WBC 7.1 08/26/2019   HGB 13.6  08/26/2019   HCT 43.0 08/26/2019   PLT 155 08/26/2019   GLUCOSE 176 (H) 08/26/2019   CHOL 168 08/29/2017   TRIG 544 (H) 08/29/2017   HDL 19 (L) 08/29/2017   LDLCALC  UNABLE TO CALCULATE IF TRIGLYCERIDE OVER 400 mg/dL 08/29/2017   ALT 23 06/30/2018   AST 25 06/30/2018   NA 136 08/26/2019   K 4.7 08/26/2019   CL 100 08/26/2019   CREATININE 1.34 (H) 08/26/2019   BUN 30 (H) 08/26/2019   CO2 27 08/26/2019   TSH 4.404 08/26/2019   INR 1.78 (H) 05/02/2015   HGBA1C 7.3 (H) 09/30/2015   Pathology: A. VULVA, RIGHT, BIOPSY:  - Invasive poorly differentiated squamous cell carcinoma.  - See comment

## 2020-07-19 NOTE — Progress Notes (Signed)
GYNECOLOGIC ONCOLOGY NEW PATIENT CONSULTATION   Patient Name: [name]  Patient Age: 75 y.o. Date of Service: 07/20/20 Referring Provider: Darlis Loan MD  Primary Care Provider: Deland Pretty, MD Consulting Provider: Jeral Pinch, MD   Assessment/Plan:  Postmenopausal patient with likely long history of chronic vulvar dermatoses now with at least stage I vulvar cancer.  I discussed with the patient and her son recent biopsy, which confirmed squamous cell carcinoma of the vulva.  Given long history of symptoms as well as vulvar findings that are consistent with lichen sclerosis, I suspect that this is squamous cell carcinoma that has arisen within the setting of lichen sclerosis.  The patient is very tender on exam today and given the location of her lesion, situated between the urethra and clitoris, I am having difficulty determining what may just be changes related to lichen versus malignancy.  I recommended that we plan to do an exam under some light anesthesia with biopsies.  I have also asked the pathologist to review her prior biopsy for a depth of invasion.  As I suspect she will be a candidate for surgery, given her significant cardiac history, my office will work on cardiac clearance.  PET scan was ordered today for initial staging purposes.  If exam under anesthesia and additional biopsies confirmed the patient is a candidate for sentinel lymph node biopsies and has a tumor depth to warrant lymph node evaluation, then we will plan for surgery at [facility] with a partial radical vulvectomy and bilateral sentinel lymph node biopsy.  Given her strong family history of BRCA associated cancers (has a sister who died from pancreatic cancer about 15 years ago and another sister who was diagnosed with ovarian cancer 9 or 10 years ago although died from an aneurysm), I discussed with the patient plan for referral for genetic counseling.  A copy of this note was sent to the patient's referring  provider.   70 minutes of total time was spent for this patient encounter, including preparation, face-to-face counseling with the patient and coordination of care, and documentation of the encounter.   Jeral Pinch, MD  Division of Gynecologic Oncology  Department of Obstetrics and Gynecology  [facility]  ___________________________________________  Chief Complaint: Chief Complaint  Patient presents with   Vulvar cancer [facility])    History of Present Illness:  [name] is a 75 y.o. y.o. female who is seen in consultation at the request of Dr. Elonda Husky for an evaluation of newly diagnosed squamous cell carcinoma of the vulva.  Patient reports at least a 13-year history of vulvar pruritus.  She notes having the symptoms for quite some time before seeing anyone. She has been followed at least for the last several years by Derrek Monaco, and being treated with triamcinolone as well as nystatin for lichen sclerosis and lichen planus.  She uses medications almost all the time and has had some improvement but not complete relief.  About 2 weeks before her last visit, she had worsening of her vulvar pruritus as well as burning when the urine came in contact with her vulva.  She has not seen any difference in her symptoms with the use of the steroid cream and nystatin in the last 2 weeks.  When she most recently saw Dr. Marciano Sequin, she underwent biopsy that confirmed squamous cell carcinoma.  Patient's medical history is complicated by obstructive sleep apnea, paroxysmal atrial fibrillation, chronic diastolic heart failure, coronary artery disease, and hypertension.  Her last  cardioversion for atrial flutter was in August 2020.  She had a pacemaker placed in 2017 for sick sinus syndrome.  Her history is also complicated by type 2 diabetes, stage III kidney disease.  She reports getting short of breath with any sort of exertion.  She cannot walk long distances or up a flight  of stairs without stopping multiple times to catch her breath.  She endorses having a good appetite although it has been decreased some very recently secondary to stress and anxiety.  She denies any nausea or emesis.  She reports about a year long history of fecal urgency as well as needing to have a bowel movement every time she voids.  She has some urinary urgency as well as urgency incontinence.  She denies any lower extremity edema.  Patient lives in [place] by herself.  She comes in with her son today.  She is retired, although thinking about getting a job again.  She denies any tobacco or alcohol use.  PAST MEDICAL HISTORY:  Past Medical History:  Diagnosis Date   Arthritis    Diabetes mellitus without complication ([place])    Encounter for care of pacemaker 09/03/2018   History of chickenpox    Hx of psoriatic arthritis    Hyperlipemia    Hypertension    Hypertension 05/03/2018   Mitral valve disorders(424.0)    Obstructive sleep apnea    Pacemaker    Paroxysmal atrial fibrillation ([place]) 10/11/2016   Peripheral neuropathy    Renal disorder Kidney disease stage2   Thyroid disease hypothyroid   Urine incontinence    Vitamin D deficiency      PAST SURGICAL HISTORY:  Past Surgical History:  Procedure Laterality Date   BACK SURGERY     BACK SURGERY     CARDIAC CATHETERIZATION N/A 04/29/2015   Procedure: Temporary Pacemaker;  Surgeon: Adrian Prows, MD;  Location: [place] [facility];  Service: Cardiovascular;  Laterality: N/A;   CARDIOVERSION N/A 09/07/2018   Procedure: CARDIOVERSION;  Surgeon: Adrian Prows, MD;  Location: [place];  Service: Cardiovascular;  Laterality: N/A;   CARDIOVERSION     EP IMPLANTABLE DEVICE N/A 04/30/2015   Procedure: Pacemaker Implant;  Surgeon: Evans Lance, MD;  Location: [place] [facility];  Service: Cardiovascular;  Laterality: N/A;   INTRAVASCULAR PRESSURE WIRE/FFR STUDY N/A 08/31/2017   Procedure: INTRAVASCULAR PRESSURE WIRE/FFR STUDY;   Surgeon: Nigel Mormon, MD;  Location: [place] [facility];  Service: Cardiovascular;  Laterality: N/A;   LEFT AND RIGHT HEART CATHETERIZATION WITH CORONARY ANGIOGRAM N/A 09/30/2011   Procedure: LEFT AND RIGHT HEART CATHETERIZATION WITH CORONARY ANGIOGRAM;  Surgeon: Laverda Page, MD;  Location: [facility];  Service: Cardiovascular;  Laterality: N/A;   RIGHT/LEFT HEART CATH AND CORONARY ANGIOGRAPHY N/A 08/31/2017   Procedure: RIGHT/LEFT HEART CATH AND CORONARY ANGIOGRAPHY;  Surgeon: Nigel Mormon, MD;  Location: [place] [facility];  Service: Cardiovascular;  Laterality: N/A;   TOOTH EXTRACTION  10/07/2018   TUBAL LIGATION     YAG LASER APPLICATION Right 95/18/8416   Procedure: YAG LASER APPLICATION;  Surgeon: Elta Guadeloupe T. Gershon Crane, MD;  Location: [facility];  Service: Ophthalmology;  Laterality: Right;  pt knows to arrive at 60:63   YAG LASER APPLICATION Left 01/60/1093   Procedure: YAG LASER APPLICATION;  Surgeon: Rutherford Guys, MD;  Location: [facility];  Service: Ophthalmology;  Laterality: Left;    OB/GYN HISTORY:  OB History  Gravida Para Term Preterm AB Living  5 3  3  SAB IAB Ectopic Multiple Live Births               # Outcome Date GA Lbr Len/2nd Weight Sex Delivery Anes PTL Lv  5 Gravida           4 Gravida           3 Para           2 Para           1 Para             No LMP recorded. Patient is postmenopausal.  Age at menarche: 50 Age at menopause: 61 Hx of HRT: Denies Hx of STDs: Denies Last pap: Unsure History of abnormal pap smears: Denies  SCREENING STUDIES:  Last mammogram: 2015  Last colonoscopy: yes, in [place], unsure when  MEDICATIONS: Outpatient Encounter Medications as of 07/20/2020  Medication Sig   amiodarone (PACERONE) 200 MG tablet Take 1/2 (one-half) tablet by mouth once daily (Patient taking differently: Take 100 mg by mouth daily.)   ELIQUIS 5 MG TABS tablet Take 1 tablet by mouth twice daily (Patient taking differently: Take 5 mg by  mouth 2 (two) times daily.)   EUTHYROX 88 MCG tablet Take 88 mcg by mouth daily.   fenofibrate 160 MG tablet Take 160 mg by mouth daily with supper.    insulin aspart protamine- aspart (NOVOLOG MIX 70/30) (70-30) 100 UNIT/ML injection Inject 35-45 Units into the skin See admin instructions. Inject 45 units into the skin with breakfast, 35 units with supper (may adjust based on blood sugar readings)   isosorbide mononitrate (IMDUR) 60 MG 24 hr tablet Take 1 tablet (60 mg total) by mouth daily.   metoprolol tartrate (LOPRESSOR) 50 MG tablet Take 1 tablet (50 mg total) by mouth 2 (two) times daily.   olmesartan-hydrochlorothiazide (BENICAR HCT) 40-25 MG tablet Take 1 tablet by mouth once daily (Patient taking differently: Take 1 tablet by mouth daily.)   spironolactone (ALDACTONE) 25 MG tablet Take 1 tablet by mouth once daily (Patient taking differently: Take 25 mg by mouth daily.)   torsemide (DEMADEX) 5 MG tablet Take 5 mg by mouth daily as needed (swelling).   triamcinolone ointment (KENALOG) 0.5 % Apply topically 2 (two) times daily. (Patient taking differently: Apply 1 application topically 2 (two) times daily.)   Cholecalciferol (VITAMIN D-3) 1000 units CAPS Take 2,000 Units by mouth daily.   nitroGLYCERIN (NITROSTAT) 0.4 MG SL tablet Place 1 tablet (0.4 mg total) under the tongue every 5 (five) minutes as needed for chest pain.   nystatin cream (MYCOSTATIN) APPLY  CREAM TOPICALLY TWICE DAILY AS NEEDED FOR  IRRIATATION  OF  AFFECTED  AREA   Omega-3 Fatty Acids (FISH OIL OMEGA-3 PO) Take by mouth daily. (Patient not taking: No sig reported)   SURE COMFORT INSULIN SYRINGE 31G X 5/16" 0.5 ML MISC    vitamin B-12 (CYANOCOBALAMIN) 1000 MCG tablet Take 2,000 mcg by mouth daily.   No facility-administered encounter medications on file as of 07/20/2020.    ALLERGIES:  Allergies  Allergen Reactions   Hydrocodone Other (See Comments)    Lethargic    Invokana [Canagliflozin] Palpitations and Other  (See Comments)    Made heart race   Oxycodone Other (See Comments)    Lethargic      FAMILY HISTORY:  Family History  Problem Relation Age of Onset   Arthritis Mother    Diabetes Mother    Heart disease Mother    Hyperlipidemia  Mother    Hypertension Mother    Arthritis Father    Asthma Father    Heart attack Father    Hyperlipidemia Father    Hypertension Father    Stroke Father    Arthritis Sister    Diabetes Sister    Hypertension Sister    Arthritis Sister    Cancer Sister    Diabetes Sister    Hyperlipidemia Sister    Hypertension Sister    Stroke Sister    Ovarian cancer Sister    Pancreatic cancer Sister    Cancer Sister    Hyperlipidemia Sister    Hypertension Sister    Arthritis Brother    Heart attack Brother    Heart disease Brother    Hyperlipidemia Brother    Hypertension Brother    Alcohol abuse Brother    Arthritis Brother    Diabetes Brother    Early death Brother    Heart disease Brother    Hyperlipidemia Brother    Hypertension Brother    Hypertension Daughter    Colon cancer Neg Hx    Breast cancer Neg Hx    Endometrial cancer Neg Hx    Prostate cancer Neg Hx      SOCIAL HISTORY:  Social Connections: Not on file   REVIEW OF SYSTEMS:  Pertinent positives include pain with urination, vulvar itching, pelvic pain, easy bruising and bleeding Denies appetite changes, fevers, chills, fatigue, unexplained weight changes. Denies hearing loss, neck lumps or masses, mouth sores, ringing in ears or voice changes. Denies cough or wheezing.   Denies chest pain or palpitations. Denies leg swelling. Denies abdominal distention, pain, blood in stools, constipation, diarrhea, nausea, vomiting, or early satiety. Denies pain with intercourse, frequency, hematuria or incontinence. Denies hot flashes, vaginal bleeding or vaginal discharge.   Denies joint pain, back pain or muscle pain/cramps. Denies itching, rash, or wounds. Denies dizziness,  headaches, numbness or seizures. Denies swollen lymph nodes or glands. Denies anxiety, depression, confusion, or decreased concentration.  Physical Exam:  Vital Signs for this encounter:  Blood pressure (!) 195/54, pulse 60, temperature (!) 97.1 F (36.2 C), temperature source Tympanic, resp. rate 18, height 5' 2"  (1.575 m), weight 186 lb (84.4 kg), SpO2 94 %. Body mass index is 34.02 kg/m. General: Alert, oriented, no acute distress.  HEENT: Normocephalic, atraumatic. Sclera anicteric.  Chest: Clear to auscultation bilaterally. No wheezes, rhonchi, or rales. Cardiovascular: Regular rate and rhythm, no murmurs, rubs, or gallops.  Abdomen: Obese. Normoactive bowel sounds. Soft, nondistended, nontender to palpation. No masses or hepatosplenomegaly appreciated. No palpable fluid wave.  Extremities: Grossly normal range of motion. Warm, well perfused. No edema bilaterally.  Skin: No rashes or lesions.  Lymphatics: No cervical, supraclavicular, or inguinal adenopathy.  GU: External female genitalia notable for ecchymosis on bilateral labia majora.  There is significant loss of architecture of the inner labia consistent with a history of lichen sclerosis.  Patient has some ulcerative, desquamative, and hyperkeratotic areas spanning from the inner right labia to the inner left labia and across the tissue between the urethra and the clitoris.  She is extremely sensitive to the touch.  Prior biopsy site with suture still in place.  Speculum exam and bimanual exam deferred given her discomfort.  LABORATORY AND RADIOLOGIC DATA:  Outside medical records were reviewed to synthesize the above history, along with the history and physical obtained during the visit.   Lab Results  Component Value Date   WBC 7.1 08/26/2019   HGB 13.6  08/26/2019   HCT 43.0 08/26/2019   PLT 155 08/26/2019   GLUCOSE 176 (H) 08/26/2019   CHOL 168 08/29/2017   TRIG 544 (H) 08/29/2017   HDL 19 (L) 08/29/2017   LDLCALC  UNABLE TO CALCULATE IF TRIGLYCERIDE OVER 400 mg/dL 08/29/2017   ALT 23 06/30/2018   AST 25 06/30/2018   NA 136 08/26/2019   K 4.7 08/26/2019   CL 100 08/26/2019   CREATININE 1.34 (H) 08/26/2019   BUN 30 (H) 08/26/2019   CO2 27 08/26/2019   TSH 4.404 08/26/2019   INR 1.78 (H) 05/02/2015   HGBA1C 7.3 (H) 09/30/2015   Pathology: A. VULVA, RIGHT, BIOPSY:  - Invasive poorly differentiated squamous cell carcinoma.  - See comment

## 2020-07-20 ENCOUNTER — Encounter: Payer: Self-pay | Admitting: Oncology

## 2020-07-20 ENCOUNTER — Other Ambulatory Visit: Payer: Self-pay

## 2020-07-20 ENCOUNTER — Ambulatory Visit: Payer: Medicare HMO | Admitting: Obstetrics & Gynecology

## 2020-07-20 ENCOUNTER — Ambulatory Visit (HOSPITAL_BASED_OUTPATIENT_CLINIC_OR_DEPARTMENT_OTHER): Payer: Medicare HMO | Admitting: Gynecologic Oncology

## 2020-07-20 ENCOUNTER — Encounter: Payer: Self-pay | Admitting: Gynecologic Oncology

## 2020-07-20 ENCOUNTER — Inpatient Hospital Stay: Payer: Medicare HMO | Attending: Gynecologic Oncology | Admitting: Gynecologic Oncology

## 2020-07-20 VITALS — BP 195/54 | HR 60 | Temp 97.1°F | Resp 18 | Ht 62.0 in | Wt 186.0 lb

## 2020-07-20 DIAGNOSIS — G4733 Obstructive sleep apnea (adult) (pediatric): Secondary | ICD-10-CM | POA: Insufficient documentation

## 2020-07-20 DIAGNOSIS — C519 Malignant neoplasm of vulva, unspecified: Secondary | ICD-10-CM

## 2020-07-20 DIAGNOSIS — E785 Hyperlipidemia, unspecified: Secondary | ICD-10-CM | POA: Diagnosis not present

## 2020-07-20 DIAGNOSIS — N183 Chronic kidney disease, stage 3 unspecified: Secondary | ICD-10-CM | POA: Diagnosis not present

## 2020-07-20 DIAGNOSIS — I4892 Unspecified atrial flutter: Secondary | ICD-10-CM | POA: Diagnosis not present

## 2020-07-20 DIAGNOSIS — R152 Fecal urgency: Secondary | ICD-10-CM | POA: Diagnosis not present

## 2020-07-20 DIAGNOSIS — Z8041 Family history of malignant neoplasm of ovary: Secondary | ICD-10-CM | POA: Insufficient documentation

## 2020-07-20 DIAGNOSIS — R3915 Urgency of urination: Secondary | ICD-10-CM | POA: Diagnosis not present

## 2020-07-20 DIAGNOSIS — Z800 Family history of malignant neoplasm of digestive organs: Secondary | ICD-10-CM | POA: Insufficient documentation

## 2020-07-20 DIAGNOSIS — Z9689 Presence of other specified functional implants: Secondary | ICD-10-CM | POA: Insufficient documentation

## 2020-07-20 DIAGNOSIS — E1122 Type 2 diabetes mellitus with diabetic chronic kidney disease: Secondary | ICD-10-CM | POA: Diagnosis not present

## 2020-07-20 DIAGNOSIS — I480 Paroxysmal atrial fibrillation: Secondary | ICD-10-CM | POA: Diagnosis not present

## 2020-07-20 DIAGNOSIS — I5032 Chronic diastolic (congestive) heart failure: Secondary | ICD-10-CM | POA: Insufficient documentation

## 2020-07-20 DIAGNOSIS — Z7901 Long term (current) use of anticoagulants: Secondary | ICD-10-CM | POA: Diagnosis not present

## 2020-07-20 DIAGNOSIS — I130 Hypertensive heart and chronic kidney disease with heart failure and stage 1 through stage 4 chronic kidney disease, or unspecified chronic kidney disease: Secondary | ICD-10-CM | POA: Diagnosis not present

## 2020-07-20 DIAGNOSIS — I2510 Atherosclerotic heart disease of native coronary artery without angina pectoris: Secondary | ICD-10-CM | POA: Diagnosis not present

## 2020-07-20 DIAGNOSIS — Z79899 Other long term (current) drug therapy: Secondary | ICD-10-CM | POA: Diagnosis not present

## 2020-07-20 NOTE — Progress Notes (Addendum)
COVID Vaccine Completed: Yes Date COVID Vaccine completed: 04/06/2019 Has received booster: No COVID vaccine manufacturer:  Moderna     Date of COVID positive in last 90 days: No  PCP - [name] Pretty, MD Cardiologist - Dr. Adrian Prows, Alethia Berthold, PA-C last office visit note 05/18/2020 in epic  Chest x-ray - 08/26/2019 in epic EKG - 05/18/2020 in epic Stress Test - greater than 2 years in epic ECHO - 11/03/2018 in epic Cardiac Cath - greater than 2 years in epic Pacemaker/ICD device last checked: requested from Dr. Irven Shelling office  Sleep Study - Yes BiPAP - Yes  Fasting Blood Sugar - 125-150 Checks Blood Sugar ___1__ times a day  Blood Thinner Instructions: Eliquis instructed to call Dr. Berline Lopes Aspirin Instructions: N/A Last Dose: N/A  Activity level:  Unable to go up a flight of stairs without symptoms    Anesthesia review: CAD,HTN, DMII, CKDIII, chronic diastolic heart failure, sick sinus syndrome status post dual-chamber pacemaker 2017, OSA on BiPAP, paroxysmal Afib and atrial flutter/atrial tachycardia.  Her last cardioversion was 09/07/2018 for atrial flutter.  Patient denies shortness of breath, fever, cough and chest pain at PAT appointment   Patient verbalized understanding of instructions that were given to them at the PAT appointment. Patient was also instructed that they will need to review over the PAT instructions again at home before surgery.

## 2020-07-20 NOTE — Progress Notes (Signed)
Requested depth of invasion on accession [id number] with [facility] via email.

## 2020-07-20 NOTE — Patient Instructions (Signed)
Preparing for your Surgery  Plan for surgery on July 26, 2020 with Dr. Jeral Pinch at [facility] will be scheduled for an examination under anesthesia with vulvar biopsies.   Pre-operative Testing -You will receive a phone call from presurgical testing at [facility] to discuss surgery instructions, arrange for an appointment if needed, and arrange for lab work if needed.  -Bring your insurance card, copy of an advanced directive if applicable, medication list.  -You can continue taking your blood thinner, Eliquis.  Day Before Surgery at [facility] will be advised you can have clear liquids up until 3 hours before your surgery.    Your role in recovery Your role is to become active as soon as directed by your doctor, while still giving yourself time to heal.  Rest when you feel tired. You will be asked to do the following in order to speed your recovery:  - Cough and breathe deeply. This helps to clear and expand your lungs and can prevent pneumonia after surgery.  - [place]. Do mild physical activity. Walking or moving your legs help your circulation and body functions return to normal. Do not try to get up or walk alone the first time after surgery.   -If you develop swelling on one leg or the other, pain in the back of your leg, redness/warmth in one of your legs, please call the office or go to the Emergency Room to have a doppler to rule out a blood clot. For shortness of breath, chest pain-seek care in the Emergency Room as soon as possible. - Actively manage your pain. Managing your pain lets you move in comfort. We will ask you to rate your pain on a scale of zero to 10. It is your responsibility to tell your doctor or nurse where and how much you hurt so your pain can be treated.  Special Considerations -Your final pathology results from surgery should be available around one week after surgery and the results will be relayed to you  when available.  Risks of Surgery Risks of surgery are low but include bleeding, infection, damage to surrounding structures, re-operation, blood clots, and very rarely death.  AFTER SURGERY INSTRUCTIONS  Activity: 1. Be up and out of the bed during the day.  Take a nap if needed.  You may walk up steps but be careful and use the hand rail.  Stair climbing will tire you more than you think, you may need to stop part way and rest.   2. No lifting or straining for 1 weeks over 10 pounds. No pushing, pulling, straining for 1 weeks.  3. No driving for minimum 24 hours after surgery if you were cleared to drive before surgery.  Do not drive if you are taking narcotic pain medicine and make sure that your reaction time has returned.   4. You can shower as soon as the next day after surgery. Shower daily. No tub baths or submerging your body in water until cleared by your surgeon.  5. No sexual activity and nothing in the vagina for 2 weeks.  6. You may experience spotting and discharge after surgery.  The spotting is normal but if you experience heavy bleeding, call our office.  7. Take Tylenol for pain.  Monitor your Tylenol intake to a max of 4,000 mg in a 24 hour period.   Diet: 1. Low sodium Heart Healthy Diet is recommended but you are cleared to resume  your normal (before surgery) diet after your procedure.  2. It is safe to use a laxative, such as Miralax or Colace, if you have difficulty moving your bowels.   Wound Care: 1. Keep clean and dry.  Shower daily.  Reasons to call the Doctor: Fever - Oral temperature greater than 100.4 degrees Fahrenheit Foul-smelling vaginal discharge Difficulty urinating Nausea and vomiting Increased pain at the site of the incision that is unrelieved with pain medicine. Difficulty breathing with or without chest pain New calf pain especially if only on one side Sudden, continuing increased vaginal bleeding with or without clots.    Contacts: For questions or concerns you should contact:  Dr. Jeral Pinch at [phone]  Joylene John, NP at [phone]  After Hours: call [phone] and have the GYN Oncologist paged/contacted (after 5 pm or on the weekends).  Messages sent via mychart are for non-urgent matters and are not responded to after hours so for urgent needs, please call the after hours number.

## 2020-07-20 NOTE — Progress Notes (Signed)
Patient here with her son for consultation with Dr. Jeral Pinch and a pre-operative discussion prior to her scheduled surgery on July 26, 2020. She is scheduled for a examination under anesthesia with vulvar biopsies.  The surgery was discussed in detail.  See after visit summary for additional details.   Discussed post-op pain management in detail including the aspects of the enhanced recovery pathway. We discussed the use of tylenol post-op and to monitor for a maximum of 4,000 mg in a 24 hour period.     Discussed the use of SCDs and measures to take at home to prevent DVT including frequent mobility.  Reportable signs and symptoms of DVT discussed. Post-operative instructions discussed and expectations for after surgery. Biopsy site care discussed.     5 minutes spent with the patient.  Verbalizing understanding of material discussed. No needs or concerns voiced at the end of the visit.   Advised patient and family to call for any needs.    This appointment is included in the global surgical fee and has no charge.

## 2020-07-20 NOTE — Patient Instructions (Signed)
Preparing for your Surgery   Plan for surgery on July 26, 2020 with Dr. Jeral Pinch at [place] will be scheduled for an examination under anesthesia with vulvar biopsies.   Pre-operative Testing -You will receive a phone call from presurgical testing at [facility] to discuss surgery instructions, arrange for an appointment if needed, and arrange for lab work if needed.   -Bring your insurance card, copy of an advanced directive if applicable, medication list.   -You can continue taking your blood thinner, Eliquis.   Day Before Surgery at [place] will be advised you can have clear liquids up until 3 hours before your surgery.     Your role in recovery Your role is to become active as soon as directed by your doctor, while still giving yourself time to heal.  Rest when you feel tired. You will be asked to do the following in order to speed your recovery:   - Cough and breathe deeply. This helps to clear and expand your lungs and can prevent pneumonia after surgery. - New Site. Do mild physical activity. Walking or moving your legs help your circulation and body functions return to normal. Do not try to get up or walk alone the first time after surgery.   -If you develop swelling on one leg or the other, pain in the back of your leg, redness/warmth in one of your legs, please call the office or go to the Emergency Room to have a doppler to rule out a blood clot. For shortness of breath, chest pain-seek care in the Emergency Room as soon as possible. - Actively manage your pain. Managing your pain lets you move in comfort. We will ask you to rate your pain on a scale of zero to 10. It is your responsibility to tell your doctor or nurse where and how much you hurt so your pain can be treated.   Special Considerations -Your final pathology results from surgery should be available around one week after surgery and the results will be relayed to  you when available.   Risks of Surgery Risks of surgery are low but include bleeding, infection, damage to surrounding structures, re-operation, blood clots, and very rarely death.   AFTER SURGERY INSTRUCTIONS   Activity: 1. Be up and out of the bed during the day.  Take a nap if needed.  You may walk up steps but be careful and use the hand rail.  Stair climbing will tire you more than you think, you may need to stop part way and rest.   2. No lifting or straining for 1 weeks over 10 pounds. No pushing, pulling, straining for 1 weeks.   3. No driving for minimum 24 hours after surgery if you were cleared to drive before surgery.  Do not drive if you are taking narcotic pain medicine and make sure that your reaction time has returned.   4. You can shower as soon as the next day after surgery. Shower daily. No tub baths or submerging your body in water until cleared by your surgeon.   5. No sexual activity and nothing in the vagina for 2 weeks.   6. You may experience spotting and discharge after surgery.  The spotting is normal but if you experience heavy bleeding, call our office.   7. Take Tylenol for pain.  Monitor your Tylenol intake to a max of 4,000 mg in a 24 hour period.   Diet: 1.  Low sodium Heart Healthy Diet is recommended but you are cleared to resume your normal (before surgery) diet after your procedure.   2. It is safe to use a laxative, such as Miralax or Colace, if you have difficulty moving your bowels.   Wound Care: 1. Keep clean and dry.  Shower daily.   Reasons to call the Doctor: Fever - Oral temperature greater than 100.4 degrees Fahrenheit Foul-smelling vaginal discharge Difficulty urinating Nausea and vomiting Increased pain at the site of the incision that is unrelieved with pain medicine. Difficulty breathing with or without chest pain New calf pain especially if only on one side Sudden, continuing increased vaginal bleeding with or without clots.    Contacts: For questions or concerns you should contact:   Dr. Jeral Pinch at [phone]   Joylene John, NP at [phone]   After Hours: call [phone] and have the GYN Oncologist paged/contacted (after 5 pm or on the weekends).   Messages sent via mychart are for non-urgent matters and are not responded to after hours so for urgent needs, please call the after hours number.

## 2020-07-23 ENCOUNTER — Encounter (HOSPITAL_COMMUNITY): Payer: Self-pay | Admitting: Gynecologic Oncology

## 2020-07-23 ENCOUNTER — Other Ambulatory Visit: Payer: Self-pay

## 2020-07-23 LAB — SURGICAL PATHOLOGY

## 2020-07-24 NOTE — Anesthesia Preprocedure Evaluation (Addendum)
Anesthesia Evaluation  Patient identified by MRN, date of birth, ID band Patient awake    Reviewed: Allergy & Precautions, NPO status , Patient's Chart, lab work & pertinent test results  Airway Mallampati: II  TM Distance: >3 FB Neck ROM: Full    Dental no notable dental hx. (+) Partial Upper,    Pulmonary sleep apnea (on Bipap) ,    Pulmonary exam normal breath sounds clear to auscultation       Cardiovascular hypertension, Pt. on medications Normal cardiovascular exam+ dysrhythmias Atrial Fibrillation + pacemaker (Dual Chamber for SSS)  Rhythm:Regular Rate:Normal     Neuro/Psych    GI/Hepatic negative GI ROS, Neg liver ROS,   Endo/Other  diabetes, Type 2  Renal/GU Renal InsufficiencyRenal disease     Musculoskeletal  (+) Arthritis ,   Abdominal (+) + obese,   Peds  Hematology   Anesthesia Other Findings   Reproductive/Obstetrics                           Anesthesia Physical Anesthesia Plan  ASA: 3  Anesthesia Plan: General   Post-op Pain Management:    Induction: Intravenous  PONV Risk Score and Plan: Ondansetron, Treatment may vary due to age or medical condition, Midazolam and Dexamethasone  Airway Management Planned: LMA  Additional Equipment: None  Intra-op Plan:   Post-operative Plan:   Informed Consent: I have reviewed the patients History and Physical, chart, labs and discussed the procedure including the risks, benefits and alternatives for the proposed anesthesia with the patient or authorized representative who has indicated his/her understanding and acceptance.     Dental advisory given  Plan Discussed with: CRNA and Anesthesiologist  Anesthesia Plan Comments: (See PAT note 07/24/2020, Konrad Felix, PA-C)      Anesthesia Quick Evaluation

## 2020-07-24 NOTE — Progress Notes (Signed)
Anesthesia Chart Review   Case: [id number] Date/Time: 07/26/20 1400   Procedures:      EXAM UNDER ANESTHESIA     VULVAR BIOPSY   Anesthesia type: General   Pre-op diagnosis: VULVAR CANCER   Location: [facility] ROOM 07 / [facility]   Surgeons: Lafonda Mosses, MD       DISCUSSION:75 y.o. never smoker with h/o HTN, DM II, OSA on BiPAP, Stage II CKD, PAF (on Eliquis), sick sinus syndrome status post dual-chamber pacemaker 1660, chronic diastolic heart failure, vulvar cancer scheduled for above procedure 07/26/2020 with Dr. Jeral Pinch.   Pt last seen by cardiology 05/18/2020. Per OV note pt stable at this visit with 3 month follow up recommended. No need to hold Eliquis per Dr. Berline Lopes.   Device orders have been requested.  VS: Ht 5' 2.5" (1.588 m)   Wt 84.4 kg   BMI 33.48 kg/m   PROVIDERS: Deland Pretty, MD is PCP   Clayton Lefort, MD is Cardiologist  LABS:  labs DOS, same day workup (all labs ordered are listed, but only abnormal results are displayed)  Labs Reviewed - No data to display   IMAGES:   EKG: 05/18/2020: Atrial paced rhythm with first-degree AV block at a rate of 60 bpm.  Left axis, left anterior fascicular block.  Poor R wave progression, cannot exclude anteroseptal infarct old.  IVCD.  CV: Echocardiogram 11/03/2018: Left ventricle cavity is normal in size. Moderate concentric hypertrophy of the left ventricle. Normal LV systolic function with EF 56%. Normal global wall motion. Unable to evaluate diastolic function due to E/A fusion. Left atrial cavity is severely dilated. Trace aortic regurgitation. Moderate (Grade III) mitral regurgitation. Mild mitral valve leaflet thickening. Moderate tricuspid regurgitation. Estimated pulmonary artery systolic pressure is 40 mmHg. No significant change compared to previous study on 07/01/2017.   Cardiac Cath 08/31/17 Conclusion: Nonobstructive coronary artery disease Mild pulmonary hypertension, likely WHO Grp II  Echo  06/05/2017 Study Conclusions   - Left ventricle: The cavity size was normal. Wall thickness was    increased in a pattern of moderate LVH. Systolic function was    vigorous. The estimated ejection fraction was in the range of 65%    to 70%. Wall motion was normal; there were no regional wall    motion abnormalities. Doppler parameters are consistent with    restrictive physiology, indicative of decreased left ventricular    diastolic compliance and/or increased left atrial pressure.  - Mitral valve: Calcified annulus. There was mild regurgitation.  - Left atrium: The atrium was severely dilated.   Impressions:   - Vigorous LV systolic function; moderate LVH; restrictive filling;    mild MR; severe LAE.  Past Medical History:  Diagnosis Date   Arthritis    Diabetes mellitus without complication ([place])    Dyspnea    Encounter for care of pacemaker 09/03/2018   History of chickenpox    Hx of psoriatic arthritis    Hyperlipemia    Hypertension    Hypertension 05/03/2018   Hypothyroidism    Mitral valve disorders(424.0)    Obstructive sleep apnea    Pacemaker    Paroxysmal atrial fibrillation ([place]) 10/11/2016   Peripheral neuropathy    Renal disorder Kidney disease stage2   Thyroid disease hypothyroid   Urine incontinence    Vitamin D deficiency     Past Surgical History:  Procedure Laterality Date   BACK SURGERY     BACK SURGERY     CARDIAC CATHETERIZATION N/A 04/29/2015  Procedure: Temporary Pacemaker;  Surgeon: Adrian Prows, MD;  Location: [place] [facility];  Service: Cardiovascular;  Laterality: N/A;   CARDIOVERSION N/A 09/07/2018   Procedure: CARDIOVERSION;  Surgeon: Adrian Prows, MD;  Location: [place];  Service: Cardiovascular;  Laterality: N/A;   CARDIOVERSION     COLONOSCOPY     EP IMPLANTABLE DEVICE N/A 04/30/2015   Procedure: Pacemaker Implant;  Surgeon: Evans Lance, MD;  Location: [place] [facility];  Service: Cardiovascular;  Laterality: N/A;    INTRAVASCULAR PRESSURE WIRE/FFR STUDY N/A 08/31/2017   Procedure: INTRAVASCULAR PRESSURE WIRE/FFR STUDY;  Surgeon: Nigel Mormon, MD;  Location: [place] [facility];  Service: Cardiovascular;  Laterality: N/A;   LEFT AND RIGHT HEART CATHETERIZATION WITH CORONARY ANGIOGRAM N/A 09/30/2011   Procedure: LEFT AND RIGHT HEART CATHETERIZATION WITH CORONARY ANGIOGRAM;  Surgeon: Laverda Page, MD;  Location: [facility] CATH LAB;  Service: Cardiovascular;  Laterality: N/A;   RIGHT/LEFT HEART CATH AND CORONARY ANGIOGRAPHY N/A 08/31/2017   Procedure: RIGHT/LEFT HEART CATH AND CORONARY ANGIOGRAPHY;  Surgeon: Nigel Mormon, MD;  Location: [place] [facility];  Service: Cardiovascular;  Laterality: N/A;   TOOTH EXTRACTION  10/07/2018   TUBAL LIGATION     YAG LASER APPLICATION Right 17/49/4496   Procedure: YAG LASER APPLICATION;  Surgeon: Elta Guadeloupe T. Gershon Crane, MD;  Location: [facility];  Service: Ophthalmology;  Laterality: Right;  pt knows to arrive at 75:91   YAG LASER APPLICATION Left 63/84/6659   Procedure: YAG LASER APPLICATION;  Surgeon: Rutherford Guys, MD;  Location: [facility];  Service: Ophthalmology;  Laterality: Left;    MEDICATIONS: No current facility-administered medications for this encounter.    amiodarone (PACERONE) 200 MG tablet   ELIQUIS 5 MG TABS tablet   EUTHYROX 88 MCG tablet   fenofibrate 160 MG tablet   insulin aspart protamine- aspart (NOVOLOG MIX 70/30) (70-30) 100 UNIT/ML injection   isosorbide mononitrate (IMDUR) 60 MG 24 hr tablet   metoprolol tartrate (LOPRESSOR) 50 MG tablet   nitroGLYCERIN (NITROSTAT) 0.4 MG SL tablet   olmesartan-hydrochlorothiazide (BENICAR HCT) 40-25 MG tablet   spironolactone (ALDACTONE) 25 MG tablet   torsemide (DEMADEX) 5 MG tablet   triamcinolone ointment (KENALOG) 0.5 %   Vibegron (GEMTESA) 75 MG TABS   Cholecalciferol (VITAMIN D-3) 1000 units CAPS   Multiple Vitamins-Minerals (HAIR/SKIN/NAILS) CAPS   nystatin cream (MYCOSTATIN)   Omega-3 Fatty Acids  (FISH OIL OMEGA-3 PO)   SURE COMFORT INSULIN SYRINGE 31G X 5/16" 0.5 ML MISC   vitamin B-12 (CYANOCOBALAMIN) 1000 MCG tablet     Konrad Felix, PA-C [facility] Pre-Surgical Testing [phone]

## 2020-07-25 ENCOUNTER — Telehealth: Payer: Self-pay

## 2020-07-25 ENCOUNTER — Telehealth: Payer: Self-pay | Admitting: *Deleted

## 2020-07-25 NOTE — Telephone Encounter (Signed)
Patient called and asked "do I need to stop my eliquis before surgery." Per [facility] APP that patient doesn't need to stop the eliquis. Patient verbalized understanding

## 2020-07-25 NOTE — Telephone Encounter (Signed)
Ms [name] states that she understands her pre-op instructions And does not have any questions or concerns at this time.

## 2020-07-26 ENCOUNTER — Encounter (HOSPITAL_COMMUNITY)
Admission: RE | Disposition: A | Discharge status: Home / Self Care | Payer: Self-pay | Source: Home / Self Care | Attending: Gynecologic Oncology

## 2020-07-26 ENCOUNTER — Telehealth: Payer: Self-pay

## 2020-07-26 ENCOUNTER — Ambulatory Visit (HOSPITAL_COMMUNITY): Payer: Medicare HMO | Admitting: Physician Assistant

## 2020-07-26 ENCOUNTER — Ambulatory Visit (HOSPITAL_COMMUNITY)
Admission: RE | Admit: 2020-07-26 | Discharge: 2020-07-26 | Disposition: A | Discharge status: Home / Self Care | Payer: Medicare HMO | Attending: Gynecologic Oncology | Admitting: Gynecologic Oncology

## 2020-07-26 ENCOUNTER — Encounter (HOSPITAL_COMMUNITY): Payer: Self-pay | Admitting: Gynecologic Oncology

## 2020-07-26 DIAGNOSIS — N183 Chronic kidney disease, stage 3 unspecified: Secondary | ICD-10-CM | POA: Insufficient documentation

## 2020-07-26 DIAGNOSIS — I5032 Chronic diastolic (congestive) heart failure: Secondary | ICD-10-CM | POA: Diagnosis not present

## 2020-07-26 DIAGNOSIS — C519 Malignant neoplasm of vulva, unspecified: Secondary | ICD-10-CM | POA: Insufficient documentation

## 2020-07-26 DIAGNOSIS — I484 Atypical atrial flutter: Secondary | ICD-10-CM | POA: Diagnosis not present

## 2020-07-26 DIAGNOSIS — Z7901 Long term (current) use of anticoagulants: Secondary | ICD-10-CM | POA: Insufficient documentation

## 2020-07-26 DIAGNOSIS — Z885 Allergy status to narcotic agent status: Secondary | ICD-10-CM | POA: Insufficient documentation

## 2020-07-26 DIAGNOSIS — Z888 Allergy status to other drugs, medicaments and biological substances status: Secondary | ICD-10-CM | POA: Diagnosis not present

## 2020-07-26 DIAGNOSIS — G4733 Obstructive sleep apnea (adult) (pediatric): Secondary | ICD-10-CM | POA: Diagnosis not present

## 2020-07-26 DIAGNOSIS — Z800 Family history of malignant neoplasm of digestive organs: Secondary | ICD-10-CM | POA: Insufficient documentation

## 2020-07-26 DIAGNOSIS — I2510 Atherosclerotic heart disease of native coronary artery without angina pectoris: Secondary | ICD-10-CM | POA: Diagnosis not present

## 2020-07-26 DIAGNOSIS — E1142 Type 2 diabetes mellitus with diabetic polyneuropathy: Secondary | ICD-10-CM | POA: Insufficient documentation

## 2020-07-26 DIAGNOSIS — Z794 Long term (current) use of insulin: Secondary | ICD-10-CM | POA: Insufficient documentation

## 2020-07-26 DIAGNOSIS — Z79890 Hormone replacement therapy (postmenopausal): Secondary | ICD-10-CM | POA: Insufficient documentation

## 2020-07-26 DIAGNOSIS — I130 Hypertensive heart and chronic kidney disease with heart failure and stage 1 through stage 4 chronic kidney disease, or unspecified chronic kidney disease: Secondary | ICD-10-CM | POA: Insufficient documentation

## 2020-07-26 DIAGNOSIS — E1122 Type 2 diabetes mellitus with diabetic chronic kidney disease: Secondary | ICD-10-CM | POA: Insufficient documentation

## 2020-07-26 DIAGNOSIS — Z79899 Other long term (current) drug therapy: Secondary | ICD-10-CM | POA: Diagnosis not present

## 2020-07-26 DIAGNOSIS — Z950 Presence of cardiac pacemaker: Secondary | ICD-10-CM | POA: Diagnosis not present

## 2020-07-26 DIAGNOSIS — I480 Paroxysmal atrial fibrillation: Secondary | ICD-10-CM | POA: Insufficient documentation

## 2020-07-26 DIAGNOSIS — E559 Vitamin D deficiency, unspecified: Secondary | ICD-10-CM | POA: Diagnosis not present

## 2020-07-26 DIAGNOSIS — Z8041 Family history of malignant neoplasm of ovary: Secondary | ICD-10-CM | POA: Diagnosis not present

## 2020-07-26 HISTORY — DX: Hypothyroidism, unspecified: E03.9

## 2020-07-26 HISTORY — PX: VULVA /PERINEUM BIOPSY: SHX319

## 2020-07-26 LAB — BASIC METABOLIC PANEL
Anion gap: 7 (ref 5–15)
BUN: 22 mg/dL (ref 8–23)
CO2: 27 mmol/L (ref 22–32)
Calcium: 10.3 mg/dL (ref 8.9–10.3)
Chloride: 105 mmol/L (ref 98–111)
Creatinine, Ser: 1.12 mg/dL — ABNORMAL HIGH (ref 0.44–1.00)
GFR, Estimated: 51 mL/min — ABNORMAL LOW (ref >60)
Glucose, Bld: 158 mg/dL — ABNORMAL HIGH (ref 70–99)
Potassium: 4 mmol/L (ref 3.5–5.1)
Sodium: 139 mmol/L (ref 135–145)

## 2020-07-26 LAB — HEMOGLOBIN A1C
Hgb A1c MFr Bld: 7.8 % — ABNORMAL HIGH (ref 4.8–5.6)
Mean Plasma Glucose: 177.16 mg/dL

## 2020-07-26 LAB — CBC
HCT: 41.7 % (ref 36.0–46.0)
Hemoglobin: 13.4 g/dL (ref 12.0–15.0)
MCH: 29.8 pg (ref 26.0–34.0)
MCHC: 32.1 g/dL (ref 30.0–36.0)
MCV: 92.7 fL (ref 80.0–100.0)
Platelets: 148 10*3/uL — ABNORMAL LOW (ref 150–400)
RBC: 4.5 MIL/uL (ref 3.87–5.11)
RDW: 13.2 % (ref 11.5–15.5)
WBC: 7.9 10*3/uL (ref 4.0–10.5)
nRBC: 0 % (ref 0.0–0.2)

## 2020-07-26 LAB — GLUCOSE, CAPILLARY: Glucose-Capillary: 140 mg/dL — ABNORMAL HIGH (ref 70–99)

## 2020-07-26 SURGERY — EXAM UNDER ANESTHESIA
Anesthesia: General | Site: Perineum

## 2020-07-26 MED ORDER — BUPIVACAINE HCL 0.25 % IJ SOLN
INTRAMUSCULAR | Status: DC | PRN
  Administered 2020-07-26: 10 mL

## 2020-07-26 MED ORDER — FENTANYL CITRATE (PF) 100 MCG/2ML IJ SOLN
INTRAMUSCULAR | Status: AC
  Filled 2020-07-26: qty 2

## 2020-07-26 MED ORDER — CHLORHEXIDINE GLUCONATE 0.12 % MT SOLN
15.0000 mL | Freq: Once | OROMUCOSAL | Status: AC
  Administered 2020-07-26: 15 mL via OROMUCOSAL

## 2020-07-26 MED ORDER — ONDANSETRON HCL 4 MG/2ML IJ SOLN
INTRAMUSCULAR | Status: AC
  Filled 2020-07-26: qty 2

## 2020-07-26 MED ORDER — ACETAMINOPHEN 500 MG PO TABS
1000.0000 mg | ORAL_TABLET | ORAL | Status: AC
Start: 2020-07-26 — End: 2020-07-26
  Administered 2020-07-26: 1000 mg via ORAL
  Filled 2020-07-26: qty 2

## 2020-07-26 MED ORDER — FENTANYL CITRATE (PF) 100 MCG/2ML IJ SOLN
INTRAMUSCULAR | Status: AC
  Administered 2020-07-26: 25 ug via INTRAVENOUS
  Filled 2020-07-26: qty 2

## 2020-07-26 MED ORDER — FENTANYL CITRATE (PF) 100 MCG/2ML IJ SOLN: 25.0000 ug | INTRAMUSCULAR | Status: DC | PRN

## 2020-07-26 MED ORDER — ONDANSETRON HCL 4 MG/2ML IJ SOLN: 4.0000 mg | Freq: Once | INTRAMUSCULAR | Status: DC | PRN

## 2020-07-26 MED ORDER — PROPOFOL 10 MG/ML IV BOLUS
INTRAVENOUS | Status: AC
  Filled 2020-07-26: qty 20

## 2020-07-26 MED ORDER — LIDOCAINE 2% (20 MG/ML) 5 ML SYRINGE
INTRAMUSCULAR | Status: AC
  Filled 2020-07-26: qty 5

## 2020-07-26 MED ORDER — DEXAMETHASONE SODIUM PHOSPHATE 10 MG/ML IJ SOLN
INTRAMUSCULAR | Status: AC
  Filled 2020-07-26: qty 1

## 2020-07-26 MED ORDER — ONDANSETRON HCL 4 MG/2ML IJ SOLN
INTRAMUSCULAR | Status: DC | PRN
  Administered 2020-07-26: 4 mg via INTRAVENOUS

## 2020-07-26 MED ORDER — FENTANYL CITRATE (PF) 100 MCG/2ML IJ SOLN
INTRAMUSCULAR | Status: DC | PRN
  Administered 2020-07-26: 50 ug via INTRAVENOUS

## 2020-07-26 MED ORDER — LACTATED RINGERS IV SOLN: INTRAVENOUS | Status: DC

## 2020-07-26 MED ORDER — DEXAMETHASONE SODIUM PHOSPHATE 4 MG/ML IJ SOLN
INTRAMUSCULAR | Status: DC | PRN
  Administered 2020-07-26: 5 mg via INTRAVENOUS

## 2020-07-26 MED ORDER — ORAL CARE MOUTH RINSE: 15.0000 mL | Freq: Once | OROMUCOSAL | Status: AC

## 2020-07-26 MED ORDER — ACETAMINOPHEN 10 MG/ML IV SOLN: 1000.0000 mg | Freq: Once | INTRAVENOUS | Status: DC | PRN

## 2020-07-26 MED ORDER — LIDOCAINE 5 % EX OINT: 1.0000 "application " | TOPICAL_OINTMENT | Freq: Three times a day (TID) | CUTANEOUS | 2 refills | Status: DC | PRN

## 2020-07-26 SURGICAL SUPPLY — 34 items
BACTOSHIELD CHG 4% 4OZ (MISCELLANEOUS) ×1
BAG COUNTER SPONGE SURGICOUNT (BAG) IMPLANT
BAG SPNG CNTER NS LX DISP (BAG)
BLADE SURG 15 STRL LF DISP TIS (BLADE) ×2 IMPLANT
BLADE SURG 15 STRL SS (BLADE) ×4
BNDG GAUZE ELAST 4 BULKY (GAUZE/BANDAGES/DRESSINGS) IMPLANT
COVER SURGICAL LIGHT HANDLE (MISCELLANEOUS) ×2 IMPLANT
DRAPE SHEET LG 3/4 BI-LAMINATE (DRAPES) ×2 IMPLANT
DRSG PAD ABDOMINAL 8X10 ST (GAUZE/BANDAGES/DRESSINGS) IMPLANT
GLOVE SURG ENC MOIS LTX SZ6 (GLOVE) ×4 IMPLANT
GLOVE SURG ENC MOIS LTX SZ6.5 (GLOVE) ×2 IMPLANT
GOWN STRL REUS W/ TWL LRG LVL3 (GOWN DISPOSABLE) ×2 IMPLANT
GOWN STRL REUS W/TWL LRG LVL3 (GOWN DISPOSABLE) ×4
KIT BASIN OR (CUSTOM PROCEDURE TRAY) ×2 IMPLANT
KIT TURNOVER KIT A (KITS) ×2 IMPLANT
NDL HYPO 25X1 1.5 SAFETY (NEEDLE) ×1 IMPLANT
NEEDLE HYPO 25X1 1.5 SAFETY (NEEDLE) ×2 IMPLANT
PACK LITHOTOMY IV (CUSTOM PROCEDURE TRAY) ×2 IMPLANT
PACK PERINEAL COLD (PAD) ×2 IMPLANT
PAD TELFA 2X3 NADH STRL (GAUZE/BANDAGES/DRESSINGS) IMPLANT
PANTS MESH DISP LRG (UNDERPADS AND DIAPERS) ×1 IMPLANT
PANTS MESH DISPOSABLE L (UNDERPADS AND DIAPERS) ×1
PENCIL SMOKE EVACUATOR (MISCELLANEOUS) IMPLANT
SCRUB CHG 4% DYNA-HEX 4OZ (MISCELLANEOUS) ×1 IMPLANT
SPONGE T-LAP 18X18 ~~LOC~~+RFID (SPONGE) IMPLANT
SURGILUBE 2OZ TUBE FLIPTOP (MISCELLANEOUS) ×1 IMPLANT
SUT VIC AB 2-0 SH 27 (SUTURE)
SUT VIC AB 2-0 SH 27X BRD (SUTURE) ×4 IMPLANT
SUT VIC AB 3-0 SH 27 (SUTURE)
SUT VIC AB 3-0 SH 27X BRD (SUTURE) ×2 IMPLANT
SUT VIC AB 4-0 PS2 27 (SUTURE) ×2 IMPLANT
SUT VIC AB 4-0 SH 18 (SUTURE) ×2 IMPLANT
SYR CONTROL 10ML LL (SYRINGE) ×2 IMPLANT
TRAY FOLEY MTR SLVR 16FR STAT (SET/KITS/TRAYS/PACK) ×1 IMPLANT

## 2020-07-26 NOTE — Anesthesia Procedure Notes (Signed)
Procedure Name: LMA Insertion Date/Time: 07/26/2020 1:00 PM Performed by: Claudia Desanctis, CRNA Pre-anesthesia Checklist: Emergency Drugs available, Patient identified, Suction available and Patient being monitored Patient Re-evaluated:Patient Re-evaluated prior to induction Oxygen Delivery Method: Circle system utilized Preoxygenation: Pre-oxygenation with 100% oxygen Induction Type: IV induction Ventilation: Mask ventilation without difficulty LMA: LMA inserted LMA Size: 4.0 Number of attempts: 1 Placement Confirmation: positive ETCO2 and breath sounds checked- equal and bilateral Tube secured with: Tape Dental Injury: Teeth and Oropharynx as per pre-operative assessment

## 2020-07-26 NOTE — Interval H&P Note (Signed)
History and Physical Interval Note:  07/26/2020 12:18 PM  [name]  has presented today for surgery, with the diagnosis of VULVAR CANCER.  The various methods of treatment have been discussed with the patient and family. After consideration of risks, benefits and other options for treatment, the patient has consented to  Procedure(s): EXAM UNDER ANESTHESIA (N/A) VULVAR BIOPSY (N/A) as a surgical intervention.  The patient's history has been reviewed, patient examined, no change in status, stable for surgery.  I have reviewed the patient's chart and labs.  Questions were answered to the patient's satisfaction.     Lafonda Mosses

## 2020-07-26 NOTE — Telephone Encounter (Signed)
Told Ms Elrod that the lidocaine requires a prior auth with her insurance company.  Called [facility] and the out of pocket cost was $208.00. No alternate medications given with Prior auth. Pt had surgery today and may need medication this evening or in am. Requested pharmacist to see what a good RX card would due to the cost of medication.  It brought it to $21.19.  Pt comfortable with this cost and had pharmacy fill med with Good Rx price.

## 2020-07-26 NOTE — Transfer of Care (Signed)
Immediate Anesthesia Transfer of Care Note  Patient: [name]  Procedure(s) Performed: EXAM UNDER ANESTHESIA (Perineum) VULVAR BIOPSY (Perineum)  Patient Location: PACU  Anesthesia Type:General  Level of Consciousness: awake and patient cooperative  Airway & Oxygen Therapy: Patient Spontanous Breathing and Patient connected to face mask  Post-op Assessment: Report given to RN and Post -op Vital signs reviewed and stable  Post vital signs: Reviewed and stable  Last Vitals:  Vitals Value Taken Time  BP 133/50 07/26/20 1324  Temp    Pulse 59 07/26/20 1325  Resp 15 07/26/20 1325  SpO2 100 % 07/26/20 1325  Vitals shown include unvalidated device data.  Last Pain:  Vitals:   07/26/20 1100  TempSrc:   PainSc: 0-No pain         Complications: No notable events documented.

## 2020-07-26 NOTE — Discharge Instructions (Signed)
07/26/2020  You can restart your Eliquis either tonight (7/14) or tomorrow.   Activity: 1. Be up and out of the bed during the day.  Take a nap if needed.  You may walk up steps but be careful and use the hand rail.  Stair climbing will tire you more than you think, you may need to stop part way and rest.   2. No lifting or straining for 2 weeks.  3. You can bathe normally.  Medications:  - Take ibuprofen and tylenol first line for pain control. Take these regularly (every 6 hours) to decrease the build up of pain.  - I sent a prescription for topical numbing medicine (lidocaine) to use as needed.  Diet: 1. Low sodium Heart Healthy Diet is recommended.  2. It is safe to use a laxative if you have difficulty moving your bowels.   Wound Care: 1. Keep clean and dry.  Shower daily.  Reasons to call the Doctor:  Fever - Oral temperature greater than 100.4 degrees Fahrenheit Foul-smelling vaginal discharge Difficulty urinating Nausea and vomiting Increased pain at the site of the incision that is unrelieved with pain medicine. Difficulty breathing with or without chest pain New calf pain especially if only on one side Sudden, continuing increased vaginal bleeding with or without clots.   Follow-up: 1. You will have a phone visit with Dr. Berline Lopes once results are back, sometime next week.  Contacts: For questions or concerns you should contact:  Dr. Jeral Pinch at [phone] After hours and on week-ends call [phone] and ask to speak to the physician on call for Gynecologic Oncology

## 2020-07-26 NOTE — Op Note (Signed)
PATIENT: [name] DATE: 07/26/2020  Preop Diagnosis: Squamous cell carcinoma of the vulva  Postoperative Diagnosis: Same as above  Surgery: Exam under anesthesia, vulvar biopsies  Surgeons:  Valarie Cones, MD  Assistant: none  Anesthesia: General   Estimated blood loss: 5 ml  IVF: See I&O flowsheet  Urine output: n/a   Complications: None apparent  Pathology: vulvar biopsies  Operative findings: Pictures taken in media.  On exam, there is about a 4 x 3-1/2 cm area concerning for involvement by carcinoma that spans between the clitoris (replacing the bottom aspect of the clitoris) to just superior to the urethra.  This is a butterfly lesion that extends along the inner labia bilaterally.  Previous biopsy site is healing well with suture still intact.  There is a firmer and raised area that is approximately 1 cm around the area of the biopsy.  Biopsies taken circumferentially around the lesion noted as well as from the central aspect to help delineate what is cancer and what may be chronic dermatoses.  Given appearance today with patient more comfortable, I am suspicious that the entire lesion is carcinoma.  Procedure: The patient was identified in the preoperative holding area. Informed consent was signed on the chart. Patient was seen history was reviewed and exam was performed.   The patient was then taken to the operating room and placed in the supine position with SCD hose on. General anesthesia was then induced without difficulty. She was then placed in the dorsolithotomy position.   Timeout was performed the patient, procedure, antibiotic, allergy, and length of procedure.   Perineum was prepped with betadine.   Exam under anesthesia was performed with findings as noted above.  Multiple biopsies were taken with Tischler forceps at 6:00, 11:00, 3:00, and from the central aspect of the lesion.  The specimens were collected on a telfa and sent for permanent  pathology. Pictures were also taken, including with a ruler for measurements.  The lesion was noted to be replacing at least a portion of the clitoris and coming with in 1-2 mm the urethral meatus.  Monopolar electrocautery was used to achieve hemostasis of the biopsy sites.  Approximately 10 cc of quarter percent Marcaine was injected for local anesthesia.  Patient's vulva was then cleaned and dried.  All instrument, suture, laparotomy, Ray-Tec, and needle counts were correct x2. The patient tolerated the procedure well and was taken recovery room in stable condition.   Jeral Pinch MD Gynecologic Oncology

## 2020-07-27 ENCOUNTER — Encounter (HOSPITAL_COMMUNITY): Payer: Self-pay | Admitting: Gynecologic Oncology

## 2020-07-27 ENCOUNTER — Telehealth: Payer: Self-pay | Admitting: *Deleted

## 2020-07-27 ENCOUNTER — Telehealth: Payer: Self-pay

## 2020-07-27 LAB — SURGICAL PATHOLOGY

## 2020-07-27 NOTE — Telephone Encounter (Signed)
Spoke with the patient and scheduled a genetics appt

## 2020-07-27 NOTE — Anesthesia Postprocedure Evaluation (Signed)
Anesthesia Post Note  Patient: [name]  Procedure(s) Performed: EXAM UNDER ANESTHESIA (Perineum) VULVAR BIOPSY (Perineum)     Patient location during evaluation: PACU Anesthesia Type: General Level of consciousness: awake and alert Pain management: pain level controlled Vital Signs Assessment: post-procedure vital signs reviewed and stable Respiratory status: spontaneous breathing, nonlabored ventilation, respiratory function stable and patient connected to nasal cannula oxygen Cardiovascular status: blood pressure returned to baseline and stable Postop Assessment: no apparent nausea or vomiting Anesthetic complications: no   No notable events documented.  Last Vitals:  Vitals:   07/26/20 1415 07/26/20 1430  BP: (!) 167/71 (!) 156/59  Pulse: 60 60  Resp: 18 17  Temp:  36.6 C  SpO2: 92% 92%    Last Pain:  Vitals:   07/26/20 1430  TempSrc:   PainSc: 0-No pain                 Barnet Glasgow

## 2020-07-27 NOTE — Telephone Encounter (Signed)
Spoke with [name] this morning. She states she is eating, drinking and urinating well. She has not had a BM yet. Not passing gas. Told her to take a Capful of Miralax this morning. May use bid prn if no bm. Encouraged her to drink plenty of water. She denies fever or chills. Biopsy sites draining some serosanguinous fluid.. She is not in any pain.  Instructed to call office with any fever, chills, purulent drainage, uncontrolled pain or any other questions or concerns. Patient verbalizes understanding.  Pt aware of post op appointments as well as the office number [phone] and after hours number [phone] to call if she has any questions or concerns

## 2020-07-30 ENCOUNTER — Telehealth: Payer: Self-pay | Admitting: Gynecologic Oncology

## 2020-07-30 NOTE — Telephone Encounter (Signed)
Called patient, discussed biopsy results. Plan to discuss case at tumor board next Monday, PET scan Monday.   I am concerned given tumor proximity to the urethra and even with resection of distal urethra achieving negative margins.  Patient is overall feeling very well after surgery.  She had no pain for several days and this morning has had only slight burning.  The plan to have a phone visit next Tuesday at 3 PM to discuss tumor board recommendations and PET scan results.  Jeral Pinch MD Gynecologic Oncology

## 2020-07-31 ENCOUNTER — Other Ambulatory Visit: Payer: Self-pay | Admitting: Cardiology

## 2020-08-01 ENCOUNTER — Ambulatory Visit: Payer: Medicare HMO | Admitting: Gynecologic Oncology

## 2020-08-01 ENCOUNTER — Other Ambulatory Visit: Payer: Self-pay

## 2020-08-01 DIAGNOSIS — I5033 Acute on chronic diastolic (congestive) heart failure: Secondary | ICD-10-CM | POA: Diagnosis not present

## 2020-08-01 DIAGNOSIS — R069 Unspecified abnormalities of breathing: Secondary | ICD-10-CM | POA: Diagnosis not present

## 2020-08-01 MED ORDER — OLMESARTAN MEDOXOMIL-HCTZ 40-25 MG PO TABS: 1.0000 | ORAL_TABLET | Freq: Every day | ORAL | 3 refills | Status: DC

## 2020-08-06 ENCOUNTER — Other Ambulatory Visit: Payer: Self-pay | Admitting: Oncology

## 2020-08-06 ENCOUNTER — Ambulatory Visit (HOSPITAL_COMMUNITY)
Admission: RE | Admit: 2020-08-06 | Discharge: 2020-08-06 | Disposition: A | Discharge status: Home / Self Care | Payer: Medicare HMO | Source: Ambulatory Visit | Attending: Gynecologic Oncology | Admitting: Gynecologic Oncology

## 2020-08-06 ENCOUNTER — Other Ambulatory Visit: Payer: Self-pay

## 2020-08-06 DIAGNOSIS — Z8673 Personal history of transient ischemic attack (TIA), and cerebral infarction without residual deficits: Secondary | ICD-10-CM | POA: Diagnosis not present

## 2020-08-06 DIAGNOSIS — I517 Cardiomegaly: Secondary | ICD-10-CM | POA: Diagnosis not present

## 2020-08-06 DIAGNOSIS — C519 Malignant neoplasm of vulva, unspecified: Secondary | ICD-10-CM

## 2020-08-06 DIAGNOSIS — K5730 Diverticulosis of large intestine without perforation or abscess without bleeding: Secondary | ICD-10-CM | POA: Insufficient documentation

## 2020-08-06 DIAGNOSIS — K8020 Calculus of gallbladder without cholecystitis without obstruction: Secondary | ICD-10-CM | POA: Diagnosis not present

## 2020-08-06 DIAGNOSIS — I700 Atherosclerosis of aorta: Secondary | ICD-10-CM | POA: Insufficient documentation

## 2020-08-06 DIAGNOSIS — I2510 Atherosclerotic heart disease of native coronary artery without angina pectoris: Secondary | ICD-10-CM | POA: Diagnosis not present

## 2020-08-06 LAB — GLUCOSE, CAPILLARY: Glucose-Capillary: 120 mg/dL — ABNORMAL HIGH (ref 70–99)

## 2020-08-06 MED ORDER — FLUDEOXYGLUCOSE F - 18 (FDG) INJECTION
9.6000 | Freq: Once | INTRAVENOUS | Status: AC | PRN
  Administered 2020-08-06: 9.31 via INTRAVENOUS

## 2020-08-06 NOTE — Progress Notes (Signed)
Gynecologic Oncology Multi-Disciplinary Disposition Conference Note  Date of the Conference: 08/06/2020  Patient Name: [name]  Referring Provider: Dr. Elonda Husky Primary GYN Oncologist: Dr. Berline Lopes  Stage/Disposition:  At least stage 1 squamous cell carcinoma of the vulva. Disposition is for a PET scan and if no obvious lymphadenopathy then consideration for surgery at [facility] with a partial radical vulvectomy and bilateral sentinel lymph node biopsy followed by radiation therapy.   This Multidisciplinary conference took place involving physicians from [place], [place], Radiation Oncology, Pathology, Radiology along with the Gynecologic Oncology Nurse Practitioner and RN.  Comprehensive assessment of the patient's malignancy, staging, need for surgery, chemotherapy, radiation therapy, and need for further testing were reviewed. Supportive measures, both inpatient and following discharge were also discussed. The recommended plan of care is documented. Greater than 35 minutes were spent correlating and coordinating this patient's care.

## 2020-08-07 ENCOUNTER — Inpatient Hospital Stay (HOSPITAL_BASED_OUTPATIENT_CLINIC_OR_DEPARTMENT_OTHER): Payer: Medicare HMO | Admitting: Gynecologic Oncology

## 2020-08-07 ENCOUNTER — Encounter: Payer: Self-pay | Admitting: Gynecologic Oncology

## 2020-08-07 DIAGNOSIS — C519 Malignant neoplasm of vulva, unspecified: Secondary | ICD-10-CM

## 2020-08-07 DIAGNOSIS — Z7189 Other specified counseling: Secondary | ICD-10-CM

## 2020-08-07 NOTE — Progress Notes (Signed)
Gynecologic Oncology Telehealth Consult Note: Gyn-Onc  I connected with [name] on 08/07/20 at  3:15 PM EDT by telephone and verified that I am speaking with the correct person using two identifiers.  I discussed the limitations, risks, security and privacy concerns of performing an evaluation and management service by telemedicine and the availability of in-person appointments. I also discussed with the patient that there may be a patient responsible charge related to this service. The patient expressed understanding and agreed to proceed.  Other persons participating in the visit and their role in the encounter: None.  Patient's location: Home Provider's location: [facility]  Reason for Visit: Follow-up PET scan  Treatment History: Oncology History  Vulvar cancer ([place])  07/20/2020 Initial Diagnosis   Vulvar cancer ([place])   07/26/2020 Surgery   EUA, vulvar biopsies  Findings: Pictures taken in media.  On exam, there is about a 4 x 3-1/2 cm area concerning for involvement by carcinoma that spans between the clitoris (replacing the bottom aspect of the clitoris) to just superior to the urethra.  This is a butterfly lesion that extends along the inner labia bilaterally.  Previous biopsy site is healing well with suture still intact.  There is a firmer and raised area that is approximately 1 cm around the area of the biopsy.  Biopsies taken circumferentially around the lesion noted as well as from the central aspect to help delineate what is cancer and what may be chronic dermatoses.  Given appearance today with patient more comfortable, I am suspicious that the entire lesion is carcinoma.   07/26/2020 Pathology Results   A. VULVA, 6 OCLOCK, INFERIOR ASPECT, BIOPSY:  - Superficial fragments of squamous cell carcinoma.   B. VULVA, 11 OCLOCK, BIOPSY:  - Invasive squamous cell carcinoma.  - No lymphovascular invasion.   C. VULVA, 2 OCLOCK, BIOPSY:  - Invasive squamous  cell carcinoma.  - No lymphovascular invasion.   D. VULVA, CENTRAL, BIOPSY:  - Superficial fragment of squamous cell carcinoma.    08/06/2020 Imaging   PET: no evidence of metastatic disease   08/07/2020 Clinical Stage   Stage IB     Interval History: The patient reports doing well since her procedure with me.  She is continuing to have some vulvar burning which is relieved with use of the lidocaine cream.  She denies any significant vaginal or vulvar bleeding, discharge.  Reports regular bowel and bladder function.  Has some occasional burning with urination over the area on her vulva.  Past Medical/Surgical History: Past Medical History:  Diagnosis Date   Arthritis    Diabetes mellitus without complication ([place])    Dyspnea    Encounter for care of pacemaker 09/03/2018   History of chickenpox    Hx of psoriatic arthritis    Hyperlipemia    Hypertension    Hypertension 05/03/2018   Hypothyroidism    Mitral valve disorders(424.0)    Obstructive sleep apnea    Pacemaker    Paroxysmal atrial fibrillation ([place]) 10/11/2016   Peripheral neuropathy    Renal disorder Kidney disease stage2   Thyroid disease hypothyroid   Urine incontinence    Vitamin D deficiency     Past Surgical History:  Procedure Laterality Date   BACK SURGERY     BACK SURGERY     CARDIAC CATHETERIZATION N/A 04/29/2015   Procedure: Temporary Pacemaker;  Surgeon: Adrian Prows, MD;  Location: [facility];  Service: Cardiovascular;  Laterality: N/A;   CARDIOVERSION N/A 09/07/2018  Procedure: CARDIOVERSION;  Surgeon: Adrian Prows, MD;  Location: [facility];  Service: Cardiovascular;  Laterality: N/A;   CARDIOVERSION     COLONOSCOPY     EP IMPLANTABLE DEVICE N/A 04/30/2015   Procedure: Pacemaker Implant;  Surgeon: Evans Lance, MD;  Location: [facility];  Service: Cardiovascular;  Laterality: N/A;   INTRAVASCULAR PRESSURE WIRE/FFR STUDY N/A 08/31/2017   Procedure: INTRAVASCULAR PRESSURE  WIRE/FFR STUDY;  Surgeon: Nigel Mormon, MD;  Location: [facility];  Service: Cardiovascular;  Laterality: N/A;   LEFT AND RIGHT HEART CATHETERIZATION WITH CORONARY ANGIOGRAM N/A 09/30/2011   Procedure: LEFT AND RIGHT HEART CATHETERIZATION WITH CORONARY ANGIOGRAM;  Surgeon: Laverda Page, MD;  Location: [facility];  Service: Cardiovascular;  Laterality: N/A;   RIGHT/LEFT HEART CATH AND CORONARY ANGIOGRAPHY N/A 08/31/2017   Procedure: RIGHT/LEFT HEART CATH AND CORONARY ANGIOGRAPHY;  Surgeon: Nigel Mormon, MD;  Location: [facility];  Service: Cardiovascular;  Laterality: N/A;   TOOTH EXTRACTION  10/07/2018   TUBAL LIGATION     VULVA Milagros Loll BIOPSY N/A 07/26/2020   Procedure: VULVAR BIOPSY;  Surgeon: Lafonda Mosses, MD;  Location: [facility];  Service: Gynecology;  Laterality: N/A;   YAG LASER APPLICATION Right [id number]   Procedure: YAG LASER APPLICATION;  Surgeon: Elta Guadeloupe T. Gershon Crane, MD;  Location: [facility];  Service: Ophthalmology;  Laterality: Right;  pt knows to arrive at [id number]   YAG LASER APPLICATION Left [id number]   Procedure: YAG LASER APPLICATION;  Surgeon: Rutherford Guys, MD;  Location: [facility];  Service: Ophthalmology;  Laterality: Left;    Family History  Problem Relation Age of Onset   Arthritis Mother    Diabetes Mother    Heart disease Mother    Hyperlipidemia Mother    Hypertension Mother    Arthritis Father    Asthma Father    Heart attack Father    Hyperlipidemia Father    Hypertension Father    Stroke Father    Arthritis Sister    Diabetes Sister    Hypertension Sister    Arthritis Sister    Cancer Sister    Diabetes Sister    Hyperlipidemia Sister    Hypertension Sister    Stroke Sister    Ovarian cancer Sister    Pancreatic cancer Sister    Cancer Sister    Hyperlipidemia Sister    Hypertension Sister    Arthritis Brother    Heart attack Brother    Heart disease Brother    Hyperlipidemia Brother    Hypertension Brother     Alcohol abuse Brother    Arthritis Brother    Diabetes Brother    Early death Brother    Heart disease Brother    Hyperlipidemia Brother    Hypertension Brother    Hypertension Daughter    Colon cancer Neg Hx    Breast cancer Neg Hx    Endometrial cancer Neg Hx    Prostate cancer Neg Hx     Social History   Socioeconomic History   Marital status: Widowed    Spouse name: Not on file   Number of children: 3   Years of education: Not on file   Highest education level: Not on file  Occupational History   Not on file  Tobacco Use   Smoking status: Never   Smokeless tobacco: Never  Vaping Use   Vaping Use: Never used  Substance and Sexual Activity   Alcohol use: No    Alcohol/week: 0.0  standard drinks   Drug use: No   Sexual activity: Not Currently    Birth control/protection: Post-menopausal  Other Topics Concern   Not on file  Social History Narrative   Husband recently passed away on May 22, 2020.   Social Determinants of Health   Financial Resource Strain: Not on file  Food Insecurity: Not on file  Transportation Needs: Not on file  Physical Activity: Not on file  Stress: Not on file  Social Connections: Not on file    Current Medications:  Current Outpatient Medications:    amiodarone (PACERONE) 200 MG tablet, Take 1/2 (one-half) tablet by mouth once daily (Patient taking differently: Take 100 mg by mouth daily.), Disp: 45 tablet, Rfl: 3   Cholecalciferol (VITAMIN D-3) 1000 units CAPS, Take 2,000 Units by mouth daily., Disp: , Rfl:    ELIQUIS 5 MG TABS tablet, Take 1 tablet by mouth twice daily, Disp: 180 tablet, Rfl: 0   EUTHYROX 88 MCG tablet, Take 88 mcg by mouth daily., Disp: , Rfl:    fenofibrate 160 MG tablet, Take 160 mg by mouth daily with supper. , Disp: , Rfl:    insulin aspart protamine- aspart (NOVOLOG MIX 70/30) (70-30) 100 UNIT/ML injection, Inject 35-45 Units into the skin See admin instructions. Inject 45 units into the skin with  breakfast, 35 units with supper (may adjust based on blood sugar readings), Disp: , Rfl:    isosorbide mononitrate (IMDUR) 60 MG 24 hr tablet, Take 1 tablet (60 mg total) by mouth daily., Disp: 90 tablet, Rfl: 3   lidocaine (XYLOCAINE) 5 % ointment, Apply 1 application topically 3 (three) times daily as needed., Disp: 35.44 g, Rfl: 2   metoprolol tartrate (LOPRESSOR) 50 MG tablet, Take 1 tablet (50 mg total) by mouth 2 (two) times daily., Disp: 180 tablet, Rfl: 3   Multiple Vitamins-Minerals (HAIR/SKIN/NAILS) CAPS, Take 1 tablet by mouth daily., Disp: , Rfl:    nitroGLYCERIN (NITROSTAT) 0.4 MG SL tablet, Place 1 tablet (0.4 mg total) under the tongue every 5 (five) minutes as needed for chest pain., Disp: 25 tablet, Rfl: 1   nystatin cream (MYCOSTATIN), APPLY  CREAM TOPICALLY TWICE DAILY AS NEEDED FOR  IRRIATATION  OF  AFFECTED  AREA, Disp: 30 g, Rfl: 0   olmesartan-hydrochlorothiazide (BENICAR HCT) 40-25 MG tablet, Take 1 tablet by mouth daily., Disp: 90 tablet, Rfl: 3   Omega-3 Fatty Acids (FISH OIL OMEGA-3 PO), Take by mouth daily., Disp: , Rfl:    spironolactone (ALDACTONE) 25 MG tablet, Take 1 tablet by mouth once daily (Patient taking differently: Take 25 mg by mouth daily.), Disp: 90 tablet, Rfl: 0   SURE COMFORT INSULIN SYRINGE 31G X 5/16" 0.5 ML MISC, , Disp: , Rfl:    torsemide (DEMADEX) 5 MG tablet, Take 5 mg by mouth daily as needed (swelling)., Disp: , Rfl:    triamcinolone ointment (KENALOG) 0.5 %, Apply topically 2 (two) times daily. (Patient taking differently: Apply 1 application topically 2 (two) times daily.), Disp: 30 g, Rfl: 3   Vibegron (GEMTESA) 75 MG TABS, Take 75 mg by mouth daily., Disp: , Rfl:    vitamin B-12 (CYANOCOBALAMIN) 1000 MCG tablet, Take 2,000 mcg by mouth daily., Disp: , Rfl:   Review of Symptoms: Complete 10-system review is negative except as above in Interval History.  Physical Exam: There were no vitals taken for this visit. Deferred given limitations of  phone visit  Laboratory & Radiologic Studies: PET scan from yesterday shows no obvious evidence of metastatic  disease  Assessment & Plan: [name] is a 75 y.o. 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  Patient is overall doing well.  We discussed PET findings, with an avid vulvar lesion, no obvious evidence of metastatic disease.  Although close proximity to the urethra, I recommend that we proceed with partial radical anterior vulvectomy and bilateral sentinel lymph nodes for lymph node assessment.  Given central lesion, she will need bilateral lymph node assessment.  I discussed that since the cancer involves her clitoris, vulvar resection will involve clitorectomy.  Her lesion is in very close proximity to the urethra.  I will plan to remove likely the distal portion of her urethra to attempt to achieve a negative margin.  This will mean that she will go home with a catheter for 7-10 days.  Given the coordination of care with 2 additional services, we will plan to set this procedure up at [facility].  I have asked my office to reach out to make her [facility] medical record number and then we will submit the request to get her scheduled.  This will involve a preoperative visit prior to her surgery date and then a nuclear medicine visit the morning of surgery for radiotracer injection.  Patient is scheduled to see her cardiologist on August 8.  I am hoping to get her vulvar surgery scheduled on August 10.  I have asked her to reach out to her cardiologist to see if she can be seen sooner than the eighth.  I will also have my office resend cardiac clearance both regarding her risk for vulvar surgery from a cardiac standpoint and also regarding holding her Eliquis for at least several days before and after the surgery.  She is somewhat hesitant about surgery.  I have asked her to discuss this with family and to let me know in the  next several days if she is not amenable to proceeding with surgery.  We had previously discussed that depending on her PET results as well as her clearance for surgery, that if she was not deemed to be a candidate for surgery we would proceed with primary radiation therapy.  I discussed the assessment and treatment plan with the patient. The patient was provided with an opportunity to ask questions and all were answered. The patient agreed with the plan and demonstrated an understanding of the instructions.   The patient was advised to call back or see an in-person evaluation if the symptoms worsen or if the condition fails to improve as anticipated.   24 minutes of total time was spent for this patient encounter, including preparation, over-the-phone counseling with the patient and coordination of care, and documentation of the encounter.   Jeral Pinch, MD  Division of Gynecologic Oncology  Department of Obstetrics and Gynecology  [facility]

## 2020-08-09 ENCOUNTER — Inpatient Hospital Stay: Payer: Medicare HMO

## 2020-08-09 ENCOUNTER — Other Ambulatory Visit: Payer: Self-pay

## 2020-08-09 ENCOUNTER — Other Ambulatory Visit: Payer: Self-pay | Admitting: Genetic Counselor

## 2020-08-09 ENCOUNTER — Inpatient Hospital Stay (HOSPITAL_BASED_OUTPATIENT_CLINIC_OR_DEPARTMENT_OTHER): Payer: Medicare HMO | Admitting: Genetic Counselor

## 2020-08-09 DIAGNOSIS — Z8049 Family history of malignant neoplasm of other genital organs: Secondary | ICD-10-CM | POA: Diagnosis not present

## 2020-08-09 DIAGNOSIS — C519 Malignant neoplasm of vulva, unspecified: Secondary | ICD-10-CM

## 2020-08-09 DIAGNOSIS — Z8041 Family history of malignant neoplasm of ovary: Secondary | ICD-10-CM

## 2020-08-09 DIAGNOSIS — Z808 Family history of malignant neoplasm of other organs or systems: Secondary | ICD-10-CM

## 2020-08-09 DIAGNOSIS — Z800 Family history of malignant neoplasm of digestive organs: Secondary | ICD-10-CM

## 2020-08-09 LAB — GENETIC SCREENING ORDER

## 2020-08-10 ENCOUNTER — Telehealth: Payer: Self-pay

## 2020-08-10 ENCOUNTER — Encounter: Payer: Self-pay | Admitting: Genetic Counselor

## 2020-08-10 DIAGNOSIS — Z8049 Family history of malignant neoplasm of other genital organs: Secondary | ICD-10-CM | POA: Insufficient documentation

## 2020-08-10 DIAGNOSIS — Z8051 Family history of malignant neoplasm of kidney: Secondary | ICD-10-CM | POA: Insufficient documentation

## 2020-08-10 DIAGNOSIS — Z800 Family history of malignant neoplasm of digestive organs: Secondary | ICD-10-CM | POA: Insufficient documentation

## 2020-08-10 NOTE — Telephone Encounter (Signed)
Received phone call from Ms. Talton requesting more information about her surgery at [facility] on 8/10. Explained that [facility] will reach out to her closer to her surgery date with the exact details.  Ms. Lagesse said she is still having some itching and now has burning with urination. She states she tried to put lidocaine near her urethra to help with the discomfort. Per Dr. Berline Lopes with the location of the tumor this won't go away until after surgery. Suggested she try using a squirt bottle with water to spray on urethra area as she begins to urinate to alleviate the burning sensation. Patient verbalized understanding and will give it a try.

## 2020-08-10 NOTE — Progress Notes (Signed)
REFERRING PROVIDER: Lafonda Mosses, MD [place],  [place] [id number]  PRIMARY PROVIDER:  Deland Pretty, MD  PRIMARY REASON FOR VISIT:  1. Vulvar cancer ([place])   2. Family history of pancreatic cancer   3. Family history of ovarian cancer   4. Family history of uterine cancer   5. Family history of thyroid cancer   6. Family history of throat cancer      HISTORY OF PRESENT ILLNESS:   [name], a 75 y.o. female, was seen for a [facility] cancer genetics consultation at the request of Dr. Berline Lopes due to a personal and family history of cancer.  [name] presents to clinic today to discuss the possibility of a hereditary predisposition to cancer, genetic testing, and to further clarify her future cancer risks, as well as potential cancer risks for family members.   In June of 2022, at the age of 39, [name] was diagnosed with squamous cell carcinoma of the vulva. The treatment plan includes consideration for surgery followed by radiation.    CANCER HISTORY:  Oncology History  Vulvar cancer ([place])  07/20/2020 Initial Diagnosis   Vulvar cancer ([place])   07/26/2020 Surgery   EUA, vulvar biopsies  Findings: Pictures taken in media.  On exam, there is about a 4 x 3-1/2 cm area concerning for involvement by carcinoma that spans between the clitoris (replacing the bottom aspect of the clitoris) to just superior to the urethra.  This is a butterfly lesion that extends along the inner labia bilaterally.  Previous biopsy site is healing well with suture still intact.  There is a firmer and raised area that is approximately 1 cm around the area of the biopsy.  Biopsies taken circumferentially around the lesion noted as well as from the central aspect to help delineate what is cancer and what may be chronic dermatoses.  Given appearance today with patient more comfortable, I am suspicious that the entire lesion is carcinoma.   07/26/2020 Pathology Results   A. VULVA, 6 OCLOCK,  INFERIOR ASPECT, BIOPSY:  - Superficial fragments of squamous cell carcinoma.   B. VULVA, 11 OCLOCK, BIOPSY:  - Invasive squamous cell carcinoma.  - No lymphovascular invasion.   C. VULVA, 2 OCLOCK, BIOPSY:  - Invasive squamous cell carcinoma.  - No lymphovascular invasion.   D. VULVA, CENTRAL, BIOPSY:  - Superficial fragment of squamous cell carcinoma.    08/06/2020 Imaging   PET: no evidence of metastatic disease   08/07/2020 Clinical Stage   Stage IB      RISK FACTORS:  Menarche was at age 17.  First live birth at age 84.  OCP use for approximately 2 years.  Ovaries intact: yes.  Hysterectomy: no.  Menopausal status: postmenopausal.  HRT use: 0 years. Colonoscopy: yes;  08/16/2008 . Mammogram within the last year: no. Number of breast biopsies: 0. Any excessive radiation exposure in the past: no.   Past Medical History:  Diagnosis Date   Arthritis    Diabetes mellitus without complication ([place])    Dyspnea    Encounter for care of pacemaker 09/03/2018   Family history of throat cancer    Family history of thyroid cancer    Family history of uterine cancer    History of chickenpox    Hx of psoriatic arthritis    Hyperlipemia    Hypertension    Hypertension 05/03/2018   Hypothyroidism    Mitral valve disorders(424.0)    Obstructive sleep apnea    Pacemaker  Paroxysmal atrial fibrillation ([place]) 10/11/2016   Peripheral neuropathy    Renal disorder Kidney disease stage2   Thyroid disease hypothyroid   Urine incontinence    Vitamin D deficiency     Past Surgical History:  Procedure Laterality Date   BACK SURGERY     BACK SURGERY     CARDIAC CATHETERIZATION N/A May 17, 2015   Procedure: Temporary Pacemaker;  Surgeon: Adrian Prows, MD;  Location: [facility];  Service: Cardiovascular;  Laterality: N/A;   CARDIOVERSION N/A 09/07/2018   Procedure: CARDIOVERSION;  Surgeon: Adrian Prows, MD;  Location: [facility];  Service: Cardiovascular;  Laterality: N/A;    CARDIOVERSION     COLONOSCOPY     EP IMPLANTABLE DEVICE N/A 04/30/2015   Procedure: Pacemaker Implant;  Surgeon: Evans Lance, MD;  Location: [facility];  Service: Cardiovascular;  Laterality: N/A;   INTRAVASCULAR PRESSURE WIRE/FFR STUDY N/A 08/31/2017   Procedure: INTRAVASCULAR PRESSURE WIRE/FFR STUDY;  Surgeon: Nigel Mormon, MD;  Location: [facility];  Service: Cardiovascular;  Laterality: N/A;   LEFT AND RIGHT HEART CATHETERIZATION WITH CORONARY ANGIOGRAM N/A 09/30/2011   Procedure: LEFT AND RIGHT HEART CATHETERIZATION WITH CORONARY ANGIOGRAM;  Surgeon: Laverda Page, MD;  Location: [facility];  Service: Cardiovascular;  Laterality: N/A;   RIGHT/LEFT HEART CATH AND CORONARY ANGIOGRAPHY N/A 08/31/2017   Procedure: RIGHT/LEFT HEART CATH AND CORONARY ANGIOGRAPHY;  Surgeon: Nigel Mormon, MD;  Location: [facility];  Service: Cardiovascular;  Laterality: N/A;   TOOTH EXTRACTION  10/07/2018   TUBAL LIGATION     VULVA Milagros Loll BIOPSY N/A 07/26/2020   Procedure: VULVAR BIOPSY;  Surgeon: Lafonda Mosses, MD;  Location: [facility];  Service: Gynecology;  Laterality: N/A;   YAG LASER APPLICATION Right 60/45/4098   Procedure: YAG LASER APPLICATION;  Surgeon: Elta Guadeloupe T. Gershon Crane, MD;  Location: [facility];  Service: Ophthalmology;  Laterality: Right;  pt knows to arrive at 11:91   YAG LASER APPLICATION Left 47/82/9562   Procedure: YAG LASER APPLICATION;  Surgeon: Rutherford Guys, MD;  Location: [facility];  Service: Ophthalmology;  Laterality: Left;    Social History   Socioeconomic History   Marital status: Widowed    Spouse name: Not on file   Number of children: 3   Years of education: Not on file   Highest education level: Not on file  Occupational History   Not on file  Tobacco Use   Smoking status: Never   Smokeless tobacco: Never  Vaping Use   Vaping Use: Never used  Substance and Sexual Activity   Alcohol use: No    Alcohol/week: 0.0 standard  drinks   Drug use: No   Sexual activity: Not Currently    Birth control/protection: Post-menopausal  Other Topics Concern   Not on file  Social History Narrative   Husband recently passed away on 2020/05/16.   Social Determinants of Health   Financial Resource Strain: Not on file  Food Insecurity: Not on file  Transportation Needs: Not on file  Physical Activity: Not on file  Stress: Not on file  Social Connections: Not on file     FAMILY HISTORY:  We obtained a detailed, 4-generation family history.  Significant diagnoses are listed below: Family History  Problem Relation Age of Onset   Arthritis Mother    Diabetes Mother    Heart disease Mother    Hyperlipidemia Mother    Hypertension Mother    Arthritis Father    Asthma Father  Heart attack Father    Hyperlipidemia Father    Hypertension Father    Stroke Father    Arthritis Sister    Diabetes Sister    Hypertension Sister    Arthritis Sister    Diabetes Sister    Hyperlipidemia Sister    Hypertension Sister    Stroke Sister    Ovarian cancer Sister 64   Pancreatic cancer Sister 41   Uterine cancer Sister 88   Hyperlipidemia Sister    Hypertension Sister    Arthritis Brother    Heart attack Brother    Heart disease Brother    Hyperlipidemia Brother    Hypertension Brother    Alcohol abuse Brother    Arthritis Brother    Diabetes Brother    Early death Brother    Heart disease Brother    Hyperlipidemia Brother    Hypertension Brother    Cancer Maternal Aunt 70       unknown type   Throat cancer Maternal Grandfather        hx smoking/drinking   Hypertension Daughter    Cancer Daughter        "female cancer cells"   Kidney cancer Nephew        dx 45s   Colon cancer Neg Hx    Breast cancer Neg Hx    Prostate cancer Neg Hx    [name] has two daughters (ages 15 and 34) and one son (age 72). Her younger daughter has had been treated for "female" cancer cells. She had three brothers and  three sisters. One sister had uterine cancer at age 62 and then pancreatic cancer at age 34. Another sister had ovarian cancer at age 9, although she died from an aneurysm at age 70. One nephew died from kidney cancer in his 69s.   [name] mother died at age 74 without cancer. There were three maternal aunts and one maternal uncle. One aunt died from an unknown cancer around age 45. There is no known cancer among maternal maternal cousins. [name] maternal grandmother died older than 33 without cancer. Her maternal grandfather died older than 7 with throat cancer, and had a history of smoking and alcohol use (he made liquor).  [name] father died at age 53 without cancer. There were two paternal aunts and three paternal uncles. There is no known cancer among paternal aunts/uncles or paternal cousins. [name] paternal grandmother died younger than 48 without cancer. Her paternal grandfather died older than 25 without cancer.  [name] is unaware of previous family history of genetic testing for hereditary cancer risks. Patient's ancestors are of unknown descent. There is no reported Ashkenazi Jewish ancestry. There is no known consanguinity.  GENETIC COUNSELING ASSESSMENT: [name] is a 75 y.o. female with a personal and family history of cancer which is somewhat suggestive of a hereditary cancer syndrome and predisposition to cancer. We, therefore, discussed and recommended the following at today's visit.   DISCUSSION: We discussed that approximately 5-10% of cancer is hereditary, with most cases of hereditary ovarian cancer and/or pancreatic cancer associated with the BRCA1 and BRCA2 genes. There are other genes that can be associated with hereditary ovarian, pancreatic, and/or uterine cancer cancer syndromes. These include the Lynch syndrome genes, BRIP1, RAD51C, RAD51D, etc. We discussed that testing is beneficial for several reasons, including knowing about other cancer risks,  identifying potential screening and risk-reduction options that may be appropriate, and to understand if other family members could be at risk  for cancer and allow them to undergo genetic testing.   We reviewed the characteristics, features and inheritance patterns of hereditary cancer syndromes. We also discussed genetic testing, including the appropriate family members to test, the process of testing, insurance coverage and turn-around-time for results. We discussed the implications of a negative, positive and/or variant of uncertain significant result. We recommended [name] pursue genetic testing for the [facility] Multi-Cancer + RNA panel.   The Multi-Cancer + RNA Panel offered by [facility] includes sequencing and/or deletion/duplication analysis of the following 84 genes:  AIP*, ALK, APC*, ATM*, AXIN2*, BAP1*, BARD1*, BLM*, BMPR1A*, BRCA1*, BRCA2*, BRIP1*, CASR, CDC73*, CDH1*, CDK4, CDKN1B*, CDKN1C*, CDKN2A, CEBPA, CHEK2*, CTNNA1*, DICER1*, DIS3L2*, EGFR, EPCAM, FH*, FLCN*, GATA2*, GPC3, GREM1, HOXB13, HRAS, KIT, MAX*, MEN1*, MET, MITF, MLH1*, MSH2*, MSH3*, MSH6*, MUTYH*, NBN*, NF1*, NF2*, NTHL1*, PALB2*, PDGFRA, PHOX2B, PMS2*, POLD1*, POLE*, POT1*, PRKAR1A*, PTCH1*, PTEN*, RAD50*, RAD51C*, RAD51D*, RB1*, RECQL4, RET, RUNX1*, SDHA*, SDHAF2*, SDHB*, SDHC*, SDHD*, SMAD4*, SMARCA4*, SMARCB1*, SMARCE1*, STK11*, SUFU*, TERC, TERT, TMEM127*, Tp53*, TSC1*, TSC2*, VHL*, WRN*, and WT1.  RNA analysis is performed for * genes.  Based on [name]'s personal and family history of cancer, she meets medical criteria for genetic testing. Despite that she meets criteria, there may still be an out of pocket cost.   PLAN: After considering the risks, benefits, and limitations, [name] provided informed consent to pursue genetic testing and the blood sample was sent to [facility] for analysis of the Multi-Cancer + RNA panel. Results should be available within approximately two-three weeks' time, at which  point they will be disclosed by telephone to [name], as will any additional recommendations warranted by these results. [name] will receive a summary of her genetic counseling visit and a copy of her results once available. This information will also be available in Epic.   [name] questions were answered to her satisfaction today. Our contact information was provided should additional questions or concerns arise. Thank you for the referral and allowing [place] to share in the care of your patient.   Clint Guy, [place], [facility] Licensed, Certified Dispensing [email or web address] Phone: [phone]  The patient was seen for a total of 40 minutes in face-to-face genetic counseling. Patient was seen alone. This patient was discussed with Drs. Magrinat, Lindi Adie and/or Burr Medico who agrees with the above.    _______________________________________________________________________ For Office Staff:  Number of people involved in session: 1 Was an Intern/ student involved with case: no

## 2020-08-12 DIAGNOSIS — N183 Chronic kidney disease, stage 3 unspecified: Secondary | ICD-10-CM | POA: Diagnosis not present

## 2020-08-12 DIAGNOSIS — I130 Hypertensive heart and chronic kidney disease with heart failure and stage 1 through stage 4 chronic kidney disease, or unspecified chronic kidney disease: Secondary | ICD-10-CM | POA: Diagnosis not present

## 2020-08-12 DIAGNOSIS — E1122 Type 2 diabetes mellitus with diabetic chronic kidney disease: Secondary | ICD-10-CM | POA: Diagnosis not present

## 2020-08-12 DIAGNOSIS — I5032 Chronic diastolic (congestive) heart failure: Secondary | ICD-10-CM | POA: Diagnosis not present

## 2020-08-15 DIAGNOSIS — Z01818 Encounter for other preprocedural examination: Secondary | ICD-10-CM | POA: Diagnosis not present

## 2020-08-15 DIAGNOSIS — C519 Malignant neoplasm of vulva, unspecified: Secondary | ICD-10-CM | POA: Diagnosis not present

## 2020-08-15 DIAGNOSIS — Z20822 Contact with and (suspected) exposure to covid-19: Secondary | ICD-10-CM | POA: Diagnosis not present

## 2020-08-15 DIAGNOSIS — Z01812 Encounter for preprocedural laboratory examination: Secondary | ICD-10-CM | POA: Diagnosis not present

## 2020-08-16 ENCOUNTER — Ambulatory Visit: Payer: Medicare HMO | Admitting: Cardiology

## 2020-08-16 ENCOUNTER — Encounter: Payer: Self-pay | Admitting: Cardiology

## 2020-08-16 ENCOUNTER — Other Ambulatory Visit: Payer: Self-pay

## 2020-08-16 VITALS — BP 144/71 | HR 60 | Temp 99.0°F | Resp 16 | Ht 62.5 in | Wt 185.8 lb

## 2020-08-16 DIAGNOSIS — E119 Type 2 diabetes mellitus without complications: Secondary | ICD-10-CM

## 2020-08-16 DIAGNOSIS — I2510 Atherosclerotic heart disease of native coronary artery without angina pectoris: Secondary | ICD-10-CM | POA: Diagnosis not present

## 2020-08-16 DIAGNOSIS — Z01810 Encounter for preprocedural cardiovascular examination: Secondary | ICD-10-CM

## 2020-08-16 DIAGNOSIS — Z794 Long term (current) use of insulin: Secondary | ICD-10-CM

## 2020-08-16 DIAGNOSIS — I10 Essential (primary) hypertension: Secondary | ICD-10-CM | POA: Diagnosis not present

## 2020-08-16 DIAGNOSIS — I5032 Chronic diastolic (congestive) heart failure: Secondary | ICD-10-CM | POA: Diagnosis not present

## 2020-08-16 DIAGNOSIS — I480 Paroxysmal atrial fibrillation: Secondary | ICD-10-CM

## 2020-08-16 MED ORDER — EMPAGLIFLOZIN 25 MG PO TABS: 25.0000 mg | ORAL_TABLET | Freq: Every day | ORAL | 6 refills | Status: DC

## 2020-08-16 NOTE — Progress Notes (Signed)
Primary Physician/Referring:  Deland Pretty, MD  Patient ID: [name], female    DOB: 1945/11/06, 75 y.o.   MRN: [id number]  Chief Complaint  Patient presents with   Hypertension   Coronary Artery Disease   Diastolic HF   Follow-up    3 months    HPI:    [name]  is a 75 y.o. female  with hypertension, type 2 diabetes mellitus with stage 3 CKD, chronic diastolic heart failure, mild hyperlipidemia on Tricor, nonobstructive CAD (Cath 2019), sick sinus syndrome status post dual-chamber pacemaker 2017, OSA on BiPAP, paroxysmal Afib and atrial flutter/atrial tachycardia.  Her last cardioversion was 09/07/2018 for atrial flutter.  Patient presents for 40-monthfollow-up of CHF and hypertension.  He has lost about 15 pounds in weight, she recently lost her husband who was critically and chronically ill.  She has not had any chest pain, dyspnea continues to be an issue, no leg edema, no PND or orthopnea.  Diabetes recently has been uncontrolled.  She was diagnosed with vulvar cancer, has been scheduled for surgery for the same and needs preoperative cardiac restratification.  Past Medical History:  Diagnosis Date   Arthritis    Diabetes mellitus without complication ([place]    Dyspnea    Encounter for care of pacemaker 09/03/2018   Family history of kidney cancer    Family history of throat cancer    Family history of uterine cancer    History of chickenpox    Hx of psoriatic arthritis    Hyperlipemia    Hypertension    Hypertension 05/03/2018   Hypothyroidism    Mitral valve disorders(424.0)    Obstructive sleep apnea    Pacemaker    Paroxysmal atrial fibrillation ([place] 10/11/2016   Peripheral neuropathy    Renal disorder Kidney disease stage2   Thyroid disease hypothyroid   Urine incontinence    Vitamin D deficiency    Past Surgical History:  Procedure Laterality Date   BACK SURGERY     BACK SURGERY     CARDIAC CATHETERIZATION N/A 04/29/2015   Procedure:  Temporary Pacemaker;  Surgeon: JAdrian Prows MD;  [facility];  Service: Cardiovascular;  Laterality: N/A;   CARDIOVERSION N/A 09/07/2018   Procedure: CARDIOVERSION;  Surgeon: GAdrian Prows MD;  [place] [facility]  Service: Cardiovascular;  Laterality: N/A;   CARDIOVERSION     COLONOSCOPY     EP IMPLANTABLE DEVICE N/A 04/30/2015   Procedure: Pacemaker Implant;  Surgeon: GEvans Lance MD;  [facility];  Service: Cardiovascular;  Laterality: N/A;   INTRAVASCULAR PRESSURE WIRE/FFR STUDY N/A 08/31/2017   Procedure: INTRAVASCULAR PRESSURE WIRE/FFR STUDY;  Surgeon: PNigel Mormon MD;  [facility];  Service: Cardiovascular;  Laterality: N/A;   LEFT AND RIGHT HEART CATHETERIZATION WITH CORONARY ANGIOGRAM N/A 09/30/2011   Procedure: LEFT AND RIGHT HEART CATHETERIZATION WITH CORONARY ANGIOGRAM;  Surgeon: JLaverda Page MD;  [facility];  Service: Cardiovascular;  Laterality: N/A;   RIGHT/LEFT HEART CATH AND CORONARY ANGIOGRAPHY N/A 08/31/2017   Procedure: RIGHT/LEFT HEART CATH AND CORONARY ANGIOGRAPHY;  Surgeon: PNigel Mormon MD;  [facility];  Service: Cardiovascular;  Laterality: N/A;   TOOTH EXTRACTION  10/07/2018   TUBAL LIGATION     VULVA /Milagros LollBIOPSY N/A 07/26/2020   Procedure: VULVAR BIOPSY;  Surgeon: TLafonda Mosses MD;  Location: [facility];  Service: Gynecology;  Laterality: N/A;   YAG LASER APPLICATION Right 048/27/0786  Procedure: YAG LASER APPLICATION;  Surgeon: Elta Guadeloupe T. Gershon Crane, MD;  Location: [facility];  Service: Ophthalmology;  Laterality: Right;  pt knows to arrive at 68:34   YAG LASER APPLICATION Left 19/62/2297   Procedure: YAG LASER APPLICATION;  Surgeon: Rutherford Guys, MD;  Location: [facility];  Service: Ophthalmology;  Laterality: Left;   Family History  Problem Relation Age of Onset   Arthritis Mother    Diabetes Mother    Heart disease Mother    Hyperlipidemia Mother     Hypertension Mother    Arthritis Father    Asthma Father    Heart attack Father    Hyperlipidemia Father    Hypertension Father    Stroke Father    Arthritis Sister    Diabetes Sister    Hypertension Sister    Arthritis Sister    Diabetes Sister    Hyperlipidemia Sister    Hypertension Sister    Stroke Sister    Ovarian cancer Sister 1   Pancreatic cancer Sister 59   Uterine cancer Sister 84   Hyperlipidemia Sister    Hypertension Sister    Arthritis Brother    Heart attack Brother    Heart disease Brother    Hyperlipidemia Brother    Hypertension Brother    Alcohol abuse Brother    Arthritis Brother    Diabetes Brother    Early death Brother    Heart disease Brother    Hyperlipidemia Brother    Hypertension Brother    Cancer Maternal Aunt 70       unknown type   Throat cancer Maternal Grandfather        hx smoking/drinking   Hypertension Daughter    Cancer Daughter        "female cancer cells"   Kidney cancer Nephew        dx 64s   Colon cancer Neg Hx    Breast cancer Neg Hx    Prostate cancer Neg Hx     Social History   Tobacco Use   Smoking status: Never   Smokeless tobacco: Never  Substance Use Topics   Alcohol use: No    Alcohol/week: 0.0 standard drinks   Marital Status: Married  ROS  Review of Systems  Constitutional: Positive for malaise/fatigue.  Cardiovascular:  Positive for dyspnea on exertion (chronic). Negative for chest pain, claudication, leg swelling, near-syncope, orthopnea, palpitations, paroxysmal nocturnal dyspnea and syncope.  Respiratory:  Negative for shortness of breath.   Hematologic/Lymphatic: Does not bruise/bleed easily.  Gastrointestinal:  Negative for melena.  Neurological:  Negative for dizziness and weakness.  Psychiatric/Behavioral:  Positive for depression.   Objective  Blood pressure (!) 144/71, pulse 60, temperature 99 F (37.2 C), temperature source Temporal, resp. rate 16, height 5' 2.5" (1.588 m), weight 185 lb  12.8 oz (84.3 kg), SpO2 95 %.  Vitals with BMI 08/16/2020 07/26/2020 07/26/2020  Height 5' 2.5" - -  Weight 185 lbs 13 oz - -  BMI 98.92 - -  Systolic 119 417 408  Diastolic 71 59 71  Pulse 60 60 60     Physical Exam Constitutional:      Appearance: She is well-developed.  Neck:     Vascular: No JVD.  Cardiovascular:     Rate and Rhythm: Normal rate and regular rhythm.     Pulses: Intact distal pulses.          Carotid pulses are 2+ on the right side and 2+ on the left side with bruit.  Popliteal pulses are 1+ on the right side and 1+ on the left side.       Dorsalis pedis pulses are 1+ on the right side and 1+ on the left side.       Posterior tibial pulses are 1+ on the right side and 1+ on the left side.     Heart sounds: S1 normal and S2 normal. Murmur heard.  Midsystolic murmur is present with a grade of 2/6 at the upper right sternal border and apex.    No gallop.  Pulmonary:     Effort: Pulmonary effort is normal. No accessory muscle usage or respiratory distress.     Breath sounds: Normal breath sounds.  Musculoskeletal:        General: Normal range of motion.     Right lower leg: No edema.     Left lower leg: No edema.  Skin:    General: Skin is warm and dry.   Laboratory examination:   Recent Labs    08/26/19 1055 07/26/20 1115  NA 136 139  K 4.7 4.0  CL 100 105  CO2 27 27  GLUCOSE 176* 158*  BUN 30* 22  CREATININE 1.34* 1.12*  CALCIUM 10.3 10.3  GFRNONAA 38* 51*  GFRAA 44*  --    CrCl cannot be calculated (Patient's most recent lab result is older than the maximum 21 days allowed.).  CMP Latest Ref Rng & Units 07/26/2020 08/26/2019 09/07/2018  Glucose 70 - 99 mg/dL 158(H) 176(H) 149(H)  BUN 8 - 23 mg/dL 22 30(H) 30(H)  Creatinine 0.44 - 1.00 mg/dL 1.12(H) 1.34(H) 1.50(H)  Sodium 135 - 145 mmol/L 139 136 139  Potassium 3.5 - 5.1 mmol/L 4.0 4.7 4.9  Chloride 98 - 111 mmol/L 105 100 99  CO2 22 - 32 mmol/L 27 27 -  Calcium 8.9 - 10.3 mg/dL 10.3 10.3 -   Total Protein 6.5 - 8.1 g/dL - - -  Total Bilirubin 0.3 - 1.2 mg/dL - - -  Alkaline Phos 38 - 126 U/L - - -  AST 15 - 41 U/L - - -  ALT 0 - 44 U/L - - -   CBC Latest Ref Rng & Units 07/26/2020 08/26/2019 09/07/2018  WBC 4.0 - 10.5 K/uL 7.9 7.1 -  Hemoglobin 12.0 - 15.0 g/dL 13.4 13.6 15.3(H)  Hematocrit 36.0 - 46.0 % 41.7 43.0 45.0  Platelets 150 - 400 K/uL 148(L) 155 -   Lipid Panel     Component Value Date/Time   CHOL 168 08/29/2017 0328   TRIG 544 (H) 08/29/2017 0328   HDL 19 (L) 08/29/2017 0328   CHOLHDL 8.8 08/29/2017 0328   VLDL UNABLE TO CALCULATE IF TRIGLYCERIDE OVER 400 mg/dL 08/29/2017 0328   LDLCALC UNABLE TO CALCULATE IF TRIGLYCERIDE OVER 400 mg/dL 08/29/2017 0328   HEMOGLOBIN A1C Lab Results  Component Value Date   HGBA1C 7.8 (H) 07/26/2020   MPG 177.16 07/26/2020   TSH Recent Labs    08/26/19 1054  TSH 4.404    External labs:  10/25/2019: Sodium 142, glucose 135, BUN 24, GFR 42, potassium 5.5, AST 22, ALT 37, alk phos 25 A1c 6.9% Total cholesterol 177, triglycerides 240, LDL 100, HDL 29 TSH 3.61  Cholesterol, total 168.000 m 08/29/2017 HDL 17.000 mg 06/09/2019 LDL-C 100.000 ca 06/09/2019 Triglycerides 343.000 m 06/09/2019  A1C 7.200 % 06/24/2019 TSH 5.100 08/31/2019  Hemoglobin 13.600 g/d 08/26/2019 Platelets 155.000 K/ 08/26/2019  Creatinine, Serum 1.340 mg/ 08/26/2019 Potassium 4.700 mm 08/26/2019 Magnesium 1.700 MG/ 04/24/2015 ALT (  SGPT) 21.000 IU/ 06/24/2019  05/21/2018: HbA1c 6.7. Lipid Panel: Chol 146, Trig 321, HDL 17, LDL 65, VLDL 64. ALK Ph 32.   08/28/17: TSH Normal  Medications and allergies   Allergies  Allergen Reactions   Hydrocodone Other (See Comments)    Lethargic    Invokana [Canagliflozin] Palpitations and Other (See Comments)    Made heart race   Oxycodone Other (See Comments)    Lethargic      Current Outpatient Medications  Medication Instructions   amiodarone (PACERONE) 200 MG tablet Take 1/2 (one-half) tablet by  mouth once daily   ELIQUIS 5 MG TABS tablet Take 1 tablet by mouth twice daily   empagliflozin (JARDIANCE) 25 mg, Oral, Daily before breakfast   Euthyrox 88 mcg, Oral, Daily   fenofibrate 160 mg, Oral, Daily with supper   Gemtesa 75 mg, Oral, Daily   insulin aspart protamine- aspart (NOVOLOG MIX 70/30) (70-30) 100 UNIT/ML injection 35-45 Units, Subcutaneous, See admin instructions, Inject 45 units into the skin with breakfast, 35 units with supper (may adjust based on blood sugar readings)   isosorbide mononitrate (IMDUR) 60 mg, Oral, Daily   lidocaine (XYLOCAINE) 5 % ointment 1 application, Topical, 3 times daily PRN   metoprolol tartrate (LOPRESSOR) 50 mg, Oral, 2 times daily   nitroGLYCERIN (NITROSTAT) 0.4 mg, Sublingual, Every 5 min PRN   nystatin cream (MYCOSTATIN) APPLY  CREAM TOPICALLY TWICE DAILY AS NEEDED FOR  IRRIATATION  OF  AFFECTED  AREA   olmesartan-hydrochlorothiazide (BENICAR HCT) 40-25 MG tablet 1 tablet, Oral, Daily   spironolactone (ALDACTONE) 25 MG tablet Take 1 tablet by mouth once daily   SURE COMFORT INSULIN SYRINGE 31G X 5/16" 0.5 ML MISC No dose, route, or frequency recorded.   torsemide (DEMADEX) 5 mg, Daily PRN   triamcinolone ointment (KENALOG) 0.5 % Topical, 2 times daily   Radiology:   No results found.  Cardiac Studies:   Carotid Doppler 02/10/2014: No evidence of hemodynamically significant stenosis in the bilateral carotid bifurcation vessels. There is mild evidence of homogeneous plaque in bilateral carotid arteries. No significant change from 10/13/2011.   Lexiscan myoview stress test 06/15/2017: 1. Lexiscan stress test was performed. Exercise capacity was not assessed. Stress symptoms included dizziness. Peak blood pressure 158/66 mmHg. Stress EKG is non diagnostic for ischemia as it is a pharmacologic stress. In addition, it demonstrated atrial pacing and incomplete LBBB. 2. The overall quality of the study is excellent. There is no evidence of abnormal  lung activity. Stress and rest SPECT images demonstrate homogeneous tracer distribution throughout the myocardium. Gated SPECT imaging reveals normal myocardial thickening and wall motion. The left ventricular ejection fraction was normal (71%). 3. Low risk study.   Sleep Study 08/11/2017: Positive for Complex Sleep Apnea; On CPAP    R&LHC 08/31/2017: LCx: Nondominant. AV grove LCx 50-60% diffuse disease. RCA: Large dominant. Ostial PDA 50% stenosis, FFR 0.96. No change from 09/30/2011. RA: 5 mmHg RV: 45/4 mmHg. PA: 40/12 mmHg. Mean PA 29 mmHg PCWP: 16 mmHg LV: 130/3 mmHg, LVEDP 13 mmHg CO: 3.6 L/min. CI 1.9 L/min/m2  Direct current cardioversion 09/07/2018: Indication symptomatic A. Flutter. Procedure: Using 50 mg of IV Propofol and 20 IV Lidocaine (for reducing venous pain) for achieving deep sedation, synchronized direct current cardioversion performed. Patient was delivered with 120 Joules of electricity X 1 with success to A-Paced Rhythm. Patient tolerated the procedure well. No immediate complication noted.   Echocardiogram 11/03/2018: Left ventricle cavity is normal in size. Moderate concentric hypertrophy of the  left ventricle. Normal LV systolic function with EF 56%. Normal global wall motion. Unable to evaluate diastolic function due to E/A fusion.  Left atrial cavity is severely dilated. Trace aortic regurgitation. Moderate (Grade III) mitral regurgitation. Mild mitral valve leaflet thickening. Moderate tricuspid regurgitation. Estimated pulmonary artery systolic pressure is 40 mmHg.  No significant change compared to previous study on 07/01/2017.   Pacemaker:   Scheduled Remote pacemaker check 12/12/2019:  No mode switches. No VHR episodes. Health trends do not demonstrate significant abnormality. Battery longevity is 9.5 years. RA pacing is 99.6 %, RV pacing is 7.7 %.  Scheduled In office pacemaker check 08/30/2018:  There were 6 AHR Episodes Since 08/08/2018, EGM  recordings reveal maximum a rate between 180-201 bpm,, The longest His persistent since 08/30/2018,Atrial rate 180, ventricular rate 1 28 bpm, EGM's suggest atrial tachycardia/AFL. There was 0.4% truly due to atrial arrhythmia burden, pacemaker thresholds and impedance stable. Longevity 8.5, 12 years  EKG:   05/18/2020: Atrial paced rhythm with first-degree AV block at a rate of 60 bpm.  Left axis, left anterior fascicular block.  Poor R wave progression, cannot exclude anteroseptal infarct old.  IVCD.  Compared to EKG 11/10/2019, no significant change.  11/10/2019: Atrially paced rhythm with first-degree AV block, ventricularly sensed.  Ventricular rate 60 bpm.  Left axis deviation, left intrafascicular block.  Anteroseptal infarct old.  IVCD, borderline criteria for LVH.  Nonspecific T abnormality.  No significant change from 01/11/2019.  08/30/2018: Atypical atrial flutter with 3:1 conduction, V rate 110 bpm, left axis deviation. LBBB.   Assessment     ICD-10-CM   1. Essential hypertension  I10     2. Pre-operative cardiovascular examination  Z01.810     3. Chronic diastolic heart failure (HCC)  I50.32 empagliflozin (JARDIANCE) 25 MG TABS tablet    4. Paroxysmal atrial fibrillation (HCC)  I48.0     5. Coronary artery disease involving native coronary artery of native heart without angina pectoris  I25.10     6. Type 2 diabetes mellitus without complication, with long-term current use of insulin (HCC)  E11.9    Z79.4       Meds ordered this encounter  Medications   empagliflozin (JARDIANCE) 25 MG TABS tablet    Sig: Take 1 tablet (25 mg total) by mouth daily before breakfast.    Dispense:  30 tablet    Refill:  6     Medications Discontinued During This Encounter  Medication Reason   Cholecalciferol (VITAMIN D-3) 1000 units CAPS Error   Multiple Vitamins-Minerals (HAIR/SKIN/NAILS) CAPS Error   Omega-3 Fatty Acids (FISH OIL OMEGA-3 PO) Error   vitamin B-12 (CYANOCOBALAMIN) 1000  MCG tablet Error    This patients CHA2DS2-VASc Score 7 (CHF, HTN, DM, Vasc, Age, F) and yearly risk of stroke >9.8%.   Recommendations:   [name]  is a 75 y.o. female  with hypertension, type 2 diabetes mellitus with stage 3 CKD, chronic diastolic heart failure, mild hyperlipidemia on fenobibrate, nonobstructive CAD (Cath 2019), sick sinus syndrome status post dual-chamber pacemaker 2017, OSA on BiPAP, paroxysmal Afib and atrial flutter/atrial tachycardia.  Her last cardioversion was 09/07/2018 for atrial flutter.  Has not had any recurrence of angina pectoris, dyspnea has remained stable and she has lost about 15 pounds in weight since last office visit 3 months ago.  Her husband recently passed away after a prolonged illness.  She is now scheduled for vulvar surgery in view of malignancy and needs cardiac clearance, which I suspect  she will do well with low risk and clearance letter will be sent to her GYN.  Anticoagulation issues discussed.  Blood pressure is still elevated, I would like to start her on Jardiance which may help with both weight loss and also diuresis and hopefully will improve her blood pressure control as well.  However I have recommended that she does not start Jardiance until her surgery is over and she has completely healed with regard to surgical scars and surgical bleed in view of high risk for infection.  Clear-cut instructions were given to the patient.  Lipids are well controlled otherwise, no clinical evidence of heart failure, she is maintaining sinus rhythm.  I will see her back in 3 months for follow-up in view of complex medical issues.  Surgical clearence letter sent.   Adrian Prows, PA-C 08/16/2020, 10:42 PM Office: [phone]

## 2020-08-17 ENCOUNTER — Telehealth: Payer: Self-pay | Admitting: Cardiology

## 2020-08-17 NOTE — Telephone Encounter (Signed)
[name] w/Dr. Charisse March office called about Pt regarding an upcoming surgery. Says they requested a cardiac clearance form, & also had a question as to whether or not Pt should hold off on taking her Eliquis, continue, modify, etc. Please call her back at [phone] to discuss.

## 2020-08-17 NOTE — Telephone Encounter (Signed)
Lvm for a return call [place] dictated letter for her surgery , get fax number

## 2020-08-17 NOTE — Telephone Encounter (Signed)
Letter faxed to dr. Berline Lopes [phone]

## 2020-08-19 DIAGNOSIS — G4733 Obstructive sleep apnea (adult) (pediatric): Secondary | ICD-10-CM | POA: Diagnosis not present

## 2020-08-20 ENCOUNTER — Ambulatory Visit: Payer: Medicare HMO | Admitting: Cardiology

## 2020-08-22 DIAGNOSIS — Z950 Presence of cardiac pacemaker: Secondary | ICD-10-CM | POA: Diagnosis not present

## 2020-08-22 DIAGNOSIS — R9431 Abnormal electrocardiogram [ECG] [EKG]: Secondary | ICD-10-CM | POA: Diagnosis not present

## 2020-08-22 DIAGNOSIS — Y838 Other surgical procedures as the cause of abnormal reaction of the patient, or of later complication, without mention of misadventure at the time of the procedure: Secondary | ICD-10-CM | POA: Diagnosis not present

## 2020-08-22 DIAGNOSIS — R1110 Vomiting, unspecified: Secondary | ICD-10-CM | POA: Diagnosis not present

## 2020-08-22 DIAGNOSIS — R918 Other nonspecific abnormal finding of lung field: Secondary | ICD-10-CM | POA: Diagnosis not present

## 2020-08-22 DIAGNOSIS — J9691 Respiratory failure, unspecified with hypoxia: Secondary | ICD-10-CM | POA: Diagnosis not present

## 2020-08-22 DIAGNOSIS — J811 Chronic pulmonary edema: Secondary | ICD-10-CM | POA: Diagnosis not present

## 2020-08-22 DIAGNOSIS — I480 Paroxysmal atrial fibrillation: Secondary | ICD-10-CM | POA: Diagnosis not present

## 2020-08-22 DIAGNOSIS — I9589 Other hypotension: Secondary | ICD-10-CM | POA: Diagnosis not present

## 2020-08-22 DIAGNOSIS — Z9981 Dependence on supplemental oxygen: Secondary | ICD-10-CM | POA: Diagnosis not present

## 2020-08-22 DIAGNOSIS — I130 Hypertensive heart and chronic kidney disease with heart failure and stage 1 through stage 4 chronic kidney disease, or unspecified chronic kidney disease: Secondary | ICD-10-CM | POA: Diagnosis not present

## 2020-08-22 DIAGNOSIS — C519 Malignant neoplasm of vulva, unspecified: Secondary | ICD-10-CM | POA: Diagnosis not present

## 2020-08-22 DIAGNOSIS — I509 Heart failure, unspecified: Secondary | ICD-10-CM | POA: Diagnosis not present

## 2020-08-22 DIAGNOSIS — E119 Type 2 diabetes mellitus without complications: Secondary | ICD-10-CM | POA: Diagnosis not present

## 2020-08-22 DIAGNOSIS — I081 Rheumatic disorders of both mitral and tricuspid valves: Secondary | ICD-10-CM | POA: Diagnosis not present

## 2020-08-22 DIAGNOSIS — G4730 Sleep apnea, unspecified: Secondary | ICD-10-CM | POA: Diagnosis not present

## 2020-08-22 DIAGNOSIS — E878 Other disorders of electrolyte and fluid balance, not elsewhere classified: Secondary | ICD-10-CM | POA: Diagnosis not present

## 2020-08-22 DIAGNOSIS — I444 Left anterior fascicular block: Secondary | ICD-10-CM | POA: Diagnosis not present

## 2020-08-22 DIAGNOSIS — N189 Chronic kidney disease, unspecified: Secondary | ICD-10-CM | POA: Diagnosis not present

## 2020-08-22 DIAGNOSIS — I10 Essential (primary) hypertension: Secondary | ICD-10-CM | POA: Diagnosis not present

## 2020-08-22 DIAGNOSIS — J9811 Atelectasis: Secondary | ICD-10-CM | POA: Diagnosis not present

## 2020-08-22 DIAGNOSIS — I499 Cardiac arrhythmia, unspecified: Secondary | ICD-10-CM | POA: Diagnosis not present

## 2020-08-22 DIAGNOSIS — I5032 Chronic diastolic (congestive) heart failure: Secondary | ICD-10-CM | POA: Diagnosis not present

## 2020-08-22 DIAGNOSIS — R0902 Hypoxemia: Secondary | ICD-10-CM | POA: Diagnosis not present

## 2020-08-22 DIAGNOSIS — I959 Hypotension, unspecified: Secondary | ICD-10-CM | POA: Diagnosis not present

## 2020-08-23 DIAGNOSIS — I499 Cardiac arrhythmia, unspecified: Secondary | ICD-10-CM | POA: Diagnosis not present

## 2020-08-23 DIAGNOSIS — I9589 Other hypotension: Secondary | ICD-10-CM | POA: Diagnosis not present

## 2020-08-23 DIAGNOSIS — R1110 Vomiting, unspecified: Secondary | ICD-10-CM | POA: Diagnosis not present

## 2020-08-23 DIAGNOSIS — R0902 Hypoxemia: Secondary | ICD-10-CM | POA: Diagnosis not present

## 2020-08-23 DIAGNOSIS — J811 Chronic pulmonary edema: Secondary | ICD-10-CM | POA: Diagnosis not present

## 2020-08-23 DIAGNOSIS — E878 Other disorders of electrolyte and fluid balance, not elsewhere classified: Secondary | ICD-10-CM | POA: Diagnosis not present

## 2020-08-23 DIAGNOSIS — Z9981 Dependence on supplemental oxygen: Secondary | ICD-10-CM | POA: Diagnosis not present

## 2020-08-23 DIAGNOSIS — I509 Heart failure, unspecified: Secondary | ICD-10-CM | POA: Diagnosis not present

## 2020-08-23 DIAGNOSIS — I959 Hypotension, unspecified: Secondary | ICD-10-CM | POA: Diagnosis not present

## 2020-08-23 DIAGNOSIS — I10 Essential (primary) hypertension: Secondary | ICD-10-CM | POA: Diagnosis not present

## 2020-08-23 DIAGNOSIS — C519 Malignant neoplasm of vulva, unspecified: Secondary | ICD-10-CM | POA: Diagnosis not present

## 2020-08-23 DIAGNOSIS — I081 Rheumatic disorders of both mitral and tricuspid valves: Secondary | ICD-10-CM | POA: Diagnosis not present

## 2020-08-27 ENCOUNTER — Telehealth: Payer: Self-pay | Admitting: Cardiology

## 2020-08-27 DIAGNOSIS — J9811 Atelectasis: Secondary | ICD-10-CM | POA: Diagnosis not present

## 2020-08-27 DIAGNOSIS — R0902 Hypoxemia: Secondary | ICD-10-CM | POA: Diagnosis not present

## 2020-08-27 DIAGNOSIS — I480 Paroxysmal atrial fibrillation: Secondary | ICD-10-CM | POA: Diagnosis not present

## 2020-08-27 DIAGNOSIS — J811 Chronic pulmonary edema: Secondary | ICD-10-CM | POA: Diagnosis not present

## 2020-08-27 DIAGNOSIS — R918 Other nonspecific abnormal finding of lung field: Secondary | ICD-10-CM | POA: Diagnosis not present

## 2020-08-27 DIAGNOSIS — G4730 Sleep apnea, unspecified: Secondary | ICD-10-CM | POA: Diagnosis not present

## 2020-08-27 NOTE — Telephone Encounter (Signed)
[name] w/[facility] called in regards to this Pt. [name] stated Pt recently underwent complex testing for health issues going on & wanted to know if we should see Pt sooner than her currently set appts. 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

## 2020-08-27 NOTE — Telephone Encounter (Signed)
You can just schedule pt appointment, we dont need to talk to them to do  that. Thanks

## 2020-08-28 ENCOUNTER — Telehealth: Payer: Self-pay

## 2020-08-28 NOTE — Telephone Encounter (Signed)
Returned call to patients son [name]. He states that they won't be able to make appointment with Dr. Berline Lopes tomorrow since [name] is still admitted at [facility]. Per Dr. Berline Lopes, appointment has been rescheduled with Joylene John, NP for Monday 8/22 at 11:30. [name] is in agreement of the new date and time. [name] voiced concerns about Nannies care at [facility]. He states he is worried she is not getting what she needs on the 6th floor. They are taking care of her surgery great but not her heart and BP. Instructed that I will follow up with Dr. Berline Lopes.

## 2020-08-28 NOTE — Telephone Encounter (Signed)
Spoke with patients son Marya Amsler. Per Dr. Berline Lopes cardiology has been involved in her care. Due to her age and heart health this may be a longer recovery. Cardiology has been working on diuresing her and getting her off O2.He verbalizes understanding. He states that [facility] called him this morning and said that Gracee might go home today. Instructed to call with any questions or concerns.

## 2020-08-29 ENCOUNTER — Encounter: Payer: Medicare HMO | Admitting: Gynecologic Oncology

## 2020-08-31 ENCOUNTER — Telehealth: Payer: Self-pay | Admitting: Gynecologic Oncology

## 2020-08-31 DIAGNOSIS — I480 Paroxysmal atrial fibrillation: Secondary | ICD-10-CM | POA: Diagnosis not present

## 2020-08-31 DIAGNOSIS — C519 Malignant neoplasm of vulva, unspecified: Secondary | ICD-10-CM | POA: Diagnosis not present

## 2020-08-31 DIAGNOSIS — E039 Hypothyroidism, unspecified: Secondary | ICD-10-CM | POA: Diagnosis not present

## 2020-08-31 DIAGNOSIS — G4733 Obstructive sleep apnea (adult) (pediatric): Secondary | ICD-10-CM | POA: Diagnosis not present

## 2020-08-31 DIAGNOSIS — I130 Hypertensive heart and chronic kidney disease with heart failure and stage 1 through stage 4 chronic kidney disease, or unspecified chronic kidney disease: Secondary | ICD-10-CM | POA: Diagnosis not present

## 2020-08-31 DIAGNOSIS — N189 Chronic kidney disease, unspecified: Secondary | ICD-10-CM | POA: Diagnosis not present

## 2020-08-31 DIAGNOSIS — I4891 Unspecified atrial fibrillation: Secondary | ICD-10-CM | POA: Diagnosis not present

## 2020-08-31 DIAGNOSIS — Z483 Aftercare following surgery for neoplasm: Secondary | ICD-10-CM | POA: Diagnosis not present

## 2020-08-31 DIAGNOSIS — I5032 Chronic diastolic (congestive) heart failure: Secondary | ICD-10-CM | POA: Diagnosis not present

## 2020-08-31 DIAGNOSIS — E1142 Type 2 diabetes mellitus with diabetic polyneuropathy: Secondary | ICD-10-CM | POA: Diagnosis not present

## 2020-08-31 DIAGNOSIS — I2510 Atherosclerotic heart disease of native coronary artery without angina pectoris: Secondary | ICD-10-CM | POA: Diagnosis not present

## 2020-08-31 NOTE — Telephone Encounter (Signed)
Called the patient to check in after recent discharge. She was sleeping and her daughter-in-law answered. Pain has been similar to what it was in the hospital. Ms. Saca has been sleeping a lot. Eating well. Having bowel and bladder function, at baseline. Breathing is about the same as it has been - she's using oxygen all the time.  They are aware of visit in clinic with Melissa on Monday. I will call sometime next week to review pathology and adjuvant treatment.   Valarie Cones MD

## 2020-09-01 DIAGNOSIS — R069 Unspecified abnormalities of breathing: Secondary | ICD-10-CM | POA: Diagnosis not present

## 2020-09-01 DIAGNOSIS — I5033 Acute on chronic diastolic (congestive) heart failure: Secondary | ICD-10-CM | POA: Diagnosis not present

## 2020-09-03 ENCOUNTER — Other Ambulatory Visit: Payer: Self-pay

## 2020-09-03 ENCOUNTER — Encounter: Payer: Self-pay | Admitting: Student

## 2020-09-03 ENCOUNTER — Ambulatory Visit: Payer: Medicare HMO | Admitting: Student

## 2020-09-03 ENCOUNTER — Telehealth: Payer: Self-pay

## 2020-09-03 ENCOUNTER — Encounter: Payer: Self-pay | Admitting: Gynecologic Oncology

## 2020-09-03 ENCOUNTER — Inpatient Hospital Stay: Payer: Medicare HMO | Attending: Gynecologic Oncology | Admitting: Gynecologic Oncology

## 2020-09-03 ENCOUNTER — Ambulatory Visit: Payer: Medicare HMO | Admitting: Cardiology

## 2020-09-03 ENCOUNTER — Inpatient Hospital Stay: Payer: Medicare HMO

## 2020-09-03 VITALS — BP 163/53 | HR 62 | Temp 98.5°F | Ht 62.5 in | Wt 185.2 lb

## 2020-09-03 VITALS — BP 147/45 | HR 61 | Temp 98.5°F | Resp 16 | Wt 182.6 lb

## 2020-09-03 DIAGNOSIS — I5032 Chronic diastolic (congestive) heart failure: Secondary | ICD-10-CM | POA: Diagnosis not present

## 2020-09-03 DIAGNOSIS — Z9981 Dependence on supplemental oxygen: Secondary | ICD-10-CM

## 2020-09-03 DIAGNOSIS — R0989 Other specified symptoms and signs involving the circulatory and respiratory systems: Secondary | ICD-10-CM

## 2020-09-03 DIAGNOSIS — I480 Paroxysmal atrial fibrillation: Secondary | ICD-10-CM | POA: Diagnosis not present

## 2020-09-03 DIAGNOSIS — I2510 Atherosclerotic heart disease of native coronary artery without angina pectoris: Secondary | ICD-10-CM | POA: Diagnosis not present

## 2020-09-03 DIAGNOSIS — C519 Malignant neoplasm of vulva, unspecified: Secondary | ICD-10-CM

## 2020-09-03 DIAGNOSIS — Z9079 Acquired absence of other genital organ(s): Secondary | ICD-10-CM

## 2020-09-03 DIAGNOSIS — I10 Essential (primary) hypertension: Secondary | ICD-10-CM | POA: Diagnosis not present

## 2020-09-03 DIAGNOSIS — Z8709 Personal history of other diseases of the respiratory system: Secondary | ICD-10-CM

## 2020-09-03 DIAGNOSIS — G8918 Other acute postprocedural pain: Secondary | ICD-10-CM

## 2020-09-03 LAB — CBC WITH DIFFERENTIAL (CANCER CENTER ONLY)
Abs Immature Granulocytes: 0.04 10*3/uL (ref 0.00–0.07)
Basophils Absolute: 0 10*3/uL (ref 0.0–0.1)
Basophils Relative: 0 %
Eosinophils Absolute: 0.2 10*3/uL (ref 0.0–0.5)
Eosinophils Relative: 2 %
HCT: 31 % — ABNORMAL LOW (ref 36.0–46.0)
Hemoglobin: 9.9 g/dL — ABNORMAL LOW (ref 12.0–15.0)
Immature Granulocytes: 1 %
Lymphocytes Relative: 23 %
Lymphs Abs: 1.9 10*3/uL (ref 0.7–4.0)
MCH: 28.9 pg (ref 26.0–34.0)
MCHC: 31.9 g/dL (ref 30.0–36.0)
MCV: 90.4 fL (ref 80.0–100.0)
Monocytes Absolute: 0.9 10*3/uL (ref 0.1–1.0)
Monocytes Relative: 11 %
Neutro Abs: 5 10*3/uL (ref 1.7–7.7)
Neutrophils Relative %: 63 %
Platelet Count: 223 10*3/uL (ref 150–400)
RBC: 3.43 MIL/uL — ABNORMAL LOW (ref 3.87–5.11)
RDW: 13.1 % (ref 11.5–15.5)
WBC Count: 8.1 10*3/uL (ref 4.0–10.5)
nRBC: 0 % (ref 0.0–0.2)

## 2020-09-03 LAB — COMPREHENSIVE METABOLIC PANEL
ALT: 13 U/L (ref 0–44)
AST: 13 U/L — ABNORMAL LOW (ref 15–41)
Albumin: 2.7 g/dL — ABNORMAL LOW (ref 3.5–5.0)
Alkaline Phosphatase: 38 U/L (ref 38–126)
Anion gap: 9 (ref 5–15)
BUN: 28 mg/dL — ABNORMAL HIGH (ref 8–23)
CO2: 29 mmol/L (ref 22–32)
Calcium: 10.3 mg/dL (ref 8.9–10.3)
Chloride: 103 mmol/L (ref 98–111)
Creatinine, Ser: 1.41 mg/dL — ABNORMAL HIGH (ref 0.44–1.00)
GFR, Estimated: 38 mL/min — ABNORMAL LOW (ref >60)
Glucose, Bld: 138 mg/dL — ABNORMAL HIGH (ref 70–99)
Potassium: 5.1 mmol/L (ref 3.5–5.1)
Sodium: 141 mmol/L (ref 135–145)
Total Bilirubin: 0.3 mg/dL (ref 0.3–1.2)
Total Protein: 6.8 g/dL (ref 6.5–8.1)

## 2020-09-03 MED ORDER — TRAMADOL HCL 50 MG PO TABS: 50.0000 mg | ORAL_TABLET | Freq: Four times a day (QID) | ORAL | 0 refills | Status: DC | PRN

## 2020-09-03 MED ORDER — METOPROLOL TARTRATE 50 MG PO TABS: 50.0000 mg | ORAL_TABLET | Freq: Two times a day (BID) | ORAL | 3 refills | Status: DC

## 2020-09-03 MED ORDER — TORSEMIDE 10 MG PO TABS: 10.0000 mg | ORAL_TABLET | Freq: Two times a day (BID) | ORAL | 3 refills | Status: DC

## 2020-09-03 NOTE — Telephone Encounter (Signed)
Spoke with [place] and her son [name]. CMP and CBC results are back. Per Joylene John, NP white count is in normal range. Kidney function has increased some (1.41). Instructed patient to drink plenty of fluids, at least 64 ounces of water daily. Protein is low at 2.7. Instructed to drink glucerna three times daily. Patient verbalized understanding and will call with questions or concerns.

## 2020-09-03 NOTE — Progress Notes (Signed)
GYN Oncology Post-op Follow up  [name] is a 75 year old female who presents today to the office for a vulvar incision check. She underwent an exam under anesthesia, right sentinel lymph node biopsy by Surgical Oncology at [facility], and an anterior modified radical vulvectomy on 08/22/2020 with Dr. Jeral Pinch at [facility] for squamous cell carcinoma of the vulva. Post-operatively, she experienced post-operative hypotension, hypoxic respiratory failure/ pulmonary edema, monitoring for hx of a fib/CAD/Diastolic heart failure, surgical site infection at the vulvar incision, and acute on chronic CKD. She was discharged home on 3L of home O2 along with home health PT/OT. She has appts with cardiology, PCP, and [facility] pulm but patient would like to see a pulmonologist in [facility] for follow up. Her foley catheter was removed during her admission.  She presents today with her son. She states she is having vulvar soreness/drainage, decreased appetite, and her head "feels heavy at times" and she wants to lay down. She denies nausea or emesis. Even with her appetite decreased, she is trying to eat. She has been using tylenol for her vulvar pain and states it does not offer significant relief. She was given oxycodone post-op from [facility] but states she has not taken it. States she has used tramadol in the past and tolerated it well. States she is voiding without difficulty and feels she is emptying her bladder. Bowels functioning without difficulty. She has been taking the antibiotics prescribed to her from the hospital and reports vulvar drainage. She states she has difficulty using the peri bottle after toileting due to her habitus and difficulty reaching the area. She was discharged on O2 at 3 L and appears to have a follow up with [facility] pulm in Nov. 2022. She is able to walk for short distances with assistance. Her son would like her to see a pulmonologist in [place] if possible. He is also hoping her pathology would be  discussed at today's visit.   Exam: Alert, oriented x 3. Lungs clear and mildly diminished in the bases. Heart in regular rhythm and rate. Abdomen morbidly obese. Vulvar incision with fibrinous drainage present. No erythema noted. Incision is intact with mild superficial separation at the upper aspect of the incision closest to the urethra. Pt refusing to have significant manipulation of the tissue due to tenderness making exam limited. No lower extremity edema noted.   Assessment/Plan: 75 year old female with invasive moderate to poorly differentiated squamous cell carcinoma of the vulva s/p EUA, R SLN Bx, and an anterior modified radical vulvectomy at [facility] with Dr. Berline Lopes on 08/22/2020. Her post-operative course was complicated with the above issues including surgical site infection. She presents today for evaluation of her vulvar incision. It appears the antibiotics are treating the infection adequately. She is advised to complete the full course of antibiotics. Plan for lab work (CBC and Cmet) to assess stability. Advised patient and son to increase intake of protein if possible and to stay hydrated. Advised her case should be discussed at [facility] tumor board soon and they should be contacted with the recommendations. Reportable signs and symptoms reviewed. Discussed peri care. Peri bottle and sitz bath given to patient and discussed ways to address challenges with cleansing the area. All questions answered. Tramadol also prescribed for pain as needed.

## 2020-09-03 NOTE — Progress Notes (Signed)
Primary Physician/Referring:  Deland Pretty, MD  Patient ID: [name], female    DOB: Aug 23, 1945, 75 y.o.   MRN: [id number]  Chief Complaint  Patient presents with   hypotenstion   Follow-up    HPI:    [name]  is a 75 y.o. female  with hypertension, type 2 diabetes mellitus with stage 3 CKD, chronic diastolic heart failure, mild hyperlipidemia on Tricor, nonobstructive CAD (Cath 2019), sick sinus syndrome status post dual-chamber pacemaker 2017, OSA on BiPAP, paroxysmal Afib and atrial flutter/atrial tachycardia.  Her last cardioversion was 09/07/2018 for atrial flutter.  Patient was diagnosed with squamous cell carcinoma of the vulva and underwent colectomy 08/22/2020 at [facility].  Postoperatively patient experienced complex complications including hypotension, hypoxic respiratory failure/pulmonary edema, surgical site infection, acute on chronic CKD.  Patient was discharged home with new supplemental oxygen requirement of 3 L.  During postoperative period patient experienced hypotension requiring pressor support.  She subsequently developed acute on chronic diastolic heart failure requiring diuresis.  Patient now presents for follow-up accompanied by her son.  Patient notes that oncology plans to start radiation therapy as well.  Patient reports that since discharge from [facility] she has been experiencing fatigue, dyspnea, and orthopnea.  She has not been taking torsemide.  She reports compliance with medications as instructed at discharge.  Denies chest pain, palpitations syncope, near syncope, leg edema.  Reports home blood pressure readings 160-170/50s mmHg.  Past Medical History:  Diagnosis Date   Arthritis    Diabetes mellitus without complication ([place])    Dyspnea    Encounter for care of pacemaker 09/03/2018   Family history of kidney cancer    Family history of throat cancer    Family history of uterine cancer    History of chickenpox    Hx of psoriatic arthritis     Hyperlipemia    Hypertension    Hypertension 05/03/2018   Hypothyroidism    Mitral valve disorders(424.0)    Obstructive sleep apnea    Pacemaker    Paroxysmal atrial fibrillation ([place]) 10/11/2016   Peripheral neuropathy    Renal disorder Kidney disease stage2   Thyroid disease hypothyroid   Urine incontinence    Vitamin D deficiency    Past Surgical History:  Procedure Laterality Date   BACK SURGERY     BACK SURGERY     CARDIAC CATHETERIZATION N/A 04/29/2015   Procedure: Temporary Pacemaker;  Surgeon: Adrian Prows, MD;  Location: [name] [facility];  Service: Cardiovascular;  Laterality: N/A;   CARDIOVERSION N/A 09/07/2018   Procedure: CARDIOVERSION;  Surgeon: Adrian Prows, MD;  Location: [place];  Service: Cardiovascular;  Laterality: N/A;   CARDIOVERSION     COLONOSCOPY     EP IMPLANTABLE DEVICE N/A 04/30/2015   Procedure: Pacemaker Implant;  Surgeon: Evans Lance, MD;  Location: [place] [facility];  Service: Cardiovascular;  Laterality: N/A;   INTRAVASCULAR PRESSURE WIRE/FFR STUDY N/A 08/31/2017   Procedure: INTRAVASCULAR PRESSURE WIRE/FFR STUDY;  Surgeon: Nigel Mormon, MD;  Location: [place] [facility];  Service: Cardiovascular;  Laterality: N/A;   LEFT AND RIGHT HEART CATHETERIZATION WITH CORONARY ANGIOGRAM N/A 09/30/2011   Procedure: LEFT AND RIGHT HEART CATHETERIZATION WITH CORONARY ANGIOGRAM;  Surgeon: Laverda Page, MD;  Location: [facility] CATH LAB;  Service: Cardiovascular;  Laterality: N/A;   RIGHT/LEFT HEART CATH AND CORONARY ANGIOGRAPHY N/A 08/31/2017   Procedure: RIGHT/LEFT HEART CATH AND CORONARY ANGIOGRAPHY;  Surgeon: Nigel Mormon, MD;  Location: [place] INVASIVE CV  LAB;  Service: Cardiovascular;  Laterality: N/A;   TOOTH EXTRACTION  10/07/2018   TUBAL LIGATION     VULVA Milagros Loll BIOPSY N/A 07/26/2020   Procedure: VULVAR BIOPSY;  Surgeon: Lafonda Mosses, MD;  Location: [facility];  Service: Gynecology;  Laterality: N/A;   YAG LASER APPLICATION Right  43/32/9518   Procedure: YAG LASER APPLICATION;  Surgeon: Elta Guadeloupe T. Gershon Crane, MD;  Location: [facility];  Service: Ophthalmology;  Laterality: Right;  pt knows to arrive at 84:16   YAG LASER APPLICATION Left 60/63/0160   Procedure: YAG LASER APPLICATION;  Surgeon: Rutherford Guys, MD;  Location: [facility];  Service: Ophthalmology;  Laterality: Left;   Family History  Problem Relation Age of Onset   Arthritis Mother    Diabetes Mother    Heart disease Mother    Hyperlipidemia Mother    Hypertension Mother    Arthritis Father    Asthma Father    Heart attack Father    Hyperlipidemia Father    Hypertension Father    Stroke Father    Arthritis Sister    Diabetes Sister    Hypertension Sister    Arthritis Sister    Diabetes Sister    Hyperlipidemia Sister    Hypertension Sister    Stroke Sister    Ovarian cancer Sister 82   Pancreatic cancer Sister 16   Uterine cancer Sister 29   Hyperlipidemia Sister    Hypertension Sister    Arthritis Brother    Heart attack Brother    Heart disease Brother    Hyperlipidemia Brother    Hypertension Brother    Alcohol abuse Brother    Arthritis Brother    Diabetes Brother    Early death Brother    Heart disease Brother    Hyperlipidemia Brother    Hypertension Brother    Cancer Maternal Aunt 70       unknown type   Throat cancer Maternal Grandfather        hx smoking/drinking   Hypertension Daughter    Cancer Daughter        "female cancer cells"   Kidney cancer Nephew        dx 6s   Colon cancer Neg Hx    Breast cancer Neg Hx    Prostate cancer Neg Hx     Social History   Tobacco Use   Smoking status: Never   Smokeless tobacco: Never  Substance Use Topics   Alcohol use: No    Alcohol/week: 0.0 standard drinks   Marital Status: Married  ROS  Review of Systems  Constitutional: Positive for malaise/fatigue. Negative for weight gain.  Cardiovascular:  Positive for dyspnea on exertion (chronic) and orthopnea. Negative for chest pain,  claudication, leg swelling, near-syncope, palpitations, paroxysmal nocturnal dyspnea and syncope.  Hematologic/Lymphatic: Does not bruise/bleed easily.  Neurological:  Positive for dizziness.  Psychiatric/Behavioral:  Positive for depression.   Objective  Blood pressure (!) 163/53, pulse 62, temperature 98.5 F (36.9 C), height 5' 2.5" (1.588 m), weight 185 lb 3.2 oz (84 kg), SpO2 (!) 89 %.  Vitals with BMI 09/03/2020 09/03/2020 09/03/2020  Height - 5' 2.5" -  Weight - 185 lbs 3 oz 182 lbs 10 oz  BMI - 10.93 23.55  Systolic 732 202 542  Diastolic 53 48 45  Pulse 62 60 61     Physical Exam Vitals reviewed.  Constitutional:      Appearance: She is well-developed. She is obese.  Neck:     Vascular: No JVD.  Cardiovascular:     Rate and Rhythm: Normal rate and regular rhythm.     Pulses: Intact distal pulses.          Carotid pulses are 2+ on the right side and 2+ on the left side with bruit.      Popliteal pulses are 1+ on the right side and 1+ on the left side.       Dorsalis pedis pulses are 1+ on the right side and 1+ on the left side.       Posterior tibial pulses are 1+ on the right side and 1+ on the left side.     Heart sounds: S1 normal and S2 normal. Murmur heard.  Midsystolic murmur is present with a grade of 2/6 at the upper right sternal border and apex.    No gallop.  Pulmonary:     Effort: Pulmonary effort is normal. No accessory muscle usage or respiratory distress.     Breath sounds: Normal breath sounds. No wheezing, rhonchi or rales.  Musculoskeletal:        General: Normal range of motion.     Right lower leg: Edema (minimal) present.     Left lower leg: Edema (minimal) present.  Neurological:     Mental Status: She is alert.   Laboratory examination:   Recent Labs    07/26/20 1115 09/03/20 1224  NA 139 141  K 4.0 5.1  CL 105 103  CO2 27 29  GLUCOSE 158* 138*  BUN 22 28*  CREATININE 1.12* 1.41*  CALCIUM 10.3 10.3  GFRNONAA 51* 38*   estimated  creatinine clearance is 34 mL/min (A) (by C-G formula based on SCr of 1.41 mg/dL (H)).  CMP Latest Ref Rng & Units 09/03/2020 07/26/2020 08/26/2019  Glucose 70 - 99 mg/dL 138(H) 158(H) 176(H)  BUN 8 - 23 mg/dL 28(H) 22 30(H)  Creatinine 0.44 - 1.00 mg/dL 1.41(H) 1.12(H) 1.34(H)  Sodium 135 - 145 mmol/L 141 139 136  Potassium 3.5 - 5.1 mmol/L 5.1 4.0 4.7  Chloride 98 - 111 mmol/L 103 105 100  CO2 22 - 32 mmol/L 29 27 27   Calcium 8.9 - 10.3 mg/dL 10.3 10.3 10.3  Total Protein 6.5 - 8.1 g/dL 6.8 - -  Total Bilirubin 0.3 - 1.2 mg/dL 0.3 - -  Alkaline Phos 38 - 126 U/L 38 - -  AST 15 - 41 U/L 13(L) - -  ALT 0 - 44 U/L 13 - -   CBC Latest Ref Rng & Units 09/03/2020 07/26/2020 08/26/2019  WBC 4.0 - 10.5 K/uL 8.1 7.9 7.1  Hemoglobin 12.0 - 15.0 g/dL 9.9(L) 13.4 13.6  Hematocrit 36.0 - 46.0 % 31.0(L) 41.7 43.0  Platelets 150 - 400 K/uL 223 148(L) 155   Lipid Panel     Component Value Date/Time   CHOL 168 08/29/2017 0328   TRIG 544 (H) 08/29/2017 0328   HDL 19 (L) 08/29/2017 0328   CHOLHDL 8.8 08/29/2017 0328   VLDL UNABLE TO CALCULATE IF TRIGLYCERIDE OVER 400 mg/dL 08/29/2017 0328   LDLCALC UNABLE TO CALCULATE IF TRIGLYCERIDE OVER 400 mg/dL 08/29/2017 0328   HEMOGLOBIN A1C Lab Results  Component Value Date   HGBA1C 7.8 (H) 07/26/2020   MPG 177.16 07/26/2020   TSH No results for input(s): TSH in the last 8760 hours.   External labs:  10/25/2019: Sodium 142, glucose 135, BUN 24, GFR 42, potassium 5.5, AST 22, ALT 37, alk phos 25 A1c 6.9% Total cholesterol 177, triglycerides 240, LDL 100, HDL 29 TSH  3.61  Cholesterol, total 168.000 m 08/29/2017 HDL 17.000 mg 06/09/2019 LDL-C 100.000 ca 06/09/2019 Triglycerides 343.000 m 06/09/2019  A1C 7.200 % 06/24/2019 TSH 5.100 08/31/2019  Hemoglobin 13.600 g/d 08/26/2019 Platelets 155.000 K/ 08/26/2019  Creatinine, Serum 1.340 mg/ 08/26/2019 Potassium 4.700 mm 08/26/2019 Magnesium 1.700 MG/ 04/24/2015 ALT (SGPT) 21.000 IU/  06/24/2019  05/21/2018: HbA1c 6.7. Lipid Panel: Chol 146, Trig 321, HDL 17, LDL 65, VLDL 64. ALK Ph 32.   08/28/17: TSH Normal  Allergies   Allergies  Allergen Reactions   Hydrocodone Other (See Comments)    Lethargic    Invokana [Canagliflozin] Palpitations and Other (See Comments)    Made heart race   Oxycodone Other (See Comments)    Lethargic     Medications Prior to Visit:   Outpatient Medications Prior to Visit  Medication Sig Dispense Refill   ELIQUIS 5 MG TABS tablet Take 1 tablet by mouth twice daily 180 tablet 0   EUTHYROX 88 MCG tablet Take 88 mcg by mouth daily.     fenofibrate 160 MG tablet Take 160 mg by mouth daily with supper.      insulin aspart protamine- aspart (NOVOLOG MIX 70/30) (70-30) 100 UNIT/ML injection Inject 35-45 Units into the skin See admin instructions. Inject 45 units into the skin with breakfast, 35 units with supper (may adjust based on blood sugar readings)     isosorbide mononitrate (IMDUR) 60 MG 24 hr tablet Take 1 tablet (60 mg total) by mouth daily. 90 tablet 3   lidocaine (XYLOCAINE) 5 % ointment Apply 1 application topically 3 (three) times daily as needed. 35.44 g 2   nitroGLYCERIN (NITROSTAT) 0.4 MG SL tablet Place 1 tablet (0.4 mg total) under the tongue every 5 (five) minutes as needed for chest pain. 25 tablet 1   nystatin cream (MYCOSTATIN) APPLY  CREAM TOPICALLY TWICE DAILY AS NEEDED FOR  IRRIATATION  OF  AFFECTED  AREA 30 g 0   olmesartan-hydrochlorothiazide (BENICAR HCT) 40-25 MG tablet Take 1 tablet by mouth daily. 90 tablet 3   spironolactone (ALDACTONE) 25 MG tablet Take 1 tablet by mouth once daily (Patient taking differently: Take 25 mg by mouth daily.) 90 tablet 0   SURE COMFORT INSULIN SYRINGE 31G X 5/16" 0.5 ML MISC      triamcinolone ointment (KENALOG) 0.5 % Apply topically 2 (two) times daily. (Patient taking differently: Apply 1 application topically 2 (two) times daily.) 30 g 3   carvedilol (COREG) 25 MG tablet Take 25  mg by mouth 2 (two) times daily.     torsemide (DEMADEX) 5 MG tablet Take 5 mg by mouth daily as needed (swelling).     amiodarone (PACERONE) 200 MG tablet Take 1/2 (one-half) tablet by mouth once daily (Patient taking differently: Take 100 mg by mouth daily.) 45 tablet 3   empagliflozin (JARDIANCE) 25 MG TABS tablet Take 1 tablet (25 mg total) by mouth daily before breakfast. (Patient not taking: Reported on 09/03/2020) 30 tablet 6   Vibegron (GEMTESA) 75 MG TABS Take 75 mg by mouth daily. (Patient not taking: Reported on 09/03/2020)     metoprolol tartrate (LOPRESSOR) 50 MG tablet Take 1 tablet (50 mg total) by mouth 2 (two) times daily. (Patient not taking: Reported on 09/03/2020) 180 tablet 3   No facility-administered medications prior to visit.   Final Medications at End of Visit    Current Meds  Medication Sig   ELIQUIS 5 MG TABS tablet Take 1 tablet by mouth twice daily   EUTHYROX 88  MCG tablet Take 88 mcg by mouth daily.   fenofibrate 160 MG tablet Take 160 mg by mouth daily with supper.    insulin aspart protamine- aspart (NOVOLOG MIX 70/30) (70-30) 100 UNIT/ML injection Inject 35-45 Units into the skin See admin instructions. Inject 45 units into the skin with breakfast, 35 units with supper (may adjust based on blood sugar readings)   isosorbide mononitrate (IMDUR) 60 MG 24 hr tablet Take 1 tablet (60 mg total) by mouth daily.   lidocaine (XYLOCAINE) 5 % ointment Apply 1 application topically 3 (three) times daily as needed.   nitroGLYCERIN (NITROSTAT) 0.4 MG SL tablet Place 1 tablet (0.4 mg total) under the tongue every 5 (five) minutes as needed for chest pain.   nystatin cream (MYCOSTATIN) APPLY  CREAM TOPICALLY TWICE DAILY AS NEEDED FOR  IRRIATATION  OF  AFFECTED  AREA   olmesartan-hydrochlorothiazide (BENICAR HCT) 40-25 MG tablet Take 1 tablet by mouth daily.   spironolactone (ALDACTONE) 25 MG tablet Take 1 tablet by mouth once daily (Patient taking differently: Take 25 mg by  mouth daily.)   SURE COMFORT INSULIN SYRINGE 31G X 5/16" 0.5 ML MISC    triamcinolone ointment (KENALOG) 0.5 % Apply topically 2 (two) times daily. (Patient taking differently: Apply 1 application topically 2 (two) times daily.)   [DISCONTINUED] carvedilol (COREG) 25 MG tablet Take 25 mg by mouth 2 (two) times daily.   [DISCONTINUED] torsemide (DEMADEX) 5 MG tablet Take 5 mg by mouth daily as needed (swelling).   Radiology:   No results found.  Cardiac Studies:   Carotid Doppler 02/10/2014: No evidence of hemodynamically significant stenosis in the bilateral carotid bifurcation vessels. There is mild evidence of homogeneous plaque in bilateral carotid arteries. No significant change from 10/13/2011.   Lexiscan myoview stress test 06/15/2017: 1. Lexiscan stress test was performed. Exercise capacity was not assessed. Stress symptoms included dizziness. Peak blood pressure 158/66 mmHg. Stress EKG is non diagnostic for ischemia as it is a pharmacologic stress. In addition, it demonstrated atrial pacing and incomplete LBBB. 2. The overall quality of the study is excellent. There is no evidence of abnormal lung activity. Stress and rest SPECT images demonstrate homogeneous tracer distribution throughout the myocardium. Gated SPECT imaging reveals normal myocardial thickening and wall motion. The left ventricular ejection fraction was normal (71%). 3. Low risk study.   Sleep Study 08/11/2017: Positive for Complex Sleep Apnea; On CPAP    R&LHC 08/31/2017: LCx: Nondominant. AV grove LCx 50-60% diffuse disease. RCA: Large dominant. Ostial PDA 50% stenosis, FFR 0.96. No change from 09/30/2011. RA: 5 mmHg RV: 45/4 mmHg. PA: 40/12 mmHg. Mean PA 29 mmHg PCWP: 16 mmHg LV: 130/3 mmHg, LVEDP 13 mmHg CO: 3.6 L/min. CI 1.9 L/min/m2  Direct current cardioversion 09/07/2018: Indication symptomatic A. Flutter. Procedure: Using 50 mg of IV Propofol and 20 IV Lidocaine (for reducing venous pain) for achieving  deep sedation, synchronized direct current cardioversion performed. Patient was delivered with 120 Joules of electricity X 1 with success to A-Paced Rhythm. Patient tolerated the procedure well. No immediate complication noted.   Echocardiogram 11/03/2018: Left ventricle cavity is normal in size. Moderate concentric hypertrophy of the left ventricle. Normal LV systolic function with EF 56%. Normal global wall motion. Unable to evaluate diastolic function due to E/A fusion.  Left atrial cavity is severely dilated. Trace aortic regurgitation. Moderate (Grade III) mitral regurgitation. Mild mitral valve leaflet thickening. Moderate tricuspid regurgitation. Estimated pulmonary artery systolic pressure is 40 mmHg.  No significant change compared to  previous study on 07/01/2017.   External echocardiogram 08/23/2020:   1. The left ventricle is normal in size with mildly increased wall thickness.    2. The left ventricular systolic function is normal, LVEF is visually estimated at > 55%. There is grade III diastolic dysfunction (severely elevated filling pressure).    3. Mitral annular calcification is present (mild).    4. The mitral valve leaflets are mildly thickened with normal leaflet mobility.    5. There is mild mitral valve regurgitation.    6. The aortic valve is trileaflet with mildly thickened leaflets with normal excursion.    7. The left atrium is mildly dilated in size.    8. The right ventricle is upper normal in size, with normal systolic function.   Pacemaker:  Remote dual-chamber pacemaker transmission 06/20/2020: Longevity 9 years. Lead impedance and thresholds within normal limits. There was no mode switch, no ventricular high rate episodes. AP 99 %, VP 6%. Normal pacemaker function.  Scheduled In office pacemaker check 08/30/2018:  There were 6 AHR Episodes Since 08/08/2018, EGM recordings reveal maximum a rate between 180-201 bpm,, The longest His persistent since 08/30/2018,Atrial  rate 180, ventricular rate 1 28 bpm, EGM's suggest atrial tachycardia/AFL. There was 0.4% truly due to atrial arrhythmia burden, pacemaker thresholds and impedance stable. Longevity 8.5, 12 years  EKG:   05/18/2020: Atrial paced rhythm with first-degree AV block at a rate of 60 bpm.  Left axis, left anterior fascicular block.  Poor R wave progression, cannot exclude anteroseptal infarct old.  IVCD.  Compared to EKG 11/10/2019, no significant change.  11/10/2019: Atrially paced rhythm with first-degree AV block, ventricularly sensed.  Ventricular rate 60 bpm.  Left axis deviation, left intrafascicular block.  Anteroseptal infarct old.  IVCD, borderline criteria for LVH.  Nonspecific T abnormality.  No significant change from 01/11/2019.  08/30/2018: Atypical atrial flutter with 3:1 conduction, V rate 110 bpm, left axis deviation. LBBB.   Assessment     ICD-10-CM   1. Chronic diastolic heart failure (HCC)  T90.30 Basic metabolic panel    Brain natriuretic peptide    2. Left carotid bruit  R09.89 PCV CAROTID DUPLEX (BILATERAL)    3. Essential hypertension  I10     4. Paroxysmal atrial fibrillation (HCC)  I48.0     5. Coronary artery disease involving native coronary artery of native heart without angina pectoris  I25.10       Meds ordered this encounter  Medications   torsemide (DEMADEX) 10 MG tablet    Sig: Take 1 tablet (10 mg total) by mouth 2 (two) times daily.    Dispense:  30 tablet    Refill:  3   metoprolol tartrate (LOPRESSOR) 50 MG tablet    Sig: Take 1 tablet (50 mg total) by mouth 2 (two) times daily.    Dispense:  180 tablet    Refill:  3     Medications Discontinued During This Encounter  Medication Reason   metoprolol tartrate (LOPRESSOR) 50 MG tablet Change in therapy   carvedilol (COREG) 25 MG tablet Change in therapy   torsemide (DEMADEX) 5 MG tablet Reorder    This patients CHA2DS2-VASc Score 7 (CHF, HTN, DM, Vasc, Age, F) and yearly risk of stroke >9.8%.    Recommendations:   [name]  is a 75 y.o. female  with hypertension, type 2 diabetes mellitus with stage 3 CKD, chronic diastolic heart failure, mild hyperlipidemia on fenobibrate, nonobstructive CAD (Cath 2019), sick sinus syndrome status post  dual-chamber pacemaker 2017, OSA on BiPAP, paroxysmal Afib and atrial flutter/atrial tachycardia.  Her last cardioversion was 09/07/2018 for atrial flutter.  Patient was diagnosed with squamous cell carcinoma of the vulva and underwent colectomy 08/22/2020 at [facility].  Postoperatively patient experienced complex complications including hypotension, hypoxic respiratory failure/pulmonary edema, surgical site infection, acute on chronic CKD.  Patient was discharged home with new supplemental oxygen requirement of 3 L.  During postoperative period patient experienced hypotension requiring pressor support.  She subsequently developed acute on chronic diastolic heart failure requiring diuresis.  Patient now presents for follow-up accompanied by her son.  Personally reviewed external records from patient's recent hospital admission to [facility].  Reviewed and discussed with patient results of echocardiogram during that hospitalization, details above.  Also reviewed and discussed recent pacemaker transmission, details above.  Given patient's continued symptoms of orthopnea, dyspnea recommend she take torsemide 10 mg p.o. twice daily, will obtain BNP and BMP.  In regard to hypertension, patient's blood pressure had previously been well controlled with metoprolol.  We will therefore switch patient from carvedilol to metoprolol tartrate 50 mg p.o. twice daily.  Patient will continue to monitor blood pressure on a daily basis and bring a written log to her next appointment.  We will continue other cardiac medications including amiodarone, Eliquis, Imdur, Benicar/HCTZ, and spironolactone.  Patient is tolerating anticoagulation without bleeding diathesis.   Patient's oxygen  saturation is low during today's office visit, however she did not bring supplemental oxygen with her today.  Advised patient that given her new baseline requirement for oxygen she must take it with her even when she is out of the house, patient verbalized understanding agreement.  Also on physical exam patient has prominent left carotid bruit, will obtain repeat carotid ultrasound to reevaluate for stenosis.  Follow-up in 1 week, sooner if needed, for acute on chronic diastolic heart failure.  Patient was seen in collaboration with Dr. Einar Gip and he is in agreement with the plan.    [name] Berthold, PA-C 09/04/2020, 9:59 AM Office: [phone]

## 2020-09-03 NOTE — Patient Instructions (Signed)
Your incision is healing. Continue taking the Bactrim. We will check labs today and contact you with the results. Plan to follow up as scheduled or sooner if needed.

## 2020-09-04 ENCOUNTER — Telehealth: Payer: Self-pay | Admitting: Oncology

## 2020-09-04 DIAGNOSIS — C519 Malignant neoplasm of vulva, unspecified: Secondary | ICD-10-CM | POA: Diagnosis not present

## 2020-09-04 DIAGNOSIS — N189 Chronic kidney disease, unspecified: Secondary | ICD-10-CM | POA: Diagnosis not present

## 2020-09-04 DIAGNOSIS — I2510 Atherosclerotic heart disease of native coronary artery without angina pectoris: Secondary | ICD-10-CM | POA: Diagnosis not present

## 2020-09-04 DIAGNOSIS — I5032 Chronic diastolic (congestive) heart failure: Secondary | ICD-10-CM | POA: Diagnosis not present

## 2020-09-04 DIAGNOSIS — I130 Hypertensive heart and chronic kidney disease with heart failure and stage 1 through stage 4 chronic kidney disease, or unspecified chronic kidney disease: Secondary | ICD-10-CM | POA: Diagnosis not present

## 2020-09-04 DIAGNOSIS — I480 Paroxysmal atrial fibrillation: Secondary | ICD-10-CM | POA: Diagnosis not present

## 2020-09-04 DIAGNOSIS — E1142 Type 2 diabetes mellitus with diabetic polyneuropathy: Secondary | ICD-10-CM | POA: Diagnosis not present

## 2020-09-04 DIAGNOSIS — G4733 Obstructive sleep apnea (adult) (pediatric): Secondary | ICD-10-CM | POA: Diagnosis not present

## 2020-09-04 DIAGNOSIS — Z483 Aftercare following surgery for neoplasm: Secondary | ICD-10-CM | POA: Diagnosis not present

## 2020-09-04 DIAGNOSIS — E039 Hypothyroidism, unspecified: Secondary | ICD-10-CM | POA: Diagnosis not present

## 2020-09-04 NOTE — Telephone Encounter (Signed)
Called [name] (son).  He wold prefer a referral to a pulmonologist in [place].

## 2020-09-05 DIAGNOSIS — E1142 Type 2 diabetes mellitus with diabetic polyneuropathy: Secondary | ICD-10-CM | POA: Diagnosis not present

## 2020-09-05 DIAGNOSIS — I5032 Chronic diastolic (congestive) heart failure: Secondary | ICD-10-CM | POA: Diagnosis not present

## 2020-09-05 DIAGNOSIS — N189 Chronic kidney disease, unspecified: Secondary | ICD-10-CM | POA: Diagnosis not present

## 2020-09-05 DIAGNOSIS — E039 Hypothyroidism, unspecified: Secondary | ICD-10-CM | POA: Diagnosis not present

## 2020-09-05 DIAGNOSIS — I2510 Atherosclerotic heart disease of native coronary artery without angina pectoris: Secondary | ICD-10-CM | POA: Diagnosis not present

## 2020-09-05 DIAGNOSIS — C519 Malignant neoplasm of vulva, unspecified: Secondary | ICD-10-CM | POA: Diagnosis not present

## 2020-09-05 DIAGNOSIS — Z483 Aftercare following surgery for neoplasm: Secondary | ICD-10-CM | POA: Diagnosis not present

## 2020-09-05 DIAGNOSIS — I130 Hypertensive heart and chronic kidney disease with heart failure and stage 1 through stage 4 chronic kidney disease, or unspecified chronic kidney disease: Secondary | ICD-10-CM | POA: Diagnosis not present

## 2020-09-05 DIAGNOSIS — G4733 Obstructive sleep apnea (adult) (pediatric): Secondary | ICD-10-CM | POA: Diagnosis not present

## 2020-09-05 DIAGNOSIS — I480 Paroxysmal atrial fibrillation: Secondary | ICD-10-CM | POA: Diagnosis not present

## 2020-09-06 ENCOUNTER — Other Ambulatory Visit: Payer: Self-pay

## 2020-09-06 ENCOUNTER — Ambulatory Visit: Payer: Medicare HMO

## 2020-09-06 DIAGNOSIS — I5032 Chronic diastolic (congestive) heart failure: Secondary | ICD-10-CM | POA: Diagnosis not present

## 2020-09-06 DIAGNOSIS — R0989 Other specified symptoms and signs involving the circulatory and respiratory systems: Secondary | ICD-10-CM | POA: Diagnosis not present

## 2020-09-06 DIAGNOSIS — I6523 Occlusion and stenosis of bilateral carotid arteries: Secondary | ICD-10-CM | POA: Diagnosis not present

## 2020-09-06 DIAGNOSIS — Z1379 Encounter for other screening for genetic and chromosomal anomalies: Secondary | ICD-10-CM | POA: Insufficient documentation

## 2020-09-07 LAB — BASIC METABOLIC PANEL
BUN/Creatinine Ratio: 27 (ref 12–28)
BUN: 40 mg/dL — ABNORMAL HIGH (ref 8–27)
CO2: 30 mmol/L — ABNORMAL HIGH (ref 20–29)
Calcium: 10 mg/dL (ref 8.7–10.3)
Chloride: 92 mmol/L — ABNORMAL LOW (ref 96–106)
Creatinine, Ser: 1.48 mg/dL — ABNORMAL HIGH (ref 0.57–1.00)
Glucose: 151 mg/dL — ABNORMAL HIGH (ref 65–99)
Potassium: 4.9 mmol/L (ref 3.5–5.2)
Sodium: 139 mmol/L (ref 134–144)
eGFR: 36 mL/min/{1.73_m2} — ABNORMAL LOW (ref >59)

## 2020-09-07 LAB — BRAIN NATRIURETIC PEPTIDE: BNP: 208.5 pg/mL — ABNORMAL HIGH (ref 0.0–100.0)

## 2020-09-10 ENCOUNTER — Inpatient Hospital Stay: Payer: Medicare HMO | Admitting: Gynecologic Oncology

## 2020-09-10 NOTE — Progress Notes (Signed)
Called pt, daughter answered and informed her that you will go over her results tomorrow. Pt daughter understood.

## 2020-09-10 NOTE — Progress Notes (Signed)
Mild progression of carotid stenosis will discuss further at next visit.

## 2020-09-11 ENCOUNTER — Encounter: Payer: Self-pay | Admitting: Genetic Counselor

## 2020-09-11 ENCOUNTER — Ambulatory Visit: Payer: Self-pay | Admitting: Genetic Counselor

## 2020-09-11 ENCOUNTER — Other Ambulatory Visit: Payer: Self-pay

## 2020-09-11 ENCOUNTER — Telehealth: Payer: Self-pay | Admitting: Genetic Counselor

## 2020-09-11 ENCOUNTER — Ambulatory Visit: Payer: Medicare HMO | Admitting: Student

## 2020-09-11 ENCOUNTER — Encounter: Payer: Self-pay | Admitting: Student

## 2020-09-11 VITALS — BP 128/48 | HR 61 | Temp 98.0°F | Resp 17 | Ht 62.5 in | Wt 177.8 lb

## 2020-09-11 DIAGNOSIS — I5032 Chronic diastolic (congestive) heart failure: Secondary | ICD-10-CM | POA: Diagnosis not present

## 2020-09-11 DIAGNOSIS — R0989 Other specified symptoms and signs involving the circulatory and respiratory systems: Secondary | ICD-10-CM | POA: Diagnosis not present

## 2020-09-11 DIAGNOSIS — I6523 Occlusion and stenosis of bilateral carotid arteries: Secondary | ICD-10-CM | POA: Diagnosis not present

## 2020-09-11 DIAGNOSIS — I10 Essential (primary) hypertension: Secondary | ICD-10-CM

## 2020-09-11 DIAGNOSIS — Z1379 Encounter for other screening for genetic and chromosomal anomalies: Secondary | ICD-10-CM

## 2020-09-11 NOTE — Progress Notes (Signed)
Primary Physician/Referring:  Deland Pretty, MD  Patient ID: [name], female    DOB: July 18, 1945, 75 y.o.   MRN: [id number]  Chief Complaint  Patient presents with   Follow-up    1 WEEK   HF   Hypertension    HPI:    [name]  is a 75 y.o. female  with hypertension, type 2 diabetes mellitus with stage 3 CKD, chronic diastolic heart failure, mild hyperlipidemia on Tricor, nonobstructive CAD (Cath 2019), sick sinus syndrome status post dual-chamber pacemaker 2017, OSA on BiPAP, paroxysmal Afib and atrial flutter/atrial tachycardia.  Her last cardioversion was 09/07/2018 for atrial flutter.  Patient was diagnosed with squamous cell carcinoma of the vulva and underwent colectomy 08/22/2020 at [facility].  Postoperatively patient experienced complex complications including hypotension, hypoxic respiratory failure/pulmonary edema, surgical site infection, acute on chronic CKD.  Patient was discharged home with new supplemental oxygen requirement of 3 L.  During postoperative period patient experienced hypotension requiring pressor support.  She subsequently developed acute on chronic diastolic heart failure requiring diuresis.    Patient presents for 1 week follow-up of acute on chronic diastolic heart failure.  She was seen in our office 09/03/2020 with acute heart failure.  Patient was started on torsemide 10 mg p.o. twice daily and switch from carvedilol to metoprolol given elevated blood pressure.  Also obtained carotid artery duplex given left carotid bruit.  Patient reports dyspnea has significantly improved since last visit.  However her primary concern today is difficulty sleeping and fatigue.  She has also noticed that her supplemental oxygen requirements have decreased.  Unfortunately she does not bring with her blood pressure readings from home to review and cannot remember her home blood pressure readings.  Patient is accompanied by her daughter at today's visit.  Past Medical  History:  Diagnosis Date   Arthritis    Diabetes mellitus without complication ([place])    Dyspnea    Encounter for care of pacemaker 09/03/2018   Family history of kidney cancer    Family history of throat cancer    Family history of uterine cancer    History of chickenpox    Hx of psoriatic arthritis    Hyperlipemia    Hypertension    Hypertension 05/03/2018   Hypothyroidism    Mitral valve disorders(424.0)    Obstructive sleep apnea    Pacemaker    Paroxysmal atrial fibrillation ([place]) 10/11/2016   Peripheral neuropathy    Renal disorder Kidney disease stage2   Thyroid disease hypothyroid   Urine incontinence    Vitamin D deficiency    Past Surgical History:  Procedure Laterality Date   BACK SURGERY     BACK SURGERY     CARDIAC CATHETERIZATION N/A 04/29/2015   Procedure: Temporary Pacemaker;  Surgeon: Adrian Prows, MD;  Location: [place] [facility];  Service: Cardiovascular;  Laterality: N/A;   CARDIOVERSION N/A 09/07/2018   Procedure: CARDIOVERSION;  Surgeon: Adrian Prows, MD;  Location: [facility];  Service: Cardiovascular;  Laterality: N/A;   CARDIOVERSION     COLONOSCOPY     EP IMPLANTABLE DEVICE N/A 04/30/2015   Procedure: Pacemaker Implant;  Surgeon: Evans Lance, MD;  Location: [place] [facility];  Service: Cardiovascular;  Laterality: N/A;   INTRAVASCULAR PRESSURE WIRE/FFR STUDY N/A 08/31/2017   Procedure: INTRAVASCULAR PRESSURE WIRE/FFR STUDY;  Surgeon: Nigel Mormon, MD;  Location: [place] [facility];  Service: Cardiovascular;  Laterality: N/A;   LEFT AND RIGHT HEART CATHETERIZATION WITH CORONARY ANGIOGRAM N/A  09/30/2011   Procedure: LEFT AND RIGHT HEART CATHETERIZATION WITH CORONARY ANGIOGRAM;  Surgeon: Laverda Page, MD;  Location: [facility] [place] C[facility];  Service: Cardiovascular;  Laterality: N/A;   RIGHT/LEFT HEART CATH AND CORONARY ANGIOGRAPHY N/A 08/31/2017   Procedure: RIGHT/LEFT HEART CATH AND CORONARY ANGIOGRAPHY;  Surgeon: Nigel Mormon, MD;   Location: [place] [facility];  Service: Cardiovascular;  Laterality: N/A;   TOOTH EXTRACTION  10/07/2018   TUBAL LIGATION     VULVA Milagros Loll BIOPSY N/A 07/26/2020   Procedure: VULVAR BIOPSY;  Surgeon: Lafonda Mosses, MD;  Location: [facility];  Service: Gynecology;  Laterality: N/A;   YAG LASER APPLICATION Right 68/34/1962   Procedure: YAG LASER APPLICATION;  Surgeon: Elta Guadeloupe T. Gershon Crane, MD;  Location: [facility];  Service: Ophthalmology;  Laterality: Right;  pt knows to arrive at 22:97   YAG LASER APPLICATION Left 98/92/1194   Procedure: YAG LASER APPLICATION;  Surgeon: Rutherford Guys, MD;  Location: [facility];  Service: Ophthalmology;  Laterality: Left;   Family History  Problem Relation Age of Onset   Arthritis Mother    Diabetes Mother    Heart disease Mother    Hyperlipidemia Mother    Hypertension Mother    Arthritis Father    Asthma Father    Heart attack Father    Hyperlipidemia Father    Hypertension Father    Stroke Father    Arthritis Sister    Diabetes Sister    Hypertension Sister    Arthritis Sister    Diabetes Sister    Hyperlipidemia Sister    Hypertension Sister    Stroke Sister    Ovarian cancer Sister 44   Pancreatic cancer Sister 69   Uterine cancer Sister 34   Hyperlipidemia Sister    Hypertension Sister    Arthritis Brother    Heart attack Brother    Heart disease Brother    Hyperlipidemia Brother    Hypertension Brother    Alcohol abuse Brother    Arthritis Brother    Diabetes Brother    Early death Brother    Heart disease Brother    Hyperlipidemia Brother    Hypertension Brother    Cancer Maternal Aunt 70       unknown type   Throat cancer Maternal Grandfather        hx smoking/drinking   Hypertension Daughter    Cancer Daughter        "female cancer cells"   Kidney cancer Nephew        dx 18s   Colon cancer Neg Hx    Breast cancer Neg Hx    Prostate cancer Neg Hx     Social History   Tobacco Use   Smoking status: Never   Smokeless  tobacco: Never  Substance Use Topics   Alcohol use: No    Alcohol/week: 0.0 standard drinks   Marital Status: Married  ROS  Review of Systems  Constitutional: Positive for malaise/fatigue. Negative for weight gain.  Cardiovascular:  Positive for dyspnea on exertion (improved). Negative for chest pain, claudication, leg swelling, near-syncope, orthopnea (resolved), palpitations, paroxysmal nocturnal dyspnea and syncope.  Hematologic/Lymphatic: Does not bruise/bleed easily.  Neurological:  Positive for dizziness (upon standing).  Objective  Blood pressure (!) 128/48, pulse 61, temperature 98 F (36.7 C), temperature source Temporal, resp. rate 17, height 5' 2.5" (1.588 m), weight 177 lb 12.8 oz (80.6 kg), SpO2 95 %.  Vitals with BMI 09/11/2020 09/11/2020 09/11/2020  Height - - 5' 2.5"  Weight - -  177 lbs 13 oz  BMI - - 72.09  Systolic [phone]  Diastolic 48 38 49  Pulse - 61 60     Physical Exam Vitals reviewed.  Constitutional:      Appearance: She is well-developed. She is obese.  Neck:     Vascular: No JVD.  Cardiovascular:     Rate and Rhythm: Normal rate and regular rhythm.     Pulses: Intact distal pulses.          Carotid pulses are 2+ on the right side and 2+ on the left side with bruit.      Popliteal pulses are 1+ on the right side and 1+ on the left side.       Dorsalis pedis pulses are 1+ on the right side and 1+ on the left side.       Posterior tibial pulses are 1+ on the right side and 1+ on the left side.     Heart sounds: S1 normal and S2 normal. Murmur heard.  Midsystolic murmur is present with a grade of 2/6 at the upper right sternal border and apex.    No gallop.  Pulmonary:     Effort: Pulmonary effort is normal. No accessory muscle usage or respiratory distress.     Breath sounds: Normal breath sounds. No wheezing, rhonchi or rales.  Musculoskeletal:        General: Normal range of motion.     Right lower leg: No edema.     Left lower leg: No edema.   Neurological:     Mental Status: She is alert.   Laboratory examination:   Recent Labs    07/26/20 1115 09/03/20 1224 09/06/20 1323  NA 139 141 139  K 4.0 5.1 4.9  CL 105 103 92*  CO2 27 29 30*  GLUCOSE 158* 138* 151*  BUN 22 28* 40*  CREATININE 1.12* 1.41* 1.48*  CALCIUM 10.3 10.3 10.0  GFRNONAA 51* 38*  --    estimated creatinine clearance is 31.7 mL/min (A) (by C-G formula based on SCr of 1.48 mg/dL (H)).  CMP Latest Ref Rng & Units 09/06/2020 09/03/2020 07/26/2020  Glucose 65 - 99 mg/dL 151(H) 138(H) 158(H)  BUN 8 - 27 mg/dL 40(H) 28(H) 22  Creatinine 0.57 - 1.00 mg/dL 1.48(H) 1.41(H) 1.12(H)  Sodium 134 - 144 mmol/L 139 141 139  Potassium 3.5 - 5.2 mmol/L 4.9 5.1 4.0  Chloride 96 - 106 mmol/L 92(L) 103 105  CO2 20 - 29 mmol/L 30(H) 29 27  Calcium 8.7 - 10.3 mg/dL 10.0 10.3 10.3  Total Protein 6.5 - 8.1 g/dL - 6.8 -  Total Bilirubin 0.3 - 1.2 mg/dL - 0.3 -  Alkaline Phos 38 - 126 U/L - 38 -  AST 15 - 41 U/L - 13(L) -  ALT 0 - 44 U/L - 13 -   CBC Latest Ref Rng & Units 09/03/2020 07/26/2020 08/26/2019  WBC 4.0 - 10.5 K/uL 8.1 7.9 7.1  Hemoglobin 12.0 - 15.0 g/dL 9.9(L) 13.4 13.6  Hematocrit 36.0 - 46.0 % 31.0(L) 41.7 43.0  Platelets 150 - 400 K/uL 223 148(L) 155   Lipid Panel     Component Value Date/Time   CHOL 168 08/29/2017 0328   TRIG 544 (H) 08/29/2017 0328   HDL 19 (L) 08/29/2017 0328   CHOLHDL 8.8 08/29/2017 0328   VLDL UNABLE TO CALCULATE IF TRIGLYCERIDE OVER 400 mg/dL 08/29/2017 0328   LDLCALC UNABLE TO CALCULATE IF TRIGLYCERIDE OVER 400 mg/dL 08/29/2017 0328  HEMOGLOBIN A1C Lab Results  Component Value Date   HGBA1C 7.8 (H) 07/26/2020   MPG 177.16 07/26/2020   TSH No results for input(s): TSH in the last 8760 hours.   External labs:  10/25/2019: Sodium 142, glucose 135, BUN 24, GFR 42, potassium 5.5, AST 22, ALT 37, alk phos 25 A1c 6.9% Total cholesterol 177, triglycerides 240, LDL 100, HDL 29 TSH 3.61  Cholesterol, total 168.000 m  08/29/2017 HDL 17.000 mg 06/09/2019 LDL-C 100.000 ca 06/09/2019 Triglycerides 343.000 m 06/09/2019  A1C 7.200 % 06/24/2019 TSH 5.100 08/31/2019  Hemoglobin 13.600 g/d 08/26/2019 Platelets 155.000 K/ 08/26/2019  Creatinine, Serum 1.340 mg/ 08/26/2019 Potassium 4.700 mm 08/26/2019 Magnesium 1.700 MG/ 04/24/2015 ALT (SGPT) 21.000 IU/ 06/24/2019  05/21/2018: HbA1c 6.7. Lipid Panel: Chol 146, Trig 321, HDL 17, LDL 65, VLDL 64. ALK Ph 32.   08/28/17: TSH Normal  Allergies   Allergies  Allergen Reactions   Hydrocodone Other (See Comments)    Lethargic    Invokana [Canagliflozin] Palpitations and Other (See Comments)    Made heart race   Oxycodone Other (See Comments)    Lethargic     Medications Prior to Visit:   Outpatient Medications Prior to Visit  Medication Sig Dispense Refill   amiodarone (PACERONE) 200 MG tablet Take 1/2 (one-half) tablet by mouth once daily (Patient taking differently: Take 100 mg by mouth daily.) 45 tablet 3   ELIQUIS 5 MG TABS tablet Take 1 tablet by mouth twice daily 180 tablet 0   EUTHYROX 88 MCG tablet Take 88 mcg by mouth daily.     fenofibrate 160 MG tablet Take 160 mg by mouth daily with supper.      insulin aspart protamine- aspart (NOVOLOG MIX 70/30) (70-30) 100 UNIT/ML injection Inject 35-45 Units into the skin See admin instructions. Inject 45 units into the skin with breakfast, 35 units with supper (may adjust based on blood sugar readings)     isosorbide mononitrate (IMDUR) 60 MG 24 hr tablet Take 1 tablet (60 mg total) by mouth daily. 90 tablet 3   lidocaine (XYLOCAINE) 5 % ointment Apply 1 application topically 3 (three) times daily as needed. 35.44 g 2   metoprolol tartrate (LOPRESSOR) 50 MG tablet Take 1 tablet (50 mg total) by mouth 2 (two) times daily. 180 tablet 3   nitroGLYCERIN (NITROSTAT) 0.4 MG SL tablet Place 1 tablet (0.4 mg total) under the tongue every 5 (five) minutes as needed for chest pain. 25 tablet 1   nystatin cream (MYCOSTATIN)  APPLY  CREAM TOPICALLY TWICE DAILY AS NEEDED FOR  IRRIATATION  OF  AFFECTED  AREA 30 g 0   olmesartan-hydrochlorothiazide (BENICAR HCT) 40-25 MG tablet Take 1 tablet by mouth daily. 90 tablet 3   spironolactone (ALDACTONE) 25 MG tablet Take 1 tablet by mouth once daily (Patient taking differently: Take 25 mg by mouth daily.) 90 tablet 0   SURE COMFORT INSULIN SYRINGE 31G X 5/16" 0.5 ML MISC      torsemide (DEMADEX) 10 MG tablet Take 1 tablet (10 mg total) by mouth 2 (two) times daily. 30 tablet 3   traMADol (ULTRAM) 50 MG tablet Take 1 tablet (50 mg total) by mouth every 6 (six) hours as needed for severe pain. Do not take and drive 30 tablet 0   triamcinolone ointment (KENALOG) 0.5 % Apply topically 2 (two) times daily. (Patient taking differently: Apply 1 application topically 2 (two) times daily.) 30 g 3   Vibegron (GEMTESA) 75 MG TABS Take 75 mg by mouth  daily.     empagliflozin (JARDIANCE) 25 MG TABS tablet Take 1 tablet (25 mg total) by mouth daily before breakfast. (Patient not taking: Reported on 09/11/2020) 30 tablet 6   No facility-administered medications prior to visit.   Final Medications at End of Visit    Current Meds  Medication Sig   amiodarone (PACERONE) 200 MG tablet Take 1/2 (one-half) tablet by mouth once daily (Patient taking differently: Take 100 mg by mouth daily.)   ELIQUIS 5 MG TABS tablet Take 1 tablet by mouth twice daily   EUTHYROX 88 MCG tablet Take 88 mcg by mouth daily.   fenofibrate 160 MG tablet Take 160 mg by mouth daily with supper.    insulin aspart protamine- aspart (NOVOLOG MIX 70/30) (70-30) 100 UNIT/ML injection Inject 35-45 Units into the skin See admin instructions. Inject 45 units into the skin with breakfast, 35 units with supper (may adjust based on blood sugar readings)   isosorbide mononitrate (IMDUR) 60 MG 24 hr tablet Take 1 tablet (60 mg total) by mouth daily.   lidocaine (XYLOCAINE) 5 % ointment Apply 1 application topically 3 (three) times  daily as needed.   metoprolol tartrate (LOPRESSOR) 50 MG tablet Take 1 tablet (50 mg total) by mouth 2 (two) times daily.   nitroGLYCERIN (NITROSTAT) 0.4 MG SL tablet Place 1 tablet (0.4 mg total) under the tongue every 5 (five) minutes as needed for chest pain.   nystatin cream (MYCOSTATIN) APPLY  CREAM TOPICALLY TWICE DAILY AS NEEDED FOR  IRRIATATION  OF  AFFECTED  AREA   olmesartan-hydrochlorothiazide (BENICAR HCT) 40-25 MG tablet Take 1 tablet by mouth daily.   spironolactone (ALDACTONE) 25 MG tablet Take 1 tablet by mouth once daily (Patient taking differently: Take 25 mg by mouth daily.)   SURE COMFORT INSULIN SYRINGE 31G X 5/16" 0.5 ML MISC    torsemide (DEMADEX) 10 MG tablet Take 1 tablet (10 mg total) by mouth 2 (two) times daily.   traMADol (ULTRAM) 50 MG tablet Take 1 tablet (50 mg total) by mouth every 6 (six) hours as needed for severe pain. Do not take and drive   triamcinolone ointment (KENALOG) 0.5 % Apply topically 2 (two) times daily. (Patient taking differently: Apply 1 application topically 2 (two) times daily.)   Vibegron (GEMTESA) 75 MG TABS Take 75 mg by mouth daily.   Radiology:   No results found.  Cardiac Studies:    Lexiscan myoview stress test 06/15/2017: 1. Lexiscan stress test was performed. Exercise capacity was not assessed. Stress symptoms included dizziness. Peak blood pressure 158/66 mmHg. Stress EKG is non diagnostic for ischemia as it is a pharmacologic stress. In addition, it demonstrated atrial pacing and incomplete LBBB. 2. The overall quality of the study is excellent. There is no evidence of abnormal lung activity. Stress and rest SPECT images demonstrate homogeneous tracer distribution throughout the myocardium. Gated SPECT imaging reveals normal myocardial thickening and wall motion. The left ventricular ejection fraction was normal (71%). 3. Low risk study.   Sleep Study 08/11/2017: Positive for Complex Sleep Apnea; On CPAP    R&LHC  08/31/2017: LCx: Nondominant. AV grove LCx 50-60% diffuse disease. RCA: Large dominant. Ostial PDA 50% stenosis, FFR 0.96. No change from 09/30/2011. RA: 5 mmHg RV: 45/4 mmHg. PA: 40/12 mmHg. Mean PA 29 mmHg PCWP: 16 mmHg LV: 130/3 mmHg, LVEDP 13 mmHg CO: 3.6 L/min. CI 1.9 L/min/m2  Direct current cardioversion 09/07/2018: Indication symptomatic A. Flutter. Procedure: Using 50 mg of IV Propofol and 20 IV Lidocaine (  for reducing venous pain) for achieving deep sedation, synchronized direct current cardioversion performed. Patient was delivered with 120 Joules of electricity X 1 with success to A-Paced Rhythm. Patient tolerated the procedure well. No immediate complication noted.   External echocardiogram 08/23/2020:   1. The left ventricle is normal in size with mildly increased wall thickness.    2. The left ventricular systolic function is normal, LVEF is visually estimated at > 55%. There is grade III diastolic dysfunction (severely elevated filling pressure).    3. Mitral annular calcification is present (mild).    4. The mitral valve leaflets are mildly thickened with normal leaflet mobility.    5. There is mild mitral valve regurgitation.    6. The aortic valve is trileaflet with mildly thickened leaflets with normal excursion.    7. The left atrium is mildly dilated in size.    8. The right ventricle is upper normal in size, with normal systolic function.   Carotid artery duplex 09/06/2020:  Duplex suggests stenosis in the right internal carotid artery (16-49%).  Duplex suggests stenosis in the right external carotid artery (<50%).  Duplex suggests stenosis in the left internal carotid artery (16-49%).  Antegrade right vertebral artery flow. Antegrade left vertebral artery flow.  Compared to 02/09/2014, mild progression of disease in bilateral carotid arteries. Follow up in one year is appropriate if clinically indicated.  Pacemaker:  Remote dual-chamber pacemaker transmission  06/20/2020: Longevity 9 years. Lead impedance and thresholds within normal limits. There was no mode switch, no ventricular high rate episodes. AP 99 %, VP 6%. Normal pacemaker function.  Scheduled In office pacemaker check 08/30/2018:  There were 6 AHR Episodes Since 08/08/2018, EGM recordings reveal maximum a rate between 180-201 bpm,, The longest His persistent since 08/30/2018,Atrial rate 180, ventricular rate 1 28 bpm, EGM's suggest atrial tachycardia/AFL. There was 0.4% truly due to atrial arrhythmia burden, pacemaker thresholds and impedance stable. Longevity 8.5, 12 years  EKG:   05/18/2020: Atrial paced rhythm with first-degree AV block at a rate of 60 bpm.  Left axis, left anterior fascicular block.  Poor R wave progression, cannot exclude anteroseptal infarct old.  IVCD.  Compared to EKG 11/10/2019, no significant change.  11/10/2019: Atrially paced rhythm with first-degree AV block, ventricularly sensed.  Ventricular rate 60 bpm.  Left axis deviation, left intrafascicular block.  Anteroseptal infarct old.  IVCD, borderline criteria for LVH.  Nonspecific T abnormality.  No significant change from 01/11/2019.  08/30/2018: Atypical atrial flutter with 3:1 conduction, V rate 110 bpm, left axis deviation. LBBB.   Assessment     ICD-10-CM   1. Chronic diastolic heart failure (HCC)  I50.32     2. Essential hypertension  I10     3. Left carotid bruit  R09.89     4. Bilateral carotid artery stenosis  I65.23 PCV CAROTID DUPLEX (BILATERAL)      No orders of the defined types were placed in this encounter.    There are no discontinued medications.   This patients CHA2DS2-VASc Score 7 (CHF, HTN, DM, Vasc, Age, F) and yearly risk of stroke >9.8%.   Recommendations:   [name]  is a 75 y.o. female  with hypertension, type 2 diabetes mellitus with stage 3 CKD, chronic diastolic heart failure, mild hyperlipidemia on fenobibrate, nonobstructive CAD (Cath 2019), sick sinus syndrome  status post dual-chamber pacemaker 2017, OSA on BiPAP, paroxysmal Afib and atrial flutter/atrial tachycardia.  Her last cardioversion was 09/07/2018 for atrial flutter.  Patient was diagnosed with  squamous cell carcinoma of the vulva and underwent co[facility]22/[facility].  Postoperatively patient experienced complex complications including hypotension, hypoxic respiratory failure/pulmonary edema, surgical site infection, acute on chronic CKD.  Patient was discharged home with new supplemental oxygen requirement of 3 L.  During postoperative period patient experienced hypotension requiring pressor support.  She subsequently developed acute on chronic diastolic heart failure requiring diuresis.    Patient presents for 1 week follow-up of acute on chronic diastolic heart failure.  She was seen in our office 09/03/2020 with acute heart failure.  Patient was started on torsemide 10 mg p.o. twice daily and switch from carvedilol to metoprolol given elevated blood pressure.  Also obtained carotid artery duplex given left carotid bruit.  Reviewed and discussed with patient results of carotid artery duplex, details above.  Carotid disease has progressed mildly, will repeat carotid artery surveillance in 1 year.  Patient is not on aspirin as she is presently on Eliquis.  She is tolerating anticoagulation without bleeding diathesis.  Suspect patient's fatigue is multifactorial, including deconditioning following hospital stay as well as poor sleeping.  Counseled patient regarding sleep hygiene as well as conservative measures regarding dizziness.  Patient is not orthostatic in the office today.  Will not changes to medications at this time.  Instead first recommend patient make adjustments to sleep hygiene and we will continue to monitor closely.  Pressure is well controlled.  Follow-up in 4 weeks, sooner if needed, for fatigue, chronic diastolic heart failure.   Alethia Berthold, PA-C 09/11/2020, 2:47  PM Office: [phone]

## 2020-09-11 NOTE — Progress Notes (Signed)
HPI:  Ms. Janney was previously seen in the [place] clinic due to a personal and family history of cancer and concerns regarding a hereditary predisposition to cancer. Please refer to our prior cancer genetics clinic note for more information regarding our discussion, assessment and recommendations, at the time. Ms. Antwi recent genetic test results were disclosed to her, as were recommendations warranted by these results. These results and recommendations are discussed in more detail below.  CANCER HISTORY:  Oncology History  Vulvar cancer ([place])  07/20/2020 Initial Diagnosis   Vulvar cancer ([place])   07/26/2020 Surgery   EUA, vulvar biopsies  Findings: Pictures taken in media.  On exam, there is about a 4 x 3-1/2 cm area concerning for involvement by carcinoma that spans between the clitoris (replacing the bottom aspect of the clitoris) to just superior to the urethra.  This is a butterfly lesion that extends along the inner labia bilaterally.  Previous biopsy site is healing well with suture still intact.  There is a firmer and raised area that is approximately 1 cm around the area of the biopsy.  Biopsies taken circumferentially around the lesion noted as well as from the central aspect to help delineate what is cancer and what may be chronic dermatoses.  Given appearance today with patient more comfortable, I am suspicious that the entire lesion is carcinoma.   07/26/2020 Pathology Results   A. VULVA, 6 OCLOCK, INFERIOR ASPECT, BIOPSY:  - Superficial fragments of squamous cell carcinoma.   B. VULVA, 11 OCLOCK, BIOPSY:  - Invasive squamous cell carcinoma.  - No lymphovascular invasion.   C. VULVA, 2 OCLOCK, BIOPSY:  - Invasive squamous cell carcinoma.  - No lymphovascular invasion.   D. VULVA, CENTRAL, BIOPSY:  - Superficial fragment of squamous cell carcinoma.    08/06/2020 Imaging   PET: no evidence of metastatic disease   08/07/2020 Clinical Stage   Stage IB    09/06/2020 Genetic Testing   Negative genetic testing:  No pathogenic variants detected on the [facility] Multi-Cancer + RNA panel. A variant of uncertain significance (VUS) was detected in the POLD1 gene called c.427G>A (p.Gly143Ser). The report date is 09/06/2020.  The Multi-Cancer + RNA Panel offered by [facility] includes sequencing and/or deletion/duplication analysis of the following 84 genes:  AIP*, ALK, APC*, ATM*, AXIN2*, BAP1*, BARD1*, BLM*, BMPR1A*, BRCA1*, BRCA2*, BRIP1*, CASR, CDC73*, CDH1*, CDK4, CDKN1B*, CDKN1C*, CDKN2A, CEBPA, CHEK2*, CTNNA1*, DICER1*, DIS3L2*, EGFR, EPCAM, FH*, FLCN*, GATA2*, GPC3, GREM1, HOXB13, HRAS, KIT, MAX*, MEN1*, MET, MITF, MLH1*, MSH2*, MSH3*, MSH6*, MUTYH*, NBN*, NF1*, NF2*, NTHL1*, PALB2*, PDGFRA, PHOX2B, PMS2*, POLD1*, POLE*, POT1*, PRKAR1A*, PTCH1*, PTEN*, RAD50*, RAD51C*, RAD51D*, RB1*, RECQL4, RET, RUNX1*, SDHA*, SDHAF2*, SDHB*, SDHC*, SDHD*, SMAD4*, SMARCA4*, SMARCB1*, SMARCE1*, STK11*, SUFU*, TERC, TERT, TMEM127*, Tp53*, TSC1*, TSC2*, VHL*, WRN*, and WT1.  RNA analysis is performed for * genes.      FAMILY HISTORY:  We obtained a detailed, 4-generation family history.  Significant diagnoses are listed below: Family History  Problem Relation Age of Onset   Arthritis Mother    Diabetes Mother    Heart disease Mother    Hyperlipidemia Mother    Hypertension Mother    Arthritis Father    Asthma Father    Heart attack Father    Hyperlipidemia Father    Hypertension Father    Stroke Father    Arthritis Sister    Diabetes Sister    Hypertension Sister    Arthritis Sister    Diabetes Sister    Hyperlipidemia Sister  Hypertension Sister    Stroke Sister    Ovarian cancer Sister 70   Pancreatic cancer Sister 97   Uterine cancer Sister 39   Hyperlipidemia Sister    Hypertension Sister    Arthritis Brother    Heart attack Brother    Heart disease Brother    Hyperlipidemia Brother    Hypertension Brother    Alcohol abuse Brother    Arthritis  Brother    Diabetes Brother    Early death Brother    Heart disease Brother    Hyperlipidemia Brother    Hypertension Brother    Cancer Maternal Aunt 70       unknown type   Throat cancer Maternal Grandfather        hx smoking/drinking   Hypertension Daughter    Cancer Daughter        "female cancer cells"   Kidney cancer Nephew        dx 14s   Colon cancer Neg Hx    Breast cancer Neg Hx    Prostate cancer Neg Hx     Ms. Sherrard has two daughters (ages 37 and 61) and one son (age 47). Her younger daughter has had been treated for "female" cancer cells. She had three brothers and three sisters. One sister had uterine cancer at age 78 and then pancreatic cancer at age 68. Another sister had ovarian cancer at age 74, although she died from an aneurysm at age 107. One nephew died from kidney cancer in his 26s.    Ms. Bena mother died at age 57 without cancer. There were three maternal aunts and one maternal uncle. One aunt died from an unknown cancer around age 59. There is no known cancer among maternal maternal cousins. Ms. Chenard maternal grandmother died older than 57 without cancer. Her maternal grandfather died older than 83 with throat cancer, and had a history of smoking and alcohol use (he made liquor).   Ms. Stroope father died at age 57 without cancer. There were two paternal aunts and three paternal uncles. There is no known cancer among paternal aunts/uncles or paternal cousins. Ms. Blackston paternal grandmother died younger than 76 without cancer. Her paternal grandfather died older than 65 without cancer.   Ms. Elsbury is unaware of previous family history of genetic testing for hereditary cancer risks. Patient's ancestors are of unknown descent. There is no reported Ashkenazi Jewish ancestry. There is no known consanguinity.  GENETIC TEST RESULTS: Genetic testing reported out on 09/06/2020 through the [facility] Multi-Cancer + RNA panel. No pathogenic variants were detected.    The Multi-Cancer + RNA Panel offered by [facility] includes sequencing and/or deletion/duplication analysis of the following 84 genes:  AIP*, ALK, APC*, ATM*, AXIN2*, BAP1*, BARD1*, BLM*, BMPR1A*, BRCA1*, BRCA2*, BRIP1*, CASR, CDC73*, CDH1*, CDK4, CDKN1B*, CDKN1C*, CDKN2A, CEBPA, CHEK2*, CTNNA1*, DICER1*, DIS3L2*, EGFR, EPCAM, FH*, FLCN*, GATA2*, GPC3, GREM1, HOXB13, HRAS, KIT, MAX*, MEN1*, MET, MITF, MLH1*, MSH2*, MSH3*, MSH6*, MUTYH*, NBN*, NF1*, NF2*, NTHL1*, PALB2*, PDGFRA, PHOX2B, PMS2*, POLD1*, POLE*, POT1*, PRKAR1A*, PTCH1*, PTEN*, RAD50*, RAD51C*, RAD51D*, RB1*, RECQL4, RET, RUNX1*, SDHA*, SDHAF2*, SDHB*, SDHC*, SDHD*, SMAD4*, SMARCA4*, SMARCB1*, SMARCE1*, STK11*, SUFU*, TERC, TERT, TMEM127*, Tp53*, TSC1*, TSC2*, VHL*, WRN*, and WT1.  RNA analysis is performed for * genes. The test report will be scanned into EPIC and located under the Molecular Pathology section of the Results Review tab.  A portion of the result report is included below for reference.     We discussed with Ms. Mitten that because current genetic  testing is not perfect, it is possible there may be a gene mutation in one of these genes that current testing cannot detect, but that chance is small.  We also discussed that there could be another gene that has not yet been discovered, or that we have not yet tested, that is responsible for the cancer diagnoses in the family. It is also possible there is a hereditary cause for the cancer in the family that Ms. Kochanowski did not inherit and therefore was not identified in her testing.  Therefore, it is important to remain in touch with cancer genetics in the future so that we can continue to offer Ms. Bari the most up to date genetic testing.   Genetic testing did identify a variant of uncertain significance (VUS) in the POLD1 gene called c.427G>A (p.Gly143Ser). At this time, it is unknown if this variant is associated with increased cancer risk or if this is a normal finding, but most  variants such as this get reclassified to being inconsequential. It should not be used to make medical management decisions. With time, we suspect the lab will determine the significance of this variant, if any. If we do learn more about it, we will try to contact Ms. Barkett to discuss it further. However, it is important to stay in touch with [place] periodically and keep the address and phone number up to date.  ADDITIONAL GENETIC TESTING: We discussed with Ms. Sprunger that her genetic testing was fairly extensive.  If there are genes identified to increase cancer risk that can be analyzed in the future, we would be happy to discuss and coordinate this testing at that time.    CANCER SCREENING RECOMMENDATIONS: Ms. Jenison test result is considered negative (normal).  This means that we have not identified a hereditary cause for her personal and family history of cancer at this time. While reassuring, this does not definitively rule out a hereditary predisposition to cancer. It is still possible that there could be genetic mutations that are undetectable by current technology. There could be genetic mutations in genes that have not been tested or identified to increase cancer risk.  Therefore, it is recommended she continue to follow the cancer management and screening guidelines provided by her oncology and primary healthcare provider.   An individual's cancer risk and medical management are not determined by genetic test results alone. Overall cancer risk assessment incorporates additional factors, including personal medical history, family history, and any available genetic information that may result in a personalized plan for cancer prevention and surveillance.  Breast Cancer Risk: Based on Ms. Hirano's personal and family history, as well as her genetic test results, the Tyrer-Cuzick breast cancer risk model was used to estimate her risk of developing breast cancer. Tyrer-Cuzick estimates her lifetime risk  of developing breast cancer to be approximately 1.2%. This lifetime breast cancer risk is a preliminary estimate based on available information using one of several models endorsed by the [place] (ACS). The ACS recommends consideration of breast MRI screening as an adjunct to mammography for patients at high risk (defined as 20% or greater lifetime risk). A more detailed breast cancer risk assessment can be considered, if clinically indicated. This risk estimate can change over time and may be repeated to reflect new information in her personal or family history in the future.   RECOMMENDATIONS FOR FAMILY MEMBERS:  Individuals in this family might be at some increased risk of developing cancer, over the general population risk, simply due  to the family history of cancer.  We recommended women in this family have a yearly mammogram beginning at age 94, or 41 years younger than the earliest onset of cancer, an annual clinical breast exam, and perform monthly breast self-exams. Women in this family should also have a gynecological exam as recommended by their primary provider. All family members should be referred for colonoscopy starting at age 61.  It is also possible there is a hereditary cause for the cancer in Ms. Blaker's family that she did not inherit and therefore was not identified in her.  Based on Ms. Tubbs's family history, we recommended the first degree relatives of her sisters and nephew who had cancer have genetic counseling and testing. Ms. Cara will let us know if we can be of any assistance in coordinating genetic counseling and/or testing for these family members.   FOLLOW-UP: Lastly, we discussed with Ms. Mansouri that cancer genetics is a rapidly advancing field and it is possible that new genetic tests will be appropriate for her and/or her family members in the future. We encouraged her to remain in contact with cancer genetics on an annual basis so we can update her  personal and family histories and let her know of advances in cancer genetics that may benefit this family.   Our contact number was provided. Ms. Sprenkle questions were answered to her satisfaction, and she knows she is welcome to call us at anytime with additional questions or concerns.   Clint Guy, MS, [facility] Genetic Counselor [email or web address] Phone: [phone]

## 2020-09-11 NOTE — Telephone Encounter (Signed)
Revealed negative genetic testing. Discussed that we do not know why there is cancer in the family. There could be a genetic mutation in the family that [name] did not inherit. There could also be a mutation in a different gene that we are not testing, or our current technology may not be able to detect certain mutations. It will therefore be important for her to stay in contact with genetics to keep up with whether additional testing may be appropriate in the future.   A variant of uncertain significance was detected in the POLD1 gene called c.427G>A (p.Gly143Ser). Her result is still considered normal at this time and should not impact her medical management.

## 2020-09-12 DIAGNOSIS — N189 Chronic kidney disease, unspecified: Secondary | ICD-10-CM | POA: Diagnosis not present

## 2020-09-12 DIAGNOSIS — I130 Hypertensive heart and chronic kidney disease with heart failure and stage 1 through stage 4 chronic kidney disease, or unspecified chronic kidney disease: Secondary | ICD-10-CM | POA: Diagnosis not present

## 2020-09-12 DIAGNOSIS — G4733 Obstructive sleep apnea (adult) (pediatric): Secondary | ICD-10-CM | POA: Diagnosis not present

## 2020-09-12 DIAGNOSIS — I5032 Chronic diastolic (congestive) heart failure: Secondary | ICD-10-CM | POA: Diagnosis not present

## 2020-09-12 DIAGNOSIS — Z483 Aftercare following surgery for neoplasm: Secondary | ICD-10-CM | POA: Diagnosis not present

## 2020-09-12 DIAGNOSIS — I2510 Atherosclerotic heart disease of native coronary artery without angina pectoris: Secondary | ICD-10-CM | POA: Diagnosis not present

## 2020-09-12 DIAGNOSIS — E1142 Type 2 diabetes mellitus with diabetic polyneuropathy: Secondary | ICD-10-CM | POA: Diagnosis not present

## 2020-09-12 DIAGNOSIS — C519 Malignant neoplasm of vulva, unspecified: Secondary | ICD-10-CM | POA: Diagnosis not present

## 2020-09-12 DIAGNOSIS — I480 Paroxysmal atrial fibrillation: Secondary | ICD-10-CM | POA: Diagnosis not present

## 2020-09-12 DIAGNOSIS — E039 Hypothyroidism, unspecified: Secondary | ICD-10-CM | POA: Diagnosis not present

## 2020-09-14 ENCOUNTER — Telehealth: Payer: Self-pay | Admitting: *Deleted

## 2020-09-14 NOTE — Telephone Encounter (Signed)
Spoke with the patient's son and rescheduled her appt from 8/29 to 9/7

## 2020-09-16 ENCOUNTER — Other Ambulatory Visit: Payer: Self-pay | Admitting: Cardiology

## 2020-09-19 ENCOUNTER — Other Ambulatory Visit: Payer: Self-pay

## 2020-09-19 ENCOUNTER — Encounter: Payer: Self-pay | Admitting: Oncology

## 2020-09-19 ENCOUNTER — Telehealth: Payer: Self-pay | Admitting: Oncology

## 2020-09-19 ENCOUNTER — Encounter: Payer: Self-pay | Admitting: Gynecologic Oncology

## 2020-09-19 ENCOUNTER — Inpatient Hospital Stay: Payer: Medicare HMO | Attending: Gynecologic Oncology | Admitting: Gynecologic Oncology

## 2020-09-19 ENCOUNTER — Other Ambulatory Visit: Payer: Self-pay | Admitting: Cardiology

## 2020-09-19 VITALS — BP 158/44 | HR 59 | Temp 97.6°F | Resp 18 | Ht 62.0 in | Wt 175.2 lb

## 2020-09-19 DIAGNOSIS — T8131XA Disruption of external operation (surgical) wound, not elsewhere classified, initial encounter: Secondary | ICD-10-CM

## 2020-09-19 DIAGNOSIS — G4733 Obstructive sleep apnea (adult) (pediatric): Secondary | ICD-10-CM | POA: Diagnosis not present

## 2020-09-19 DIAGNOSIS — Z7189 Other specified counseling: Secondary | ICD-10-CM

## 2020-09-19 DIAGNOSIS — Z9079 Acquired absence of other genital organ(s): Secondary | ICD-10-CM

## 2020-09-19 DIAGNOSIS — C519 Malignant neoplasm of vulva, unspecified: Secondary | ICD-10-CM

## 2020-09-19 NOTE — Progress Notes (Signed)
Gynecologic Oncology Return Clinic Visit  09/19/20  Reason for Visit: follow-up after recent surgery, treatment discussion and planning  Treatment History: Oncology History  Vulvar cancer ([name])  07/20/2020 Initial Diagnosis   Vulvar cancer ([name])   07/26/2020 Surgery   EUA, vulvar biopsies  Findings: Pictures taken in media.  On exam, there is about a 4 x 3-1/2 cm area concerning for involvement by carcinoma that spans between the clitoris (replacing the bottom aspect of the clitoris) to just superior to the urethra.  This is a butterfly lesion that extends along the inner labia bilaterally.  Previous biopsy site is healing well with suture still intact.  There is a firmer and raised area that is approximately 1 cm around the area of the biopsy.  Biopsies taken circumferentially around the lesion noted as well as from the central aspect to help delineate what is cancer and what may be chronic dermatoses.  Given appearance today with patient more comfortable, I am suspicious that the entire lesion is carcinoma.   07/26/2020 Pathology Results   A. VULVA, 6 OCLOCK, INFERIOR ASPECT, BIOPSY:  - Superficial fragments of squamous cell carcinoma.   B. VULVA, 11 OCLOCK, BIOPSY:  - Invasive squamous cell carcinoma.  - No lymphovascular invasion.   C. VULVA, 2 OCLOCK, BIOPSY:  - Invasive squamous cell carcinoma.  - No lymphovascular invasion.   D. VULVA, CENTRAL, BIOPSY:  - Superficial fragment of squamous cell carcinoma.    08/06/2020 Imaging   PET: no evidence of metastatic disease   08/07/2020 Clinical Stage   Stage IB   08/22/2020 Surgery   Partial radical anterior vulvectomy with right inguinal SLN biopsy.  Findings: 4x4cm butterfly lesion on the anterior vulva, spanning from just superior to the clitoris to just superior to the urethral meatus. Lesion crossed the midline, more significant lesion on the right. LSG prior to surgery showed bilateral mapping. No hot or blue SLN identified on  the left, mildly blue and hot node identified as the SLN on the right. Decision made to abort left-sided LND given hypertension and difficulty ventilating. Additionally, given intra-op cardiac and respiratory status, decision made to proceed with a less radical resection (very close if not positive peri-urethral margin visibly.   08/22/2020 Pathology Results   Stage IB, lymph node assessment only on the right given comorbidities and cardiac issues during surgery  Tumor Focality:    Unifocal     Tumor Site:    Right vulva     Tumor Site:    Left vulva     Tumor Size:    Greatest Dimension (Centimeters): 4 cm     Histologic Type:    Squamous cell carcinoma, HPV-independent     Histologic Grade:    G3, poorly differentiated     Depth of Tumor Invasion:    6 mm     Tumor Border:    Infiltrating     Other Tissue / Organ Involvement:    Not applicable     Lymphovascular Invasion:    Not identified   MARGINS     Margin Status for Invasive Carcinoma:    All margins negative for invasive carcinoma       Closest Margin(s) to Invasive Carcinoma:    Peripheral: 12:00       Distance from Invasive Carcinoma to Closest Margin:    1 mm     Margin Status for HSIL (VIN2-3) or dVIN:    All margins negative for high-grade squamous intraepithelial lesion (HSIL) and / or  differentiated vulvar intraepithelial neoplasia (dVIN)   REGIONAL LYMPH NODES     Regional Lymph Node Status:           :    All regional lymph nodes negative for tumor cells       Total Number of Lymph Nodes Examined:    1         Nodal Site(s) Examined:    Right inguinal       Number of Sentinel Nodes Examined:    1    09/06/2020 Genetic Testing   Negative genetic testing:  No pathogenic variants detected on the [facility] Multi-Cancer + RNA panel. A variant of uncertain significance (VUS) was detected in the POLD1 gene called c.427G>A (p.Gly143Ser). The report date is 09/06/2020.  The Multi-Cancer + RNA Panel offered by [facility] includes  sequencing and/or deletion/duplication analysis of the following 84 genes:  AIP*, ALK, APC*, ATM*, AXIN2*, BAP1*, BARD1*, BLM*, BMPR1A*, BRCA1*, BRCA2*, BRIP1*, CASR, CDC73*, CDH1*, CDK4, CDKN1B*, CDKN1C*, CDKN2A, CEBPA, CHEK2*, CTNNA1*, DICER1*, DIS3L2*, EGFR, EPCAM, FH*, FLCN*, GATA2*, GPC3, GREM1, HOXB13, HRAS, KIT, MAX*, MEN1*, MET, MITF, MLH1*, MSH2*, MSH3*, MSH6*, MUTYH*, NBN*, NF1*, NF2*, NTHL1*, PALB2*, PDGFRA, PHOX2B, PMS2*, POLD1*, POLE*, POT1*, PRKAR1A*, PTCH1*, PTEN*, RAD50*, RAD51C*, RAD51D*, RB1*, RECQL4, RET, RUNX1*, SDHA*, SDHAF2*, SDHB*, SDHC*, SDHD*, SMAD4*, SMARCA4*, SMARCB1*, SMARCE1*, STK11*, SUFU*, TERC, TERT, TMEM127*, Tp53*, TSC1*, TSC2*, VHL*, WRN*, and WT1.  RNA analysis is performed for * genes.      Interval History: She was ultimately discharged on 8/17 on 3L of oxygen with close cardiac follow-up and on antibiotics in the setting of concern for superficial wound infection.   Reports continuing to recover well since. Has some continued soreness of her bottom, which is improving with time. Numbness is also improving. She denies any vaginal bleeding, has intermittnet mild vaginal discharge. She had some diarrhea during treatment, this has mostly resolved but she has occasional episodes like diarrhea (often incontinence) with passage of clear mucous or liquid - feels certain this is from her rectum.   Denies any urinary symptoms, feels that her stream is normal. Her breathing has improved significantly. She is off oxygen during the day, uses it still some at night.   Reports some nausea including when she thinks about food. Denies emesis.  Past Medical/Surgical History: Past Medical History:  Diagnosis Date   Arthritis    Diabetes mellitus without complication ([name])    Dyspnea    Encounter for care of pacemaker 09/03/2018   Family history of kidney cancer    Family history of throat cancer    Family history of uterine cancer    History of chickenpox    Hx of  psoriatic arthritis    Hyperlipemia    Hypertension    Hypertension 05/03/2018   Hypothyroidism    Mitral valve disorders(424.0)    Obstructive sleep apnea    Pacemaker    Paroxysmal atrial fibrillation ([place]) 10/11/2016   Peripheral neuropathy    Renal disorder Kidney disease stage2   Thyroid disease hypothyroid   Urine incontinence    Vitamin D deficiency     Past Surgical History:  Procedure Laterality Date   BACK SURGERY     BACK SURGERY     CARDIAC CATHETERIZATION N/A 04/29/2015   Procedure: Temporary Pacemaker;  Surgeon: Adrian Prows, MD;  Location: [facility];  Service: Cardiovascular;  Laterality: N/A;   CARDIOVERSION N/A 09/07/2018   Procedure: CARDIOVERSION;  Surgeon: Adrian Prows, MD;  Location: [facility];  Service: Cardiovascular;  Laterality:  N/A;   CARDIOVERSION     COLONOSCOPY     EP IMPLANTABLE DEVICE N/A 04/30/2015   Procedure: Pacemaker Implant;  Surgeon: Evans Lance, MD;  Location: [facility];  Service: Cardiovascular;  Laterality: N/A;   INTRAVASCULAR PRESSURE WIRE/FFR STUDY N/A 08/31/2017   Procedure: INTRAVASCULAR PRESSURE WIRE/FFR STUDY;  Surgeon: Nigel Mormon, MD;  Location: [facility];  Service: Cardiovascular;  Laterality: N/A;   LEFT AND RIGHT HEART CATHETERIZATION WITH CORONARY ANGIOGRAM N/A 09/30/2011   Procedure: LEFT AND RIGHT HEART CATHETERIZATION WITH CORONARY ANGIOGRAM;  Surgeon: Laverda Page, MD;  Location: [facility];  Service: Cardiovascular;  Laterality: N/A;   RIGHT/LEFT HEART CATH AND CORONARY ANGIOGRAPHY N/A 08/31/2017   Procedure: RIGHT/LEFT HEART CATH AND CORONARY ANGIOGRAPHY;  Surgeon: Nigel Mormon, MD;  Location: [facility];  Service: Cardiovascular;  Laterality: N/A;   TOOTH EXTRACTION  10/07/2018   TUBAL LIGATION     VULVA [name] BIOPSY N/A 07/26/2020   Procedure: VULVAR BIOPSY;  Surgeon: Lafonda Mosses, MD;  Location: [facility];  Service: Gynecology;  Laterality: N/A;   YAG  LASER APPLICATION Right 44/03/4740   Procedure: YAG LASER APPLICATION;  Surgeon: Elta Guadeloupe T. Gershon Crane, MD;  Location: [facility];  Service: Ophthalmology;  Laterality: Right;  pt knows to arrive at 59:56   YAG LASER APPLICATION Left 38/75/6433   Procedure: YAG LASER APPLICATION;  Surgeon: Rutherford Guys, MD;  Location: [facility];  Service: Ophthalmology;  Laterality: Left;    Family History  Problem Relation Age of Onset   Arthritis Mother    Diabetes Mother    Heart disease Mother    Hyperlipidemia Mother    Hypertension Mother    Arthritis Father    Asthma Father    Heart attack Father    Hyperlipidemia Father    Hypertension Father    Stroke Father    Arthritis Sister    Diabetes Sister    Hypertension Sister    Arthritis Sister    Diabetes Sister    Hyperlipidemia Sister    Hypertension Sister    Stroke Sister    Ovarian cancer Sister 79   Pancreatic cancer Sister 55   Uterine cancer Sister 53   Hyperlipidemia Sister    Hypertension Sister    Arthritis Brother    Heart attack Brother    Heart disease Brother    Hyperlipidemia Brother    Hypertension Brother    Alcohol abuse Brother    Arthritis Brother    Diabetes Brother    Early death Brother    Heart disease Brother    Hyperlipidemia Brother    Hypertension Brother    Cancer Maternal Aunt 70       unknown type   Throat cancer Maternal Grandfather        hx smoking/drinking   Hypertension Daughter    Cancer Daughter        "female cancer cells"   Kidney cancer Nephew        dx 54s   Colon cancer Neg Hx    Breast cancer Neg Hx    Prostate cancer Neg Hx     Social History   Socioeconomic History   Marital status: Widowed    Spouse name: Not on file   Number of children: 3   Years of education: Not on file   Highest education level: Not on file  Occupational History   Not on file  Tobacco Use   Smoking status: Never  Smokeless tobacco: Never  Vaping Use   Vaping Use: Never used  Substance and Sexual  Activity   Alcohol use: No    Alcohol/week: 0.0 standard drinks   Drug use: No   Sexual activity: Not Currently    Birth control/protection: Post-menopausal  Other Topics Concern   Not on file  Social History Narrative   Husband recently passed away on 05/20/20.   Social Determinants of Health   Financial Resource Strain: Not on file  Food Insecurity: Not on file  Transportation Needs: Not on file  Physical Activity: Not on file  Stress: Not on file  Social Connections: Not on file    Current Medications:  Current Outpatient Medications:    amiodarone (PACERONE) 200 MG tablet, Take 1/2 (one-half) tablet by mouth once daily (Patient taking differently: Take 100 mg by mouth daily.), Disp: 45 tablet, Rfl: 3   ELIQUIS 5 MG TABS tablet, Take 1 tablet by mouth twice daily, Disp: 180 tablet, Rfl: 0   empagliflozin (JARDIANCE) 25 MG TABS tablet, Take 1 tablet (25 mg total) by mouth daily before breakfast., Disp: 30 tablet, Rfl: 6   EUTHYROX 88 MCG tablet, Take 88 mcg by mouth daily., Disp: , Rfl:    fenofibrate 160 MG tablet, Take 160 mg by mouth daily with supper. , Disp: , Rfl:    insulin aspart protamine- aspart (NOVOLOG MIX 70/30) (70-30) 100 UNIT/ML injection, Inject 35-45 Units into the skin See admin instructions. Inject 45 units into the skin with breakfast, 35 units with supper (may adjust based on blood sugar readings), Disp: , Rfl:    isosorbide mononitrate (IMDUR) 60 MG 24 hr tablet, Take 1 tablet (60 mg total) by mouth daily., Disp: 90 tablet, Rfl: 3   lidocaine (XYLOCAINE) 5 % ointment, Apply 1 application topically 3 (three) times daily as needed., Disp: 35.44 g, Rfl: 2   metoprolol tartrate (LOPRESSOR) 50 MG tablet, Take 1 tablet (50 mg total) by mouth 2 (two) times daily., Disp: 180 tablet, Rfl: 3   nitroGLYCERIN (NITROSTAT) 0.4 MG SL tablet, Place 1 tablet (0.4 mg total) under the tongue every 5 (five) minutes as needed for chest pain., Disp: 25 tablet, Rfl: 1    nystatin cream (MYCOSTATIN), APPLY  CREAM TOPICALLY TWICE DAILY AS NEEDED FOR  IRRIATATION  OF  AFFECTED  AREA, Disp: 30 g, Rfl: 0   olmesartan-hydrochlorothiazide (BENICAR HCT) 40-25 MG tablet, Take 1 tablet by mouth daily., Disp: 90 tablet, Rfl: 3   spironolactone (ALDACTONE) 25 MG tablet, Take 1 tablet by mouth once daily (Patient taking differently: Take 25 mg by mouth daily.), Disp: 90 tablet, Rfl: 0   SURE COMFORT INSULIN SYRINGE 31G X 5/16" 0.5 ML MISC, , Disp: , Rfl:    torsemide (DEMADEX) 10 MG tablet, Take 1 tablet (10 mg total) by mouth 2 (two) times daily., Disp: 30 tablet, Rfl: 3   traMADol (ULTRAM) 50 MG tablet, Take 1 tablet (50 mg total) by mouth every 6 (six) hours as needed for severe pain. Do not take and drive, Disp: 30 tablet, Rfl: 0   triamcinolone ointment (KENALOG) 0.5 %, Apply topically 2 (two) times daily. (Patient taking differently: Apply 1 application topically 2 (two) times daily.), Disp: 30 g, Rfl: 3   Vibegron (GEMTESA) 75 MG TABS, Take 75 mg by mouth daily., Disp: , Rfl:   Review of Systems: + fatigue, bowel issues, nausea.  Denies appetite changes, fevers, chills, unexplained weight changes. Denies hearing loss, neck lumps or masses, mouth sores,  ringing in ears or voice changes. Denies cough or wheezing.  Denies shortness of breath. Denies chest pain or palpitations. Denies leg swelling. Denies abdominal distention, pain, blood in stools, constipation, vomiting, or early satiety. Denies pain with intercourse, dysuria, frequency, hematuria or incontinence. Denies hot flashes, pelvic pain, vaginal bleeding or vaginal discharge.   Denies joint pain, back pain or muscle pain/cramps. Denies itching, rash, or wounds. Denies dizziness, headaches, numbness or seizures. Denies swollen lymph nodes or glands, denies easy bruising or bleeding. Denies anxiety, depression, confusion, or decreased concentration.  Physical Exam: BP (!) 158/44 (BP Location: Right Arm,  Patient Position: Sitting)   Pulse (!) 59   Temp 97.6 F (36.4 C) (Oral)   Resp 18   Ht 5' 2"  (1.575 m)   Wt 175 lb 3.2 oz (79.5 kg)   SpO2 99%   BMI 32.04 kg/m  General: Alert, oriented, no acute distress. HEENT: Normocephalic, atraumatic, sclera anicteric. Chest: Clear to auscultation bilaterally.  No wheezing or rhonchi. Abdomen: soft, nontender.  Normoactive bowel sounds.  No masses or hepatosplenomegaly appreciated.   Extremities: Grossly normal range of motion.  Warm, well perfused.  Trace edema bilaterally. Skin: No rashes or lesions noted. GU: Groin incisions are clean, dry and intact. No erythema or induration. Apical portion of the vulvar incision is intact, the majority has opened superficially(approximately 3x3cm). There is minimal exudate within the surgical bed, no erythema or induration. Urethra is normal in appearance. Rectum normal in appearance.   Laboratory & Radiologic Studies: None new  Assessment & Plan: [name] is a 75 y.o. woman with at least Stage IB grade 3 SCC of the vulva, HPV - independent, who presents for follow-up after surgery complicated by significant cardiac morbidity.  The patient is overall doing well postoperatively but has had superficial dehiscence of the majority of her incision. We discussed that there is no evidence of infection and that her incision will heal by secondary intent.   Given close margins, high-risk features of her tumor, I recommend adjuvant RT. This was also the consensus recommendation after her case was presented at [facility] tumor board. Given inability to surgically evaluate her left groin, I would favor covering this groin within the radiation field. In the setting of incision appearance today, I would recommend likely waiting until 8 weeks post-op to start treatment. I will re-evaluate her in 3-4 weeks.  Since the patient lives in [place], I spoke with her about option of RT here in [place] versus [facility].  She prefers to be treated here. We will make an appointment for consultation with Dr. Sondra Come.  In terms of her nausea, we discussed treatment options. I suggested she try some pre and/or probiotics. Since she was on antibiotics after surgery, she may have had some change in gut flora. We also discussed a possible trial of GERD OTC treatment. She will keep me posted on symptoms.   38 minutes of total time was spent for this patient encounter, including preparation, face-to-face counseling with the patient and coordination of care, and documentation of the encounter.  Jeral Pinch, MD  Division of Gynecologic Oncology  Department of Obstetrics and Gynecology  [facility]

## 2020-09-19 NOTE — Progress Notes (Signed)
Referral faxed to [facility] at [phone].

## 2020-09-19 NOTE — Patient Instructions (Signed)
Its good to see you. Part of your incision opened, but it doesn't look infected and is healing well. Keep doing the Sitz baths and using the squirt bottle after you pee.  We will get you set up to see Dr. Sondra Come, our radiation oncologist hear in several weeks to talk about radiation once you've healed more from surgery.

## 2020-09-19 NOTE — Telephone Encounter (Signed)
Left a message with [facility] to cancel referral.

## 2020-09-20 DIAGNOSIS — N189 Chronic kidney disease, unspecified: Secondary | ICD-10-CM | POA: Diagnosis not present

## 2020-09-20 DIAGNOSIS — G4733 Obstructive sleep apnea (adult) (pediatric): Secondary | ICD-10-CM | POA: Diagnosis not present

## 2020-09-20 DIAGNOSIS — C519 Malignant neoplasm of vulva, unspecified: Secondary | ICD-10-CM | POA: Diagnosis not present

## 2020-09-20 DIAGNOSIS — E039 Hypothyroidism, unspecified: Secondary | ICD-10-CM | POA: Diagnosis not present

## 2020-09-20 DIAGNOSIS — E1142 Type 2 diabetes mellitus with diabetic polyneuropathy: Secondary | ICD-10-CM | POA: Diagnosis not present

## 2020-09-20 DIAGNOSIS — I2510 Atherosclerotic heart disease of native coronary artery without angina pectoris: Secondary | ICD-10-CM | POA: Diagnosis not present

## 2020-09-20 DIAGNOSIS — I5032 Chronic diastolic (congestive) heart failure: Secondary | ICD-10-CM | POA: Diagnosis not present

## 2020-09-20 DIAGNOSIS — Z483 Aftercare following surgery for neoplasm: Secondary | ICD-10-CM | POA: Diagnosis not present

## 2020-09-20 DIAGNOSIS — I480 Paroxysmal atrial fibrillation: Secondary | ICD-10-CM | POA: Diagnosis not present

## 2020-09-20 DIAGNOSIS — I130 Hypertensive heart and chronic kidney disease with heart failure and stage 1 through stage 4 chronic kidney disease, or unspecified chronic kidney disease: Secondary | ICD-10-CM | POA: Diagnosis not present

## 2020-09-20 NOTE — Telephone Encounter (Signed)
Is this ok to fill? 

## 2020-09-21 DIAGNOSIS — E1142 Type 2 diabetes mellitus with diabetic polyneuropathy: Secondary | ICD-10-CM | POA: Diagnosis not present

## 2020-09-21 DIAGNOSIS — Z483 Aftercare following surgery for neoplasm: Secondary | ICD-10-CM | POA: Diagnosis not present

## 2020-09-21 DIAGNOSIS — E039 Hypothyroidism, unspecified: Secondary | ICD-10-CM | POA: Diagnosis not present

## 2020-09-21 DIAGNOSIS — I130 Hypertensive heart and chronic kidney disease with heart failure and stage 1 through stage 4 chronic kidney disease, or unspecified chronic kidney disease: Secondary | ICD-10-CM | POA: Diagnosis not present

## 2020-09-21 DIAGNOSIS — G4733 Obstructive sleep apnea (adult) (pediatric): Secondary | ICD-10-CM | POA: Diagnosis not present

## 2020-09-21 DIAGNOSIS — I480 Paroxysmal atrial fibrillation: Secondary | ICD-10-CM | POA: Diagnosis not present

## 2020-09-21 DIAGNOSIS — I2510 Atherosclerotic heart disease of native coronary artery without angina pectoris: Secondary | ICD-10-CM | POA: Diagnosis not present

## 2020-09-21 DIAGNOSIS — I5032 Chronic diastolic (congestive) heart failure: Secondary | ICD-10-CM | POA: Diagnosis not present

## 2020-09-21 DIAGNOSIS — N189 Chronic kidney disease, unspecified: Secondary | ICD-10-CM | POA: Diagnosis not present

## 2020-09-21 DIAGNOSIS — C519 Malignant neoplasm of vulva, unspecified: Secondary | ICD-10-CM | POA: Diagnosis not present

## 2020-09-26 DIAGNOSIS — N189 Chronic kidney disease, unspecified: Secondary | ICD-10-CM | POA: Diagnosis not present

## 2020-09-26 DIAGNOSIS — Z483 Aftercare following surgery for neoplasm: Secondary | ICD-10-CM | POA: Diagnosis not present

## 2020-09-26 DIAGNOSIS — E039 Hypothyroidism, unspecified: Secondary | ICD-10-CM | POA: Diagnosis not present

## 2020-09-26 DIAGNOSIS — I2510 Atherosclerotic heart disease of native coronary artery without angina pectoris: Secondary | ICD-10-CM | POA: Diagnosis not present

## 2020-09-26 DIAGNOSIS — I5032 Chronic diastolic (congestive) heart failure: Secondary | ICD-10-CM | POA: Diagnosis not present

## 2020-09-26 DIAGNOSIS — I480 Paroxysmal atrial fibrillation: Secondary | ICD-10-CM | POA: Diagnosis not present

## 2020-09-26 DIAGNOSIS — I130 Hypertensive heart and chronic kidney disease with heart failure and stage 1 through stage 4 chronic kidney disease, or unspecified chronic kidney disease: Secondary | ICD-10-CM | POA: Diagnosis not present

## 2020-09-26 DIAGNOSIS — G4733 Obstructive sleep apnea (adult) (pediatric): Secondary | ICD-10-CM | POA: Diagnosis not present

## 2020-09-26 DIAGNOSIS — E1142 Type 2 diabetes mellitus with diabetic polyneuropathy: Secondary | ICD-10-CM | POA: Diagnosis not present

## 2020-09-26 DIAGNOSIS — C519 Malignant neoplasm of vulva, unspecified: Secondary | ICD-10-CM | POA: Diagnosis not present

## 2020-09-27 DIAGNOSIS — G4733 Obstructive sleep apnea (adult) (pediatric): Secondary | ICD-10-CM | POA: Diagnosis not present

## 2020-09-27 DIAGNOSIS — I2510 Atherosclerotic heart disease of native coronary artery without angina pectoris: Secondary | ICD-10-CM | POA: Diagnosis not present

## 2020-09-27 DIAGNOSIS — C519 Malignant neoplasm of vulva, unspecified: Secondary | ICD-10-CM | POA: Diagnosis not present

## 2020-09-27 DIAGNOSIS — N189 Chronic kidney disease, unspecified: Secondary | ICD-10-CM | POA: Diagnosis not present

## 2020-09-27 DIAGNOSIS — I480 Paroxysmal atrial fibrillation: Secondary | ICD-10-CM | POA: Diagnosis not present

## 2020-09-27 DIAGNOSIS — E039 Hypothyroidism, unspecified: Secondary | ICD-10-CM | POA: Diagnosis not present

## 2020-09-27 DIAGNOSIS — I5032 Chronic diastolic (congestive) heart failure: Secondary | ICD-10-CM | POA: Diagnosis not present

## 2020-09-27 DIAGNOSIS — Z483 Aftercare following surgery for neoplasm: Secondary | ICD-10-CM | POA: Diagnosis not present

## 2020-09-27 DIAGNOSIS — I130 Hypertensive heart and chronic kidney disease with heart failure and stage 1 through stage 4 chronic kidney disease, or unspecified chronic kidney disease: Secondary | ICD-10-CM | POA: Diagnosis not present

## 2020-09-27 DIAGNOSIS — E1142 Type 2 diabetes mellitus with diabetic polyneuropathy: Secondary | ICD-10-CM | POA: Diagnosis not present

## 2020-10-01 DIAGNOSIS — E039 Hypothyroidism, unspecified: Secondary | ICD-10-CM | POA: Diagnosis not present

## 2020-10-01 DIAGNOSIS — I5032 Chronic diastolic (congestive) heart failure: Secondary | ICD-10-CM | POA: Diagnosis not present

## 2020-10-01 DIAGNOSIS — I480 Paroxysmal atrial fibrillation: Secondary | ICD-10-CM | POA: Diagnosis not present

## 2020-10-01 DIAGNOSIS — C519 Malignant neoplasm of vulva, unspecified: Secondary | ICD-10-CM | POA: Diagnosis not present

## 2020-10-01 DIAGNOSIS — I2510 Atherosclerotic heart disease of native coronary artery without angina pectoris: Secondary | ICD-10-CM | POA: Diagnosis not present

## 2020-10-01 DIAGNOSIS — I130 Hypertensive heart and chronic kidney disease with heart failure and stage 1 through stage 4 chronic kidney disease, or unspecified chronic kidney disease: Secondary | ICD-10-CM | POA: Diagnosis not present

## 2020-10-01 DIAGNOSIS — E1142 Type 2 diabetes mellitus with diabetic polyneuropathy: Secondary | ICD-10-CM | POA: Diagnosis not present

## 2020-10-01 DIAGNOSIS — G4733 Obstructive sleep apnea (adult) (pediatric): Secondary | ICD-10-CM | POA: Diagnosis not present

## 2020-10-01 DIAGNOSIS — Z483 Aftercare following surgery for neoplasm: Secondary | ICD-10-CM | POA: Diagnosis not present

## 2020-10-01 DIAGNOSIS — N189 Chronic kidney disease, unspecified: Secondary | ICD-10-CM | POA: Diagnosis not present

## 2020-10-01 NOTE — Progress Notes (Signed)
GYN Location of Tumor / Histology:  vulva  [name] presented with symptoms of: erythema, ulceration, and induration of the vulva Symptoms:   more painful than pruritic   Biopsies revealed:  07/10/2020 FINAL MICROSCOPIC DIAGNOSIS:   A. VULVA, RIGHT, BIOPSY:  - Invasive poorly differentiated squamous cell carcinoma.   07/26/2020 FINAL MICROSCOPIC DIAGNOSIS:   A. VULVA, 6 OCLOCK, INFERIOR ASPECT, BIOPSY:  - Superficial fragments of squamous cell carcinoma.   B. VULVA, 11 OCLOCK, BIOPSY:  - Invasive squamous cell carcinoma.  - No lymphovascular invasion.   C. VULVA, 2 OCLOCK, BIOPSY:  - Invasive squamous cell carcinoma.  - No lymphovascular invasion.   D. VULVA, CENTRAL, BIOPSY:  - Superficial fragment of squamous cell carcinoma.   Past/Anticipated interventions by Gyn/Onc surgery, if any:  (08/22/20)  OR Procedures:  Exam under anesthesia, right sentinel lymph node biopsy by Surgical Oncology, anterior modified radical vulvectomy   Past/Anticipated interventions by medical oncology, if any: Dr Berline Lopes recommend adjuvant RT. This was also the consensus recommendation after her case was presented at [facility] tumor board.   Weight changes, if any: no  Bowel/Bladder complaints, if any: Yes.  ,  diarrhea, urinary urgency, frequency  Nausea/Vomiting, if any: yes, nausea with occasional vomiting  Pain issues, if any:  no  SAFETY ISSUES: Prior radiation? no Pacemaker/ICD? yes Possible current pregnancy? no, postmenopausal Is the patient on methotrexate? no  Current Complaints / other details:  poor appetite  Vitals:   10/03/20 0940  BP: (!) 147/46  Pulse: 60  Resp: 18  Temp: (!) 97.1 F (36.2 C)  TempSrc: Temporal  SpO2: 99%  Weight: 177 lb 6 oz (80.5 kg)  Height: '5\' 2"'$  (1.575 m)

## 2020-10-02 DIAGNOSIS — I5033 Acute on chronic diastolic (congestive) heart failure: Secondary | ICD-10-CM | POA: Diagnosis not present

## 2020-10-02 DIAGNOSIS — I2510 Atherosclerotic heart disease of native coronary artery without angina pectoris: Secondary | ICD-10-CM | POA: Diagnosis not present

## 2020-10-02 DIAGNOSIS — I5032 Chronic diastolic (congestive) heart failure: Secondary | ICD-10-CM | POA: Diagnosis not present

## 2020-10-02 DIAGNOSIS — N189 Chronic kidney disease, unspecified: Secondary | ICD-10-CM | POA: Diagnosis not present

## 2020-10-02 DIAGNOSIS — Z483 Aftercare following surgery for neoplasm: Secondary | ICD-10-CM | POA: Diagnosis not present

## 2020-10-02 DIAGNOSIS — I130 Hypertensive heart and chronic kidney disease with heart failure and stage 1 through stage 4 chronic kidney disease, or unspecified chronic kidney disease: Secondary | ICD-10-CM | POA: Diagnosis not present

## 2020-10-02 DIAGNOSIS — E039 Hypothyroidism, unspecified: Secondary | ICD-10-CM | POA: Diagnosis not present

## 2020-10-02 DIAGNOSIS — R069 Unspecified abnormalities of breathing: Secondary | ICD-10-CM | POA: Diagnosis not present

## 2020-10-02 DIAGNOSIS — I480 Paroxysmal atrial fibrillation: Secondary | ICD-10-CM | POA: Diagnosis not present

## 2020-10-02 DIAGNOSIS — C519 Malignant neoplasm of vulva, unspecified: Secondary | ICD-10-CM | POA: Diagnosis not present

## 2020-10-02 DIAGNOSIS — E1142 Type 2 diabetes mellitus with diabetic polyneuropathy: Secondary | ICD-10-CM | POA: Diagnosis not present

## 2020-10-02 DIAGNOSIS — E1165 Type 2 diabetes mellitus with hyperglycemia: Secondary | ICD-10-CM | POA: Diagnosis not present

## 2020-10-02 DIAGNOSIS — G4733 Obstructive sleep apnea (adult) (pediatric): Secondary | ICD-10-CM | POA: Diagnosis not present

## 2020-10-02 NOTE — Progress Notes (Signed)
Radiation Oncology         [phone] ________________________________  Initial Outpatient Consultation  Name: [name] MRN: [id number]  Date: 10/03/2020  DOB: [birth year]  BL:TJQZE, Thayer Jew, MD  Lafonda Mosses, MD   REFERRING PHYSICIAN: Lafonda Mosses, MD  DIAGNOSIS: The encounter diagnosis was Vulvar cancer [facility]).  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 female who is accompanied by her son. she is seen as a courtesy of Dr, Berline Lopes for an opinion concerning radiation therapy as part of management for her recently diagnosed vulvar cancer. The patient initially presented to her [facility] Derrek Monaco, NP, on 06/28/20 with vaginal itching and burning. Burning persisted following this visit and she returned to her OBGYN on 07/10/20. During this visit, vaginal exam revealed an ulceration like area to the right inner labia; described as tender and erythematous on right at introitus.   Dr. Elonda Husky was present during this visit and collected a biopsy of the lesion. Biopsy of right vulva on 07/10/20 revealed: invasive poorly differentiated squamous cell carcinoma.   Accordingly, the patient was referred to Dr. Berline Lopes on 07/20/20 for further evaluation; additional biopsy was scheduled at this time as well as imaging and genetic counseling (due to strong family history of BRCA associated cancers).  Multiple biopsies performed under anesthesia by Dr. Berline Lopes on 07/26/20 revealed: superficial fragments of SCC from inferior and central vulvar biopsies, and invasive SCC from vulvar biopsies at the 11 o'clock and 2 o'clock positions.   PET performed on 08/06/20 demonstrated the midline perineal lesion along the vulva; with maximum SUV of 10.6, noted as likely corresponding to the reported vulvar lesion. No hypermetabolic adenopathy or findings of distant metastatic spread were otherwise visualized.    Genetic testing collected on 08/09/21 revealed no pathogenic variants detected (though one gene (POLD1) of uncertain significance was detected).   The patietn was then referred to Dr. Opal Sidles at [name] on 08/15/20 for surgical consultation. Options were discussed with both the patient and Dr. Berline Lopes at this time. The patient opted to proceed with the proposed plan of partial radical anterior vulvectomy and bilateral sentinel lymph node biopsies on 08/22/20 under the care of Dr. Berline Lopes. Pathology from the procedure revealed histology of grade 3 HPV-independent squamous cell carcinoma; with an invasion depth of 6 mm; margin status of invasive disease: negative; 1/1 inguinal lymph nodes negative for carcinoma.   Following procedure, the patient followed up with Joylene John, NP, for post-op check up. The patient was noted at this time to have experienced post-operative hypotension, hypoxic respiratory failure/ pulmonary edema. The patient also reported vulvar soreness/drainage, decreased appetite, and stated that her head "feels heavy at times". The patient was advised to clean the area properly with provided peri-bottle. She was discharged from her surgery on O2 at 3 L and appears to have a follow up with [facility] pulm in Nov. 2022. (Reports that she is able to walk for short distances with assistance).  Of note: patient has pacemaker  The patient was seen recently by Dr. Berline Lopes on September 7.  Patient was making good postoperative recovery however had been found to have superficial dehiscence of the majority of her incision.  There was no evidence of infection.  She discussed with the patient that this lesion would heal by secondary intent.  The patient's case was presented at the multidisciplinary gynecologic oncology tumor board at [facility].  It  was the recommendation that the patient received postoperative radiation therapy to the site of presentation given the close surgical margins.  In  addition it was recommended she receive radiation therapy to the left groin since this area was not surgically evaluated.   PREVIOUS RADIATION THERAPY: No  PAST MEDICAL HISTORY:  Past Medical History:  Diagnosis Date   Arthritis    Diabetes mellitus without complication ([place])    Dyspnea    Encounter for care of pacemaker 09/03/2018   Family history of kidney cancer    Family history of throat cancer    Family history of uterine cancer    History of chickenpox    Hx of psoriatic arthritis    Hyperlipemia    Hypertension    Hypertension 05/03/2018   Hypothyroidism    Mitral valve disorders(424.0)    Obstructive sleep apnea    Pacemaker    Paroxysmal atrial fibrillation ([place]) 10/11/2016   Peripheral neuropathy    Renal disorder Kidney disease stage2   Thyroid disease hypothyroid   Urine incontinence    Vitamin D deficiency     PAST SURGICAL HISTORY: Past Surgical History:  Procedure Laterality Date   BACK SURGERY     BACK SURGERY     CARDIAC CATHETERIZATION N/A 04/29/2015   Procedure: Temporary Pacemaker;  Surgeon: Adrian Prows, MD;  Location: [place] [facility];  Service: Cardiovascular;  Laterality: N/A;   CARDIOVERSION N/A 09/07/2018   Procedure: CARDIOVERSION;  Surgeon: Adrian Prows, MD;  Location: [place];  Service: Cardiovascular;  Laterality: N/A;   CARDIOVERSION     COLONOSCOPY     EP IMPLANTABLE DEVICE N/A 04/30/2015   Procedure: Pacemaker Implant;  Surgeon: Evans Lance, MD;  Location: [place] [facility];  Service: Cardiovascular;  Laterality: N/A;   INTRAVASCULAR PRESSURE WIRE/FFR STUDY N/A 08/31/2017   Procedure: INTRAVASCULAR PRESSURE WIRE/FFR STUDY;  Surgeon: Nigel Mormon, MD;  Location: [place] [facility];  Service: Cardiovascular;  Laterality: N/A;   LEFT AND RIGHT HEART CATHETERIZATION WITH CORONARY ANGIOGRAM N/A 09/30/2011   Procedure: LEFT AND RIGHT HEART CATHETERIZATION WITH CORONARY ANGIOGRAM;  Surgeon: Laverda Page, MD;  Location: [facility]  CATH LAB;  Service: Cardiovascular;  Laterality: N/A;   RIGHT/LEFT HEART CATH AND CORONARY ANGIOGRAPHY N/A 08/31/2017   Procedure: RIGHT/LEFT HEART CATH AND CORONARY ANGIOGRAPHY;  Surgeon: Nigel Mormon, MD;  Location: [place] [facility];  Service: Cardiovascular;  Laterality: N/A;   TOOTH EXTRACTION  10/07/2018   TUBAL LIGATION     VULVA Milagros Loll BIOPSY N/A 07/26/2020   Procedure: VULVAR BIOPSY;  Surgeon: Lafonda Mosses, MD;  Location: [facility];  Service: Gynecology;  Laterality: N/A;   YAG LASER APPLICATION Right 24/49/7530   Procedure: YAG LASER APPLICATION;  Surgeon: Elta Guadeloupe T. Gershon Crane, MD;  Location: [facility];  Service: Ophthalmology;  Laterality: Right;  pt knows to arrive at 05:11   YAG LASER APPLICATION Left 02/23/1733   Procedure: YAG LASER APPLICATION;  Surgeon: Rutherford Guys, MD;  Location: [facility];  Service: Ophthalmology;  Laterality: Left;    FAMILY HISTORY:  Family History  Problem Relation Age of Onset   Arthritis Mother    Diabetes Mother    Heart disease Mother    Hyperlipidemia Mother    Hypertension Mother    Arthritis Father    Asthma Father    Heart attack Father    Hyperlipidemia Father    Hypertension Father    Stroke Father    Arthritis Sister    Diabetes Sister  Hypertension Sister    Arthritis Sister    Diabetes Sister    Hyperlipidemia Sister    Hypertension Sister    Stroke Sister    Ovarian cancer Sister 54   Pancreatic cancer Sister 26   Uterine cancer Sister 13   Hyperlipidemia Sister    Hypertension Sister    Arthritis Brother    Heart attack Brother    Heart disease Brother    Hyperlipidemia Brother    Hypertension Brother    Alcohol abuse Brother    Arthritis Brother    Diabetes Brother    Early death Brother    Heart disease Brother    Hyperlipidemia Brother    Hypertension Brother    Cancer Maternal Aunt 70       unknown type   Throat cancer Maternal Grandfather        hx smoking/drinking   Hypertension Daughter     Cancer Daughter        "female cancer cells"   Kidney cancer Nephew        dx 15s   Colon cancer Neg Hx    Breast cancer Neg Hx    Prostate cancer Neg Hx     SOCIAL HISTORY:  Social History   Tobacco Use   Smoking status: Never   Smokeless tobacco: Never  Vaping Use   Vaping Use: Never used  Substance Use Topics   Alcohol use: No    Alcohol/week: 0.0 standard drinks   Drug use: No    ALLERGIES:  Allergies  Allergen Reactions   Hydrocodone Other (See Comments)    Lethargic    Invokana [Canagliflozin] Palpitations and Other (See Comments)    Made heart race   Oxycodone Other (See Comments)    Lethargic     MEDICATIONS:  Current Outpatient Medications  Medication Sig Dispense Refill   amiodarone (PACERONE) 200 MG tablet Take 1/2 (one-half) tablet by mouth once daily (Patient taking differently: Take 100 mg by mouth daily.) 45 tablet 3   ELIQUIS 5 MG TABS tablet Take 1 tablet by mouth twice daily 180 tablet 0   EUTHYROX 88 MCG tablet Take 88 mcg by mouth daily.     fenofibrate 160 MG tablet Take 160 mg by mouth daily with supper.      insulin aspart protamine- aspart (NOVOLOG MIX 70/30) (70-30) 100 UNIT/ML injection Inject 35-45 Units into the skin See admin instructions. Inject 45 units into the skin with breakfast, 35 units with supper (may adjust based on blood sugar readings)     isosorbide mononitrate (IMDUR) 60 MG 24 hr tablet Take 1 tablet (60 mg total) by mouth daily. 90 tablet 3   metoprolol tartrate (LOPRESSOR) 50 MG tablet Take 1 tablet (50 mg total) by mouth 2 (two) times daily. 180 tablet 3   nitroGLYCERIN (NITROSTAT) 0.4 MG SL tablet Place 1 tablet (0.4 mg total) under the tongue every 5 (five) minutes as needed for chest pain. 25 tablet 1   nystatin cream (MYCOSTATIN) APPLY  CREAM TOPICALLY TWICE DAILY AS NEEDED FOR  IRRIATATION  OF  AFFECTED  AREA 30 g 0   olmesartan-hydrochlorothiazide (BENICAR HCT) 40-25 MG tablet Take 1 tablet by mouth daily. 90 tablet 3    spironolactone (ALDACTONE) 25 MG tablet Take 1 tablet by mouth once daily (Patient taking differently: Take 25 mg by mouth daily.) 90 tablet 0   SURE COMFORT INSULIN SYRINGE 31G X 5/16" 0.5 ML MISC      amLODipine (NORVASC) 5 MG tablet 1  tablet     empagliflozin (JARDIANCE) 25 MG TABS tablet Take 1 tablet (25 mg total) by mouth daily before breakfast. (Patient not taking: Reported on 10/03/2020) 30 tablet 6   lidocaine (XYLOCAINE) 5 % ointment Apply 1 application topically 3 (three) times daily as needed. (Patient not taking: Reported on 10/03/2020) 35.44 g 2   torsemide (DEMADEX) 10 MG tablet Take 1 tablet (10 mg total) by mouth 2 (two) times daily. (Patient not taking: Reported on 10/03/2020) 30 tablet 3   traMADol (ULTRAM) 50 MG tablet Take 1 tablet (50 mg total) by mouth every 6 (six) hours as needed for severe pain. Do not take and drive (Patient not taking: Reported on 10/03/2020) 30 tablet 0   triamcinolone ointment (KENALOG) 0.5 % Apply topically 2 (two) times daily. (Patient not taking: Reported on 10/03/2020) 30 g 3   Vibegron (GEMTESA) 75 MG TABS Take 75 mg by mouth daily. (Patient not taking: Reported on 10/03/2020)     No current facility-administered medications for this encounter.    REVIEW OF SYSTEMS:  A 10+ POINT REVIEW OF SYSTEMS WAS OBTAINED including neurology, dermatology, psychiatry, cardiac, respiratory, lymph, extremities, GI, GU, musculoskeletal, constitutional, reproductive, HEENT.  The patient reports ongoing problems with diarrhea.  She reports this was somewhat of a problem before her surgery and this is persisted if not worsened.  She reports frequent stools approximately 8-10 a day but very low volume.  She denies any blood in her stools.  She denies any vaginal bleeding or discharge.  She denies any difficulties with urination or hematuria.   PHYSICAL EXAM:  height is 5' 2"  (1.575 m) and weight is 177 lb 6 oz (80.5 kg). Her temporal temperature is 97.1 F (36.2 C)  (abnormal). Her blood pressure is 147/46 (abnormal) and her pulse is 60. Her respiration is 18 and oxygen saturation is 99%.   General: Alert and oriented, in no acute distress HEENT: Head is normocephalic. Extraocular movements are intact.  Neck: Neck is supple, no palpable cervical or supraclavicular lymphadenopathy. Heart: Regular in rate and rhythm with no murmurs, rubs, or gallops. Chest: Clear to auscultation bilaterally, with no rhonchi, wheezes, or rales. Abdomen: Soft, nontender, nondistended, with no rigidity or guarding. Extremities: No cyanosis or edema. Lymphatics: see Neck Exam Skin: No concerning lesions. Musculoskeletal: symmetric strength and muscle tone throughout. Neurologic: Cranial nerves II through XII are grossly intact. No obvious focalities. Speech is fluent. Coordination is intact. Psychiatric: Judgment and insight are intact. Affect is appropriate. Pelvic exam not performed at the patient's request.  Will be performed at the time of her simulation and planning.  As above the patient was recently examined by Dr. Berline Lopes and was noted to have superficial dehiscence of her incision which will need to heal by secondary intent.  ECOG = 1  0 - Asymptomatic (Fully active, able to carry on all predisease activities without restriction)  1 - Symptomatic but completely ambulatory (Restricted in physically strenuous activity but ambulatory and able to carry out work of a light or sedentary nature. For example, light housework, office work)  2 - Symptomatic, <50% in bed during the day (Ambulatory and capable of all self care but unable to carry out any work activities. Up and about more than 50% of waking hours)  3 - Symptomatic, >50% in bed, but not bedbound (Capable of only limited self-care, confined to bed or chair 50% or more of waking hours)  4 - Bedbound (Completely disabled. Cannot carry on any self-care. Totally confined  to bed or chair)  5 - Death   Eustace Pen MM,  Creech RH, Tormey DC, et al. [phone]). "Toxicity and response criteria of the [facility] Group". [place] Oncol. 5 (6): 649-55  LABORATORY DATA:  Lab Results  Component Value Date   WBC 8.1 09/03/2020   HGB 9.9 (L) 09/03/2020   HCT 31.0 (L) 09/03/2020   MCV 90.4 09/03/2020   PLT 223 09/03/2020   NEUTROABS 5.0 09/03/2020   Lab Results  Component Value Date   NA 139 09/06/2020   K 4.9 09/06/2020   CL 92 (L) 09/06/2020   CO2 30 (H) 09/06/2020   GLUCOSE 151 (H) 09/06/2020   CREATININE 1.48 (H) 09/06/2020   CALCIUM 10.0 09/06/2020      RADIOGRAPHY: PCV CAROTID DUPLEX (BILATERAL)  Result Date: 09/09/2020 Carotid artery duplex 09/06/2020: Duplex suggests stenosis in the right internal carotid artery (16-49%). Duplex suggests stenosis in the right external carotid artery (<50%). Duplex suggests stenosis in the left internal carotid artery (16-49%). Antegrade right vertebral artery flow. Antegrade left vertebral artery flow. Compared to 02/09/2014, mild progression of disease in bilateral carotid arteries. Follow up in one year is appropriate if clinically indicated.     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  This lesion measured 4 cm in size.  Due to comorbidities and cardiac issues during surgery the patient could not have a complete radical resection.  In addition the left groin was not surgically evaluated because of the above issues.  I would agree with [facility] recommendations for postoperative radiation therapy directed at the vulvar region given the close surgical margins and poorly differentiated lesion.  The invasive margin was noted to be 1 mm on the final margin assessment.  Also would agree with elective coverage of the left inguinal area given that this area was not surgically  evaluated.  Today, I talked to the patient and son about the findings and work-up thus far.  We discussed the natural history of vulvar carcinoma and general treatment, highlighting the role of radiotherapy in the management.  We discussed the available radiation techniques, and focused on the details of logistics and delivery.  We reviewed the anticipated acute and late sequelae associated with radiation in this setting.  The patient was encouraged to ask questions that I answered to the best of my ability.  A patient consent form was discussed and signed.  We retained a copy for our records.  The patient would like to proceed with radiation and will be scheduled for CT simulation.  PLAN: The patient will be seen by Dr. Berline Lopes on September 30.  Depending on this exam we can then schedule her CT simulation and subsequent treatment.  Anticipate 6 weeks of postoperative radiation therapy.  We will use intensity modulated radiation therapy techniques to allow for coverage over the inguinal area and to spare the left femoral head and neck region.   60 minutes of total time was spent for this patient encounter, including preparation, face-to-face counseling with the patient and coordination of care, physical exam, and documentation of the encounter.   ------------------------------------------------  Blair Promise, PhD, MD  This document serves as a record of services personally performed by Gery Pray, MD. It was created on his behalf by Roney Mans, a trained medical scribe. The creation of this  record is based on the scribe's personal observations and the provider's statements to them. This document has been checked and approved by the attending provider.

## 2020-10-03 ENCOUNTER — Encounter: Payer: Self-pay | Admitting: Radiation Oncology

## 2020-10-03 ENCOUNTER — Other Ambulatory Visit: Payer: Self-pay

## 2020-10-03 ENCOUNTER — Ambulatory Visit
Admission: RE | Admit: 2020-10-03 | Discharge: 2020-10-03 | Disposition: A | Discharge status: Home / Self Care | Payer: Medicare HMO | Source: Ambulatory Visit | Attending: Radiation Oncology | Admitting: Radiation Oncology

## 2020-10-03 VITALS — BP 147/46 | HR 60 | Temp 97.1°F | Resp 18 | Ht 62.0 in | Wt 177.4 lb

## 2020-10-03 DIAGNOSIS — T8131XA Disruption of external operation (surgical) wound, not elsewhere classified, initial encounter: Secondary | ICD-10-CM | POA: Diagnosis not present

## 2020-10-03 DIAGNOSIS — Z794 Long term (current) use of insulin: Secondary | ICD-10-CM | POA: Diagnosis not present

## 2020-10-03 DIAGNOSIS — Z801 Family history of malignant neoplasm of trachea, bronchus and lung: Secondary | ICD-10-CM | POA: Diagnosis not present

## 2020-10-03 DIAGNOSIS — G629 Polyneuropathy, unspecified: Secondary | ICD-10-CM | POA: Insufficient documentation

## 2020-10-03 DIAGNOSIS — I4891 Unspecified atrial fibrillation: Secondary | ICD-10-CM | POA: Insufficient documentation

## 2020-10-03 DIAGNOSIS — E559 Vitamin D deficiency, unspecified: Secondary | ICD-10-CM | POA: Insufficient documentation

## 2020-10-03 DIAGNOSIS — Z8051 Family history of malignant neoplasm of kidney: Secondary | ICD-10-CM | POA: Diagnosis not present

## 2020-10-03 DIAGNOSIS — E119 Type 2 diabetes mellitus without complications: Secondary | ICD-10-CM | POA: Diagnosis not present

## 2020-10-03 DIAGNOSIS — G4730 Sleep apnea, unspecified: Secondary | ICD-10-CM | POA: Diagnosis not present

## 2020-10-03 DIAGNOSIS — Z7901 Long term (current) use of anticoagulants: Secondary | ICD-10-CM | POA: Diagnosis not present

## 2020-10-03 DIAGNOSIS — E039 Hypothyroidism, unspecified: Secondary | ICD-10-CM | POA: Insufficient documentation

## 2020-10-03 DIAGNOSIS — C519 Malignant neoplasm of vulva, unspecified: Secondary | ICD-10-CM | POA: Diagnosis not present

## 2020-10-03 DIAGNOSIS — I129 Hypertensive chronic kidney disease with stage 1 through stage 4 chronic kidney disease, or unspecified chronic kidney disease: Secondary | ICD-10-CM | POA: Diagnosis not present

## 2020-10-03 DIAGNOSIS — M129 Arthropathy, unspecified: Secondary | ICD-10-CM | POA: Diagnosis not present

## 2020-10-03 DIAGNOSIS — N182 Chronic kidney disease, stage 2 (mild): Secondary | ICD-10-CM | POA: Diagnosis not present

## 2020-10-03 DIAGNOSIS — Z79899 Other long term (current) drug therapy: Secondary | ICD-10-CM | POA: Diagnosis not present

## 2020-10-03 DIAGNOSIS — E785 Hyperlipidemia, unspecified: Secondary | ICD-10-CM | POA: Insufficient documentation

## 2020-10-03 NOTE — Progress Notes (Signed)
See MD note for nursing evaluation. °

## 2020-10-04 DIAGNOSIS — Z483 Aftercare following surgery for neoplasm: Secondary | ICD-10-CM | POA: Diagnosis not present

## 2020-10-04 DIAGNOSIS — I130 Hypertensive heart and chronic kidney disease with heart failure and stage 1 through stage 4 chronic kidney disease, or unspecified chronic kidney disease: Secondary | ICD-10-CM | POA: Diagnosis not present

## 2020-10-04 DIAGNOSIS — C519 Malignant neoplasm of vulva, unspecified: Secondary | ICD-10-CM | POA: Diagnosis not present

## 2020-10-04 DIAGNOSIS — G4733 Obstructive sleep apnea (adult) (pediatric): Secondary | ICD-10-CM | POA: Diagnosis not present

## 2020-10-04 DIAGNOSIS — I2510 Atherosclerotic heart disease of native coronary artery without angina pectoris: Secondary | ICD-10-CM | POA: Diagnosis not present

## 2020-10-04 DIAGNOSIS — I480 Paroxysmal atrial fibrillation: Secondary | ICD-10-CM | POA: Diagnosis not present

## 2020-10-04 DIAGNOSIS — E1142 Type 2 diabetes mellitus with diabetic polyneuropathy: Secondary | ICD-10-CM | POA: Diagnosis not present

## 2020-10-04 DIAGNOSIS — N189 Chronic kidney disease, unspecified: Secondary | ICD-10-CM | POA: Diagnosis not present

## 2020-10-04 DIAGNOSIS — I5032 Chronic diastolic (congestive) heart failure: Secondary | ICD-10-CM | POA: Diagnosis not present

## 2020-10-04 DIAGNOSIS — E039 Hypothyroidism, unspecified: Secondary | ICD-10-CM | POA: Diagnosis not present

## 2020-10-08 NOTE — Progress Notes (Signed)
Primary Physician/Referring:  Deland Pretty, MD  Patient ID: [name], female    DOB: 1945/02/04, 75 y.o.   MRN: [id number]  Chief Complaint  Patient presents with   Chronic diastolic heart failure    Follow-up   Hypertension    HPI:    [name]  is a 75 y.o. female  with hypertension, type 2 diabetes mellitus with stage 3 CKD, chronic diastolic heart failure, mild hyperlipidemia on Tricor, nonobstructive CAD (Cath 2019), sick sinus syndrome status post dual-chamber pacemaker 2017, OSA on BiPAP, paroxysmal Afib and atrial flutter/atrial tachycardia.  Her last cardioversion was 09/07/2018 for atrial flutter.  Patient was diagnosed with squamous cell carcinoma of the vulva and underwent colectomy 08/22/2020 at [facility]  Postoperatively patient experienced complex complications including hypotension, hypoxic respiratory failure/pulmonary edema, surgical site infection, acute on chronic CKD.  Patient was discharged home with new supplemental oxygen requirement of 3 L.  During postoperative period patient experienced hypotension requiring pressor support.  She subsequently developed acute on chronic diastolic heart failure requiring diuresis.    Patient presents for 4-week follow-up of chronic diastolic heart failure and fatigue. At last office visit no changes were made to medications. She is presently tolerating GDMT including: metoprolol, Jardiance, olmesartan, spironolactone.  Patient is feeling better than she has in quite a while, however she does continue to struggle to sleep at night.  Reports she only gets a few hours of sleep each night.  She reports continued daytime fatigue.  Denies chest pain, dyspnea, syncope, near syncope, leg edema, orthopnea, PND.  Notably patient is planning to start radiation therapy 10/22/2020 for vulvar cancer.  Past Medical History:  Diagnosis Date   Arthritis    Diabetes mellitus without complication ([place])    Dyspnea    Encounter for care of  pacemaker 09/03/2018   Family history of kidney cancer    Family history of throat cancer    Family history of uterine cancer    History of chickenpox    Hx of psoriatic arthritis    Hyperlipemia    Hypertension    Hypertension 05/03/2018   Hypothyroidism    Mitral valve disorders(424.0)    Obstructive sleep apnea    Pacemaker    Paroxysmal atrial fibrillation ([place]) 10/11/2016   Peripheral neuropathy    Renal disorder Kidney disease stage2   Thyroid disease hypothyroid   Urine incontinence    Vitamin D deficiency    Past Surgical History:  Procedure Laterality Date   BACK SURGERY     BACK SURGERY     CARDIAC CATHETERIZATION N/A 04/29/2015   Procedure: Temporary Pacemaker;  Surgeon: Adrian Prows, MD;  Location: [place] [facility];  Service: Cardiovascular;  Laterality: N/A;   CARDIOVERSION N/A 09/07/2018   Procedure: CARDIOVERSION;  Surgeon: Adrian Prows, MD;  Location: [place];  Service: Cardiovascular;  Laterality: N/A;   CARDIOVERSION     COLONOSCOPY     EP IMPLANTABLE DEVICE N/A 04/30/2015   Procedure: Pacemaker Implant;  Surgeon: Evans Lance, MD;  Location: [place] [facility];  Service: Cardiovascular;  Laterality: N/A;   INTRAVASCULAR PRESSURE WIRE/FFR STUDY N/A 08/31/2017   Procedure: INTRAVASCULAR PRESSURE WIRE/FFR STUDY;  Surgeon: Nigel Mormon, MD;  Location: [place] [facility];  Service: Cardiovascular;  Laterality: N/A;   LEFT AND RIGHT HEART CATHETERIZATION WITH CORONARY ANGIOGRAM N/A 09/30/2011   Procedure: LEFT AND RIGHT HEART CATHETERIZATION WITH CORONARY ANGIOGRAM;  Surgeon: Laverda Page, MD;  Location: [facility] C[facility] LAB;  Service: Cardiovascular;  Laterality: N/A;   RIGHT/LEFT HEART CATH AND CORONARY ANGIOGRAPHY N/A 08/31/2017   Procedure: RIGHT/LEFT HEART CATH AND CORONARY ANGIOGRAPHY;  Surgeon: Nigel Mormon, MD;  Location: [place] [facility];  Service: Cardiovascular;  Laterality: N/A;   TOOTH EXTRACTION  10/07/2018   TUBAL LIGATION      VULVA Milagros Loll BIOPSY N/A 07/26/2020   Procedure: VULVAR BIOPSY;  Surgeon: Lafonda Mosses, MD;  Location: [facility];  Service: Gynecology;  Laterality: N/A;   YAG LASER APPLICATION Right 65/68/1275   Procedure: YAG LASER APPLICATION;  Surgeon: Elta Guadeloupe T. Gershon Crane, MD;  Location: [facility];  Service: Ophthalmology;  Laterality: Right;  pt knows to arrive at 17:00   YAG LASER APPLICATION Left 17/49/4496   Procedure: YAG LASER APPLICATION;  Surgeon: Rutherford Guys, MD;  Location: [facility];  Service: Ophthalmology;  Laterality: Left;   Family History  Problem Relation Age of Onset   Arthritis Mother    Diabetes Mother    Heart disease Mother    Hyperlipidemia Mother    Hypertension Mother    Arthritis Father    Asthma Father    Heart attack Father    Hyperlipidemia Father    Hypertension Father    Stroke Father    Arthritis Sister    Diabetes Sister    Hypertension Sister    Arthritis Sister    Diabetes Sister    Hyperlipidemia Sister    Hypertension Sister    Stroke Sister    Ovarian cancer Sister 66   Pancreatic cancer Sister 33   Uterine cancer Sister 71   Hyperlipidemia Sister    Hypertension Sister    Arthritis Brother    Heart attack Brother    Heart disease Brother    Hyperlipidemia Brother    Hypertension Brother    Alcohol abuse Brother    Arthritis Brother    Diabetes Brother    Early death Brother    Heart disease Brother    Hyperlipidemia Brother    Hypertension Brother    Cancer Maternal Aunt 70       unknown type   Throat cancer Maternal Grandfather        hx smoking/drinking   Hypertension Daughter    Cancer Daughter        "female cancer cells"   Kidney cancer Nephew        dx 55s   Colon cancer Neg Hx    Breast cancer Neg Hx    Prostate cancer Neg Hx     Social History   Tobacco Use   Smoking status: Never   Smokeless tobacco: Never  Substance Use Topics   Alcohol use: No    Alcohol/week: 0.0 standard drinks   Marital Status: Married  ROS   Review of Systems  Constitutional: Positive for malaise/fatigue. Negative for weight gain.  Cardiovascular:  Negative for chest pain, claudication, dyspnea on exertion, leg swelling, near-syncope, orthopnea (resolved), palpitations, paroxysmal nocturnal dyspnea and syncope.  Hematologic/Lymphatic: Does not bruise/bleed easily.  Neurological:  Negative for dizziness (improved).  Objective  Blood pressure (!) 134/43, pulse 60, resp. rate 16, height _0  (1.575 m), weight 177 lb 12.8 oz (80.6 kg), SpO2 92 %.  Vitals with BMI 10/09/2020 10/03/2020 09/19/2020  Height _1  _2  _3   Weight 177 lbs 13 oz 177 lbs 6 oz 175 lbs 3 oz  BMI 32.51 75.91 63.84  Systolic 665 993 570  Diastolic 43 46 44  Pulse 60 60 59     Physical Exam  Vitals reviewed.  Constitutional:      Appearance: She is well-developed. She is obese.     Comments: Patient appears more energetic and previous to office visits.  She is walking without assistive device, and attends visit independently.   Neck:     Vascular: No JVD.  Cardiovascular:     Rate and Rhythm: Normal rate and regular rhythm.     Pulses: Intact distal pulses.          Carotid pulses are 2+ on the right side and 2+ on the left side with bruit.      Popliteal pulses are 1+ on the right side and 1+ on the left side.       Dorsalis pedis pulses are 1+ on the right side and 1+ on the left side.       Posterior tibial pulses are 1+ on the right side and 1+ on the left side.     Heart sounds: S1 normal and S2 normal. Murmur heard.  Midsystolic murmur is present with a grade of 2/6 at the upper right sternal border and apex.    No gallop.  Pulmonary:     Effort: Pulmonary effort is normal. No accessory muscle usage or respiratory distress.     Breath sounds: Normal breath sounds. No wheezing, rhonchi or rales.  Musculoskeletal:     Right lower leg: No edema.     Left lower leg: No edema.  Neurological:     Mental Status: She is alert.   Laboratory  examination:   Recent Labs    07/26/20 1115 09/03/20 1224 09/06/20 1323  NA 139 141 139  K 4.0 5.1 4.9  CL 105 103 92*  CO2 27 29 30*  GLUCOSE 158* 138* 151*  BUN 22 28* 40*  CREATININE 1.12* 1.41* 1.48*  CALCIUM 10.3 10.3 10.0  GFRNONAA 51* 38*  --    CrCl cannot be calculated (Patient's most recent lab result is older than the maximum 21 days allowed.).  CMP Latest Ref Rng & Units 09/06/2020 09/03/2020 07/26/2020  Glucose 65 - 99 mg/dL 151(H) 138(H) 158(H)  BUN 8 - 27 mg/dL 40(H) 28(H) 22  Creatinine 0.57 - 1.00 mg/dL 1.48(H) 1.41(H) 1.12(H)  Sodium 134 - 144 mmol/L 139 141 139  Potassium 3.5 - 5.2 mmol/L 4.9 5.1 4.0  Chloride 96 - 106 mmol/L 92(L) 103 105  CO2 20 - 29 mmol/L 30(H) 29 27  Calcium 8.7 - 10.3 mg/dL 10.0 10.3 10.3  Total Protein 6.5 - 8.1 g/dL - 6.8 -  Total Bilirubin 0.3 - 1.2 mg/dL - 0.3 -  Alkaline Phos 38 - 126 U/L - 38 -  AST 15 - 41 U/L - 13(L) -  ALT 0 - 44 U/L - 13 -   CBC Latest Ref Rng & Units 09/03/2020 07/26/2020 08/26/2019  WBC 4.0 - 10.5 K/uL 8.1 7.9 7.1  Hemoglobin 12.0 - 15.0 g/dL 9.9(L) 13.4 13.6  Hematocrit 36.0 - 46.0 % 31.0(L) 41.7 43.0  Platelets 150 - 400 K/uL 223 148(L) 155   Lipid Panel     Component Value Date/Time   CHOL 168 08/29/2017 0328   TRIG 544 (H) 08/29/2017 0328   HDL 19 (L) 08/29/2017 0328   CHOLHDL 8.8 08/29/2017 0328   VLDL UNABLE TO CALCULATE IF TRIGLYCERIDE OVER 400 mg/dL 08/29/2017 0328   LDLCALC UNABLE TO CALCULATE IF TRIGLYCERIDE OVER 400 mg/dL 08/29/2017 0328   HEMOGLOBIN A1C Lab Results  Component Value Date   HGBA1C 7.8 (H) 07/26/2020  MPG 177.16 07/26/2020   TSH No results for input(s): TSH in the last 8760 hours.   External labs:  10/25/2019: Sodium 142, glucose 135, BUN 24, GFR 42, potassium 5.5, AST 22, ALT 37, alk phos 25 A1c 6.9% Total cholesterol 177, triglycerides 240, LDL 100, HDL 29 TSH 3.61  Cholesterol, total 168.000 m 08/29/2017 HDL 17.000 mg 06/09/2019 LDL-C 100.000 ca  06/09/2019 Triglycerides 343.000 m 06/09/2019  A1C 7.200 % 06/24/2019 TSH 5.100 08/31/2019  Hemoglobin 13.600 g/d 08/26/2019 Platelets 155.000 K/ 08/26/2019  Creatinine, Serum 1.340 mg/ 08/26/2019 Potassium 4.700 mm 08/26/2019 Magnesium 1.700 MG/ 04/24/2015 ALT (SGPT) 21.000 IU/ 06/24/2019  05/21/2018: HbA1c 6.7. Lipid Panel: Chol 146, Trig 321, HDL 17, LDL 65, VLDL 64. ALK Ph 32.   08/28/17: TSH Normal  Allergies   Allergies  Allergen Reactions   Hydrocodone Other (See Comments)    Lethargic    Invokana [Canagliflozin] Palpitations and Other (See Comments)    Made heart race   Oxycodone Other (See Comments)    Lethargic     Medications Prior to Visit:   Outpatient Medications Prior to Visit  Medication Sig Dispense Refill   amiodarone (PACERONE) 200 MG tablet Take 1/2 (one-half) tablet by mouth once daily (Patient taking differently: Take 100 mg by mouth daily.) 45 tablet 3   amLODipine (NORVASC) 5 MG tablet 1 tablet     ELIQUIS 5 MG TABS tablet Take 1 tablet by mouth twice daily 180 tablet 0   empagliflozin (JARDIANCE) 25 MG TABS tablet Take 1 tablet (25 mg total) by mouth daily before breakfast. 30 tablet 6   EUTHYROX 88 MCG tablet Take 88 mcg by mouth daily.     fenofibrate 160 MG tablet Take 160 mg by mouth daily with supper.      insulin aspart protamine- aspart (NOVOLOG MIX 70/30) (70-30) 100 UNIT/ML injection Inject 35-45 Units into the skin See admin instructions. Inject 45 units into the skin with breakfast, 35 units with supper (may adjust based on blood sugar readings)     isosorbide mononitrate (IMDUR) 60 MG 24 hr tablet Take 1 tablet (60 mg total) by mouth daily. 90 tablet 3   lidocaine (XYLOCAINE) 5 % ointment Apply 1 application topically 3 (three) times daily as needed. 35.44 g 2   metoprolol tartrate (LOPRESSOR) 50 MG tablet Take 1 tablet (50 mg total) by mouth 2 (two) times daily. 180 tablet 3   nitroGLYCERIN (NITROSTAT) 0.4 MG SL tablet Place 1 tablet (0.4 mg  total) under the tongue every 5 (five) minutes as needed for chest pain. 25 tablet 1   olmesartan-hydrochlorothiazide (BENICAR HCT) 40-25 MG tablet Take 1 tablet by mouth daily. 90 tablet 3   spironolactone (ALDACTONE) 25 MG tablet Take 1 tablet by mouth once daily (Patient taking differently: Take 25 mg by mouth daily.) 90 tablet 0   SURE COMFORT INSULIN SYRINGE 31G X 5/16" 0.5 ML MISC      traMADol (ULTRAM) 50 MG tablet Take 1 tablet (50 mg total) by mouth every 6 (six) hours as needed for severe pain. Do not take and drive 30 tablet 0   torsemide (DEMADEX) 10 MG tablet Take 1 tablet (10 mg total) by mouth 2 (two) times daily. 30 tablet 3   nystatin cream (MYCOSTATIN) APPLY  CREAM TOPICALLY TWICE DAILY AS NEEDED FOR  IRRIATATION  OF  AFFECTED  AREA 30 g 0   triamcinolone ointment (KENALOG) 0.5 % Apply topically 2 (two) times daily. 30 g 3   Vibegron (GEMTESA) 75 MG  TABS Take 75 mg by mouth daily. (Patient not taking: Reported on 10/03/2020)     No facility-administered medications prior to visit.   Final Medications at End of Visit    Current Meds  Medication Sig   amiodarone (PACERONE) 200 MG tablet Take 1/2 (one-half) tablet by mouth once daily (Patient taking differently: Take 100 mg by mouth daily.)   amLODipine (NORVASC) 5 MG tablet 1 tablet   ELIQUIS 5 MG TABS tablet Take 1 tablet by mouth twice daily   empagliflozin (JARDIANCE) 25 MG TABS tablet Take 1 tablet (25 mg total) by mouth daily before breakfast.   EUTHYROX 88 MCG tablet Take 88 mcg by mouth daily.   fenofibrate 160 MG tablet Take 160 mg by mouth daily with supper.    insulin aspart protamine- aspart (NOVOLOG MIX 70/30) (70-30) 100 UNIT/ML injection Inject 35-45 Units into the skin See admin instructions. Inject 45 units into the skin with breakfast, 35 units with supper (may adjust based on blood sugar readings)   isosorbide mononitrate (IMDUR) 60 MG 24 hr tablet Take 1 tablet (60 mg total) by mouth daily.   lidocaine  (XYLOCAINE) 5 % ointment Apply 1 application topically 3 (three) times daily as needed.   metoprolol tartrate (LOPRESSOR) 50 MG tablet Take 1 tablet (50 mg total) by mouth 2 (two) times daily.   nitroGLYCERIN (NITROSTAT) 0.4 MG SL tablet Place 1 tablet (0.4 mg total) under the tongue every 5 (five) minutes as needed for chest pain.   olmesartan-hydrochlorothiazide (BENICAR HCT) 40-25 MG tablet Take 1 tablet by mouth daily.   spironolactone (ALDACTONE) 25 MG tablet Take 1 tablet by mouth once daily (Patient taking differently: Take 25 mg by mouth daily.)   SURE COMFORT INSULIN SYRINGE 31G X 5/16" 0.5 ML MISC    traMADol (ULTRAM) 50 MG tablet Take 1 tablet (50 mg total) by mouth every 6 (six) hours as needed for severe pain. Do not take and drive   Radiology:   No results found.  Cardiac Studies:    Lexiscan myoview stress test 06/15/2017: 1. Lexiscan stress test was performed. Exercise capacity was not assessed. Stress symptoms included dizziness. Peak blood pressure 158/66 mmHg. Stress EKG is non diagnostic for ischemia as it is a pharmacologic stress. In addition, it demonstrated atrial pacing and incomplete LBBB. 2. The overall quality of the study is excellent. There is no evidence of abnormal lung activity. Stress and rest SPECT images demonstrate homogeneous tracer distribution throughout the myocardium. Gated SPECT imaging reveals normal myocardial thickening and wall motion. The left ventricular ejection fraction was normal (71%). 3. Low risk study.   Sleep Study 08/11/2017: Positive for Complex Sleep Apnea; On CPAP    R&LHC 08/31/2017: LCx: Nondominant. AV grove LCx 50-60% diffuse disease. RCA: Large dominant. Ostial PDA 50% stenosis, FFR 0.96. No change from 09/30/2011. RA: 5 mmHg RV: 45/4 mmHg. PA: 40/12 mmHg. Mean PA 29 mmHg PCWP: 16 mmHg LV: 130/3 mmHg, LVEDP 13 mmHg CO: 3.6 L/min. CI 1.9 L/min/m2  Direct current cardioversion 09/07/2018: Indication symptomatic A.  Flutter. Procedure: Using 50 mg of IV Propofol and 20 IV Lidocaine (for reducing venous pain) for achieving deep sedation, synchronized direct current cardioversion performed. Patient was delivered with 120 Joules of electricity X 1 with success to A-Paced Rhythm. Patient tolerated the procedure well. No immediate complication noted.   External echocardiogram 08/23/2020:   1. The left ventricle is normal in size with mildly increased wall thickness.    2. The left ventricular systolic function  is normal, LVEF is visually estimated at > 55%. There is grade III diastolic dysfunction (severely elevated filling pressure).    3. Mitral annular calcification is present (mild).    4. The mitral valve leaflets are mildly thickened with normal leaflet mobility.    5. There is mild mitral valve regurgitation.    6. The aortic valve is trileaflet with mildly thickened leaflets with normal excursion.    7. The left atrium is mildly dilated in size.    8. The right ventricle is upper normal in size, with normal systolic function.   Carotid artery duplex 09/06/2020:  Duplex suggests stenosis in the right internal carotid artery (16-49%).  Duplex suggests stenosis in the right external carotid artery (<50%).  Duplex suggests stenosis in the left internal carotid artery (16-49%).  Antegrade right vertebral artery flow. Antegrade left vertebral artery flow.  Compared to 02/09/2014, mild progression of disease in bilateral carotid arteries. Follow up in one year is appropriate if clinically indicated.  Pacemaker:  Remote dual-chamber pacemaker transmission 06/20/2020: Longevity 9 years. Lead impedance and thresholds within normal limits. There was no mode switch, no ventricular high rate episodes. AP 99 %, VP 6%. Normal pacemaker function.  Scheduled In office pacemaker check 08/30/2018:  There were 6 AHR Episodes Since 08/08/2018, EGM recordings reveal maximum a rate between 180-201 bpm,, The longest His  persistent since 08/30/2018,Atrial rate 180, ventricular rate 1 28 bpm, EGM's suggest atrial tachycardia/AFL. There was 0.4% truly due to atrial arrhythmia burden, pacemaker thresholds and impedance stable. Longevity 8.5, 12 years  EKG:   05/18/2020: Atrial paced rhythm with first-degree AV block at a rate of 60 bpm.  Left axis, left anterior fascicular block.  Poor R wave progression, cannot exclude anteroseptal infarct old.  IVCD.  Compared to EKG 11/10/2019, no significant change.  11/10/2019: Atrially paced rhythm with first-degree AV block, ventricularly sensed.  Ventricular rate 60 bpm.  Left axis deviation, left intrafascicular block.  Anteroseptal infarct old.  IVCD, borderline criteria for LVH.  Nonspecific T abnormality.  No significant change from 01/11/2019.  08/30/2018: Atypical atrial flutter with 3:1 conduction, V rate 110 bpm, left axis deviation. LBBB.   Assessment     ICD-10-CM   1. Chronic diastolic heart failure (HCC)  I50.32 Brain natriuretic peptide    2. Essential hypertension  I10     3. Bilateral carotid artery stenosis  I65.23     4. Paroxysmal atrial fibrillation (HCC)  I48.0     5. Coronary artery disease involving native coronary artery of native heart without angina pectoris  I25.10 Lipid Panel With LDL/HDL Ratio      No orders of the defined types were placed in this encounter.    Medications Discontinued During This Encounter  Medication Reason   nystatin cream (MYCOSTATIN) Error   triamcinolone ointment (KENALOG) 0.5 % Error   Vibegron (GEMTESA) 75 MG TABS Error     This patients CHA2DS2-VASc Score 7 (CHF, HTN, DM, Vasc, Age, F) and yearly risk of stroke >9.8%.   Recommendations:   [name]  is a 75 y.o. female  with hypertension, type 2 diabetes mellitus with stage 3 CKD, chronic diastolic heart failure, mild hyperlipidemia on fenobibrate, nonobstructive CAD (Cath 2019), sick sinus syndrome status post dual-chamber pacemaker 2017, OSA on  BiPAP, paroxysmal Afib and atrial flutter/atrial tachycardia.  Her last cardioversion was 09/07/2018 for atrial flutter.  Patient was diagnosed with squamous cell carcinoma of the vulva and underwent colectomy 08/22/2020 at [facility].  Postoperatively patient  experienced complex complications including hypotension, hypoxic respiratory failure/pulmonary edema, surgical site infection, acute on chronic CKD.  Patient was discharged home with new supplemental oxygen requirement of 3 L.  During postoperative period patient experienced hypotension requiring pressor support.  She subsequently developed acute on chronic diastolic heart failure requiring diuresis.    Patient presents for 4-week follow-up of chronic diastolic heart failure and fatigue. At last office visit no changes were made to medications. She is presently tolerating GDMT including: metoprolol, Jardiance, olmesartan, spironolactone.  Patient's blood pressure is relatively well controlled.  Will not make changes to cardiovascular medications at this time.  Patient is due for lipid profile testing, therefore will obtain this.  We will also repeat BNP as patient continues to experience fatigue.  However suspect fatigue is likely multifactorial including deconditioning as well as poor sleep.  Advised patient to follow-up with PCP for further evaluation.  In regard to carotid artery disease, will continue surveillance in 1 year.  Patient is tolerating Eliquis without bleeding diathesis, therefore she is also not on aspirin.  Follow-up in 3 months, sooner if needed, for HFpEF, hypertension, hyperlipidemia, sick sinus syndrome status post pacemaker implantation.   [name] Berthold, PA-C 10/09/2020, 4:26 PM Office: [phone]

## 2020-10-09 ENCOUNTER — Ambulatory Visit: Payer: Medicare HMO | Admitting: Student

## 2020-10-09 ENCOUNTER — Other Ambulatory Visit: Payer: Self-pay

## 2020-10-09 ENCOUNTER — Encounter: Payer: Self-pay | Admitting: Student

## 2020-10-09 VITALS — BP 134/43 | HR 60 | Resp 16 | Ht 62.0 in | Wt 177.8 lb

## 2020-10-09 DIAGNOSIS — I5032 Chronic diastolic (congestive) heart failure: Secondary | ICD-10-CM | POA: Diagnosis not present

## 2020-10-09 DIAGNOSIS — E1142 Type 2 diabetes mellitus with diabetic polyneuropathy: Secondary | ICD-10-CM | POA: Diagnosis not present

## 2020-10-09 DIAGNOSIS — N189 Chronic kidney disease, unspecified: Secondary | ICD-10-CM | POA: Diagnosis not present

## 2020-10-09 DIAGNOSIS — I480 Paroxysmal atrial fibrillation: Secondary | ICD-10-CM | POA: Diagnosis not present

## 2020-10-09 DIAGNOSIS — I6523 Occlusion and stenosis of bilateral carotid arteries: Secondary | ICD-10-CM | POA: Diagnosis not present

## 2020-10-09 DIAGNOSIS — G4733 Obstructive sleep apnea (adult) (pediatric): Secondary | ICD-10-CM | POA: Diagnosis not present

## 2020-10-09 DIAGNOSIS — I2510 Atherosclerotic heart disease of native coronary artery without angina pectoris: Secondary | ICD-10-CM

## 2020-10-09 DIAGNOSIS — I10 Essential (primary) hypertension: Secondary | ICD-10-CM | POA: Diagnosis not present

## 2020-10-09 DIAGNOSIS — E039 Hypothyroidism, unspecified: Secondary | ICD-10-CM | POA: Diagnosis not present

## 2020-10-09 DIAGNOSIS — Z483 Aftercare following surgery for neoplasm: Secondary | ICD-10-CM | POA: Diagnosis not present

## 2020-10-09 DIAGNOSIS — C519 Malignant neoplasm of vulva, unspecified: Secondary | ICD-10-CM | POA: Diagnosis not present

## 2020-10-09 DIAGNOSIS — I130 Hypertensive heart and chronic kidney disease with heart failure and stage 1 through stage 4 chronic kidney disease, or unspecified chronic kidney disease: Secondary | ICD-10-CM | POA: Diagnosis not present

## 2020-10-09 NOTE — Patient Instructions (Signed)
Go to [facility] for labs in a fasting state in the next 2 weeks.

## 2020-10-11 ENCOUNTER — Institutional Professional Consult (permissible substitution): Payer: Medicare HMO | Admitting: Emergency Medicine

## 2020-10-12 ENCOUNTER — Inpatient Hospital Stay: Payer: Medicare HMO | Admitting: Gynecologic Oncology

## 2020-10-12 ENCOUNTER — Telehealth: Payer: Self-pay | Admitting: *Deleted

## 2020-10-12 DIAGNOSIS — N183 Chronic kidney disease, stage 3 unspecified: Secondary | ICD-10-CM | POA: Diagnosis not present

## 2020-10-12 DIAGNOSIS — I130 Hypertensive heart and chronic kidney disease with heart failure and stage 1 through stage 4 chronic kidney disease, or unspecified chronic kidney disease: Secondary | ICD-10-CM | POA: Diagnosis not present

## 2020-10-12 DIAGNOSIS — I5032 Chronic diastolic (congestive) heart failure: Secondary | ICD-10-CM | POA: Diagnosis not present

## 2020-10-12 DIAGNOSIS — E1122 Type 2 diabetes mellitus with diabetic chronic kidney disease: Secondary | ICD-10-CM | POA: Diagnosis not present

## 2020-10-12 NOTE — Telephone Encounter (Signed)
Move appt from today to 10/10

## 2020-10-16 ENCOUNTER — Ambulatory Visit
Admission: RE | Admit: 2020-10-16 | Discharge: 2020-10-16 | Disposition: A | Discharge status: Home / Self Care | Payer: Medicare HMO | Source: Ambulatory Visit | Attending: Radiation Oncology | Admitting: Radiation Oncology

## 2020-10-16 ENCOUNTER — Other Ambulatory Visit: Payer: Self-pay

## 2020-10-16 DIAGNOSIS — C519 Malignant neoplasm of vulva, unspecified: Secondary | ICD-10-CM | POA: Insufficient documentation

## 2020-10-16 DIAGNOSIS — Z510 Encounter for antineoplastic radiation therapy: Secondary | ICD-10-CM | POA: Insufficient documentation

## 2020-10-17 DIAGNOSIS — G4733 Obstructive sleep apnea (adult) (pediatric): Secondary | ICD-10-CM | POA: Diagnosis not present

## 2020-10-19 DIAGNOSIS — G4733 Obstructive sleep apnea (adult) (pediatric): Secondary | ICD-10-CM | POA: Diagnosis not present

## 2020-10-22 ENCOUNTER — Inpatient Hospital Stay: Payer: Medicare HMO | Attending: Gynecologic Oncology | Admitting: Gynecologic Oncology

## 2020-10-22 ENCOUNTER — Other Ambulatory Visit: Payer: Self-pay

## 2020-10-22 ENCOUNTER — Encounter: Payer: Self-pay | Admitting: Gynecologic Oncology

## 2020-10-22 ENCOUNTER — Ambulatory Visit: Payer: Medicare HMO | Admitting: Radiation Oncology

## 2020-10-22 VITALS — BP 144/54 | HR 59 | Temp 98.7°F | Resp 16 | Ht 62.0 in | Wt 176.0 lb

## 2020-10-22 DIAGNOSIS — C519 Malignant neoplasm of vulva, unspecified: Secondary | ICD-10-CM | POA: Diagnosis not present

## 2020-10-22 DIAGNOSIS — Z9079 Acquired absence of other genital organ(s): Secondary | ICD-10-CM

## 2020-10-22 DIAGNOSIS — Z510 Encounter for antineoplastic radiation therapy: Secondary | ICD-10-CM | POA: Diagnosis not present

## 2020-10-22 NOTE — Patient Instructions (Signed)
It was good to see you today!  You have healed almost completely from surgery and I think you are ready to start radiation.  I will see you several months after you finish your radiation.

## 2020-10-22 NOTE — Progress Notes (Signed)
Gynecologic Oncology Return Clinic Visit  10/22/2020  Reason for Visit: Wound check  Treatment History: Oncology History  Vulvar cancer ([place])  07/20/2020 Initial Diagnosis   Vulvar cancer ([place])   07/26/2020 Surgery   EUA, vulvar biopsies  Findings: Pictures taken in media.  On exam, there is about a 4 x 3-1/2 cm area concerning for involvement by carcinoma that spans between the clitoris (replacing the bottom aspect of the clitoris) to just superior to the urethra.  This is a butterfly lesion that extends along the inner labia bilaterally.  Previous biopsy site is healing well with suture still intact.  There is a firmer and raised area that is approximately 1 cm around the area of the biopsy.  Biopsies taken circumferentially around the lesion noted as well as from the central aspect to help delineate what is cancer and what may be chronic dermatoses.  Given appearance today with patient more comfortable, I am suspicious that the entire lesion is carcinoma.   07/26/2020 Pathology Results   A. VULVA, 6 OCLOCK, INFERIOR ASPECT, BIOPSY:  - Superficial fragments of squamous cell carcinoma.   B. VULVA, 11 OCLOCK, BIOPSY:  - Invasive squamous cell carcinoma.  - No lymphovascular invasion.   C. VULVA, 2 OCLOCK, BIOPSY:  - Invasive squamous cell carcinoma.  - No lymphovascular invasion.   D. VULVA, CENTRAL, BIOPSY:  - Superficial fragment of squamous cell carcinoma.    08/06/2020 Imaging   PET: no evidence of metastatic disease   08/07/2020 Clinical Stage   Stage IB   08/22/2020 Surgery   Partial radical anterior vulvectomy with right inguinal SLN biopsy.  Findings: 4x4cm butterfly lesion on the anterior vulva, spanning from just superior to the clitoris to just superior to the urethral meatus. Lesion crossed the midline, more significant lesion on the right. LSG prior to surgery showed bilateral mapping. No hot or blue SLN identified on the left, mildly blue and hot node identified as the  SLN on the right. Decision made to abort left-sided LND given hypertension and difficulty ventilating. Additionally, given intra-op cardiac and respiratory status, decision made to proceed with a less radical resection (very close if not positive peri-urethral margin visibly.   08/22/2020 Pathology Results   Stage IB, lymph node assessment only on the right given comorbidities and cardiac issues during surgery  Tumor Focality:    Unifocal     Tumor Site:    Right vulva     Tumor Site:    Left vulva     Tumor Size:    Greatest Dimension (Centimeters): 4 cm     Histologic Type:    Squamous cell carcinoma, HPV-independent     Histologic Grade:    G3, poorly differentiated     Depth of Tumor Invasion:    6 mm     Tumor Border:    Infiltrating     Other Tissue / Organ Involvement:    Not applicable     Lymphovascular Invasion:    Not identified   MARGINS     Margin Status for Invasive Carcinoma:    All margins negative for invasive carcinoma       Closest Margin(s) to Invasive Carcinoma:    Peripheral: 12:00       Distance from Invasive Carcinoma to Closest Margin:    1 mm     Margin Status for HSIL (VIN2-3) or dVIN:    All margins negative for high-grade squamous intraepithelial lesion (HSIL) and / or differentiated vulvar intraepithelial neoplasia (dVIN)  REGIONAL LYMPH NODES     Regional Lymph Node Status:           :    All regional lymph nodes negative for tumor cells       Total Number of Lymph Nodes Examined:    1         Nodal Site(s) Examined:    Right inguinal       Number of Sentinel Nodes Examined:    1    09/06/2020 Genetic Testing   Negative genetic testing:  No pathogenic variants detected on the [facility] Multi-Cancer + RNA panel. A variant of uncertain significance (VUS) was detected in the POLD1 gene called c.427G>A (p.Gly143Ser). The report date is 09/06/2020.  The Multi-Cancer + RNA Panel offered by [facility] includes sequencing and/or deletion/duplication analysis of the  following 84 genes:  AIP*, ALK, APC*, ATM*, AXIN2*, BAP1*, BARD1*, BLM*, BMPR1A*, BRCA1*, BRCA2*, BRIP1*, CASR, CDC73*, CDH1*, CDK4, CDKN1B*, CDKN1C*, CDKN2A, CEBPA, CHEK2*, CTNNA1*, DICER1*, DIS3L2*, EGFR, EPCAM, FH*, FLCN*, GATA2*, GPC3, GREM1, HOXB13, HRAS, KIT, MAX*, MEN1*, MET, MITF, MLH1*, MSH2*, MSH3*, MSH6*, MUTYH*, NBN*, NF1*, NF2*, NTHL1*, PALB2*, PDGFRA, PHOX2B, PMS2*, POLD1*, POLE*, POT1*, PRKAR1A*, PTCH1*, PTEN*, RAD50*, RAD51C*, RAD51D*, RB1*, RECQL4, RET, RUNX1*, SDHA*, SDHAF2*, SDHB*, SDHC*, SDHD*, SMAD4*, SMARCA4*, SMARCB1*, SMARCE1*, STK11*, SUFU*, TERC, TERT, TMEM127*, Tp53*, TSC1*, TSC2*, VHL*, WRN*, and WT1.  RNA analysis is performed for * genes.      Interval History: Patient presents today for wound check prior to starting radiation therapy.  She notes overall doing much better.  She denies any vulvar pain, bleeding, or discharge.  She describes having normal bladder function.  Has daily bowel movements are sometimes more than once a day, occasionally loose.  Is now back on oxygen just at night which is her baseline.  Notes that breathing has improved, still short of breath sometimes with movement.  Has a pulmonary visit coming up soon.  Past Medical/Surgical History: Past Medical History:  Diagnosis Date   Arthritis    Diabetes mellitus without complication ([place])    Dyspnea    Encounter for care of pacemaker 09/03/2018   Family history of kidney cancer    Family history of throat cancer    Family history of uterine cancer    History of chickenpox    Hx of psoriatic arthritis    Hyperlipemia    Hypertension    Hypertension 05/03/2018   Hypothyroidism    Mitral valve disorders(424.0)    Obstructive sleep apnea    Pacemaker    Paroxysmal atrial fibrillation ([place]) 10/11/2016   Peripheral neuropathy    Renal disorder Kidney disease stage2   Thyroid disease hypothyroid   Urine incontinence    Vitamin D deficiency     Past Surgical History:  Procedure Laterality  Date   BACK SURGERY     BACK SURGERY     CARDIAC CATHETERIZATION N/A 04/29/2015   Procedure: Temporary Pacemaker;  Surgeon: Adrian Prows, MD;  Location: [facility];  Service: Cardiovascular;  Laterality: N/A;   CARDIOVERSION N/A 09/07/2018   Procedure: CARDIOVERSION;  Surgeon: Adrian Prows, MD;  Location: [place];  Service: Cardiovascular;  Laterality: N/A;   CARDIOVERSION     COLONOSCOPY     EP IMPLANTABLE DEVICE N/A 04/30/2015   Procedure: Pacemaker Implant;  Surgeon: Evans Lance, MD;  Location: [facility];  Service: Cardiovascular;  Laterality: N/A;   INTRAVASCULAR PRESSURE WIRE/FFR STUDY N/A 08/31/2017   Procedure: INTRAVASCULAR PRESSURE WIRE/FFR STUDY;  Surgeon: Nigel Mormon, MD;  Location: [facility];  Service: Cardiovascular;  Laterality: N/A;   LEFT AND RIGHT HEART CATHETERIZATION WITH CORONARY ANGIOGRAM N/A 09/30/2011   Procedure: LEFT AND RIGHT HEART CATHETERIZATION WITH CORONARY ANGIOGRAM;  Surgeon: Laverda Page, MD;  Location: [facility];  Service: Cardiovascular;  Laterality: N/A;   RIGHT/LEFT HEART CATH AND CORONARY ANGIOGRAPHY N/A 08/31/2017   Procedure: RIGHT/LEFT HEART CATH AND CORONARY ANGIOGRAPHY;  Surgeon: Nigel Mormon, MD;  Location: [place] [facility];  Service: Cardiovascular;  Laterality: N/A;   TOOTH EXTRACTION  10/07/2018   TUBAL LIGATION     VULVA Milagros Loll BIOPSY N/A 07/26/2020   Procedure: VULVAR BIOPSY;  Surgeon: Lafonda Mosses, MD;  Location: [facility];  Service: Gynecology;  Laterality: N/A;   YAG LASER APPLICATION Right 48/01/6551   Procedure: YAG LASER APPLICATION;  Surgeon: Elta Guadeloupe T. Gershon Crane, MD;  Location: [facility];  Service: Ophthalmology;  Laterality: Right;  pt knows to arrive at 74:82   YAG LASER APPLICATION Left 70/78/6754   Procedure: YAG LASER APPLICATION;  Surgeon: Rutherford Guys, MD;  Location: [facility];  Service: Ophthalmology;  Laterality: Left;    Family History  Problem Relation Age of Onset    Arthritis Mother    Diabetes Mother    Heart disease Mother    Hyperlipidemia Mother    Hypertension Mother    Arthritis Father    Asthma Father    Heart attack Father    Hyperlipidemia Father    Hypertension Father    Stroke Father    Arthritis Sister    Diabetes Sister    Hypertension Sister    Arthritis Sister    Diabetes Sister    Hyperlipidemia Sister    Hypertension Sister    Stroke Sister    Ovarian cancer Sister 60   Pancreatic cancer Sister 31   Uterine cancer Sister 14   Hyperlipidemia Sister    Hypertension Sister    Arthritis Brother    Heart attack Brother    Heart disease Brother    Hyperlipidemia Brother    Hypertension Brother    Alcohol abuse Brother    Arthritis Brother    Diabetes Brother    Early death Brother    Heart disease Brother    Hyperlipidemia Brother    Hypertension Brother    Cancer Maternal Aunt 70       unknown type   Throat cancer Maternal Grandfather        hx smoking/drinking   Hypertension Daughter    Cancer Daughter        "female cancer cells"   Kidney cancer Nephew        dx 45s   Colon cancer Neg Hx    Breast cancer Neg Hx    Prostate cancer Neg Hx     Social History   Socioeconomic History   Marital status: Widowed    Spouse name: Not on file   Number of children: 3   Years of education: Not on file   Highest education level: Not on file  Occupational History   Not on file  Tobacco Use   Smoking status: Never   Smokeless tobacco: Never  Vaping Use   Vaping Use: Never used  Substance and Sexual Activity   Alcohol use: No    Alcohol/week: 0.0 standard drinks   Drug use: No   Sexual activity: Not Currently    Birth control/protection: Post-menopausal  Other Topics Concern   Not on file  Social History Narrative  Husband recently passed away on May 23, 2020.   Social Determinants of Health   Financial Resource Strain: Not on file  Food Insecurity: Not on file  Transportation Needs: Not on file   Physical Activity: Not on file  Stress: Not on file  Social Connections: Not on file    Current Medications:  Current Outpatient Medications:    amiodarone (PACERONE) 200 MG tablet, Take 1/2 (one-half) tablet by mouth once daily (Patient taking differently: Take 100 mg by mouth daily.), Disp: 45 tablet, Rfl: 3   Blood Glucose Monitoring Suppl (ACCU-CHEK AVIVA PLUS) w/Device KIT, , Disp: , Rfl:    ELIQUIS 5 MG TABS tablet, Take 1 tablet by mouth twice daily, Disp: 180 tablet, Rfl: 0   empagliflozin (JARDIANCE) 25 MG TABS tablet, Take 1 tablet (25 mg total) by mouth daily before breakfast., Disp: 30 tablet, Rfl: 6   EUTHYROX 88 MCG tablet, Take 88 mcg by mouth daily., Disp: , Rfl:    fenofibrate 160 MG tablet, Take 160 mg by mouth daily with supper. , Disp: , Rfl:    insulin aspart protamine- aspart (NOVOLOG MIX 70/30) (70-30) 100 UNIT/ML injection, Inject 35-45 Units into the skin See admin instructions. Inject 45 units into the skin with breakfast, 35 units with supper (may adjust based on blood sugar readings), Disp: , Rfl:    isosorbide mononitrate (IMDUR) 60 MG 24 hr tablet, Take 1 tablet (60 mg total) by mouth daily., Disp: 90 tablet, Rfl: 3   metoprolol tartrate (LOPRESSOR) 50 MG tablet, Take 1 tablet (50 mg total) by mouth 2 (two) times daily., Disp: 180 tablet, Rfl: 3   nitroGLYCERIN (NITROSTAT) 0.4 MG SL tablet, Place 1 tablet (0.4 mg total) under the tongue every 5 (five) minutes as needed for chest pain., Disp: 25 tablet, Rfl: 1   olmesartan-hydrochlorothiazide (BENICAR HCT) 40-25 MG tablet, Take 1 tablet by mouth daily., Disp: 90 tablet, Rfl: 3   spironolactone (ALDACTONE) 25 MG tablet, Take 1 tablet by mouth once daily (Patient taking differently: Take 25 mg by mouth daily.), Disp: 90 tablet, Rfl: 0   SURE COMFORT INSULIN SYRINGE 31G X 5/16" 0.5 ML MISC, , Disp: , Rfl:    torsemide (DEMADEX) 10 MG tablet, Take 1 tablet (10 mg total) by mouth 2 (two) times daily., Disp: 30 tablet,  Rfl: 3   traMADol (ULTRAM) 50 MG tablet, Take 1 tablet (50 mg total) by mouth every 6 (six) hours as needed for severe pain. Do not take and drive, Disp: 30 tablet, Rfl: 0   amLODipine (NORVASC) 5 MG tablet, 1 tablet (Patient not taking: Reported on 10/22/2020), Disp: , Rfl:    lidocaine (XYLOCAINE) 5 % ointment, Apply 1 application topically 3 (three) times daily as needed. (Patient not taking: Reported on 10/22/2020), Disp: 35.44 g, Rfl: 2  Review of Systems: Denies appetite changes, fevers, chills, fatigue, unexplained weight changes. Denies hearing loss, neck lumps or masses, mouth sores, ringing in ears or voice changes. Denies cough or wheezing.   Denies chest pain or palpitations.  Denies abdominal distention, pain, blood in stools, constipation, diarrhea, nausea, vomiting, or early satiety. Denies pain with intercourse, dysuria, frequency, hematuria or incontinence. Denies hot flashes, pelvic pain, vaginal bleeding or vaginal discharge.   Denies joint pain, back pain or muscle pain/cramps. Denies itching, rash, or wounds. Denies dizziness, headaches, numbness or seizures. Denies swollen lymph nodes or glands, denies easy bruising or bleeding. Denies anxiety, depression, confusion, or decreased concentration.  Physical Exam: BP (!) 144/54 (BP Location:  Right Arm, Patient Position: Sitting)   Pulse (!) 59   Temp 98.7 F (37.1 C) (Oral)   Resp 16   Ht 5' 2"  (1.575 m)   Wt 176 lb (79.8 kg)   SpO2 97%   BMI 32.19 kg/m  General: Alert, oriented, no acute distress. HEENT: Normocephalic, atraumatic, sclera anicteric.  GU: Well-healing vulvar incision.  Some mild erythema of the vulva, not consistent with infection noted.  Patient's incision has completely healed with the exception of about a 3 cm area towards the anterior vagina that looks minimally desquamative.  No induration or exudate noted.  No pain with palpation or nodularity appreciated.  Laboratory & Radiologic  Studies: None new  Assessment & Plan: [name] is a 75 y.o. woman with at least Stage IB grade 3 SCC of the vulva, HPV - independent, who presents for wound check.  Patient has done very well from a postoperative standpoint and her wound is almost completely healed.  Based on my exam today, I think she can begin adjuvant radiation as scheduled to start tomorrow.  I will see her back several months after she finishes treatment.  22 minutes of total time was spent for this patient encounter, including preparation, face-to-face counseling with the patient and coordination of care, and documentation of the encounter.  Jeral Pinch, MD  Division of Gynecologic Oncology  Department of Obstetrics and Gynecology  [facility]

## 2020-10-23 ENCOUNTER — Ambulatory Visit
Admission: RE | Admit: 2020-10-23 | Discharge: 2020-10-23 | Disposition: A | Discharge status: Home / Self Care | Payer: Medicare HMO | Source: Ambulatory Visit | Attending: Radiation Oncology | Admitting: Radiation Oncology

## 2020-10-23 DIAGNOSIS — I129 Hypertensive chronic kidney disease with stage 1 through stage 4 chronic kidney disease, or unspecified chronic kidney disease: Secondary | ICD-10-CM | POA: Diagnosis not present

## 2020-10-23 DIAGNOSIS — C519 Malignant neoplasm of vulva, unspecified: Secondary | ICD-10-CM | POA: Diagnosis not present

## 2020-10-23 DIAGNOSIS — I4891 Unspecified atrial fibrillation: Secondary | ICD-10-CM | POA: Diagnosis not present

## 2020-10-23 DIAGNOSIS — E1122 Type 2 diabetes mellitus with diabetic chronic kidney disease: Secondary | ICD-10-CM | POA: Diagnosis not present

## 2020-10-23 DIAGNOSIS — Z510 Encounter for antineoplastic radiation therapy: Secondary | ICD-10-CM | POA: Diagnosis not present

## 2020-10-23 NOTE — Progress Notes (Signed)
Pt here for patient teaching.    Pt given Radiation and You booklet and skin care instructions.    Reviewed areas of pertinence such as diarrhea, fatigue, hair loss, nausea and vomiting, sexual and fertility changes, skin changes, and urinary and bladder changes .   Pt able to give teach back of to pat skin, use unscented/gentle soap, and drink plenty of water,avoid applying anything to skin within 4 hours of treatment.   Pt verbalizes understanding of information given and will contact nursing with any questions or concerns.    [email or web address]

## 2020-10-24 ENCOUNTER — Other Ambulatory Visit: Payer: Self-pay

## 2020-10-24 ENCOUNTER — Ambulatory Visit
Admission: RE | Admit: 2020-10-24 | Discharge: 2020-10-24 | Disposition: A | Discharge status: Home / Self Care | Payer: Medicare HMO | Source: Ambulatory Visit | Attending: Radiation Oncology | Admitting: Radiation Oncology

## 2020-10-24 DIAGNOSIS — C519 Malignant neoplasm of vulva, unspecified: Secondary | ICD-10-CM | POA: Diagnosis not present

## 2020-10-24 DIAGNOSIS — Z510 Encounter for antineoplastic radiation therapy: Secondary | ICD-10-CM | POA: Diagnosis not present

## 2020-10-25 ENCOUNTER — Ambulatory Visit
Admission: RE | Admit: 2020-10-25 | Discharge: 2020-10-25 | Disposition: A | Discharge status: Home / Self Care | Payer: Medicare HMO | Source: Ambulatory Visit | Attending: Radiation Oncology | Admitting: Radiation Oncology

## 2020-10-25 DIAGNOSIS — C519 Malignant neoplasm of vulva, unspecified: Secondary | ICD-10-CM | POA: Diagnosis not present

## 2020-10-25 DIAGNOSIS — Z510 Encounter for antineoplastic radiation therapy: Secondary | ICD-10-CM | POA: Diagnosis not present

## 2020-10-26 ENCOUNTER — Other Ambulatory Visit: Payer: Self-pay

## 2020-10-26 ENCOUNTER — Ambulatory Visit
Admission: RE | Admit: 2020-10-26 | Discharge: 2020-10-26 | Disposition: A | Discharge status: Home / Self Care | Payer: Medicare HMO | Source: Ambulatory Visit | Attending: Radiation Oncology | Admitting: Radiation Oncology

## 2020-10-26 DIAGNOSIS — C519 Malignant neoplasm of vulva, unspecified: Secondary | ICD-10-CM | POA: Diagnosis not present

## 2020-10-26 DIAGNOSIS — Z510 Encounter for antineoplastic radiation therapy: Secondary | ICD-10-CM | POA: Diagnosis not present

## 2020-10-29 ENCOUNTER — Other Ambulatory Visit: Payer: Self-pay

## 2020-10-29 ENCOUNTER — Ambulatory Visit
Admission: RE | Admit: 2020-10-29 | Discharge: 2020-10-29 | Disposition: A | Discharge status: Home / Self Care | Payer: Medicare HMO | Source: Ambulatory Visit | Attending: Radiation Oncology | Admitting: Radiation Oncology

## 2020-10-29 DIAGNOSIS — C519 Malignant neoplasm of vulva, unspecified: Secondary | ICD-10-CM | POA: Diagnosis not present

## 2020-10-29 DIAGNOSIS — Z510 Encounter for antineoplastic radiation therapy: Secondary | ICD-10-CM | POA: Diagnosis not present

## 2020-10-30 ENCOUNTER — Ambulatory Visit
Admission: RE | Admit: 2020-10-30 | Discharge: 2020-10-30 | Disposition: A | Discharge status: Home / Self Care | Payer: Medicare HMO | Source: Ambulatory Visit | Attending: Radiation Oncology | Admitting: Radiation Oncology

## 2020-10-30 DIAGNOSIS — Z510 Encounter for antineoplastic radiation therapy: Secondary | ICD-10-CM | POA: Diagnosis not present

## 2020-10-30 DIAGNOSIS — C519 Malignant neoplasm of vulva, unspecified: Secondary | ICD-10-CM | POA: Diagnosis not present

## 2020-10-31 ENCOUNTER — Other Ambulatory Visit: Payer: Self-pay

## 2020-10-31 ENCOUNTER — Ambulatory Visit
Admission: RE | Admit: 2020-10-31 | Discharge: 2020-10-31 | Disposition: A | Discharge status: Home / Self Care | Payer: Medicare HMO | Source: Ambulatory Visit | Attending: Radiation Oncology | Admitting: Radiation Oncology

## 2020-10-31 DIAGNOSIS — G609 Hereditary and idiopathic neuropathy, unspecified: Secondary | ICD-10-CM | POA: Diagnosis not present

## 2020-10-31 DIAGNOSIS — N183 Chronic kidney disease, stage 3 unspecified: Secondary | ICD-10-CM | POA: Diagnosis not present

## 2020-10-31 DIAGNOSIS — Z510 Encounter for antineoplastic radiation therapy: Secondary | ICD-10-CM | POA: Diagnosis not present

## 2020-10-31 DIAGNOSIS — C519 Malignant neoplasm of vulva, unspecified: Secondary | ICD-10-CM | POA: Diagnosis not present

## 2020-10-31 DIAGNOSIS — E785 Hyperlipidemia, unspecified: Secondary | ICD-10-CM | POA: Diagnosis not present

## 2020-10-31 DIAGNOSIS — I129 Hypertensive chronic kidney disease with stage 1 through stage 4 chronic kidney disease, or unspecified chronic kidney disease: Secondary | ICD-10-CM | POA: Diagnosis not present

## 2020-10-31 DIAGNOSIS — E1122 Type 2 diabetes mellitus with diabetic chronic kidney disease: Secondary | ICD-10-CM | POA: Diagnosis not present

## 2020-10-31 DIAGNOSIS — E559 Vitamin D deficiency, unspecified: Secondary | ICD-10-CM | POA: Diagnosis not present

## 2020-11-01 ENCOUNTER — Ambulatory Visit
Admission: RE | Admit: 2020-11-01 | Discharge: 2020-11-01 | Disposition: A | Discharge status: Home / Self Care | Payer: Medicare HMO | Source: Ambulatory Visit | Attending: Radiation Oncology | Admitting: Radiation Oncology

## 2020-11-01 DIAGNOSIS — R069 Unspecified abnormalities of breathing: Secondary | ICD-10-CM | POA: Diagnosis not present

## 2020-11-01 DIAGNOSIS — I5033 Acute on chronic diastolic (congestive) heart failure: Secondary | ICD-10-CM | POA: Diagnosis not present

## 2020-11-01 DIAGNOSIS — C519 Malignant neoplasm of vulva, unspecified: Secondary | ICD-10-CM | POA: Diagnosis not present

## 2020-11-01 DIAGNOSIS — Z510 Encounter for antineoplastic radiation therapy: Secondary | ICD-10-CM | POA: Diagnosis not present

## 2020-11-02 ENCOUNTER — Ambulatory Visit
Admission: RE | Admit: 2020-11-02 | Discharge: 2020-11-02 | Disposition: A | Discharge status: Home / Self Care | Payer: Medicare HMO | Source: Ambulatory Visit | Attending: Radiation Oncology | Admitting: Radiation Oncology

## 2020-11-02 ENCOUNTER — Other Ambulatory Visit: Payer: Self-pay

## 2020-11-02 DIAGNOSIS — Z510 Encounter for antineoplastic radiation therapy: Secondary | ICD-10-CM | POA: Diagnosis not present

## 2020-11-02 DIAGNOSIS — C519 Malignant neoplasm of vulva, unspecified: Secondary | ICD-10-CM | POA: Diagnosis not present

## 2020-11-05 ENCOUNTER — Other Ambulatory Visit: Payer: Self-pay

## 2020-11-05 ENCOUNTER — Ambulatory Visit
Admission: RE | Admit: 2020-11-05 | Discharge: 2020-11-05 | Disposition: A | Discharge status: Home / Self Care | Payer: Medicare HMO | Source: Ambulatory Visit | Attending: Radiation Oncology | Admitting: Radiation Oncology

## 2020-11-05 DIAGNOSIS — Z510 Encounter for antineoplastic radiation therapy: Secondary | ICD-10-CM | POA: Diagnosis not present

## 2020-11-05 DIAGNOSIS — C519 Malignant neoplasm of vulva, unspecified: Secondary | ICD-10-CM | POA: Diagnosis not present

## 2020-11-06 ENCOUNTER — Ambulatory Visit
Admission: RE | Admit: 2020-11-06 | Discharge: 2020-11-06 | Disposition: A | Discharge status: Home / Self Care | Payer: Medicare HMO | Source: Ambulatory Visit | Attending: Radiation Oncology | Admitting: Radiation Oncology

## 2020-11-06 ENCOUNTER — Other Ambulatory Visit: Payer: Self-pay

## 2020-11-06 DIAGNOSIS — C519 Malignant neoplasm of vulva, unspecified: Secondary | ICD-10-CM | POA: Diagnosis not present

## 2020-11-06 DIAGNOSIS — Z510 Encounter for antineoplastic radiation therapy: Secondary | ICD-10-CM | POA: Diagnosis not present

## 2020-11-07 ENCOUNTER — Ambulatory Visit
Admission: RE | Admit: 2020-11-07 | Discharge: 2020-11-07 | Disposition: A | Discharge status: Home / Self Care | Payer: Medicare HMO | Source: Ambulatory Visit | Attending: Radiation Oncology | Admitting: Radiation Oncology

## 2020-11-07 DIAGNOSIS — Z510 Encounter for antineoplastic radiation therapy: Secondary | ICD-10-CM | POA: Diagnosis not present

## 2020-11-07 DIAGNOSIS — C519 Malignant neoplasm of vulva, unspecified: Secondary | ICD-10-CM | POA: Diagnosis not present

## 2020-11-08 ENCOUNTER — Other Ambulatory Visit: Payer: Self-pay

## 2020-11-08 ENCOUNTER — Ambulatory Visit
Admission: RE | Admit: 2020-11-08 | Discharge: 2020-11-08 | Disposition: A | Discharge status: Home / Self Care | Payer: Medicare HMO | Source: Ambulatory Visit | Attending: Radiation Oncology | Admitting: Radiation Oncology

## 2020-11-08 DIAGNOSIS — Z510 Encounter for antineoplastic radiation therapy: Secondary | ICD-10-CM | POA: Diagnosis not present

## 2020-11-08 DIAGNOSIS — C519 Malignant neoplasm of vulva, unspecified: Secondary | ICD-10-CM | POA: Diagnosis not present

## 2020-11-09 ENCOUNTER — Ambulatory Visit
Admission: RE | Admit: 2020-11-09 | Discharge: 2020-11-09 | Disposition: A | Discharge status: Home / Self Care | Payer: Medicare HMO | Source: Ambulatory Visit | Attending: Radiation Oncology | Admitting: Radiation Oncology

## 2020-11-09 DIAGNOSIS — Z510 Encounter for antineoplastic radiation therapy: Secondary | ICD-10-CM | POA: Diagnosis not present

## 2020-11-09 DIAGNOSIS — C519 Malignant neoplasm of vulva, unspecified: Secondary | ICD-10-CM | POA: Diagnosis not present

## 2020-11-12 ENCOUNTER — Ambulatory Visit
Admission: RE | Admit: 2020-11-12 | Discharge: 2020-11-12 | Disposition: A | Discharge status: Home / Self Care | Payer: Medicare HMO | Source: Ambulatory Visit | Attending: Radiation Oncology | Admitting: Radiation Oncology

## 2020-11-12 ENCOUNTER — Other Ambulatory Visit: Payer: Self-pay

## 2020-11-12 DIAGNOSIS — C519 Malignant neoplasm of vulva, unspecified: Secondary | ICD-10-CM | POA: Diagnosis not present

## 2020-11-12 DIAGNOSIS — E1122 Type 2 diabetes mellitus with diabetic chronic kidney disease: Secondary | ICD-10-CM | POA: Diagnosis not present

## 2020-11-12 DIAGNOSIS — I5032 Chronic diastolic (congestive) heart failure: Secondary | ICD-10-CM | POA: Diagnosis not present

## 2020-11-12 DIAGNOSIS — I130 Hypertensive heart and chronic kidney disease with heart failure and stage 1 through stage 4 chronic kidney disease, or unspecified chronic kidney disease: Secondary | ICD-10-CM | POA: Diagnosis not present

## 2020-11-12 DIAGNOSIS — Z510 Encounter for antineoplastic radiation therapy: Secondary | ICD-10-CM | POA: Diagnosis not present

## 2020-11-12 DIAGNOSIS — N183 Chronic kidney disease, stage 3 unspecified: Secondary | ICD-10-CM | POA: Diagnosis not present

## 2020-11-13 ENCOUNTER — Ambulatory Visit
Admission: RE | Admit: 2020-11-13 | Discharge: 2020-11-13 | Disposition: A | Discharge status: Home / Self Care | Payer: Medicare HMO | Source: Ambulatory Visit | Attending: Radiation Oncology | Admitting: Radiation Oncology

## 2020-11-13 DIAGNOSIS — C519 Malignant neoplasm of vulva, unspecified: Secondary | ICD-10-CM | POA: Diagnosis not present

## 2020-11-13 DIAGNOSIS — Z510 Encounter for antineoplastic radiation therapy: Secondary | ICD-10-CM | POA: Insufficient documentation

## 2020-11-14 ENCOUNTER — Ambulatory Visit
Admission: RE | Admit: 2020-11-14 | Discharge: 2020-11-14 | Disposition: A | Discharge status: Home / Self Care | Payer: Medicare HMO | Source: Ambulatory Visit | Attending: Radiation Oncology | Admitting: Radiation Oncology

## 2020-11-14 ENCOUNTER — Other Ambulatory Visit: Payer: Self-pay

## 2020-11-14 DIAGNOSIS — C519 Malignant neoplasm of vulva, unspecified: Secondary | ICD-10-CM | POA: Diagnosis not present

## 2020-11-14 DIAGNOSIS — Z510 Encounter for antineoplastic radiation therapy: Secondary | ICD-10-CM | POA: Diagnosis not present

## 2020-11-15 ENCOUNTER — Ambulatory Visit
Admission: RE | Admit: 2020-11-15 | Discharge: 2020-11-15 | Disposition: A | Discharge status: Home / Self Care | Payer: Medicare HMO | Source: Ambulatory Visit | Attending: Radiation Oncology | Admitting: Radiation Oncology

## 2020-11-15 DIAGNOSIS — Z510 Encounter for antineoplastic radiation therapy: Secondary | ICD-10-CM | POA: Diagnosis not present

## 2020-11-15 DIAGNOSIS — C519 Malignant neoplasm of vulva, unspecified: Secondary | ICD-10-CM | POA: Diagnosis not present

## 2020-11-16 ENCOUNTER — Other Ambulatory Visit: Payer: Self-pay

## 2020-11-16 ENCOUNTER — Ambulatory Visit
Admission: RE | Admit: 2020-11-16 | Discharge: 2020-11-16 | Disposition: A | Discharge status: Home / Self Care | Payer: Medicare HMO | Source: Ambulatory Visit | Attending: Radiation Oncology | Admitting: Radiation Oncology

## 2020-11-16 DIAGNOSIS — C519 Malignant neoplasm of vulva, unspecified: Secondary | ICD-10-CM | POA: Diagnosis not present

## 2020-11-16 DIAGNOSIS — Z510 Encounter for antineoplastic radiation therapy: Secondary | ICD-10-CM | POA: Diagnosis not present

## 2020-11-19 ENCOUNTER — Other Ambulatory Visit: Payer: Self-pay

## 2020-11-19 ENCOUNTER — Ambulatory Visit: Payer: Medicare HMO

## 2020-11-19 ENCOUNTER — Ambulatory Visit
Admission: RE | Admit: 2020-11-19 | Discharge: 2020-11-19 | Disposition: A | Discharge status: Home / Self Care | Payer: Medicare HMO | Source: Ambulatory Visit | Attending: Radiation Oncology | Admitting: Radiation Oncology

## 2020-11-19 DIAGNOSIS — I5032 Chronic diastolic (congestive) heart failure: Secondary | ICD-10-CM

## 2020-11-19 DIAGNOSIS — C519 Malignant neoplasm of vulva, unspecified: Secondary | ICD-10-CM | POA: Diagnosis not present

## 2020-11-19 DIAGNOSIS — Z510 Encounter for antineoplastic radiation therapy: Secondary | ICD-10-CM | POA: Diagnosis not present

## 2020-11-19 DIAGNOSIS — G4733 Obstructive sleep apnea (adult) (pediatric): Secondary | ICD-10-CM | POA: Diagnosis not present

## 2020-11-20 ENCOUNTER — Other Ambulatory Visit: Payer: Self-pay | Admitting: Radiation Oncology

## 2020-11-20 ENCOUNTER — Ambulatory Visit
Admission: RE | Admit: 2020-11-20 | Discharge: 2020-11-20 | Disposition: A | Discharge status: Home / Self Care | Payer: Medicare HMO | Source: Ambulatory Visit | Attending: Radiation Oncology | Admitting: Radiation Oncology

## 2020-11-20 DIAGNOSIS — C519 Malignant neoplasm of vulva, unspecified: Secondary | ICD-10-CM

## 2020-11-20 DIAGNOSIS — Z510 Encounter for antineoplastic radiation therapy: Secondary | ICD-10-CM | POA: Diagnosis not present

## 2020-11-20 MED ORDER — LIDOCAINE 5 % EX OINT: 1.0000 "application " | TOPICAL_OINTMENT | Freq: Three times a day (TID) | CUTANEOUS | 2 refills | Status: DC | PRN

## 2020-11-20 MED ORDER — SILVER SULFADIAZINE 1 % EX CREA: TOPICAL_CREAM | Freq: Every day | CUTANEOUS | Status: DC

## 2020-11-20 NOTE — Progress Notes (Signed)
Called pt to inform her about her echo results pt understood

## 2020-11-20 NOTE — Progress Notes (Signed)
Left atrial dilation improved compared to 2020, otherwise echocardiogram stable. Will discuss further at upcoming [place].

## 2020-11-21 ENCOUNTER — Ambulatory Visit
Admission: RE | Admit: 2020-11-21 | Discharge: 2020-11-21 | Disposition: A | Discharge status: Home / Self Care | Payer: Medicare HMO | Source: Ambulatory Visit | Attending: Radiation Oncology | Admitting: Radiation Oncology

## 2020-11-21 ENCOUNTER — Other Ambulatory Visit: Payer: Self-pay

## 2020-11-21 DIAGNOSIS — C519 Malignant neoplasm of vulva, unspecified: Secondary | ICD-10-CM | POA: Diagnosis not present

## 2020-11-21 DIAGNOSIS — Z510 Encounter for antineoplastic radiation therapy: Secondary | ICD-10-CM | POA: Diagnosis not present

## 2020-11-22 ENCOUNTER — Ambulatory Visit: Payer: Medicare HMO | Admitting: Cardiology

## 2020-11-22 ENCOUNTER — Ambulatory Visit
Admission: RE | Admit: 2020-11-22 | Discharge: 2020-11-22 | Disposition: A | Discharge status: Home / Self Care | Payer: Medicare HMO | Source: Ambulatory Visit | Attending: Radiation Oncology | Admitting: Radiation Oncology

## 2020-11-22 DIAGNOSIS — Z510 Encounter for antineoplastic radiation therapy: Secondary | ICD-10-CM | POA: Diagnosis not present

## 2020-11-22 DIAGNOSIS — C519 Malignant neoplasm of vulva, unspecified: Secondary | ICD-10-CM | POA: Diagnosis not present

## 2020-11-23 ENCOUNTER — Other Ambulatory Visit: Payer: Self-pay

## 2020-11-23 ENCOUNTER — Telehealth: Payer: Self-pay

## 2020-11-23 ENCOUNTER — Ambulatory Visit
Admission: RE | Admit: 2020-11-23 | Discharge: 2020-11-23 | Disposition: A | Discharge status: Home / Self Care | Payer: Medicare HMO | Source: Ambulatory Visit | Attending: Radiation Oncology | Admitting: Radiation Oncology

## 2020-11-23 DIAGNOSIS — C519 Malignant neoplasm of vulva, unspecified: Secondary | ICD-10-CM | POA: Diagnosis not present

## 2020-11-23 DIAGNOSIS — Z510 Encounter for antineoplastic radiation therapy: Secondary | ICD-10-CM | POA: Diagnosis not present

## 2020-11-23 NOTE — Telephone Encounter (Signed)
Patient notified of Prior Authorization approval of Lidocaine 5% Ointment. Medication is approved through 02/18/2021

## 2020-11-26 ENCOUNTER — Ambulatory Visit
Admission: RE | Admit: 2020-11-26 | Discharge: 2020-11-26 | Disposition: A | Discharge status: Home / Self Care | Payer: Medicare HMO | Source: Ambulatory Visit | Attending: Radiation Oncology | Admitting: Radiation Oncology

## 2020-11-26 ENCOUNTER — Other Ambulatory Visit: Payer: Self-pay

## 2020-11-26 DIAGNOSIS — Z510 Encounter for antineoplastic radiation therapy: Secondary | ICD-10-CM | POA: Diagnosis not present

## 2020-11-26 DIAGNOSIS — C519 Malignant neoplasm of vulva, unspecified: Secondary | ICD-10-CM | POA: Diagnosis not present

## 2020-11-27 ENCOUNTER — Ambulatory Visit
Admission: RE | Admit: 2020-11-27 | Discharge: 2020-11-27 | Disposition: A | Discharge status: Home / Self Care | Payer: Medicare HMO | Source: Ambulatory Visit | Attending: Radiation Oncology | Admitting: Radiation Oncology

## 2020-11-27 ENCOUNTER — Other Ambulatory Visit: Payer: Self-pay | Admitting: Radiation Oncology

## 2020-11-27 DIAGNOSIS — Z510 Encounter for antineoplastic radiation therapy: Secondary | ICD-10-CM | POA: Diagnosis not present

## 2020-11-27 DIAGNOSIS — C519 Malignant neoplasm of vulva, unspecified: Secondary | ICD-10-CM | POA: Diagnosis not present

## 2020-11-27 MED ORDER — HYDROCORTISONE ACETATE 25 MG RE SUPP: 25.0000 mg | Freq: Two times a day (BID) | RECTAL | 0 refills | Status: DC

## 2020-11-28 ENCOUNTER — Ambulatory Visit
Admission: RE | Admit: 2020-11-28 | Discharge: 2020-11-28 | Disposition: A | Discharge status: Home / Self Care | Payer: Medicare HMO | Source: Ambulatory Visit | Attending: Radiation Oncology | Admitting: Radiation Oncology

## 2020-11-28 ENCOUNTER — Other Ambulatory Visit: Payer: Self-pay

## 2020-11-28 ENCOUNTER — Other Ambulatory Visit: Payer: Self-pay | Admitting: Cardiology

## 2020-11-28 DIAGNOSIS — Z510 Encounter for antineoplastic radiation therapy: Secondary | ICD-10-CM | POA: Diagnosis not present

## 2020-11-28 DIAGNOSIS — C519 Malignant neoplasm of vulva, unspecified: Secondary | ICD-10-CM | POA: Diagnosis not present

## 2020-11-29 ENCOUNTER — Other Ambulatory Visit: Payer: Self-pay

## 2020-11-29 ENCOUNTER — Ambulatory Visit
Admission: RE | Admit: 2020-11-29 | Discharge: 2020-11-29 | Disposition: A | Discharge status: Home / Self Care | Payer: Medicare HMO | Source: Ambulatory Visit | Attending: Radiation Oncology | Admitting: Radiation Oncology

## 2020-11-29 ENCOUNTER — Ambulatory Visit: Payer: Medicare HMO | Admitting: Radiation Oncology

## 2020-11-29 DIAGNOSIS — Z510 Encounter for antineoplastic radiation therapy: Secondary | ICD-10-CM | POA: Diagnosis not present

## 2020-11-29 DIAGNOSIS — C519 Malignant neoplasm of vulva, unspecified: Secondary | ICD-10-CM | POA: Diagnosis not present

## 2020-11-30 ENCOUNTER — Ambulatory Visit: Payer: Medicare HMO | Admitting: Radiation Oncology

## 2020-11-30 ENCOUNTER — Ambulatory Visit: Payer: Medicare HMO

## 2020-12-02 ENCOUNTER — Ambulatory Visit: Payer: Medicare HMO

## 2020-12-02 DIAGNOSIS — I5033 Acute on chronic diastolic (congestive) heart failure: Secondary | ICD-10-CM | POA: Diagnosis not present

## 2020-12-02 DIAGNOSIS — R069 Unspecified abnormalities of breathing: Secondary | ICD-10-CM | POA: Diagnosis not present

## 2020-12-03 ENCOUNTER — Other Ambulatory Visit: Payer: Self-pay

## 2020-12-03 ENCOUNTER — Ambulatory Visit: Payer: Medicare HMO

## 2020-12-03 ENCOUNTER — Ambulatory Visit: Payer: Medicare HMO | Admitting: Internal Medicine

## 2020-12-03 ENCOUNTER — Encounter: Payer: Self-pay | Admitting: Internal Medicine

## 2020-12-03 DIAGNOSIS — R0609 Other forms of dyspnea: Secondary | ICD-10-CM | POA: Diagnosis not present

## 2020-12-03 NOTE — Progress Notes (Signed)
[name], female    DOB: 01/15/1945   MRN: [id number]   Brief patient profile:  52 yowf never smoker no trouble as child or as  adult including  pregnancies but around  Mid 67s developed  year round sense of nasal congestion regardless of location/ climate then around her late  40s doe and gradually worse esp since summer 2020 so referred to pulmonary clinic in [facility]  08/26/2019 by  Binnie Kand NP with Dr [facility] practice      History of Present Illness  08/26/2019  Pulmonary/ 1st office eval/Rowe Warman  Chief Complaint  Patient presents with   Pulmonary Consult    Referred by Binnie Kand, NP. Pt c/o DOE for 10 years- worse over the past 1 year. She is "out of breath" walking from her kitchen to her bathroom.    Dyspnea:  50 ft  Cough: no Sleep: bipap and 02 4lpm / bed is flat/ 2 pillows x  5 years (started with cpap)  SABA use: none On amiodarone x 04/2015 first dose Rec Please schedule a follow up visit in 3 months but call sooner if needed  - PFT's on return same day > not done   Started RT for vulvar RT  10/23/20    12/03/2020  f/u ov/[place] office/Calli Bashor re: doe maint on amiodarone 138m per day/ miserable due to RT of vulvar ca  Chief Complaint  Patient presents with   Follow-up    Feels that breathing is about the same since last OV.   Dyspnea:  riding scooter at grocery store/ still going 100 ft to MB minimal grade can do s stopping  Cough: no cough Sleeping: sleeping ok on bipap /4lpm per KClaiborne Billingsx 3 y no recent change bed is flat  SABA use: none  02: only on bipap  Covid status: vax x 2      No obvious day to day or daytime variability or assoc excess/ purulent sputum or mucus plugs or hemoptysis or cp or chest tightness, subjective wheeze or overt sinus or hb symptoms.   Sleeping as above without nocturnal  or early am exacerbation  of respiratory  c/o's or need for noct saba. Also denies any obvious fluctuation of symptoms with weather or environmental  changes or other aggravating or alleviating factors except as outlined above   No unusual exposure hx or h/o childhood pna/ asthma or knowledge of premature birth.  Current Allergies, Complete Past Medical History, Past Surgical History, Family History, and Social History were reviewed in [employer].  ROS  The following are not active complaints unless bolded Hoarseness, sore throat, dysphagia, dental problems, itching, sneezing,  nasal congestion or discharge of excess mucus or purulent secretions, ear ache,   fever, chills, sweats, unintended wt loss or wt gain, classically pleuritic or exertional cp,  orthopnea pnd or arm/hand swelling  or leg swelling, presyncope, palpitations, abdominal pain, anorexia, nausea, vomiting, diarrhea  or change in bowel habits or change in bladder habits, change in stools or change in urine, dysuria, hematuria,  rash, arthralgias, visual complaints, headache, numbness, weakness or ataxia or problems with walking or coordination,  change in mood or  memory.        Current Meds  Medication Sig   amiodarone (PACERONE) 200 MG tablet Take 1/2 (one-half) tablet by mouth once daily (Patient taking differently: Take 100 mg by mouth daily.)   amLODipine (NORVASC) 5 MG tablet    Blood Glucose Monitoring Suppl (ACCU-CHEK AVIVA PLUS)  w/Device KIT    ELIQUIS 5 MG TABS tablet Take 1 tablet by mouth twice daily   empagliflozin (JARDIANCE) 25 MG TABS tablet Take 1 tablet (25 mg total) by mouth daily before breakfast.   EUTHYROX 88 MCG tablet Take 88 mcg by mouth daily.   fenofibrate 160 MG tablet Take 160 mg by mouth daily with supper.    hydrocortisone (ANUSOL-HC) 25 MG suppository Place 1 suppository (25 mg total) rectally 2 (two) times daily.   insulin aspart protamine- aspart (NOVOLOG MIX 70/30) (70-30) 100 UNIT/ML injection Inject 35-45 Units into the skin See admin instructions. Inject 45 units into the skin with breakfast, 35 units with  supper (may adjust based on blood sugar readings)   isosorbide mononitrate (IMDUR) 60 MG 24 hr tablet Take 1 tablet (60 mg total) by mouth daily.   lidocaine (XYLOCAINE) 5 % ointment Apply 1 application topically 3 (three) times daily as needed.   metoprolol tartrate (LOPRESSOR) 50 MG tablet Take 1 tablet (50 mg total) by mouth 2 (two) times daily.   nitroGLYCERIN (NITROSTAT) 0.4 MG SL tablet Place 1 tablet (0.4 mg total) under the tongue every 5 (five) minutes as needed for chest pain.   olmesartan-hydrochlorothiazide (BENICAR HCT) 40-25 MG tablet Take 1 tablet by mouth daily.   spironolactone (ALDACTONE) 25 MG tablet Take 1 tablet by mouth once daily   SURE COMFORT INSULIN SYRINGE 31G X 5/16" 0.5 ML MISC    torsemide (DEMADEX) 10 MG tablet Take 1 tablet (10 mg total) by mouth 2 (two) times daily.   traMADol (ULTRAM) 50 MG tablet Take 1 tablet (50 mg total) by mouth every 6 (six) hours as needed for severe pain. Do not take and drive            Past Medical History:  Diagnosis Date   Arthritis    Diabetes mellitus without complication ([place])    Encounter for care of pacemaker 09/03/2018   History of chickenpox    Hx of psoriatic arthritis    Hyperlipemia    Hypertension    Hypertension 05/03/2018   Mitral valve disorders(424.0)    Obstructive sleep apnea    Pacemaker    Paroxysmal atrial fibrillation ([facility]) 10/11/2016   Peripheral neuropathy    Renal disorder Kidney disease stage2   Thyroid disease hypothyroid   Urine incontinence    Vitamin D deficiency       Objective:       12/03/2020     173  08/26/19 200 lb (90.7 kg)  05/12/19 202 lb (91.6 kg)  01/11/19 202 lb 8 oz (91.9 kg)   10/01/15          196    Vital signs reviewed  12/03/2020  - Note at rest 02 sats  96% on RA   General appearance:    chronically ill obese wf nad   HEENT : pt wearing mask not removed for exam due to covid -19 concerns.    NECK :  without JVD/Nodes/TM/ nl carotid upstrokes  bilaterally   LUNGS: no acc muscle use,  Nl contour chest which is clear to A and P bilaterally without cough on insp or exp maneuvers   CV:  RRR  no s3   2-3/6 HSM s increase in P2, and no edema   ABD:  soft and nontender with nl inspiratory excursion in the supine position. No bruits or organomegaly appreciated, bowel sounds nl  MS:  Nl gait/ ext warm without deformities, calf tenderness, cyanosis or clubbing  No obvious joint restrictions   SKIN: warm and dry without lesions    NEURO:  alert, approp, nl sensorium with  no motor or cerebellar deficits apparent.                    Assessment

## 2020-12-03 NOTE — Patient Instructions (Signed)
Make sure you check your oxygen saturation at your highest level of activity to be sure it stays over 90% and keep track of it at least once a week, more often if breathing getting worse, and let me know if losing ground.   We will walk you today for a baseline at your pace   Please schedule a follow up visit in 6  months but call sooner if needed with pfts same day

## 2020-12-03 NOTE — Assessment & Plan Note (Signed)
Onset in her late 76s assoc with wt gain and PAF on amiodarone since 2017 (around age 75)  - CxR c/w severe eventration Ant portion of R HD since 05/25/2003  -  Echo  11/03/2018:  Left ventricle cavity is normal in size. Moderate concentric hypertrophy  of the left ventricle. Normal LV systolic function with EF 56%. Normal  global wall motion. Left atrial cavity is severely dilated.  Trace aortic regurgitation.  Moderate (Grade III) mitral regurgitation. Mild mitral valve leaflet  thickening.  Moderate tricuspid regurgitation. Estimated pulmonary artery systolic  pressure is 40 mmHg.  No significant change compared to previous study on 07/01/2017. -  08/26/2019   Walked RA  approx  200 ft  @ slow  pace  stopped due to  Sob/ sats 96% - Allergy profile 08/26/19  >  Eos 0.1 /  IgE   10 - PFT's rec 08/26/2019 >>>   Not done as of 12/03/2020  - 12/03/2020 walked RA x 3 laps each 150 ft patient walked at a slow pace. Reported SOB on the third lap with lowest sats 92%   Concerned with multipfactorial doe in pt on amiodarone but now undergoing RT to vulva area and not a good time to try PFTs for her  - she did agree to monitor sats on RA walking and call if trending down o/w regroup in 6 m with pfts   Each maintenance medication was reviewed in detail including emphasizing most importantly the difference between maintenance and prns and under what circumstances the prns are to be triggered using an action plan format where appropriate.  Total time for H and P, chart review, counseling,  directly observing portions of ambulatory 02 saturation study/ and generating customized AVS unique to this office visit / same day charting = 28 min

## 2020-12-04 ENCOUNTER — Ambulatory Visit: Payer: Medicare HMO

## 2020-12-05 ENCOUNTER — Ambulatory Visit: Payer: Medicare HMO

## 2020-12-05 ENCOUNTER — Other Ambulatory Visit: Payer: Self-pay | Admitting: Cardiology

## 2020-12-06 ENCOUNTER — Ambulatory Visit: Payer: Medicare HMO

## 2020-12-10 ENCOUNTER — Ambulatory Visit: Payer: Medicare HMO

## 2020-12-10 ENCOUNTER — Other Ambulatory Visit: Payer: Self-pay

## 2020-12-10 ENCOUNTER — Ambulatory Visit
Admission: RE | Admit: 2020-12-10 | Discharge: 2020-12-10 | Disposition: A | Discharge status: Home / Self Care | Payer: Medicare HMO | Source: Ambulatory Visit | Attending: Radiation Oncology | Admitting: Radiation Oncology

## 2020-12-10 DIAGNOSIS — C519 Malignant neoplasm of vulva, unspecified: Secondary | ICD-10-CM | POA: Diagnosis not present

## 2020-12-10 DIAGNOSIS — Z510 Encounter for antineoplastic radiation therapy: Secondary | ICD-10-CM | POA: Diagnosis not present

## 2020-12-11 ENCOUNTER — Ambulatory Visit
Admission: RE | Admit: 2020-12-11 | Discharge: 2020-12-11 | Disposition: A | Discharge status: Home / Self Care | Payer: Medicare HMO | Source: Ambulatory Visit | Attending: Radiation Oncology | Admitting: Radiation Oncology

## 2020-12-11 DIAGNOSIS — C519 Malignant neoplasm of vulva, unspecified: Secondary | ICD-10-CM | POA: Diagnosis not present

## 2020-12-11 DIAGNOSIS — Z510 Encounter for antineoplastic radiation therapy: Secondary | ICD-10-CM | POA: Diagnosis not present

## 2020-12-12 ENCOUNTER — Ambulatory Visit
Admission: RE | Admit: 2020-12-12 | Discharge: 2020-12-12 | Disposition: A | Discharge status: Home / Self Care | Payer: Medicare HMO | Source: Ambulatory Visit | Attending: Radiation Oncology | Admitting: Radiation Oncology

## 2020-12-12 ENCOUNTER — Other Ambulatory Visit: Payer: Self-pay

## 2020-12-12 DIAGNOSIS — E782 Mixed hyperlipidemia: Secondary | ICD-10-CM | POA: Diagnosis not present

## 2020-12-12 DIAGNOSIS — I2510 Atherosclerotic heart disease of native coronary artery without angina pectoris: Secondary | ICD-10-CM | POA: Diagnosis not present

## 2020-12-12 DIAGNOSIS — Z510 Encounter for antineoplastic radiation therapy: Secondary | ICD-10-CM | POA: Diagnosis not present

## 2020-12-12 DIAGNOSIS — C519 Malignant neoplasm of vulva, unspecified: Secondary | ICD-10-CM | POA: Diagnosis not present

## 2020-12-12 DIAGNOSIS — I10 Essential (primary) hypertension: Secondary | ICD-10-CM | POA: Diagnosis not present

## 2020-12-12 DIAGNOSIS — I700 Atherosclerosis of aorta: Secondary | ICD-10-CM | POA: Diagnosis not present

## 2020-12-13 ENCOUNTER — Encounter: Payer: Medicare HMO | Admitting: Cardiology

## 2020-12-13 ENCOUNTER — Ambulatory Visit
Admission: RE | Admit: 2020-12-13 | Discharge: 2020-12-13 | Disposition: A | Discharge status: Home / Self Care | Payer: Medicare HMO | Source: Ambulatory Visit | Attending: Radiation Oncology | Admitting: Radiation Oncology

## 2020-12-13 DIAGNOSIS — C519 Malignant neoplasm of vulva, unspecified: Secondary | ICD-10-CM | POA: Insufficient documentation

## 2020-12-13 MED ORDER — SONAFINE EX EMUL
1.0000 "application " | Freq: Once | CUTANEOUS | Status: AC
  Administered 2020-12-13: 1 via TOPICAL

## 2020-12-14 ENCOUNTER — Other Ambulatory Visit: Payer: Self-pay

## 2020-12-14 ENCOUNTER — Encounter: Payer: Self-pay | Admitting: Radiation Oncology

## 2020-12-14 ENCOUNTER — Ambulatory Visit
Admission: RE | Admit: 2020-12-14 | Discharge: 2020-12-14 | Disposition: A | Discharge status: Home / Self Care | Payer: Medicare HMO | Source: Ambulatory Visit | Attending: Radiation Oncology | Admitting: Radiation Oncology

## 2020-12-14 DIAGNOSIS — C519 Malignant neoplasm of vulva, unspecified: Secondary | ICD-10-CM | POA: Diagnosis not present

## 2020-12-19 DIAGNOSIS — G4733 Obstructive sleep apnea (adult) (pediatric): Secondary | ICD-10-CM | POA: Diagnosis not present

## 2020-12-26 ENCOUNTER — Other Ambulatory Visit: Payer: Self-pay | Admitting: Cardiology

## 2020-12-26 DIAGNOSIS — I10 Essential (primary) hypertension: Secondary | ICD-10-CM

## 2020-12-26 DIAGNOSIS — I2510 Atherosclerotic heart disease of native coronary artery without angina pectoris: Secondary | ICD-10-CM

## 2021-01-01 DIAGNOSIS — I5033 Acute on chronic diastolic (congestive) heart failure: Secondary | ICD-10-CM | POA: Diagnosis not present

## 2021-01-01 DIAGNOSIS — R069 Unspecified abnormalities of breathing: Secondary | ICD-10-CM | POA: Diagnosis not present

## 2021-01-01 DIAGNOSIS — E1165 Type 2 diabetes mellitus with hyperglycemia: Secondary | ICD-10-CM | POA: Diagnosis not present

## 2021-01-07 ENCOUNTER — Other Ambulatory Visit: Payer: Self-pay | Admitting: Cardiology

## 2021-01-08 ENCOUNTER — Ambulatory Visit: Payer: Medicare HMO | Admitting: Cardiology

## 2021-01-08 ENCOUNTER — Encounter: Payer: Self-pay | Admitting: Student

## 2021-01-08 ENCOUNTER — Other Ambulatory Visit: Payer: Self-pay

## 2021-01-08 ENCOUNTER — Encounter: Payer: Self-pay | Admitting: Cardiology

## 2021-01-08 ENCOUNTER — Ambulatory Visit: Payer: Medicare HMO | Admitting: Student

## 2021-01-08 VITALS — BP 124/64 | HR 60 | Temp 98.0°F | Ht 62.0 in | Wt 173.0 lb

## 2021-01-08 VITALS — BP 124/64 | HR 60 | Ht 62.0 in | Wt 173.0 lb

## 2021-01-08 DIAGNOSIS — I5032 Chronic diastolic (congestive) heart failure: Secondary | ICD-10-CM

## 2021-01-08 DIAGNOSIS — Z45018 Encounter for adjustment and management of other part of cardiac pacemaker: Secondary | ICD-10-CM | POA: Diagnosis not present

## 2021-01-08 DIAGNOSIS — I495 Sick sinus syndrome: Secondary | ICD-10-CM | POA: Diagnosis not present

## 2021-01-08 DIAGNOSIS — I6523 Occlusion and stenosis of bilateral carotid arteries: Secondary | ICD-10-CM | POA: Diagnosis not present

## 2021-01-08 DIAGNOSIS — Z950 Presence of cardiac pacemaker: Secondary | ICD-10-CM | POA: Diagnosis not present

## 2021-01-08 DIAGNOSIS — I480 Paroxysmal atrial fibrillation: Secondary | ICD-10-CM | POA: Diagnosis not present

## 2021-01-08 DIAGNOSIS — I10 Essential (primary) hypertension: Secondary | ICD-10-CM

## 2021-01-08 NOTE — Progress Notes (Signed)
Primary Physician/Referring:  Deland Pretty, MD  Patient ID: [name], female    DOB: [birth year], 75 y.o.   MRN: [id number]  Chief Complaint  Patient presents with   Chronic diastolic heart failure    Hypertension   Hyperlipidemia    HPI:    FEMALE IAFRATE  is a 75 y.o. female  with hypertension, type 2 diabetes mellitus with stage 3 CKD, chronic diastolic heart failure, mild hyperlipidemia on Tricor, nonobstructive CAD (Cath 2019), sick sinus syndrome status post dual-chamber pacemaker 2017, OSA on BiPAP, paroxysmal Afib and atrial flutter/atrial tachycardia.  Her last cardioversion was 09/07/2018 for atrial flutter.  Patient was diagnosed with squamous cell carcinoma of the vulva and underwent colectomy 08/22/2020 at [facility].  Postoperatively patient experienced complex complications including hypotension, hypoxic respiratory failure/pulmonary edema, surgical site infection, acute on chronic CKD.  Patient was discharged home with new supplemental oxygen requirement of 3 L.  During postoperative period patient experienced hypotension requiring pressor support.  She subsequently developed acute on chronic diastolic heart failure requiring diuresis.  Patient was seen in outpatient follow-up, uptitrated guideline directed medical therapy and patient clinically improved.  Patient presents for 75-monthfollow-up.  At last office visit no changes were made.  Unfortunately repeat BNP and lipid profile testing have not been done.  She will be due for repeat carotid surveillance and 08/2021.  Echocardiogram 11/19/2020 noted LVEF 55% with only mildly dilated left atrium, moderate MR. Patient has recently finished cancer treatment.  She is scheduled for pacemaker check later today.  Patient states she is feeling very well.  Reports improved energy and activity level, she states she is feeling significantly improved overall. Denies chest pain, dyspnea, syncope, near syncope, leg edema, orthopnea, PND.  Past  Medical History:  Diagnosis Date   Arthritis    Diabetes mellitus without complication ([facility]    Dyspnea    Encounter for care of pacemaker 09/03/2018   Family history of kidney cancer    Family history of throat cancer    Family history of uterine cancer    History of chickenpox    Hx of psoriatic arthritis    Hyperlipemia    Hypertension    Hypertension 05/03/2018   Hypothyroidism    Mitral valve disorders(424.0)    Obstructive sleep apnea    Pacemaker    Paroxysmal atrial fibrillation ([facility] 10/11/2016   Peripheral neuropathy    Renal disorder Kidney disease stage2   Thyroid disease hypothyroid   Urine incontinence    Vitamin D deficiency    Past Surgical History:  Procedure Laterality Date   BACK SURGERY     BACK SURGERY     CARDIAC CATHETERIZATION N/A 04/29/2015   Procedure: Temporary Pacemaker;  Surgeon: JAdrian Prows MD;  [facility];  Service: Cardiovascular;  Laterality: N/A;   CARDIOVERSION N/A 09/07/2018   Procedure: CARDIOVERSION;  Surgeon: GAdrian Prows MD;  [place]  Service: Cardiovascular;  Laterality: N/A;   CARDIOVERSION     COLONOSCOPY     EP IMPLANTABLE DEVICE N/A 04/30/2015   Procedure: Pacemaker Implant;  Surgeon: GEvans Lance MD;  [facility];  Service: Cardiovascular;  Laterality: N/A;   INTRAVASCULAR PRESSURE WIRE/FFR STUDY N/A 08/31/2017   Procedure: INTRAVASCULAR PRESSURE WIRE/FFR STUDY;  Surgeon: PNigel Mormon MD;  [facility];  Service: Cardiovascular;  Laterality: N/A;   LEFT AND RIGHT HEART CATHETERIZATION WITH CORONARY ANGIOGRAM N/A 09/30/2011   Procedure: LEFT AND RIGHT HEART CATHETERIZATION  WITH CORONARY ANGIOGRAM;  Surgeon: Laverda Page, MD;  Location: [facility] CATH LAB;  Service: Cardiovascular;  Laterality: N/A;   RIGHT/LEFT HEART CATH AND CORONARY ANGIOGRAPHY N/A 08/31/2017   Procedure: RIGHT/LEFT HEART CATH AND CORONARY ANGIOGRAPHY;  Surgeon: Nigel Mormon, MD;   Location: [place] [facility];  Service: Cardiovascular;  Laterality: N/A;   TOOTH EXTRACTION  10/07/2018   TUBAL LIGATION     VULVA Milagros Loll BIOPSY N/A 07/26/2020   Procedure: VULVAR BIOPSY;  Surgeon: Lafonda Mosses, MD;  Location: [facility];  Service: Gynecology;  Laterality: N/A;   YAG LASER APPLICATION Right 58/09/9831   Procedure: YAG LASER APPLICATION;  Surgeon: Elta Guadeloupe T. Gershon Crane, MD;  Location: [facility];  Service: Ophthalmology;  Laterality: Right;  pt knows to arrive at 82:50   YAG LASER APPLICATION Left 53/97/6734   Procedure: YAG LASER APPLICATION;  Surgeon: Rutherford Guys, MD;  Location: [facility];  Service: Ophthalmology;  Laterality: Left;   Family History  Problem Relation Age of Onset   Arthritis Mother    Diabetes Mother    Heart disease Mother    Hyperlipidemia Mother    Hypertension Mother    Arthritis Father    Asthma Father    Heart attack Father    Hyperlipidemia Father    Hypertension Father    Stroke Father    Arthritis Sister    Diabetes Sister    Hypertension Sister    Arthritis Sister    Diabetes Sister    Hyperlipidemia Sister    Hypertension Sister    Stroke Sister    Ovarian cancer Sister 65   Pancreatic cancer Sister 71   Uterine cancer Sister 24   Hyperlipidemia Sister    Hypertension Sister    Arthritis Brother    Heart attack Brother    Heart disease Brother    Hyperlipidemia Brother    Hypertension Brother    Alcohol abuse Brother    Arthritis Brother    Diabetes Brother    Early death Brother    Heart disease Brother    Hyperlipidemia Brother    Hypertension Brother    Cancer Maternal Aunt 70       unknown type   Throat cancer Maternal Grandfather        hx smoking/drinking   Hypertension Daughter    Cancer Daughter        "female cancer cells"   Kidney cancer Nephew        dx 36s   Colon cancer Neg Hx    Breast cancer Neg Hx    Prostate cancer Neg Hx     Social History   Tobacco Use   Smoking status: Never   Smokeless  tobacco: Never  Substance Use Topics   Alcohol use: No    Alcohol/week: 0.0 standard drinks   Marital Status: Married  ROS  Review of Systems  Constitutional: Negative for malaise/fatigue and weight gain.  Cardiovascular:  Negative for chest pain, claudication, dyspnea on exertion, leg swelling, near-syncope, orthopnea, palpitations, paroxysmal nocturnal dyspnea and syncope.  Hematologic/Lymphatic: Does not bruise/bleed easily.  Neurological:  Negative for dizziness.  Objective  Blood pressure 124/64, pulse 60, temperature 98 F (36.7 C), temperature source Temporal, height 5' 2"  (1.575 m), weight 173 lb (78.5 kg), SpO2 96 %.  Vitals with BMI 01/08/2021 01/08/2021 01/08/2021  Height - - 5' 2"   Weight - - 173 lbs  BMI - - 19.37  Systolic 902 409 735  Diastolic 64 63 52  Pulse - 60  60     Physical Exam Vitals reviewed.  Constitutional:      Appearance: She is well-developed. She is obese.     Comments: Patient appears more energetic and previous to office visits.  She is walking without assistive device, and attends visit independently.   HENT:     Head: Normocephalic and atraumatic.  Neck:     Vascular: No JVD.  Cardiovascular:     Rate and Rhythm: Normal rate and regular rhythm.     Pulses: Intact distal pulses.          Carotid pulses are 2+ on the right side and 2+ on the left side with bruit.      Popliteal pulses are 1+ on the right side and 1+ on the left side.       Dorsalis pedis pulses are 1+ on the right side and 1+ on the left side.       Posterior tibial pulses are 1+ on the right side and 1+ on the left side.     Heart sounds: S1 normal and S2 normal. Murmur heard.  Midsystolic murmur is present with a grade of 2/6 at the upper right sternal border and apex.    No gallop.  Pulmonary:     Effort: Pulmonary effort is normal. No accessory muscle usage or respiratory distress.     Breath sounds: Normal breath sounds. No wheezing, rhonchi or rales.   Musculoskeletal:     Right lower leg: No edema.     Left lower leg: No edema.  Skin:    General: Skin is warm and dry.  Neurological:     Mental Status: She is alert.   Laboratory examination:   Recent Labs    07/26/20 1115 09/03/20 1224 09/06/20 1323  NA 139 141 139  K 4.0 5.1 4.9  CL 105 103 92*  CO2 27 29 30*  GLUCOSE 158* 138* 151*  BUN 22 28* 40*  CREATININE 1.12* 1.41* 1.48*  CALCIUM 10.3 10.3 10.0  GFRNONAA 51* 38*  --    CrCl cannot be calculated (Patient's most recent lab result is older than the maximum 21 days allowed.).  CMP Latest Ref Rng & Units 09/06/2020 09/03/2020 07/26/2020  Glucose 65 - 99 mg/dL 151(H) 138(H) 158(H)  BUN 8 - 27 mg/dL 40(H) 28(H) 22  Creatinine 0.57 - 1.00 mg/dL 1.48(H) 1.41(H) 1.12(H)  Sodium 134 - 144 mmol/L 139 141 139  Potassium 3.5 - 5.2 mmol/L 4.9 5.1 4.0  Chloride 96 - 106 mmol/L 92(L) 103 105  CO2 20 - 29 mmol/L 30(H) 29 27  Calcium 8.7 - 10.3 mg/dL 10.0 10.3 10.3  Total Protein 6.5 - 8.1 g/dL - 6.8 -  Total Bilirubin 0.3 - 1.2 mg/dL - 0.3 -  Alkaline Phos 38 - 126 U/L - 38 -  AST 15 - 41 U/L - 13(L) -  ALT 0 - 44 U/L - 13 -   CBC Latest Ref Rng & Units 09/03/2020 07/26/2020 08/26/2019  WBC 4.0 - 10.5 K/uL 8.1 7.9 7.1  Hemoglobin 12.0 - 15.0 g/dL 9.9(L) 13.4 13.6  Hematocrit 36.0 - 46.0 % 31.0(L) 41.7 43.0  Platelets 150 - 400 K/uL 223 148(L) 155   Lipid Panel     Component Value Date/Time   CHOL 168 08/29/2017 0328   TRIG 544 (H) 08/29/2017 0328   HDL 19 (L) 08/29/2017 0328   CHOLHDL 8.8 08/29/2017 0328   VLDL UNABLE TO CALCULATE IF TRIGLYCERIDE OVER 400 mg/dL 08/29/2017 0328   LDLCALC  UNABLE TO CALCULATE IF TRIGLYCERIDE OVER 400 mg/dL 08/29/2017 0328   HEMOGLOBIN A1C Lab Results  Component Value Date   HGBA1C 7.8 (H) 07/26/2020   MPG 177.16 07/26/2020   TSH No results for input(s): TSH in the last 8760 hours.   External labs:  10/25/2019: Sodium 142, glucose 135, BUN 24, GFR 42, potassium 5.5, AST 22, ALT  37, alk phos 25 A1c 6.9% Total cholesterol 177, triglycerides 240, LDL 100, HDL 29 TSH 3.61  Cholesterol, total 168.000 m 08/29/2017 HDL 17.000 mg 06/09/2019 LDL-C 100.000 ca 06/09/2019 Triglycerides 343.000 m 06/09/2019  A1C 7.200 % 06/24/2019 TSH 5.100 08/31/2019  Hemoglobin 13.600 g/d 08/26/2019 Platelets 155.000 K/ 08/26/2019  Creatinine, Serum 1.340 mg/ 08/26/2019 Potassium 4.700 mm 08/26/2019 Magnesium 1.700 MG/ 04/24/2015 ALT (SGPT) 21.000 IU/ 06/24/2019  05/21/2018: HbA1c 6.7. Lipid Panel: Chol 146, Trig 321, HDL 17, LDL 65, VLDL 64. ALK Ph 32.   08/28/17: TSH Normal  Allergies   Allergies  Allergen Reactions   Hydrocodone Other (See Comments)    Lethargic    Invokana [Canagliflozin] Palpitations and Other (See Comments)    Made heart race   Oxycodone Other (See Comments)    Lethargic     Medications Prior to Visit:   Outpatient Medications Prior to Visit  Medication Sig Dispense Refill   amiodarone (PACERONE) 200 MG tablet Take 1/2 (one-half) tablet by mouth once daily (Patient taking differently: Take 100 mg by mouth daily.) 45 tablet 3   amLODipine (NORVASC) 5 MG tablet      Blood Glucose Monitoring Suppl (ACCU-CHEK AVIVA PLUS) w/Device KIT      ELIQUIS 5 MG TABS tablet Take 1 tablet by mouth twice daily 180 tablet 0   empagliflozin (JARDIANCE) 25 MG TABS tablet Take 1 tablet (25 mg total) by mouth daily before breakfast. 30 tablet 6   EUTHYROX 88 MCG tablet Take 88 mcg by mouth daily.     fenofibrate 160 MG tablet Take 160 mg by mouth daily with supper.      hydrocortisone (ANUSOL-HC) 25 MG suppository Place 1 suppository (25 mg total) rectally 2 (two) times daily. 12 suppository 0   insulin aspart protamine- aspart (NOVOLOG MIX 70/30) (70-30) 100 UNIT/ML injection Inject 35-45 Units into the skin See admin instructions. Inject 45 units into the skin with breakfast, 35 units with supper (may adjust based on blood sugar readings)     isosorbide mononitrate (IMDUR) 60  MG 24 hr tablet Take 1 tablet by mouth once daily 90 tablet 0   lidocaine (XYLOCAINE) 5 % ointment Apply 1 application topically 3 (three) times daily as needed. 35.44 g 2   metoprolol tartrate (LOPRESSOR) 50 MG tablet Take 1 tablet by mouth twice daily 180 tablet 0   nitroGLYCERIN (NITROSTAT) 0.4 MG SL tablet Place 1 tablet (0.4 mg total) under the tongue every 5 (five) minutes as needed for chest pain. 25 tablet 1   olmesartan-hydrochlorothiazide (BENICAR HCT) 40-25 MG tablet Take 1 tablet by mouth daily. 90 tablet 3   spironolactone (ALDACTONE) 25 MG tablet Take 1 tablet by mouth once daily 90 tablet 0   SURE COMFORT INSULIN SYRINGE 31G X 5/16" 0.5 ML MISC      torsemide (DEMADEX) 10 MG tablet Take 1 tablet (10 mg total) by mouth 2 (two) times daily. (Patient taking differently: Take 10 mg by mouth as needed.) 30 tablet 3   traMADol (ULTRAM) 50 MG tablet Take 1 tablet (50 mg total) by mouth every 6 (six) hours as needed for severe  pain. Do not take and drive 30 tablet 0   No facility-administered medications prior to visit.   Final Medications at End of Visit    Current Meds  Medication Sig   amiodarone (PACERONE) 200 MG tablet Take 1/2 (one-half) tablet by mouth once daily (Patient taking differently: Take 100 mg by mouth daily.)   amLODipine (NORVASC) 5 MG tablet    Blood Glucose Monitoring Suppl (ACCU-CHEK AVIVA PLUS) w/Device KIT    ELIQUIS 5 MG TABS tablet Take 1 tablet by mouth twice daily   empagliflozin (JARDIANCE) 25 MG TABS tablet Take 1 tablet (25 mg total) by mouth daily before breakfast.   EUTHYROX 88 MCG tablet Take 88 mcg by mouth daily.   fenofibrate 160 MG tablet Take 160 mg by mouth daily with supper.    hydrocortisone (ANUSOL-HC) 25 MG suppository Place 1 suppository (25 mg total) rectally 2 (two) times daily.   insulin aspart protamine- aspart (NOVOLOG MIX 70/30) (70-30) 100 UNIT/ML injection Inject 35-45 Units into the skin See admin instructions. Inject 45 units into  the skin with breakfast, 35 units with supper (may adjust based on blood sugar readings)   isosorbide mononitrate (IMDUR) 60 MG 24 hr tablet Take 1 tablet by mouth once daily   lidocaine (XYLOCAINE) 5 % ointment Apply 1 application topically 3 (three) times daily as needed.   metoprolol tartrate (LOPRESSOR) 50 MG tablet Take 1 tablet by mouth twice daily   nitroGLYCERIN (NITROSTAT) 0.4 MG SL tablet Place 1 tablet (0.4 mg total) under the tongue every 5 (five) minutes as needed for chest pain.   olmesartan-hydrochlorothiazide (BENICAR HCT) 40-25 MG tablet Take 1 tablet by mouth daily.   spironolactone (ALDACTONE) 25 MG tablet Take 1 tablet by mouth once daily   SURE COMFORT INSULIN SYRINGE 31G X 5/16" 0.5 ML MISC    torsemide (DEMADEX) 10 MG tablet Take 1 tablet (10 mg total) by mouth 2 (two) times daily. (Patient taking differently: Take 10 mg by mouth as needed.)   traMADol (ULTRAM) 50 MG tablet Take 1 tablet (50 mg total) by mouth every 6 (six) hours as needed for severe pain. Do not take and drive   Radiology:   No results found.  Cardiac Studies:    Lexiscan myoview stress test 06/15/2017: 1. Lexiscan stress test was performed. Exercise capacity was not assessed. Stress symptoms included dizziness. Peak blood pressure 158/66 mmHg. Stress EKG is non diagnostic for ischemia as it is a pharmacologic stress. In addition, it demonstrated atrial pacing and incomplete LBBB. 2. The overall quality of the study is excellent. There is no evidence of abnormal lung activity. Stress and rest SPECT images demonstrate homogeneous tracer distribution throughout the myocardium. Gated SPECT imaging reveals normal myocardial thickening and wall motion. The left ventricular ejection fraction was normal (71%). 3. Low risk study.   Sleep Study 08/11/2017: Positive for Complex Sleep Apnea; On CPAP    R&LHC 08/31/2017: LCx: Nondominant. AV grove LCx 50-60% diffuse disease. RCA: Large dominant. Ostial PDA 50%  stenosis, FFR 0.96. No change from 09/30/2011. RA: 5 mmHg RV: 45/4 mmHg. PA: 40/12 mmHg. Mean PA 29 mmHg PCWP: 16 mmHg LV: 130/3 mmHg, LVEDP 13 mmHg CO: 3.6 L/min. CI 1.9 L/min/m2  Direct current cardioversion 09/07/2018: Indication symptomatic A. Flutter. Procedure: Using 50 mg of IV Propofol and 20 IV Lidocaine (for reducing venous pain) for achieving deep sedation, synchronized direct current cardioversion performed. Patient was delivered with 120 Joules of electricity X 1 with success to A-Paced Rhythm. Patient tolerated  the procedure well. No immediate complication noted.  Carotid artery duplex 09/06/2020:  Duplex suggests stenosis in the right internal carotid artery (16-49%).  Duplex suggests stenosis in the right external carotid artery (<50%).  Duplex suggests stenosis in the left internal carotid artery (16-49%).  Antegrade right vertebral artery flow. Antegrade left vertebral artery flow.  Compared to 02/09/2014, mild progression of disease in bilateral carotid arteries. Follow up in one year is appropriate if clinically indicated.  PCV ECHOCARDIOGRAM COMPLETE 11/19/2020 Left ventricle cavity is normal in size. Moderate concentric hypertrophy of the left ventricle. Normal global wall motion. Normal LV systolic function with EF 55%. Diastolic function could not be assessed due to paced rhythm. Left atrial cavity is mildly dilated. Mild thickening of mitral valve. Mildly restricted mitral valve leaflets without definite stenosis.  Moderate (Grade III) mitral regurgitation. Mild tricuspid regurgitation. Estimated pulmonary artery systolic pressure 37 mmHg. Previous study in 2020 noted severe LA dilatation. No other significant change noted.  Pacemaker:  Remote dual-chamber pacemaker transmission 06/20/2020: Longevity 9 years. Lead impedance and thresholds within normal limits. There was no mode switch, no ventricular high rate episodes. AP 99 %, VP 6%. Normal pacemaker  function.  Scheduled In office pacemaker check 08/30/2018:  There were 6 AHR Episodes Since 08/08/2018, EGM recordings reveal maximum a rate between 180-201 bpm,, The longest His persistent since 08/30/2018,Atrial rate 180, ventricular rate 1 28 bpm, EGM's suggest atrial tachycardia/AFL. There was 0.4% truly due to atrial arrhythmia burden, pacemaker thresholds and impedance stable. Longevity 8.5, 12 years  EKG:   05/18/2020: Atrial paced rhythm with first-degree AV block at a rate of 60 bpm.  Left axis, left anterior fascicular block.  Poor R wave progression, cannot exclude anteroseptal infarct old.  IVCD.  Compared to EKG 11/10/2019, no significant change.  11/10/2019: Atrially paced rhythm with first-degree AV block, ventricularly sensed.  Ventricular rate 60 bpm.  Left axis deviation, left intrafascicular block.  Anteroseptal infarct old.  IVCD, borderline criteria for LVH.  Nonspecific T abnormality.  No significant change from 01/11/2019.  08/30/2018: Atypical atrial flutter with 3:1 conduction, V rate 110 bpm, left axis deviation. LBBB.   Assessment     ICD-10-CM   1. Chronic diastolic heart failure (HCC)  I50.32 EKG 12-Lead    2. Essential hypertension  I10     3. Bilateral carotid artery stenosis  I65.23     4. Paroxysmal atrial fibrillation (HCC)  I48.0       No orders of the defined types were placed in this encounter.     There are no discontinued medications.    This patients CHA2DS2-VASc Score 7 (CHF, HTN, DM, Vasc, Age, F) and yearly risk of stroke >9.8%.   Recommendations:   CAMYA HAYDON  is a 75 y.o. female  with hypertension, type 2 diabetes mellitus with stage 3 CKD, chronic diastolic heart failure, mild hyperlipidemia on fenobibrate, nonobstructive CAD (Cath 2019), sick sinus syndrome status post dual-chamber pacemaker 2017, OSA on BiPAP, paroxysmal Afib and atrial flutter/atrial tachycardia.  Her last cardioversion was 09/07/2018 for atrial flutter.  Patient  was diagnosed with squamous cell carcinoma of the vulva and underwent colectomy 08/22/2020 at [facility].  Postoperatively patient experienced complex complications including hypotension, hypoxic respiratory failure/pulmonary edema, surgical site infection, acute on chronic CKD.  Patient was discharged home with new supplemental oxygen requirement of 3 L.  During postoperative period patient experienced hypotension requiring pressor support.  She subsequently developed acute on chronic diastolic heart failure requiring diuresis.  Patient was  seen in outpatient follow-up, uptitrated guideline directed medical therapy and patient clinically improved.  Patient presents for 103-monthfollow-up.  At last office visit no changes were made.  Unfortunately repeat BNP and lipid profile testing have not been done.  She will be due for repeat carotid surveillance and 08/2021.  Echocardiogram 11/19/2020 noted LVEF 55% with only mildly dilated left atrium, moderate MR. Overall patient is doing quite well and tolerating guideline directed medical therapy without issue.  Blood pressure was initially elevated in the office today, however it is well controlled upon recheck.  Unfortunately repeat BMP and lipid profile testing have not been done.  However patient is euvolemic on exam without complaints, therefore will defer repeat laboratory testing to PCP.  She continues to tolerate Eliquis without bleeding diathesis.  Will not make changes to medications at this time.  Follow-up in 6 months, sooner if needed, for HFpEF, hypertension, hyperlipidemia, sick sinus syndrome status post pacemaker implant.   CAlethia Berthold PA-C 01/08/2021, 11:37 AM Office: [phone]

## 2021-01-08 NOTE — Progress Notes (Addendum)
Chief Complaint  Patient presents with   Pacemaker Check   Remote dual-chamber pacemaker transmission 09/19/2021: Longevity 9 years.  Lead impedance and thresholds within normal limits.  AP 92%, VP 8%. There were no mode switches, no ventricular high rate episodes.  Normal pacemaker function.  Scheduled  In office pacemaker check 01/08/21  Single (S)/Dual (D)/BV: D. Presenting AP-VS Pacemaker dependant:  No. Underlying NSR. AP 96.5%, VP 1.2%. AMS Episodes 0.  AT/AF burden 0% . Longest NA. HVR 0. Longevity 8.5 Years. Magnet rate: >85%. Lead measurements: Stable. Histogram: Low (L)/normal (N)/high (H)  Normal. Patient activity Low.   Observations: Normal dual-chamber pacemaker function. Changes: No changes.    Adrian Prows, MD, [facility] 01/08/2021, 5:12 PM Office: [phone] Fax: [phone] Pager: [phone]

## 2021-01-11 ENCOUNTER — Encounter: Payer: Self-pay | Admitting: Radiation Oncology

## 2021-01-16 NOTE — Progress Notes (Incomplete)
°  Radiation Oncology         ([phone]) [phone] ________________________________  Patient Name: [name] MRN: [id number] DOB: [birth year] Referring Physician: Jeral Pinch Date of Service: 12/14/2020 [facility] [place]                                                        End Of Treatment Note  Diagnoses: C51.9-Malignant neoplasm of vulva, unspecified  Cancer Staging: Stage IB grade 3 squamous cell carcinoma of the vulva, HPV - independent  Intent: Curative  Radiation Treatment Dates: 10/23/2020 through 12/14/2020 Site Technique Total Dose (Gy) Dose per Fx (Gy) Completed Fx Beam Energies  Vulva: Pelvis IMRT 50.4/50.4 1.8 28/28 6X  Vulva: Pelvis_Bst IMRT 9/9 1.8 5/5 6X   Narrative: The patient tolerated radiation therapy relatively well. Patient continues to have severe pain to buttocks and increased fatigue.  Denies bladder issues. She reports having soft stools with mucous. Continues to have irritations to buttocks for which she is currently using Neosporin.  On pelvic examination, there is no moist desquamation at this time.  She has some dry desquamation and skin peeling along the buttocks area.  No signs of infection. She has been given sonofine to apply in the areas outside of the vulvovaginal area.    Plan: The patient will follow-up with radiation oncology in one month .  ________________________________________________ -----------------------------------  Blair Promise, PhD, MD  This document serves as a record of services personally performed by Gery Pray, MD. It was created on his behalf by Roney Mans, a trained medical scribe. The creation of this record is based on the scribe's personal observations and the provider's statements to them. This document has been checked and approved by the attending provider.

## 2021-01-16 NOTE — Progress Notes (Signed)
Radiation Oncology         ([phone]) [phone] ________________________________  Name: [name] MRN: [id number]  Date: 01/17/2021  DOB: 10-15-1945  Follow-Up Visit Note  CC: Deland Pretty, MD  Lafonda Mosses, MD    ICD-10-CM   1. 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  We also electively treated the left inguinal region.  Narrative:  The patient returns today for routine follow-up.  The patient tolerated radiation therapy relatively well. She reported continued severe pain to the buttocks, increased fatigue, and soft stools with mucous on the day of her final treatment. For irritation to buttocks, she used Neosporin. On pelvic examination on the day of her final treatment, no moist desquamation was appreciated, though some dry desquamation and skin peeling along the buttocks area was noted.  No signs of infection were otherwise appreciated. She had been given sonofine to apply to the areas outside of the vulvovaginal area on the day of her final treatment.    Since her initial consultation date of 10/03/20, the patient followed up with Dr. Berline Lopes on 10/22/20 prior to starting RT. During which time, the patient reported wearing her oxygen at night, and reported improvement in her breathing. Patient also reported continued SOB on exertion. Pelvic exam performed during this visit revealed  some mild erythema of the vulva, not consistent with infection.  Her incision appeared  completely healed with the exception of about a 3 cm area towards the anterior vagina which was noted as minimally desquamative.        Patient now reports feeling much better.  Her energy level is almost back to normal.  She denies any itching or burning in the vulvar region.  She denies seeing any blood.  She reports her skin is healed well at this time.  She continues to have some loose stools and takes Imodium on occasion for this issue.  She occasionally will have some urge fecal incontinence.  She denies any vaginal bleeding or hematuria.  She denies any difficulties with urination.                Allergies:  is allergic to hydrocodone, invokana [canagliflozin], and oxycodone.  Meds: Current Outpatient Medications  Medication Sig Dispense Refill   amiodarone (PACERONE) 200 MG tablet Take 1/2 (one-half) tablet by mouth once daily (Patient taking differently: Take 100 mg by mouth daily.) 45 tablet 3   amLODipine (NORVASC) 5 MG tablet      Blood Glucose Monitoring Suppl (ACCU-CHEK AVIVA PLUS) w/Device KIT      ELIQUIS 5 MG TABS tablet Take 1 tablet by mouth twice daily 180 tablet 0   empagliflozin (JARDIANCE) 25 MG TABS tablet Take 1 tablet (25 mg total) by mouth daily before breakfast. 30 tablet 6   EUTHYROX 88 MCG tablet Take 88 mcg by mouth daily.     fenofibrate 160 MG tablet Take 160 mg by mouth daily with supper.  insulin aspart protamine- aspart (NOVOLOG MIX 70/30) (70-30) 100 UNIT/ML injection Inject 35-45 Units into the skin See admin instructions. Inject 45 units into the skin with breakfast, 35 units with supper (may adjust based on blood sugar readings)     isosorbide mononitrate (IMDUR) 60 MG 24 hr tablet Take 1 tablet by mouth once daily 90 tablet 0   metoprolol tartrate (LOPRESSOR) 50 MG tablet Take 1 tablet by mouth twice daily 180 tablet 0   olmesartan-hydrochlorothiazide (BENICAR HCT) 40-25 MG tablet Take 1 tablet by mouth daily. 90 tablet 3   spironolactone (ALDACTONE)  25 MG tablet Take 1 tablet by mouth once daily 90 tablet 0   SURE COMFORT INSULIN SYRINGE 31G X 5/16" 0.5 ML MISC      hydrocortisone (ANUSOL-HC) 25 MG suppository Place 1 suppository (25 mg total) rectally 2 (two) times daily. (Patient not taking: Reported on 01/17/2021) 12 suppository 0   lidocaine (XYLOCAINE) 5 % ointment Apply 1 application topically 3 (three) times daily as needed. (Patient not taking: Reported on 01/17/2021) 35.44 g 2   nitroGLYCERIN (NITROSTAT) 0.4 MG SL tablet Place 1 tablet (0.4 mg total) under the tongue every 5 (five) minutes as needed for chest pain. (Patient not taking: Reported on 01/17/2021) 25 tablet 1   torsemide (DEMADEX) 10 MG tablet Take 1 tablet (10 mg total) by mouth 2 (two) times daily. (Patient not taking: Reported on 01/17/2021) 30 tablet 3   traMADol (ULTRAM) 50 MG tablet Take 1 tablet (50 mg total) by mouth every 6 (six) hours as needed for severe pain. Do not take and drive (Patient not taking: Reported on 01/17/2021) 30 tablet 0   No current facility-administered medications for this encounter.    Physical Findings: The patient is in no acute distress. Patient is alert and oriented.  height is 5' 2" (1.575 m) and weight is 175 lb 2 oz (79.4 kg). Her temporal temperature is 96.4 F (35.8 C) (abnormal). Her blood pressure is 143/57 (abnormal) and her pulse is 60. Her respiration is 18 and oxygen saturation is 98%. .   Lungs are clear to auscultation bilaterally. Heart has regular rate and rhythm. No palpable cervical, supraclavicular, or axillary adenopathy. Abdomen soft, non-tender, normal bowel sounds.  Pelvic exam not performed today at patient's request of.  She will see Dr. Berline Lopes in 2 months for detailed exam.   Lab Findings: Lab Results  Component Value Date   WBC 8.1 09/03/2020   HGB 9.9 (L) 09/03/2020   HCT 31.0 (L) 09/03/2020   MCV 90.4 09/03/2020   PLT 223 09/03/2020    Radiographic Findings: No results found.  Impression:  Stage IB grade 3  squamous cell carcinoma of the vulva, HPV - independent  The patient is recovering from the effects of radiation.  Overall she is recovered well at this time.  We talked about using a vaginal dilator.  The patient was given a vaginal dilator and instructions on its use.  We discussed that sometimes radiation can cause some narrowing of the distal vaginal area making exams more difficult.  We also talked about potential long-term effects of radiation directed at the left inguinal and vulvar region.  Plan: She will follow-up with Dr. Berline Lopes in 2 months.  Patient prefers to follow-up with Dr. Berline Lopes exclusively at this time.  I have asked that the patient talk with Dr. Berline Lopes about the need for vaginal dilator at that follow-up.  We discussed that I am always available for questions or exams as needed.  ____________________________________  Blair Promise, PhD, MD   This document serves as a record of services personally performed by Gery Pray, MD. It was created on his behalf by Roney Mans, a trained medical scribe. The creation of this record is based on the scribe's personal observations and the provider's statements to them. This document has been checked and approved by the attending provider.

## 2021-01-17 ENCOUNTER — Other Ambulatory Visit: Payer: Self-pay

## 2021-01-17 ENCOUNTER — Encounter: Payer: Self-pay | Admitting: Radiation Oncology

## 2021-01-17 ENCOUNTER — Ambulatory Visit
Admission: RE | Admit: 2021-01-17 | Discharge: 2021-01-17 | Disposition: A | Discharge status: Home / Self Care | Payer: Medicare HMO | Source: Ambulatory Visit | Attending: Radiation Oncology | Admitting: Radiation Oncology

## 2021-01-17 DIAGNOSIS — Z7984 Long term (current) use of oral hypoglycemic drugs: Secondary | ICD-10-CM | POA: Insufficient documentation

## 2021-01-17 DIAGNOSIS — C519 Malignant neoplasm of vulva, unspecified: Secondary | ICD-10-CM | POA: Insufficient documentation

## 2021-01-17 DIAGNOSIS — Z7901 Long term (current) use of anticoagulants: Secondary | ICD-10-CM | POA: Diagnosis not present

## 2021-01-17 DIAGNOSIS — Z923 Personal history of irradiation: Secondary | ICD-10-CM | POA: Insufficient documentation

## 2021-01-17 DIAGNOSIS — R159 Full incontinence of feces: Secondary | ICD-10-CM | POA: Diagnosis not present

## 2021-01-17 DIAGNOSIS — Z79899 Other long term (current) drug therapy: Secondary | ICD-10-CM | POA: Insufficient documentation

## 2021-01-17 DIAGNOSIS — R0602 Shortness of breath: Secondary | ICD-10-CM | POA: Insufficient documentation

## 2021-01-17 HISTORY — DX: Personal history of irradiation: Z92.3

## 2021-01-17 NOTE — Progress Notes (Addendum)
[name] is here today for follow up post radiation to the pelvis.  They completed their radiation on: 12/14/20  Does the patient complain of any of the following:  Pain:Patient denies pain. Abdominal bloating: no  Diarrhea/Constipation: Patient reports having frequent loose stools. Patient reports taking Imodium which was effective.  Nausea/Vomiting: no Vaginal Discharge: Patient reports having dark discharge in the morning.  Blood in Urine or Stool: no Urinary Issues (dysuria/incomplete emptying/ incontinence/ increased frequency/urgency): no Does patient report using vaginal dilator 2-3 times a week and/or sexually active 2-3 weeks: Patient given xs+ and S dilators with instructions. Patient voiced understanding.  Post radiation skin changes: Patient reports improvement to skin in pelvic area. Patient reports no longer having blisters to pelvic area.    Additional comments if applicable:   Vitals:   01/17/21 0852  BP: (!) 143/57  Pulse: 60  Resp: 18  Temp: (!) 96.4 F (35.8 C)  TempSrc: Temporal  SpO2: 98%  Weight: 175 lb 2 oz (79.4 kg)  Height: 5\' 2"  (1.575 m)

## 2021-01-19 DIAGNOSIS — G4733 Obstructive sleep apnea (adult) (pediatric): Secondary | ICD-10-CM | POA: Diagnosis not present

## 2021-01-21 DIAGNOSIS — G4733 Obstructive sleep apnea (adult) (pediatric): Secondary | ICD-10-CM | POA: Diagnosis not present

## 2021-02-01 DIAGNOSIS — R069 Unspecified abnormalities of breathing: Secondary | ICD-10-CM | POA: Diagnosis not present

## 2021-02-01 DIAGNOSIS — I5033 Acute on chronic diastolic (congestive) heart failure: Secondary | ICD-10-CM | POA: Diagnosis not present

## 2021-02-06 ENCOUNTER — Telehealth: Payer: Self-pay | Admitting: *Deleted

## 2021-02-06 NOTE — Telephone Encounter (Signed)
CALLED PATIENT TO INFORM OF FU APPT. WITH DR. Berline Lopes ON 03-22-21- ARRIVAL TIME- 1:30 PM, SPOKE WITH PATIENT AND SHE IS AWARE OF THIS APPT.

## 2021-02-06 NOTE — Telephone Encounter (Signed)
Enid Derry from radiation called and scheduled a follow up appt the patient in March. Enid Derry from radiation will contact the patient

## 2021-02-10 DIAGNOSIS — N183 Chronic kidney disease, stage 3 unspecified: Secondary | ICD-10-CM | POA: Diagnosis not present

## 2021-02-10 DIAGNOSIS — I130 Hypertensive heart and chronic kidney disease with heart failure and stage 1 through stage 4 chronic kidney disease, or unspecified chronic kidney disease: Secondary | ICD-10-CM | POA: Diagnosis not present

## 2021-02-10 DIAGNOSIS — I5032 Chronic diastolic (congestive) heart failure: Secondary | ICD-10-CM | POA: Diagnosis not present

## 2021-02-10 DIAGNOSIS — E1122 Type 2 diabetes mellitus with diabetic chronic kidney disease: Secondary | ICD-10-CM | POA: Diagnosis not present

## 2021-02-12 DIAGNOSIS — N183 Chronic kidney disease, stage 3 unspecified: Secondary | ICD-10-CM | POA: Diagnosis not present

## 2021-02-12 DIAGNOSIS — E1122 Type 2 diabetes mellitus with diabetic chronic kidney disease: Secondary | ICD-10-CM | POA: Diagnosis not present

## 2021-02-12 DIAGNOSIS — I130 Hypertensive heart and chronic kidney disease with heart failure and stage 1 through stage 4 chronic kidney disease, or unspecified chronic kidney disease: Secondary | ICD-10-CM | POA: Diagnosis not present

## 2021-02-12 DIAGNOSIS — I5032 Chronic diastolic (congestive) heart failure: Secondary | ICD-10-CM | POA: Diagnosis not present

## 2021-02-19 DIAGNOSIS — G4733 Obstructive sleep apnea (adult) (pediatric): Secondary | ICD-10-CM | POA: Diagnosis not present

## 2021-02-24 ENCOUNTER — Other Ambulatory Visit: Payer: Self-pay | Admitting: Cardiology

## 2021-03-03 ENCOUNTER — Other Ambulatory Visit: Payer: Self-pay | Admitting: Cardiology

## 2021-03-04 DIAGNOSIS — R069 Unspecified abnormalities of breathing: Secondary | ICD-10-CM | POA: Diagnosis not present

## 2021-03-04 DIAGNOSIS — I5033 Acute on chronic diastolic (congestive) heart failure: Secondary | ICD-10-CM | POA: Diagnosis not present

## 2021-03-12 DIAGNOSIS — I130 Hypertensive heart and chronic kidney disease with heart failure and stage 1 through stage 4 chronic kidney disease, or unspecified chronic kidney disease: Secondary | ICD-10-CM | POA: Diagnosis not present

## 2021-03-12 DIAGNOSIS — N183 Chronic kidney disease, stage 3 unspecified: Secondary | ICD-10-CM | POA: Diagnosis not present

## 2021-03-12 DIAGNOSIS — I5032 Chronic diastolic (congestive) heart failure: Secondary | ICD-10-CM | POA: Diagnosis not present

## 2021-03-12 DIAGNOSIS — E1122 Type 2 diabetes mellitus with diabetic chronic kidney disease: Secondary | ICD-10-CM | POA: Diagnosis not present

## 2021-03-19 DIAGNOSIS — G4733 Obstructive sleep apnea (adult) (pediatric): Secondary | ICD-10-CM | POA: Diagnosis not present

## 2021-03-22 ENCOUNTER — Inpatient Hospital Stay: Payer: Medicare HMO | Attending: Gynecologic Oncology | Admitting: Gynecologic Oncology

## 2021-03-22 ENCOUNTER — Other Ambulatory Visit: Payer: Self-pay

## 2021-03-22 VITALS — BP 142/62 | HR 60 | Temp 98.1°F | Resp 14 | Ht 61.42 in | Wt 181.0 lb

## 2021-03-22 DIAGNOSIS — R159 Full incontinence of feces: Secondary | ICD-10-CM | POA: Insufficient documentation

## 2021-03-22 DIAGNOSIS — Z8544 Personal history of malignant neoplasm of other female genital organs: Secondary | ICD-10-CM | POA: Diagnosis not present

## 2021-03-22 DIAGNOSIS — Z923 Personal history of irradiation: Secondary | ICD-10-CM | POA: Diagnosis not present

## 2021-03-22 DIAGNOSIS — R197 Diarrhea, unspecified: Secondary | ICD-10-CM | POA: Insufficient documentation

## 2021-03-22 DIAGNOSIS — C519 Malignant neoplasm of vulva, unspecified: Secondary | ICD-10-CM

## 2021-03-22 NOTE — Progress Notes (Signed)
Gynecologic Oncology Return Clinic Visit  03/22/2021  Reason for Visit: Follow-up in the setting of vulvar cancer  Treatment History: Oncology History  Vulvar cancer ([place])  07/20/2020 Initial Diagnosis   Vulvar cancer ([place])   07/26/2020 Surgery   EUA, vulvar biopsies  Findings: Pictures taken in media.  On exam, there is about a 4 x 3-1/2 cm area concerning for involvement by carcinoma that spans between the clitoris (replacing the bottom aspect of the clitoris) to just superior to the urethra.  This is a butterfly lesion that extends along the inner labia bilaterally.  Previous biopsy site is healing well with suture still intact.  There is a firmer and raised area that is approximately 1 cm around the area of the biopsy.  Biopsies taken circumferentially around the lesion noted as well as from the central aspect to help delineate what is cancer and what may be chronic dermatoses.  Given appearance today with patient more comfortable, I am suspicious that the entire lesion is carcinoma.   07/26/2020 Pathology Results   A. VULVA, 6 OCLOCK, INFERIOR ASPECT, BIOPSY:  - Superficial fragments of squamous cell carcinoma.   B. VULVA, 11 OCLOCK, BIOPSY:  - Invasive squamous cell carcinoma.  - No lymphovascular invasion.   C. VULVA, 2 OCLOCK, BIOPSY:  - Invasive squamous cell carcinoma.  - No lymphovascular invasion.   D. VULVA, CENTRAL, BIOPSY:  - Superficial fragment of squamous cell carcinoma.    08/06/2020 Imaging   PET: no evidence of metastatic disease   08/07/2020 Clinical Stage   Stage IB   08/22/2020 Surgery   Partial radical anterior vulvectomy with right inguinal SLN biopsy.  Findings: 4x4cm butterfly lesion on the anterior vulva, spanning from just superior to the clitoris to just superior to the urethral meatus. Lesion crossed the midline, more significant lesion on the right. LSG prior to surgery showed bilateral mapping. No hot or blue SLN identified on the left, mildly blue  and hot node identified as the SLN on the right. Decision made to abort left-sided LND given hypertension and difficulty ventilating. Additionally, given intra-op cardiac and respiratory status, decision made to proceed with a less radical resection (very close if not positive peri-urethral margin visibly.   08/22/2020 Pathology Results   Stage IB, lymph node assessment only on the right given comorbidities and cardiac issues during surgery  Tumor Focality:    Unifocal     Tumor Site:    Right vulva     Tumor Site:    Left vulva     Tumor Size:    Greatest Dimension (Centimeters): 4 cm     Histologic Type:    Squamous cell carcinoma, HPV-independent     Histologic Grade:    G3, poorly differentiated     Depth of Tumor Invasion:    6 mm     Tumor Border:    Infiltrating     Other Tissue / Organ Involvement:    Not applicable     Lymphovascular Invasion:    Not identified   MARGINS     Margin Status for Invasive Carcinoma:    All margins negative for invasive carcinoma       Closest Margin(s) to Invasive Carcinoma:    Peripheral: 12:00       Distance from Invasive Carcinoma to Closest Margin:    1 mm     Margin Status for HSIL (VIN2-3) or dVIN:    All margins negative for high-grade squamous intraepithelial lesion (HSIL) and / or differentiated  vulvar intraepithelial neoplasia (dVIN)   REGIONAL LYMPH NODES     Regional Lymph Node Status:           :    All regional lymph nodes negative for tumor cells       Total Number of Lymph Nodes Examined:    1         Nodal Site(s) Examined:    Right inguinal       Number of Sentinel Nodes Examined:    1    09/06/2020 Genetic Testing   Negative genetic testing:  No pathogenic variants detected on the [facility] Multi-Cancer + RNA panel. A variant of uncertain significance (VUS) was detected in the POLD1 gene called c.427G>A (p.Gly143Ser). The report date is 09/06/2020.  The Multi-Cancer + RNA Panel offered by [facility] includes sequencing and/or  deletion/duplication analysis of the following 84 genes:  AIP*, ALK, APC*, ATM*, AXIN2*, BAP1*, BARD1*, BLM*, BMPR1A*, BRCA1*, BRCA2*, BRIP1*, CASR, CDC73*, CDH1*, CDK4, CDKN1B*, CDKN1C*, CDKN2A, CEBPA, CHEK2*, CTNNA1*, DICER1*, DIS3L2*, EGFR, EPCAM, FH*, FLCN*, GATA2*, GPC3, GREM1, HOXB13, HRAS, KIT, MAX*, MEN1*, MET, MITF, MLH1*, MSH2*, MSH3*, MSH6*, MUTYH*, NBN*, NF1*, NF2*, NTHL1*, PALB2*, PDGFRA, PHOX2B, PMS2*, POLD1*, POLE*, POT1*, PRKAR1A*, PTCH1*, PTEN*, RAD50*, RAD51C*, RAD51D*, RB1*, RECQL4, RET, RUNX1*, SDHA*, SDHAF2*, SDHB*, SDHC*, SDHD*, SMAD4*, SMARCA4*, SMARCB1*, SMARCE1*, STK11*, SUFU*, TERC, TERT, TMEM127*, Tp53*, TSC1*, TSC2*, VHL*, WRN*, and WT1.  RNA analysis is performed for * genes.    10/23/2020 - 12/14/2020 Radiation Therapy   10/23/2020 through 12/14/2020 Site Technique Total Dose (Gy) Dose per Fx (Gy) Completed Fx Beam Energies  Vulva: Pelvis IMRT 50.4/50.4 1.8 28/28 6X  Vulva: Pelvis_Bst IMRT 9/9 1.8 5/5 6X         Interval History: She saw Dr. Sondra Come in early January for follow-up after completing radiation in December.  She reports overall doing very well.  She is feeling much better since finishing radiation.  She denies any vulvar burning, itching, or pain.  She denies any vaginal bleeding or discharge.  She reports regular bladder function.  She is struggling with some diarrhea since radiation.  She also describes some fecal incontinence where she will feel that she needs to go or sometimes does not feel that she needs to go and will have passage of small amount of stool or mucus.  Past Medical/Surgical History: Past Medical History:  Diagnosis Date   Arthritis    Diabetes mellitus without complication ([place])    Dyspnea    Encounter for care of pacemaker 09/03/2018   Family history of kidney cancer    Family history of throat cancer    Family history of uterine cancer    History of chickenpox    History of radiation therapy    Pelvis 10/23/20-12/14/20- Dr.  Gery Pray   Hx of psoriatic arthritis    Hyperlipemia    Hypertension    Hypertension 05/03/2018   Hypothyroidism    Mitral valve disorders(424.0)    Obstructive sleep apnea    Pacemaker    Paroxysmal atrial fibrillation ([place]) 10/11/2016   Peripheral neuropathy    Renal disorder Kidney disease stage2   Thyroid disease hypothyroid   Urine incontinence    Vitamin D deficiency     Past Surgical History:  Procedure Laterality Date   BACK SURGERY     BACK SURGERY     CARDIAC CATHETERIZATION N/A 04/29/2015   Procedure: Temporary Pacemaker;  Surgeon: Adrian Prows, MD;  Location: [facility];  Service: Cardiovascular;  Laterality: N/A;  CARDIOVERSION N/A 09/07/2018   Procedure: CARDIOVERSION;  Surgeon: Adrian Prows, MD;  Location: [facility];  Service: Cardiovascular;  Laterality: N/A;   CARDIOVERSION     COLONOSCOPY     EP IMPLANTABLE DEVICE N/A 04/30/2015   Procedure: Pacemaker Implant;  Surgeon: Evans Lance, MD;  Location: [facility];  Service: Cardiovascular;  Laterality: N/A;   INTRAVASCULAR PRESSURE WIRE/FFR STUDY N/A 08/31/2017   Procedure: INTRAVASCULAR PRESSURE WIRE/FFR STUDY;  Surgeon: Nigel Mormon, MD;  Location: [facility];  Service: Cardiovascular;  Laterality: N/A;   LEFT AND RIGHT HEART CATHETERIZATION WITH CORONARY ANGIOGRAM N/A 09/30/2011   Procedure: LEFT AND RIGHT HEART CATHETERIZATION WITH CORONARY ANGIOGRAM;  Surgeon: Laverda Page, MD;  Location: [facility];  Service: Cardiovascular;  Laterality: N/A;   RIGHT/LEFT HEART CATH AND CORONARY ANGIOGRAPHY N/A 08/31/2017   Procedure: RIGHT/LEFT HEART CATH AND CORONARY ANGIOGRAPHY;  Surgeon: Nigel Mormon, MD;  Location: [facility];  Service: Cardiovascular;  Laterality: N/A;   TOOTH EXTRACTION  10/07/2018   TUBAL LIGATION     VULVA Milagros Loll BIOPSY N/A 07/26/2020   Procedure: VULVAR BIOPSY;  Surgeon: Lafonda Mosses, MD;  Location: [facility];  Service: Gynecology;   Laterality: N/A;   YAG LASER APPLICATION Right 71/06/2692   Procedure: YAG LASER APPLICATION;  Surgeon: Elta Guadeloupe T. Gershon Crane, MD;  Location: [facility];  Service: Ophthalmology;  Laterality: Right;  pt knows to arrive at 85:46   YAG LASER APPLICATION Left 27/03/5007   Procedure: YAG LASER APPLICATION;  Surgeon: Rutherford Guys, MD;  Location: [facility];  Service: Ophthalmology;  Laterality: Left;    Family History  Problem Relation Age of Onset   Arthritis Mother    Diabetes Mother    Heart disease Mother    Hyperlipidemia Mother    Hypertension Mother    Arthritis Father    Asthma Father    Heart attack Father    Hyperlipidemia Father    Hypertension Father    Stroke Father    Arthritis Sister    Diabetes Sister    Hypertension Sister    Arthritis Sister    Diabetes Sister    Hyperlipidemia Sister    Hypertension Sister    Stroke Sister    Ovarian cancer Sister 32   Pancreatic cancer Sister 92   Uterine cancer Sister 75   Hyperlipidemia Sister    Hypertension Sister    Arthritis Brother    Heart attack Brother    Heart disease Brother    Hyperlipidemia Brother    Hypertension Brother    Alcohol abuse Brother    Arthritis Brother    Diabetes Brother    Early death Brother    Heart disease Brother    Hyperlipidemia Brother    Hypertension Brother    Cancer Maternal Aunt 70       unknown type   Throat cancer Maternal Grandfather        hx smoking/drinking   Hypertension Daughter    Cancer Daughter        "female cancer cells"   Kidney cancer Nephew        dx 53s   Colon cancer Neg Hx    Breast cancer Neg Hx    Prostate cancer Neg Hx     Social History   Socioeconomic History   Marital status: Widowed    Spouse name: Not on file   Number of children: 3   Years of education: Not on file   Highest  education level: Not on file  Occupational History   Not on file  Tobacco Use   Smoking status: Never   Smokeless tobacco: Never  Vaping Use   Vaping Use: Never used   Substance and Sexual Activity   Alcohol use: No    Alcohol/week: 0.0 standard drinks   Drug use: No   Sexual activity: Not Currently    Birth control/protection: Post-menopausal  Other Topics Concern   Not on file  Social History Narrative   Husband recently passed away on 05-02-20.   Social Determinants of Health   Financial Resource Strain: Not on file  Food Insecurity: Not on file  Transportation Needs: Not on file  Physical Activity: Not on file  Stress: Not on file  Social Connections: Not on file    Current Medications:  Current Outpatient Medications:    amiodarone (PACERONE) 200 MG tablet, Take 1/2 (one-half) tablet by mouth once daily (Patient taking differently: Take 100 mg by mouth daily.), Disp: 45 tablet, Rfl: 3   amLODipine (NORVASC) 5 MG tablet, , Disp: , Rfl:    Blood Glucose Monitoring Suppl (ACCU-CHEK AVIVA PLUS) w/Device KIT, , Disp: , Rfl:    ELIQUIS 5 MG TABS tablet, Take 1 tablet by mouth twice daily, Disp: 180 tablet, Rfl: 0   empagliflozin (JARDIANCE) 25 MG TABS tablet, Take 1 tablet (25 mg total) by mouth daily before breakfast., Disp: 30 tablet, Rfl: 6   EUTHYROX 88 MCG tablet, Take 88 mcg by mouth daily., Disp: , Rfl:    fenofibrate 160 MG tablet, Take 160 mg by mouth daily with supper. , Disp: , Rfl:    hydrocortisone (ANUSOL-HC) 25 MG suppository, Place 1 suppository (25 mg total) rectally 2 (two) times daily. (Patient not taking: Reported on 01/17/2021), Disp: 12 suppository, Rfl: 0   insulin aspart protamine- aspart (NOVOLOG MIX 70/30) (70-30) 100 UNIT/ML injection, Inject 35-45 Units into the skin See admin instructions. Inject 45 units into the skin with breakfast, 35 units with supper (may adjust based on blood sugar readings), Disp: , Rfl:    isosorbide mononitrate (IMDUR) 60 MG 24 hr tablet, Take 1 tablet by mouth once daily, Disp: 90 tablet, Rfl: 0   lidocaine (XYLOCAINE) 5 % ointment, Apply 1 application topically 3 (three) times daily  as needed. (Patient not taking: Reported on 01/17/2021), Disp: 35.44 g, Rfl: 2   metoprolol tartrate (LOPRESSOR) 50 MG tablet, Take 1 tablet by mouth twice daily, Disp: 180 tablet, Rfl: 0   nitroGLYCERIN (NITROSTAT) 0.4 MG SL tablet, Place 1 tablet (0.4 mg total) under the tongue every 5 (five) minutes as needed for chest pain. (Patient not taking: Reported on 01/17/2021), Disp: 25 tablet, Rfl: 1   olmesartan-hydrochlorothiazide (BENICAR HCT) 40-25 MG tablet, Take 1 tablet by mouth daily., Disp: 90 tablet, Rfl: 3   spironolactone (ALDACTONE) 25 MG tablet, Take 1 tablet by mouth once daily, Disp: 90 tablet, Rfl: 0   SURE COMFORT INSULIN SYRINGE 31G X 5/16" 0.5 ML MISC, , Disp: , Rfl:    torsemide (DEMADEX) 10 MG tablet, Take 1 tablet (10 mg total) by mouth 2 (two) times daily. (Patient not taking: Reported on 01/17/2021), Disp: 30 tablet, Rfl: 3   traMADol (ULTRAM) 50 MG tablet, Take 1 tablet (50 mg total) by mouth every 6 (six) hours as needed for severe pain. Do not take and drive (Patient not taking: Reported on 01/17/2021), Disp: 30 tablet, Rfl: 0  Review of Systems: + Diarrhea Denies appetite changes, fevers, chills, fatigue,  unexplained weight changes. Denies hearing loss, neck lumps or masses, mouth sores, ringing in ears or voice changes. Denies cough or wheezing.  Denies shortness of breath. Denies chest pain or palpitations. Denies leg swelling. Denies abdominal distention, pain, blood in stools, constipation, nausea, vomiting, or early satiety. Denies pain with intercourse, dysuria, frequency, hematuria or incontinence. Denies hot flashes, pelvic pain, vaginal bleeding or vaginal discharge.   Denies joint pain, back pain or muscle pain/cramps. Denies itching, rash, or wounds. Denies dizziness, headaches, numbness or seizures. Denies swollen lymph nodes or glands, denies easy bruising or bleeding. Denies anxiety, depression, confusion, or decreased concentration.  Physical Exam: BP (!)  142/62 (BP Location: Left Arm, Patient Position: Sitting)    Pulse 60    Temp 98.1 F (36.7 C) (Oral)    Resp 14    Ht 5' 1.42" (1.56 m)    Wt 181 lb (82.1 kg)    SpO2 96%    BMI 33.74 kg/m  General: Alert, oriented, no acute distress. HEENT: Normocephalic, atraumatic, sclera anicteric. Chest: Clear to auscultation bilaterally.  No wheezes or rhonchi. Abdomen: Obese, soft, nontender.  Normoactive bowel sounds.  No masses or hepatosplenomegaly appreciated.   Extremities: Grossly normal range of motion.  Warm, well perfused.  No edema bilaterally. Skin: No rashes or lesions noted. Lymphatics: No cervical, supraclavicular, or inguinal adenopathy. GU: External female genitalia atrophic with radiation changes present as well as healing from prior surgery.  No inguinal adenopathy appreciated. Vulvoscopy performed, radiation changes evident as well as atrophy.  No lesions or atypical vascularity.  No acetowhite changes.  Speculum exam performed with atrophic vaginal mucosa, no lesions noted.  Laboratory & Radiologic Studies: None new  Assessment & Plan: [name] is a 76 y.o. woman with at least Stage IB grade 3 SCC of the vulva, HPV - independent, who presents for surveillance after completing adjuvant radiation in 12/2020.  Patient is overall doing very well and is NED on exam today.  She has recovered from most of her side effects during radiation although continues to have some diarrhea as well as fecal incontinence.  We discussed today trying to add a fiber supplement or Metamucil to her diet.  She will call and let me know if this does not help improve her symptoms.   Per NCCN surveillance recommendations, we will continue with visits every 3 months.  She prefers to have this done slowly in our clinic.  We reviewed signs and symptoms that would be concerning for disease recurrence, and I have stressed the importance of her calling if she develops any of these between visits.  32 minutes of  total time was spent for this patient encounter, including preparation, face-to-face counseling with the patient and coordination of care, and documentation of the encounter.  Jeral Pinch, MD  Division of Gynecologic Oncology  Department of Obstetrics and Gynecology  [facility]

## 2021-03-22 NOTE — Patient Instructions (Addendum)
It was good to see you today.  I do not see or feel any evidence of cancer recurrence on your exam.  We will continue with visits every 3 months.  If you develop any new and concerning symptoms between now and your next visit, please call to see me sooner. ? ?Please try to add some fiber or Metamucil to see if this helps with your bowel function.  If it does not, please call the clinic to let me know. ?

## 2021-03-31 ENCOUNTER — Other Ambulatory Visit: Payer: Self-pay | Admitting: Cardiology

## 2021-03-31 DIAGNOSIS — I2510 Atherosclerotic heart disease of native coronary artery without angina pectoris: Secondary | ICD-10-CM

## 2021-03-31 DIAGNOSIS — I10 Essential (primary) hypertension: Secondary | ICD-10-CM

## 2021-04-01 DIAGNOSIS — R069 Unspecified abnormalities of breathing: Secondary | ICD-10-CM | POA: Diagnosis not present

## 2021-04-01 DIAGNOSIS — I5033 Acute on chronic diastolic (congestive) heart failure: Secondary | ICD-10-CM | POA: Diagnosis not present

## 2021-04-17 ENCOUNTER — Telehealth: Payer: Self-pay | Admitting: Internal Medicine

## 2021-04-17 NOTE — Telephone Encounter (Signed)
If doing fine and doesn't feel further w/u needed that's fine to cancel f/u and pfts and copy to PCP to let him know pt elected not to f/u with this office. ?

## 2021-04-17 NOTE — Telephone Encounter (Signed)
Tonya called patient to schedule an appt but she didn't think the appt was necessary. I wasn't sure if you wanted to still put in an order for the PFT just in case she calls back to make one or hold off for now? Below is what was in the AVS. Please advise ? ?Instructions ? ?Make sure you check your oxygen saturation at your highest level of activity to be sure it stays over 90% and keep track of it at least once a week, more often if breathing getting worse, and let me know if losing ground.  ?  ?We will walk you today for a baseline at your pace  ?  ?Please schedule a follow up visit in 6  months but call sooner if needed with pfts same day   ?  ? ?

## 2021-04-19 DIAGNOSIS — G4733 Obstructive sleep apnea (adult) (pediatric): Secondary | ICD-10-CM | POA: Diagnosis not present

## 2021-04-19 NOTE — Telephone Encounter (Signed)
Noted! Patient will reach out when appt needed. Will close encounter ?

## 2021-05-01 DIAGNOSIS — N1831 Chronic kidney disease, stage 3a: Secondary | ICD-10-CM | POA: Diagnosis not present

## 2021-05-01 DIAGNOSIS — E039 Hypothyroidism, unspecified: Secondary | ICD-10-CM | POA: Diagnosis not present

## 2021-05-01 DIAGNOSIS — E559 Vitamin D deficiency, unspecified: Secondary | ICD-10-CM | POA: Diagnosis not present

## 2021-05-01 DIAGNOSIS — I129 Hypertensive chronic kidney disease with stage 1 through stage 4 chronic kidney disease, or unspecified chronic kidney disease: Secondary | ICD-10-CM | POA: Diagnosis not present

## 2021-05-01 DIAGNOSIS — E1122 Type 2 diabetes mellitus with diabetic chronic kidney disease: Secondary | ICD-10-CM | POA: Diagnosis not present

## 2021-05-01 DIAGNOSIS — E785 Hyperlipidemia, unspecified: Secondary | ICD-10-CM | POA: Diagnosis not present

## 2021-05-02 DIAGNOSIS — I5033 Acute on chronic diastolic (congestive) heart failure: Secondary | ICD-10-CM | POA: Diagnosis not present

## 2021-05-02 DIAGNOSIS — R069 Unspecified abnormalities of breathing: Secondary | ICD-10-CM | POA: Diagnosis not present

## 2021-05-12 DIAGNOSIS — N183 Chronic kidney disease, stage 3 unspecified: Secondary | ICD-10-CM | POA: Diagnosis not present

## 2021-05-12 DIAGNOSIS — I5032 Chronic diastolic (congestive) heart failure: Secondary | ICD-10-CM | POA: Diagnosis not present

## 2021-05-12 DIAGNOSIS — I130 Hypertensive heart and chronic kidney disease with heart failure and stage 1 through stage 4 chronic kidney disease, or unspecified chronic kidney disease: Secondary | ICD-10-CM | POA: Diagnosis not present

## 2021-05-12 DIAGNOSIS — E1122 Type 2 diabetes mellitus with diabetic chronic kidney disease: Secondary | ICD-10-CM | POA: Diagnosis not present

## 2021-05-14 IMAGING — CR CHEST - 2 VIEW
2 series · 2 of 2 positions shown · non-contrast
Comparison: Chest radiographs 10/24/2017, 06/07/2017

CLINICAL DATA: SOB, non smoker, pacemaker

EXAM:
CHEST - 2 VIEW

[w chest pa]
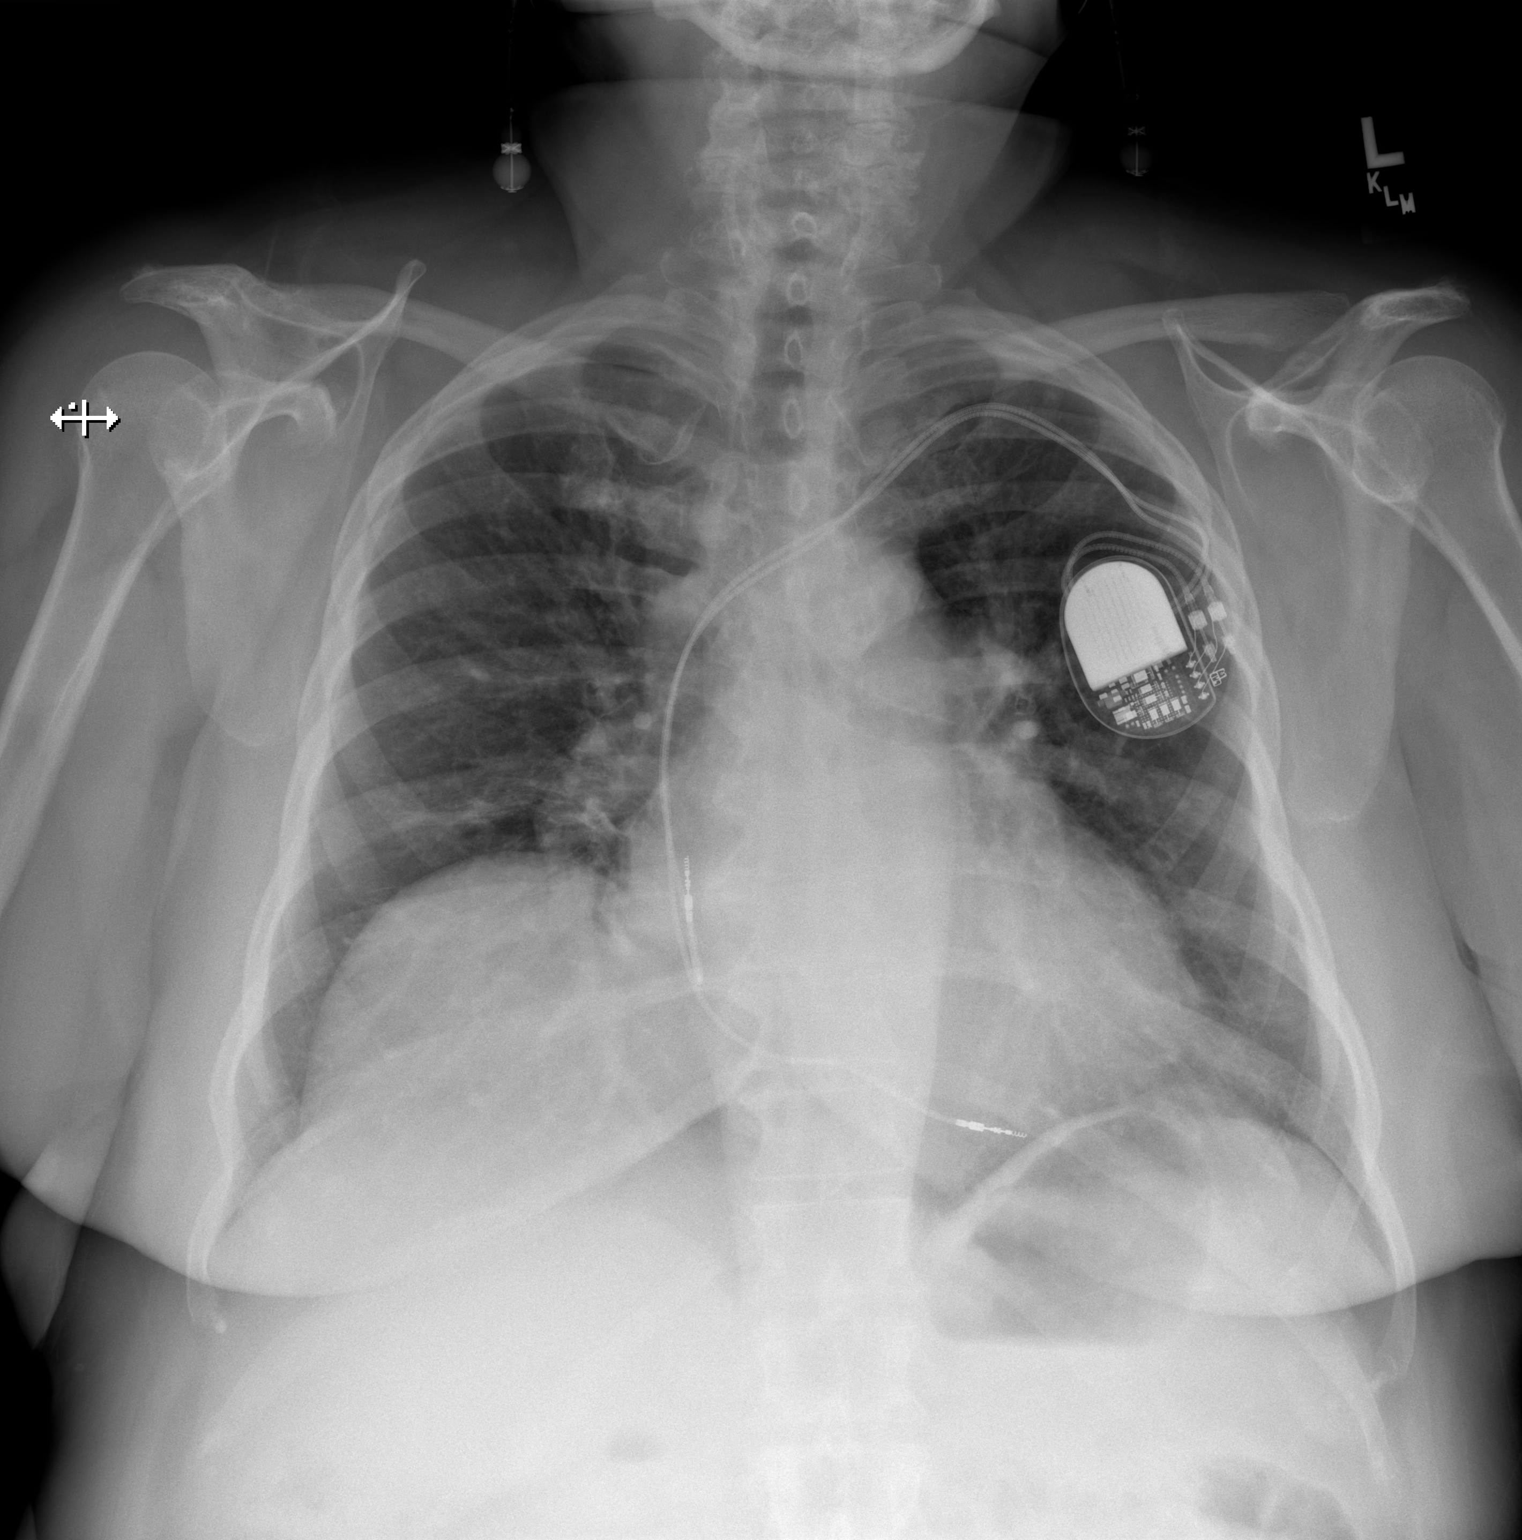

[w chest lat]
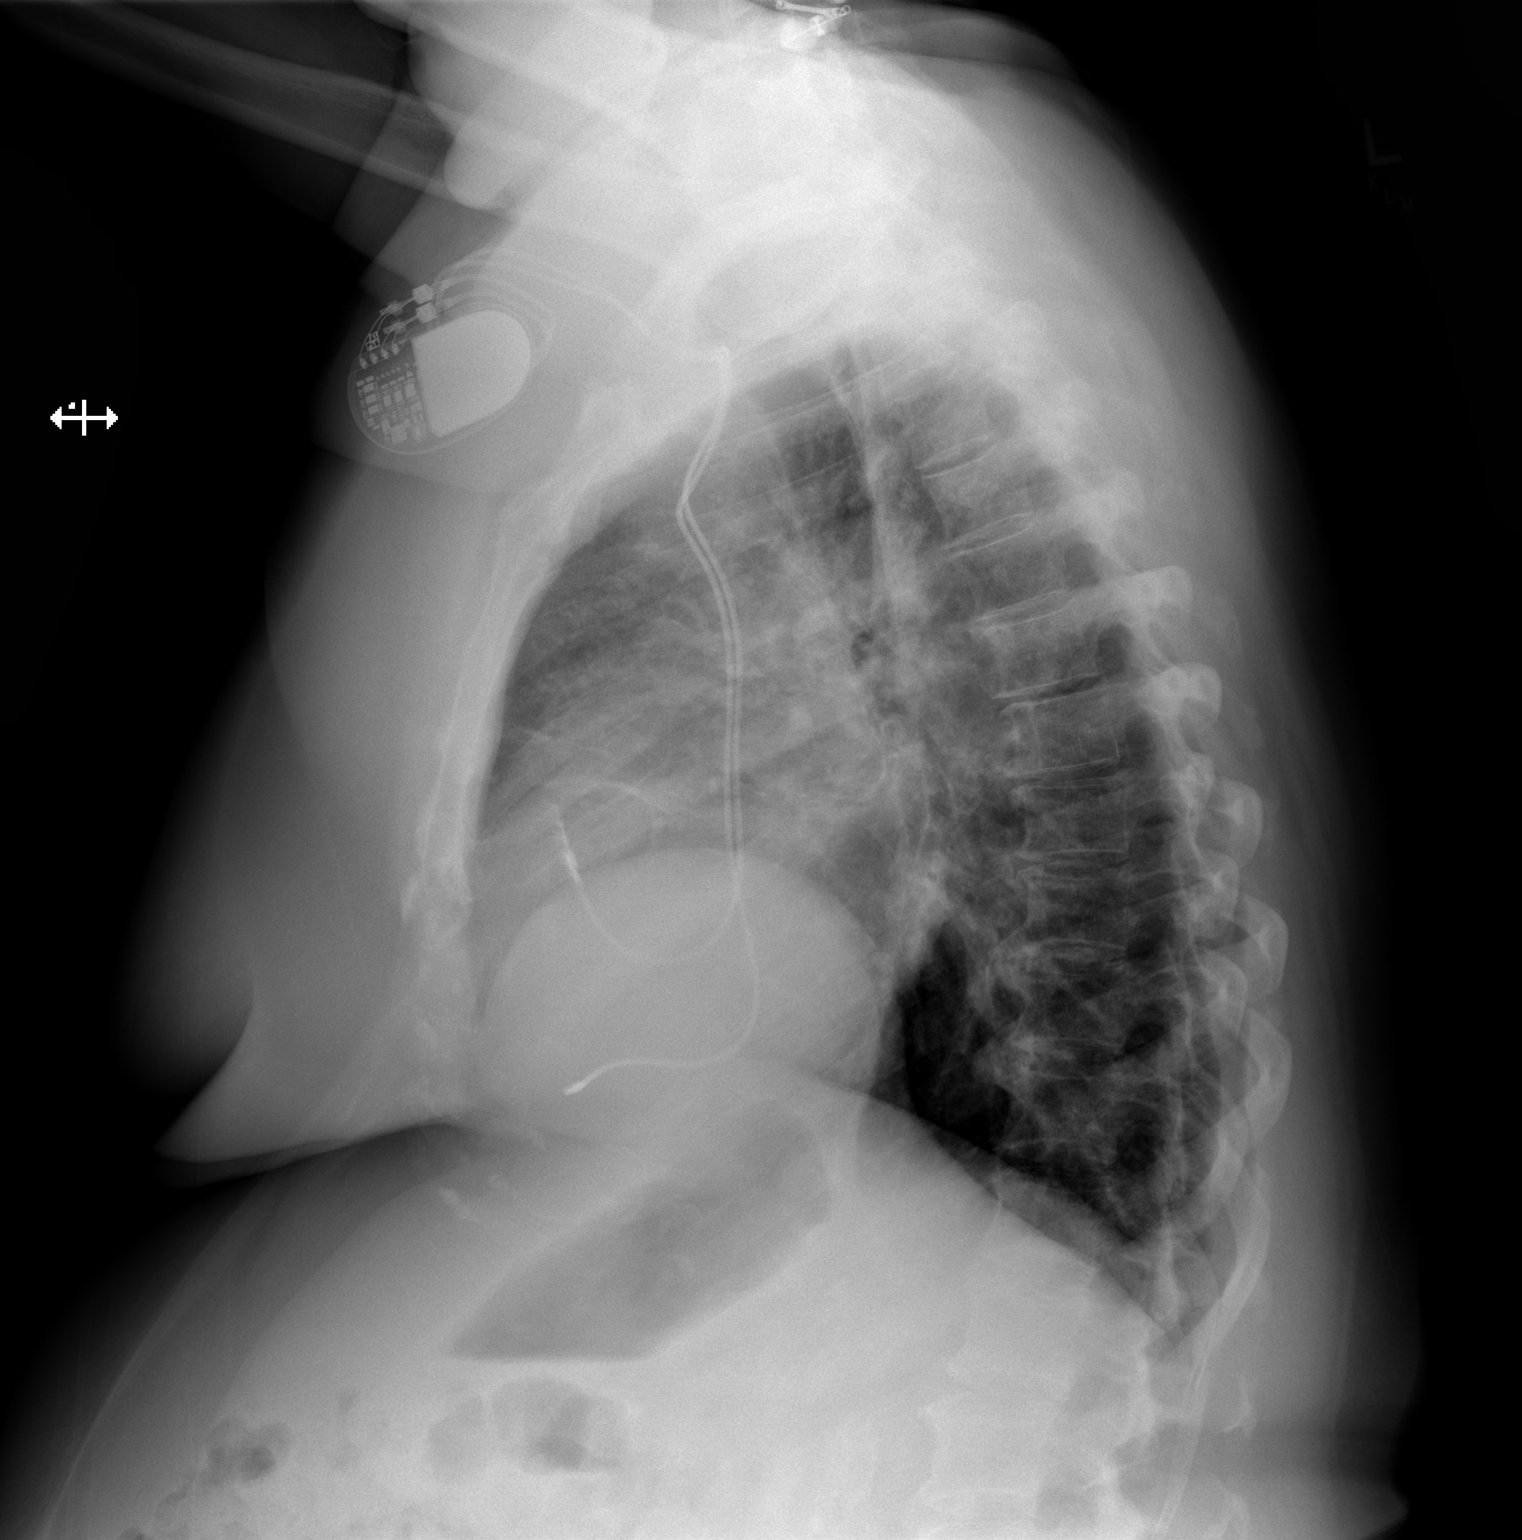

[2 of 2 positions shown; findings below may reference images not displayed]

FINDINGS: Stable cardiomediastinal contours with enlarged heart size. Dual
lead left chest pacemaker with tip terminating in the right atrium
and right ventricle. There is central venous congestion without
overt edema. No new focal infiltrate. No pneumothorax or pleural
effusion. Degenerative changes are seen in the thoracic spine.
IMPRESSION: Cardiomegaly with central venous congestion, no overt edema.

## 2021-05-19 DIAGNOSIS — G4733 Obstructive sleep apnea (adult) (pediatric): Secondary | ICD-10-CM | POA: Diagnosis not present

## 2021-05-28 ENCOUNTER — Other Ambulatory Visit: Payer: Self-pay | Admitting: Cardiology

## 2021-05-28 DIAGNOSIS — I5032 Chronic diastolic (congestive) heart failure: Secondary | ICD-10-CM

## 2021-05-28 NOTE — Telephone Encounter (Signed)
Okay to refill pts amiodarone?

## 2021-06-01 DIAGNOSIS — R069 Unspecified abnormalities of breathing: Secondary | ICD-10-CM | POA: Diagnosis not present

## 2021-06-01 DIAGNOSIS — I5033 Acute on chronic diastolic (congestive) heart failure: Secondary | ICD-10-CM | POA: Diagnosis not present

## 2021-06-19 DIAGNOSIS — G4733 Obstructive sleep apnea (adult) (pediatric): Secondary | ICD-10-CM | POA: Diagnosis not present

## 2021-06-20 DIAGNOSIS — E1122 Type 2 diabetes mellitus with diabetic chronic kidney disease: Secondary | ICD-10-CM | POA: Diagnosis not present

## 2021-06-20 DIAGNOSIS — I129 Hypertensive chronic kidney disease with stage 1 through stage 4 chronic kidney disease, or unspecified chronic kidney disease: Secondary | ICD-10-CM | POA: Diagnosis not present

## 2021-06-20 DIAGNOSIS — E1165 Type 2 diabetes mellitus with hyperglycemia: Secondary | ICD-10-CM | POA: Diagnosis not present

## 2021-06-20 DIAGNOSIS — E039 Hypothyroidism, unspecified: Secondary | ICD-10-CM | POA: Diagnosis not present

## 2021-06-20 DIAGNOSIS — E785 Hyperlipidemia, unspecified: Secondary | ICD-10-CM | POA: Diagnosis not present

## 2021-06-21 ENCOUNTER — Other Ambulatory Visit: Payer: Self-pay

## 2021-06-21 ENCOUNTER — Inpatient Hospital Stay: Payer: Medicare HMO | Attending: Gynecologic Oncology | Admitting: Gynecologic Oncology

## 2021-06-21 ENCOUNTER — Encounter: Payer: Self-pay | Admitting: Gynecologic Oncology

## 2021-06-21 VITALS — BP 150/51 | HR 60 | Temp 98.3°F | Resp 16 | Wt 188.2 lb

## 2021-06-21 DIAGNOSIS — N9089 Other specified noninflammatory disorders of vulva and perineum: Secondary | ICD-10-CM | POA: Diagnosis not present

## 2021-06-21 DIAGNOSIS — Z923 Personal history of irradiation: Secondary | ICD-10-CM | POA: Diagnosis not present

## 2021-06-21 DIAGNOSIS — Z08 Encounter for follow-up examination after completed treatment for malignant neoplasm: Secondary | ICD-10-CM

## 2021-06-21 DIAGNOSIS — Z9079 Acquired absence of other genital organ(s): Secondary | ICD-10-CM | POA: Insufficient documentation

## 2021-06-21 DIAGNOSIS — L900 Lichen sclerosus et atrophicus: Secondary | ICD-10-CM | POA: Diagnosis not present

## 2021-06-21 DIAGNOSIS — Z8544 Personal history of malignant neoplasm of other female genital organs: Secondary | ICD-10-CM

## 2021-06-21 DIAGNOSIS — C519 Malignant neoplasm of vulva, unspecified: Secondary | ICD-10-CM | POA: Diagnosis not present

## 2021-06-21 DIAGNOSIS — N762 Acute vulvitis: Secondary | ICD-10-CM | POA: Diagnosis not present

## 2021-06-21 MED ORDER — CLOBETASOL PROPIONATE 0.05 % EX OINT: 1.0000 "application " | TOPICAL_OINTMENT | CUTANEOUS | 0 refills | Status: DC

## 2021-06-21 NOTE — Progress Notes (Signed)
Gynecologic Oncology Return Clinic Visit  06/21/21  Reason for Visit: Follow-up in the setting of vulvar cancer  Treatment History: Oncology History  Vulvar cancer ([place])  07/20/2020 Initial Diagnosis   Vulvar cancer ([place])   07/26/2020 Surgery   EUA, vulvar biopsies  Findings: Pictures taken in media.  On exam, there is about a 4 x 3-1/2 cm area concerning for involvement by carcinoma that spans between the clitoris (replacing the bottom aspect of the clitoris) to just superior to the urethra.  This is a butterfly lesion that extends along the inner labia bilaterally.  Previous biopsy site is healing well with suture still intact.  There is a firmer and raised area that is approximately 1 cm around the area of the biopsy.  Biopsies taken circumferentially around the lesion noted as well as from the central aspect to help delineate what is cancer and what may be chronic dermatoses.  Given appearance today with patient more comfortable, I am suspicious that the entire lesion is carcinoma.   07/26/2020 Pathology Results   A. VULVA, 6 OCLOCK, INFERIOR ASPECT, BIOPSY:  - Superficial fragments of squamous cell carcinoma.   B. VULVA, 11 OCLOCK, BIOPSY:  - Invasive squamous cell carcinoma.  - No lymphovascular invasion.   C. VULVA, 2 OCLOCK, BIOPSY:  - Invasive squamous cell carcinoma.  - No lymphovascular invasion.   D. VULVA, CENTRAL, BIOPSY:  - Superficial fragment of squamous cell carcinoma.    08/06/2020 Imaging   PET: no evidence of metastatic disease   08/07/2020 Clinical Stage   Stage IB   08/22/2020 Surgery   Partial radical anterior vulvectomy with right inguinal SLN biopsy.  Findings: 4x4cm butterfly lesion on the anterior vulva, spanning from just superior to the clitoris to just superior to the urethral meatus. Lesion crossed the midline, more significant lesion on the right. LSG prior to surgery showed bilateral mapping. No hot or blue SLN identified on the left, mildly blue and  hot node identified as the SLN on the right. Decision made to abort left-sided LND given hypertension and difficulty ventilating. Additionally, given intra-op cardiac and respiratory status, decision made to proceed with a less radical resection (very close if not positive peri-urethral margin visibly.   08/22/2020 Pathology Results   Stage IB, lymph node assessment only on the right given comorbidities and cardiac issues during surgery  Tumor Focality:    Unifocal     Tumor Site:    Right vulva     Tumor Site:    Left vulva     Tumor Size:    Greatest Dimension (Centimeters): 4 cm     Histologic Type:    Squamous cell carcinoma, HPV-independent     Histologic Grade:    G3, poorly differentiated     Depth of Tumor Invasion:    6 mm     Tumor Border:    Infiltrating     Other Tissue / Organ Involvement:    Not applicable     Lymphovascular Invasion:    Not identified   MARGINS     Margin Status for Invasive Carcinoma:    All margins negative for invasive carcinoma       Closest Margin(s) to Invasive Carcinoma:    Peripheral: 12:00       Distance from Invasive Carcinoma to Closest Margin:    1 mm     Margin Status for HSIL (VIN2-3) or dVIN:    All margins negative for high-grade squamous intraepithelial lesion (HSIL) and / or differentiated  vulvar intraepithelial neoplasia (dVIN)   REGIONAL LYMPH NODES     Regional Lymph Node Status:           :    All regional lymph nodes negative for tumor cells       Total Number of Lymph Nodes Examined:    1         Nodal Site(s) Examined:    Right inguinal       Number of Sentinel Nodes Examined:    1    09/06/2020 Genetic Testing   Negative genetic testing:  No pathogenic variants detected on the [facility] Multi-Cancer + RNA panel. A variant of uncertain significance (VUS) was detected in the POLD1 gene called c.427G>A (p.Gly143Ser). The report date is 09/06/2020.  The Multi-Cancer + RNA Panel offered by [facility] includes sequencing and/or  deletion/duplication analysis of the following 84 genes:  AIP*, ALK, APC*, ATM*, AXIN2*, BAP1*, BARD1*, BLM*, BMPR1A*, BRCA1*, BRCA2*, BRIP1*, CASR, CDC73*, CDH1*, CDK4, CDKN1B*, CDKN1C*, CDKN2A, CEBPA, CHEK2*, CTNNA1*, DICER1*, DIS3L2*, EGFR, EPCAM, FH*, FLCN*, GATA2*, GPC3, GREM1, HOXB13, HRAS, KIT, MAX*, MEN1*, MET, MITF, MLH1*, MSH2*, MSH3*, MSH6*, MUTYH*, NBN*, NF1*, NF2*, NTHL1*, PALB2*, PDGFRA, PHOX2B, PMS2*, POLD1*, POLE*, POT1*, PRKAR1A*, PTCH1*, PTEN*, RAD50*, RAD51C*, RAD51D*, RB1*, RECQL4, RET, RUNX1*, SDHA*, SDHAF2*, SDHB*, SDHC*, SDHD*, SMAD4*, SMARCA4*, SMARCB1*, SMARCE1*, STK11*, SUFU*, TERC, TERT, TMEM127*, Tp53*, TSC1*, TSC2*, VHL*, WRN*, and WT1.  RNA analysis is performed for * genes.    10/23/2020 - 12/14/2020 Radiation Therapy   10/23/2020 through 12/14/2020 Site Technique Total Dose (Gy) Dose per Fx (Gy) Completed Fx Beam Energies  Vulva: Pelvis IMRT 50.4/50.4 1.8 28/28 6X  Vulva: Pelvis_Bst IMRT 9/9 1.8 5/5 6X         Interval History: Patient reports overall doing well.  She denies any vaginal bleeding or discharge.  She reports having only a couple brief episodes of vulvar pruritus since her last visit with me.  These resolve very quickly.  Reports that bowel function has significantly improved with the use of Metamucil.  She now does not have fecal incontinence but does still pass a small amount of mucus.  She reports regular bladder function although has some urinary incontinence.  Denies any pelvic pain.  Past Medical/Surgical History: Past Medical History:  Diagnosis Date   Arthritis    Diabetes mellitus without complication ([place])    Dyspnea    Encounter for care of pacemaker 09/03/2018   Family history of kidney cancer    Family history of throat cancer    Family history of uterine cancer    History of chickenpox    History of radiation therapy    Pelvis 10/23/20-12/14/20- Dr. Gery Pray   Hx of psoriatic arthritis    Hyperlipemia    Hypertension     Hypertension 05/03/2018   Hypothyroidism    Mitral valve disorders(424.0)    Obstructive sleep apnea    Pacemaker    Paroxysmal atrial fibrillation ([place]) 10/11/2016   Peripheral neuropathy    Renal disorder Kidney disease stage2   Thyroid disease hypothyroid   Urine incontinence    Vitamin D deficiency     Past Surgical History:  Procedure Laterality Date   BACK SURGERY     BACK SURGERY     CARDIAC CATHETERIZATION N/A 04/29/2015   Procedure: Temporary Pacemaker;  Surgeon: Adrian Prows, MD;  Location: [facility];  Service: Cardiovascular;  Laterality: N/A;   CARDIOVERSION N/A 09/07/2018   Procedure: CARDIOVERSION;  Surgeon: Adrian Prows, MD;  Location: [facility];  Service: Cardiovascular;  Laterality: N/A;   CARDIOVERSION     COLONOSCOPY     EP IMPLANTABLE DEVICE N/A 04/30/2015   Procedure: Pacemaker Implant;  Surgeon: Evans Lance, MD;  Location: [facility];  Service: Cardiovascular;  Laterality: N/A;   INTRAVASCULAR PRESSURE WIRE/FFR STUDY N/A 08/31/2017   Procedure: INTRAVASCULAR PRESSURE WIRE/FFR STUDY;  Surgeon: Nigel Mormon, MD;  Location: [facility];  Service: Cardiovascular;  Laterality: N/A;   LEFT AND RIGHT HEART CATHETERIZATION WITH CORONARY ANGIOGRAM N/A 09/30/2011   Procedure: LEFT AND RIGHT HEART CATHETERIZATION WITH CORONARY ANGIOGRAM;  Surgeon: Laverda Page, MD;  Location: [facility];  Service: Cardiovascular;  Laterality: N/A;   RIGHT/LEFT HEART CATH AND CORONARY ANGIOGRAPHY N/A 08/31/2017   Procedure: RIGHT/LEFT HEART CATH AND CORONARY ANGIOGRAPHY;  Surgeon: Nigel Mormon, MD;  Location: [facility];  Service: Cardiovascular;  Laterality: N/A;   TOOTH EXTRACTION  10/07/2018   TUBAL LIGATION     VULVA Milagros Loll BIOPSY N/A 07/26/2020   Procedure: VULVAR BIOPSY;  Surgeon: Lafonda Mosses, MD;  Location: [facility];  Service: Gynecology;  Laterality: N/A;   YAG LASER APPLICATION Right 19/75/8832   Procedure: YAG LASER  APPLICATION;  Surgeon: Elta Guadeloupe T. Gershon Crane, MD;  Location: [facility];  Service: Ophthalmology;  Laterality: Right;  pt knows to arrive at 54:98   YAG LASER APPLICATION Left 26/41/5830   Procedure: YAG LASER APPLICATION;  Surgeon: Rutherford Guys, MD;  Location: [facility];  Service: Ophthalmology;  Laterality: Left;    Family History  Problem Relation Age of Onset   Arthritis Mother    Diabetes Mother    Heart disease Mother    Hyperlipidemia Mother    Hypertension Mother    Arthritis Father    Asthma Father    Heart attack Father    Hyperlipidemia Father    Hypertension Father    Stroke Father    Arthritis Sister    Diabetes Sister    Hypertension Sister    Arthritis Sister    Diabetes Sister    Hyperlipidemia Sister    Hypertension Sister    Stroke Sister    Ovarian cancer Sister 58   Pancreatic cancer Sister 25   Uterine cancer Sister 68   Hyperlipidemia Sister    Hypertension Sister    Arthritis Brother    Heart attack Brother    Heart disease Brother    Hyperlipidemia Brother    Hypertension Brother    Alcohol abuse Brother    Arthritis Brother    Diabetes Brother    Early death Brother    Heart disease Brother    Hyperlipidemia Brother    Hypertension Brother    Cancer Maternal Aunt 70       unknown type   Throat cancer Maternal Grandfather        hx smoking/drinking   Hypertension Daughter    Cancer Daughter        "female cancer cells"   Kidney cancer Nephew        dx 79s   Colon cancer Neg Hx    Breast cancer Neg Hx    Prostate cancer Neg Hx     Social History   Socioeconomic History   Marital status: Widowed    Spouse name: Not on file   Number of children: 3   Years of education: Not on file   Highest education level: Not on file  Occupational History   Not on file  Tobacco Use  Smoking status: Never   Smokeless tobacco: Never  Vaping Use   Vaping Use: Never used  Substance and Sexual Activity   Alcohol use: No    Alcohol/week: 0.0 standard  drinks of alcohol   Drug use: No   Sexual activity: Not Currently    Birth control/protection: Post-menopausal  Other Topics Concern   Not on file  Social History Narrative   Husband recently passed away on 05-23-2020.   Social Determinants of Health   Financial Resource Strain: Not on file  Food Insecurity: Not on file  Transportation Needs: Not on file  Physical Activity: Not on file  Stress: Not on file  Social Connections: Not on file    Current Medications:  Current Outpatient Medications:    amiodarone (PACERONE) 200 MG tablet, Take 1/2 (one-half) tablet by mouth once daily, Disp: 45 tablet, Rfl: 3   amLODipine (NORVASC) 5 MG tablet, , Disp: , Rfl:    Blood Glucose Monitoring Suppl (ACCU-CHEK AVIVA PLUS) w/Device KIT, , Disp: , Rfl:    [START ON 06/24/2021] clobetasol ointment (TEMOVATE) 3.41 %, Apply 1 application  topically 2 (two) times a week. ONLY as needed for itching on your vulva, Disp: 30 g, Rfl: 0   ELIQUIS 5 MG TABS tablet, Take 1 tablet by mouth twice daily, Disp: 180 tablet, Rfl: 0   EUTHYROX 88 MCG tablet, Take 88 mcg by mouth daily., Disp: , Rfl:    fenofibrate 160 MG tablet, Take 160 mg by mouth daily with supper. , Disp: , Rfl:    hydrocortisone (ANUSOL-HC) 25 MG suppository, Place 1 suppository (25 mg total) rectally 2 (two) times daily., Disp: 12 suppository, Rfl: 0   insulin aspart protamine- aspart (NOVOLOG MIX 70/30) (70-30) 100 UNIT/ML injection, Inject 35-45 Units into the skin See admin instructions. Inject 45 units into the skin with breakfast, 35 units with supper (may adjust based on blood sugar readings), Disp: , Rfl:    isosorbide mononitrate (IMDUR) 60 MG 24 hr tablet, Take 1 tablet by mouth once daily, Disp: 90 tablet, Rfl: 0   lidocaine (XYLOCAINE) 5 % ointment, Apply 1 application topically 3 (three) times daily as needed., Disp: 35.44 g, Rfl: 2   metoprolol tartrate (LOPRESSOR) 50 MG tablet, Take 1 tablet by mouth twice daily, Disp: 180  tablet, Rfl: 0   nitroGLYCERIN (NITROSTAT) 0.4 MG SL tablet, Place 1 tablet (0.4 mg total) under the tongue every 5 (five) minutes as needed for chest pain., Disp: 25 tablet, Rfl: 1   olmesartan-hydrochlorothiazide (BENICAR HCT) 40-25 MG tablet, Take 1 tablet by mouth daily., Disp: 90 tablet, Rfl: 3   spironolactone (ALDACTONE) 25 MG tablet, Take 1 tablet by mouth once daily, Disp: 90 tablet, Rfl: 3   SURE COMFORT INSULIN SYRINGE 31G X 5/16" 0.5 ML MISC, , Disp: , Rfl:    torsemide (DEMADEX) 10 MG tablet, Take 1 tablet (10 mg total) by mouth 2 (two) times daily., Disp: 30 tablet, Rfl: 3   traMADol (ULTRAM) 50 MG tablet, Take 1 tablet (50 mg total) by mouth every 6 (six) hours as needed for severe pain. Do not take and drive, Disp: 30 tablet, Rfl: 0  Review of Systems: + incontinence, muscle cramp, joint pain Denies appetite changes, fevers, chills, fatigue, unexplained weight changes. Denies hearing loss, neck lumps or masses, mouth sores, ringing in ears or voice changes. Denies cough or wheezing.  Denies shortness of breath. Denies chest pain or palpitations. Denies leg swelling. Denies abdominal distention, pain, blood in stools,  constipation, diarrhea, nausea, vomiting, or early satiety. Denies pain with intercourse, dysuria, frequency, hematuria. Denies hot flashes, pelvic pain, vaginal bleeding or vaginal discharge.   Denies itching, rash, or wounds. Denies dizziness, headaches, numbness or seizures. Denies swollen lymph nodes or glands, denies easy bruising or bleeding. Denies anxiety, depression, confusion, or decreased concentration.  Physical Exam: BP (!) 150/51 (BP Location: Left Arm, Patient Position: Sitting)   Pulse 60   Temp 98.3 F (36.8 C) (Tympanic)   Resp 16   Wt 188 lb 4 oz (85.4 kg)   SpO2 91%   BMI 35.09 kg/m  General: Alert, oriented, no acute distress. HEENT: Normocephalic, atraumatic, sclera anicteric. Chest: Unlabored breathing on room air. Abdomen: Obese,  soft, nontender.  Normoactive bowel sounds.  No masses or hepatosplenomegaly appreciated.   Extremities: Grossly normal range of motion.  Warm, well perfused.  No edema bilaterally. Lymphatics: No cervical, supraclavicular, or inguinal adenopathy. GU: External female genitalia atrophic with radiation changes present .  No inguinal adenopathy appreciated.  There are 2 small areas of ulceration as well as mild leukoplakia along the midline prior healed incision.  No other areas of leukoplakia, ulceration.  No firmness or nodularity.  Vulvar biopsy Preoperative diagnosis: Vulvar lesion Postoperative diagnosis: Same as above Specimen: Anterior vulvar biopsy Physician: Berline Lopes MD Blood loss: 25 cc Procedure: After the procedure was discussed with the patient she gave verbal consent.  She was already in dorsolithotomy position.  The area to be biopsied was identified and cleansed with Betadine.  Approximately 1.5 cc of 2% lidocaine was then injected for local anesthesia.  After adequate time was given for anesthetic effect, a 3 mm punch biopsy was taken and placed in formalin.  Hemostasis was attempted with silver nitrate and pressure.  Given some continued oozing, 2 interrupted sutures using 4-0 Monocryl were used to achieve hemostasis.  Overall the patient tolerated the procedure well.  Laboratory & Radiologic Studies: None new  Assessment & Plan: [name] is a 76 y.o. woman with at least Stage IB grade 3 SCC of the vulva, HPV - independent, who presents for surveillance after completing adjuvant radiation in 12/2020.   Patient is overall doing very well.  Exam is overall reassuring.  There are some changes along the prior healed incision that I suspect are either related to edema after radiation or lichen sclerosis.  Biopsy taken today.  I will call her with these results.  I sent a prescription in for clobetasol and discussed with her using these if she develops pruritus in between visits.    Per NCCN surveillance recommendations, we will continue with visits every 3 months.  She continues to prefer to have this done slowly in our clinic.  We reviewed signs and symptoms that would be concerning for disease recurrence, and I have stressed the importance of her calling if she develops any of these between visits.  28 minutes of total time was spent for this patient encounter, including preparation, face-to-face counseling with the patient and coordination of care, and documentation of the encounter.  Jeral Pinch, MD  Division of Gynecologic Oncology  Department of Obstetrics and Gynecology  [facility]

## 2021-06-21 NOTE — Patient Instructions (Addendum)
It was good to see you.  I will call you next week once I get your biopsy results back.  My suspicion is that this is related either to swelling after your surgery and radiation or a chronic skin condition.  I will see you in 3 months. Please call if you have any new symptoms before.  I am also going to send a steroid cream to your pharmacy.  Please use this at night 3 times a week if you develop any itching on your bottom.

## 2021-06-24 ENCOUNTER — Other Ambulatory Visit: Payer: Self-pay | Admitting: Cardiology

## 2021-06-24 DIAGNOSIS — I2510 Atherosclerotic heart disease of native coronary artery without angina pectoris: Secondary | ICD-10-CM

## 2021-06-24 DIAGNOSIS — I10 Essential (primary) hypertension: Secondary | ICD-10-CM

## 2021-06-25 ENCOUNTER — Telehealth: Payer: Self-pay | Admitting: Gynecologic Oncology

## 2021-06-25 LAB — SURGICAL PATHOLOGY

## 2021-06-25 NOTE — Telephone Encounter (Signed)
Called the patient to discuss biopsy results.  No answer.  Left a message requesting a call back.  Advised her that I will be in surgery tomorrow but that she can talk with somebody in my clinic about the results.  Vulvar biopsy shows some nonspecific inflammatory changes and findings consistent with lichen sclerosis, a chronic skin condition.  There was no dysplasia (precancer) or carcinoma noted.  I had prescribed clobetasol at her last visit.  This would be treatment for lichen sclerosis.  Just as we discussed at her visit, I would recommend using the cream to to 3 nights a week when she has symptoms.  She does not need to use the cream all the time.  Jeral Pinch MD Gynecologic Oncology

## 2021-06-26 DIAGNOSIS — N1832 Chronic kidney disease, stage 3b: Secondary | ICD-10-CM | POA: Diagnosis not present

## 2021-06-26 DIAGNOSIS — I25118 Atherosclerotic heart disease of native coronary artery with other forms of angina pectoris: Secondary | ICD-10-CM | POA: Diagnosis not present

## 2021-06-26 DIAGNOSIS — G4733 Obstructive sleep apnea (adult) (pediatric): Secondary | ICD-10-CM | POA: Diagnosis not present

## 2021-06-26 DIAGNOSIS — Z0000 Encounter for general adult medical examination without abnormal findings: Secondary | ICD-10-CM | POA: Diagnosis not present

## 2021-06-26 DIAGNOSIS — D6859 Other primary thrombophilia: Secondary | ICD-10-CM | POA: Diagnosis not present

## 2021-06-26 DIAGNOSIS — I110 Hypertensive heart disease with heart failure: Secondary | ICD-10-CM | POA: Diagnosis not present

## 2021-06-26 DIAGNOSIS — E875 Hyperkalemia: Secondary | ICD-10-CM | POA: Diagnosis not present

## 2021-06-26 DIAGNOSIS — I480 Paroxysmal atrial fibrillation: Secondary | ICD-10-CM | POA: Diagnosis not present

## 2021-06-26 DIAGNOSIS — I5032 Chronic diastolic (congestive) heart failure: Secondary | ICD-10-CM | POA: Diagnosis not present

## 2021-06-26 DIAGNOSIS — E039 Hypothyroidism, unspecified: Secondary | ICD-10-CM | POA: Diagnosis not present

## 2021-06-26 DIAGNOSIS — E1122 Type 2 diabetes mellitus with diabetic chronic kidney disease: Secondary | ICD-10-CM | POA: Diagnosis not present

## 2021-06-26 DIAGNOSIS — C519 Malignant neoplasm of vulva, unspecified: Secondary | ICD-10-CM | POA: Diagnosis not present

## 2021-06-28 ENCOUNTER — Telehealth: Payer: Self-pay

## 2021-06-28 NOTE — Telephone Encounter (Signed)
Received phone call from Ms. Hyneman this morning. Patient returning call from Dr. Berline Lopes to review recent vulvar biopsy results.  Per Dr. Berline Lopes Vulvar biopsy shows some nonspecific inflammatory changes and findings consistent with lichen sclerosis, a chronic skin condition.  There was no dysplasia (precancer) or carcinoma noted.  I had prescribed clobetasol at her last visit.  This would be treatment for lichen sclerosis.  Just as we discussed at her visit, I would recommend using the cream to to 3 nights a week when she has symptoms.  She does not need to use the cream all the time  Patient verbalized understanding of above information and reports she does not have any itching at this time. Instructed to call with any needs.

## 2021-07-02 DIAGNOSIS — I5033 Acute on chronic diastolic (congestive) heart failure: Secondary | ICD-10-CM | POA: Diagnosis not present

## 2021-07-02 DIAGNOSIS — R069 Unspecified abnormalities of breathing: Secondary | ICD-10-CM | POA: Diagnosis not present

## 2021-07-04 ENCOUNTER — Other Ambulatory Visit: Payer: Self-pay | Admitting: Cardiology

## 2021-07-05 ENCOUNTER — Telehealth: Payer: Self-pay

## 2021-07-05 MED ORDER — TORSEMIDE 10 MG PO TABS: 10.0000 mg | ORAL_TABLET | Freq: Two times a day (BID) | ORAL | 3 refills | Status: DC

## 2021-07-08 ENCOUNTER — Other Ambulatory Visit: Payer: Self-pay

## 2021-07-08 MED ORDER — DAPAGLIFLOZIN PROPANEDIOL 10 MG PO TABS: 10.0000 mg | ORAL_TABLET | Freq: Every day | ORAL | 0 refills | Status: DC

## 2021-07-08 MED ORDER — TORSEMIDE 10 MG PO TABS: 10.0000 mg | ORAL_TABLET | ORAL | 3 refills | Status: DC | PRN

## 2021-07-09 ENCOUNTER — Encounter: Payer: Self-pay | Admitting: Student

## 2021-07-09 ENCOUNTER — Ambulatory Visit: Payer: Medicare HMO | Admitting: Student

## 2021-07-09 ENCOUNTER — Telehealth: Payer: Self-pay

## 2021-07-09 VITALS — BP 123/47 | HR 60 | Temp 98.3°F | Resp 16 | Ht 61.0 in | Wt 190.2 lb

## 2021-07-09 DIAGNOSIS — I5032 Chronic diastolic (congestive) heart failure: Secondary | ICD-10-CM

## 2021-07-09 DIAGNOSIS — I10 Essential (primary) hypertension: Secondary | ICD-10-CM

## 2021-07-09 DIAGNOSIS — I495 Sick sinus syndrome: Secondary | ICD-10-CM | POA: Diagnosis not present

## 2021-07-09 DIAGNOSIS — Z950 Presence of cardiac pacemaker: Secondary | ICD-10-CM

## 2021-07-09 MED ORDER — TORSEMIDE 10 MG PO TABS: 10.0000 mg | ORAL_TABLET | ORAL | 3 refills | Status: DC | PRN

## 2021-07-09 NOTE — Telephone Encounter (Signed)
Earlier you stated that patient had not had any transmission since Sept 2022 and you were correct. I apologize for the error I made off hearsay, I should have looked in the chart. I have contacted patient and spoke with daughter and gave her the number to [employer] tech support to help them either manually transmit or have transmitter replaced. I will keep you updated.

## 2021-07-12 DIAGNOSIS — E1122 Type 2 diabetes mellitus with diabetic chronic kidney disease: Secondary | ICD-10-CM | POA: Diagnosis not present

## 2021-07-12 DIAGNOSIS — I5032 Chronic diastolic (congestive) heart failure: Secondary | ICD-10-CM | POA: Diagnosis not present

## 2021-07-12 DIAGNOSIS — N183 Chronic kidney disease, stage 3 unspecified: Secondary | ICD-10-CM | POA: Diagnosis not present

## 2021-07-12 DIAGNOSIS — I130 Hypertensive heart and chronic kidney disease with heart failure and stage 1 through stage 4 chronic kidney disease, or unspecified chronic kidney disease: Secondary | ICD-10-CM | POA: Diagnosis not present

## 2021-07-19 DIAGNOSIS — G4733 Obstructive sleep apnea (adult) (pediatric): Secondary | ICD-10-CM | POA: Diagnosis not present

## 2021-07-31 ENCOUNTER — Ambulatory Visit: Payer: Medicare HMO | Admitting: Student

## 2021-07-31 ENCOUNTER — Encounter: Payer: Self-pay | Admitting: Student

## 2021-07-31 VITALS — BP 149/44 | HR 61 | Temp 97.6°F | Resp 16 | Ht 61.0 in | Wt 191.4 lb

## 2021-07-31 DIAGNOSIS — I5032 Chronic diastolic (congestive) heart failure: Secondary | ICD-10-CM

## 2021-07-31 DIAGNOSIS — I10 Essential (primary) hypertension: Secondary | ICD-10-CM | POA: Diagnosis not present

## 2021-07-31 NOTE — Progress Notes (Signed)
Primary Physician/Referring:  Deland Pretty, MD  Patient ID: [name], female    DOB: [birth year], 76 y.o.   MRN: [id number]  Chief Complaint  Patient presents with   Congestive Heart Failure   Follow-up    HPI:    [name]  is a 76 y.o. female  with hypertension, type 2 diabetes mellitus with stage 3 CKD, chronic diastolic heart failure, mild hyperlipidemia on Tricor, nonobstructive CAD (Cath 2019), sick sinus syndrome status post dual-chamber pacemaker 2017, OSA on BiPAP, paroxysmal Afib and atrial flutter/atrial tachycardia.  Her last cardioversion was 09/07/2018 for atrial flutter.  Patient was diagnosed with squamous cell carcinoma of the vulva and underwent colectomy 08/22/2020 at [facility].  Postoperatively patient experienced complex complications including hypotension, hypoxic respiratory failure/pulmonary edema, surgical site infection, acute on chronic CKD.  Patient was discharged home with new supplemental oxygen requirement of 3 L.  During postoperative period patient experienced hypotension requiring pressor support.  She subsequently developed acute on chronic diastolic heart failure requiring diuresis.  Patient was seen in outpatient follow-up, uptitrated guideline directed medical therapy and patient clinically improved.  Patient was last seen in the office 07/09/2021 given hypervolemia advised patient to take torsemide 10 mg p.o. daily for 3 to 5 days and ordered repeat BMP and BMP.  Unfortunately repeat laboratory testing has not been done.  She now presents for follow-up.  Patient completed 3-day course of torsemide following last office visit and dyspnea significantly improved.  She has been feeling well overall since then.  Reports home blood pressure readings averaging 140s/50s mmHg.  She unfortunately has not had repeat labs done but agrees to do this in the next 2 days. Notably patient is not taking Wilder Glade due to cost concerns, she is working on applying for patient  assistance program.  Past Medical History:  Diagnosis Date   Arthritis    Diabetes mellitus without complication ([place])    Dyspnea    Encounter for care of pacemaker 09/03/2018   Family history of kidney cancer    Family history of throat cancer    Family history of uterine cancer    History of chickenpox    History of radiation therapy    Pelvis 10/23/20-12/14/20- Dr. Gery Pray   Hx of psoriatic arthritis    Hyperlipemia    Hypertension    Hypertension 05/03/2018   Hypothyroidism    Mitral valve disorders(424.0)    Obstructive sleep apnea    Pacemaker    Paroxysmal atrial fibrillation ([place]) 10/11/2016   Peripheral neuropathy    Renal disorder Kidney disease stage2   Thyroid disease hypothyroid   Urine incontinence    Vitamin D deficiency    Past Surgical History:  Procedure Laterality Date   BACK SURGERY     BACK SURGERY     CARDIAC CATHETERIZATION N/A 04/29/2015   Procedure: Temporary Pacemaker;  Surgeon: Adrian Prows, MD;  Location: [place] [facility];  Service: Cardiovascular;  Laterality: N/A;   CARDIOVERSION N/A 09/07/2018   Procedure: CARDIOVERSION;  Surgeon: Adrian Prows, MD;  Location: [place];  Service: Cardiovascular;  Laterality: N/A;   CARDIOVERSION     COLONOSCOPY     EP IMPLANTABLE DEVICE N/A 04/30/2015   Procedure: Pacemaker Implant;  Surgeon: Evans Lance, MD;  Location: [place] [facility];  Service: Cardiovascular;  Laterality: N/A;   INTRAVASCULAR PRESSURE WIRE/FFR STUDY N/A 08/31/2017   Procedure: INTRAVASCULAR PRESSURE WIRE/FFR STUDY;  Surgeon: Nigel Mormon, MD;  Location: [place] [facility];  Service: Cardiovascular;  Laterality: N/A;   LEFT AND RIGHT HEART CATHETERIZATION WITH CORONARY ANGIOGRAM N/A 09/30/2011   Procedure: LEFT AND RIGHT HEART CATHETERIZATION WITH CORONARY ANGIOGRAM;  Surgeon: Laverda Page, MD;  Location: [facility];  Service: Cardiovascular;  Laterality: N/A;   RIGHT/LEFT HEART CATH AND CORONARY ANGIOGRAPHY N/A  08/31/2017   Procedure: RIGHT/LEFT HEART CATH AND CORONARY ANGIOGRAPHY;  Surgeon: Nigel Mormon, MD;  Location: [place] [facility];  Service: Cardiovascular;  Laterality: N/A;   TOOTH EXTRACTION  10/07/2018   TUBAL LIGATION     VULVA Milagros Loll BIOPSY N/A 07/26/2020   Procedure: VULVAR BIOPSY;  Surgeon: Lafonda Mosses, MD;  Location: [facility];  Service: Gynecology;  Laterality: N/A;   YAG LASER APPLICATION Right 83/66/2947   Procedure: YAG LASER APPLICATION;  Surgeon: Elta Guadeloupe T. Gershon Crane, MD;  Location: [facility];  Service: Ophthalmology;  Laterality: Right;  pt knows to arrive at 65:46   YAG LASER APPLICATION Left 50/35/4656   Procedure: YAG LASER APPLICATION;  Surgeon: Rutherford Guys, MD;  Location: [facility];  Service: Ophthalmology;  Laterality: Left;   Family History  Problem Relation Age of Onset   Arthritis Mother    Diabetes Mother    Heart disease Mother    Hyperlipidemia Mother    Hypertension Mother    Arthritis Father    Asthma Father    Heart attack Father    Hyperlipidemia Father    Hypertension Father    Stroke Father    Arthritis Sister    Diabetes Sister    Hypertension Sister    Arthritis Sister    Diabetes Sister    Hyperlipidemia Sister    Hypertension Sister    Stroke Sister    Ovarian cancer Sister 39   Pancreatic cancer Sister 35   Uterine cancer Sister 60   Hyperlipidemia Sister    Hypertension Sister    Arthritis Brother    Heart attack Brother    Heart disease Brother    Hyperlipidemia Brother    Hypertension Brother    Alcohol abuse Brother    Arthritis Brother    Diabetes Brother    Early death Brother    Heart disease Brother    Hyperlipidemia Brother    Hypertension Brother    Cancer Maternal Aunt 70       unknown type   Throat cancer Maternal Grandfather        hx smoking/drinking   Hypertension Daughter    Cancer Daughter        "female cancer cells"   Kidney cancer Nephew        dx 70s   Colon cancer Neg Hx    Breast cancer Neg  Hx    Prostate cancer Neg Hx     Social History   Tobacco Use   Smoking status: Never   Smokeless tobacco: Never  Substance Use Topics   Alcohol use: No    Alcohol/week: 0.0 standard drinks of alcohol   Marital Status: Married  ROS  Review of Systems  Cardiovascular:  Negative for chest pain, claudication, dyspnea on exertion, leg swelling, near-syncope, orthopnea, palpitations, paroxysmal nocturnal dyspnea and syncope.  Hematologic/Lymphatic: Does not bruise/bleed easily.  Neurological:  Negative for dizziness.   Objective  Blood pressure (!) 149/44, pulse 61, temperature 97.6 F (36.4 C), temperature source Temporal, resp. rate 16, height 5' 1"  (1.549 m), weight 191 lb 6.4 oz (86.8 kg), SpO2 95 %.     07/31/2021   10:31 AM 07/09/2021  10:18 AM 06/21/2021    2:11 PM  Vitals with BMI  Height 5' 1"  5' 1"    Weight 191 lbs 6 oz 190 lbs 3 oz 188 lbs 4 oz  BMI 06.30 16.01   Systolic 093 235 573  Diastolic 44 47 51  Pulse 61 60 60     Physical Exam Vitals reviewed.  Constitutional:      Appearance: She is well-developed. She is obese.  Neck:     Vascular: No JVD.  Cardiovascular:     Rate and Rhythm: Normal rate and regular rhythm.     Pulses: Intact distal pulses.          Carotid pulses are 2+ on the right side and 2+ on the left side with bruit.      Popliteal pulses are 1+ on the right side and 1+ on the left side.       Dorsalis pedis pulses are 1+ on the right side and 1+ on the left side.       Posterior tibial pulses are 1+ on the right side and 1+ on the left side.     Heart sounds: S1 normal and S2 normal. Murmur heard.     Midsystolic murmur is present with a grade of 2/6 at the upper right sternal border and apex.     No gallop.  Pulmonary:     Effort: Pulmonary effort is normal. No accessory muscle usage or respiratory distress.     Breath sounds: Normal breath sounds. No wheezing, rhonchi or rales.  Musculoskeletal:     Right lower leg: No edema.     Left  lower leg: No edema.   Physical exam unchanged compared to previous office visit.  Laboratory examination:   Recent Labs    09/03/20 1224 09/06/20 1323  NA 141 139  K 5.1 4.9  CL 103 92*  CO2 29 30*  GLUCOSE 138* 151*  BUN 28* 40*  CREATININE 1.41* 1.48*  CALCIUM 10.3 10.0  GFRNONAA 38*  --    CrCl cannot be calculated (Patient's most recent lab result is older than the maximum 21 days allowed.).     Latest Ref Rng & Units 09/06/2020    1:23 PM 09/03/2020   12:24 PM 07/26/2020   11:15 AM  CMP  Glucose 65 - 99 mg/dL 151  138  158   BUN 8 - 27 mg/dL 40  28  22   Creatinine 0.57 - 1.00 mg/dL 1.48  1.41  1.12   Sodium 134 - 144 mmol/L 139  141  139   Potassium 3.5 - 5.2 mmol/L 4.9  5.1  4.0   Chloride 96 - 106 mmol/L 92  103  105   CO2 20 - 29 mmol/L 30  29  27    Calcium 8.7 - 10.3 mg/dL 10.0  10.3  10.3   Total Protein 6.5 - 8.1 g/dL  6.8    Total Bilirubin 0.3 - 1.2 mg/dL  0.3    Alkaline Phos 38 - 126 U/L  38    AST 15 - 41 U/L  13    ALT 0 - 44 U/L  13        Latest Ref Rng & Units 09/03/2020   12:24 PM 07/26/2020   11:15 AM 08/26/2019   10:55 AM  CBC  WBC 4.0 - 10.5 K/uL 8.1  7.9  7.1   Hemoglobin 12.0 - 15.0 g/dL 9.9  13.4  13.6   Hematocrit 36.0 - 46.0 % 31.0  41.7  43.0   Platelets 150 - 400 K/uL 223  148  155    Lipid Panel     Component Value Date/Time   CHOL 168 08/29/2017 0328   TRIG 544 (H) 08/29/2017 0328   HDL 19 (L) 08/29/2017 0328   CHOLHDL 8.8 08/29/2017 0328   VLDL UNABLE TO CALCULATE IF TRIGLYCERIDE OVER 400 mg/dL 08/29/2017 0328   LDLCALC UNABLE TO CALCULATE IF TRIGLYCERIDE OVER 400 mg/dL 08/29/2017 0328   HEMOGLOBIN A1C Lab Results  Component Value Date   HGBA1C 7.8 (H) 07/26/2020   MPG 177.16 07/26/2020   TSH No results for input(s): "TSH" in the last 8760 hours.   External labs:  06/20/2021: Total cholesterol 179, HDL 24, LDL 96, triglycerides 296 TSH 14.2 Hgb 13.5, HCT 41.4, MCV 95.8, platelet 163 ALT 32, AST 22, BUN 23,  creatinine 1.46, potassium 5.7, sodium 142, GFR 34  10/25/2019: Sodium 142, glucose 135, BUN 24, GFR 42, potassium 5.5, AST 22, ALT 37, alk phos 25 A1c 6.9% Total cholesterol 177, triglycerides 240, LDL 100, HDL 29 TSH 3.61  Cholesterol, total 168.000 m 08/29/2017 HDL 17.000 mg 06/09/2019 LDL-C 100.000 ca 06/09/2019 Triglycerides 343.000 m 06/09/2019  A1C 7.200 % 06/24/2019 TSH 5.100 08/31/2019  Hemoglobin 13.600 g/d 08/26/2019 Platelets 155.000 K/ 08/26/2019  Creatinine, Serum 1.340 mg/ 08/26/2019 Potassium 4.700 mm 08/26/2019 Magnesium 1.700 MG/ 04/24/2015 ALT (SGPT) 21.000 IU/ 06/24/2019  05/21/2018: HbA1c 6.7. Lipid Panel: Chol 146, Trig 321, HDL 17, LDL 65, VLDL 64. ALK Ph 32.   08/28/17: TSH Normal  Allergies   Allergies  Allergen Reactions   Hydrocodone Other (See Comments)    Lethargic    Invokana [Canagliflozin] Palpitations and Other (See Comments)    Made heart race   Oxycodone Other (See Comments)    Lethargic     Medications Prior to Visit:   Outpatient Medications Prior to Visit  Medication Sig Dispense Refill   amiodarone (PACERONE) 200 MG tablet Take 1/2 (one-half) tablet by mouth once daily 45 tablet 3   amLODipine (NORVASC) 5 MG tablet      Blood Glucose Monitoring Suppl (ACCU-CHEK AVIVA PLUS) w/Device KIT      clobetasol ointment (TEMOVATE) 4.73 % Apply 1 application  topically 2 (two) times a week. ONLY as needed for itching on your vulva 30 g 0   dapagliflozin propanediol (FARXIGA) 10 MG TABS tablet Take 1 tablet (10 mg total) by mouth daily before breakfast. 30 tablet 0   ELIQUIS 5 MG TABS tablet Take 1 tablet by mouth twice daily 180 tablet 0   fenofibrate 160 MG tablet Take 160 mg by mouth daily with supper.      hydrocortisone (ANUSOL-HC) 25 MG suppository Place 1 suppository (25 mg total) rectally 2 (two) times daily. 12 suppository 0   insulin aspart protamine- aspart (NOVOLOG MIX 70/30) (70-30) 100 UNIT/ML injection Inject 35-45 Units into the skin  See admin instructions. Inject 45 units into the skin with breakfast, 35 units with supper (may adjust based on blood sugar readings)     isosorbide mononitrate (IMDUR) 60 MG 24 hr tablet Take 1 tablet by mouth once daily 90 tablet 0   levothyroxine (SYNTHROID) 100 MCG tablet Take 100 mcg by mouth daily.     levothyroxine (SYNTHROID) 100 MCG tablet Take 1 tablet by mouth daily before breakfast.     lidocaine (XYLOCAINE) 5 % ointment Apply 1 application topically 3 (three) times daily as needed. 35.44 g 2   metoprolol tartrate (LOPRESSOR) 50 MG tablet Take  1 tablet by mouth twice daily 180 tablet 0   nitroGLYCERIN (NITROSTAT) 0.4 MG SL tablet Place 1 tablet (0.4 mg total) under the tongue every 5 (five) minutes as needed for chest pain. 25 tablet 1   olmesartan-hydrochlorothiazide (BENICAR HCT) 40-25 MG tablet Take 1 tablet by mouth daily. 90 tablet 3   spironolactone (ALDACTONE) 25 MG tablet Take 1 tablet by mouth once daily 90 tablet 3   SURE COMFORT INSULIN SYRINGE 31G X 5/16" 0.5 ML MISC      torsemide (DEMADEX) 10 MG tablet Take 1 tablet (10 mg total) by mouth as needed. 30 tablet 3   traMADol (ULTRAM) 50 MG tablet Take 1 tablet (50 mg total) by mouth every 6 (six) hours as needed for severe pain. Do not take and drive 30 tablet 0   No facility-administered medications prior to visit.   Final Medications at End of Visit    Current Meds  Medication Sig   amiodarone (PACERONE) 200 MG tablet Take 1/2 (one-half) tablet by mouth once daily   amLODipine (NORVASC) 5 MG tablet    Blood Glucose Monitoring Suppl (ACCU-CHEK AVIVA PLUS) w/Device KIT    clobetasol ointment (TEMOVATE) 1.61 % Apply 1 application  topically 2 (two) times a week. ONLY as needed for itching on your vulva   dapagliflozin propanediol (FARXIGA) 10 MG TABS tablet Take 1 tablet (10 mg total) by mouth daily before breakfast.   ELIQUIS 5 MG TABS tablet Take 1 tablet by mouth twice daily   fenofibrate 160 MG tablet Take 160 mg by  mouth daily with supper.    hydrocortisone (ANUSOL-HC) 25 MG suppository Place 1 suppository (25 mg total) rectally 2 (two) times daily.   insulin aspart protamine- aspart (NOVOLOG MIX 70/30) (70-30) 100 UNIT/ML injection Inject 35-45 Units into the skin See admin instructions. Inject 45 units into the skin with breakfast, 35 units with supper (may adjust based on blood sugar readings)   isosorbide mononitrate (IMDUR) 60 MG 24 hr tablet Take 1 tablet by mouth once daily   levothyroxine (SYNTHROID) 100 MCG tablet Take 100 mcg by mouth daily.   levothyroxine (SYNTHROID) 100 MCG tablet Take 1 tablet by mouth daily before breakfast.   lidocaine (XYLOCAINE) 5 % ointment Apply 1 application topically 3 (three) times daily as needed.   metoprolol tartrate (LOPRESSOR) 50 MG tablet Take 1 tablet by mouth twice daily   nitroGLYCERIN (NITROSTAT) 0.4 MG SL tablet Place 1 tablet (0.4 mg total) under the tongue every 5 (five) minutes as needed for chest pain.   olmesartan-hydrochlorothiazide (BENICAR HCT) 40-25 MG tablet Take 1 tablet by mouth daily.   spironolactone (ALDACTONE) 25 MG tablet Take 1 tablet by mouth once daily   SURE COMFORT INSULIN SYRINGE 31G X 5/16" 0.5 ML MISC    torsemide (DEMADEX) 10 MG tablet Take 1 tablet (10 mg total) by mouth as needed.   traMADol (ULTRAM) 50 MG tablet Take 1 tablet (50 mg total) by mouth every 6 (six) hours as needed for severe pain. Do not take and drive   Radiology:   No results found.  Cardiac Studies:    Lexiscan myoview stress test 06/15/2017: 1. Lexiscan stress test was performed. Exercise capacity was not assessed. Stress symptoms included dizziness. Peak blood pressure 158/66 mmHg. Stress EKG is non diagnostic for ischemia as it is a pharmacologic stress. In addition, it demonstrated atrial pacing and incomplete LBBB. 2. The overall quality of the study is excellent. There is no evidence of abnormal lung activity.  Stress and rest SPECT images demonstrate  homogeneous tracer distribution throughout the myocardium. Gated SPECT imaging reveals normal myocardial thickening and wall motion. The left ventricular ejection fraction was normal (71%). 3. Low risk study.   Sleep Study 08/11/2017: Positive for Complex Sleep Apnea; On CPAP    R&LHC 08/31/2017: LCx: Nondominant. AV grove LCx 50-60% diffuse disease. RCA: Large dominant. Ostial PDA 50% stenosis, FFR 0.96. No change from 09/30/2011. RA: 5 mmHg RV: 45/4 mmHg. PA: 40/12 mmHg. Mean PA 29 mmHg PCWP: 16 mmHg LV: 130/3 mmHg, LVEDP 13 mmHg CO: 3.6 L/min. CI 1.9 L/min/m2  Direct current cardioversion 09/07/2018: Indication symptomatic A. Flutter. Procedure: Using 50 mg of IV Propofol and 20 IV Lidocaine (for reducing venous pain) for achieving deep sedation, synchronized direct current cardioversion performed. Patient was delivered with 120 Joules of electricity X 1 with success to A-Paced Rhythm. Patient tolerated the procedure well. No immediate complication noted.  Carotid artery duplex 09/06/2020:  Duplex suggests stenosis in the right internal carotid artery (16-49%).  Duplex suggests stenosis in the right external carotid artery (<50%).  Duplex suggests stenosis in the left internal carotid artery (16-49%).  Antegrade right vertebral artery flow. Antegrade left vertebral artery flow.  Compared to 02/09/2014, mild progression of disease in bilateral carotid arteries. Follow up in one year is appropriate if clinically indicated.  PCV ECHOCARDIOGRAM COMPLETE 11/19/2020 Left ventricle cavity is normal in size. Moderate concentric hypertrophy of the left ventricle. Normal global wall motion. Normal LV systolic function with EF 55%. Diastolic function could not be assessed due to paced rhythm. Left atrial cavity is mildly dilated. Mild thickening of mitral valve. Mildly restricted mitral valve leaflets without definite stenosis.  Moderate (Grade III) mitral regurgitation. Mild tricuspid  regurgitation. Estimated pulmonary artery systolic pressure 37 mmHg. Previous study in 2020 noted severe LA dilatation. No other significant change noted.  Pacemaker:  Remote dual-chamber pacemaker transmission 09/19/2020: Longevity 9 years. Lead impedance and thresholds within normal limits. AP 92%, VP 8%. There were no mode switches, no ventricular high rate episodes. Normal pacemaker function.  Scheduled In office pacemaker check 01/08/21  Single (S)/Dual (D)/BV: D. Presenting AP-VS Pacemaker dependant: No. Underlying NSR. AP 96.5%, VP 1.2%. AMS Episodes 0. AT/AF burden 0% . Longest NA. HVR 0. Longevity 8.5 Years. Magnet rate: >85%. Lead measurements: Stable. Histogram: Low (L)/normal (N)/high (H) Normal. Patient activity Low.   Observations: Normal dual-chamber pacemaker function. Changes: No changes.  EKG:  07/09/2021: Atrial paced rhythm with first-degree AV block at a rate of 60 bpm.  Left axis, left anterior fascicular block.  Poor impression, cannot exclude anteroseptal infarct old.  Compared to EKG 05/18/2020, no significant change.  11/10/2019: Atrially paced rhythm with first-degree AV block, ventricularly sensed.  Ventricular rate 60 bpm.  Left axis deviation, left intrafascicular block.  Anteroseptal infarct old.  IVCD, borderline criteria for LVH.  Nonspecific T abnormality.  No significant change from 01/11/2019.  08/30/2018: Atypical atrial flutter with 3:1 conduction, V rate 110 bpm, left axis deviation. LBBB.   Assessment     ICD-10-CM   1. Chronic diastolic heart failure (HCC)  I50.32     2. Essential hypertension  I10       No orders of the defined types were placed in this encounter.   There are no discontinued medications.   This patients CHA2DS2-VASc Score 7 (CHF, HTN, DM, Vasc, Age, F) and yearly risk of stroke >9.8%.   Recommendations:   [name]  is a 76 y.o. female  with  hypertension, type 2 diabetes mellitus with stage 3 CKD, chronic diastolic  heart failure, mild hyperlipidemia on fenobibrate, nonobstructive CAD (Cath 2019), sick sinus syndrome status post dual-chamber pacemaker 2017, OSA on BiPAP, paroxysmal Afib and atrial flutter/atrial tachycardia.  Her last cardioversion was 09/07/2018 for atrial flutter.  Patient was diagnosed with squamous cell carcinoma of the vulva and underwent colectomy 08/22/[facility] at [facility].  Postoperatively patient experienced complex complications including hypotension, hypoxic respiratory failure/pulmonary edema, surgical site infection, acute on chronic CKD.  Patient was discharged home with new supplemental oxygen requirement of 3 L.  During postoperative period patient experienced hypotension requiring pressor support.  She subsequently developed acute on chronic diastolic heart failure requiring diuresis.  Patient was seen in outpatient follow-up, uptitrated guideline directed medical therapy and patient clinically improved.  Patient was last seen in the office 07/09/2021 given hypervolemia advised patient to take torsemide 10 mg p.o. daily for 3 to 5 days and ordered repeat BMP and BMP.  Unfortunately repeat laboratory testing has not been done.  She now presents for follow-up.  Patient is feeling much improved since last office visit and there is no clinical evidence of acute heart failure at today's office visit.  Patient agrees to have repeat labs done within the next 2 days.  If potassium remains elevated will consider reducing spironolactone to 12.5 mg p.o. daily.  We will continue to work with patient on patient assistance paperwork for [place] which I would recommend starting if hemodynamics and renal function allow.  Follow-up in 6 months, sooner if needed.   Alethia Berthold, PA-C 07/31/2021, 10:48 AM Office: [phone]

## 2021-08-01 DIAGNOSIS — I5033 Acute on chronic diastolic (congestive) heart failure: Secondary | ICD-10-CM | POA: Diagnosis not present

## 2021-08-01 DIAGNOSIS — I5032 Chronic diastolic (congestive) heart failure: Secondary | ICD-10-CM | POA: Diagnosis not present

## 2021-08-01 DIAGNOSIS — R069 Unspecified abnormalities of breathing: Secondary | ICD-10-CM | POA: Diagnosis not present

## 2021-08-02 ENCOUNTER — Other Ambulatory Visit: Payer: Self-pay

## 2021-08-02 LAB — BRAIN NATRIURETIC PEPTIDE: BNP: 133.3 pg/mL — ABNORMAL HIGH (ref 0.0–100.0)

## 2021-08-02 LAB — BASIC METABOLIC PANEL
BUN/Creatinine Ratio: 13 (ref 12–28)
BUN: 20 mg/dL (ref 8–27)
CO2: 27 mmol/L (ref 20–29)
Calcium: 10.8 mg/dL — ABNORMAL HIGH (ref 8.7–10.3)
Chloride: 99 mmol/L (ref 96–106)
Creatinine, Ser: 1.53 mg/dL — ABNORMAL HIGH (ref 0.57–1.00)
Glucose: 142 mg/dL — ABNORMAL HIGH (ref 70–99)
Potassium: 5.8 mmol/L — ABNORMAL HIGH (ref 3.5–5.2)
Sodium: 138 mmol/L (ref 134–144)
eGFR: 35 mL/min/{1.73_m2} — ABNORMAL LOW (ref >59)

## 2021-08-02 MED ORDER — DAPAGLIFLOZIN PROPANEDIOL 10 MG PO TABS: 10.0000 mg | ORAL_TABLET | Freq: Every day | ORAL | 2 refills | Status: DC

## 2021-08-03 DIAGNOSIS — Z45018 Encounter for adjustment and management of other part of cardiac pacemaker: Secondary | ICD-10-CM | POA: Diagnosis not present

## 2021-08-03 DIAGNOSIS — I495 Sick sinus syndrome: Secondary | ICD-10-CM | POA: Diagnosis not present

## 2021-08-05 NOTE — Progress Notes (Signed)
Called pt no answer, left a vm

## 2021-08-05 NOTE — Addendum Note (Signed)
Addended by: Lawerance Cruel L on: 08/05/2021 12:48 PM   Modules accepted: Orders

## 2021-08-07 NOTE — Progress Notes (Signed)
Called pt to inform her about her lab results. Pt understood

## 2021-08-12 DIAGNOSIS — E1122 Type 2 diabetes mellitus with diabetic chronic kidney disease: Secondary | ICD-10-CM | POA: Diagnosis not present

## 2021-08-12 DIAGNOSIS — I130 Hypertensive heart and chronic kidney disease with heart failure and stage 1 through stage 4 chronic kidney disease, or unspecified chronic kidney disease: Secondary | ICD-10-CM | POA: Diagnosis not present

## 2021-08-12 DIAGNOSIS — I5032 Chronic diastolic (congestive) heart failure: Secondary | ICD-10-CM | POA: Diagnosis not present

## 2021-08-12 DIAGNOSIS — N183 Chronic kidney disease, stage 3 unspecified: Secondary | ICD-10-CM | POA: Diagnosis not present

## 2021-08-15 DIAGNOSIS — I5032 Chronic diastolic (congestive) heart failure: Secondary | ICD-10-CM | POA: Diagnosis not present

## 2021-08-15 DIAGNOSIS — E039 Hypothyroidism, unspecified: Secondary | ICD-10-CM | POA: Diagnosis not present

## 2021-08-16 DIAGNOSIS — G4733 Obstructive sleep apnea (adult) (pediatric): Secondary | ICD-10-CM | POA: Diagnosis not present

## 2021-08-16 LAB — BASIC METABOLIC PANEL
BUN/Creatinine Ratio: 18 (ref 12–28)
BUN: 27 mg/dL (ref 8–27)
CO2: 24 mmol/L (ref 20–29)
Calcium: 10.5 mg/dL — ABNORMAL HIGH (ref 8.7–10.3)
Chloride: 99 mmol/L (ref 96–106)
Creatinine, Ser: 1.52 mg/dL — ABNORMAL HIGH (ref 0.57–1.00)
Glucose: 168 mg/dL — ABNORMAL HIGH (ref 70–99)
Potassium: 4.8 mmol/L (ref 3.5–5.2)
Sodium: 137 mmol/L (ref 134–144)
eGFR: 35 mL/min/{1.73_m2} — ABNORMAL LOW (ref >59)

## 2021-08-20 ENCOUNTER — Other Ambulatory Visit: Payer: Self-pay

## 2021-08-20 MED ORDER — DAPAGLIFLOZIN PROPANEDIOL 10 MG PO TABS: 10.0000 mg | ORAL_TABLET | Freq: Every day | ORAL | 2 refills | Status: DC

## 2021-09-01 DIAGNOSIS — I5033 Acute on chronic diastolic (congestive) heart failure: Secondary | ICD-10-CM | POA: Diagnosis not present

## 2021-09-01 DIAGNOSIS — R069 Unspecified abnormalities of breathing: Secondary | ICD-10-CM | POA: Diagnosis not present

## 2021-09-02 DIAGNOSIS — E039 Hypothyroidism, unspecified: Secondary | ICD-10-CM | POA: Diagnosis not present

## 2021-09-02 DIAGNOSIS — E1122 Type 2 diabetes mellitus with diabetic chronic kidney disease: Secondary | ICD-10-CM | POA: Diagnosis not present

## 2021-09-02 DIAGNOSIS — E785 Hyperlipidemia, unspecified: Secondary | ICD-10-CM | POA: Diagnosis not present

## 2021-09-02 DIAGNOSIS — E559 Vitamin D deficiency, unspecified: Secondary | ICD-10-CM | POA: Diagnosis not present

## 2021-09-05 ENCOUNTER — Other Ambulatory Visit: Payer: Self-pay | Admitting: Cardiology

## 2021-09-09 DIAGNOSIS — I129 Hypertensive chronic kidney disease with stage 1 through stage 4 chronic kidney disease, or unspecified chronic kidney disease: Secondary | ICD-10-CM | POA: Diagnosis not present

## 2021-09-09 DIAGNOSIS — E039 Hypothyroidism, unspecified: Secondary | ICD-10-CM | POA: Diagnosis not present

## 2021-09-09 DIAGNOSIS — E1122 Type 2 diabetes mellitus with diabetic chronic kidney disease: Secondary | ICD-10-CM | POA: Diagnosis not present

## 2021-09-09 DIAGNOSIS — E559 Vitamin D deficiency, unspecified: Secondary | ICD-10-CM | POA: Diagnosis not present

## 2021-09-09 DIAGNOSIS — E785 Hyperlipidemia, unspecified: Secondary | ICD-10-CM | POA: Diagnosis not present

## 2021-09-23 ENCOUNTER — Inpatient Hospital Stay: Payer: Medicare HMO | Attending: Gynecologic Oncology | Admitting: Gynecologic Oncology

## 2021-09-23 ENCOUNTER — Other Ambulatory Visit: Payer: Self-pay

## 2021-09-23 ENCOUNTER — Encounter: Payer: Self-pay | Admitting: Gynecologic Oncology

## 2021-09-23 ENCOUNTER — Encounter: Payer: Self-pay | Admitting: *Deleted

## 2021-09-23 VITALS — BP 135/54 | HR 62 | Temp 98.1°F | Resp 16 | Ht 61.42 in | Wt 187.0 lb

## 2021-09-23 DIAGNOSIS — C519 Malignant neoplasm of vulva, unspecified: Secondary | ICD-10-CM

## 2021-09-23 DIAGNOSIS — R198 Other specified symptoms and signs involving the digestive system and abdomen: Secondary | ICD-10-CM | POA: Insufficient documentation

## 2021-09-23 DIAGNOSIS — Z923 Personal history of irradiation: Secondary | ICD-10-CM | POA: Diagnosis not present

## 2021-09-23 DIAGNOSIS — R69 Illness, unspecified: Secondary | ICD-10-CM | POA: Diagnosis not present

## 2021-09-23 DIAGNOSIS — Z8544 Personal history of malignant neoplasm of other female genital organs: Secondary | ICD-10-CM | POA: Diagnosis not present

## 2021-09-23 DIAGNOSIS — R195 Other fecal abnormalities: Secondary | ICD-10-CM | POA: Diagnosis not present

## 2021-09-23 DIAGNOSIS — R159 Full incontinence of feces: Secondary | ICD-10-CM | POA: Diagnosis not present

## 2021-09-23 DIAGNOSIS — R32 Unspecified urinary incontinence: Secondary | ICD-10-CM | POA: Insufficient documentation

## 2021-09-23 DIAGNOSIS — A630 Anogenital (venereal) warts: Secondary | ICD-10-CM | POA: Diagnosis not present

## 2021-09-23 NOTE — Progress Notes (Signed)
Gynecologic Oncology Return Clinic Visit  09/23/21  Reason for Visit: Follow-up in the setting of vulvar cancer  Treatment History: Oncology History  Vulvar cancer ([place])  07/20/2020 Initial Diagnosis   Vulvar cancer ([place])   07/26/2020 Surgery   EUA, vulvar biopsies  Findings: Pictures taken in media.  On exam, there is about a 4 x 3-1/2 cm area concerning for involvement by carcinoma that spans between the clitoris (replacing the bottom aspect of the clitoris) to just superior to the urethra.  This is a butterfly lesion that extends along the inner labia bilaterally.  Previous biopsy site is healing well with suture still intact.  There is a firmer and raised area that is approximately 1 cm around the area of the biopsy.  Biopsies taken circumferentially around the lesion noted as well as from the central aspect to help delineate what is cancer and what may be chronic dermatoses.  Given appearance today with patient more comfortable, I am suspicious that the entire lesion is carcinoma.   07/26/2020 Pathology Results   A. VULVA, 6 OCLOCK, INFERIOR ASPECT, BIOPSY:  - Superficial fragments of squamous cell carcinoma.   B. VULVA, 11 OCLOCK, BIOPSY:  - Invasive squamous cell carcinoma.  - No lymphovascular invasion.   C. VULVA, 2 OCLOCK, BIOPSY:  - Invasive squamous cell carcinoma.  - No lymphovascular invasion.   D. VULVA, CENTRAL, BIOPSY:  - Superficial fragment of squamous cell carcinoma.    08/06/2020 Imaging   PET: no evidence of metastatic disease   08/07/2020 Clinical Stage   Stage IB   08/22/2020 Surgery   Partial radical anterior vulvectomy with right inguinal SLN biopsy.  Findings: 4x4cm butterfly lesion on the anterior vulva, spanning from just superior to the clitoris to just superior to the urethral meatus. Lesion crossed the midline, more significant lesion on the right. LSG prior to surgery showed bilateral mapping. No hot or blue SLN identified on the left, mildly blue and  hot node identified as the SLN on the right. Decision made to abort left-sided LND given hypertension and difficulty ventilating. Additionally, given intra-op cardiac and respiratory status, decision made to proceed with a less radical resection (very close if not positive peri-urethral margin visibly.   08/22/2020 Pathology Results   Stage IB, lymph node assessment only on the right given comorbidities and cardiac issues during surgery  Tumor Focality:    Unifocal     Tumor Site:    Right vulva     Tumor Site:    Left vulva     Tumor Size:    Greatest Dimension (Centimeters): 4 cm     Histologic Type:    Squamous cell carcinoma, HPV-independent     Histologic Grade:    G3, poorly differentiated     Depth of Tumor Invasion:    6 mm     Tumor Border:    Infiltrating     Other Tissue / Organ Involvement:    Not applicable     Lymphovascular Invasion:    Not identified   MARGINS     Margin Status for Invasive Carcinoma:    All margins negative for invasive carcinoma       Closest Margin(s) to Invasive Carcinoma:    Peripheral: 12:00       Distance from Invasive Carcinoma to Closest Margin:    1 mm     Margin Status for HSIL (VIN2-3) or dVIN:    All margins negative for high-grade squamous intraepithelial lesion (HSIL) and / or differentiated  vulvar intraepithelial neoplasia (dVIN)   REGIONAL LYMPH NODES     Regional Lymph Node Status:           :    All regional lymph nodes negative for tumor cells       Total Number of Lymph Nodes Examined:    1         Nodal Site(s) Examined:    Right inguinal       Number of Sentinel Nodes Examined:    1    09/06/2020 Genetic Testing   Negative genetic testing:  No pathogenic variants detected on the [facility] Multi-Cancer + RNA panel. A variant of uncertain significance (VUS) was detected in the POLD1 gene called c.427G>A (p.Gly143Ser). The report date is 09/06/2020.  The Multi-Cancer + RNA Panel offered by [facility] includes sequencing and/or  deletion/duplication analysis of the following 84 genes:  AIP*, ALK, APC*, ATM*, AXIN2*, BAP1*, BARD1*, BLM*, BMPR1A*, BRCA1*, BRCA2*, BRIP1*, CASR, CDC73*, CDH1*, CDK4, CDKN1B*, CDKN1C*, CDKN2A, CEBPA, CHEK2*, CTNNA1*, DICER1*, DIS3L2*, EGFR, EPCAM, FH*, FLCN*, GATA2*, GPC3, GREM1, HOXB13, HRAS, KIT, MAX*, MEN1*, MET, MITF, MLH1*, MSH2*, MSH3*, MSH6*, MUTYH*, NBN*, NF1*, NF2*, NTHL1*, PALB2*, PDGFRA, PHOX2B, PMS2*, POLD1*, POLE*, POT1*, PRKAR1A*, PTCH1*, PTEN*, RAD50*, RAD51C*, RAD51D*, RB1*, RECQL4, RET, RUNX1*, SDHA*, SDHAF2*, SDHB*, SDHC*, SDHD*, SMAD4*, SMARCA4*, SMARCB1*, SMARCE1*, STK11*, SUFU*, TERC, TERT, TMEM127*, Tp53*, TSC1*, TSC2*, VHL*, WRN*, and WT1.  RNA analysis is performed for * genes.    10/23/2020 - 12/14/2020 Radiation Therapy   10/23/2020 through 12/14/2020 Site Technique Total Dose (Gy) Dose per Fx (Gy) Completed Fx Beam Energies  Vulva: Pelvis IMRT 50.4/50.4 1.8 28/28 6X  Vulva: Pelvis_Bst IMRT 9/9 1.8 5/5 6X         Interval History: Presents today for follow-up.  Reports overall doing well.  Continues to struggle with fatigue, not much change since I saw her last.  Has shortness of breath with increased activity.  Denies any vaginal bleeding or discharge.  Reports that since radiation, has continued to have mucus-like dark rectal discharge.  Metamucil has helped to add some bulk to her stools.  She is still having some urinary incontinence, but this is less frequent.  She continues to endorse her stools are loose although not diarrhea.  Reports bladder function at baseline.  Past Medical/Surgical History: Past Medical History:  Diagnosis Date   Arthritis    Diabetes mellitus without complication ([place])    Dyspnea    Encounter for care of pacemaker 09/03/2018   Family history of kidney cancer    Family history of throat cancer    Family history of uterine cancer    History of chickenpox    History of radiation therapy    Pelvis 10/23/20-12/14/20- Dr. Gery Pray    Hx of psoriatic arthritis    Hyperlipemia    Hypertension    Hypertension 05/03/2018   Hypothyroidism    Mitral valve disorders(424.0)    Obstructive sleep apnea    Pacemaker    Paroxysmal atrial fibrillation ([place]) 10/11/2016   Peripheral neuropathy    Renal disorder Kidney disease stage2   Thyroid disease hypothyroid   Urine incontinence    Vitamin D deficiency     Past Surgical History:  Procedure Laterality Date   BACK SURGERY     BACK SURGERY     CARDIAC CATHETERIZATION N/A 04/29/2015   Procedure: Temporary Pacemaker;  Surgeon: Adrian Prows, MD;  Location: [facility];  Service: Cardiovascular;  Laterality: N/A;   CARDIOVERSION N/A 09/07/2018   Procedure:  CARDIOVERSION;  Surgeon: Adrian Prows, MD;  Location: [facility];  Service: Cardiovascular;  Laterality: N/A;   CARDIOVERSION     COLONOSCOPY     EP IMPLANTABLE DEVICE N/A 04/30/2015   Procedure: Pacemaker Implant;  Surgeon: Evans Lance, MD;  Location: [facility];  Service: Cardiovascular;  Laterality: N/A;   INTRAVASCULAR PRESSURE WIRE/FFR STUDY N/A 08/31/2017   Procedure: INTRAVASCULAR PRESSURE WIRE/FFR STUDY;  Surgeon: Nigel Mormon, MD;  Location: [facility];  Service: Cardiovascular;  Laterality: N/A;   LEFT AND RIGHT HEART CATHETERIZATION WITH CORONARY ANGIOGRAM N/A 09/30/2011   Procedure: LEFT AND RIGHT HEART CATHETERIZATION WITH CORONARY ANGIOGRAM;  Surgeon: Laverda Page, MD;  Location: [facility];  Service: Cardiovascular;  Laterality: N/A;   RIGHT/LEFT HEART CATH AND CORONARY ANGIOGRAPHY N/A 08/31/2017   Procedure: RIGHT/LEFT HEART CATH AND CORONARY ANGIOGRAPHY;  Surgeon: Nigel Mormon, MD;  Location: [facility];  Service: Cardiovascular;  Laterality: N/A;   TOOTH EXTRACTION  10/07/2018   TUBAL LIGATION     VULVA Milagros Loll BIOPSY N/A 07/26/2020   Procedure: VULVAR BIOPSY;  Surgeon: Lafonda Mosses, MD;  Location: [facility];  Service: Gynecology;  Laterality: N/A;    YAG LASER APPLICATION Right 41/93/7902   Procedure: YAG LASER APPLICATION;  Surgeon: Elta Guadeloupe T. Gershon Crane, MD;  Location: [facility];  Service: Ophthalmology;  Laterality: Right;  pt knows to arrive at 40:97   YAG LASER APPLICATION Left 35/32/9924   Procedure: YAG LASER APPLICATION;  Surgeon: Rutherford Guys, MD;  Location: [facility];  Service: Ophthalmology;  Laterality: Left;    Family History  Problem Relation Age of Onset   Arthritis Mother    Diabetes Mother    Heart disease Mother    Hyperlipidemia Mother    Hypertension Mother    Arthritis Father    Asthma Father    Heart attack Father    Hyperlipidemia Father    Hypertension Father    Stroke Father    Arthritis Sister    Diabetes Sister    Hypertension Sister    Arthritis Sister    Diabetes Sister    Hyperlipidemia Sister    Hypertension Sister    Stroke Sister    Ovarian cancer Sister 70   Pancreatic cancer Sister 16   Uterine cancer Sister 57   Hyperlipidemia Sister    Hypertension Sister    Arthritis Brother    Heart attack Brother    Heart disease Brother    Hyperlipidemia Brother    Hypertension Brother    Alcohol abuse Brother    Arthritis Brother    Diabetes Brother    Early death Brother    Heart disease Brother    Hyperlipidemia Brother    Hypertension Brother    Cancer Maternal Aunt 70       unknown type   Throat cancer Maternal Grandfather        hx smoking/drinking   Hypertension Daughter    Cancer Daughter        "female cancer cells"   Kidney cancer Nephew        dx 109s   Colon cancer Neg Hx    Breast cancer Neg Hx    Prostate cancer Neg Hx     Social History   Socioeconomic History   Marital status: Widowed    Spouse name: Not on file   Number of children: 3   Years of education: Not on file   Highest education level: Not on file  Occupational History   Not on file  Tobacco Use   Smoking status: Never   Smokeless tobacco: Never  Vaping Use   Vaping Use: Never used  Substance and  Sexual Activity   Alcohol use: No    Alcohol/week: 0.0 standard drinks of alcohol   Drug use: No   Sexual activity: Not Currently    Birth control/protection: Post-menopausal  Other Topics Concern   Not on file  Social History Narrative   Husband recently passed away on 05-21-2020.   Social Determinants of Health   Financial Resource Strain: Not on file  Food Insecurity: Not on file  Transportation Needs: Not on file  Physical Activity: Not on file  Stress: Not on file  Social Connections: Not on file    Current Medications:  Current Outpatient Medications:    amiodarone (PACERONE) 200 MG tablet, Take 1/2 (one-half) tablet by mouth once daily, Disp: 45 tablet, Rfl: 3   Blood Glucose Monitoring Suppl (ACCU-CHEK AVIVA PLUS) w/Device KIT, , Disp: , Rfl:    cholecalciferol (VITAMIN D3) 25 MCG (1000 UNIT) tablet, Take 1,000 Units by mouth daily., Disp: , Rfl:    dapagliflozin propanediol (FARXIGA) 10 MG TABS tablet, Take 1 tablet (10 mg total) by mouth daily before breakfast., Disp: 30 tablet, Rfl: 2   ELIQUIS 5 MG TABS tablet, Take 1 tablet by mouth twice daily, Disp: 180 tablet, Rfl: 0   fenofibrate 160 MG tablet, Take 160 mg by mouth daily with supper. , Disp: , Rfl:    insulin aspart protamine- aspart (NOVOLOG MIX 70/30) (70-30) 100 UNIT/ML injection, Inject 35-45 Units into the skin See admin instructions. Inject 45 units into the skin with breakfast, 35 units with supper (may adjust based on blood sugar readings), Disp: , Rfl:    isosorbide mononitrate (IMDUR) 60 MG 24 hr tablet, Take 1 tablet by mouth once daily, Disp: 90 tablet, Rfl: 0   levothyroxine (SYNTHROID) 100 MCG tablet, Take 100 mcg by mouth daily., Disp: , Rfl:    metoprolol tartrate (LOPRESSOR) 50 MG tablet, Take 1 tablet by mouth twice daily, Disp: 180 tablet, Rfl: 0   nitroGLYCERIN (NITROSTAT) 0.4 MG SL tablet, Place 1 tablet (0.4 mg total) under the tongue every 5 (five) minutes as needed for chest pain.,  Disp: 25 tablet, Rfl: 1   olmesartan-hydrochlorothiazide (BENICAR HCT) 40-25 MG tablet, Take 1 tablet by mouth daily., Disp: 90 tablet, Rfl: 3   psyllium (METAMUCIL) 58.6 % packet, Take 1 packet by mouth daily., Disp: , Rfl:    spironolactone (ALDACTONE) 25 MG tablet, Take 1 tablet by mouth once daily, Disp: 90 tablet, Rfl: 3   SURE COMFORT INSULIN SYRINGE 31G X 5/16" 0.5 ML MISC, , Disp: , Rfl:    torsemide (DEMADEX) 10 MG tablet, Take 1 tablet (10 mg total) by mouth as needed., Disp: 30 tablet, Rfl: 3   clobetasol ointment (TEMOVATE) 5.36 %, Apply 1 application  topically 2 (two) times a week. ONLY as needed for itching on your vulva (Patient not taking: Reported on 09/23/2021), Disp: 30 g, Rfl: 0   hydrocortisone (ANUSOL-HC) 25 MG suppository, Place 1 suppository (25 mg total) rectally 2 (two) times daily. (Patient not taking: Reported on 09/23/2021), Disp: 12 suppository, Rfl: 0   lidocaine (XYLOCAINE) 5 % ointment, Apply 1 application topically 3 (three) times daily as needed. (Patient not taking: Reported on 09/23/2021), Disp: 35.44 g, Rfl: 2   traMADol (ULTRAM) 50 MG tablet, Take 1 tablet (50 mg total) by mouth every  6 (six) hours as needed for severe pain. Do not take and drive (Patient not taking: Reported on 09/23/2021), Disp: 30 tablet, Rfl: 0  Review of Systems: + Shortness of breath, loose stools Denies appetite changes, fevers, chills, fatigue, unexplained weight changes. Denies hearing loss, neck lumps or masses, mouth sores, ringing in ears or voice changes. Denies cough or wheezing.   Denies chest pain or palpitations. Denies leg swelling. Denies abdominal distention, pain, blood in stools, constipation, nausea, vomiting, or early satiety. Denies pain with intercourse, dysuria, frequency, hematuria or incontinence. Denies hot flashes, pelvic pain, vaginal bleeding or vaginal discharge.   Denies joint pain, back pain or muscle pain/cramps. Denies itching, rash, or wounds. Denies  dizziness, headaches, numbness or seizures. Denies swollen lymph nodes or glands, denies easy bruising or bleeding. Denies anxiety, depression, confusion, or decreased concentration.  Physical Exam: BP (!) 135/54 (BP Location: Right Arm, Patient Position: Sitting)   Pulse 62   Temp 98.1 F (36.7 C) (Oral)   Resp 16   Ht 5' 1.42" (1.56 m)   Wt 187 lb (84.8 kg)   SpO2 94%   BMI 34.86 kg/m  General: Alert, oriented, no acute distress. HEENT: Posterior oropharynx clear, sclera anicteric. Chest: Clear to auscultation bilaterally.  No wheezes or rhonchi. Cardiovascular: Regular rate and rhythm, no murmurs. Abdomen: Obese, soft, nontender.  Normoactive bowel sounds.  No masses or hepatosplenomegaly appreciated.   Extremities: Grossly normal range of motion.  Warm, well perfused.  Trace edema bilaterally. Skin: No rashes or lesions noted. Lymphatics: No cervical, supraclavicular, or inguinal adenopathy. GU: External female genitalia atrophic with radiation changes present especially along the perineum and posterior fourchette.  No inguinal adenopathy appreciated.  Prior incision has completely healed.  No areas of ulceration or leukoplakia.  No firmness or nodularity.  Laboratory & Radiologic Studies: 06/21/21: vulvar biopsy Squamous cell hyperplasia, hyperkeratosis, nonspecific inflammation. Changes c/w hypertrophic lichen sclerosus. No in situ or invasive carcinoma.  Assessment & Plan: [name] is a 76 y.o. woman with at least Stage IB grade 3 SCC of the vulva, HPV - independent, who presents for surveillance after completing adjuvant radiation in 12/2020.   Patient is overall doing very well.  She is NED on exam today.  She denies any vulvar symptoms.  Has clobetasol to use at home if she develops any vulvar pruritus.  While her symptoms are likely the result of radiation, she is not having typical symptoms for radiation proctitis.  Her fecal incontinence has improved with adding  some bulk to her stool.  She may benefit from some pelvic floor physical therapy but I suggested that we refer her to gastroenterology for further work-up.  She is unsure how long it has been since she has had a colonoscopy.  Referral placed today to the practice at [facility]   Per NCCN surveillance recommendations, we will continue with visits every 3 months.  She continues to prefer to have this done slowly in our clinic.  We reviewed signs and symptoms that would be concerning for disease recurrence, and I have stressed the importance of her calling if she develops any of these between visits.  28 minutes of total time was spent for this patient encounter, including preparation, face-to-face counseling with the patient and coordination of care, and documentation of the encounter.  Jeral Pinch, MD  Division of Gynecologic Oncology  Department of Obstetrics and Gynecology  [facility]

## 2021-09-23 NOTE — Patient Instructions (Signed)
It was good to see you today.  I do not see or feel any evidence of cancer recurrence on your exam.  Please call if you develop any new symptoms like vaginal bleeding or discharge, itching, or pain on your bottom.  Placing a referral for you to see gastroenterology given your continued GI symptoms.

## 2021-09-24 ENCOUNTER — Other Ambulatory Visit: Payer: Self-pay | Admitting: Cardiology

## 2021-09-26 ENCOUNTER — Other Ambulatory Visit: Payer: Self-pay | Admitting: Cardiology

## 2021-09-26 DIAGNOSIS — I2510 Atherosclerotic heart disease of native coronary artery without angina pectoris: Secondary | ICD-10-CM

## 2021-09-26 DIAGNOSIS — I10 Essential (primary) hypertension: Secondary | ICD-10-CM

## 2021-10-02 DIAGNOSIS — R069 Unspecified abnormalities of breathing: Secondary | ICD-10-CM | POA: Diagnosis not present

## 2021-10-02 DIAGNOSIS — I5033 Acute on chronic diastolic (congestive) heart failure: Secondary | ICD-10-CM | POA: Diagnosis not present

## 2021-10-03 ENCOUNTER — Other Ambulatory Visit: Payer: Self-pay | Admitting: Cardiology

## 2021-10-07 ENCOUNTER — Telehealth: Payer: Self-pay | Admitting: Cardiology

## 2021-10-07 ENCOUNTER — Other Ambulatory Visit: Payer: Self-pay

## 2021-10-07 DIAGNOSIS — I2510 Atherosclerotic heart disease of native coronary artery without angina pectoris: Secondary | ICD-10-CM

## 2021-10-07 NOTE — Telephone Encounter (Signed)
done

## 2021-10-07 NOTE — Telephone Encounter (Signed)
Patient needs order for carotid duplex.

## 2021-10-10 ENCOUNTER — Ambulatory Visit: Payer: Medicare HMO

## 2021-10-10 DIAGNOSIS — E039 Hypothyroidism, unspecified: Secondary | ICD-10-CM | POA: Diagnosis not present

## 2021-10-10 DIAGNOSIS — I2510 Atherosclerotic heart disease of native coronary artery without angina pectoris: Secondary | ICD-10-CM | POA: Diagnosis not present

## 2021-10-10 DIAGNOSIS — I340 Nonrheumatic mitral (valve) insufficiency: Secondary | ICD-10-CM | POA: Diagnosis not present

## 2021-10-12 DIAGNOSIS — N183 Chronic kidney disease, stage 3 unspecified: Secondary | ICD-10-CM | POA: Diagnosis not present

## 2021-10-12 DIAGNOSIS — I129 Hypertensive chronic kidney disease with stage 1 through stage 4 chronic kidney disease, or unspecified chronic kidney disease: Secondary | ICD-10-CM | POA: Diagnosis not present

## 2021-10-12 DIAGNOSIS — E1122 Type 2 diabetes mellitus with diabetic chronic kidney disease: Secondary | ICD-10-CM | POA: Diagnosis not present

## 2021-10-13 ENCOUNTER — Encounter: Payer: Self-pay | Admitting: Gastroenterology

## 2021-10-13 NOTE — Progress Notes (Unsigned)
Referring Provider:*** Primary Care Physician:  Deland Pretty, MD Primary Gastroenterologist:  Dr. Gala Romney  No chief complaint on file.   HPI:   [name] is a 76 y.o. female presenting today at the request of Lafonda Mosses, MD (GYN Onc) for loose stools, fecal incontinence with urgency.  Last colonoscopy 08/16/2008: Pancolonic diverticulosis, otherwise no abnormalities.  Recommended considering another colonoscopy in 10 years.   Patient has history of Vulvar cancer 2022 s/p radiation therapy October 2022-December 2022.   Reviewed office visit with Lafonda Mosses, MD dated 09/23/2021.  Patient reported since radiation therapy, she continued with mucus-like dark rectal discharge.  Metamucil helps some to bulk up stools but still having loose stools.  Also with urinary incontinence.    Past Medical History:  Diagnosis Date   Arthritis    Diabetes mellitus without complication ([place])    Dyspnea    Encounter for care of pacemaker 09/03/2018   Family history of kidney cancer    Family history of throat cancer    Family history of uterine cancer    History of chickenpox    History of radiation therapy    Pelvis 10/23/20-12/14/20- Dr. Gery Pray   Hx of psoriatic arthritis    Hyperlipemia    Hypertension    Hypertension 05/03/2018   Hypothyroidism    Mitral valve disorders(424.0)    Obstructive sleep apnea    Pacemaker    Paroxysmal atrial fibrillation ([place]) 10/11/2016   Peripheral neuropathy    Renal disorder Kidney disease stage2   Thyroid disease hypothyroid   Urine incontinence    Vitamin D deficiency     Past Surgical History:  Procedure Laterality Date   BACK SURGERY     BACK SURGERY     CARDIAC CATHETERIZATION N/A 04/29/2015   Procedure: Temporary Pacemaker;  Surgeon: Adrian Prows, MD;  Location: [facility];  Service: Cardiovascular;  Laterality: N/A;   CARDIOVERSION N/A 09/07/2018   Procedure: CARDIOVERSION;  Surgeon: Adrian Prows, MD;   Location: [facility];  Service: Cardiovascular;  Laterality: N/A;   CARDIOVERSION     COLONOSCOPY     EP IMPLANTABLE DEVICE N/A 04/30/2015   Procedure: Pacemaker Implant;  Surgeon: Evans Lance, MD;  Location: [facility];  Service: Cardiovascular;  Laterality: N/A;   INTRAVASCULAR PRESSURE WIRE/FFR STUDY N/A 08/31/2017   Procedure: INTRAVASCULAR PRESSURE WIRE/FFR STUDY;  Surgeon: Nigel Mormon, MD;  Location: [facility];  Service: Cardiovascular;  Laterality: N/A;   LEFT AND RIGHT HEART CATHETERIZATION WITH CORONARY ANGIOGRAM N/A 09/30/2011   Procedure: LEFT AND RIGHT HEART CATHETERIZATION WITH CORONARY ANGIOGRAM;  Surgeon: Laverda Page, MD;  Location: [facility];  Service: Cardiovascular;  Laterality: N/A;   RIGHT/LEFT HEART CATH AND CORONARY ANGIOGRAPHY N/A 08/31/2017   Procedure: RIGHT/LEFT HEART CATH AND CORONARY ANGIOGRAPHY;  Surgeon: Nigel Mormon, MD;  Location: [facility];  Service: Cardiovascular;  Laterality: N/A;   TOOTH EXTRACTION  10/07/2018   TUBAL LIGATION     VULVA Milagros Loll BIOPSY N/A 07/26/2020   Procedure: VULVAR BIOPSY;  Surgeon: Lafonda Mosses, MD;  Location: [facility];  Service: Gynecology;  Laterality: N/A;   YAG LASER APPLICATION Right 51/70/0174   Procedure: YAG LASER APPLICATION;  Surgeon: Elta Guadeloupe T. Gershon Crane, MD;  Location: [facility];  Service: Ophthalmology;  Laterality: Right;  pt knows to arrive at 94:49   YAG LASER APPLICATION Left 67/59/1638   Procedure: YAG LASER APPLICATION;  Surgeon: Rutherford Guys, MD;  Location: [facility];  Service: Ophthalmology;  Laterality: Left;    Current Outpatient Medications  Medication Sig Dispense Refill   amiodarone (PACERONE) 200 MG tablet Take 1/2 (one-half) tablet by mouth once daily 45 tablet 3   Blood Glucose Monitoring Suppl (ACCU-CHEK AVIVA PLUS) w/Device KIT      cholecalciferol (VITAMIN D3) 25 MCG (1000 UNIT) tablet Take 1,000 Units by mouth daily.     clobetasol ointment  (TEMOVATE) 3.66 % Apply 1 application  topically 2 (two) times a week. ONLY as needed for itching on your vulva (Patient not taking: Reported on 09/23/2021) 30 g 0   dapagliflozin propanediol (FARXIGA) 10 MG TABS tablet Take 1 tablet (10 mg total) by mouth daily before breakfast. 30 tablet 2   ELIQUIS 5 MG TABS tablet Take 1 tablet by mouth twice daily 180 tablet 0   fenofibrate 160 MG tablet Take 160 mg by mouth daily with supper.      hydrocortisone (ANUSOL-HC) 25 MG suppository Place 1 suppository (25 mg total) rectally 2 (two) times daily. (Patient not taking: Reported on 09/23/2021) 12 suppository 0   insulin aspart protamine- aspart (NOVOLOG MIX 70/30) (70-30) 100 UNIT/ML injection Inject 35-45 Units into the skin See admin instructions. Inject 45 units into the skin with breakfast, 35 units with supper (may adjust based on blood sugar readings)     isosorbide mononitrate (IMDUR) 60 MG 24 hr tablet Take 1 tablet by mouth once daily 90 tablet 0   levothyroxine (SYNTHROID) 100 MCG tablet Take 100 mcg by mouth daily.     lidocaine (XYLOCAINE) 5 % ointment Apply 1 application topically 3 (three) times daily as needed. (Patient not taking: Reported on 09/23/2021) 35.44 g 2   metoprolol tartrate (LOPRESSOR) 50 MG tablet Take 1 tablet by mouth twice daily 180 tablet 0   nitroGLYCERIN (NITROSTAT) 0.4 MG SL tablet Place 1 tablet (0.4 mg total) under the tongue every 5 (five) minutes as needed for chest pain. 25 tablet 1   olmesartan-hydrochlorothiazide (BENICAR HCT) 40-25 MG tablet Take 1 tablet by mouth once daily 90 tablet 0   psyllium (METAMUCIL) 58.6 % packet Take 1 packet by mouth daily.     spironolactone (ALDACTONE) 25 MG tablet Take 1 tablet by mouth once daily 90 tablet 3   SURE COMFORT INSULIN SYRINGE 31G X 5/16" 0.5 ML MISC      torsemide (DEMADEX) 10 MG tablet Take 1 tablet (10 mg total) by mouth as needed. 30 tablet 3   traMADol (ULTRAM) 50 MG tablet Take 1 tablet (50 mg total) by mouth every  6 (six) hours as needed for severe pain. Do not take and drive (Patient not taking: Reported on 09/23/2021) 30 tablet 0   No current facility-administered medications for this visit.    Allergies as of 10/14/2021 - Review Complete 07/31/2021  Allergen Reaction Noted   Hydrocodone Other (See Comments) 04/29/2015   Invokana [canagliflozin] Palpitations and Other (See Comments) 04/29/2015   Oxycodone Other (See Comments) 04/29/2015    Family History  Problem Relation Age of Onset   Arthritis Mother    Diabetes Mother    Heart disease Mother    Hyperlipidemia Mother    Hypertension Mother    Arthritis Father    Asthma Father    Heart attack Father    Hyperlipidemia Father    Hypertension Father    Stroke Father    Arthritis Sister    Diabetes Sister    Hypertension Sister    Arthritis Sister  Diabetes Sister    Hyperlipidemia Sister    Hypertension Sister    Stroke Sister    Ovarian cancer Sister 32   Pancreatic cancer Sister 69   Uterine cancer Sister 67   Hyperlipidemia Sister    Hypertension Sister    Arthritis Brother    Heart attack Brother    Heart disease Brother    Hyperlipidemia Brother    Hypertension Brother    Alcohol abuse Brother    Arthritis Brother    Diabetes Brother    Early death Brother    Heart disease Brother    Hyperlipidemia Brother    Hypertension Brother    Cancer Maternal Aunt 70       unknown type   Throat cancer Maternal Grandfather        hx smoking/drinking   Hypertension Daughter    Cancer Daughter        "female cancer cells"   Kidney cancer Nephew        dx 67s   Colon cancer Neg Hx    Breast cancer Neg Hx    Prostate cancer Neg Hx     Social History   Socioeconomic History   Marital status: Widowed    Spouse name: Not on file   Number of children: 3   Years of education: Not on file   Highest education level: Not on file  Occupational History   Not on file  Tobacco Use   Smoking status: Never   Smokeless  tobacco: Never  Vaping Use   Vaping Use: Never used  Substance and Sexual Activity   Alcohol use: No    Alcohol/week: 0.0 standard drinks of alcohol   Drug use: No   Sexual activity: Not Currently    Birth control/protection: Post-menopausal  Other Topics Concern   Not on file  Social History Narrative   Husband recently passed away on 05-13-2020.   Social Determinants of Health   Financial Resource Strain: Not on file  Food Insecurity: Not on file  Transportation Needs: Not on file  Physical Activity: Not on file  Stress: Not on file  Social Connections: Not on file  Intimate Partner Violence: Not on file    Review of Systems: Gen: Denies any fever, chills, cold or flu like symptoms, presyncope, syncope.  CV: Denies chest pain, heart palpitations.  Resp: Denies shortness of breath, cough.  GI: See HPI GU : Denies urinary burning, urinary frequency, urinary hesitancy MS: Denies joint pain. Derm: Denies rash. Psych: Denies depression, anxiety. Heme: See HPI  Physical Exam: There were no vitals taken for this visit. General:   Alert and oriented. Pleasant and cooperative. Well-nourished and well-developed.  Head:  Normocephalic and atraumatic. Eyes:  Without icterus, sclera clear and conjunctiva pink.  Ears:  Normal auditory acuity. Lungs:  Clear to auscultation bilaterally. No wheezes, rales, or rhonchi. No distress.  Heart:  S1, S2 present without murmurs appreciated.  Abdomen:  +BS, soft, non-tender and non-distended. No HSM noted. No guarding or rebound. No masses appreciated.  Rectal:  Deferred  Msk:  Symmetrical without gross deformities. Normal posture. Extremities:  Without edema. Neurologic:  Alert and  oriented x4;  grossly normal neurologically. Skin:  Intact without significant lesions or rashes. Psych:  Normal mood and affect.    Assessment:     Plan:  ***   Aliene Altes, PA-C [facility] 10/14/2021

## 2021-10-14 ENCOUNTER — Ambulatory Visit (INDEPENDENT_AMBULATORY_CARE_PROVIDER_SITE_OTHER): Payer: Medicare HMO | Admitting: Gastroenterology

## 2021-10-14 ENCOUNTER — Telehealth: Payer: Self-pay | Admitting: *Deleted

## 2021-10-14 ENCOUNTER — Encounter: Payer: Self-pay | Admitting: Gastroenterology

## 2021-10-14 VITALS — BP 161/68 | HR 60 | Temp 97.9°F | Ht 62.0 in | Wt 193.4 lb

## 2021-10-14 DIAGNOSIS — R159 Full incontinence of feces: Secondary | ICD-10-CM

## 2021-10-14 DIAGNOSIS — Z1211 Encounter for screening for malignant neoplasm of colon: Secondary | ICD-10-CM | POA: Diagnosis not present

## 2021-10-14 DIAGNOSIS — R195 Other fecal abnormalities: Secondary | ICD-10-CM | POA: Diagnosis not present

## 2021-10-14 NOTE — Progress Notes (Signed)
Called pt to inform her about duplex results. Pt understood

## 2021-10-14 NOTE — Telephone Encounter (Signed)
OK to proceed with scheduling colonoscopy as planned.

## 2021-10-14 NOTE — Telephone Encounter (Signed)
noted 

## 2021-10-14 NOTE — Telephone Encounter (Signed)
Patient can be taken up for colonoscopy with low risk from cardiac standpoint.   Hole Eliquis for 2 days and restart same day if no biopsy otherwise in 2-5 days

## 2021-10-14 NOTE — Telephone Encounter (Signed)
  What type of surgery is being performed? Colonoscopy  When is surgery scheduled? TBD  Clearance to hold Eliquis 48 hours for procedure  Name of physician performing surgery?  Dr. Holland [facility] Phone: [phone] Fax: [phone]  Anethesia type (none, local, MAC, general)? MAC

## 2021-10-14 NOTE — Progress Notes (Signed)
Very minimal carotid disease. No change in therapy for now

## 2021-10-14 NOTE — Patient Instructions (Addendum)
I suspect your diarrhea is secondary to your prior radiation therapy, but I would like for you to complete stool studies to rule out infectious causes. You can have this completed at [facility].   We will arrange for you to have a colonoscopy in the near future with Dr. Gala Romney to further evaluate your diarrhea.   Try increasing Metamucil to 2 capsules in the evening.  After 2 weeks, if you are tolerating this well, you can increase this to 2 capsules twice daily. The goal of this is to improve your stool consistency.   You may use Imodium to help control your loose stools.  Recommend taking 2 imodium after your first loose bowel movement, 1 imodium after each additional loose bowel movement up to a total of 4 capsules daily.  We will see you back after your procedure.   It was nice to meet you today!   Aliene Altes, PA-C [facility]

## 2021-10-16 ENCOUNTER — Telehealth: Payer: Self-pay | Admitting: *Deleted

## 2021-10-16 ENCOUNTER — Encounter: Payer: Self-pay | Admitting: *Deleted

## 2021-10-16 MED ORDER — PEG 3350-KCL-NA BICARB-NACL 420 G PO SOLR: 4000.0000 mL | Freq: Once | ORAL | 0 refills | Status: AC

## 2021-10-16 NOTE — Telephone Encounter (Signed)
Called pt. Scheduled for TCS with Dr. Gala Romney, ASA 3 on 11/8 at 8:15am. Aware will mail prep instructions/pre-op appt. Rx for prep sent to pharmacy. Aware to hold eliquis 48 hrs prior.

## 2021-11-01 DIAGNOSIS — I5033 Acute on chronic diastolic (congestive) heart failure: Secondary | ICD-10-CM | POA: Diagnosis not present

## 2021-11-01 DIAGNOSIS — J029 Acute pharyngitis, unspecified: Secondary | ICD-10-CM | POA: Diagnosis not present

## 2021-11-01 DIAGNOSIS — J069 Acute upper respiratory infection, unspecified: Secondary | ICD-10-CM | POA: Diagnosis not present

## 2021-11-01 DIAGNOSIS — J209 Acute bronchitis, unspecified: Secondary | ICD-10-CM | POA: Diagnosis not present

## 2021-11-01 DIAGNOSIS — R069 Unspecified abnormalities of breathing: Secondary | ICD-10-CM | POA: Diagnosis not present

## 2021-11-04 DIAGNOSIS — E039 Hypothyroidism, unspecified: Secondary | ICD-10-CM | POA: Diagnosis not present

## 2021-11-12 DIAGNOSIS — I129 Hypertensive chronic kidney disease with stage 1 through stage 4 chronic kidney disease, or unspecified chronic kidney disease: Secondary | ICD-10-CM | POA: Diagnosis not present

## 2021-11-12 DIAGNOSIS — E1122 Type 2 diabetes mellitus with diabetic chronic kidney disease: Secondary | ICD-10-CM | POA: Diagnosis not present

## 2021-11-12 DIAGNOSIS — N183 Chronic kidney disease, stage 3 unspecified: Secondary | ICD-10-CM | POA: Diagnosis not present

## 2021-11-15 NOTE — Patient Instructions (Signed)
[name]  11/15/2021     '@[email or web address]'$ @   Your procedure is scheduled on Wednesday, November 8.  Report to [facility] at [phone] A.M.  Call this number if you have problems the morning of surgery:  [phone]  If you experience any cold or flu symptoms such as cough, fever, chills, shortness of breath, etc. between now and your scheduled surgery, please notify us at the above number.   Remember:  Do not eat or drink after midnight.      Take these medicines the morning of surgery with A SIP OF WATER amiodarone, levothyroxine, metoprolol, tramadol if needed. Regarding 70/30 insulin on 11/7: take 18 units in the am and 15 units in the evening. Take none the am of procedure and no other diabetic meds.    Do not wear jewelry, make-up or nail polish.  Do not wear lotions, powders, or perfumes, or deodorant.  Do not shave 48 hours prior to surgery.  Men may shave face and neck.  Do not bring valuables to the hospital.  [facility] is not responsible for any belongings or valuables.  Contacts, dentures or bridgework may not be worn into surgery.  Leave your suitcase in the car.  After surgery it may be brought to your room.  For patients admitted to the hospital, discharge time will be determined by your treatment team.  Patients discharged the day of surgery will not be allowed to drive home.   Name and phone number of your driver:   driver Special instructions:  Follow diet and prep instructions mailed to you by the office.  Please read over the following fact sheets that you were given. Anesthesia Post-op Instructions and Care and Recovery After Surgery      a The following information offers guidance on how to care for yourself after your procedure. Your health care provider may also give you more specific instructions. If you have problems or questions, contact your health care provider. What can I expect after the procedure? After the procedure, it is common to  have: A small amount of blood in your stool for 24 hours after the procedure. Some gas. Mild cramping or bloating of your abdomen. Follow these instructions at home: Eating and drinking  Drink enough fluid to keep your urine pale yellow. Follow instructions from your health care provider about eating or drinking restrictions. Resume your normal diet as told by your health care provider. Avoid heavy or fried foods that are hard to digest. Activity Rest as told by your health care provider. Avoid sitting for a long time without moving. Get up to take short walks every 1-2 hours. This is important to improve blood flow and breathing. Ask for help if you feel weak or unsteady. Return to your normal activities as told by your health care provider. Ask your health care provider what activities are safe for you. Managing cramping and bloating  Try walking around when you have cramps or feel bloated. If directed, apply heat to your abdomen as told by your health care provider. Use the heat source that your health care provider recommends, such as a moist heat pack or a heating pad. Place a towel between your skin and the heat source. Leave the heat on for 20-30 minutes. Remove the heat if your skin turns bright red. This is especially important if you are unable to feel pain, heat, or cold. You have a greater risk of getting burned. General instructions If you were given  a sedative during the procedure, it can affect you for several hours. Do not drive or operate machinery until your health care provider says that it is safe. For the first 24 hours after the procedure: Do not sign important documents. Do not drink alcohol. Do your regular daily activities at a slower pace than normal. Eat soft foods that are easy to digest. Take over-the-counter and prescription medicines only as told by your health care provider. Keep all follow-up visits. This is important. Contact a health care provider  if: You have blood in your stool 2-3 days after the procedure. Get help right away if: You have more than a small spotting of blood in your stool. You have large blood clots in your stool. You have swelling of your abdomen. You have nausea or vomiting. You have a fever. You have increasing pain in your abdomen that is not relieved with medicine. These symptoms may be an emergency. Get help right away. Call 911. Do not wait to see if the symptoms will go away. Do not drive yourself to the hospital. Summary After the procedure, it is common to have a small amount of blood in your stool. You may also have mild cramping and bloating of your abdomen. If you were given a sedative during the procedure, it can affect you for several hours. Do not drive or operate machinery until your health care provider says that it is safe. Get help right away if you have a lot of blood in your stool, nausea or vomiting, a fever, or increased pain in your abdomen. This information is not intended to replace advice given to you by your health care provider. Make sure you discuss any questions you have with your health care provider. Document Revised: 08/22/2020 Document Reviewed: 08/22/2020 Elsevier Patient Education  [place]. Colonoscopy, Adult A colonoscopy is a procedure to look at the entire large intestine. This procedure is done using a long, thin, flexible tube that has a camera on the end. You may have a colonoscopy: As a part of normal colorectal screening. If you have certain symptoms, such as: A low number of red blood cells in your blood (anemia). Diarrhea that does not go away. Pain in your abdomen. Blood in your stool. A colonoscopy can help screen for and diagnose medical problems, including: An abnormal growth of cells or tissue (tumor). Abnormal growths within the lining of your intestine (polyps). Inflammation. Areas of bleeding. Tell your health care provider about: Any  allergies you have. All medicines you are taking, including vitamins, herbs, eye drops, creams, and over-the-counter medicines. Any problems you or family members have had with anesthetic medicines. Any bleeding problems you have. Any surgeries you have had. Any medical conditions you have. Any problems you have had with having bowel movements. Whether you are pregnant or may be pregnant. What are the risks? Generally, this is a safe procedure. However, problems may occur, including: Bleeding. Damage to your intestine. Allergic reactions to medicines given during the procedure. Infection. This is rare. What happens before the procedure? Eating and drinking restrictions Follow instructions from your health care provider about eating or drinking restrictions, which may include: A few days before the procedure: Follow a low-fiber diet. Avoid nuts, seeds, dried fruit, raw fruits, and vegetables. 1-3 days before the procedure: Eat only gelatin dessert or ice pops. Drink only clear liquids, such as water, clear juice, clear broth or bouillon, black coffee or tea, or clear soft drinks or sports drinks. Avoid liquids  that contain red or purple dye. The day of the procedure: Do not eat solid foods. You may continue to drink clear liquids until up to 2 hours before the procedure. Do not eat or drink anything starting 2 hours before the procedure, or within the time period that your health care provider recommends. Bowel prep If you were prescribed a bowel prep to take by mouth (orally) to clean out your colon: Take it as told by your health care provider. Starting the day before your procedure, you will need to drink a large amount of liquid medicine. The liquid will cause you to have many bowel movements of loose stool until your stool becomes almost clear or light green. If your skin or the opening between the buttocks (anus) gets irritated from diarrhea, you may relieve the irritation  using: Wipes with medicine in them, such as adult wet wipes with aloe and vitamin E. A product to soothe skin, such as petroleum jelly. If you vomit while drinking the bowel prep: Take a break for up to 60 minutes. Begin the bowel prep again. Call your health care provider if you keep vomiting or you cannot take the bowel prep without vomiting. To clean out your colon, you may also be given: Laxative medicines. These help you have a bowel movement. Instructions for enema use. An enema is liquid medicine injected into your rectum. Medicines Ask your health care provider about: Changing or stopping your regular medicines or supplements. This is especially important if you are taking iron supplements, diabetes medicines, or blood thinners. Taking medicines such as aspirin and ibuprofen. These medicines can thin your blood. Do not take these medicines unless your health care provider tells you to take them. Taking over-the-counter medicines, vitamins, herbs, and supplements. General instructions Ask your health care provider what steps will be taken to help prevent infection. These may include washing skin with a germ-killing soap. If you will be going home right after the procedure, plan to have a responsible adult: Take you home from the hospital or clinic. You will not be allowed to drive. Care for you for the time you are told. What happens during the procedure?  An IV will be inserted into one of your veins. You will be given a medicine to make you fall asleep (general anesthetic). You will lie on your side with your knees bent. A lubricant will be put on the tube. Then the tube will be: Inserted into your anus. Gently eased through all parts of your large intestine. Air will be sent into your colon to keep it open. This may cause some pressure or cramping. Images will be taken with the camera and will appear on a screen. A small tissue sample may be removed to be looked at under a  microscope (biopsy). The tissue may be sent to a lab for testing if any signs of problems are found. If small polyps are found, they may be removed and checked for cancer cells. When the procedure is finished, the tube will be removed. The procedure may vary among health care providers and hospitals. What happens after the procedure? Your blood pressure, heart rate, breathing rate, and blood oxygen level will be monitored until you leave the hospital or clinic. You may have a small amount of blood in your stool. You may pass gas and have mild cramping or bloating in your abdomen. This is caused by the air that was used to open your colon during the exam. If you were given  a sedative during the procedure, it can affect you for several hours. Do not drive or operate machinery until your health care provider says that it is safe. It is up to you to get the results of your procedure. Ask your health care provider, or the department that is doing the procedure, when your results will be ready. Summary A colonoscopy is a procedure to look at the entire large intestine. Follow instructions from your health care provider about eating and drinking before the procedure. If you were prescribed an oral bowel prep to clean out your colon, take it as told by your health care provider. During the colonoscopy, a flexible tube with a camera on its end is inserted into the anus and then passed into all parts of the large intestine. This information is not intended to replace advice given to you by your health care provider. Make sure you discuss any questions you have with your health care provider. Document Revised: 12/24/2020 Document Reviewed: 08/22/2020 Elsevier Patient Education  [place] After The following information offers guidance on how to care for yourself after your procedure. Your health care provider may also give you more specific instructions. If you have  problems or questions, contact your health care provider. What can I expect after the procedure? After the procedure, it is common to have: Tiredness. Little or no memory about what happened during or after the procedure. Impaired judgment when it comes to making decisions. Nausea or vomiting. Some trouble with balance. Follow these instructions at home: For the time period you were told by your health care provider:  Rest. Do not participate in activities where you could fall or become injured. Do not drive or use machinery. Do not drink alcohol. Do not take sleeping pills or medicines that cause drowsiness. Do not make important decisions or sign legal documents. Do not take care of children on your own. Medicines Take over-the-counter and prescription medicines only as told by your health care provider. If you were prescribed antibiotics, take them as told by your health care provider. Do not stop using the antibiotic even if you start to feel better. Eating and drinking Follow instructions from your health care provider about what you may eat and drink. Drink enough fluid to keep your urine pale yellow. If you vomit: Drink clear fluids slowly and in small amounts as you are able. Clear fluids include water, ice chips, low-calorie sports drinks, and fruit juice that has water added to it (diluted fruit juice). Eat light and bland foods in small amounts as you are able. These foods include bananas, applesauce, rice, lean meats, toast, and crackers. General instructions  Have a responsible adult stay with you for the time you are told. It is important to have someone help care for you until you are awake and alert. If you have sleep apnea, surgery and some medicines can increase your risk for breathing problems. Follow instructions from your health care provider about wearing your sleep device: When you are sleeping. This includes during daytime naps. While taking prescription pain  medicines, sleeping medicines, or medicines that make you drowsy. Do not use any products that contain nicotine or tobacco. These products include cigarettes, chewing tobacco, and vaping devices, such as e-cigarettes. If you need help quitting, ask your health care provider. Contact a health care provider if: You feel nauseous or vomit every time you eat or drink. You feel light-headed. You are still sleepy or having trouble with  balance after 24 hours. You get a rash. You have a fever. You have redness or swelling around the IV site. Get help right away if: You have trouble breathing. You have new confusion after you get home. These symptoms may be an emergency. Get help right away. Call 911. Do not wait to see if the symptoms will go away. Do not drive yourself to the hospital. This information is not intended to replace advice given to you by your health care provider. Make sure you discuss any questions you have with your health care provider. Document Revised: 05/27/2021 Document Reviewed: 05/27/2021 Elsevier Patient Education  [place].

## 2021-11-18 ENCOUNTER — Encounter (HOSPITAL_COMMUNITY)
Admission: RE | Admit: 2021-11-18 | Discharge: 2021-11-18 | Disposition: A | Discharge status: Home / Self Care | Payer: Medicare HMO | Source: Ambulatory Visit | Attending: Internal Medicine | Admitting: Internal Medicine

## 2021-11-18 ENCOUNTER — Encounter (HOSPITAL_COMMUNITY): Payer: Self-pay

## 2021-11-18 ENCOUNTER — Other Ambulatory Visit: Payer: Self-pay | Admitting: Cardiology

## 2021-11-18 VITALS — BP 137/48 | HR 60 | Temp 97.8°F | Resp 18 | Ht 62.0 in | Wt 193.3 lb

## 2021-11-18 DIAGNOSIS — Z45018 Encounter for adjustment and management of other part of cardiac pacemaker: Secondary | ICD-10-CM | POA: Diagnosis not present

## 2021-11-18 DIAGNOSIS — Z01812 Encounter for preprocedural laboratory examination: Secondary | ICD-10-CM | POA: Insufficient documentation

## 2021-11-18 DIAGNOSIS — Z01818 Encounter for other preprocedural examination: Secondary | ICD-10-CM

## 2021-11-18 DIAGNOSIS — I495 Sick sinus syndrome: Secondary | ICD-10-CM | POA: Diagnosis not present

## 2021-11-18 HISTORY — DX: Acute myocardial infarction, unspecified: I21.9

## 2021-11-18 LAB — BASIC METABOLIC PANEL
Anion gap: 9 (ref 5–15)
BUN: 32 mg/dL — ABNORMAL HIGH (ref 8–23)
CO2: 26 mmol/L (ref 22–32)
Calcium: 10 mg/dL (ref 8.9–10.3)
Chloride: 102 mmol/L (ref 98–111)
Creatinine, Ser: 1.82 mg/dL — ABNORMAL HIGH (ref 0.44–1.00)
GFR, Estimated: 28 mL/min — ABNORMAL LOW (ref >60)
Glucose, Bld: 226 mg/dL — ABNORMAL HIGH (ref 70–99)
Potassium: 4.8 mmol/L (ref 3.5–5.1)
Sodium: 137 mmol/L (ref 135–145)

## 2021-11-18 NOTE — Progress Notes (Signed)
Patient will need cardiac clearance before having procedure.  We will cancel her for now and reschedule after clearance from [facility].

## 2021-11-18 NOTE — Progress Notes (Deleted)
Primary Physician/Referring:  Deland Pretty, MD  Patient ID: [name], female    DOB: 11-08-1945, 76 y.o.   MRN: [id number]  No chief complaint on file.   HPI:    [name]  is a 76 y.o. female  with hypertension, type 2 diabetes mellitus with stage 3 CKD, chronic diastolic heart failure, mild hyperlipidemia on Tricor, nonobstructive CAD (Cath 2019), sick sinus syndrome status post dual-chamber pacemaker 2017, OSA on BiPAP, paroxysmal Afib and atrial flutter/atrial tachycardia.  Her last cardioversion was 09/07/2018 for atrial flutter.  Patient was diagnosed with squamous cell carcinoma of the vulva and underwent colectomy 08/22/2020 at [facility].  Postoperatively patient experienced complex complications including hypotension, hypoxic respiratory failure/pulmonary edema, surgical site infection, acute on chronic CKD.  Patient was discharged home with new supplemental oxygen requirement of 3 L.  During postoperative period patient experienced hypotension requiring pressor support.  She subsequently developed acute on chronic diastolic heart failure requiring diuresis.  Patient was seen in outpatient follow-up, uptitrated guideline directed medical therapy and patient clinically improved.  Patient was last seen in the office 07/09/2021 given hypervolemia advised patient to take torsemide 10 mg p.o. daily for 3 to 5 days and ordered repeat BMP and BMP.  Unfortunately repeat laboratory testing has not been done.  She now presents for follow-up.  Patient completed 3-day course of torsemide following last office visit and dyspnea significantly improved.  She has been feeling well overall since then.  Reports home blood pressure readings averaging 140s/50s mmHg.  She unfortunately has not had repeat labs done but agrees to do this in the next 2 days. Notably patient is not taking Wilder Glade due to cost concerns, she is working on applying for patient assistance program.  Past Medical History:  Diagnosis  Date   Arthritis    CAD (coronary artery disease)    Diabetes mellitus without complication ([place])    Diastolic heart failure ([place])    Dyspnea    Encounter for care of pacemaker 09/03/2018   Family history of kidney cancer    Family history of throat cancer    Family history of uterine cancer    History of chickenpox    History of radiation therapy    Pelvis 10/23/20-12/14/20- Dr. Gery Pray   Hx of psoriatic arthritis    Hyperlipemia    Hypertension    Hypertension 05/03/2018   Hypothyroidism    Mitral valve disorders(424.0)    Myocardial infarction ([place])    Obstructive sleep apnea    OSA (obstructive sleep apnea)    using BiPAP   Pacemaker    Paroxysmal atrial fibrillation ([place]) 10/11/2016   Peripheral neuropathy    Renal disorder Kidney disease stage2   Sick sinus syndrome [facility])    S/p dual-chamber pacemaker   Thyroid disease hypothyroid   Urine incontinence    Vitamin D deficiency    Vulvar cancer ([place]) 2022   s/p surgery and radiation therapy October-December 2022   Past Surgical History:  Procedure Laterality Date   BACK SURGERY     BACK SURGERY     CARDIAC CATHETERIZATION N/A 04/29/2015   Procedure: Temporary Pacemaker;  Surgeon: Adrian Prows, MD;  Location: [place] [facility];  Service: Cardiovascular;  Laterality: N/A;   CARDIOVERSION N/A 09/07/2018   Procedure: CARDIOVERSION;  Surgeon: Adrian Prows, MD;  Location: [place];  Service: Cardiovascular;  Laterality: N/A;   CARDIOVERSION     COLONOSCOPY  08/16/2008   Dr. Gala Romney; pancolonic diverticulosis, otherwise no abnormalities.  Consider repeat in 10 years.   EP IMPLANTABLE DEVICE N/A 04/30/2015   Procedure: Pacemaker Implant;  Surgeon: Evans Lance, MD;  Location: [place] [facility];  Service: Cardiovascular;  Laterality: N/A;   INTRAVASCULAR PRESSURE WIRE/FFR STUDY N/A 08/31/2017   Procedure: INTRAVASCULAR PRESSURE WIRE/FFR STUDY;  Surgeon: Nigel Mormon, MD;  Location: [place] [facility];   Service: Cardiovascular;  Laterality: N/A;   LEFT AND RIGHT HEART CATHETERIZATION WITH CORONARY ANGIOGRAM N/A 09/30/2011   Procedure: LEFT AND RIGHT HEART CATHETERIZATION WITH CORONARY ANGIOGRAM;  Surgeon: Laverda Page, MD;  Location: [facility] LAB;  Service: Cardiovascular;  Laterality: N/A;   RIGHT/LEFT HEART CATH AND CORONARY ANGIOGRAPHY N/A 08/31/2017   Procedure: RIGHT/LEFT HEART CATH AND CORONARY ANGIOGRAPHY;  Surgeon: Nigel Mormon, MD;  Location: [place] [facility];  Service: Cardiovascular;  Laterality: N/A;   TOOTH EXTRACTION  10/07/2018   TUBAL LIGATION     VULVA Milagros Loll BIOPSY N/A 07/26/2020   Procedure: VULVAR BIOPSY;  Surgeon: Lafonda Mosses, MD;  Location: [facility];  Service: Gynecology;  Laterality: N/A;   YAG LASER APPLICATION Right 38/93/7342   Procedure: YAG LASER APPLICATION;  Surgeon: Elta Guadeloupe T. Gershon Crane, MD;  Location: [facility];  Service: Ophthalmology;  Laterality: Right;  pt knows to arrive at 87:68   YAG LASER APPLICATION Left 11/57/2620   Procedure: YAG LASER APPLICATION;  Surgeon: Rutherford Guys, MD;  Location: [facility];  Service: Ophthalmology;  Laterality: Left;   Family History  Problem Relation Age of Onset   Arthritis Mother    Diabetes Mother    Heart disease Mother    Hyperlipidemia Mother    Hypertension Mother    Arthritis Father    Asthma Father    Heart attack Father    Hyperlipidemia Father    Hypertension Father    Stroke Father    Arthritis Sister    Diabetes Sister    Hypertension Sister    Arthritis Sister    Diabetes Sister    Hyperlipidemia Sister    Hypertension Sister    Stroke Sister    Ovarian cancer Sister 4   Pancreatic cancer Sister 34   Uterine cancer Sister 76   Hyperlipidemia Sister    Hypertension Sister    Arthritis Brother    Heart attack Brother    Heart disease Brother    Hyperlipidemia Brother    Hypertension Brother    Alcohol abuse Brother    Arthritis Brother    Diabetes Brother    Early death  Brother    Heart disease Brother    Hyperlipidemia Brother    Hypertension Brother    Cancer Maternal Aunt 70       unknown type   Throat cancer Maternal Grandfather        hx smoking/drinking   Hypertension Daughter    Cancer Daughter        "female cancer cells"   Kidney cancer Nephew        dx 48s   Colon cancer Neg Hx    Breast cancer Neg Hx    Prostate cancer Neg Hx     Social History   Tobacco Use   Smoking status: Never   Smokeless tobacco: Never  Substance Use Topics   Alcohol use: No    Alcohol/week: 0.0 standard drinks of alcohol   Marital Status: Married  ROS  Review of Systems  Cardiovascular:  Negative for chest pain, dyspnea on exertion and leg swelling.   Objective  There  were no vitals taken for this visit.     11/18/2021   10:31 AM 10/14/2021   11:23 AM 09/23/2021    2:43 PM  Vitals with BMI  Height _0  _1  5' 1.417"  Weight 193 lbs 5 oz 193 lbs 6 oz 187 lbs  BMI 35.35 53.64 68.03  Systolic 212 248 250  Diastolic 48 68 54  Pulse 60 60 62     Physical Exam Vitals reviewed.  Constitutional:      Appearance: She is well-developed. She is obese.  Neck:     Vascular: Carotid bruit (left) present. No JVD.  Cardiovascular:     Rate and Rhythm: Normal rate and regular rhythm.     Pulses: Intact distal pulses.          Popliteal pulses are 1+ on the right side and 1+ on the left side.       Dorsalis pedis pulses are 1+ on the right side and 1+ on the left side.       Posterior tibial pulses are 1+ on the right side and 1+ on the left side.     Heart sounds: S1 normal and S2 normal. Murmur heard.     Midsystolic murmur is present with a grade of 2/6 at the upper right sternal border and apex.     No gallop.  Pulmonary:     Effort: Pulmonary effort is normal. No accessory muscle usage or respiratory distress.     Breath sounds: Normal breath sounds. No wheezing, rhonchi or rales.  Abdominal:     General: Bowel sounds are normal.     Palpations:  Abdomen is soft.  Musculoskeletal:     Right lower leg: No edema.     Left lower leg: No edema.    Laboratory examination:   Recent Labs    08/01/21 0851 08/15/21 0953 11/18/21 1042  NA 138 137 137  K 5.8* 4.8 4.8  CL 99 99 102  CO2 _2 GLUCOSE 142* 168* 226*  BUN 20 27 32*  CREATININE 1.53* 1.52* 1.82*  CALCIUM 10.8* 10.5* 10.0  GFRNONAA  --   --  28*    estimated creatinine clearance is 26.2 mL/min (A) (by C-G formula based on SCr of 1.82 mg/dL (H)).     Latest Ref Rng & Units 11/18/2021   10:42 AM 08/15/2021    9:53 AM 08/01/2021    8:51 AM  CMP  Glucose 70 - 99 mg/dL 226  168  142   BUN 8 - 23 mg/dL 32  27  20   Creatinine 0.44 - 1.00 mg/dL 1.82  1.52  1.53   Sodium 135 - 145 mmol/L 137  137  138   Potassium 3.5 - 5.1 mmol/L 4.8  4.8  5.8   Chloride 98 - 111 mmol/L 102  99  99   CO2 22 - 32 mmol/L _3 Calcium 8.9 - 10.3 mg/dL 10.0  10.5  10.8       Latest Ref Rng & Units 09/03/2020   12:24 PM 07/26/2020   11:15 AM 08/26/2019   10:55 AM  CBC  WBC 4.0 - 10.5 K/uL 8.1  7.9  7.1   Hemoglobin 12.0 - 15.0 g/dL 9.9  13.4  13.6   Hematocrit 36.0 - 46.0 % 31.0  41.7  43.0   Platelets 150 - 400 K/uL 223  148  155    External labs:   Labs 06/26/2021:  Sodium 140, potassium 4.8,  TSH 10.20 (0.43-5.25).  Vitamin D20.2.  Labs 09/04/2021:  Total cholesterol 173, triglycerides 227, HDL 26, LDL 107.  A1c 6.9%.  Serum glucose 128, BUN 24, creatinine 1.52, EGFR 35 mill, potassium 4.5, LFTs normal.  Labs 06/25/2021:  Hb 13.5/HCT 41.4, platelets 163, normal indicis.  Allergies   Allergies  Allergen Reactions   Hydrocodone Other (See Comments)    Lethargic    Invokana [Canagliflozin] Palpitations and Other (See Comments)    Made heart race   Oxycodone Other (See Comments)    Lethargic      Final Medications at End of Visit    Current Outpatient Medications:    acetaminophen (TYLENOL) 500 MG tablet, Take 500-1,000 mg by mouth every 6 (six)  hours as needed (pain.)., Disp: , Rfl:    amiodarone (PACERONE) 200 MG tablet, Take 1/2 (one-half) tablet by mouth once daily, Disp: 45 tablet, Rfl: 3   Blood Glucose Monitoring Suppl (ACCU-CHEK AVIVA PLUS) w/Device KIT, , Disp: , Rfl:    cefdinir (OMNICEF) 300 MG capsule, Take 300 mg by mouth 2 (two) times daily., Disp: , Rfl:    cholecalciferol (VITAMIN D3) 25 MCG (1000 UNIT) tablet, Take 1,000 Units by mouth in the morning., Disp: , Rfl:    clobetasol ointment (TEMOVATE) 9.67 %, Apply 1 application  topically 2 (two) times a week. ONLY as needed for itching on your vulva (Patient not taking: Reported on 09/23/2021), Disp: 30 g, Rfl: 0   diphenhydramine-acetaminophen (TYLENOL PM) 25-500 MG TABS tablet, Take 1 tablet by mouth at bedtime as needed (sleep)., Disp: , Rfl:    ELIQUIS 5 MG TABS tablet, Take 1 tablet by mouth twice daily, Disp: 180 tablet, Rfl: 0   FARXIGA 10 MG TABS tablet, Take 1 tablet (10 mg total) by mouth daily before breakfast., Disp: 90 tablet, Rfl: 0   fenofibrate 160 MG tablet, Take 160 mg by mouth daily with supper. , Disp: , Rfl:    GAVILYTE-G 236 g solution, Take 4,000 mLs by mouth once., Disp: , Rfl:    insulin aspart protamine- aspart (NOVOLOG MIX 70/30) (70-30) 100 UNIT/ML injection, Inject 30-36 Units into the skin See admin instructions. Inject 36 units subcutaneously with breakfast & inject 30 units subcutaneously with supper (may adjust based on blood sugar readings), Disp: , Rfl:    isosorbide mononitrate (IMDUR) 60 MG 24 hr tablet, Take 1 tablet by mouth once daily (Patient taking differently: Take 60 mg by mouth every evening.), Disp: 90 tablet, Rfl: 0   levothyroxine (SYNTHROID) 125 MCG tablet, Take 125 mcg by mouth daily before breakfast., Disp: , Rfl:    metoprolol tartrate (LOPRESSOR) 50 MG tablet, Take 1 tablet by mouth twice daily, Disp: 180 tablet, Rfl: 0   neomycin-bacitracin-polymyxin [phone] OINT, Apply 1 Application topically 3 (three) times daily as  needed (wound care)., Disp: , Rfl:    nitroGLYCERIN (NITROSTAT) 0.4 MG SL tablet, Place 1 tablet (0.4 mg total) under the tongue every 5 (five) minutes as needed for chest pain., Disp: 25 tablet, Rfl: 1   olmesartan-hydrochlorothiazide (BENICAR HCT) 40-25 MG tablet, Take 1 tablet by mouth once daily, Disp: 90 tablet, Rfl: 0   psyllium (METAMUCIL) 0.52 g capsule, Take 0.52 g by mouth every evening., Disp: , Rfl:    spironolactone (ALDACTONE) 25 MG tablet, Take 1 tablet by mouth once daily (Patient taking differently: Take 25 mg by mouth every evening.), Disp: 90 tablet, Rfl: 3   torsemide (DEMADEX) 10 MG tablet, Take 1 tablet (  10 mg total) by mouth as needed., Disp: 30 tablet, Rfl: 3   traMADol (ULTRAM) 50 MG tablet, Take 1 tablet (50 mg total) by mouth every 6 (six) hours as needed for severe pain. Do not take and drive (Patient taking differently: Take 50 mg by mouth daily as needed for severe pain. Do not take and drive), Disp: 30 tablet, Rfl: 0   Radiology:   No results found.  Cardiac Studies:    Lexiscan myoview stress test 06/15/2017: 1. Lexiscan stress test was performed. Exercise capacity was not assessed. Stress symptoms included dizziness. Peak blood pressure 158/66 mmHg. Stress EKG is non diagnostic for ischemia as it is a pharmacologic stress. In addition, it demonstrated atrial pacing and incomplete LBBB. 2. The overall quality of the study is excellent. There is no evidence of abnormal lung activity. Stress and rest SPECT images demonstrate homogeneous tracer distribution throughout the myocardium. Gated SPECT imaging reveals normal myocardial thickening and wall motion. The left ventricular ejection fraction was normal (71%). 3. Low risk study.   Sleep Study 08/11/2017: Positive for Complex Sleep Apnea; On CPAP    R&LHC 08/31/2017: LCx: Nondominant. AV grove LCx 50-60% diffuse disease. RCA: Large dominant. Ostial PDA 50% stenosis, FFR 0.96. No change from 09/30/2011. RA: 5  mmHg RV: 45/4 mmHg. PA: 40/12 mmHg. Mean PA 29 mmHg PCWP: 16 mmHg LV: 130/3 mmHg, LVEDP 13 mmHg CO: 3.6 L/min. CI 1.9 L/min/m2  PCV ECHOCARDIOGRAM COMPLETE 11/19/2020 Left ventricle cavity is normal in size. Moderate concentric hypertrophy of the left ventricle. Normal global wall motion. Normal LV systolic function with EF 55%. Diastolic function could not be assessed due to paced rhythm. Left atrial cavity is mildly dilated. Mild thickening of mitral valve. Mildly restricted mitral valve leaflets without definite stenosis.  Moderate (Grade III) mitral regurgitation. Mild tricuspid regurgitation. Estimated pulmonary artery systolic pressure 37 mmHg. Previous study in 2020 noted severe LA dilatation. No other significant change noted.  Carotid artery duplex 10/09/2021: Duplex suggests stenosis in the right internal carotid artery (1-15%). Duplex suggests stenosis in the left internal carotid artery (minimal). Antegrade right vertebral artery flow. Antegrade left vertebral artery flow. Comparison study done on 09/06/2020, in stenosis severity of 16 to 49% in the bilateral ICA.  No longer present.  Further studies if clinically indicated.  Pacemaker Medtronic Adapta 04/30/2015 :  Remote dual-chamber pacemaker transmission 11/18/2021: 99%, VP 11%.  Longevity 8 years, 0 months.  Lead impedance and thresholds are normal.  There were no high ventricular rate episodes, no high atrial rate episodes.  Normal device function.  EKG:  07/09/2021: Atrial paced rhythm with first-degree AV block at a rate of 60 bpm.  Left axis, left anterior fascicular block.  Poor impression, cannot exclude anteroseptal infarct old.  Compared to EKG 05/18/2020, no significant change.  11/10/2019: Atrially paced rhythm with first-degree AV block, ventricularly sensed.  Ventricular rate 60 bpm.  Left axis deviation, left intrafascicular block.  Anteroseptal infarct old.  IVCD, borderline criteria for LVH.  Nonspecific T  abnormality.  No significant change from 01/11/2019.  08/30/2018: Atypical atrial flutter with 3:1 conduction, V rate 110 bpm, left axis deviation. LBBB.   Assessment     ICD-10-CM   1. Coronary artery disease involving native coronary artery of native heart without angina pectoris  I25.10     2. Chronic diastolic heart failure (HCC)  I50.32     3. Essential hypertension  I10     4. Pacemaker  Medtronic  Medtronic Adapta Dr Peggyann Juba, leads only  MRI  in situ 04/30/2015  Z95.0       No orders of the defined types were placed in this encounter.   There are no discontinued medications.   This patients CHA2DS2-VASc Score 7 (CHF, HTN, DM, Vasc, Age, F) and yearly risk of stroke >9.8%.   Recommendations:   [name]  is a 76 y.o. female  with hypertension, type 2 diabetes mellitus with stage 3 CKD, chronic diastolic heart failure, mild hyperlipidemia on fenobibrate, nonobstructive CAD (Cath 2019), sick sinus syndrome status post dual-chamber pacemaker 2017, OSA on BiPAP, paroxysmal Afib and atrial flutter/atrial tachycardia S/P cardioversion was 09/07/2018 for atrial flutter.  1. Bilateral carotid artery stenosis ***  2. Sinus node dysfunction (HCC) ***  3. Chronic diastolic heart failure (HCC) ***  4. Pacemaker  Medtronic  Medtronic Adapta Dr Peggyann Juba, leads only MRI  in situ 04/30/2015 ***  5. Essential hypertension ***   Follow-up in 6 months, sooner if needed.    Adrian Prows, MD, [facility] 11/18/2021, 10:24 PM Office: [phone] Fax: [phone] Pager: [phone]

## 2021-11-19 ENCOUNTER — Ambulatory Visit: Payer: Medicare HMO | Admitting: Cardiology

## 2021-11-19 DIAGNOSIS — Z950 Presence of cardiac pacemaker: Secondary | ICD-10-CM

## 2021-11-19 DIAGNOSIS — I10 Essential (primary) hypertension: Secondary | ICD-10-CM

## 2021-11-19 DIAGNOSIS — I5032 Chronic diastolic (congestive) heart failure: Secondary | ICD-10-CM

## 2021-11-19 DIAGNOSIS — I495 Sick sinus syndrome: Secondary | ICD-10-CM

## 2021-11-19 DIAGNOSIS — I6523 Occlusion and stenosis of bilateral carotid arteries: Secondary | ICD-10-CM

## 2021-11-20 ENCOUNTER — Encounter (HOSPITAL_COMMUNITY)
Admission: RE | Disposition: A | Discharge status: Home / Self Care | Payer: Self-pay | Source: Home / Self Care | Attending: Internal Medicine

## 2021-11-20 ENCOUNTER — Ambulatory Visit (HOSPITAL_COMMUNITY)
Admission: RE | Admit: 2021-11-20 | Discharge: 2021-11-20 | Disposition: A | Discharge status: Home / Self Care | Payer: Medicare HMO | Attending: Internal Medicine | Admitting: Internal Medicine

## 2021-11-20 ENCOUNTER — Other Ambulatory Visit: Payer: Self-pay

## 2021-11-20 ENCOUNTER — Telehealth: Payer: Self-pay

## 2021-11-20 ENCOUNTER — Encounter (HOSPITAL_COMMUNITY): Payer: Self-pay | Admitting: Internal Medicine

## 2021-11-20 ENCOUNTER — Ambulatory Visit (HOSPITAL_COMMUNITY): Payer: Medicare HMO | Admitting: Anesthesiology

## 2021-11-20 ENCOUNTER — Ambulatory Visit (HOSPITAL_BASED_OUTPATIENT_CLINIC_OR_DEPARTMENT_OTHER): Payer: Medicare HMO | Admitting: Anesthesiology

## 2021-11-20 DIAGNOSIS — I110 Hypertensive heart disease with heart failure: Secondary | ICD-10-CM

## 2021-11-20 DIAGNOSIS — K529 Noninfective gastroenteritis and colitis, unspecified: Secondary | ICD-10-CM | POA: Diagnosis not present

## 2021-11-20 DIAGNOSIS — I2510 Atherosclerotic heart disease of native coronary artery without angina pectoris: Secondary | ICD-10-CM | POA: Diagnosis not present

## 2021-11-20 DIAGNOSIS — K5730 Diverticulosis of large intestine without perforation or abscess without bleeding: Secondary | ICD-10-CM | POA: Insufficient documentation

## 2021-11-20 DIAGNOSIS — R159 Full incontinence of feces: Secondary | ICD-10-CM

## 2021-11-20 DIAGNOSIS — D120 Benign neoplasm of cecum: Secondary | ICD-10-CM

## 2021-11-20 DIAGNOSIS — Z950 Presence of cardiac pacemaker: Secondary | ICD-10-CM | POA: Diagnosis not present

## 2021-11-20 DIAGNOSIS — I509 Heart failure, unspecified: Secondary | ICD-10-CM | POA: Diagnosis not present

## 2021-11-20 DIAGNOSIS — D122 Benign neoplasm of ascending colon: Secondary | ICD-10-CM | POA: Insufficient documentation

## 2021-11-20 DIAGNOSIS — G4730 Sleep apnea, unspecified: Secondary | ICD-10-CM | POA: Diagnosis not present

## 2021-11-20 DIAGNOSIS — Z79899 Other long term (current) drug therapy: Secondary | ICD-10-CM | POA: Insufficient documentation

## 2021-11-20 DIAGNOSIS — M1990 Unspecified osteoarthritis, unspecified site: Secondary | ICD-10-CM | POA: Insufficient documentation

## 2021-11-20 DIAGNOSIS — E039 Hypothyroidism, unspecified: Secondary | ICD-10-CM | POA: Insufficient documentation

## 2021-11-20 DIAGNOSIS — Z7984 Long term (current) use of oral hypoglycemic drugs: Secondary | ICD-10-CM | POA: Insufficient documentation

## 2021-11-20 DIAGNOSIS — I4891 Unspecified atrial fibrillation: Secondary | ICD-10-CM | POA: Diagnosis not present

## 2021-11-20 DIAGNOSIS — D128 Benign neoplasm of rectum: Secondary | ICD-10-CM | POA: Insufficient documentation

## 2021-11-20 DIAGNOSIS — Z794 Long term (current) use of insulin: Secondary | ICD-10-CM | POA: Diagnosis not present

## 2021-11-20 DIAGNOSIS — E119 Type 2 diabetes mellitus without complications: Secondary | ICD-10-CM | POA: Diagnosis not present

## 2021-11-20 DIAGNOSIS — R195 Other fecal abnormalities: Secondary | ICD-10-CM

## 2021-11-20 HISTORY — PX: COLONOSCOPY WITH PROPOFOL: SHX5780

## 2021-11-20 HISTORY — PX: BIOPSY: SHX5522

## 2021-11-20 HISTORY — PX: POLYPECTOMY: SHX5525

## 2021-11-20 LAB — GLUCOSE, CAPILLARY: Glucose-Capillary: 102 mg/dL — ABNORMAL HIGH (ref 70–99)

## 2021-11-20 SURGERY — COLONOSCOPY WITH PROPOFOL
Anesthesia: General

## 2021-11-20 MED ORDER — LIDOCAINE HCL (CARDIAC) PF 100 MG/5ML IV SOSY
PREFILLED_SYRINGE | INTRAVENOUS | Status: DC | PRN
  Administered 2021-11-20: 50 mg via INTRAVENOUS

## 2021-11-20 MED ORDER — LACTATED RINGERS IV SOLN: INTRAVENOUS | Status: DC | PRN

## 2021-11-20 MED ORDER — PROPOFOL 10 MG/ML IV BOLUS
INTRAVENOUS | Status: DC | PRN
  Administered 2021-11-20: 50 mg via INTRAVENOUS

## 2021-11-20 MED ORDER — GLUCAGON HCL RDNA (DIAGNOSTIC) 1 MG IJ SOLR
INTRAMUSCULAR | Status: DC | PRN
  Administered 2021-11-20: .5 mg via INTRAVENOUS

## 2021-11-20 MED ORDER — PROPOFOL 500 MG/50ML IV EMUL
INTRAVENOUS | Status: DC | PRN
  Administered 2021-11-20: 100 ug/kg/min via INTRAVENOUS

## 2021-11-20 MED ORDER — PHENYLEPHRINE HCL (PRESSORS) 10 MG/ML IV SOLN
INTRAVENOUS | Status: DC | PRN
  Administered 2021-11-20 (×4): 160 ug via INTRAVENOUS

## 2021-11-20 MED ORDER — GLUCAGON HCL RDNA (DIAGNOSTIC) 1 MG IJ SOLR
INTRAMUSCULAR | Status: AC
  Filled 2021-11-20: qty 1

## 2021-11-20 MED ORDER — PROPOFOL 1000 MG/100ML IV EMUL
INTRAVENOUS | Status: AC
  Filled 2021-11-20: qty 100

## 2021-11-20 NOTE — Anesthesia Preprocedure Evaluation (Signed)
Anesthesia Evaluation  Patient identified by MRN, date of birth, ID band Patient awake    Reviewed: Allergy & Precautions, H&P , NPO status , Patient's Chart, lab work & pertinent test results, reviewed documented beta blocker date and time   History of Anesthesia Complications (+) history of anesthetic complications (severe hypotension, told her not to get GA in future)  Airway Mallampati: III  TM Distance: >3 FB Neck ROM: Full    Dental  (+) Dental Advisory Given, Missing   Pulmonary shortness of breath, sleep apnea and Continuous Positive Airway Pressure Ventilation    Pulmonary exam normal breath sounds clear to auscultation       Cardiovascular Exercise Tolerance: Poor hypertension, Pt. on medications and Pt. on home beta blockers + CAD, + Past MI and +CHF  + dysrhythmias Atrial Fibrillation + pacemaker  Rhythm:Regular Rate:Normal     Neuro/Psych  Neuromuscular disease  negative psych ROS   GI/Hepatic negative GI ROS, Neg liver ROS,,,  Endo/Other  diabetes, Well Controlled, Type 2, Oral Hypoglycemic Agents, Insulin DependentHypothyroidism    Renal/GU Renal disease  Female GU complaint (vulvar cancer)     Musculoskeletal  (+) Arthritis , Osteoarthritis,    Abdominal   Peds negative pediatric ROS (+)  Hematology negative hematology ROS (+)   Anesthesia Other Findings   Reproductive/Obstetrics negative OB ROS                             Anesthesia Physical Anesthesia Plan  ASA: 3  Anesthesia Plan: General   Post-op Pain Management: Minimal or no pain anticipated   Induction:   PONV Risk Score and Plan:   Airway Management Planned:   Additional Equipment:   Intra-op Plan:   Post-operative Plan:   Informed Consent:   Plan Discussed with:   Anesthesia Plan Comments:         Anesthesia Quick Evaluation

## 2021-11-20 NOTE — Transfer of Care (Signed)
Immediate Anesthesia Transfer of Care Note  Patient: [name]  Procedure(s) Performed: COLONOSCOPY WITH PROPOFOL BIOPSY POLYPECTOMY  Patient Location: Short Stay  Anesthesia Type:General  Level of Consciousness: awake, alert , oriented, and patient cooperative  Airway & Oxygen Therapy: Patient Spontanous Breathing  Post-op Assessment: Report given to RN, Post -op Vital signs reviewed and stable, and Patient moving all extremities X 4  Post vital signs: Reviewed and stable  Last Vitals:  Vitals Value Taken Time  BP 94/71 11/20/21 0918  Temp 36.4 C 11/20/21 0918  Pulse 60 11/20/21 0918  Resp 15 11/20/21 0918  SpO2 94 % 11/20/21 0918    Last Pain:  Vitals:   11/20/21 0918  PainSc: 0-No pain         Complications: No notable events documented.

## 2021-11-20 NOTE — H&P (Signed)
_0 @   Primary Care Physician:  Deland Pretty, MD Primary Gastroenterologist:  Dr. Gala Romney  Pre-Procedure History & Physical: HPI:  [name] is a 76 y.o. female here with multiple comorbidities to further evaluate chronic diarrhea.  History of pelvic radiation therapy.  For colonoscopy to further evaluate.  On Eliquis.     Past Medical History:  Diagnosis Date   Arthritis    CAD (coronary artery disease)    Diabetes mellitus without complication (HCC)    Diastolic heart failure ([place])    Dyspnea    Encounter for care of pacemaker 09/03/2018   Family history of kidney cancer    Family history of throat cancer    Family history of uterine cancer    History of chickenpox    History of radiation therapy    Pelvis 10/23/20-12/14/20- Dr. Gery Pray   Hx of psoriatic arthritis    Hyperlipemia    Hypertension    Hypertension 05/03/2018   Hypothyroidism    Mitral valve disorders(424.0)    Myocardial infarction ([place])    Obstructive sleep apnea    OSA (obstructive sleep apnea)    using BiPAP   Pacemaker    Paroxysmal atrial fibrillation ([place]) 10/11/2016   Peripheral neuropathy    Renal disorder Kidney disease stage2   Sick sinus syndrome [facility])    S/p dual-chamber pacemaker   Thyroid disease hypothyroid   Urine incontinence    Vitamin D deficiency    Vulvar cancer ([place]) 2022   s/p surgery and radiation therapy October-December 2022    Past Surgical History:  Procedure Laterality Date   BACK SURGERY     BACK SURGERY     CARDIAC CATHETERIZATION N/A 04/29/2015   Procedure: Temporary Pacemaker;  Surgeon: Adrian Prows, MD;  Location: [place] [facility];  Service: Cardiovascular;  Laterality: N/A;   CARDIOVERSION N/A 09/07/2018   Procedure: CARDIOVERSION;  Surgeon: Adrian Prows, MD;  Location: [place];  Service: Cardiovascular;  Laterality: N/A;   CARDIOVERSION     COLONOSCOPY  08/16/2008   Dr. Gala Romney; pancolonic diverticulosis, otherwise no abnormalities.  Consider  repeat in 10 years.   EP IMPLANTABLE DEVICE N/A 04/30/2015   Procedure: Pacemaker Implant;  Surgeon: Evans Lance, MD;  Location: [place] [facility];  Service: Cardiovascular;  Laterality: N/A;   INTRAVASCULAR PRESSURE WIRE/FFR STUDY N/A 08/31/2017   Procedure: INTRAVASCULAR PRESSURE WIRE/FFR STUDY;  Surgeon: Nigel Mormon, MD;  Location: [place] [facility];  Service: Cardiovascular;  Laterality: N/A;   LEFT AND RIGHT HEART CATHETERIZATION WITH CORONARY ANGIOGRAM N/A 09/30/2011   Procedure: LEFT AND RIGHT HEART CATHETERIZATION WITH CORONARY ANGIOGRAM;  Surgeon: Laverda Page, MD;  Location: [facility] CATH [facility];  Service: Cardiovascular;  Laterality: N/A;   RIGHT/LEFT HEART CATH AND CORONARY ANGIOGRAPHY N/A 08/31/2017   Procedure: RIGHT/LEFT HEART CATH AND CORONARY ANGIOGRAPHY;  Surgeon: Nigel Mormon, MD;  Location: [place] [facility];  Service: Cardiovascular;  Laterality: N/A;   TOOTH EXTRACTION  10/07/2018   TUBAL LIGATION     VULVA Milagros Loll BIOPSY N/A 07/26/2020   Procedure: VULVAR BIOPSY;  Surgeon: Lafonda Mosses, MD;  Location: [facility];  Service: Gynecology;  Laterality: N/A;   YAG LASER APPLICATION Right 24/23/5361   Procedure: YAG LASER APPLICATION;  Surgeon: Elta Guadeloupe T. Gershon Crane, MD;  Location: [facility];  Service: Ophthalmology;  Laterality: Right;  pt knows to arrive at 44:31   YAG LASER APPLICATION Left 54/00/8676   Procedure: YAG LASER APPLICATION;  Surgeon: Rutherford Guys, MD;  Location: [facility];  Service: Ophthalmology;  Laterality: Left;    Prior to Admission medications   Medication Sig Start Date End Date Taking? Authorizing Provider  acetaminophen (TYLENOL) 500 MG tablet Take 500-1,000 mg by mouth every 6 (six) hours as needed (pain.).   Yes [provider]  amiodarone (PACERONE) 200 MG tablet Take 1/2 (one-half) tablet by mouth once daily 05/28/21  Yes Adrian Prows, MD  cefdinir (OMNICEF) 300 MG capsule Take 300 mg by mouth 2 (two) times daily. 11/01/21  Yes  [provider]  cholecalciferol (VITAMIN D3) 25 MCG (1000 UNIT) tablet Take 1,000 Units by mouth in the morning.   Yes [provider]  ELIQUIS 5 MG TABS tablet Take 1 tablet by mouth twice daily 10/03/21  Yes Adrian Prows, MD  FARXIGA 10 MG TABS tablet Take 1 tablet (10 mg total) by mouth daily before breakfast. 11/18/21  Yes Adrian Prows, MD  fenofibrate 160 MG tablet Take 160 mg by mouth daily with supper.    Yes [provider]  GAVILYTE-G 236 g solution Take 4,000 mLs by mouth once. 10/16/21  Yes [provider]  insulin aspart protamine- aspart (NOVOLOG MIX 70/30) (70-30) 100 UNIT/ML injection Inject 30-36 Units into the skin See admin instructions. Inject 36 units subcutaneously with breakfast & inject 30 units subcutaneously with supper (may adjust based on blood sugar readings)   Yes [provider]  isosorbide mononitrate (IMDUR) 60 MG 24 hr tablet Take 1 tablet by mouth once daily Patient taking differently: Take 60 mg by mouth every evening. 09/26/21  Yes Adrian Prows, MD  levothyroxine (SYNTHROID) 125 MCG tablet Take 125 mcg by mouth daily before breakfast. 11/28/19  Yes [provider]  metoprolol tartrate (LOPRESSOR) 50 MG tablet Take 1 tablet by mouth twice daily 09/05/21  Yes Adrian Prows, MD  neomycin-bacitracin-polymyxin [phone] OINT Apply 1 Application topically 3 (three) times daily as needed (wound care).   Yes [provider]  olmesartan-hydrochlorothiazide (BENICAR HCT) 40-25 MG tablet Take 1 tablet by mouth once daily 09/24/21  Yes Adrian Prows, MD  psyllium (METAMUCIL) 0.52 g capsule Take 0.52 g by mouth every evening.   Yes [provider]  spironolactone (ALDACTONE) 25 MG tablet Take 1 tablet by mouth once daily Patient taking differently: Take 25 mg by mouth every evening. 05/28/21  Yes Adrian Prows, MD  torsemide (DEMADEX) 10 MG tablet Take 1 tablet (10 mg total) by mouth as needed. 07/09/21  Yes Cantwell, Celeste  C, PA-C  Blood Glucose Monitoring Suppl (ACCU-CHEK AVIVA PLUS) w/Device KIT     [provider]  clobetasol ointment (TEMOVATE) 2.40 % Apply 1 application  topically 2 (two) times a week. ONLY as needed for itching on your vulva 06/24/21   Lafonda Mosses, MD  diphenhydramine-acetaminophen (TYLENOL PM) 25-500 MG TABS tablet Take 1 tablet by mouth at bedtime as needed (sleep).    [provider]  nitroGLYCERIN (NITROSTAT) 0.4 MG SL tablet Place 1 tablet (0.4 mg total) under the tongue every 5 (five) minutes as needed for chest pain. 10/01/15   Dixie Dials, MD  traMADol (ULTRAM) 50 MG tablet Take 1 tablet (50 mg total) by mouth every 6 (six) hours as needed for severe pain. Do not take and drive Patient taking differently: Take 50 mg by mouth daily as needed for severe pain. Do not take and drive 9/73/53   Joylene John D, NP    Allergies as of 10/16/2021 - Review Complete 10/14/2021  Allergen Reaction Noted  Hydrocodone Other (See Comments) 2015/05/04   Invokana [canagliflozin] Palpitations and Other (See Comments) May 04, 2015   Oxycodone Other (See Comments) 05/04/2015    Family History  Problem Relation Age of Onset   Arthritis Mother    Diabetes Mother    Heart disease Mother    Hyperlipidemia Mother    Hypertension Mother    Arthritis Father    Asthma Father    Heart attack Father    Hyperlipidemia Father    Hypertension Father    Stroke Father    Arthritis Sister    Diabetes Sister    Hypertension Sister    Arthritis Sister    Diabetes Sister    Hyperlipidemia Sister    Hypertension Sister    Stroke Sister    Ovarian cancer Sister 48   Pancreatic cancer Sister 23   Uterine cancer Sister 52   Hyperlipidemia Sister    Hypertension Sister    Arthritis Brother    Heart attack Brother    Heart disease Brother    Hyperlipidemia Brother    Hypertension Brother    Alcohol abuse Brother    Arthritis Brother    Diabetes Brother    Early death Brother     Heart disease Brother    Hyperlipidemia Brother    Hypertension Brother    Cancer Maternal Aunt 70       unknown type   Throat cancer Maternal Grandfather        hx smoking/drinking   Hypertension Daughter    Cancer Daughter        "female cancer cells"   Kidney cancer Nephew        dx 47s   Colon cancer Neg Hx    Breast cancer Neg Hx    Prostate cancer Neg Hx     Social History   Socioeconomic History   Marital status: Widowed    Spouse name: Not on file   Number of children: 3   Years of education: Not on file   Highest education level: Not on file  Occupational History   Not on file  Tobacco Use   Smoking status: Never   Smokeless tobacco: Never  Vaping Use   Vaping Use: Never used  Substance and Sexual Activity   Alcohol use: No    Alcohol/week: 0.0 standard drinks of alcohol   Drug use: No   Sexual activity: Not Currently    Birth control/protection: Post-menopausal  Other Topics Concern   Not on file  Social History Narrative   Husband recently passed away on May 03, 2020.   Social Determinants of Health   Financial Resource Strain: Not on file  Food Insecurity: Not on file  Transportation Needs: Not on file  Physical Activity: Not on file  Stress: Not on file  Social Connections: Not on file  Intimate Partner Violence: Not on file    Review of Systems: See HPI, otherwise negative ROS  Physical Exam: There were no vitals taken for this visit. General:   Alert,  Well-developed, well-nourished, pleasant and cooperative in NAD Mouth:  No deformity or lesions. Neck:  Supple; no masses or thyromegaly. No significant cervical adenopathy. Lungs:  Clear throughout to auscultation.   No wheezes, crackles, or rhonchi. No acute distress. Heart:  Regular rate and rhythm; no murmurs, clicks, rubs,  or gallops. Abdomen: Non-distended, normal bowel sounds.  Soft and nontender without appreciable mass or hepatosplenomegaly.  Pulses:  Normal pulses  noted. Extremities:  Without clubbing or edema.  Impression/Plan: 76 year old  lady with multiple comorbidities chronically anticoagulated here for further evaluation of chronic diarrhea.  History of radiation therapy to the pelvis.  Diarrhea for a year.  Never did submit stool studies for infection.  Recommendations: I have offered the patient a diagnostic colonoscopy. The risks, benefits, limitations, alternatives and imponderables have been reviewed with the patient. Questions have been answered. All parties are agreeable.    Discussed with anesthesia.  Eliquis held 2 days ago.     Notice: This dictation was prepared with Dragon dictation along with smaller phrase technology. Any transcriptional errors that result from this process are unintentional and may not be corrected upon review.

## 2021-11-20 NOTE — Discharge Instructions (Signed)
  Colonoscopy Discharge Instructions  Read the instructions outlined below and refer to this sheet in the next few weeks. These discharge instructions provide you with general information on caring for yourself after you leave the hospital. Your doctor may also give you specific instructions. While your treatment has been planned according to the most current medical practices available, unavoidable complications occasionally occur. If you have any problems or questions after discharge, call Dr. Gala Romney at [phone]. ACTIVITY You may resume your regular activity, but move at a slower pace for the next 24 hours.  Take frequent rest periods for the next 24 hours.  Walking will help get rid of the air and reduce the bloated feeling in your belly (abdomen).  No driving for 24 hours (because of the medicine (anesthesia) used during the test).   Do not sign any important legal documents or operate any machinery for 24 hours (because of the anesthesia used during the test).  NUTRITION Drink plenty of fluids.  You may resume your normal diet as instructed by your doctor.  Begin with a light meal and progress to your normal diet. Heavy or fried foods are harder to digest and may make you feel sick to your stomach (nauseated).  Avoid alcoholic beverages for 24 hours or as instructed.  MEDICATIONS You may resume your normal medications unless your doctor tells you otherwise.  WHAT YOU CAN EXPECT TODAY Some feelings of bloating in the abdomen.  Passage of more gas than usual.  Spotting of blood in your stool or on the toilet paper.  IF YOU HAD POLYPS REMOVED DURING THE COLONOSCOPY: No aspirin products for 7 days or as instructed.  No alcohol for 7 days or as instructed.  Eat a soft diet for the next 24 hours.  FINDING OUT THE RESULTS OF YOUR TEST Not all test results are available during your visit. If your test results are not back during the visit, make an appointment with your caregiver to find out the  results. Do not assume everything is normal if you have not heard from your caregiver or the medical facility. It is important for you to follow up on all of your test results.  SEEK IMMEDIATE MEDICAL ATTENTION IF: You have more than a spotting of blood in your stool.  Your belly is swollen (abdominal distention).  You are nauseated or vomiting.  You have a temperature over 101.  You have abdominal pain or discomfort that is severe or gets worse throughout the day.       multiple polyps removed from your colon today   colon polyp and diverticulosis information provided   samples of your colon taken to evaluate the cause of your diarrhea   resume Eliquis on 11/23/2021   Further recommendations to follow pending review of pathology report  Office visit with [place] in 6 weeks  At patient request I called   Alwyn Ren  [phone] -  rolled to voicemail-left a message

## 2021-11-20 NOTE — Anesthesia Postprocedure Evaluation (Signed)
Anesthesia Post Note  Patient: [name]  Procedure(s) Performed: COLONOSCOPY WITH PROPOFOL BIOPSY POLYPECTOMY  Patient location during evaluation: Phase II Anesthesia Type: General Level of consciousness: awake and alert and oriented Pain management: pain level controlled Vital Signs Assessment: post-procedure vital signs reviewed and stable Respiratory status: spontaneous breathing, nonlabored ventilation and respiratory function stable Cardiovascular status: blood pressure returned to baseline and stable Postop Assessment: no apparent nausea or vomiting Anesthetic complications: no  No notable events documented.   Last Vitals:  Vitals:   11/20/21 0918  BP: 94/71  Pulse: 60  Resp: 15  Temp: (!) 36.4 C  SpO2: 94%    Last Pain:  Vitals:   11/20/21 0918  PainSc: 0-No pain                 Vincenzina Jagoda C Nyia Tsao

## 2021-11-20 NOTE — Op Note (Signed)
[facility] Patient Name: [name] Procedure Date: 11/20/2021 8:01 AM MRN: [id number] Date of Birth: 07/18/1945 Attending MD: Norvel Richards , MD, [id number] CSN: [id number] Age: 76 Admit Type: Outpatient Procedure:                Colonoscopy Indications:              Chronic diarrhea Providers:                Norvel Richards, MD, Janeece Riggers, RN, Everardo Pacific Referring MD:              Medicines:                Propofol per Anesthesia Complications:            No immediate complications. Estimated Blood Loss:     Estimated blood loss was minimal. Estimated blood                            loss was minimal. Procedure:                Pre-Anesthesia Assessment:                           - Prior to the procedure, a History and Physical                            was performed, and patient medications and                            allergies were reviewed. The patient's tolerance of                            previous anesthesia was also reviewed. The risks                            and benefits of the procedure and the sedation                            options and risks were discussed with the patient.                            All questions were answered, and informed consent                            was obtained. Prior Anticoagulants: The patient has                            taken no anticoagulant or antiplatelet agents. ASA                            Grade Assessment: III - A patient with severe                            systemic  disease. After reviewing the risks and                            benefits, the patient was deemed in satisfactory                            condition to undergo the procedure.                           After obtaining informed consent, the colonoscope                            was passed under direct vision. Throughout the                            procedure, the patient's blood pressure, pulse,  and                            oxygen saturations were monitored continuously. The                            [id number]) scope was introduced through the                            anus and advanced to the 5 cm into the ileum.                            Anatomical landmarks were photographed. The                            ileocecal valve, appendiceal orifice, and rectum                            were photographed. The ileocecal valve, appendiceal                            orifice, and rectum were photographed. The                            ileocecal valve, appendiceal orifice, and rectum                            were photographed. Scope In: 8:23:53 AM Scope Out: 9:10:12 AM Scope Withdrawal Time: 0 hours 38 minutes 12 seconds  Total Procedure Duration: 0 hours 46 minutes 19 seconds  Findings:      The perianal and digital rectal examinations were normal.      Many large-mouthed diverticula were found in the entire colon.      Six sessile polyps were found in the cecum. The polyps were 2 to 6 mm in       size. Polyps were hot and cold snared. Margin of polyps behind the       ileocecal valve very difficult to approach. Polyp margin ablated with a       hot snare tip.      Mid rectal polyp?"5 mm was  cold snare removed. The rectal mucosa was       slightly granular in appearance with minimal neovascular changes.       Otherwise, the colonic mucosa appeared normal. Normal distal 5 cm of       terminal ileum      The exam was otherwise without abnormality on direct and retroflexion       views. Impression:               - Diverticulosis in the entire examined colon.                           - Six 2 to 6 mm polyps in the cecum. Hot and cold                            snare removed. Polyp ablation performed.                           - The examination was otherwise normal on direct                            and retroflexion views.                           - No specimens  collected. Moderate Sedation:      Moderate (conscious) sedation was personally administered by an       anesthesia professional. The following parameters were monitored: oxygen       saturation, heart rate, blood pressure, respiratory rate, EKG, adequacy       of pulmonary ventilation, and response to care. Recommendation:           - Patient has a contact number available for                            emergencies. The signs and symptoms of potential                            delayed complications were discussed with the                            patient. Return to normal activities tomorrow.                            Written discharge instructions were provided to the                            patient.                           - Advance diet as tolerated.                           - Continue present medications.                           - Repeat colonoscopy date to be determined after  pending pathology results are reviewed for                            surveillance.                           - Return to GI office in 6 weeks. Resume Eliquis                            11/23/2021 Procedure Code(s):        --- Professional ---                           [phone], Colonoscopy, flexible; diagnostic, including                            collection of specimen(s) by brushing or washing,                            when performed (separate procedure) Diagnosis Code(s):        --- Professional ---                           D12.0, Benign neoplasm of cecum                           K52.9, Noninfective gastroenteritis and colitis,                            unspecified                           K57.30, Diverticulosis of large intestine without                            perforation or abscess without bleeding CPT copyright 2022 American Medical Association. All rights reserved. The codes documented in this report are preliminary and upon coder review may  be revised  to meet current compliance requirements. Cristopher Estimable. Yarielis Funaro, MD Norvel Richards, MD 11/20/2021 9:30:26 AM This report has been signed electronically. Number of Addenda: 0

## 2021-11-20 NOTE — Telephone Encounter (Signed)
Request received to [facility] for device clearance.  Fax returned advising Dr. Einar Gip manages Pt's device.

## 2021-11-21 LAB — SURGICAL PATHOLOGY

## 2021-11-26 ENCOUNTER — Encounter (HOSPITAL_COMMUNITY): Payer: Self-pay | Admitting: Internal Medicine

## 2021-11-27 ENCOUNTER — Encounter: Payer: Self-pay | Admitting: Internal Medicine

## 2021-12-02 DIAGNOSIS — I5033 Acute on chronic diastolic (congestive) heart failure: Secondary | ICD-10-CM | POA: Diagnosis not present

## 2021-12-02 DIAGNOSIS — R069 Unspecified abnormalities of breathing: Secondary | ICD-10-CM | POA: Diagnosis not present

## 2021-12-04 ENCOUNTER — Other Ambulatory Visit: Payer: Self-pay | Admitting: Cardiology

## 2021-12-12 DIAGNOSIS — I129 Hypertensive chronic kidney disease with stage 1 through stage 4 chronic kidney disease, or unspecified chronic kidney disease: Secondary | ICD-10-CM | POA: Diagnosis not present

## 2021-12-12 DIAGNOSIS — N183 Chronic kidney disease, stage 3 unspecified: Secondary | ICD-10-CM | POA: Diagnosis not present

## 2021-12-12 DIAGNOSIS — E1122 Type 2 diabetes mellitus with diabetic chronic kidney disease: Secondary | ICD-10-CM | POA: Diagnosis not present

## 2021-12-23 ENCOUNTER — Other Ambulatory Visit: Payer: Self-pay | Admitting: Cardiology

## 2021-12-23 DIAGNOSIS — I2510 Atherosclerotic heart disease of native coronary artery without angina pectoris: Secondary | ICD-10-CM

## 2021-12-23 DIAGNOSIS — I10 Essential (primary) hypertension: Secondary | ICD-10-CM

## 2021-12-24 ENCOUNTER — Inpatient Hospital Stay: Payer: Medicare HMO | Attending: Gynecologic Oncology | Admitting: Gynecologic Oncology

## 2021-12-24 ENCOUNTER — Encounter: Payer: Self-pay | Admitting: Gynecologic Oncology

## 2021-12-24 ENCOUNTER — Other Ambulatory Visit: Payer: Self-pay

## 2021-12-24 VITALS — BP 147/56 | HR 60 | Temp 98.4°F | Resp 16 | Ht 60.83 in | Wt 189.9 lb

## 2021-12-24 DIAGNOSIS — Z923 Personal history of irradiation: Secondary | ICD-10-CM | POA: Diagnosis not present

## 2021-12-24 DIAGNOSIS — N904 Leukoplakia of vulva: Secondary | ICD-10-CM | POA: Insufficient documentation

## 2021-12-24 DIAGNOSIS — C519 Malignant neoplasm of vulva, unspecified: Secondary | ICD-10-CM

## 2021-12-24 DIAGNOSIS — L858 Other specified epidermal thickening: Secondary | ICD-10-CM

## 2021-12-24 DIAGNOSIS — N9069 Other specified hypertrophy of vulva: Secondary | ICD-10-CM | POA: Diagnosis not present

## 2021-12-24 DIAGNOSIS — Z9079 Acquired absence of other genital organ(s): Secondary | ICD-10-CM | POA: Insufficient documentation

## 2021-12-24 DIAGNOSIS — Z8544 Personal history of malignant neoplasm of other female genital organs: Secondary | ICD-10-CM | POA: Diagnosis not present

## 2021-12-24 MED ORDER — CLOBETASOL PROPIONATE 0.05 % EX OINT: 1.0000 | TOPICAL_OINTMENT | CUTANEOUS | 3 refills | Status: DC

## 2021-12-24 NOTE — Patient Instructions (Addendum)
It was good to see you today.  I will call you when I have your biopsy result back, later this week.  I am sending in a refill for the steroid cream to use if you have itching.  I will see you for follow-up in 3 months.  As always, if you develop any new and concerning symptoms before your next visit, please call to see me sooner.

## 2021-12-24 NOTE — Progress Notes (Signed)
Gynecologic Oncology Return Clinic Visit  12/24/21  Reason for Visit: Follow-up in the setting of vulvar cancer   Treatment History: Oncology History  Vulvar cancer ([place])  07/20/2020 Initial Diagnosis   Vulvar cancer ([place])   07/26/2020 Surgery   EUA, vulvar biopsies  Findings: Pictures taken in media.  On exam, there is about a 4 x 3-1/2 cm area concerning for involvement by carcinoma that spans between the clitoris (replacing the bottom aspect of the clitoris) to just superior to the urethra.  This is a butterfly lesion that extends along the inner labia bilaterally.  Previous biopsy site is healing well with suture still intact.  There is a firmer and raised area that is approximately 1 cm around the area of the biopsy.  Biopsies taken circumferentially around the lesion noted as well as from the central aspect to help delineate what is cancer and what may be chronic dermatoses.  Given appearance today with patient more comfortable, I am suspicious that the entire lesion is carcinoma.   07/26/2020 Pathology Results   A. VULVA, 6 OCLOCK, INFERIOR ASPECT, BIOPSY:  - Superficial fragments of squamous cell carcinoma.   B. VULVA, 11 OCLOCK, BIOPSY:  - Invasive squamous cell carcinoma.  - No lymphovascular invasion.   C. VULVA, 2 OCLOCK, BIOPSY:  - Invasive squamous cell carcinoma.  - No lymphovascular invasion.   D. VULVA, CENTRAL, BIOPSY:  - Superficial fragment of squamous cell carcinoma.    08/06/2020 Imaging   PET: no evidence of metastatic disease   08/07/2020 Clinical Stage   Stage IB   08/22/2020 Surgery   Partial radical anterior vulvectomy with right inguinal SLN biopsy.  Findings: 4x4cm butterfly lesion on the anterior vulva, spanning from just superior to the clitoris to just superior to the urethral meatus. Lesion crossed the midline, more significant lesion on the right. LSG prior to surgery showed bilateral mapping. No hot or blue SLN identified on the left, mildly blue  and hot node identified as the SLN on the right. Decision made to abort left-sided LND given hypertension and difficulty ventilating. Additionally, given intra-op cardiac and respiratory status, decision made to proceed with a less radical resection (very close if not positive peri-urethral margin visibly.   08/22/2020 Pathology Results   Stage IB, lymph node assessment only on the right given comorbidities and cardiac issues during surgery  Tumor Focality:    Unifocal     Tumor Site:    Right vulva     Tumor Site:    Left vulva     Tumor Size:    Greatest Dimension (Centimeters): 4 cm     Histologic Type:    Squamous cell carcinoma, HPV-independent     Histologic Grade:    G3, poorly differentiated     Depth of Tumor Invasion:    6 mm     Tumor Border:    Infiltrating     Other Tissue / Organ Involvement:    Not applicable     Lymphovascular Invasion:    Not identified   MARGINS     Margin Status for Invasive Carcinoma:    All margins negative for invasive carcinoma       Closest Margin(s) to Invasive Carcinoma:    Peripheral: 12:00       Distance from Invasive Carcinoma to Closest Margin:    1 mm     Margin Status for HSIL (VIN2-3) or dVIN:    All margins negative for high-grade squamous intraepithelial lesion (HSIL) and / or  differentiated vulvar intraepithelial neoplasia (dVIN)   REGIONAL LYMPH NODES     Regional Lymph Node Status:           :    All regional lymph nodes negative for tumor cells       Total Number of Lymph Nodes Examined:    1         Nodal Site(s) Examined:    Right inguinal       Number of Sentinel Nodes Examined:    1    09/06/2020 Genetic Testing   Negative genetic testing:  No pathogenic variants detected on the [facility] Multi-Cancer + RNA panel. A variant of uncertain significance (VUS) was detected in the POLD1 gene called c.427G>A (p.Gly143Ser). The report date is 09/06/2020.  The Multi-Cancer + RNA Panel offered by [facility] includes sequencing and/or  deletion/duplication analysis of the following 84 genes:  AIP*, ALK, APC*, ATM*, AXIN2*, BAP1*, BARD1*, BLM*, BMPR1A*, BRCA1*, BRCA2*, BRIP1*, CASR, CDC73*, CDH1*, CDK4, CDKN1B*, CDKN1C*, CDKN2A, CEBPA, CHEK2*, CTNNA1*, DICER1*, DIS3L2*, EGFR, EPCAM, FH*, FLCN*, GATA2*, GPC3, GREM1, HOXB13, HRAS, KIT, MAX*, MEN1*, MET, MITF, MLH1*, MSH2*, MSH3*, MSH6*, MUTYH*, NBN*, NF1*, NF2*, NTHL1*, PALB2*, PDGFRA, PHOX2B, PMS2*, POLD1*, POLE*, POT1*, PRKAR1A*, PTCH1*, PTEN*, RAD50*, RAD51C*, RAD51D*, RB1*, RECQL4, RET, RUNX1*, SDHA*, SDHAF2*, SDHB*, SDHC*, SDHD*, SMAD4*, SMARCA4*, SMARCB1*, SMARCE1*, STK11*, SUFU*, TERC, TERT, TMEM127*, Tp53*, TSC1*, TSC2*, VHL*, WRN*, and WT1.  RNA analysis is performed for * genes.    10/23/2020 - 12/14/2020 Radiation Therapy   10/23/2020 through 12/14/2020 Site Technique Total Dose (Gy) Dose per Fx (Gy) Completed Fx Beam Energies  Vulva: Pelvis IMRT 50.4/50.4 1.8 28/28 6X  Vulva: Pelvis_Bst IMRT 9/9 1.8 5/5 6X        06/21/21: vulvar biopsy Squamous cell hyperplasia, hyperkeratosis, nonspecific inflammation. Changes c/w hypertrophic lichen sclerosus. No in situ or invasive carcinoma.  Interval History: Patient reports overall doing well. She underwent colonoscopy on 11/20/2021.  This revealed many large-mouth diverticula within the entire colon.  6 sessile polyps were found in the cecum.  Rectal polyp was also removed.  Pathology revealed tubular adenoma and multiple adenomatous fragments, no high-grade dysplasia from her cecum polypectomy.  Colonic mucosa with no specific pathologic diagnosis along random colon biopsies within the ascending and descending colon.  Tubular adenomas within ascending and rectal polypectomies.  Colorectal mucosa from rectum biopsy with no specific pathologic diagnosis.  GI recommended increasing Metamucil and using Imodium as needed.  She has not increased her dosing of Metamucil, is not using Imodium.  Describes bowel movements as not diarrhea, often  have a lot of mucus and are frequent.  Has fecal incontinence as well.  Gets some irritation on days where she has many bowel movements.  She denies any vulvar symptoms although has had occasional itching where she initially noticed symptoms.  She thinks this has happened twice since her last visit.  She denies any bleeding or discharge.  She continues to have some urinary frequency.  Past Medical/Surgical History: Past Medical History:  Diagnosis Date   Arthritis    CAD (coronary artery disease)    Diabetes mellitus without complication (HCC)    Diastolic heart failure ([place])    Dyspnea    Encounter for care of pacemaker 09/03/2018   Family history of kidney cancer    Family history of throat cancer    Family history of uterine cancer    History of chickenpox    History of radiation therapy    Pelvis 10/23/20-12/14/20- Dr. Gery Pray   Hx  of psoriatic arthritis    Hyperlipemia    Hypertension    Hypertension 05/03/2018   Hypothyroidism    Mitral valve disorders(424.0)    Myocardial infarction (HCC)    Obstructive sleep apnea    OSA (obstructive sleep apnea)    using BiPAP   Pacemaker    Paroxysmal atrial fibrillation ([place]) 10/11/2016   Peripheral neuropathy    Renal disorder Kidney disease stage2   Sick sinus syndrome [facility])    S/p dual-chamber pacemaker   Thyroid disease hypothyroid   Urine incontinence    Vitamin D deficiency    Vulvar cancer ([place]) 2022   s/p surgery and radiation therapy October-December 2022    Past Surgical History:  Procedure Laterality Date   BACK SURGERY     BACK SURGERY     BIOPSY  11/20/2021   Procedure: BIOPSY;  Surgeon: Daneil Dolin, MD;  [facility];  Service: Endoscopy;;   CARDIAC CATHETERIZATION N/A 04/29/2015   Procedure: Temporary Pacemaker;  Surgeon: Adrian Prows, MD;  Location: [place] [facility];  Service: Cardiovascular;  Laterality: N/A;   CARDIOVERSION N/A 09/07/2018   Procedure: CARDIOVERSION;  Surgeon: Adrian Prows, MD;  Location: [place];  Service: Cardiovascular;  Laterality: N/A;   CARDIOVERSION     COLONOSCOPY  08/16/2008   Dr. Gala Romney; pancolonic diverticulosis, otherwise no abnormalities.  Consider repeat in 10 years.   COLONOSCOPY WITH PROPOFOL N/A 11/20/2021   Procedure: COLONOSCOPY WITH PROPOFOL;  Surgeon: Daneil Dolin, MD;  [facility];  Service: Endoscopy;  Laterality: N/A;  8:15am, asa 3   EP IMPLANTABLE DEVICE N/A 04/30/2015   Procedure: Pacemaker Implant;  Surgeon: Evans Lance, MD;  Location: [place] [facility];  Service: Cardiovascular;  Laterality: N/A;   INTRAVASCULAR PRESSURE WIRE/FFR STUDY N/A 08/31/2017   Procedure: INTRAVASCULAR PRESSURE WIRE/FFR STUDY;  Surgeon: Nigel Mormon, MD;  Location: [place] [facility];  Service: Cardiovascular;  Laterality: N/A;   LEFT AND RIGHT HEART CATHETERIZATION WITH CORONARY ANGIOGRAM N/A 09/30/2011   Procedure: LEFT AND RIGHT HEART CATHETERIZATION WITH CORONARY ANGIOGRAM;  Surgeon: Laverda Page, MD;  Location: [facility] CATH LAB;  Service: Cardiovascular;  Laterality: N/A;   POLYPECTOMY  11/20/2021   Procedure: POLYPECTOMY;  Surgeon: Daneil Dolin, MD;  [facility];  Service: Endoscopy;;   RIGHT/LEFT HEART CATH AND CORONARY ANGIOGRAPHY N/A 08/31/2017   Procedure: RIGHT/LEFT HEART CATH AND CORONARY ANGIOGRAPHY;  Surgeon: Nigel Mormon, MD;  Location: [place] [facility];  Service: Cardiovascular;  Laterality: N/A;   TOOTH EXTRACTION  10/07/2018   TUBAL LIGATION     VULVA Milagros Loll BIOPSY N/A 07/26/2020   Procedure: VULVAR BIOPSY;  Surgeon: Lafonda Mosses, MD;  Location: [facility];  Service: Gynecology;  Laterality: N/A;   YAG LASER APPLICATION Right 44/81/8563   Procedure: YAG LASER APPLICATION;  Surgeon: Elta Guadeloupe T. Gershon Crane, MD;  Location: [facility];  Service: Ophthalmology;  Laterality: Right;  pt knows to arrive at 14:97   YAG LASER APPLICATION Left 02/63/7858   Procedure: YAG LASER APPLICATION;  Surgeon:  Rutherford Guys, MD;  Location: [facility];  Service: Ophthalmology;  Laterality: Left;    Family History  Problem Relation Age of Onset   Arthritis Mother    Diabetes Mother    Heart disease Mother    Hyperlipidemia Mother    Hypertension Mother    Arthritis Father    Asthma Father    Heart attack Father    Hyperlipidemia Father  Hypertension Father    Stroke Father    Arthritis Sister    Diabetes Sister    Hypertension Sister    Arthritis Sister    Diabetes Sister    Hyperlipidemia Sister    Hypertension Sister    Stroke Sister    Ovarian cancer Sister 21   Pancreatic cancer Sister 50   Uterine cancer Sister 66   Hyperlipidemia Sister    Hypertension Sister    Arthritis Brother    Heart attack Brother    Heart disease Brother    Hyperlipidemia Brother    Hypertension Brother    Alcohol abuse Brother    Arthritis Brother    Diabetes Brother    Early death Brother    Heart disease Brother    Hyperlipidemia Brother    Hypertension Brother    Cancer Maternal Aunt 70       unknown type   Throat cancer Maternal Grandfather        hx smoking/drinking   Hypertension Daughter    Cancer Daughter        "female cancer cells"   Kidney cancer Nephew        dx 35s   Colon cancer Neg Hx    Breast cancer Neg Hx    Prostate cancer Neg Hx     Social History   Socioeconomic History   Marital status: Widowed    Spouse name: Not on file   Number of children: 3   Years of education: Not on file   Highest education level: Not on file  Occupational History   Not on file  Tobacco Use   Smoking status: Never   Smokeless tobacco: Never  Vaping Use   Vaping Use: Never used  Substance and Sexual Activity   Alcohol use: No    Alcohol/week: 0.0 standard drinks of alcohol   Drug use: No   Sexual activity: Not Currently    Birth control/protection: Post-menopausal  Other Topics Concern   Not on file  Social History Narrative   Husband recently passed away on 2020/05/26.   Social Determinants of Health   Financial Resource Strain: Not on file  Food Insecurity: Not on file  Transportation Needs: Not on file  Physical Activity: Not on file  Stress: Not on file  Social Connections: Not on file    Current Medications:  Current Outpatient Medications:    acetaminophen (TYLENOL) 500 MG tablet, Take 500-1,000 mg by mouth every 6 (six) hours as needed (pain.)., Disp: , Rfl:    amiodarone (PACERONE) 200 MG tablet, Take 1/2 (one-half) tablet by mouth once daily, Disp: 45 tablet, Rfl: 3   Blood Glucose Monitoring Suppl (ACCU-CHEK AVIVA PLUS) w/Device KIT, , Disp: , Rfl:    cholecalciferol (VITAMIN D3) 25 MCG (1000 UNIT) tablet, Take 1,000 Units by mouth in the morning., Disp: , Rfl:    [START ON 12/25/2021] clobetasol ointment (TEMOVATE) 9.41 %, Apply 1 Application topically 3 (three) times a week. As needed for vulvar itching, Disp: 30 g, Rfl: 3   diphenhydramine-acetaminophen (TYLENOL PM) 25-500 MG TABS tablet, Take 1 tablet by mouth at bedtime as needed (sleep)., Disp: , Rfl:    ELIQUIS 5 MG TABS tablet, Take 1 tablet by mouth twice daily, Disp: 180 tablet, Rfl: 0   FARXIGA 10 MG TABS tablet, Take 1 tablet (10 mg total) by mouth daily before breakfast., Disp: 90 tablet, Rfl: 0   fenofibrate 160 MG tablet, Take 160 mg by mouth daily with supper. ,  Disp: , Rfl:    insulin aspart protamine- aspart (NOVOLOG MIX 70/30) (70-30) 100 UNIT/ML injection, Inject 30-36 Units into the skin See admin instructions. Inject 36 units subcutaneously with breakfast & inject 30 units subcutaneously with supper (may adjust based on blood sugar readings), Disp: , Rfl:    levothyroxine (SYNTHROID) 137 MCG tablet, Take 137 mcg by mouth daily before breakfast., Disp: , Rfl:    metoprolol tartrate (LOPRESSOR) 50 MG tablet, Take 1 tablet by mouth twice daily, Disp: 180 tablet, Rfl: 0   nitroGLYCERIN (NITROSTAT) 0.4 MG SL tablet, Place 1 tablet (0.4 mg total) under the tongue every 5  (five) minutes as needed for chest pain., Disp: 25 tablet, Rfl: 1   olmesartan-hydrochlorothiazide (BENICAR HCT) 40-25 MG tablet, Take 1 tablet by mouth once daily, Disp: 90 tablet, Rfl: 0   psyllium (METAMUCIL) 0.52 g capsule, Take 0.52 g by mouth every evening., Disp: , Rfl:    spironolactone (ALDACTONE) 25 MG tablet, Take 1 tablet by mouth once daily (Patient taking differently: Take 25 mg by mouth every evening.), Disp: 90 tablet, Rfl: 3   torsemide (DEMADEX) 10 MG tablet, Take 1 tablet (10 mg total) by mouth as needed., Disp: 30 tablet, Rfl: 3   isosorbide mononitrate (IMDUR) 60 MG 24 hr tablet, Take 1 tablet by mouth once daily, Disp: 90 tablet, Rfl: 0  Review of Systems: + Fatigue, shortness of breath with walking (at baseline), diarrhea, back pain Denies appetite changes, fevers, chills, unexplained weight changes. Denies hearing loss, neck lumps or masses, mouth sores, ringing in ears or voice changes. Denies cough or wheezing.  Denies chest pain or palpitations. Denies leg swelling. Denies abdominal distention, pain, blood in stools, constipation, nausea, vomiting, or early satiety. Denies pain with intercourse, dysuria, frequency, hematuria or incontinence. Denies hot flashes, pelvic pain, vaginal bleeding or vaginal discharge.   Denies joint pain or muscle pain/cramps. Denies itching, rash, or wounds. Denies dizziness, headaches, numbness or seizures. Denies swollen lymph nodes or glands, denies easy bruising or bleeding. Denies anxiety, depression, confusion, or decreased concentration.  Physical Exam: BP (!) 147/56 (BP Location: Right Arm, Patient Position: Sitting)   Pulse 60   Temp 98.4 F (36.9 C) (Oral)   Resp 16   Ht 5' 0.83" (1.545 m)   Wt 189 lb 14.4 oz (86.1 kg)   SpO2 96%   BMI 36.09 kg/m  General: Alert, oriented, no acute distress. HEENT: Posterior oropharynx clear, sclera anicteric. Chest: Clear to auscultation bilaterally.  No wheezes or  rhonchi. Cardiovascular: Regular rate and rhythm, no murmurs. Abdomen: Obese, soft, nontender.  Normoactive bowel sounds.  No masses or hepatosplenomegaly appreciated.  Extremities: Grossly normal range of motion.  Warm, well perfused.  Trace edema bilaterally. Skin: No rashes or lesions noted. Lymphatics: No cervical, supraclavicular, or inguinal adenopathy. GU: External female genitalia atrophic with radiation changes present especially along the perineum and posterior fourchette.  No inguinal adenopathy appreciated.  There is an area of leukoplakia anteriorly as well as some leukoplakia along the perineum.  After application of acetic acid, no acetowhite changes, no change to leukoplakia.  Leukoplakia along the anterior aspect of the vulva is new, recommended biopsy today.  On bimanual exam, no nodularity or masses palpated.  Vulvar biopsy procedure Preoperative diagnosis: Leukoplakia, history of vulvar cancer Postoperative diagnosis: Same as above Physician: Berline Lopes MD Estimated blood loss: Minimal Specimens: vulvar biopsy at 12:00 Procedure: After the procedure was discussed with the patient including risks and benefits, she gave verbal consent.  She  was then placed in dorsolithotomy position.  Once the cervix was well visualized it was cleansed with Betadine x3.  Tischler forceps were then used to take it biopsy of the anterior vulva.  This was placed in formalin.  For nitrate was then used to achieve hemostasis of the biopsy site.  Overall the patient tolerated the procedure well.    Laboratory & Radiologic Studies: None new  Assessment & Plan: YEVONNE YOKUM is a 76 y.o. woman with at least Stage IB grade 3 SCC of the vulva, HPV - independent, who presents for surveillance after completing adjuvant radiation in 12/2020.   Patient is overall doing well.  Given some new leukoplakia on exam today, biopsy taken anteriorly from the vulva.  My suspicion that this represents recurrent  dysplasia is low.  She denies any vulvar symptoms.  Has been using clobetasol as needed at home.  Thinks that she is out of her prescription and asked for a new prescription to be sent today.  I will call her with biopsy results once back.  Discussed findings from her colonoscopy and reviewed recommendations from gastroenterology to increase her Metamucil dose and use Imodium as needed.   Per NCCN surveillance recommendations, we will continue with visits every 3 months.  She continues to prefer to have this done solely in our clinic.  We reviewed signs and symptoms that would be concerning for disease recurrence, and I have stressed the importance of her calling if she develops any of these between visits.  28 minutes of total time was spent for this patient encounter, including preparation, face-to-face counseling with the patient and coordination of care, and documentation of the encounter.  Jeral Pinch, MD  Division of Gynecologic Oncology  Department of Obstetrics and Gynecology  [facility]

## 2021-12-31 ENCOUNTER — Telehealth: Payer: Self-pay | Admitting: Gynecologic Oncology

## 2021-12-31 LAB — SURGICAL PATHOLOGY

## 2021-12-31 NOTE — Telephone Encounter (Signed)
Called the patient to discuss recent biopsy results.  No answer, callback requested.  If the patient calls, please let her know that biopsy showed lichen sclerosis with some low-grade dysplasia changes.  These are both things that we will just keep an eye on.  The lichen sclerosus is what can cause some itching and we use the clobetasol cream (I sent a new prescription in at her last visit) to treat.  Please encourage her to call if she develops any worsening or new symptoms before her next visit.  Jeral Pinch MD Gynecologic Oncology

## 2022-01-01 ENCOUNTER — Telehealth: Payer: Self-pay

## 2022-01-01 DIAGNOSIS — R069 Unspecified abnormalities of breathing: Secondary | ICD-10-CM | POA: Diagnosis not present

## 2022-01-01 DIAGNOSIS — I5033 Acute on chronic diastolic (congestive) heart failure: Secondary | ICD-10-CM | POA: Diagnosis not present

## 2022-01-01 NOTE — Telephone Encounter (Signed)
Pt states she is returning Dr. Lulu Riding call.  Per Dr. Berline Lopes I relayed message regarding biopsy results. Pt states an understanding and will call with any concerns before next visit in March.

## 2022-01-05 ENCOUNTER — Other Ambulatory Visit: Payer: Self-pay | Admitting: Cardiology

## 2022-01-08 ENCOUNTER — Telehealth: Payer: Self-pay

## 2022-01-08 NOTE — Telephone Encounter (Signed)
Patient called asking if we had called in her Eliquis. I told her that it was sent in today.

## 2022-01-15 ENCOUNTER — Ambulatory Visit (INDEPENDENT_AMBULATORY_CARE_PROVIDER_SITE_OTHER): Payer: Medicare HMO | Admitting: Gastroenterology

## 2022-01-15 ENCOUNTER — Encounter: Payer: Self-pay | Admitting: Gastroenterology

## 2022-01-15 VITALS — BP 138/71 | HR 60 | Temp 98.2°F | Ht 62.0 in | Wt 192.6 lb

## 2022-01-15 DIAGNOSIS — R159 Full incontinence of feces: Secondary | ICD-10-CM | POA: Diagnosis not present

## 2022-01-15 DIAGNOSIS — R195 Other fecal abnormalities: Secondary | ICD-10-CM

## 2022-01-15 DIAGNOSIS — R152 Fecal urgency: Secondary | ICD-10-CM

## 2022-01-15 NOTE — Progress Notes (Signed)
GI Office Note    Referring Provider: Deland Pretty, MD Primary Care Physician:  Deland Pretty, MD  Primary Gastroenterologist: Garfield Cornea, MD   Chief Complaint   Chief Complaint  Patient presents with   Follow-up    Still has issues with bowels leaking especially in the morning.     History of Present Illness   [name] is a 77 y.o. female presenting today for follow up. Last seen 10/2021 in the office for loose stools, fecal incontinence with urgency. Patient has history of vulvar cancer 2022 s/p radiation therapy October 2022-December 2022.    Completed colonoscopy after last visit. Random biopsies negative. She had three tubular adenomas removed. She did not complete stool studies. She increased her metamucil to two capsules per day. States she took imodium some but has since came off. She believes it may have helped but she was worried about getting constipated. She describes bristol 7 stools. Patient complains of constant leakage, urgency/tenesmus since radiation. Whenever she stands up she may have stool seepage. Wears a pad all the time. Has to change clothes several times daily. Having stool every time she urinates. At least 5-6 times per day. Worse in am. At night typically passes mostly mucous. No melena, brbpr. No abdominal pain.   Colonoscopy 11/2021: - Diverticulosis in the entire examined colon. - Six 2 to 6 mm polyps in the cecum. Hot and cold snare removed. Polyp ablation performed. - The examination was otherwise normal on direct and retroflexion views. - No specimens collected. -Rectal and random colon biopsies negative.   -3 tubular adenomas removed. -Consider colonoscopy in 3 years if overall health permits  Medications   Current Outpatient Medications  Medication Sig Dispense Refill   acetaminophen (TYLENOL) 500 MG tablet Take 500-1,000 mg by mouth every 6 (six) hours as needed (pain.).     amiodarone (PACERONE) 200 MG tablet Take 1/2  (one-half) tablet by mouth once daily 45 tablet 3   Blood Glucose Monitoring Suppl (ACCU-CHEK AVIVA PLUS) w/Device KIT      cholecalciferol (VITAMIN D3) 25 MCG (1000 UNIT) tablet Take 1,000 Units by mouth in the morning.     diphenhydramine-acetaminophen (TYLENOL PM) 25-500 MG TABS tablet Take 1 tablet by mouth at bedtime as needed (sleep).     ELIQUIS 5 MG TABS tablet Take 1 tablet by mouth twice daily 180 tablet 0   FARXIGA 10 MG TABS tablet Take 1 tablet (10 mg total) by mouth daily before breakfast. 90 tablet 0   fenofibrate 160 MG tablet Take 160 mg by mouth daily with supper.      insulin aspart protamine- aspart (NOVOLOG MIX 70/30) (70-30) 100 UNIT/ML injection Inject 30-36 Units into the skin See admin instructions. Inject 36 units subcutaneously with breakfast & inject 30 units subcutaneously with supper (may adjust based on blood sugar readings)     isosorbide mononitrate (IMDUR) 60 MG 24 hr tablet Take 1 tablet by mouth once daily 90 tablet 0   levothyroxine (SYNTHROID) 137 MCG tablet Take 137 mcg by mouth daily before breakfast.     metoprolol tartrate (LOPRESSOR) 50 MG tablet Take 1 tablet by mouth twice daily 180 tablet 0   nitroGLYCERIN (NITROSTAT) 0.4 MG SL tablet Place 1 tablet (0.4 mg total) under the tongue every 5 (five) minutes as needed for chest pain. 25 tablet 1   olmesartan-hydrochlorothiazide (BENICAR HCT) 40-25 MG tablet Take 1 tablet by mouth once daily 90 tablet 0   psyllium (METAMUCIL) 0.52 g  capsule Take 0.52 g by mouth every evening.     spironolactone (ALDACTONE) 25 MG tablet Take 1 tablet by mouth once daily (Patient taking differently: Take 25 mg by mouth every evening.) 90 tablet 3   torsemide (DEMADEX) 10 MG tablet Take 1 tablet (10 mg total) by mouth as needed. 30 tablet 3   No current facility-administered medications for this visit.    Allergies   Allergies as of 01/15/2022 - Review Complete 01/15/2022  Allergen Reaction Noted   Hydrocodone Other (See  Comments) 04/29/2015   Invokana [canagliflozin] Palpitations and Other (See Comments) 04/29/2015   Oxycodone Other (See Comments) 04/29/2015      Review of Systems   General: Negative for anorexia, weight loss, fever, chills, fatigue, weakness. ENT: Negative for hoarseness, difficulty swallowing , nasal congestion. CV: Negative for chest pain, angina, palpitations, dyspnea on exertion, peripheral edema.  Respiratory: Negative for dyspnea at rest, dyspnea on exertion, cough, sputum, wheezing.  GI: See history of present illness. GU:  Negative for dysuria, hematuria, urinary incontinence, urinary frequency, nocturnal urination.  Endo: Negative for unusual weight change.     Physical Exam   BP 138/71 (BP Location: Right Arm, Patient Position: Sitting, Cuff Size: Large)   Pulse 60   Temp 98.2 F (36.8 C) (Oral)   Ht _0  (1.575 m)   Wt 192 lb 9.6 oz (87.4 kg)   SpO2 94%   BMI 35.23 kg/m    General: Well-nourished, well-developed in no acute distress.  Eyes: No icterus. Mouth: Oropharyngeal mucosa moist and pink  Abdomen: Bowel sounds are normal, nontender, nondistended, no hepatosplenomegaly or masses,  no abdominal bruits or hernia , no rebound or guarding.  Extremities: No lower extremity edema. No clubbing or deformities. Neuro: Alert and oriented x 4   Skin: Warm and dry, no jaundice.   Psych: Alert and cooperative, normal mood and affect.  Labs   Lab Results  Component Value Date   CREATININE 1.82 (H) 11/18/2021   BUN 32 (H) 11/18/2021   NA 137 11/18/2021   K 4.8 11/18/2021   CL 102 11/18/2021   CO2 26 11/18/2021     Imaging Studies   No results found.  Assessment   Fecal incontinence/loose stool: noted since pelvic radiation in 2022. Stool bristol 7, passes stool with urination, with standing. Often incontinent. At this time only taking 1 gram of metamucil daily. Colonoscopy as outlined, with unremarkable random colon/rectal biopsies no obvious changes of  radiation proctitis/colitis. I doubt infectious etiology, but to be thorough we will check. Once stool collected, start imodium 2 mg BID. Goal to make subjectively constipated to help with incontinence issues.    PLAN   Continue metamucil 2 capsules daily. Gi profile. Add imodium 2 mg bid, will need to titrate dose for effectiveness.  She will call with progress report in 2-3 weeks.    Laureen Ochs. Bobby Rumpf, [place], [facility]

## 2022-01-15 NOTE — Patient Instructions (Signed)
Please collect stool to rule out infection. You will need to go to [facility] to pick up specimen container. Orders provided today.  Start imodium '2mg'$  twice a day to firm up your stool. If you get constipated, you can decrease to once per day.  Call in 2-3 weeks and let me know how your stools are doing and if you are able to control any better. Call and speak to my CMA, Tammy at [phone].

## 2022-01-16 DIAGNOSIS — R195 Other fecal abnormalities: Secondary | ICD-10-CM | POA: Diagnosis not present

## 2022-01-18 LAB — GI PROFILE, STOOL, PCR

## 2022-01-30 ENCOUNTER — Encounter: Payer: Self-pay | Admitting: Cardiology

## 2022-01-30 ENCOUNTER — Ambulatory Visit: Payer: Medicare HMO | Admitting: Cardiology

## 2022-01-30 VITALS — BP 145/53 | HR 61 | Resp 16 | Ht 62.0 in | Wt 192.6 lb

## 2022-01-30 DIAGNOSIS — I480 Paroxysmal atrial fibrillation: Secondary | ICD-10-CM

## 2022-01-30 DIAGNOSIS — I6523 Occlusion and stenosis of bilateral carotid arteries: Secondary | ICD-10-CM | POA: Diagnosis not present

## 2022-01-30 DIAGNOSIS — K627 Radiation proctitis: Secondary | ICD-10-CM | POA: Diagnosis not present

## 2022-01-30 DIAGNOSIS — I5032 Chronic diastolic (congestive) heart failure: Secondary | ICD-10-CM

## 2022-01-30 DIAGNOSIS — I10 Essential (primary) hypertension: Secondary | ICD-10-CM | POA: Diagnosis not present

## 2022-01-30 DIAGNOSIS — Z950 Presence of cardiac pacemaker: Secondary | ICD-10-CM

## 2022-01-30 DIAGNOSIS — E039 Hypothyroidism, unspecified: Secondary | ICD-10-CM | POA: Diagnosis not present

## 2022-01-30 MED ORDER — COLESEVELAM HCL 625 MG PO TABS: 1250.0000 mg | ORAL_TABLET | Freq: Two times a day (BID) | ORAL | 2 refills | Status: DC

## 2022-01-30 NOTE — Progress Notes (Signed)
Primary Physician/Referring:  Deland Pretty, MD  Patient ID: [name], female    DOB: 10-26-1945, 77 y.o.   MRN: [id number]  Chief Complaint  Patient presents with   Coronary Artery Disease   Pacemaker Check   Congestive Heart Failure   Sick sinus   Follow-up    6 months    HPI:    KYM SCANNELL  is a 77 y.o. Caucasian female patient with primary hypertension, diabetes mellitus with stage IIIa chronic kidney disease, chronic diastolic heart failure, hypercholesterolemia, nonobstructive CAD (Cath 2019), subsequent atrial fibrillation and sick sinus syndrome status post dual-chamber pacemaker 2017, OSA on BiPAP.  Her last cardioversion was 09/07/2018 for atrial flutter.  Patient had a very complex postop course in 2022 after she underwent surgery for squamous cell carcinoma of the lower leading to hypoxemic respiratory failure and pulmonary edema and site infection.  Fortunately she has recuperated well and she is back to her baseline.  Since then she has developed radiation-induced proctitis and fecal incontinence.  Dyspnea has remained stable.  She has not had any further hospitalization or ED visit due to dyspnea, leg edema or PND or orthopnea.  Has not had any chest pain.  States that her blood pressure is well-controlled.  Past Medical History:  Diagnosis Date   Arthritis    CAD (coronary artery disease)    Diabetes mellitus without complication (HCC)    Diastolic heart failure ([place])    Dyspnea    Encounter for care of pacemaker 09/03/2018   Family history of kidney cancer    Family history of throat cancer    Family history of uterine cancer    History of chickenpox    History of radiation therapy    Pelvis 10/23/20-12/14/20- Dr. Gery Pray   Hx of psoriatic arthritis    Hyperlipemia    Hypertension    Hypertension 05/03/2018   Hypothyroidism    Mitral valve disorders(424.0)    Myocardial infarction (HCC)    Obstructive sleep apnea    OSA (obstructive sleep  apnea)    using BiPAP   Pacemaker    Paroxysmal atrial fibrillation ([place]) 10/11/2016   Peripheral neuropathy    Renal disorder Kidney disease stage2   Sick sinus syndrome [facility])    S/p dual-chamber pacemaker   Thyroid disease hypothyroid   Urine incontinence    Vitamin D deficiency    Vulvar cancer ([place]) 2022   s/p surgery and radiation therapy October-December 2022   Past Surgical History:  Procedure Laterality Date   BACK SURGERY     BACK SURGERY     BIOPSY  11/20/2021   Procedure: BIOPSY;  Surgeon: Daneil Dolin, MD;  [facility];  Service: Endoscopy;;   CARDIAC CATHETERIZATION N/A 04/29/2015   Procedure: Temporary Pacemaker;  Surgeon: Adrian Prows, MD;  Location: [place] CV LAB;  Service: Cardiovascular;  Laterality: N/A;   CARDIOVERSION N/A 09/07/2018   Procedure: CARDIOVERSION;  Surgeon: Adrian Prows, MD;  Location: [place];  Service: Cardiovascular;  Laterality: N/A;   CARDIOVERSION     COLONOSCOPY  08/16/2008   Dr. Gala Romney; pancolonic diverticulosis, otherwise no abnormalities.  Consider repeat in 10 years.   COLONOSCOPY WITH PROPOFOL N/A 11/20/2021   Procedure: COLONOSCOPY WITH PROPOFOL;  Surgeon: Daneil Dolin, MD;  [facility];  Service: Endoscopy;  Laterality: N/A;  8:15am, asa 3   EP IMPLANTABLE DEVICE N/A 04/30/2015   Procedure: Pacemaker Implant;  Surgeon: Evans Lance, MD;  Location:  [place] INVASIVE CV LAB;  Service: Cardiovascular;  Laterality: N/A;   INTRAVASCULAR PRESSURE WIRE/FFR STUDY N/A 08/31/2017   Procedure: INTRAVASCULAR PRESSURE WIRE/FFR STUDY;  Surgeon: Nigel Mormon, MD;  Location: [place] CV LAB;  Service: Cardiovascular;  Laterality: N/A;   LEFT AND RIGHT HEART CATHETERIZATION WITH CORONARY ANGIOGRAM N/A 09/30/2011   Procedure: LEFT AND RIGHT HEART CATHETERIZATION WITH CORONARY ANGIOGRAM;  Surgeon: Laverda Page, MD;  Location: [facility] CATH LAB;  Service: Cardiovascular;  Laterality: N/A;   POLYPECTOMY  11/20/2021    Procedure: POLYPECTOMY;  Surgeon: Daneil Dolin, MD;  [facility];  Service: Endoscopy;;   RIGHT/LEFT HEART CATH AND CORONARY ANGIOGRAPHY N/A 08/31/2017   Procedure: RIGHT/LEFT HEART CATH AND CORONARY ANGIOGRAPHY;  Surgeon: Nigel Mormon, MD;  Location: [place] CV LAB;  Service: Cardiovascular;  Laterality: N/A;   TOOTH EXTRACTION  10/07/2018   TUBAL LIGATION     VULVA Milagros Loll BIOPSY N/A 07/26/2020   Procedure: VULVAR BIOPSY;  Surgeon: Lafonda Mosses, MD;  Location: [facility];  Service: Gynecology;  Laterality: N/A;   YAG LASER APPLICATION Right 16/10/9602   Procedure: YAG LASER APPLICATION;  Surgeon: Elta Guadeloupe T. Gershon Crane, MD;  Location: [facility];  Service: Ophthalmology;  Laterality: Right;  pt knows to arrive at 54:09   YAG LASER APPLICATION Left 81/19/1478   Procedure: YAG LASER APPLICATION;  Surgeon: Rutherford Guys, MD;  Location: [facility];  Service: Ophthalmology;  Laterality: Left;     Social History   Tobacco Use   Smoking status: Never   Smokeless tobacco: Never  Substance Use Topics   Alcohol use: No    Alcohol/week: 0.0 standard drinks of alcohol   Marital Status: Widowed  ROS  Review of Systems  Cardiovascular:  Negative for chest pain, claudication, dyspnea on exertion, leg swelling, near-syncope, orthopnea, palpitations, paroxysmal nocturnal dyspnea and syncope.  Hematologic/Lymphatic: Does not bruise/bleed easily.  Neurological:  Negative for dizziness.   Objective  Blood pressure (!) 145/53, pulse 61, resp. rate 16, height '5\' 2"'$  (1.575 m), weight 192 lb 9.6 oz (87.4 kg), SpO2 91 %.     01/30/2022    2:01 PM 01/30/2022    1:51 PM 01/15/2022    2:01 PM  Vitals with BMI  Height  '5\' 2"'$  '5\' 2"'$   Weight  192 lbs 10 oz 192 lbs 10 oz  BMI  29.56 21.30  Systolic 865 784 696  Diastolic 53 50 71  Pulse 61 60 60     Physical Exam Vitals reviewed.  Constitutional:      Appearance: She is well-developed. She is obese.  Neck:     Vascular: No JVD.   Cardiovascular:     Rate and Rhythm: Normal rate and regular rhythm.     Pulses: Intact distal pulses.          Carotid pulses are 2+ on the right side and 2+ on the left side with bruit.      Popliteal pulses are 1+ on the right side and 1+ on the left side.       Dorsalis pedis pulses are 1+ on the right side and 1+ on the left side.       Posterior tibial pulses are 1+ on the right side and 1+ on the left side.     Heart sounds: S1 normal and S2 normal. Murmur heard.     Midsystolic murmur is present with a grade of 2/6 at the upper right sternal border and apex.  No gallop.  Pulmonary:     Effort: Pulmonary effort is normal. No accessory muscle usage or respiratory distress.     Breath sounds: Normal breath sounds. No wheezing, rhonchi or rales.  Musculoskeletal:     Right lower leg: No edema.     Left lower leg: No edema.     Laboratory examination:   Recent Labs    08/01/21 0851 08/15/21 0953 11/18/21 1042  NA 138 137 137  K 5.8* 4.8 4.8  CL 99 99 102  CO2 '27 24 26  '$ GLUCOSE 142* 168* 226*  BUN 20 27 32*  CREATININE 1.53* 1.52* 1.82*  CALCIUM 10.8* 10.5* 10.0  GFRNONAA  --   --  28*   CrCl cannot be calculated (Patient's most recent lab result is older than the maximum 21 days allowed.).     Latest Ref Rng & Units 11/18/2021   10:42 AM 08/15/2021    9:53 AM 08/01/2021    8:51 AM  CMP  Glucose 70 - 99 mg/dL 226  168  142   BUN 8 - 23 mg/dL 32  27  20   Creatinine 0.44 - 1.00 mg/dL 1.82  1.52  1.53   Sodium 135 - 145 mmol/L 137  137  138   Potassium 3.5 - 5.1 mmol/L 4.8  4.8  5.8   Chloride 98 - 111 mmol/L 102  99  99   CO2 22 - 32 mmol/L '26  24  27   '$ Calcium 8.9 - 10.3 mg/dL 10.0  10.5  10.8       Latest Ref Rng & Units 09/03/2020   12:24 PM 07/26/2020   11:15 AM 08/26/2019   10:55 AM  CBC  WBC 4.0 - 10.5 K/uL 8.1  7.9  7.1   Hemoglobin 12.0 - 15.0 g/dL 9.9  13.4  13.6   Hematocrit 36.0 - 46.0 % 31.0  41.7  43.0   Platelets 150 - 400 K/uL 223  148  155     HEMOGLOBIN A1C Lab Results  Component Value Date   HGBA1C 7.8 (H) 07/26/2020   MPG 177.16 07/26/2020   Lab Results  Component Value Date   TSH 4.404 08/26/2019    External labs:  06/20/2021: Total cholesterol 179, HDL 24, LDL 96, triglycerides 296 TSH 14.2 Hgb 13.5, HCT 41.4, MCV 95.8, platelet 163 ALT 32, AST 22, BUN 23, creatinine 1.46, potassium 5.7, sodium 142, GFR 34  Allergies   Allergies  Allergen Reactions   Hydrocodone Other (See Comments)    Lethargic    Invokana [Canagliflozin] Palpitations and Other (See Comments)    Made heart race   Oxycodone Other (See Comments)    Lethargic     Final Medications at End of Visit     Current Outpatient Medications:    acetaminophen (TYLENOL) 500 MG tablet, Take 500-1,000 mg by mouth every 6 (six) hours as needed (pain.)., Disp: , Rfl:    amiodarone (PACERONE) 200 MG tablet, Take 1/2 (one-half) tablet by mouth once daily (Patient taking differently: Take 100 mg by mouth daily.), Disp: 45 tablet, Rfl: 3   Blood Glucose Monitoring Suppl (ACCU-CHEK AVIVA PLUS) w/Device KIT, , Disp: , Rfl:    cholecalciferol (VITAMIN D3) 25 MCG (1000 UNIT) tablet, Take 1,000 Units by mouth in the morning., Disp: , Rfl:    colesevelam (WELCHOL) 625 MG tablet, Take 2 tablets (1,250 mg total) by mouth 2 (two) times daily with a meal., Disp: 60 tablet, Rfl: 2   diphenhydramine-acetaminophen (TYLENOL PM) 25-500  MG TABS tablet, Take 1 tablet by mouth at bedtime as needed (sleep)., Disp: , Rfl:    ELIQUIS 5 MG TABS tablet, Take 1 tablet by mouth twice daily, Disp: 180 tablet, Rfl: 0   FARXIGA 10 MG TABS tablet, Take 1 tablet (10 mg total) by mouth daily before breakfast., Disp: 90 tablet, Rfl: 0   fenofibrate 160 MG tablet, Take 160 mg by mouth daily with supper. , Disp: , Rfl:    insulin aspart protamine- aspart (NOVOLOG MIX 70/30) (70-30) 100 UNIT/ML injection, Inject 30-36 Units into the skin See admin instructions. Inject 36 units subcutaneously  with breakfast & inject 30 units subcutaneously with supper (may adjust based on blood sugar readings), Disp: , Rfl:    isosorbide mononitrate (IMDUR) 60 MG 24 hr tablet, Take 1 tablet by mouth once daily, Disp: 90 tablet, Rfl: 0   levothyroxine (SYNTHROID) 137 MCG tablet, Take 137 mcg by mouth daily before breakfast., Disp: , Rfl:    metoprolol tartrate (LOPRESSOR) 50 MG tablet, Take 1 tablet by mouth twice daily, Disp: 180 tablet, Rfl: 0   nitroGLYCERIN (NITROSTAT) 0.4 MG SL tablet, Place 1 tablet (0.4 mg total) under the tongue every 5 (five) minutes as needed for chest pain., Disp: 25 tablet, Rfl: 1   olmesartan-hydrochlorothiazide (BENICAR HCT) 40-25 MG tablet, Take 1 tablet by mouth once daily, Disp: 90 tablet, Rfl: 0   psyllium (REGULOID) 0.52 g capsule, Take 0.52 g by mouth every evening., Disp: , Rfl:    spironolactone (ALDACTONE) 25 MG tablet, Take 1 tablet by mouth once daily (Patient taking differently: Take 25 mg by mouth every evening.), Disp: 90 tablet, Rfl: 3   torsemide (DEMADEX) 10 MG tablet, Take 1 tablet (10 mg total) by mouth as needed., Disp: 30 tablet, Rfl: 3   Radiology:   No results found.  Cardiac Studies:    Lexiscan myoview stress test 06/15/2017: 1. Lexiscan stress test was performed. Exercise capacity was not assessed. Stress symptoms included dizziness. Peak blood pressure 158/66 mmHg. Stress EKG is non diagnostic for ischemia as it is a pharmacologic stress. In addition, it demonstrated atrial pacing and incomplete LBBB. 2. The overall quality of the study is excellent. There is no evidence of abnormal lung activity. Stress and rest SPECT images demonstrate homogeneous tracer distribution throughout the myocardium. Gated SPECT imaging reveals normal myocardial thickening and wall motion. The left ventricular ejection fraction was normal (71%). 3. Low risk study.   Sleep Study 08/11/2017: Positive for Complex Sleep Apnea; On CPAP    R&LHC 08/31/2017: LCx:  Nondominant. AV grove LCx 50-60% diffuse disease. RCA: Large dominant. Ostial PDA 50% stenosis, FFR 0.96. No change from 09/30/2011. RA: 5 mmHg RV: 45/4 mmHg. PA: 40/12 mmHg. Mean PA 29 mmHg PCWP: 16 mmHg LV: 130/3 mmHg, LVEDP 13 mmHg CO: 3.6 L/min. CI 1.9 L/min/m2    Direct current cardioversion 09/07/2018: Indication symptomatic A. Flutter. Procedure: Using 50 mg of IV Propofol and 20 IV Lidocaine (for reducing venous pain) for achieving deep sedation, synchronized direct current cardioversion performed. Patient was delivered with 120 Joules of electricity X 1 with success to A-Paced Rhythm. Patient tolerated the procedure well. No immediate complication noted.  Carotid artery duplex 09/06/2020:  Duplex suggests stenosis in the right internal carotid artery (16-49%).  Duplex suggests stenosis in the right external carotid artery (<50%).  Duplex suggests stenosis in the left internal carotid artery (16-49%).  Antegrade right vertebral artery flow. Antegrade left vertebral artery flow.  Compared to 02/09/2014, mild progression of  disease in bilateral carotid arteries. Follow up in one year is appropriate if clinically indicated.  PCV ECHOCARDIOGRAM COMPLETE 11/19/2020 Left ventricle cavity is normal in size. Moderate concentric hypertrophy of the left ventricle. Normal global wall motion. Normal LV systolic function with EF 55%. Diastolic function could not be assessed due to paced rhythm. Left atrial cavity is mildly dilated. Mild thickening of mitral valve. Mildly restricted mitral valve leaflets without definite stenosis.  Moderate (Grade III) mitral regurgitation. Mild tricuspid regurgitation. Estimated pulmonary artery systolic pressure 37 mmHg. Previous study in 2020 noted severe LA dilatation. No other significant change noted.  Pacemaker:   Remote dual-chamber pacemaker transmission 11/18/2021: 99%, VP 11%.  Longevity 8 years, 0 months.  Lead impedance and thresholds are normal.   There were no high ventricular rate episodes, no high atrial rate episodes.  Normal device function.  Scheduled  In office pacemaker check 01/08/21  Single (S)/Dual (D)/BV: D. Presenting AP-VS Pacemaker dependant:  No. Underlying NSR. AP 96.5%, VP 1.2%. AMS Episodes 0.  AT/AF burden 0% . Longest NA. HVR 0. Longevity 8.5 Years. Magnet rate: >85%. Lead measurements: Stable. Histogram: Low (L)/normal (N)/high (H)  Normal. Patient activity Low.   Observations: Normal dual-chamber pacemaker function. Changes: No changes.   EKG:   EKG 01/30/2022: Atrially paced and ventricular sensed rhythm at rate of 60 bpm, left axis deviation, left anterior fascicular block.  LVH with repolarization abnormality.  Compared to 07/09/2021, no significant change.   Assessment     ICD-10-CM   1. Chronic diastolic heart failure (HCC)  I50.32 EKG 12-Lead    2. Essential hypertension  I10     3. Paroxysmal atrial fibrillation (HCC)  I48.0     4. Radiation proctitis  K62.7 colesevelam (WELCHOL) 625 MG tablet    5. Bilateral carotid artery stenosis  I65.23 PCV CAROTID DUPLEX (BILATERAL)    6. Pacemaker  Medtronic  Medtronic Adapta Dr Peggyann Juba, leads only MRI  in situ 04/30/2015  Z95.0       Meds ordered this encounter  Medications   colesevelam (WELCHOL) 625 MG tablet    Sig: Take 2 tablets (1,250 mg total) by mouth 2 (two) times daily with a meal.    Dispense:  60 tablet    Refill:  2    There are no discontinued medications.   This patients CHA2DS2-VASc Score 7 (CHF, HTN, DM, Vasc, Age, F) and yearly risk of stroke >9.8%.   Recommendations:   GUDELIA EUGENE  is a 77 y.o. Caucasian female patient with primary hypertension, diabetes mellitus with stage IIIa chronic kidney disease, chronic diastolic heart failure, hypercholesterolemia, nonobstructive CAD (Cath 2019), subsequent atrial fibrillation and sick sinus syndrome status post dual-chamber pacemaker 2017, OSA on BiPAP.  Her last cardioversion was  09/07/2018 for atrial flutter.  1. Chronic diastolic heart failure ([place]) Patient fortunately has not had any recent exacerbation and heart failure.  Dyspnea has remained stable.  Her main issue has been with management of radiation-induced proctitis and fecal incontinence.  I did not make any changes to her medications.  2. Essential hypertension Although blood pressure is mildly elevated, her blood pressure has been very well-controlled on home recordings, blood pressure around 130 mmHg.  Continue present medications.  Do not want to be too aggressive as she also has stage IIIb chronic kidney disease.  3. Paroxysmal atrial fibrillation (HCC) She is maintaining sinus rhythm, she is also tolerating Eliquis without bleeding diathesis.  Atrial fibrillation burden has been nearly 0 over the past several  months.  4. Pacemaker  Medtronic  Medtronic Adapta Dr Peggyann Juba, leads only MRI  in situ 04/30/2015 I reviewed the results of the pacemaker transmission, she has been doing it on a monthly basis, recent transmission from yesterday also reviewed with the patient, she has normal pacemaker function and has not had any recent atrial fibrillation.  5. Radiation proctitis I would like to try colesevelam to see whether it can induce constipation and help her with radiation-induced proctitis and fecal incontinence which has become a social issue.  She is willing to try this.  6.  Asymptomatic bilateral carotid artery stenosis Patient has about a 40 to 50% stenosis bilateral ICA, she needs surveillance duplex in 6 months prior to her next office visit.   Follow-up in 6 months, sooner if needed.   Adrian Prows, MD, [facility] 01/30/2022, 5:29 PM Office: [phone] Fax: [phone] Pager: [phone]

## 2022-01-31 ENCOUNTER — Ambulatory Visit: Payer: Medicare HMO | Admitting: Cardiology

## 2022-02-01 DIAGNOSIS — I5033 Acute on chronic diastolic (congestive) heart failure: Secondary | ICD-10-CM | POA: Diagnosis not present

## 2022-02-01 DIAGNOSIS — R069 Unspecified abnormalities of breathing: Secondary | ICD-10-CM | POA: Diagnosis not present

## 2022-02-04 ENCOUNTER — Ambulatory Visit: Payer: Medicare HMO | Admitting: Cardiology

## 2022-02-04 DIAGNOSIS — Z950 Presence of cardiac pacemaker: Secondary | ICD-10-CM

## 2022-02-04 DIAGNOSIS — I495 Sick sinus syndrome: Secondary | ICD-10-CM

## 2022-02-04 DIAGNOSIS — Z45018 Encounter for adjustment and management of other part of cardiac pacemaker: Secondary | ICD-10-CM

## 2022-02-04 NOTE — Progress Notes (Signed)
Chief Complaint  Patient presents with   Pacemaker Check   Encounter for care of pacemaker  Pacemaker  Medtronic  Medtronic Adapta Dr ADDRL1, leads only MRI  in situ 04/30/2015  Sinus node dysfunction ([place])  Remote dual-chamber pacemaker transmission 11/18/2021: AP 99%, VP 11%.  Longevity 8 years, 0 months.  Lead impedance and thresholds are normal.  There were no high ventricular rate episodes, no high atrial rate episodes.  Normal device function.  Scheduled  In office pacemaker check 02/04/2022 Single (S)/Dual (D)/BV: D. Presenting AP-VS Pacemaker dependant:  No. Underlying NSR. AP 99.8%, VP 10.2%. AMS Episodes 0.  AT/AF burden 0% . Longest NA. HVR 0. Longevity 8 Years. Magnet rate: >85%. Lead measurements: Stable. Histogram: Low (L)/normal (N)/high (L)  Normal. Patient activity Low.   Observations: Normal dual-chamber pacemaker function. Changes:  Turned on rate response, Changed DDI program to MVP-R (AAiR to DDDR). Changed pulse width to 0.40 from 0.52.

## 2022-02-07 DIAGNOSIS — G4733 Obstructive sleep apnea (adult) (pediatric): Secondary | ICD-10-CM | POA: Diagnosis not present

## 2022-02-12 DIAGNOSIS — I129 Hypertensive chronic kidney disease with stage 1 through stage 4 chronic kidney disease, or unspecified chronic kidney disease: Secondary | ICD-10-CM | POA: Diagnosis not present

## 2022-02-12 DIAGNOSIS — N1831 Chronic kidney disease, stage 3a: Secondary | ICD-10-CM | POA: Diagnosis not present

## 2022-02-12 DIAGNOSIS — E1122 Type 2 diabetes mellitus with diabetic chronic kidney disease: Secondary | ICD-10-CM | POA: Diagnosis not present

## 2022-02-17 DIAGNOSIS — I495 Sick sinus syndrome: Secondary | ICD-10-CM | POA: Diagnosis not present

## 2022-02-17 DIAGNOSIS — Z45018 Encounter for adjustment and management of other part of cardiac pacemaker: Secondary | ICD-10-CM | POA: Diagnosis not present

## 2022-02-18 ENCOUNTER — Other Ambulatory Visit: Payer: Self-pay | Admitting: Cardiology

## 2022-02-25 ENCOUNTER — Other Ambulatory Visit: Payer: Self-pay | Admitting: Cardiology

## 2022-03-04 DIAGNOSIS — R069 Unspecified abnormalities of breathing: Secondary | ICD-10-CM | POA: Diagnosis not present

## 2022-03-04 DIAGNOSIS — I5033 Acute on chronic diastolic (congestive) heart failure: Secondary | ICD-10-CM | POA: Diagnosis not present

## 2022-03-12 DIAGNOSIS — E559 Vitamin D deficiency, unspecified: Secondary | ICD-10-CM | POA: Diagnosis not present

## 2022-03-12 DIAGNOSIS — E039 Hypothyroidism, unspecified: Secondary | ICD-10-CM | POA: Diagnosis not present

## 2022-03-12 DIAGNOSIS — I129 Hypertensive chronic kidney disease with stage 1 through stage 4 chronic kidney disease, or unspecified chronic kidney disease: Secondary | ICD-10-CM | POA: Diagnosis not present

## 2022-03-12 DIAGNOSIS — E785 Hyperlipidemia, unspecified: Secondary | ICD-10-CM | POA: Diagnosis not present

## 2022-03-12 DIAGNOSIS — E1122 Type 2 diabetes mellitus with diabetic chronic kidney disease: Secondary | ICD-10-CM | POA: Diagnosis not present

## 2022-03-23 ENCOUNTER — Other Ambulatory Visit: Payer: Self-pay | Admitting: Cardiology

## 2022-03-23 DIAGNOSIS — I10 Essential (primary) hypertension: Secondary | ICD-10-CM

## 2022-03-23 DIAGNOSIS — I2510 Atherosclerotic heart disease of native coronary artery without angina pectoris: Secondary | ICD-10-CM

## 2022-03-27 ENCOUNTER — Inpatient Hospital Stay: Payer: Medicare HMO | Attending: Gynecologic Oncology | Admitting: Gynecologic Oncology

## 2022-03-27 ENCOUNTER — Other Ambulatory Visit: Payer: Self-pay

## 2022-03-27 ENCOUNTER — Encounter: Payer: Self-pay | Admitting: Gynecologic Oncology

## 2022-03-27 VITALS — BP 124/51 | HR 93 | Temp 98.6°F | Wt 193.0 lb

## 2022-03-27 DIAGNOSIS — L900 Lichen sclerosus et atrophicus: Secondary | ICD-10-CM | POA: Diagnosis not present

## 2022-03-27 DIAGNOSIS — Z8544 Personal history of malignant neoplasm of other female genital organs: Secondary | ICD-10-CM

## 2022-03-27 DIAGNOSIS — Z923 Personal history of irradiation: Secondary | ICD-10-CM | POA: Diagnosis not present

## 2022-03-27 DIAGNOSIS — N3946 Mixed incontinence: Secondary | ICD-10-CM

## 2022-03-27 DIAGNOSIS — C519 Malignant neoplasm of vulva, unspecified: Secondary | ICD-10-CM

## 2022-03-27 DIAGNOSIS — R197 Diarrhea, unspecified: Secondary | ICD-10-CM

## 2022-03-27 DIAGNOSIS — R195 Other fecal abnormalities: Secondary | ICD-10-CM

## 2022-03-27 NOTE — Patient Instructions (Addendum)
It was good to see you today.  I do not see or feel any evidence of cancer recurrence on your exam.  We will [facility] to get you scheduled to speak with somebody about your urinary leakage.  I will see you for follow-up in 3 months.  As always, if you develop any new and concerning symptoms before your next visit, please call to see me sooner.

## 2022-03-27 NOTE — Progress Notes (Signed)
Gynecologic Oncology Return Clinic Visit  03/27/22  Reason for Visit: Follow-up in the setting of vulvar cancer    Treatment History: Oncology History  Vulvar cancer ([place])  07/20/2020 Initial Diagnosis   Vulvar cancer ([place])   07/26/2020 Surgery   EUA, vulvar biopsies  Findings: Pictures taken in media.  On exam, there is about a 4 x 3-1/2 cm area concerning for involvement by carcinoma that spans between the clitoris (replacing the bottom aspect of the clitoris) to just superior to the urethra.  This is a butterfly lesion that extends along the inner labia bilaterally.  Previous biopsy site is healing well with suture still intact.  There is a firmer and raised area that is approximately 1 cm around the area of the biopsy.  Biopsies taken circumferentially around the lesion noted as well as from the central aspect to help delineate what is cancer and what may be chronic dermatoses.  Given appearance today with patient more comfortable, I am suspicious that the entire lesion is carcinoma.   07/26/2020 Pathology Results   A. VULVA, 6 OCLOCK, INFERIOR ASPECT, BIOPSY:  - Superficial fragments of squamous cell carcinoma.   B. VULVA, 11 OCLOCK, BIOPSY:  - Invasive squamous cell carcinoma.  - No lymphovascular invasion.   C. VULVA, 2 OCLOCK, BIOPSY:  - Invasive squamous cell carcinoma.  - No lymphovascular invasion.   D. VULVA, CENTRAL, BIOPSY:  - Superficial fragment of squamous cell carcinoma.    08/06/2020 Imaging   PET: no evidence of metastatic disease   08/07/2020 Clinical Stage   Stage IB   08/22/2020 Surgery   Partial radical anterior vulvectomy with right inguinal SLN biopsy.  Findings: 4x4cm butterfly lesion on the anterior vulva, spanning from just superior to the clitoris to just superior to the urethral meatus. Lesion crossed the midline, more significant lesion on the right. LSG prior to surgery showed bilateral mapping. No hot or blue SLN identified on the left, mildly blue  and hot node identified as the SLN on the right. Decision made to abort left-sided LND given hypertension and difficulty ventilating. Additionally, given intra-op cardiac and respiratory status, decision made to proceed with a less radical resection (very close if not positive peri-urethral margin visibly.   08/22/2020 Pathology Results   Stage IB, lymph node assessment only on the right given comorbidities and cardiac issues during surgery  Tumor Focality:    Unifocal     Tumor Site:    Right vulva     Tumor Site:    Left vulva     Tumor Size:    Greatest Dimension (Centimeters): 4 cm     Histologic Type:    Squamous cell carcinoma, HPV-independent     Histologic Grade:    G3, poorly differentiated     Depth of Tumor Invasion:    6 mm     Tumor Border:    Infiltrating     Other Tissue / Organ Involvement:    Not applicable     Lymphovascular Invasion:    Not identified   MARGINS     Margin Status for Invasive Carcinoma:    All margins negative for invasive carcinoma       Closest Margin(s) to Invasive Carcinoma:    Peripheral: 12:00       Distance from Invasive Carcinoma to Closest Margin:    1 mm     Margin Status for HSIL (VIN2-3) or dVIN:    All margins negative for high-grade squamous intraepithelial lesion (HSIL) and /  or differentiated vulvar intraepithelial neoplasia (dVIN)   REGIONAL LYMPH NODES     Regional Lymph Node Status:           :    All regional lymph nodes negative for tumor cells       Total Number of Lymph Nodes Examined:    1         Nodal Site(s) Examined:    Right inguinal       Number of Sentinel Nodes Examined:    1    09/06/2020 Genetic Testing   Negative genetic testing:  No pathogenic variants detected on the [facility] Multi-Cancer + RNA panel. A variant of uncertain significance (VUS) was detected in the POLD1 gene called c.427G>A (p.Gly143Ser). The report date is 09/06/2020.  The Multi-Cancer + RNA Panel offered by [facility] includes sequencing and/or  deletion/duplication analysis of the following 84 genes:  AIP*, ALK, APC*, ATM*, AXIN2*, BAP1*, BARD1*, BLM*, BMPR1A*, BRCA1*, BRCA2*, BRIP1*, CASR, CDC73*, CDH1*, CDK4, CDKN1B*, CDKN1C*, CDKN2A, CEBPA, CHEK2*, CTNNA1*, DICER1*, DIS3L2*, EGFR, EPCAM, FH*, FLCN*, GATA2*, GPC3, GREM1, HOXB13, HRAS, KIT, MAX*, MEN1*, MET, MITF, MLH1*, MSH2*, MSH3*, MSH6*, MUTYH*, NBN*, NF1*, NF2*, NTHL1*, PALB2*, PDGFRA, PHOX2B, PMS2*, POLD1*, POLE*, POT1*, PRKAR1A*, PTCH1*, PTEN*, RAD50*, RAD51C*, RAD51D*, RB1*, RECQL4, RET, RUNX1*, SDHA*, SDHAF2*, SDHB*, SDHC*, SDHD*, SMAD4*, SMARCA4*, SMARCB1*, SMARCE1*, STK11*, SUFU*, TERC, TERT, TMEM127*, Tp53*, TSC1*, TSC2*, VHL*, WRN*, and WT1.  RNA analysis is performed for * genes.    10/23/2020 - 12/14/2020 Radiation Therapy   10/23/2020 through 12/14/2020 Site Technique Total Dose (Gy) Dose per Fx (Gy) Completed Fx Beam Energies  Vulva: Pelvis IMRT 50.4/50.4 1.8 28/28 6X  Vulva: Pelvis_Bst IMRT 9/9 1.8 5/5 6X        06/21/21: vulvar biopsy Squamous cell hyperplasia, hyperkeratosis, nonspecific inflammation. Changes c/w hypertrophic lichen sclerosus. No in situ or invasive carcinoma.  123XX123: Vulvar biopsy Lichen sclersus with overlying changes of condyloma accuminatum.  Interval History: Reports overall doing well.  Denies any significant change in bowel function.  Continues to struggle with diarrhea despite using Metamucil and Imodium.  Has follow-up with GI in April.  Continues to also endorse urinary incontinence and frequency.  Has both stress and urge urinary incontinence.  She denies any pelvic pain.  Denies any vulvar itching.  Has used clobetasol only a couple of times since her last visit, typically if she feels that things are "raw".  Past Medical/Surgical History: Past Medical History:  Diagnosis Date   Arthritis    CAD (coronary artery disease)    Diabetes mellitus without complication (HCC)    Diastolic heart failure ([place])    Dyspnea    Encounter for  care of pacemaker 09/03/2018   Family history of kidney cancer    Family history of throat cancer    Family history of uterine cancer    History of chickenpox    History of radiation therapy    Pelvis 10/23/20-12/14/20- Dr. Gery Pray   Hx of psoriatic arthritis    Hyperlipemia    Hypertension    Hypertension 05/03/2018   Hypothyroidism    Mitral valve disorders(424.0)    Myocardial infarction (HCC)    Obstructive sleep apnea    OSA (obstructive sleep apnea)    using BiPAP   Pacemaker    Paroxysmal atrial fibrillation ([place]) 10/11/2016   Peripheral neuropathy    Renal disorder Kidney disease stage2   Sick sinus syndrome [facility])    S/p dual-chamber pacemaker   Thyroid disease hypothyroid   Urine incontinence  Vitamin D deficiency    Vulvar cancer ([place]) 2022   s/p surgery and radiation therapy October-December 2022    Past Surgical History:  Procedure Laterality Date   BACK SURGERY     BACK SURGERY     BIOPSY  11/20/2021   Procedure: BIOPSY;  Surgeon: Daneil Dolin, MD;  [facility];  Service: Endoscopy;;   CARDIAC CATHETERIZATION N/A 04/29/2015   Procedure: Temporary Pacemaker;  Surgeon: Adrian Prows, MD;  Location: [place] [facility];  Service: Cardiovascular;  Laterality: N/A;   CARDIOVERSION N/A 09/07/2018   Procedure: CARDIOVERSION;  Surgeon: Adrian Prows, MD;  Location: [place];  Service: Cardiovascular;  Laterality: N/A;   CARDIOVERSION     COLONOSCOPY  08/16/2008   Dr. Gala Romney; pancolonic diverticulosis, otherwise no abnormalities.  Consider repeat in 10 years.   COLONOSCOPY WITH PROPOFOL N/A 11/20/2021   Procedure: COLONOSCOPY WITH PROPOFOL;  Surgeon: Daneil Dolin, MD;  [facility];  Service: Endoscopy;  Laterality: N/A;  8:15am, asa 3   EP IMPLANTABLE DEVICE N/A 04/30/2015   Procedure: Pacemaker Implant;  Surgeon: Evans Lance, MD;  Location: [place] [facility];  Service: Cardiovascular;  Laterality: N/A;   INTRAVASCULAR PRESSURE  WIRE/FFR STUDY N/A 08/31/2017   Procedure: INTRAVASCULAR PRESSURE WIRE/FFR STUDY;  Surgeon: Nigel Mormon, MD;  Location: [place] [facility];  Service: Cardiovascular;  Laterality: N/A;   LEFT AND RIGHT HEART CATHETERIZATION WITH CORONARY ANGIOGRAM N/A 09/30/2011   Procedure: LEFT AND RIGHT HEART CATHETERIZATION WITH CORONARY ANGIOGRAM;  Surgeon: Laverda Page, MD;  Location: [facility] CATH LAB;  Service: Cardiovascular;  Laterality: N/A;   POLYPECTOMY  11/20/2021   Procedure: POLYPECTOMY;  Surgeon: Daneil Dolin, MD;  [facility];  Service: Endoscopy;;   RIGHT/LEFT HEART CATH AND CORONARY ANGIOGRAPHY N/A 08/31/2017   Procedure: RIGHT/LEFT HEART CATH AND CORONARY ANGIOGRAPHY;  Surgeon: Nigel Mormon, MD;  Location: [place] [facility] LAB;  Service: Cardiovascular;  Laterality: N/A;   TOOTH EXTRACTION  10/07/2018   TUBAL LIGATION     VULVA Milagros Loll BIOPSY N/A 07/26/2020   Procedure: VULVAR BIOPSY;  Surgeon: Lafonda Mosses, MD;  [facility];  Service: Gynecology;  Laterality: N/A;   YAG LASER APPLICATION Right [id number]   Procedure: YAG LASER APPLICATION;  Surgeon: Elta Guadeloupe T. Gershon Crane, MD;  Location[facility];  Service: Ophthalmology;  Laterality: Right;  pt knows to arrive at [id number]   YAG LASER APPLICATION Left [id number]   Procedure: YAG LASER APPLICATION;  Surgeon: Rutherford Guys, MD;  Location[facility];  Service: Ophthalmology;  Laterality: Left;    Family History  Problem Relation Age of Onset   Arthritis Mother    Diabetes Mother    Heart disease Mother    Hyperlipidemia Mother    Hypertension Mother    Arthritis Father    Asthma Father    Heart attack Father    Hyperlipidemia Father    Hypertension Father    Stroke Father    Arthritis Sister    Diabetes Sister    Hypertension Sister    Arthritis Sister    Diabetes Sister    Hyperlipidemia Sister    Hypertension Sister    Stroke Sister    Ovarian cancer Sister 21   Pancreatic cancer Sister 16    Uterine cancer Sister 57   Hyperlipidemia Sister    Hypertension Sister    Arthritis Brother    Heart attack Brother    Heart disease Brother    Hyperlipidemia  Brother    Hypertension Brother    Alcohol abuse Brother    Arthritis Brother    Diabetes Brother    Early death Brother    Heart disease Brother    Hyperlipidemia Brother    Hypertension Brother    Cancer Maternal Aunt 70       unknown type   Throat cancer Maternal Grandfather        hx smoking/drinking   Hypertension Daughter    Cancer Daughter        "female cancer cells"   Kidney cancer Nephew        dx 74s   Colon cancer Neg Hx    Breast cancer Neg Hx    Prostate cancer Neg Hx     Social History   Socioeconomic History   Marital status: Widowed    Spouse name: Not on file   Number of children: 3   Years of education: Not on file   Highest education level: Not on file  Occupational History   Not on file  Tobacco Use   Smoking status: Never   Smokeless tobacco: Never  Vaping Use   Vaping Use: Never used  Substance and Sexual Activity   Alcohol use: No    Alcohol/week: 0.0 standard drinks of alcohol   Drug use: No   Sexual activity: Not Currently    Birth control/protection: Post-menopausal  Other Topics Concern   Not on file  Social History Narrative   Husband recently passed away on 05-11-2020.   Social Determinants of Health   Financial Resource Strain: Not on file  Food Insecurity: Not on file  Transportation Needs: Not on file  Physical Activity: Not on file  Stress: Not on file  Social Connections: Not on file    Current Medications:  Current Outpatient Medications:    acetaminophen (TYLENOL) 500 MG tablet, Take 500-1,000 mg by mouth every 6 (six) hours as needed (pain.)., Disp: , Rfl:    amiodarone (PACERONE) 200 MG tablet, Take 1/2 (one-half) tablet by mouth once daily (Patient taking differently: Take 100 mg by mouth daily.), Disp: 45 tablet, Rfl: 3   Blood Glucose  Monitoring Suppl (ACCU-CHEK AVIVA PLUS) w/Device KIT, , Disp: , Rfl:    cholecalciferol (VITAMIN D3) 25 MCG (1000 UNIT) tablet, Take 1,000 Units by mouth in the morning., Disp: , Rfl:    dapagliflozin propanediol (FARXIGA) 10 MG TABS tablet, Take 1 tablet (10 mg total) by mouth daily before breakfast., Disp: 90 tablet, Rfl: 3   diphenhydramine-acetaminophen (TYLENOL PM) 25-500 MG TABS tablet, Take 1 tablet by mouth at bedtime as needed (sleep)., Disp: , Rfl:    ELIQUIS 5 MG TABS tablet, Take 1 tablet by mouth twice daily, Disp: 180 tablet, Rfl: 0   fenofibrate 160 MG tablet, Take 160 mg by mouth daily with supper. , Disp: , Rfl:    insulin aspart protamine- aspart (NOVOLOG MIX 70/30) (70-30) 100 UNIT/ML injection, Inject 30-36 Units into the skin See admin instructions. Inject 36 units subcutaneously with breakfast & inject 30 units subcutaneously with supper (may adjust based on blood sugar readings), Disp: , Rfl:    isosorbide mononitrate (IMDUR) 60 MG 24 hr tablet, Take 1 tablet by mouth once daily, Disp: 90 tablet, Rfl: 0   levothyroxine (SYNTHROID) 137 MCG tablet, Take 137 mcg by mouth daily before breakfast., Disp: , Rfl:    metoprolol tartrate (LOPRESSOR) 50 MG tablet, Take 1 tablet by mouth twice daily, Disp: 180 tablet, Rfl: 0  nitroGLYCERIN (NITROSTAT) 0.4 MG SL tablet, Place 1 tablet (0.4 mg total) under the tongue every 5 (five) minutes as needed for chest pain., Disp: 25 tablet, Rfl: 1   olmesartan-hydrochlorothiazide (BENICAR HCT) 40-25 MG tablet, Take 1 tablet by mouth once daily, Disp: 90 tablet, Rfl: 0   psyllium (REGULOID) 0.52 g capsule, Take 0.52 g by mouth every evening., Disp: , Rfl:    spironolactone (ALDACTONE) 25 MG tablet, Take 1 tablet by mouth once daily (Patient taking differently: Take 25 mg by mouth every evening.), Disp: 90 tablet, Rfl: 3   torsemide (DEMADEX) 10 MG tablet, Take 1 tablet (10 mg total) by mouth as needed., Disp: 30 tablet, Rfl: 3  Review of Systems: +  Diarrhea, urinary frequency and incontinence, back pain. Denies appetite changes, fevers, chills, fatigue, unexplained weight changes. Denies hearing loss, neck lumps or masses, mouth sores, ringing in ears or voice changes. Denies cough or wheezing.  Denies shortness of breath. Denies chest pain or palpitations. Denies leg swelling. Denies abdominal distention, pain, blood in stools, constipation, nausea, vomiting, or early satiety. Denies pain with intercourse, dysuria, hematuria. Denies hot flashes, pelvic pain, vaginal bleeding or vaginal discharge.   Denies joint pain or muscle pain/cramps. Denies itching, rash, or wounds. Denies dizziness, headaches, numbness or seizures. Denies swollen lymph nodes or glands, denies easy bruising or bleeding. Denies anxiety, depression, confusion, or decreased concentration.  Physical Exam: BP (!) 124/51 (BP Location: Left Arm, Patient Position: Sitting)   Pulse 93   Temp 98.6 F (37 C) (Oral)   Wt 193 lb (87.5 kg)   SpO2 94%   BMI 35.30 kg/m  General: Alert, oriented, no acute distress. HEENT: Posterior oropharynx clear, sclera anicteric. Chest: Clear to auscultation bilaterally.  No wheezes or rhonchi. Cardiovascular: Regular rate and rhythm, no murmurs. Abdomen: Obese, soft, nontender.  Normoactive bowel sounds.  No masses or hepatosplenomegaly appreciated.  Extremities: Grossly normal range of motion.  Warm, well perfused.  Trace edema bilaterally. Skin: No rashes or lesions noted. Lymphatics: No cervical, supraclavicular, or inguinal adenopathy. GU: External female genitalia atrophic with radiation changes present especially along the perineum and posterior fourchette.  No inguinal adenopathy appreciated.  There is an area of leukoplakia anteriorly as well as some leukoplakia along the perineum, both stable from last visit.  After application of acetic acid, no acetowhite changes, no change to leukoplakia.  Laboratory & Radiologic  Studies: 12/24/21: ANTERIOR VULVA, BIOPSY: LICHEN SCLEROSUS WITH OVERLYING CHANGES OF  CONDYLOMA ACCUMINATUM (VIN1)   MICROSCOPIC DESCRIPTION: There is epidermal hyperplasia with focal  vacuolar change in the squamous epithelium. The epidermis has an effaced  rete peg pattern with hyalinization of the papillary dermis with some  vascular ectasia and a lymphocytic infiltrate that is focally band-like  beneath the hyalinized papillary dermis. The p16 stain is negative.   The slides were also reviewed by Dr. Cristina Gong, who is in general  agreement with the diagnosis.   Assessment & Plan: [name] is a 77 y.o. woman with  at least Stage IB grade 3 SCC of the vulva, HPV - independent, who presents for surveillance. Completed adjuvant radiation in 12/2020.   Patient is overall doing well from a vulvar cancer standpoint.  Biopsy taken at her last visit from the anterior vulva revealed lichen sclerosis with VIN 1 changes.  No significant difference on her exam today.  Given continued urinary incontinence, mixed, we discussed urology or urogynecology referral.  Patient was amenable.  She has follow-up with GI in April  regarding her chronic diarrhea.   Per NCCN surveillance recommendations, we will continue with visits every 3 months.  She continues to prefer to have this done solely in our clinic.  We reviewed signs and symptoms that would be concerning for disease recurrence, and I have stressed the importance of her calling if she develops any of these between visits.  22 minutes of total time was spent for this patient encounter, including preparation, face-to-face counseling with the patient and coordination of care, and documentation of the encounter.  Jeral Pinch, MD  Division of Gynecologic Oncology  Department of Obstetrics and Gynecology  [facility]

## 2022-03-31 ENCOUNTER — Other Ambulatory Visit: Payer: Self-pay | Admitting: Cardiology

## 2022-03-31 DIAGNOSIS — I2510 Atherosclerotic heart disease of native coronary artery without angina pectoris: Secondary | ICD-10-CM

## 2022-03-31 DIAGNOSIS — I10 Essential (primary) hypertension: Secondary | ICD-10-CM

## 2022-04-02 DIAGNOSIS — R069 Unspecified abnormalities of breathing: Secondary | ICD-10-CM | POA: Diagnosis not present

## 2022-04-02 DIAGNOSIS — I5033 Acute on chronic diastolic (congestive) heart failure: Secondary | ICD-10-CM | POA: Diagnosis not present

## 2022-04-03 ENCOUNTER — Other Ambulatory Visit: Payer: Self-pay

## 2022-04-03 MED ORDER — APIXABAN 5 MG PO TABS: 5.0000 mg | ORAL_TABLET | Freq: Two times a day (BID) | ORAL | 2 refills | Status: DC

## 2022-04-06 ENCOUNTER — Other Ambulatory Visit: Payer: Self-pay | Admitting: Cardiology

## 2022-04-13 DIAGNOSIS — N183 Chronic kidney disease, stage 3 unspecified: Secondary | ICD-10-CM | POA: Diagnosis not present

## 2022-04-13 DIAGNOSIS — E1122 Type 2 diabetes mellitus with diabetic chronic kidney disease: Secondary | ICD-10-CM | POA: Diagnosis not present

## 2022-04-13 DIAGNOSIS — I129 Hypertensive chronic kidney disease with stage 1 through stage 4 chronic kidney disease, or unspecified chronic kidney disease: Secondary | ICD-10-CM | POA: Diagnosis not present

## 2022-04-14 ENCOUNTER — Telehealth: Payer: Self-pay

## 2022-04-14 ENCOUNTER — Encounter: Payer: Self-pay | Admitting: Obstetrics & Gynecology

## 2022-04-14 NOTE — Telephone Encounter (Signed)
Ms Shine called stating that she has an open sore the size of a Q-tip on her vulvar on the left side in the middle of the suture line from previous surgery. She has been applying the Clobetasol cream to the affected area after she toilets which is at least 3-4 times a day. She also has to wear a pad for urinary leakage.Since her cream is getting low she would intermittently apply ATB ointment to the affected area. The pain is a 5/10 especially when she is up and moving around and is intermittent. No itching noticed. Told Ms Ventry that the above was reviewed with Melissa Cross,NP. She stated that she could come in and see Dr. Lahoma Crocker on Wednesday 4-3 at 1245 pm.  Melissa recommends to use the clobetasol cream once a day until seen on 04-16-22 as it can thin the skin. Pt verbalized understanding.

## 2022-04-15 ENCOUNTER — Telehealth: Payer: Self-pay | Admitting: *Deleted

## 2022-04-15 NOTE — Telephone Encounter (Signed)
Per Dr Berline Lopes fax records and referral form to [facility]

## 2022-04-16 ENCOUNTER — Encounter: Payer: Self-pay | Admitting: Obstetrics & Gynecology

## 2022-04-16 ENCOUNTER — Inpatient Hospital Stay: Payer: Medicare HMO | Attending: Gynecologic Oncology | Admitting: Obstetrics & Gynecology

## 2022-04-16 ENCOUNTER — Other Ambulatory Visit: Payer: Self-pay

## 2022-04-16 ENCOUNTER — Inpatient Hospital Stay: Payer: Medicare HMO

## 2022-04-16 VITALS — BP 129/52 | HR 94 | Temp 98.8°F | Resp 18 | Ht 62.0 in | Wt 193.8 lb

## 2022-04-16 DIAGNOSIS — Z8544 Personal history of malignant neoplasm of other female genital organs: Secondary | ICD-10-CM | POA: Diagnosis not present

## 2022-04-16 DIAGNOSIS — Z923 Personal history of irradiation: Secondary | ICD-10-CM | POA: Insufficient documentation

## 2022-04-16 DIAGNOSIS — C519 Malignant neoplasm of vulva, unspecified: Secondary | ICD-10-CM

## 2022-04-16 DIAGNOSIS — N9089 Other specified noninflammatory disorders of vulva and perineum: Secondary | ICD-10-CM | POA: Diagnosis not present

## 2022-04-16 LAB — WET PREP, GENITAL
Clue Cells Wet Prep HPF POC: NONE SEEN
Sperm: NONE SEEN
Trich, Wet Prep: NONE SEEN
WBC, Wet Prep HPF POC: <10 (ref <10)
Yeast Wet Prep HPF POC: NONE SEEN

## 2022-04-16 NOTE — Assessment & Plan Note (Signed)
[name] is a 77 y.o. woman with  at least Stage IB grade 3 SCC of the vulva, HPV - independent, who presents for surveillance. Completed adjuvant radiation in 12/2020.   Patient is overall doing well from a vulvar cancer standpoint.  Biopsy taken at her last visit from the anterior vulva revealed lichen sclerosis with VIN 1 changes.  No significant difference on her exam today.   Given continued urinary incontinence, mixed, we discussed urology or urogynecology referral.  Patient was amenable.   She has follow-up with GI in April regarding her chronic diarrhea.   Per NCCN surveillance recommendations, we will continue with visits every 3 months.  She continues to prefer to have this done solely in our clinic.  We reviewed signs and symptoms that would be concerning for disease recurrence, and I have stressed the importance of her calling if she develops any of these between visits.

## 2022-04-16 NOTE — Progress Notes (Signed)
Follow Up Note: Gyn-Onc  [name] 77 y.o. female  CC: New vulva sore   HPI: The oncology history was reviewed.  Interval History: Prior dx of VIN 1, vulva LS.  Referral made to urogynecology made for urinary incontinence at the last visit.  Comorbid issues include DM.  Per the pt glycemic control is adequate  The vulva soreness started at the last visit.  Very anxious re: possible cancer recurrence.  Review of Systems  Review of Systems  Constitutional:  Negative for malaise/fatigue and weight loss.  Respiratory:  Negative for shortness of breath and wheezing.   Cardiovascular:  Negative for chest pain and leg swelling.  Gastrointestinal:  Negative for abdominal pain, blood in stool, constipation, nausea and vomiting.  Genitourinary:  See above Musculoskeletal:  Negative for joint pain and myalgias.  Neurological:  Negative for weakness.  Psychiatric/Behavioral:  Negative for depression. The patient does not have insomnia.    Current medications, allergy, social history, past surgical history, past medical history, family history were all reviewed.    Vitals:  BP (!) 129/52 (BP Location: Left Arm, Patient Position: Sitting)   Pulse 94   Temp 98.8 F (37.1 C) (Oral)   Resp 18   Ht 5\' 2"  (1.575 m)   Wt 193 lb 12.8 oz (87.9 kg)   SpO2 96%   BMI 35.45 kg/m    Physical Exam:  Physical Exam Exam conducted with a chaperone present.  Constitutional:      General: She is not in acute distress. Cardiovascular:     Rate and Rhythm: Normal rate and regular rhythm.  Pulmonary:     Effort: Pulmonary effort is normal.     Breath sounds: Normal breath sounds. No wheezing or rhonchi.  Abdominal:     Palpations: Abdomen is soft.     Tenderness: There is no abdominal tenderness. There is no right CVA tenderness or left CVA tenderness.     Hernia: No hernia is present.  Genitourinary:    General: Shallow ulcers, white, waxy skin, telangietasias    Urethra: No urethral lesion.      Vagina: No lesions. No bleeding Musculoskeletal:     Cervical back: Neck supple.     Right lower leg: No edema.     Left lower leg: No edema.  Lymphadenopathy:     Upper Body:     Right upper body: No supraclavicular adenopathy.     Left upper body: No supraclavicular adenopathy.     Lower Body: No right inguinal adenopathy. ; left groin skin w/a white plaque, ecchymotic areas Skin:    Findings: No rash.  Neurological:     Mental Status: She is oriented to person, place, and time.       Assessment/Plan:  H/O vulva cancer now w/ irritative sxs.  Multiple contributing factors--radiation changes, vulva LS, IAD w/small pressure ulcers, vulvovaginitis 2/2 SGLT2 inhibitor Patient was reassured that there were no new lesions suspicious for recurrent malignancy  >review the wet prep findings  >optimize hygiene practices, moisturizers, skin protectant >continue topical steroid  >keep previously scheduled appointment w/Dr. Berline Lopes  I personally spent 25 minutes face-to-face and non-face-to-face in the care of this patient, which includes all pre, intra, and post visit time on the date of service.    Lahoma Crocker, MD

## 2022-04-16 NOTE — Patient Instructions (Signed)
Keep previously scheduled appointment with Dr. Berline Lopes.

## 2022-04-18 ENCOUNTER — Telehealth: Payer: Self-pay

## 2022-04-18 NOTE — Telephone Encounter (Signed)
[name] returned call and is aware of negative results

## 2022-04-18 NOTE — Telephone Encounter (Signed)
LVM for patient to call office regarding recent wet prep result from 4/3. Per Warner Mccreedy NP, negative result. Keep an eye on symptoms and call office if gets worse.

## 2022-04-22 ENCOUNTER — Ambulatory Visit: Payer: Medicare HMO | Admitting: Internal Medicine

## 2022-04-22 VITALS — BP 144/63 | HR 73 | Temp 97.2°F | Ht 62.0 in | Wt 195.0 lb

## 2022-04-22 DIAGNOSIS — R152 Fecal urgency: Secondary | ICD-10-CM | POA: Diagnosis not present

## 2022-04-22 DIAGNOSIS — R159 Full incontinence of feces: Secondary | ICD-10-CM | POA: Diagnosis not present

## 2022-04-22 MED ORDER — CHOLESTYRAMINE 4 G PO PACK: 4.0000 g | PACK | Freq: Every day | ORAL | 3 refills | Status: DC

## 2022-04-22 NOTE — Progress Notes (Signed)
Primary Care Physician:  Merri Brunette, MD Primary Gastroenterologist:  Dr. Jena Gauss  Pre-Procedure History & Physical: HPI:  [name] is a 77 y.o. female here for follow-up of incontinence of urine and feces.  History of vulvar cancer with radiation.  Incontinence of bowel started after radiation therapy.  Has been on Imodium 2 mg twice daily and Metamucil.  Cannot tell any improvement in symptoms.  Colonoscopy November demonstrated a  colon-  adenomas removed.  She is due for repeat exam in 3 years if overall health permits.  Quality of life is compromised with the fecal incontinence.  She denies rectal bleeding.  Past Medical History:  Diagnosis Date   Arthritis    CAD (coronary artery disease)    Diabetes mellitus without complication    Diastolic heart failure    Dyspnea    Encounter for care of pacemaker 09/03/2018   Family history of kidney cancer    Family history of throat cancer    Family history of uterine cancer    History of chickenpox    History of radiation therapy    Pelvis 10/23/20-12/14/20- Dr. Antony Blackbird   Hx of psoriatic arthritis    Hyperlipemia    Hypertension    Hypertension 05/03/2018   Hypothyroidism    Mitral valve disorders(424.0)    Myocardial infarction    Obstructive sleep apnea    OSA (obstructive sleep apnea)    using BiPAP   Pacemaker    Paroxysmal atrial fibrillation 10/11/2016   Peripheral neuropathy    Renal disorder Kidney disease stage2   Sick sinus syndrome    S/p dual-chamber pacemaker   Thyroid disease hypothyroid   Urine incontinence    Vitamin D deficiency    Vulvar cancer 2022   s/p surgery and radiation therapy October-December 2022    Past Surgical History:  Procedure Laterality Date   BACK SURGERY     BACK SURGERY     BIOPSY  11/20/2021   Procedure: BIOPSY;  Surgeon: Corbin Ade, MD;  [facility];  Service: Endoscopy;;   CARDIAC CATHETERIZATION N/A 04/29/2015   Procedure: Temporary  Pacemaker;  Surgeon: Yates Decamp, MD;  Location: [facility];  Service: Cardiovascular;  Laterality: N/A;   CARDIOVERSION N/A 09/07/2018   Procedure: CARDIOVERSION;  Surgeon: Yates Decamp, MD;  Location: [facility] ENDOSCOPY;  Service: Cardiovascular;  Laterality: N/A;   CARDIOVERSION     COLONOSCOPY  08/16/2008   Dr. Jena Gauss; pancolonic diverticulosis, otherwise no abnormalities.  Consider repeat in 10 years.   COLONOSCOPY WITH PROPOFOL N/A 11/20/2021   Procedure: COLONOSCOPY WITH PROPOFOL;  Surgeon: Corbin Ade, MD;  [facility];  Service: Endoscopy;  Laterality: N/A;  8:15am, asa 3   CORONARY PRESSURE/FFR STUDY N/A 08/31/2017   Procedure: INTRAVASCULAR PRESSURE WIRE/FFR STUDY;  Surgeon: Elder Negus, MD;  Location: [facility];  Service: Cardiovascular;  Laterality: N/A;   EP IMPLANTABLE DEVICE N/A 04/30/2015   Procedure: Pacemaker Implant;  Surgeon: Marinus Maw, MD;  Location: [facility] INVASIVE CV LAB;  Service: Cardiovascular;  Laterality: N/A;   LEFT AND RIGHT HEART CATHETERIZATION WITH CORONARY ANGIOGRAM N/A 09/30/2011   Procedure: LEFT AND RIGHT HEART CATHETERIZATION WITH CORONARY ANGIOGRAM;  Surgeon: Pamella Pert, MD;  Location: [facility] CATH LAB;  Service: Cardiovascular;  Laterality: N/A;   POLYPECTOMY  11/20/2021   Procedure: POLYPECTOMY;  Surgeon: Corbin Ade, MD;  [facility];  Service: Endoscopy;;   RIGHT/LEFT HEART CATH AND  CORONARY ANGIOGRAPHY N/A 08/31/2017   Procedure: RIGHT/LEFT HEART CATH AND CORONARY ANGIOGRAPHY;  Surgeon: Elder Negus, MD;  [facility]: [facility];  Service: Cardiovascular;  Laterality: N/A;   TOOTH EXTRACTION  10/07/2018   TUBAL LIGATION     VULVA Ples Specter BIOPSY N/A 07/26/2020   Procedure: VULVAR BIOPSY;  Surgeon: Carver Fila, MD;  Location: [facility];  Service: Gynecology;  Laterality: N/A;   YAG LASER APPLICATION Right 03/07/2014   Procedure: YAG LASER APPLICATION;  Surgeon: Loraine Leriche T. Nile Riggs, MD;   Location: [facility];  Service: Ophthalmology;  Laterality: Right;  pt knows to arrive at 11:15   YAG LASER APPLICATION Left 03/21/2014   Procedure: YAG LASER APPLICATION;  Surgeon: Jethro Bolus, MD;  Location: [facility];  Service: Ophthalmology;  Laterality: Left;    Prior to Admission medications   Medication Sig Start Date End Date Taking? Authorizing Provider  acetaminophen (TYLENOL) 500 MG tablet Take 500-1,000 mg by mouth every 6 (six) hours as needed (pain.).   Yes [provider]  amiodarone (PACERONE) 200 MG tablet Take 1/2 (one-half) tablet by mouth once daily Patient taking differently: Take 100 mg by mouth daily. 05/28/21  Yes Yates Decamp, MD  apixaban (ELIQUIS) 5 MG TABS tablet Take 1 tablet (5 mg total) by mouth 2 (two) times daily. 04/03/22  Yes Yates Decamp, MD  Blood Glucose Monitoring Suppl (ACCU-CHEK AVIVA PLUS) w/Device KIT    Yes [provider]  cholecalciferol (VITAMIN D3) 25 MCG (1000 UNIT) tablet Take 1,000 Units by mouth in the morning.   Yes [provider]  clobetasol cream (TEMOVATE) 0.05 % Apply 1 Application topically 2 (two) times daily.   Yes [provider]  dapagliflozin propanediol (FARXIGA) 10 MG TABS tablet Take 1 tablet (10 mg total) by mouth daily before breakfast. 02/18/22  Yes Yates Decamp, MD  diphenhydramine-acetaminophen (TYLENOL PM) 25-500 MG TABS tablet Take 1 tablet by mouth at bedtime as needed (sleep).   Yes [provider]  fenofibrate 160 MG tablet Take 160 mg by mouth daily with supper.    Yes [provider]  insulin aspart protamine- aspart (NOVOLOG MIX 70/30) (70-30) 100 UNIT/ML injection Inject 30-36 Units into the skin See admin instructions. Inject 36 units subcutaneously with breakfast & inject 30 units subcutaneously with supper (may adjust based on blood sugar readings)   Yes [provider]  isosorbide mononitrate (IMDUR) 60 MG 24 hr tablet Take 1 tablet by mouth once daily 04/01/22  Yes  Yates Decamp, MD  levothyroxine (SYNTHROID) 137 MCG tablet Take 137 mcg by mouth daily before breakfast.   Yes [provider]  Loperamide HCl (IMODIUM PO) Take 1 capsule by mouth 2 (two) times daily.   Yes [provider]  metoprolol tartrate (LOPRESSOR) 50 MG tablet Take 1 tablet by mouth twice daily 02/25/22  Yes Yates Decamp, MD  nitroGLYCERIN (NITROSTAT) 0.4 MG SL tablet Place 1 tablet (0.4 mg total) under the tongue every 5 (five) minutes as needed for chest pain. 10/01/15  Yes Orpah Cobb, MD  olmesartan-hydrochlorothiazide (BENICAR HCT) 40-25 MG tablet Take 1 tablet by mouth once daily 04/07/22  Yes Yates Decamp, MD  psyllium (REGULOID) 0.52 g capsule Take 0.52 g by mouth every evening.   Yes [provider]  spironolactone (ALDACTONE) 25 MG tablet Take 1 tablet by mouth once daily Patient taking differently: Take 25 mg by mouth every evening. 05/28/21  Yes Yates Decamp, MD  torsemide (DEMADEX) 10 MG tablet Take 1 tablet (10  mg total) by mouth as needed. 07/09/21  Yes Cantwell, Celeste C, PA-C    Allergies as of 04/22/2022 - Review Complete 04/22/2022  Allergen Reaction Noted   Hydrocodone Other (See Comments) 05-16-2015   Invokana [canagliflozin] Palpitations and Other (See Comments) 2015-05-16   Oxycodone Other (See Comments) May 16, 2015    Family History  Problem Relation Age of Onset   Arthritis Mother    Diabetes Mother    Heart disease Mother    Hyperlipidemia Mother    Hypertension Mother    Arthritis Father    Asthma Father    Heart attack Father    Hyperlipidemia Father    Hypertension Father    Stroke Father    Arthritis Sister    Diabetes Sister    Hypertension Sister    Arthritis Sister    Diabetes Sister    Hyperlipidemia Sister    Hypertension Sister    Stroke Sister    Ovarian cancer Sister 33   Pancreatic cancer Sister 9   Uterine cancer Sister 32   Hyperlipidemia Sister    Hypertension Sister    Arthritis Brother    Heart attack  Brother    Heart disease Brother    Hyperlipidemia Brother    Hypertension Brother    Alcohol abuse Brother    Arthritis Brother    Diabetes Brother    Early death Brother    Heart disease Brother    Hyperlipidemia Brother    Hypertension Brother    Cancer Maternal Aunt 17       unknown type   Throat cancer Maternal Grandfather        hx smoking/drinking   Hypertension Daughter    Cancer Daughter        "female cancer cells"   Kidney cancer Nephew        dx 66s   Colon cancer Neg Hx    Breast cancer Neg Hx    Prostate cancer Neg Hx     Social History   Socioeconomic History   Marital status: Widowed    Spouse name: Not on file   Number of children: 3   Years of education: Not on file   Highest education level: Not on file  Occupational History   Not on file  Tobacco Use   Smoking status: Never   Smokeless tobacco: Never  Vaping Use   Vaping Use: Never used  Substance and Sexual Activity   Alcohol use: No    Alcohol/week: 0.0 standard drinks of alcohol   Drug use: No   Sexual activity: Not Currently    Birth control/protection: Post-menopausal  Other Topics Concern   Not on file  Social History Narrative   Husband recently passed away on 2020/05/15.   Social Determinants of Health   Financial Resource Strain: Not on file  Food Insecurity: Not on file  Transportation Needs: Not on file  Physical Activity: Not on file  Stress: Not on file  Social Connections: Not on file  Intimate Partner Violence: Not on file    Review of Systems: See HPI, otherwise negative ROS  Physical Exam: BP (!) 147/65 (BP Location: Right Arm, Patient Position: Sitting, Cuff Size: Normal)   Pulse 73   Temp (!) 97.2 F (36.2 C) (Temporal)   Ht 5\' 2"  (1.575 m)   Wt 195 lb (88.5 kg)   SpO2 97%   BMI 35.67 kg/m  General:   Alert,  Well-developed, well-nourished, pleasant and cooperative in NAD Abdomen: Nondistended.  Positive  bowel sounds soft and nontender Pulses:   Normal pulses noted. Extremities:  Without clubbing or edema.  Impression/Plan: 77 year old lady with a history of vulvar cancer status post radiation therapy now with incontinence of stool and urine.  Likely radiation effect is playing a major role here. No significant improvement with low-dose Imodium and fiber. History of colonic adenomas removed last year.  Recommendations:  Stop Metamucil  Increase Imodium to 2 mg 4 times daily-every day  Begin Questran or cholestyramine 4 g packet in water once daily.  Remember not to take it 2 hours before or after any other medication.  Dispense 30 with 3 refills-[facility]  Office visit here in 6 weeks      Notice: This dictation was prepared with Dragon dictation along with smaller phrase technology. Any transcriptional errors that result from this process are unintentional and may not be corrected upon review.

## 2022-04-22 NOTE — Patient Instructions (Signed)
It was good to see you again today!  Stop Metamucil  Increase Imodium to 2 mg 4 times daily-every day  Begin Questran or cholestyramine 4 g packet in water once daily.  Remember not to take it 2 hours before or after any other medication.  Dispense 30 with 3 refills-[facility]  Office visit here in 6 weeks

## 2022-04-25 ENCOUNTER — Telehealth: Payer: Self-pay | Admitting: *Deleted

## 2022-04-25 NOTE — Telephone Encounter (Signed)
Received fax from [facility] that they were unable to reach the patient to schedule appt. Our office attempted to reach the patient at [phone] unable to leave message; no voicemail set up. Also tried to reach the patient at [phone],number not in service.

## 2022-04-25 NOTE — Telephone Encounter (Signed)
Thank you :)

## 2022-05-03 DIAGNOSIS — R069 Unspecified abnormalities of breathing: Secondary | ICD-10-CM | POA: Diagnosis not present

## 2022-05-03 DIAGNOSIS — I5033 Acute on chronic diastolic (congestive) heart failure: Secondary | ICD-10-CM | POA: Diagnosis not present

## 2022-05-10 IMAGING — DX DG CHEST 2V
2 series · 2 of 2 positions shown · non-contrast
Comparison: 08/30/2018

CLINICAL DATA: Shortness of breath

EXAM:
CHEST - 2 VIEW

[chest pa]
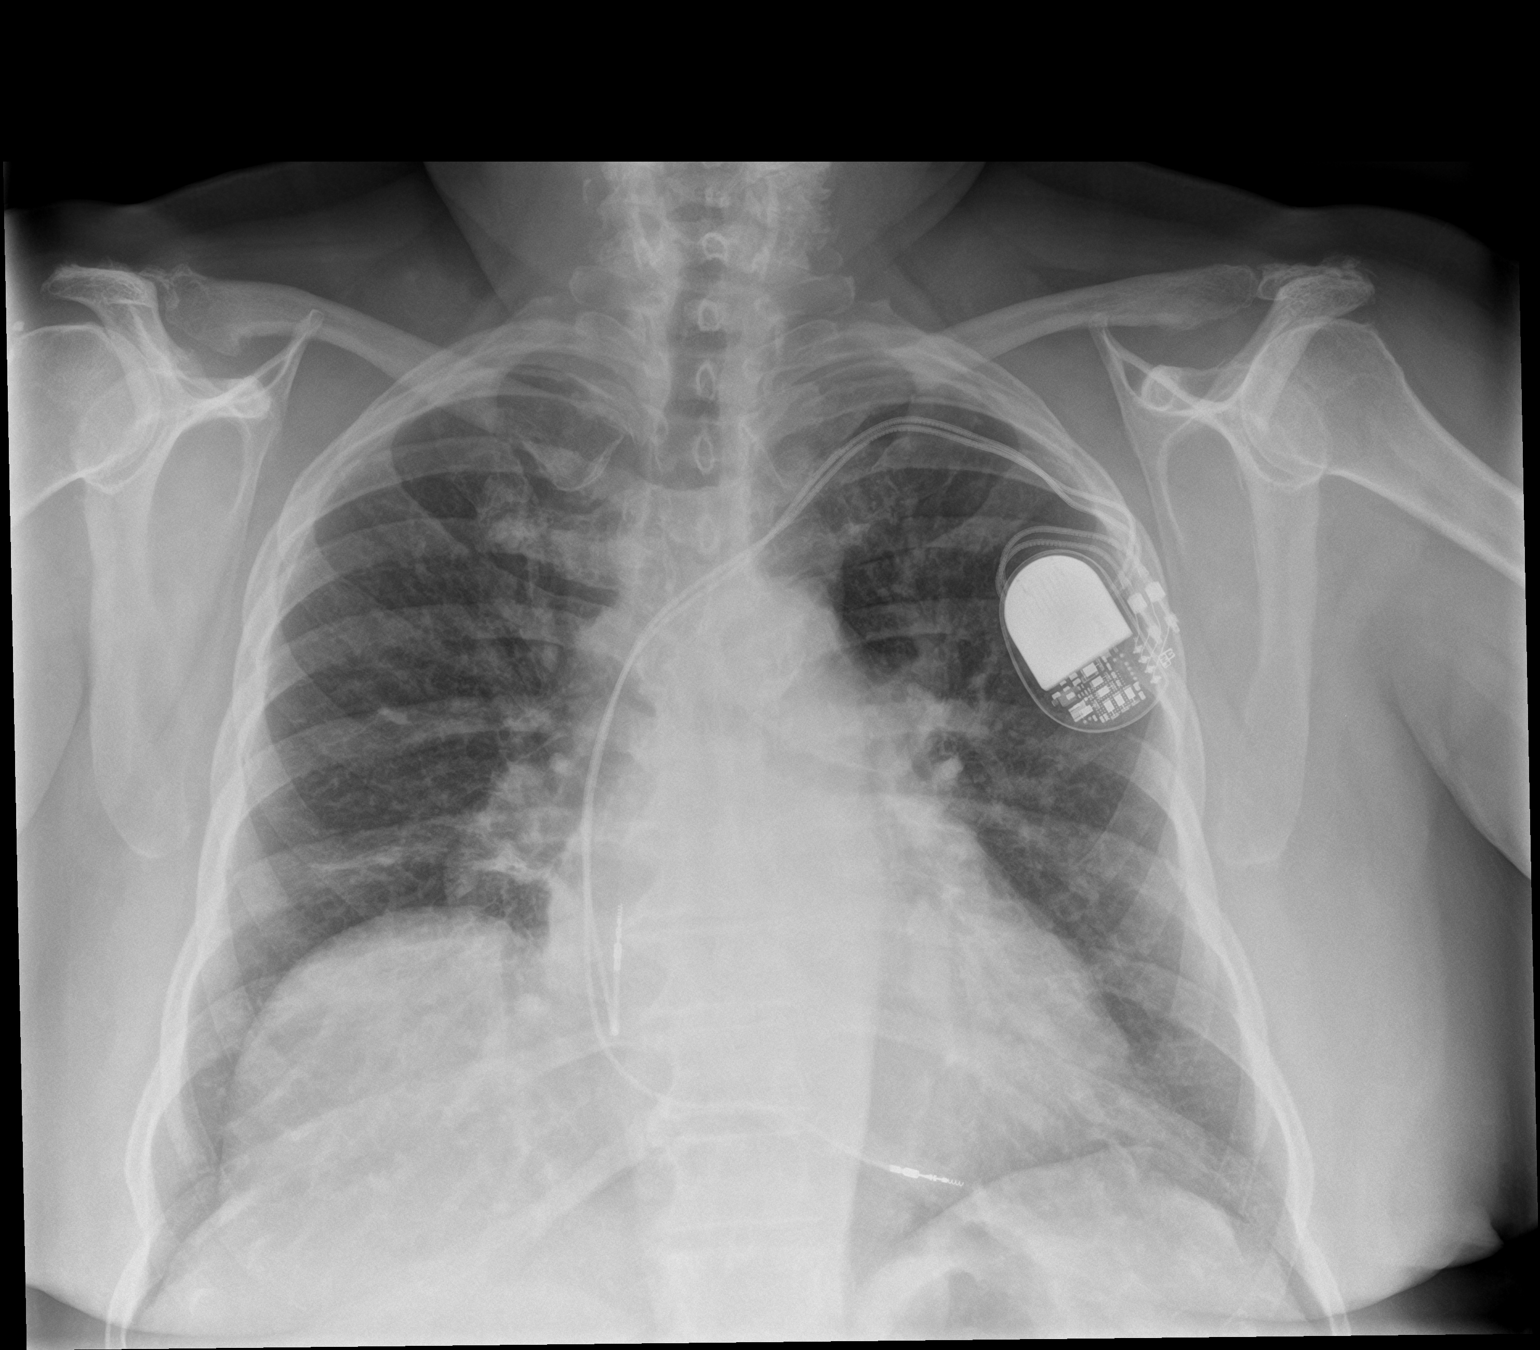

[chest lat]
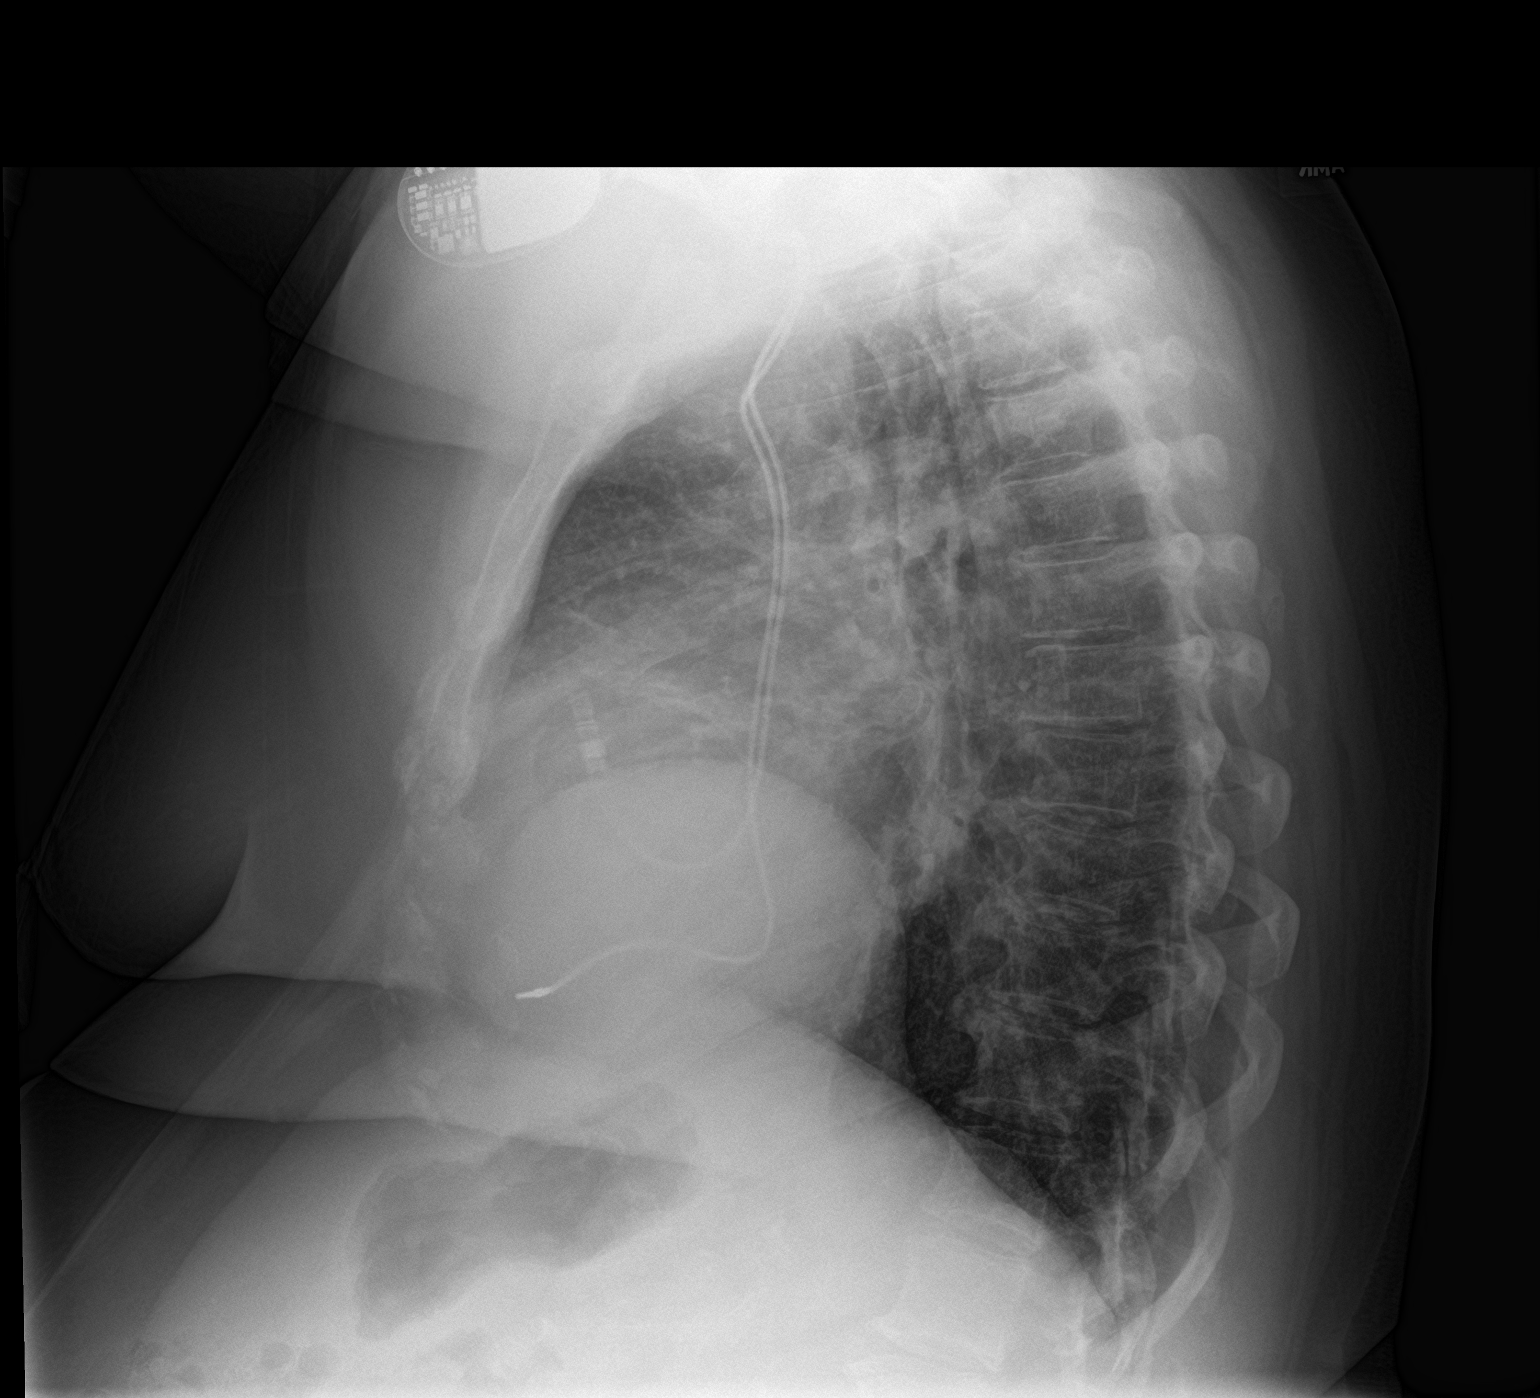

[2 of 2 positions shown; findings below may reference images not displayed]

FINDINGS: Stable cardiomediastinal contours with mild cardiomegaly. Chest wall
dual lead pacemaker is present. Chronic eventration of the right
hemidiaphragm. No new consolidation or edema. Chronic mild
interstitial prominence. No pleural effusion or pneumothorax. The
visualized skeletal structures are unremarkable.
IMPRESSION: No acute process in the chest.

## 2022-05-13 DIAGNOSIS — E1122 Type 2 diabetes mellitus with diabetic chronic kidney disease: Secondary | ICD-10-CM | POA: Diagnosis not present

## 2022-05-13 DIAGNOSIS — N183 Chronic kidney disease, stage 3 unspecified: Secondary | ICD-10-CM | POA: Diagnosis not present

## 2022-05-13 DIAGNOSIS — I129 Hypertensive chronic kidney disease with stage 1 through stage 4 chronic kidney disease, or unspecified chronic kidney disease: Secondary | ICD-10-CM | POA: Diagnosis not present

## 2022-05-19 ENCOUNTER — Other Ambulatory Visit: Payer: Self-pay | Admitting: Cardiology

## 2022-05-19 NOTE — Telephone Encounter (Signed)
Can you refill

## 2022-06-02 DIAGNOSIS — R069 Unspecified abnormalities of breathing: Secondary | ICD-10-CM | POA: Diagnosis not present

## 2022-06-02 DIAGNOSIS — I5033 Acute on chronic diastolic (congestive) heart failure: Secondary | ICD-10-CM | POA: Diagnosis not present

## 2022-06-03 ENCOUNTER — Encounter: Payer: Self-pay | Admitting: Internal Medicine

## 2022-06-03 ENCOUNTER — Ambulatory Visit: Payer: Medicare HMO | Admitting: Internal Medicine

## 2022-06-03 VITALS — BP 123/64 | HR 66 | Temp 98.1°F | Ht 62.0 in | Wt 192.2 lb

## 2022-06-03 DIAGNOSIS — R195 Other fecal abnormalities: Secondary | ICD-10-CM | POA: Diagnosis not present

## 2022-06-03 NOTE — Patient Instructions (Signed)
It was good to see you again today!  Imodium seems to be working well for you.  It is quite all right to take up to 4 every day as needed for loose stools  Since you are doing very well at this time, no scheduled follow-up with me.  Of course, should you develop any future issues, please do not hesitate to call us.

## 2022-06-03 NOTE — Progress Notes (Unsigned)
Primary Care Physician:  Merri Brunette, MD Primary Gastroenterologist:  Dr. Jena Gauss  Pre-Procedure History & Physical: HPI:  [name] is a 77 y.o. female here for follow-up of fecal incontinence/diarrhea likely multifactorial in etiology.  Patient states she can never find a window with which to take cholestyramine.  She is gone with Imodium titrated the dose up to four 2 mg tablets daily and this has been very successful as she reports.  No more incontinence.  Stools have firmed up.  1-2 bowel movements daily she is able to go about her business.  However, she states she is always ready for expected incontinence.  She is very pleased with her progress.  Talked about the idea 1 more colonoscopy in 2 years if overall health permitted given history of multiple colonic adenomas removed previously.  Will have another discussion in 2 years.  Past Medical History:  Diagnosis Date   Arthritis    CAD (coronary artery disease)    Diabetes mellitus without complication (HCC)    Diastolic heart failure (HCC)    Dyspnea    Encounter for care of pacemaker 09/03/2018   Family history of kidney cancer    Family history of throat cancer    Family history of uterine cancer    History of chickenpox    History of radiation therapy    Pelvis 10/23/20-12/14/20- Dr. Antony Blackbird   Hx of psoriatic arthritis    Hyperlipemia    Hypertension    Hypertension 05/03/2018   Hypothyroidism    Mitral valve disorders(424.0)    Myocardial infarction (HCC)    Obstructive sleep apnea    OSA (obstructive sleep apnea)    using BiPAP   Pacemaker    Paroxysmal atrial fibrillation (HCC) 10/11/2016   Peripheral neuropathy    Renal disorder Kidney disease stage2   Sick sinus syndrome [facility])    S/p dual-chamber pacemaker   Thyroid disease hypothyroid   Urine incontinence    Vitamin D deficiency    Vulvar cancer (HCC) 2022   s/p surgery and radiation therapy October-December 2022    Past Surgical History:   Procedure Laterality Date   BACK SURGERY     BACK SURGERY     BIOPSY  11/20/2021   Procedure: BIOPSY;  Surgeon: Corbin Ade, MD;  [facility];  Service: Endoscopy;;   CARDIAC CATHETERIZATION N/A 04/29/2015   Procedure: Temporary Pacemaker;  Surgeon: Yates Decamp, MD;  [facility]: [facility];  Service: Cardiovascular;  Laterality: N/A;   CARDIOVERSION N/A 09/07/2018   Procedure: CARDIOVERSION;  Surgeon: Yates Decamp, MD;  Location: [facility] ENDOSCOPY;  Service: Cardiovascular;  Laterality: N/A;   CARDIOVERSION     COLONOSCOPY  08/16/2008   Dr. Jena Gauss; pancolonic diverticulosis, otherwise no abnormalities.  Consider repeat in 10 years.   COLONOSCOPY WITH PROPOFOL N/A 11/20/2021   Procedure: COLONOSCOPY WITH PROPOFOL;  Surgeon: Corbin Ade, MD;  [facility];  Service: Endoscopy;  Laterality: N/A;  8:15am, asa 3   CORONARY PRESSURE/FFR STUDY N/A 08/31/2017   Procedure: INTRAVASCULAR PRESSURE WIRE/FFR STUDY;  Surgeon: Elder Negus, MD;  Location: [facility];  Service: Cardiovascular;  Laterality: N/A;   EP IMPLANTABLE DEVICE N/A 04/30/2015   Procedure: Pacemaker Implant;  Surgeon: Marinus Maw, MD;  Location: [facility] INVASIVE CV LAB;  Service: Cardiovascular;  Laterality: N/A;   LEFT AND RIGHT HEART CATHETERIZATION WITH CORONARY ANGIOGRAM N/A 09/30/2011   Procedure: LEFT AND RIGHT HEART CATHETERIZATION WITH CORONARY ANGIOGRAM;  Surgeon: Pamella Pert, MD;  Location: [facility] CATH LAB;  Service: Cardiovascular;  Laterality: N/A;   POLYPECTOMY  11/20/2021   Procedure: POLYPECTOMY;  Surgeon: Corbin Ade, MD;  [facility];  Service: Endoscopy;;   RIGHT/LEFT HEART CATH AND CORONARY ANGIOGRAPHY N/A 08/31/2017   Procedure: RIGHT/LEFT HEART CATH AND CORONARY ANGIOGRAPHY;  Surgeon: Elder Negus, MD;  [facility]: [facility];  Service: Cardiovascular;  Laterality: N/A;   TOOTH EXTRACTION  10/07/2018   TUBAL LIGATION     VULVA Ples Specter  BIOPSY N/A 07/26/2020   Procedure: VULVAR BIOPSY;  Surgeon: Carver Fila, MD;  Location: [facility];  Service: Gynecology;  Laterality: N/A;   YAG LASER APPLICATION Right 03/07/2014   Procedure: YAG LASER APPLICATION;  Surgeon: Loraine Leriche T. Nile Riggs, MD;  Location: [facility];  Service: Ophthalmology;  Laterality: Right;  pt knows to arrive at 11:15   YAG LASER APPLICATION Left 03/21/2014   Procedure: YAG LASER APPLICATION;  Surgeon: Jethro Bolus, MD;  Location: [facility];  Service: Ophthalmology;  Laterality: Left;    Prior to Admission medications   Medication Sig Start Date End Date Taking? Authorizing Provider  amiodarone (PACERONE) 200 MG tablet Take 1/2 (one-half) tablet by mouth once daily 05/20/22  Yes Yates Decamp, MD  apixaban (ELIQUIS) 5 MG TABS tablet Take 1 tablet (5 mg total) by mouth 2 (two) times daily. 04/03/22  Yes Yates Decamp, MD  Blood Glucose Monitoring Suppl (ACCU-CHEK AVIVA PLUS) w/Device KIT    Yes [provider]  cholecalciferol (VITAMIN D3) 25 MCG (1000 UNIT) tablet Take 1,000 Units by mouth in the morning.   Yes [provider]  clobetasol cream (TEMOVATE) 0.05 % Apply 1 Application topically 2 (two) times daily.   Yes [provider]  dapagliflozin propanediol (FARXIGA) 10 MG TABS tablet Take 1 tablet (10 mg total) by mouth daily before breakfast. 02/18/22  Yes Yates Decamp, MD  diphenhydramine-acetaminophen (TYLENOL PM) 25-500 MG TABS tablet Take 1 tablet by mouth at bedtime as needed (sleep).   Yes [provider]  fenofibrate 160 MG tablet Take 160 mg by mouth daily with supper.    Yes [provider]  insulin aspart protamine- aspart (NOVOLOG MIX 70/30) (70-30) 100 UNIT/ML injection Inject 30-36 Units into the skin See admin instructions. Inject 36 units subcutaneously with breakfast & inject 30 units subcutaneously with supper (may adjust based on blood sugar readings)   Yes [provider]  isosorbide mononitrate (IMDUR) 60 MG 24  hr tablet Take 1 tablet by mouth once daily 04/01/22  Yes Yates Decamp, MD  levothyroxine (SYNTHROID) 137 MCG tablet Take 137 mcg by mouth daily before breakfast.   Yes [provider]  Loperamide HCl (IMODIUM PO) Take 1 capsule by mouth 2 (two) times daily.   Yes [provider]  metoprolol tartrate (LOPRESSOR) 50 MG tablet Take 1 tablet by mouth twice daily 02/25/22  Yes Yates Decamp, MD  nitroGLYCERIN (NITROSTAT) 0.4 MG SL tablet Place 1 tablet (0.4 mg total) under the tongue every 5 (five) minutes as needed for chest pain. 10/01/15  Yes Orpah Cobb, MD  olmesartan-hydrochlorothiazide (BENICAR HCT) 40-25 MG tablet Take 1 tablet by mouth once daily 04/07/22  Yes Yates Decamp, MD  spironolactone (ALDACTONE) 25 MG tablet Take 1 tablet by mouth once daily Patient taking differently: Take 25 mg by mouth every evening. 05/28/21  Yes Yates Decamp, MD  torsemide (DEMADEX) 10 MG tablet Take 1 tablet (10 mg total) by mouth as needed. 07/09/21  Yes Cantwell, Celeste C, PA-C    Allergies as of 06/03/2022 - Review Complete 06/03/2022  Allergen Reaction Noted   Hydrocodone Other (See Comments) May 03, 2015   Invokana [canagliflozin] Palpitations and Other (See Comments) 05-03-2015   Oxycodone Other (See Comments) 05/03/2015    Family History  Problem Relation Age of Onset   Arthritis Mother    Diabetes Mother    Heart disease Mother    Hyperlipidemia Mother    Hypertension Mother    Arthritis Father    Asthma Father    Heart attack Father    Hyperlipidemia Father    Hypertension Father    Stroke Father    Arthritis Sister    Diabetes Sister    Hypertension Sister    Arthritis Sister    Diabetes Sister    Hyperlipidemia Sister    Hypertension Sister    Stroke Sister    Ovarian cancer Sister 13   Pancreatic cancer Sister 40   Uterine cancer Sister 73   Hyperlipidemia Sister    Hypertension Sister    Arthritis Brother    Heart attack Brother    Heart disease Brother     Hyperlipidemia Brother    Hypertension Brother    Alcohol abuse Brother    Arthritis Brother    Diabetes Brother    Early death Brother    Heart disease Brother    Hyperlipidemia Brother    Hypertension Brother    Cancer Maternal Aunt 27       unknown type   Throat cancer Maternal Grandfather        hx smoking/drinking   Hypertension Daughter    Cancer Daughter        "female cancer cells"   Kidney cancer Nephew        dx 5s   Colon cancer Neg Hx    Breast cancer Neg Hx    Prostate cancer Neg Hx     Social History   Socioeconomic History   Marital status: Widowed    Spouse name: Not on file   Number of children: 3   Years of education: Not on file   Highest education level: Not on file  Occupational History   Not on file  Tobacco Use   Smoking status: Never   Smokeless tobacco: Never  Vaping Use   Vaping Use: Never used  Substance and Sexual Activity   Alcohol use: No    Alcohol/week: 0.0 standard drinks of alcohol   Drug use: No   Sexual activity: Not Currently    Birth control/protection: Post-menopausal  Other Topics Concern   Not on file  Social History Narrative   Husband recently passed away on 2020-05-02.   Social Determinants of Health   Financial Resource Strain: Not on file  Food Insecurity: Not on file  Transportation Needs: Not on file  Physical Activity: Not on file  Stress: Not on file  Social Connections: Not on file  Intimate Partner Violence: Not on file    Review of Systems: See HPI, otherwise negative ROS  Physical Exam: BP 123/64 (BP Location: Right Arm, Patient Position: Sitting, Cuff Size: Normal)   Pulse 66   Temp 98.1 F (36.7 C) (Oral)   Ht 5\' 2"  (1.575 m)   Wt 192 lb 3.2 oz (87.2 kg)   SpO2 94%   BMI 35.15 kg/m  General:   Alert,  Well-developed, well-nourished, pleasant and cooperative in NAD Abdomen: Non-distended, normal bowel sounds.  Soft and nontender without appreciable mass  or hepatosplenomegaly.     Impression/Plan: 77 year old lady with multiple comorbidities including a prior history of pelvic radiation history of incontinence diarrhea-Felt to be multifactorial in etiology.  Imodium seems to be working well.  It is quite all right to take up to 4 every day as needed for loose stools  Of course, if patient develops any future issues, she is to call me.                       Notice: This dictation was prepared with Dragon dictation along with smaller phrase technology. Any transcriptional errors that result from this process are unintentional and may not be corrected upon review.

## 2022-06-04 ENCOUNTER — Other Ambulatory Visit: Payer: Self-pay | Admitting: Cardiology

## 2022-06-05 DIAGNOSIS — M1990 Unspecified osteoarthritis, unspecified site: Secondary | ICD-10-CM | POA: Diagnosis not present

## 2022-06-05 DIAGNOSIS — E1142 Type 2 diabetes mellitus with diabetic polyneuropathy: Secondary | ICD-10-CM | POA: Diagnosis not present

## 2022-06-05 DIAGNOSIS — I4891 Unspecified atrial fibrillation: Secondary | ICD-10-CM | POA: Diagnosis not present

## 2022-06-05 DIAGNOSIS — E669 Obesity, unspecified: Secondary | ICD-10-CM | POA: Diagnosis not present

## 2022-06-05 DIAGNOSIS — I509 Heart failure, unspecified: Secondary | ICD-10-CM | POA: Diagnosis not present

## 2022-06-05 DIAGNOSIS — E039 Hypothyroidism, unspecified: Secondary | ICD-10-CM | POA: Diagnosis not present

## 2022-06-05 DIAGNOSIS — Z008 Encounter for other general examination: Secondary | ICD-10-CM | POA: Diagnosis not present

## 2022-06-05 DIAGNOSIS — D6869 Other thrombophilia: Secondary | ICD-10-CM | POA: Diagnosis not present

## 2022-06-05 DIAGNOSIS — I25119 Atherosclerotic heart disease of native coronary artery with unspecified angina pectoris: Secondary | ICD-10-CM | POA: Diagnosis not present

## 2022-06-05 DIAGNOSIS — E785 Hyperlipidemia, unspecified: Secondary | ICD-10-CM | POA: Diagnosis not present

## 2022-06-05 DIAGNOSIS — R32 Unspecified urinary incontinence: Secondary | ICD-10-CM | POA: Diagnosis not present

## 2022-06-05 DIAGNOSIS — K219 Gastro-esophageal reflux disease without esophagitis: Secondary | ICD-10-CM | POA: Diagnosis not present

## 2022-06-05 DIAGNOSIS — Z794 Long term (current) use of insulin: Secondary | ICD-10-CM | POA: Diagnosis not present

## 2022-06-08 ENCOUNTER — Other Ambulatory Visit: Payer: Self-pay | Admitting: Cardiology

## 2022-06-11 ENCOUNTER — Telehealth: Payer: Self-pay

## 2022-06-11 NOTE — Telephone Encounter (Signed)
Pt called and LVM regarding vulva sore.  She was seen on 4/3 with Dr. Tamela Oddi.  Pt stated that the area is not any better, she's miserable and its almost unbearable.  Pt stated that she is using the clobetasol cream 3x daily and Vaseline in between. Pt stated that she would like to be seen.  Next available appt with Dr. Tamela Oddi is June 5th @ 130.  Called pt back and LVM for return call.

## 2022-06-11 NOTE — Telephone Encounter (Signed)
Received call from pt.  Pt stated that the vulvar area is still very painful, esp when she sits.  "I have to get in the right position for it to not hurt." Pt states that it feels like a sore on the outside, not the inside, but she can't see it.  Pt denies itching and burning.   Scheduled pt with Dr. Tamela Oddi on 6/5 @ 130 pm. Informed pt to call with any new symptoms or concerns.

## 2022-06-17 NOTE — Progress Notes (Signed)
Follow Up Note: Gyn-Onc  [name] 77 y.o. female  CC: New vulva sore   HPI: The oncology history was reviewed.  Interval History: Prior dx of VIN 1, vulva LS.  Comorbid issues include DM.  Per the pt glycemic control is adequate  The vulva soreness started at the last visit.  Continues to be very anxious re: possible cancer recurrence.  Wet prep from the last visit was negative.  Review of Systems  Review of Systems  Constitutional:  Negative for malaise/fatigue and weight loss.  Respiratory:  Negative for shortness of breath and wheezing.   Cardiovascular:  Negative for chest pain and leg swelling.  Gastrointestinal:  Negative for abdominal pain, blood in stool, constipation, nausea and vomiting.  Genitourinary:  See above Musculoskeletal:  Negative for joint pain and myalgias.  Neurological:  Negative for weakness.  Psychiatric/Behavioral:  Negative for depression. The patient does not have insomnia.    Current medications, allergy, social history, past surgical history, past medical history, family history were all reviewed.    Vitals:  BP (!) 117/50 (BP Location: Left Arm, Patient Position: Sitting)   Pulse 72   Temp 98.3 F (36.8 C)   Resp 18   Ht 5' 2.99" (1.6 m)   Wt 191 lb 3.2 oz (86.7 kg)   SpO2 98%   BMI 33.88 kg/m     Physical Exam:  Physical Exam Exam conducted with a chaperone present.  Constitutional:      General: She is not in acute distress. Cardiovascular:     Rate and Rhythm: Normal rate and regular rhythm.  Pulmonary:     Effort: Pulmonary effort is normal.     Breath sounds: Normal breath sounds. No wheezing or rhonchi.  Abdominal:     Palpations: Abdomen is soft.     Tenderness: There is no abdominal tenderness. There is no right CVA tenderness or left CVA tenderness.     Hernia: No hernia is present.  Genitourinary:    General: Midline mons large ulcerative lesion--1 cm.  Multiple shallow ulcers, white, waxy skin, telangietasias.   Clitoris buried     Urethra: No urethral lesion.     Vagina: No lesions. No bleeding Musculoskeletal:     Cervical back: Neck supple.     Right lower leg: No edema.     Left lower leg: No edema.  Lymphadenopathy:     Upper Body:     Right upper body: No supraclavicular adenopathy.     Left upper body: No supraclavicular adenopathy.     Lower Body: No right inguinal adenopathy. ; left groin skin w/a white plaque, ecchymotic areas Skin:    Findings: No rash.  Neurological:     Mental Status: She is oriented to person, place, and time.       Punch Biopsy Procedure Note  Pre-operative Diagnosis: Suspicious lesion  Post-operative Diagnosis: same  Locations: vulva, mons   Indications: see above  Anesthesia: Lidocaine 1% without epinephrine   Procedure Details  History of allergy to iodine: no Patient informed of the risks (including bleeding and infection) and benefits of the  procedure and Verbal informed consent obtained.  The lesion and surrounding area was given a sterile prep using betadyne and draped in the usual sterile fashion. The skin was then stretched perpendicular to the skin tension lines and the lesion removed using the 3 mm punch. The resulting ellipse was then closed. The wound was closed with 4-0 Vicryl using interrupted mattress stitches. The specimen was  sent for pathologic examination. The patient tolerated the procedure well.    Condition: Stable  Complications: none.   Assessment/Plan:  H/O vulva cancer now w/a lesion in the setting of radiation changes, vulva LS, IAD w/small pressure ulcers  >instructed to keep the wound dry and covered for 24-48h and clean thereafter.  Printed instructions provided >warning signs of infection/hematoma were reviewed.   >Recommended that the patient use OTC analgesics as needed for pain.  >optimize hygiene practices, moisturizers, skin protectant >continue topical steroid >short course of topical estrogen   >review the histology from the bx >Return for suture removal in 2 weeks.     I personally spent 25 minutes face-to-face and non-face-to-face in the care of this patient, which includes all pre, intra, and post visit time on the date of service.    Antionette Char, MD

## 2022-06-18 ENCOUNTER — Inpatient Hospital Stay: Payer: Medicare HMO | Attending: Gynecologic Oncology | Admitting: Obstetrics & Gynecology

## 2022-06-18 ENCOUNTER — Encounter: Payer: Self-pay | Admitting: Obstetrics & Gynecology

## 2022-06-18 VITALS — BP 117/50 | HR 72 | Temp 98.3°F | Resp 18 | Ht 62.99 in | Wt 191.2 lb

## 2022-06-18 DIAGNOSIS — Z8544 Personal history of malignant neoplasm of other female genital organs: Secondary | ICD-10-CM | POA: Insufficient documentation

## 2022-06-18 DIAGNOSIS — E119 Type 2 diabetes mellitus without complications: Secondary | ICD-10-CM | POA: Insufficient documentation

## 2022-06-18 DIAGNOSIS — N762 Acute vulvitis: Secondary | ICD-10-CM | POA: Diagnosis not present

## 2022-06-18 DIAGNOSIS — N904 Leukoplakia of vulva: Secondary | ICD-10-CM | POA: Insufficient documentation

## 2022-06-18 DIAGNOSIS — Z923 Personal history of irradiation: Secondary | ICD-10-CM | POA: Insufficient documentation

## 2022-06-18 DIAGNOSIS — N9089 Other specified noninflammatory disorders of vulva and perineum: Secondary | ICD-10-CM | POA: Insufficient documentation

## 2022-06-18 DIAGNOSIS — L900 Lichen sclerosus et atrophicus: Secondary | ICD-10-CM

## 2022-06-18 DIAGNOSIS — C519 Malignant neoplasm of vulva, unspecified: Secondary | ICD-10-CM

## 2022-06-18 DIAGNOSIS — N766 Ulceration of vulva: Secondary | ICD-10-CM | POA: Diagnosis not present

## 2022-06-18 MED ORDER — ESTRADIOL 0.1 MG/GM VA CREA: 1.0000 | TOPICAL_CREAM | Freq: Every day | VAGINAL | 12 refills | Status: AC

## 2022-06-18 NOTE — Patient Instructions (Signed)
Return in 2 weeks

## 2022-06-27 DIAGNOSIS — E559 Vitamin D deficiency, unspecified: Secondary | ICD-10-CM | POA: Diagnosis not present

## 2022-06-27 DIAGNOSIS — E1122 Type 2 diabetes mellitus with diabetic chronic kidney disease: Secondary | ICD-10-CM | POA: Diagnosis not present

## 2022-06-30 LAB — SURGICAL PATHOLOGY

## 2022-07-02 ENCOUNTER — Telehealth: Payer: Self-pay | Admitting: *Deleted

## 2022-07-02 DIAGNOSIS — E785 Hyperlipidemia, unspecified: Secondary | ICD-10-CM | POA: Diagnosis not present

## 2022-07-02 DIAGNOSIS — E1122 Type 2 diabetes mellitus with diabetic chronic kidney disease: Secondary | ICD-10-CM | POA: Diagnosis not present

## 2022-07-02 DIAGNOSIS — Z0000 Encounter for general adult medical examination without abnormal findings: Secondary | ICD-10-CM | POA: Diagnosis not present

## 2022-07-02 DIAGNOSIS — Z8544 Personal history of malignant neoplasm of other female genital organs: Secondary | ICD-10-CM | POA: Diagnosis not present

## 2022-07-02 DIAGNOSIS — I4891 Unspecified atrial fibrillation: Secondary | ICD-10-CM | POA: Diagnosis not present

## 2022-07-02 DIAGNOSIS — Z23 Encounter for immunization: Secondary | ICD-10-CM | POA: Diagnosis not present

## 2022-07-02 DIAGNOSIS — E559 Vitamin D deficiency, unspecified: Secondary | ICD-10-CM | POA: Diagnosis not present

## 2022-07-02 DIAGNOSIS — E039 Hypothyroidism, unspecified: Secondary | ICD-10-CM | POA: Diagnosis not present

## 2022-07-02 DIAGNOSIS — I129 Hypertensive chronic kidney disease with stage 1 through stage 4 chronic kidney disease, or unspecified chronic kidney disease: Secondary | ICD-10-CM | POA: Diagnosis not present

## 2022-07-02 DIAGNOSIS — Z923 Personal history of irradiation: Secondary | ICD-10-CM | POA: Diagnosis not present

## 2022-07-02 DIAGNOSIS — I5032 Chronic diastolic (congestive) heart failure: Secondary | ICD-10-CM | POA: Diagnosis not present

## 2022-07-02 DIAGNOSIS — I25119 Atherosclerotic heart disease of native coronary artery with unspecified angina pectoris: Secondary | ICD-10-CM | POA: Diagnosis not present

## 2022-07-02 DIAGNOSIS — K529 Noninfective gastroenteritis and colitis, unspecified: Secondary | ICD-10-CM | POA: Diagnosis not present

## 2022-07-02 NOTE — Telephone Encounter (Signed)
Spoke with [name] and relayed message from Warner Mccreedy, NP of her recent biopsy results showed inflammation, no precancer or cancer seen. Patient Thanked the office for the good news and has no further questions or concerns at this time.

## 2022-07-02 NOTE — Telephone Encounter (Signed)
-----   Message from Doylene Bode, NP sent at 07/02/2022 11:16 AM EDT ----- Please let her know her recent biopsy of the vulva showed inflammation. No precancer or cancer seen. Good news! ----- Message ----- From: Interface, Lab In Three Zero One Sent: 06/30/2022  10:37 AM EDT To: Antionette Char, MD

## 2022-07-03 DIAGNOSIS — R069 Unspecified abnormalities of breathing: Secondary | ICD-10-CM | POA: Diagnosis not present

## 2022-07-03 DIAGNOSIS — I5033 Acute on chronic diastolic (congestive) heart failure: Secondary | ICD-10-CM | POA: Diagnosis not present

## 2022-07-07 ENCOUNTER — Other Ambulatory Visit: Payer: Self-pay | Admitting: Cardiology

## 2022-07-09 ENCOUNTER — Other Ambulatory Visit: Payer: Self-pay

## 2022-07-09 MED ORDER — APIXABAN 5 MG PO TABS: 5.0000 mg | ORAL_TABLET | Freq: Two times a day (BID) | ORAL | 3 refills | Status: DC

## 2022-07-10 ENCOUNTER — Inpatient Hospital Stay (HOSPITAL_BASED_OUTPATIENT_CLINIC_OR_DEPARTMENT_OTHER): Payer: Medicare HMO | Admitting: Gynecologic Oncology

## 2022-07-10 ENCOUNTER — Encounter: Payer: Self-pay | Admitting: Gynecologic Oncology

## 2022-07-10 VITALS — BP 123/40 | HR 83 | Temp 98.5°F | Ht 62.99 in | Wt 191.4 lb

## 2022-07-10 DIAGNOSIS — Z923 Personal history of irradiation: Secondary | ICD-10-CM | POA: Diagnosis not present

## 2022-07-10 DIAGNOSIS — Z8544 Personal history of malignant neoplasm of other female genital organs: Secondary | ICD-10-CM

## 2022-07-10 DIAGNOSIS — N9089 Other specified noninflammatory disorders of vulva and perineum: Secondary | ICD-10-CM | POA: Diagnosis not present

## 2022-07-10 DIAGNOSIS — C519 Malignant neoplasm of vulva, unspecified: Secondary | ICD-10-CM

## 2022-07-10 DIAGNOSIS — E119 Type 2 diabetes mellitus without complications: Secondary | ICD-10-CM | POA: Diagnosis not present

## 2022-07-10 DIAGNOSIS — N904 Leukoplakia of vulva: Secondary | ICD-10-CM

## 2022-07-10 MED ORDER — LIDOCAINE 5 % EX OINT: 1.0000 | TOPICAL_OINTMENT | Freq: Three times a day (TID) | CUTANEOUS | 0 refills | Status: DC | PRN

## 2022-07-10 NOTE — Patient Instructions (Addendum)
It was good to see you today.  I do not see or feel any evidence of cancer recurrence on your exam.  Please stop using the estrogen cream.  Please start using the clobetasol or steroid cream 2 times a week on the areas that are causing you symptoms.  I have sent in some lidocaine jelly for you to use for discomfort.  You can also continue using Vaseline.  I will see you for follow-up in 3 months.  As always, if you develop any new and concerning symptoms before your next visit, please call to see me sooner.

## 2022-07-10 NOTE — Progress Notes (Signed)
Gynecologic Oncology Return Clinic Visit  07/10/22  Reason for Visit: Follow-up in the setting of vulvar cancer    Treatment History: Oncology History  Vulvar cancer (HCC)  07/20/2020 Initial Diagnosis   Vulvar cancer (HCC)   07/26/2020 Surgery   EUA, vulvar biopsies  Findings: Pictures taken in media.  On exam, there is about a 4 x 3-1/2 cm area concerning for involvement by carcinoma that spans between the clitoris (replacing the bottom aspect of the clitoris) to just superior to the urethra.  This is a butterfly lesion that extends along the inner labia bilaterally.  Previous biopsy site is healing well with suture still intact.  There is a firmer and raised area that is approximately 1 cm around the area of the biopsy.  Biopsies taken circumferentially around the lesion noted as well as from the central aspect to help delineate what is cancer and what may be chronic dermatoses.  Given appearance today with patient more comfortable, I am suspicious that the entire lesion is carcinoma.   07/26/2020 Pathology Results   A. VULVA, 6 OCLOCK, INFERIOR ASPECT, BIOPSY:  - Superficial fragments of squamous cell carcinoma.   B. VULVA, 11 OCLOCK, BIOPSY:  - Invasive squamous cell carcinoma.  - No lymphovascular invasion.   C. VULVA, 2 OCLOCK, BIOPSY:  - Invasive squamous cell carcinoma.  - No lymphovascular invasion.   D. VULVA, CENTRAL, BIOPSY:  - Superficial fragment of squamous cell carcinoma.    08/06/2020 Imaging   PET: no evidence of metastatic disease   08/07/2020 Clinical Stage   Stage IB   08/22/2020 Surgery   Partial radical anterior vulvectomy with right inguinal SLN biopsy.  Findings: 4x4cm butterfly lesion on the anterior vulva, spanning from just superior to the clitoris to just superior to the urethral meatus. Lesion crossed the midline, more significant lesion on the right. LSG prior to surgery showed bilateral mapping. No hot or blue SLN identified on the left, mildly blue  and hot node identified as the SLN on the right. Decision made to abort left-sided LND given hypertension and difficulty ventilating. Additionally, given intra-op cardiac and respiratory status, decision made to proceed with a less radical resection (very close if not positive peri-urethral margin visibly.   08/22/2020 Pathology Results   Stage IB, lymph node assessment only on the right given comorbidities and cardiac issues during surgery  Tumor Focality:    Unifocal     Tumor Site:    Right vulva     Tumor Site:    Left vulva     Tumor Size:    Greatest Dimension (Centimeters): 4 cm     Histologic Type:    Squamous cell carcinoma, HPV-independent     Histologic Grade:    G3, poorly differentiated     Depth of Tumor Invasion:    6 mm     Tumor Border:    Infiltrating     Other Tissue / Organ Involvement:    Not applicable     Lymphovascular Invasion:    Not identified   MARGINS     Margin Status for Invasive Carcinoma:    All margins negative for invasive carcinoma       Closest Margin(s) to Invasive Carcinoma:    Peripheral: 12:00       Distance from Invasive Carcinoma to Closest Margin:    1 mm     Margin Status for HSIL (VIN2-3) or dVIN:    All margins negative for high-grade squamous intraepithelial lesion (HSIL) and /  or differentiated vulvar intraepithelial neoplasia (dVIN)   REGIONAL LYMPH NODES     Regional Lymph Node Status:           :    All regional lymph nodes negative for tumor cells       Total Number of Lymph Nodes Examined:    1         Nodal Site(s) Examined:    Right inguinal       Number of Sentinel Nodes Examined:    1    09/06/2020 Genetic Testing   Negative genetic testing:  No pathogenic variants detected on the [facility] Multi-Cancer + RNA panel. A variant of uncertain significance (VUS) was detected in the POLD1 gene called c.427G>A (p.Gly143Ser). The report date is 09/06/2020.  The Multi-Cancer + RNA Panel offered by [facility] includes sequencing and/or  deletion/duplication analysis of the following 84 genes:  AIP*, ALK, APC*, ATM*, AXIN2*, BAP1*, BARD1*, BLM*, BMPR1A*, BRCA1*, BRCA2*, BRIP1*, CASR, CDC73*, CDH1*, CDK4, CDKN1B*, CDKN1C*, CDKN2A, CEBPA, CHEK2*, CTNNA1*, DICER1*, DIS3L2*, EGFR, EPCAM, FH*, FLCN*, GATA2*, GPC3, GREM1, HOXB13, HRAS, KIT, MAX*, MEN1*, MET, MITF, MLH1*, MSH2*, MSH3*, MSH6*, MUTYH*, NBN*, NF1*, NF2*, NTHL1*, PALB2*, PDGFRA, PHOX2B, PMS2*, POLD1*, POLE*, POT1*, PRKAR1A*, PTCH1*, PTEN*, RAD50*, RAD51C*, RAD51D*, RB1*, RECQL4, RET, RUNX1*, SDHA*, SDHAF2*, SDHB*, SDHC*, SDHD*, SMAD4*, SMARCA4*, SMARCB1*, SMARCE1*, STK11*, SUFU*, TERC, TERT, TMEM127*, Tp53*, TSC1*, TSC2*, VHL*, WRN*, and WT1.  RNA analysis is performed for * genes.    10/23/2020 - 12/14/2020 Radiation Therapy   10/23/2020 through 12/14/2020 Site Technique Total Dose (Gy) Dose per Fx (Gy) Completed Fx Beam Energies  Vulva: Pelvis IMRT 50.4/50.4 1.8 28/28 6X  Vulva: Pelvis_Bst IMRT 9/9 1.8 5/5 6X         Interval History: Continues to have some irritation at her vulva where her recent biopsy was performed.  Has been using estrogen on this area and by her rectum 3 times a day.  Denies any bleeding.  Reports significant improvement in her bowel function now that she is taking Imodium 4 times a day.  Denies urinary symptoms.  Past Medical/Surgical History: Past Medical History:  Diagnosis Date   Arthritis    CAD (coronary artery disease)    Diabetes mellitus without complication (HCC)    Diastolic heart failure (HCC)    Dyspnea    Encounter for care of pacemaker 09/03/2018   Family history of kidney cancer    Family history of throat cancer    Family history of uterine cancer    History of chickenpox    History of radiation therapy    Pelvis 10/23/20-12/14/20- Dr. Antony Blackbird   Hx of psoriatic arthritis    Hyperlipemia    Hypertension    Hypertension 05/03/2018   Hypothyroidism    Mitral valve disorders(424.0)    Myocardial infarction (HCC)     Obstructive sleep apnea    OSA (obstructive sleep apnea)    using BiPAP   Pacemaker    Paroxysmal atrial fibrillation (HCC) 10/11/2016   Peripheral neuropathy    Renal disorder Kidney disease stage2   Sick sinus syndrome [facility])    S/p dual-chamber pacemaker   Thyroid disease hypothyroid   Urine incontinence    Vitamin D deficiency    Vulvar cancer (HCC) 2022   s/p surgery and radiation therapy October-December 2022    Past Surgical History:  Procedure Laterality Date   BACK SURGERY     BACK SURGERY     BIOPSY  11/20/2021   Procedure: BIOPSY;  Surgeon: Eula Listen  M, MD;  Location: [facility] ENDO SUITE;  Service: Endoscopy;;   CARDIAC CATHETERIZATION N/A 04/29/2015   Procedure: Temporary Pacemaker;  Surgeon: Yates Decamp, MD;  Location: [facility] INVASIVE CV LAB;  Service: Cardiovascular;  Laterality: N/A;   CARDIOVERSION N/A 09/07/2018   Procedure: CARDIOVERSION;  Surgeon: Yates Decamp, MD;  Location: [facility] ENDOSCOPY;  Service: Cardiovascular;  Laterality: N/A;   CARDIOVERSION     COLONOSCOPY  08/16/2008   Dr. Jena Gauss; pancolonic diverticulosis, otherwise no abnormalities.  Consider repeat in 10 years.   COLONOSCOPY WITH PROPOFOL N/A 11/20/2021   Procedure: COLONOSCOPY WITH PROPOFOL;  Surgeon: Corbin Ade, MD;  Location: [facility] ENDO SUITE;  Service: Endoscopy;  Laterality: N/A;  8:15am, asa 3   CORONARY PRESSURE/FFR STUDY N/A 08/31/2017   Procedure: INTRAVASCULAR PRESSURE WIRE/FFR STUDY;  Surgeon: Elder Negus, MD;  Location: [facility] INVASIVE CV LAB;  Service: Cardiovascular;  Laterality: N/A;   EP IMPLANTABLE DEVICE N/A 04/30/2015   Procedure: Pacemaker Implant;  Surgeon: Marinus Maw, MD;  Location: [facility] INVASIVE CV LAB;  Service: Cardiovascular;  Laterality: N/A;   LEFT AND RIGHT HEART CATHETERIZATION WITH CORONARY ANGIOGRAM N/A 09/30/2011   Procedure: LEFT AND RIGHT HEART CATHETERIZATION WITH CORONARY ANGIOGRAM;  Surgeon: Pamella Pert, MD;  Location: [facility] CATH LAB;  Service: Cardiovascular;   Laterality: N/A;   POLYPECTOMY  11/20/2021   Procedure: POLYPECTOMY;  Surgeon: Corbin Ade, MD;  Location: [facility];  Service: Endoscopy;;   RIGHT/LEFT HEART CATH AND CORONARY ANGIOGRAPHY N/A 08/31/2017   Procedure: RIGHT/LEFT HEART CATH AND CORONARY ANGIOGRAPHY;  Surgeon: Elder Negus, MD;  Location: [facility] INVASIVE CV LAB;  Service: Cardiovascular;  Laterality: N/A;   TOOTH EXTRACTION  10/07/2018   TUBAL LIGATION     VULVA Ples Specter BIOPSY N/A 07/26/2020   Procedure: VULVAR BIOPSY;  Surgeon: Carver Fila, MD;  Location: [facility] ORS;  Service: Gynecology;  Laterality: N/A;   YAG LASER APPLICATION Right 03/07/2014   Procedure: YAG LASER APPLICATION;  Surgeon: Loraine Leriche T. Nile Riggs, MD;  Location: [facility] ORS;  Service: Ophthalmology;  Laterality: Right;  pt knows to arrive at 11:15   YAG LASER APPLICATION Left 03/21/2014   Procedure: YAG LASER APPLICATION;  Surgeon: Jethro Bolus, MD;  Location: [facility] ORS;  Service: Ophthalmology;  Laterality: Left;    Family History  Problem Relation Age of Onset   Arthritis Mother    Diabetes Mother    Heart disease Mother    Hyperlipidemia Mother    Hypertension Mother    Arthritis Father    Asthma Father    Heart attack Father    Hyperlipidemia Father    Hypertension Father    Stroke Father    Arthritis Sister    Diabetes Sister    Hypertension Sister    Arthritis Sister    Diabetes Sister    Hyperlipidemia Sister    Hypertension Sister    Stroke Sister    Ovarian cancer Sister 47   Pancreatic cancer Sister 14   Uterine cancer Sister 62   Hyperlipidemia Sister    Hypertension Sister    Arthritis Brother    Heart attack Brother    Heart disease Brother    Hyperlipidemia Brother    Hypertension Brother    Alcohol abuse Brother    Arthritis Brother    Diabetes Brother    Early death Brother    Heart disease Brother    Hyperlipidemia Brother    Hypertension Brother    Cancer Maternal Aunt 47  unknown type   Throat cancer  Maternal Grandfather        hx smoking/drinking   Hypertension Daughter    Cancer Daughter        "female cancer cells"   Kidney cancer Nephew        dx 22s   Colon cancer Neg Hx    Breast cancer Neg Hx    Prostate cancer Neg Hx     Social History   Socioeconomic History   Marital status: Widowed    Spouse name: Not on file   Number of children: 3   Years of education: Not on file   Highest education level: Not on file  Occupational History   Not on file  Tobacco Use   Smoking status: Never   Smokeless tobacco: Never  Vaping Use   Vaping Use: Never used  Substance and Sexual Activity   Alcohol use: No    Alcohol/week: 0.0 standard drinks of alcohol   Drug use: No   Sexual activity: Not Currently    Birth control/protection: Post-menopausal  Other Topics Concern   Not on file  Social History Narrative   Husband recently passed away on 05-12-20.   Social Determinants of Health   Financial Resource Strain: Not on file  Food Insecurity: Not on file  Transportation Needs: Not on file  Physical Activity: Not on file  Stress: Not on file  Social Connections: Not on file    Current Medications:  Current Outpatient Medications:    lidocaine (XYLOCAINE) 5 % ointment, Apply 1 Application topically 3 (three) times daily as needed., Disp: 35.44 g, Rfl: 0   amiodarone (PACERONE) 200 MG tablet, Take 1/2 (one-half) tablet by mouth once daily, Disp: 45 tablet, Rfl: 3   apixaban (ELIQUIS) 5 MG TABS tablet, Take 1 tablet (5 mg total) by mouth 2 (two) times daily., Disp: 180 tablet, Rfl: 3   Blood Glucose Monitoring Suppl (ACCU-CHEK AVIVA PLUS) w/Device KIT, , Disp: , Rfl:    cholecalciferol (VITAMIN D3) 25 MCG (1000 UNIT) tablet, Take 1,000 Units by mouth in the morning., Disp: , Rfl:    clobetasol cream (TEMOVATE) 0.05 %, Apply 1 Application topically 2 (two) times daily., Disp: , Rfl:    dapagliflozin propanediol (FARXIGA) 10 MG TABS tablet, Take 1 tablet (10 mg total)  by mouth daily before breakfast., Disp: 90 tablet, Rfl: 3   diphenhydramine-acetaminophen (TYLENOL PM) 25-500 MG TABS tablet, Take 1 tablet by mouth at bedtime as needed (sleep)., Disp: , Rfl:    fenofibrate 160 MG tablet, Take 160 mg by mouth daily with supper. , Disp: , Rfl:    insulin aspart protamine- aspart (NOVOLOG MIX 70/30) (70-30) 100 UNIT/ML injection, Inject 30-36 Units into the skin See admin instructions. Inject 36 units subcutaneously with breakfast & inject 30 units subcutaneously with supper (may adjust based on blood sugar readings), Disp: , Rfl:    isosorbide mononitrate (IMDUR) 60 MG 24 hr tablet, Take 1 tablet by mouth once daily, Disp: 90 tablet, Rfl: 0   levothyroxine (SYNTHROID) 137 MCG tablet, Take 137 mcg by mouth daily before breakfast., Disp: , Rfl:    Loperamide HCl (IMODIUM PO), Take 2 capsules by mouth 2 (two) times daily., Disp: , Rfl:    metoprolol tartrate (LOPRESSOR) 50 MG tablet, Take 1 tablet by mouth twice daily, Disp: 180 tablet, Rfl: 0   nitroGLYCERIN (NITROSTAT) 0.4 MG SL tablet, Place 1 tablet (0.4 mg total) under the tongue every 5 (five) minutes as needed for  chest pain., Disp: 25 tablet, Rfl: 1   olmesartan-hydrochlorothiazide (BENICAR HCT) 40-25 MG tablet, Take 1 tablet by mouth once daily, Disp: 90 tablet, Rfl: 0   spironolactone (ALDACTONE) 25 MG tablet, Take 1 tablet by mouth once daily, Disp: 90 tablet, Rfl: 0   torsemide (DEMADEX) 10 MG tablet, Take 1 tablet (10 mg total) by mouth as needed., Disp: 30 tablet, Rfl: 3  Review of Systems: + shortness of breath, incontinence, joint pain, back pain. Denies appetite changes, fevers, chills, fatigue, unexplained weight changes. Denies hearing loss, neck lumps or masses, mouth sores, ringing in ears or voice changes. Denies cough or wheezing.   Denies chest pain or palpitations. Denies leg swelling. Denies abdominal distention, pain, blood in stools, constipation, diarrhea, nausea, vomiting, or early  satiety. Denies pain with intercourse, dysuria, frequency, hematuria. Denies hot flashes, pelvic pain, vaginal bleeding or vaginal discharge.   Denies muscle pain/cramps. Denies itching, rash, or wounds. Denies dizziness, headaches, numbness or seizures. Denies swollen lymph nodes or glands, denies easy bruising or bleeding. Denies anxiety, depression, confusion, or decreased concentration.  Physical Exam: BP (!) 123/40 (BP Location: Left Arm, Patient Position: Sitting)   Pulse 83   Temp 98.5 F (36.9 C) (Oral)   Ht 5' 2.99" (1.6 m)   Wt 191 lb 6.4 oz (86.8 kg)   SpO2 96%   BMI 33.91 kg/m  General: Alert, oriented, no acute distress. HEENT: Posterior oropharynx clear, sclera anicteric. Chest: Clear to auscultation bilaterally.  No wheezes or rhonchi. Cardiovascular: Regular rate and rhythm, no murmurs. Abdomen: Obese, soft, nontender.  Normoactive bowel sounds.  No masses or hepatosplenomegaly appreciated.  Extremities: Grossly normal range of motion.  Warm, well perfused.  Trace edema bilaterally. Skin: No rashes or lesions noted. Lymphatics: No cervical, supraclavicular, or inguinal adenopathy. GU: External female genitalia atrophic with radiation changes present especially along the perineum and posterior fourchette.  No inguinal adenopathy appreciated.  There is an area of leukoplakia anteriorly as well as some leukoplakia along the perineum, both stable from last visit.  After application of acetic acid, no acetowhite changes, no change to leukoplakia.  Recent biopsy site noted, stitch removed today.  There is some mild skin breakdown around this area.  Laboratory & Radiologic Studies: 06/18/22: A. VULVA BIOPSY:  Ulcer with mild inflammation and focal subepidermal fibrosis.  Negative for dysplasia or malignancy.  See comment.   The biopsy shows crush artifact with a focal area of ulceration and  adjacent intact epidermis shows some epidermal fibrosis strongly  suggestive of  lichen sclerosus.  PAS stain is negative for fungus and  p53 immunohistochemistry is negative.   Assessment & Plan: [name] is a 77 y.o. 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 Completed adjuvant radiation in 12/2020.  Patient is overall doing well.  She continues to have several spots consistent with lichen sclerosus.  Has been having more symptoms related to her anterior lesion which was recently biopsied and showed no dysplasia or malignancy but findings consistent with lichen sclerosus.  The patient has not had significant improvement with use of estrogen on her vulva.  We discussed stopping this and restarting clobetasol twice a week as well as using Vaseline and topical lidocaine.  Prescription for lidocaine sent to her pharmacy today.   Patient's diarrhea has significantly improved although she had fecal incontinence today at her visit.    Per NCCN surveillance recommendations, we will continue with visits  every 3 months.  She continues to prefer to have this done solely in our clinic.  We reviewed signs and symptoms that would be concerning for disease recurrence, and I have stressed the importance of her calling if she develops any of these between visits.  22 minutes of total time was spent for this patient encounter, including preparation, face-to-face counseling with the patient and coordination of care, and documentation of the encounter.  Eugene Garnet, MD  Division of Gynecologic Oncology  Department of Obstetrics and Gynecology  [facility]

## 2022-07-11 DIAGNOSIS — I495 Sick sinus syndrome: Secondary | ICD-10-CM | POA: Diagnosis not present

## 2022-07-11 DIAGNOSIS — Z950 Presence of cardiac pacemaker: Secondary | ICD-10-CM | POA: Diagnosis not present

## 2022-07-23 ENCOUNTER — Ambulatory Visit: Payer: Medicare HMO

## 2022-07-23 DIAGNOSIS — I6523 Occlusion and stenosis of bilateral carotid arteries: Secondary | ICD-10-CM | POA: Diagnosis not present

## 2022-07-24 ENCOUNTER — Other Ambulatory Visit: Payer: Medicare HMO

## 2022-07-24 NOTE — Progress Notes (Signed)
Carotid artery duplex 07/23/2022: Duplex suggests stenosis in the right internal carotid artery (1-15%). < 50% stenosis in the right external carotid artery. Duplex suggests stenosis in the left internal carotid artery (1-15%). <50% stenosis in the left external carotid artery. Right vertebral artery flow is not visualized. Antegrade left vertebral artery flow. No significant change since 10/02/2021, further studies if clinically indicated.   Discuss on OV soon

## 2022-07-31 ENCOUNTER — Ambulatory Visit: Payer: Medicare HMO | Admitting: Cardiology

## 2022-07-31 ENCOUNTER — Encounter: Payer: Self-pay | Admitting: Cardiology

## 2022-07-31 VITALS — BP 124/54 | HR 84 | Ht 62.0 in | Wt 191.0 lb

## 2022-07-31 DIAGNOSIS — I10 Essential (primary) hypertension: Secondary | ICD-10-CM | POA: Diagnosis not present

## 2022-07-31 DIAGNOSIS — Z950 Presence of cardiac pacemaker: Secondary | ICD-10-CM | POA: Diagnosis not present

## 2022-07-31 DIAGNOSIS — I480 Paroxysmal atrial fibrillation: Secondary | ICD-10-CM | POA: Diagnosis not present

## 2022-07-31 DIAGNOSIS — I5032 Chronic diastolic (congestive) heart failure: Secondary | ICD-10-CM | POA: Diagnosis not present

## 2022-07-31 DIAGNOSIS — I6523 Occlusion and stenosis of bilateral carotid arteries: Secondary | ICD-10-CM | POA: Diagnosis not present

## 2022-07-31 NOTE — Progress Notes (Signed)
Primary Physician/Referring:  Merri Brunette, MD  Patient ID: [name], female    DOB: 11/05/1945, 77 y.o.   MRN: [id number]  Chief Complaint  Patient presents with   Chronic diastolic heart failure   Follow-up   Results   sinus node dysfunction    HPI:    [name]  is a 77 y.o. Caucasian female patient with primary hypertension, diabetes mellitus with stage IIIa chronic kidney disease, chronic diastolic heart failure, hypercholesterolemia, nonobstructive CAD (Cath 2019), atrial fibrillation and sick sinus syndrome status post dual-chamber pacemaker 2017, OSA on BiPAP.  Her last cardioversion was 09/07/2018 for atrial flutter.  Dyspnea has remained stable.  She has not had any further hospitalization or ED visit due to dyspnea, leg edema or PND or orthopnea.  Has not had any chest pain.  States that her blood pressure is well-controlled.  Past Medical History:  Diagnosis Date   Arthritis    CAD (coronary artery disease)    Diabetes mellitus without complication (HCC)    Diastolic heart failure (HCC)    Dyspnea    Encounter for care of pacemaker 09/03/2018   Family history of kidney cancer    Family history of throat cancer    Family history of uterine cancer    History of chickenpox    History of radiation therapy    Pelvis 10/23/20-12/14/20- Dr. Antony Blackbird   Hx of psoriatic arthritis    Hyperlipemia    Hypertension    Hypertension 05/03/2018   Hypothyroidism    Mitral valve disorders(424.0)    Myocardial infarction (HCC)    Obstructive sleep apnea    OSA (obstructive sleep apnea)    using BiPAP   Pacemaker    Paroxysmal atrial fibrillation (HCC) 10/11/2016   Peripheral neuropathy    Renal disorder Kidney disease stage2   Sick sinus syndrome [facility])    S/p dual-chamber pacemaker   Thyroid disease hypothyroid   Urine incontinence    Vitamin D deficiency    Vulvar cancer (HCC) 2022   s/p surgery and radiation therapy October-December 2022   Past  Surgical History:  Procedure Laterality Date   BACK SURGERY     BACK SURGERY     BIOPSY  11/20/2021   Procedure: BIOPSY;  Surgeon: Corbin Ade, MD;  Location: [facility];  Service: Endoscopy;;   CARDIAC CATHETERIZATION N/A 04/29/2015   Procedure: Temporary Pacemaker;  Surgeon: Yates Decamp, MD;  Location: [facility] INVASIVE CV LAB;  Service: Cardiovascular;  Laterality: N/A;   CARDIOVERSION N/A 09/07/2018   Procedure: CARDIOVERSION;  Surgeon: Yates Decamp, MD;  Location: [facility] ENDOSCOPY;  Service: Cardiovascular;  Laterality: N/A;   CARDIOVERSION     COLONOSCOPY  08/16/2008   Dr. Jena Gauss; pancolonic diverticulosis, otherwise no abnormalities.  Consider repeat in 10 years.   COLONOSCOPY WITH PROPOFOL N/A 11/20/2021   Procedure: COLONOSCOPY WITH PROPOFOL;  Surgeon: Corbin Ade, MD;  Location: [facility];  Service: Endoscopy;  Laterality: N/A;  8:15am, asa 3   CORONARY PRESSURE/FFR STUDY N/A 08/31/2017   Procedure: INTRAVASCULAR PRESSURE WIRE/FFR STUDY;  Surgeon: Elder Negus, MD;  Location: [facility] INVASIVE CV LAB;  Service: Cardiovascular;  Laterality: N/A;   EP IMPLANTABLE DEVICE N/A 04/30/2015   Procedure: Pacemaker Implant;  Surgeon: Marinus Maw, MD;  Location: [facility] INVASIVE CV LAB;  Service: Cardiovascular;  Laterality: N/A;   LEFT AND RIGHT HEART CATHETERIZATION WITH CORONARY ANGIOGRAM N/A 09/30/2011   Procedure: LEFT AND RIGHT HEART CATHETERIZATION WITH CORONARY ANGIOGRAM;  Surgeon: Pamella Pert, MD;  Location: [facility] CATH LAB;  Service: Cardiovascular;  Laterality: N/A;   POLYPECTOMY  11/20/2021   Procedure: POLYPECTOMY;  Surgeon: Corbin Ade, MD;  Location: [facility];  Service: Endoscopy;;   RIGHT/LEFT HEART CATH AND CORONARY ANGIOGRAPHY N/A 08/31/2017   Procedure: RIGHT/LEFT HEART CATH AND CORONARY ANGIOGRAPHY;  Surgeon: Elder Negus, MD;  Location: [facility] INVASIVE CV LAB;  Service: Cardiovascular;  Laterality: N/A;   TOOTH EXTRACTION  10/07/2018   TUBAL LIGATION      VULVA Ples Specter BIOPSY N/A 07/26/2020   Procedure: VULVAR BIOPSY;  Surgeon: Carver Fila, MD;  Location: [facility] ORS;  Service: Gynecology;  Laterality: N/A;   YAG LASER APPLICATION Right 03/07/2014   Procedure: YAG LASER APPLICATION;  Surgeon: Loraine Leriche T. Nile Riggs, MD;  Location: [facility] ORS;  Service: Ophthalmology;  Laterality: Right;  pt knows to arrive at 11:15   YAG LASER APPLICATION Left 03/21/2014   Procedure: YAG LASER APPLICATION;  Surgeon: Jethro Bolus, MD;  Location: [facility] ORS;  Service: Ophthalmology;  Laterality: Left;     Social History   Tobacco Use   Smoking status: Never   Smokeless tobacco: Never  Substance Use Topics   Alcohol use: No    Alcohol/week: 0.0 standard drinks of alcohol   Marital Status: Widowed  ROS  Review of Systems  Cardiovascular:  Negative for chest pain, dyspnea on exertion and leg swelling.   Objective  Blood pressure (!) 124/54, pulse 84, height 5\' 2"  (1.575 m), weight 191 lb (86.6 kg), SpO2 94%.     07/31/2022    1:22 PM 07/10/2022    4:14 PM 06/18/2022    2:50 PM  Vitals with BMI  Height 5\' 2"  5' 2.992"   Weight 191 lbs 191 lbs 6 oz   BMI 34.93 33.91   Systolic 124 123 295  Diastolic 54 40 50  Pulse 84 83 72     Physical Exam Vitals reviewed.  Constitutional:      Appearance: She is well-developed. She is obese.  Neck:     Vascular: No JVD.  Cardiovascular:     Rate and Rhythm: Normal rate and regular rhythm.     Pulses: Intact distal pulses.          Carotid pulses are 2+ on the right side and 2+ on the left side with bruit.      Popliteal pulses are 1+ on the right side and 1+ on the left side.       Dorsalis pedis pulses are 1+ on the right side and 1+ on the left side.       Posterior tibial pulses are 1+ on the right side and 1+ on the left side.     Heart sounds: S1 normal and S2 normal. Murmur heard.     Midsystolic murmur is present with a grade of 2/6 at the upper right sternal border and apex.     No gallop.  Pulmonary:      Effort: Pulmonary effort is normal. No accessory muscle usage or respiratory distress.     Breath sounds: Normal breath sounds. No wheezing, rhonchi or rales.  Musculoskeletal:     Right lower leg: No edema.     Left lower leg: No edema.     Laboratory examination:   Recent Labs    08/01/21 0851 08/15/21 0953 11/18/21 1042  NA 138 137 137  K 5.8* 4.8 4.8  CL 99 99 102  CO2 27 24 26   GLUCOSE  142* 168* 226*  BUN 20 27 32*  CREATININE 1.53* 1.52* 1.82*  CALCIUM 10.8* 10.5* 10.0  GFRNONAA  --   --  28*      Latest Ref Rng & Units 11/18/2021   10:42 AM 08/15/2021    9:53 AM 08/01/2021    8:51 AM  CMP  Glucose 70 - 99 mg/dL 295  284  132   BUN 8 - 23 mg/dL 32  27  20   Creatinine 0.44 - 1.00 mg/dL 4.40  1.02  7.25   Sodium 135 - 145 mmol/L 137  137  138   Potassium 3.5 - 5.1 mmol/L 4.8  4.8  5.8   Chloride 98 - 111 mmol/L 102  99  99   CO2 22 - 32 mmol/L 26  24  27    Calcium 8.9 - 10.3 mg/dL 36.6  44.0  34.7       Latest Ref Rng & Units 09/03/2020   12:24 PM 07/26/2020   11:15 AM 08/26/2019   10:55 AM  CBC  WBC 4.0 - 10.5 K/uL 8.1  7.9  7.1   Hemoglobin 12.0 - 15.0 g/dL 9.9  42.5  95.6   Hematocrit 36.0 - 46.0 % 31.0  41.7  43.0   Platelets 150 - 400 K/uL 223  148  155    HEMOGLOBIN A1C Lab Results  Component Value Date   HGBA1C 7.8 (H) 07/26/2020   MPG 177.16 07/26/2020   Lab Results  Component Value Date   TSH 4.404 08/26/2019    External labs:   Cholesterol, total 124.000 m 06/27/2022 HDL 16.000 mg 06/27/2022 LDL 58.000 mg 06/27/2022 Triglycerides 320.000 m 06/27/2022  A1C 6.4% 03/12/2022  06/20/2021: Total cholesterol 179, HDL 24, LDL 96, triglycerides 296 TSH 14.2 Hgb 13.5, HCT 41.4, MCV 95.8, platelet 163 ALT 32, AST 22, BUN 23, creatinine 1.46, potassium 5.7, sodium 142, GFR 34 Vitamin D20.2  Radiology:   No results found.  Cardiac Studies:    Lexiscan myoview stress test 06/15/2017: 1. Lexiscan stress test was performed. Exercise capacity was  not assessed. Stress symptoms included dizziness. Peak blood pressure 158/66 mmHg. Stress EKG is non diagnostic for ischemia as it is a pharmacologic stress. In addition, it demonstrated atrial pacing and incomplete LBBB. 2. The overall quality of the study is excellent. There is no evidence of abnormal lung activity. Stress and rest SPECT images demonstrate homogeneous tracer distribution throughout the myocardium. Gated SPECT imaging reveals normal myocardial thickening and wall motion. The left ventricular ejection fraction was normal (71%). 3. Low risk study.   Sleep Study 08/11/2017: Positive for Complex Sleep Apnea; On CPAP    R&LHC 08/31/2017: LCx: Nondominant. AV grove LCx 50-60% diffuse disease. RCA: Large dominant. Ostial PDA 50% stenosis, FFR 0.96. No change from 09/30/2011. RA: 5 mmHg RV: 45/4 mmHg. PA: 40/12 mmHg. Mean PA 29 mmHg PCWP: 16 mmHg LV: 130/3 mmHg, LVEDP 13 mmHg CO: 3.6 L/min. CI 1.9 L/min/m2    Direct current cardioversion 09/07/2018: Indication symptomatic A. Flutter. Procedure: Using 50 mg of IV Propofol and 20 IV Lidocaine (for reducing venous pain) for achieving deep sedation, synchronized direct current cardioversion performed. Patient was delivered with 120 Joules of electricity X 1 with success to A-Paced Rhythm. Patient tolerated the procedure well. No immediate complication noted.  PCV ECHOCARDIOGRAM COMPLETE 11/19/2020 Left ventricle cavity is normal in size. Moderate concentric hypertrophy of the left ventricle. Normal global wall motion. Normal LV systolic function with EF 55%. Diastolic function could not be assessed due  to paced rhythm. Left atrial cavity is mildly dilated. Mild thickening of mitral valve. Mildly restricted mitral valve leaflets without definite stenosis.  Moderate (Grade III) mitral regurgitation. Mild tricuspid regurgitation. Estimated pulmonary artery systolic pressure 37 mmHg. Previous study in 2020 noted severe LA dilatation. No  other significant change noted.  Carotid artery duplex 07/23/2022: Duplex suggests stenosis in the right internal carotid artery (1-15%). < 50% stenosis in the right external carotid artery. Duplex suggests stenosis in the left internal carotid artery (1-15%). <50% stenosis in the left external carotid artery. Right vertebral artery flow is not visualized. Antegrade left vertebral artery flow. No significant change since 10/02/2021, further studies if clinically indicated.  Pacemaker:   Remote dual-chamber pacemaker transmission 07/11/2022: Longevity 7.5 years.  AP 100 %, VP <1 %.  Lead impedance and thresholds are normal.  There were 2 ventricular high rate episodes, brief NSVT, longest 6 seconds.  Normal device function.   EKG:   EKG 07/31/2022: Atrially paced and ventricular sensed rhythm at rate of 75 bpm.  IVCD, incomplete LBBB.  LVH with repolarization abnormality..  No change from 01/30/2022.  Allergies & Allergies   Allergies  Allergen Reactions   Hydrocodone Other (See Comments)    Lethargic    Invokana [Canagliflozin] Palpitations and Other (See Comments)    Made heart race   Oxycodone Other (See Comments)    Lethargic     Current Outpatient Medications:    amiodarone (PACERONE) 200 MG tablet, Take 1/2 (one-half) tablet by mouth once daily, Disp: 45 tablet, Rfl: 3   apixaban (ELIQUIS) 5 MG TABS tablet, Take 1 tablet (5 mg total) by mouth 2 (two) times daily., Disp: 180 tablet, Rfl: 3   Blood Glucose Monitoring Suppl (ACCU-CHEK AVIVA PLUS) w/Device KIT, , Disp: , Rfl:    cholecalciferol (VITAMIN D3) 25 MCG (1000 UNIT) tablet, Take 1,000 Units by mouth in the morning., Disp: , Rfl:    clobetasol cream (TEMOVATE) 0.05 %, Apply 1 Application topically 2 (two) times daily., Disp: , Rfl:    dapagliflozin propanediol (FARXIGA) 10 MG TABS tablet, Take 1 tablet (10 mg total) by mouth daily before breakfast., Disp: 90 tablet, Rfl: 3   diphenhydramine-acetaminophen (TYLENOL PM)  25-500 MG TABS tablet, Take 1 tablet by mouth at bedtime as needed (sleep)., Disp: , Rfl:    fenofibrate 160 MG tablet, Take 160 mg by mouth daily with supper. , Disp: , Rfl:    insulin aspart protamine- aspart (NOVOLOG MIX 70/30) (70-30) 100 UNIT/ML injection, Inject 30-36 Units into the skin See admin instructions. Inject 36 units subcutaneously with breakfast & inject 30 units subcutaneously with supper (may adjust based on blood sugar readings), Disp: , Rfl:    isosorbide mononitrate (IMDUR) 60 MG 24 hr tablet, Take 1 tablet by mouth once daily, Disp: 90 tablet, Rfl: 0   levothyroxine (SYNTHROID) 137 MCG tablet, Take 137 mcg by mouth daily before breakfast., Disp: , Rfl:    lidocaine (XYLOCAINE) 5 % ointment, Apply 1 Application topically 3 (three) times daily as needed., Disp: 35.44 g, Rfl: 0   Loperamide HCl (IMODIUM PO), Take 2 capsules by mouth 2 (two) times daily., Disp: , Rfl:    metoprolol tartrate (LOPRESSOR) 50 MG tablet, Take 1 tablet by mouth twice daily, Disp: 180 tablet, Rfl: 0   nitroGLYCERIN (NITROSTAT) 0.4 MG SL tablet, Place 1 tablet (0.4 mg total) under the tongue every 5 (five) minutes as needed for chest pain., Disp: 25 tablet, Rfl: 1   olmesartan-hydrochlorothiazide (BENICAR HCT) 40-25  MG tablet, Take 1 tablet by mouth once daily, Disp: 90 tablet, Rfl: 0   spironolactone (ALDACTONE) 25 MG tablet, Take 1 tablet by mouth once daily, Disp: 90 tablet, Rfl: 0   torsemide (DEMADEX) 10 MG tablet, Take 1 tablet (10 mg total) by mouth as needed., Disp: 30 tablet, Rfl: 3    Assessment     ICD-10-CM   1. Paroxysmal atrial fibrillation (HCC)  I48.0     2. Chronic diastolic heart failure (HCC)  Z61.09 EKG 12-Lead    3. Primary hypertension  I10     4. Bilateral carotid artery stenosis  I65.23     5. Pacemaker  Medtronic  Medtronic Adapta Dr Baldo Daub, leads only MRI  in situ 04/30/2015  Z95.0       No orders of the defined types were placed in this encounter.   There are no  discontinued medications.   This patients CHA2DS2-VASc Score 7 (CHF, HTN, DM, Vasc, Age, F) and yearly risk of stroke >9.8%.   Recommendations:   [name]  is a 77 y.o. Caucasian female patient with primary hypertension, diabetes mellitus with stage IIIa chronic kidney disease, chronic diastolic heart failure, hypercholesterolemia, nonobstructive CAD (Cath 2019), subsequent atrial fibrillation and sick sinus syndrome status post dual-chamber pacemaker 2017, OSA on BiPAP.  Her last cardioversion was 09/07/2018 for atrial flutter.  1. Paroxysmal atrial fibrillation [facility]) Patient is presently maintaining sinus rhythm.  She is tolerating anticoagulation well.  Renal function is normal.  Continue present dose of Eliquis.  2. Chronic diastolic heart failure (HCC) Patient has not had any further episodes of leg edema, worsening dyspnea or hospitalization from heart failure.  She has not compensated mode. She is presently on metoprolol, olmesartan HCT and spironolactone and she has not used Demadex in quite a long time. - EKG 12-Lead  3. Primary hypertension Blood pressure is well-controlled.  4. Bilateral carotid artery stenosis She has very mild carotid plaque, lipids under excellent control with minimal dose of statin, continue the same.  No further surveillance needed with regard to carotid stenosis.  5. Pacemaker  Medtronic  Medtronic Adapta Dr Baldo Daub, leads only MRI  in situ 04/30/2015 Pacemaker is functioning normally, she has not had any atrial fibrillation episodes.  I will see her back in 1 year or sooner if problems.  Patient has OSA and is compliant with BiPAP, she has not seen Dr. Nicki Guadalajara for the past 2 years, I would recommend that she follow-up with him for evaluation of her sleep disordered breathing.  Otherwise she is doing well.   Yates Decamp, MD, [facility] 07/31/2022, 2:18 PM Office: [phone] Fax: [phone] Pager: [phone]

## 2022-08-02 DIAGNOSIS — R069 Unspecified abnormalities of breathing: Secondary | ICD-10-CM | POA: Diagnosis not present

## 2022-08-02 DIAGNOSIS — I5033 Acute on chronic diastolic (congestive) heart failure: Secondary | ICD-10-CM | POA: Diagnosis not present

## 2022-08-07 ENCOUNTER — Telehealth: Payer: Self-pay | Admitting: *Deleted

## 2022-08-07 NOTE — Telephone Encounter (Signed)
Spoke with [name] in regards to [facility] trying to get in touch with her to schedule an appointment.  Patient states she never received any calls from a urology office and doesn't feel the need to follow up with one at this time. Pt reminded of her follow up appt. With [name] on Friday, Sept. 27 th at 2:45.

## 2022-09-02 ENCOUNTER — Other Ambulatory Visit: Payer: Self-pay | Admitting: Cardiology

## 2022-09-02 DIAGNOSIS — I5033 Acute on chronic diastolic (congestive) heart failure: Secondary | ICD-10-CM | POA: Diagnosis not present

## 2022-09-02 DIAGNOSIS — R069 Unspecified abnormalities of breathing: Secondary | ICD-10-CM | POA: Diagnosis not present

## 2022-09-10 DIAGNOSIS — I129 Hypertensive chronic kidney disease with stage 1 through stage 4 chronic kidney disease, or unspecified chronic kidney disease: Secondary | ICD-10-CM | POA: Diagnosis not present

## 2022-09-10 DIAGNOSIS — E785 Hyperlipidemia, unspecified: Secondary | ICD-10-CM | POA: Diagnosis not present

## 2022-09-10 DIAGNOSIS — E1122 Type 2 diabetes mellitus with diabetic chronic kidney disease: Secondary | ICD-10-CM | POA: Diagnosis not present

## 2022-09-10 DIAGNOSIS — E039 Hypothyroidism, unspecified: Secondary | ICD-10-CM | POA: Diagnosis not present

## 2022-09-10 DIAGNOSIS — E559 Vitamin D deficiency, unspecified: Secondary | ICD-10-CM | POA: Diagnosis not present

## 2022-09-17 DIAGNOSIS — E785 Hyperlipidemia, unspecified: Secondary | ICD-10-CM | POA: Diagnosis not present

## 2022-09-17 DIAGNOSIS — E039 Hypothyroidism, unspecified: Secondary | ICD-10-CM | POA: Diagnosis not present

## 2022-09-17 DIAGNOSIS — I129 Hypertensive chronic kidney disease with stage 1 through stage 4 chronic kidney disease, or unspecified chronic kidney disease: Secondary | ICD-10-CM | POA: Diagnosis not present

## 2022-09-17 DIAGNOSIS — E1122 Type 2 diabetes mellitus with diabetic chronic kidney disease: Secondary | ICD-10-CM | POA: Diagnosis not present

## 2022-09-17 DIAGNOSIS — E559 Vitamin D deficiency, unspecified: Secondary | ICD-10-CM | POA: Diagnosis not present

## 2022-09-26 DIAGNOSIS — E1165 Type 2 diabetes mellitus with hyperglycemia: Secondary | ICD-10-CM | POA: Diagnosis not present

## 2022-09-29 ENCOUNTER — Other Ambulatory Visit: Payer: Self-pay | Admitting: Cardiology

## 2022-09-29 DIAGNOSIS — I2510 Atherosclerotic heart disease of native coronary artery without angina pectoris: Secondary | ICD-10-CM

## 2022-09-29 DIAGNOSIS — I10 Essential (primary) hypertension: Secondary | ICD-10-CM

## 2022-10-03 DIAGNOSIS — R069 Unspecified abnormalities of breathing: Secondary | ICD-10-CM | POA: Diagnosis not present

## 2022-10-03 DIAGNOSIS — I5033 Acute on chronic diastolic (congestive) heart failure: Secondary | ICD-10-CM | POA: Diagnosis not present

## 2022-10-04 ENCOUNTER — Inpatient Hospital Stay (HOSPITAL_COMMUNITY)
Admission: EM | Admit: 2022-10-04 | Discharge: 2022-10-09 | DRG: 291 | Disposition: A | Discharge status: Home / Self Care | Payer: Medicare HMO | Attending: Internal Medicine | Admitting: Internal Medicine

## 2022-10-04 ENCOUNTER — Encounter (HOSPITAL_COMMUNITY): Payer: Self-pay

## 2022-10-04 ENCOUNTER — Other Ambulatory Visit: Payer: Self-pay

## 2022-10-04 ENCOUNTER — Emergency Department (HOSPITAL_COMMUNITY): Payer: Medicare HMO

## 2022-10-04 DIAGNOSIS — E039 Hypothyroidism, unspecified: Secondary | ICD-10-CM

## 2022-10-04 DIAGNOSIS — G4731 Primary central sleep apnea: Secondary | ICD-10-CM

## 2022-10-04 DIAGNOSIS — E871 Hypo-osmolality and hyponatremia: Secondary | ICD-10-CM | POA: Diagnosis present

## 2022-10-04 DIAGNOSIS — Z823 Family history of stroke: Secondary | ICD-10-CM

## 2022-10-04 DIAGNOSIS — Z825 Family history of asthma and other chronic lower respiratory diseases: Secondary | ICD-10-CM

## 2022-10-04 DIAGNOSIS — Z8544 Personal history of malignant neoplasm of other female genital organs: Secondary | ICD-10-CM

## 2022-10-04 DIAGNOSIS — Z888 Allergy status to other drugs, medicaments and biological substances status: Secondary | ICD-10-CM

## 2022-10-04 DIAGNOSIS — I110 Hypertensive heart disease with heart failure: Secondary | ICD-10-CM | POA: Diagnosis not present

## 2022-10-04 DIAGNOSIS — Z811 Family history of alcohol abuse and dependence: Secondary | ICD-10-CM

## 2022-10-04 DIAGNOSIS — I2510 Atherosclerotic heart disease of native coronary artery without angina pectoris: Secondary | ICD-10-CM | POA: Diagnosis present

## 2022-10-04 DIAGNOSIS — I509 Heart failure, unspecified: Secondary | ICD-10-CM | POA: Diagnosis not present

## 2022-10-04 DIAGNOSIS — Z8249 Family history of ischemic heart disease and other diseases of the circulatory system: Secondary | ICD-10-CM

## 2022-10-04 DIAGNOSIS — E1122 Type 2 diabetes mellitus with diabetic chronic kidney disease: Secondary | ICD-10-CM | POA: Diagnosis present

## 2022-10-04 DIAGNOSIS — Z8051 Family history of malignant neoplasm of kidney: Secondary | ICD-10-CM

## 2022-10-04 DIAGNOSIS — C519 Malignant neoplasm of vulva, unspecified: Secondary | ICD-10-CM | POA: Diagnosis not present

## 2022-10-04 DIAGNOSIS — N1832 Chronic kidney disease, stage 3b: Secondary | ICD-10-CM | POA: Diagnosis not present

## 2022-10-04 DIAGNOSIS — I495 Sick sinus syndrome: Secondary | ICD-10-CM | POA: Diagnosis present

## 2022-10-04 DIAGNOSIS — E119 Type 2 diabetes mellitus without complications: Secondary | ICD-10-CM

## 2022-10-04 DIAGNOSIS — Z885 Allergy status to narcotic agent status: Secondary | ICD-10-CM

## 2022-10-04 DIAGNOSIS — I444 Left anterior fascicular block: Secondary | ICD-10-CM | POA: Diagnosis present

## 2022-10-04 DIAGNOSIS — Z833 Family history of diabetes mellitus: Secondary | ICD-10-CM

## 2022-10-04 DIAGNOSIS — E7800 Pure hypercholesterolemia, unspecified: Secondary | ICD-10-CM | POA: Diagnosis present

## 2022-10-04 DIAGNOSIS — Z6831 Body mass index (BMI) 31.0-31.9, adult: Secondary | ICD-10-CM

## 2022-10-04 DIAGNOSIS — J9601 Acute respiratory failure with hypoxia: Secondary | ICD-10-CM | POA: Diagnosis not present

## 2022-10-04 DIAGNOSIS — E669 Obesity, unspecified: Secondary | ICD-10-CM | POA: Diagnosis present

## 2022-10-04 DIAGNOSIS — Z794 Long term (current) use of insulin: Secondary | ICD-10-CM

## 2022-10-04 DIAGNOSIS — I10 Essential (primary) hypertension: Secondary | ICD-10-CM | POA: Diagnosis present

## 2022-10-04 DIAGNOSIS — Z950 Presence of cardiac pacemaker: Secondary | ICD-10-CM

## 2022-10-04 DIAGNOSIS — Z7901 Long term (current) use of anticoagulants: Secondary | ICD-10-CM

## 2022-10-04 DIAGNOSIS — Z8049 Family history of malignant neoplasm of other genital organs: Secondary | ICD-10-CM

## 2022-10-04 DIAGNOSIS — I130 Hypertensive heart and chronic kidney disease with heart failure and stage 1 through stage 4 chronic kidney disease, or unspecified chronic kidney disease: Principal | ICD-10-CM | POA: Diagnosis present

## 2022-10-04 DIAGNOSIS — I5033 Acute on chronic diastolic (congestive) heart failure: Secondary | ICD-10-CM | POA: Diagnosis not present

## 2022-10-04 DIAGNOSIS — Z8261 Family history of arthritis: Secondary | ICD-10-CM

## 2022-10-04 DIAGNOSIS — G629 Polyneuropathy, unspecified: Secondary | ICD-10-CM | POA: Diagnosis present

## 2022-10-04 DIAGNOSIS — Z8041 Family history of malignant neoplasm of ovary: Secondary | ICD-10-CM

## 2022-10-04 DIAGNOSIS — R0602 Shortness of breath: Secondary | ICD-10-CM | POA: Diagnosis not present

## 2022-10-04 DIAGNOSIS — I252 Old myocardial infarction: Secondary | ICD-10-CM

## 2022-10-04 DIAGNOSIS — Z79890 Hormone replacement therapy (postmenopausal): Secondary | ICD-10-CM

## 2022-10-04 DIAGNOSIS — R079 Chest pain, unspecified: Secondary | ICD-10-CM | POA: Diagnosis not present

## 2022-10-04 DIAGNOSIS — K529 Noninfective gastroenteritis and colitis, unspecified: Secondary | ICD-10-CM | POA: Diagnosis not present

## 2022-10-04 DIAGNOSIS — Z800 Family history of malignant neoplasm of digestive organs: Secondary | ICD-10-CM

## 2022-10-04 DIAGNOSIS — N179 Acute kidney failure, unspecified: Secondary | ICD-10-CM | POA: Diagnosis present

## 2022-10-04 DIAGNOSIS — Z79899 Other long term (current) drug therapy: Secondary | ICD-10-CM

## 2022-10-04 DIAGNOSIS — I480 Paroxysmal atrial fibrillation: Secondary | ICD-10-CM | POA: Diagnosis not present

## 2022-10-04 DIAGNOSIS — I440 Atrioventricular block, first degree: Secondary | ICD-10-CM | POA: Diagnosis present

## 2022-10-04 DIAGNOSIS — Z923 Personal history of irradiation: Secondary | ICD-10-CM

## 2022-10-04 DIAGNOSIS — Z83438 Family history of other disorder of lipoprotein metabolism and other lipidemia: Secondary | ICD-10-CM

## 2022-10-04 DIAGNOSIS — G4733 Obstructive sleep apnea (adult) (pediatric): Secondary | ICD-10-CM | POA: Diagnosis present

## 2022-10-04 DIAGNOSIS — G4739 Other sleep apnea: Secondary | ICD-10-CM

## 2022-10-04 DIAGNOSIS — Z808 Family history of malignant neoplasm of other organs or systems: Secondary | ICD-10-CM

## 2022-10-04 LAB — BASIC METABOLIC PANEL
Anion gap: 9 (ref 5–15)
BUN: 19 mg/dL (ref 8–23)
CO2: 28 mmol/L (ref 22–32)
Calcium: 9.9 mg/dL (ref 8.9–10.3)
Chloride: 100 mmol/L (ref 98–111)
Creatinine, Ser: 1.37 mg/dL — ABNORMAL HIGH (ref 0.44–1.00)
GFR, Estimated: 39 mL/min — ABNORMAL LOW (ref >60)
Glucose, Bld: 223 mg/dL — ABNORMAL HIGH (ref 70–99)
Potassium: 4.1 mmol/L (ref 3.5–5.1)
Sodium: 137 mmol/L (ref 135–145)

## 2022-10-04 LAB — HEMOGLOBIN A1C
Hgb A1c MFr Bld: 5.4 % (ref 4.8–5.6)
Mean Plasma Glucose: 108.28 mg/dL

## 2022-10-04 LAB — CBC
HCT: 37.5 % (ref 36.0–46.0)
Hemoglobin: 11.8 g/dL — ABNORMAL LOW (ref 12.0–15.0)
MCH: 30.8 pg (ref 26.0–34.0)
MCHC: 31.5 g/dL (ref 30.0–36.0)
MCV: 97.9 fL (ref 80.0–100.0)
Platelets: 168 10*3/uL (ref 150–400)
RBC: 3.83 MIL/uL — ABNORMAL LOW (ref 3.87–5.11)
RDW: 14.3 % (ref 11.5–15.5)
WBC: 5.4 10*3/uL (ref 4.0–10.5)
nRBC: 0.4 % — ABNORMAL HIGH (ref 0.0–0.2)

## 2022-10-04 LAB — GLUCOSE, CAPILLARY: Glucose-Capillary: 118 mg/dL — ABNORMAL HIGH (ref 70–99)

## 2022-10-04 LAB — TSH: TSH: 1.308 u[IU]/mL (ref 0.350–4.500)

## 2022-10-04 LAB — BRAIN NATRIURETIC PEPTIDE: B Natriuretic Peptide: 778.2 pg/mL — ABNORMAL HIGH (ref 0.0–100.0)

## 2022-10-04 LAB — TROPONIN I (HIGH SENSITIVITY)
Troponin I (High Sensitivity): 19 ng/L — ABNORMAL HIGH (ref <18)
Troponin I (High Sensitivity): 20 ng/L — ABNORMAL HIGH (ref <18)

## 2022-10-04 MED ORDER — APIXABAN 5 MG PO TABS
5.0000 mg | ORAL_TABLET | Freq: Two times a day (BID) | ORAL | Status: DC
  Administered 2022-10-04 – 2022-10-09 (×10): 5 mg via ORAL
  Filled 2022-10-04 (×10): qty 1

## 2022-10-04 MED ORDER — SENNOSIDES-DOCUSATE SODIUM 8.6-50 MG PO TABS: 1.0000 | ORAL_TABLET | Freq: Every evening | ORAL | Status: DC | PRN

## 2022-10-04 MED ORDER — LEVOTHYROXINE SODIUM 25 MCG PO TABS
137.0000 ug | ORAL_TABLET | Freq: Every day | ORAL | Status: DC
  Administered 2022-10-05 – 2022-10-09 (×5): 137 ug via ORAL
  Filled 2022-10-04 (×5): qty 1

## 2022-10-04 MED ORDER — METOPROLOL TARTRATE 50 MG PO TABS
50.0000 mg | ORAL_TABLET | Freq: Two times a day (BID) | ORAL | Status: DC
  Administered 2022-10-04 – 2022-10-08 (×9): 50 mg via ORAL
  Filled 2022-10-04 (×9): qty 1

## 2022-10-04 MED ORDER — FENOFIBRATE 160 MG PO TABS
160.0000 mg | ORAL_TABLET | Freq: Every day | ORAL | Status: DC
  Administered 2022-10-04 – 2022-10-08 (×5): 160 mg via ORAL
  Filled 2022-10-04 (×5): qty 1

## 2022-10-04 MED ORDER — LOPERAMIDE HCL 2 MG PO CAPS: 4.0000 mg | ORAL_CAPSULE | ORAL | Status: DC | PRN

## 2022-10-04 MED ORDER — SODIUM CHLORIDE 0.9% FLUSH
3.0000 mL | Freq: Two times a day (BID) | INTRAVENOUS | Status: DC
  Administered 2022-10-04 – 2022-10-09 (×10): 3 mL via INTRAVENOUS

## 2022-10-04 MED ORDER — VITAMIN D 25 MCG (1000 UNIT) PO TABS
1000.0000 [IU] | ORAL_TABLET | Freq: Every morning | ORAL | Status: DC
  Administered 2022-10-05 – 2022-10-09 (×5): 1000 [IU] via ORAL
  Filled 2022-10-04 (×5): qty 1

## 2022-10-04 MED ORDER — IRBESARTAN 300 MG PO TABS
300.0000 mg | ORAL_TABLET | Freq: Every day | ORAL | Status: DC
  Administered 2022-10-05 – 2022-10-07 (×3): 300 mg via ORAL
  Filled 2022-10-04 (×3): qty 1

## 2022-10-04 MED ORDER — ISOSORBIDE MONONITRATE ER 60 MG PO TB24
60.0000 mg | ORAL_TABLET | Freq: Every day | ORAL | Status: DC
  Administered 2022-10-04 – 2022-10-08 (×5): 60 mg via ORAL
  Filled 2022-10-04 (×2): qty 1
  Filled 2022-10-04: qty 2
  Filled 2022-10-04 (×2): qty 1

## 2022-10-04 MED ORDER — ACETAMINOPHEN 325 MG PO TABS: 650.0000 mg | ORAL_TABLET | ORAL | Status: DC | PRN

## 2022-10-04 MED ORDER — FUROSEMIDE 10 MG/ML IJ SOLN
40.0000 mg | Freq: Every day | INTRAMUSCULAR | Status: DC
  Administered 2022-10-05: 40 mg via INTRAVENOUS
  Filled 2022-10-04: qty 4

## 2022-10-04 MED ORDER — FUROSEMIDE 10 MG/ML IJ SOLN
40.0000 mg | Freq: Once | INTRAMUSCULAR | Status: AC
  Administered 2022-10-04: 40 mg via INTRAVENOUS
  Filled 2022-10-04: qty 4

## 2022-10-04 MED ORDER — SODIUM CHLORIDE 0.9 % IV SOLN: 250.0000 mL | INTRAVENOUS | Status: DC | PRN

## 2022-10-04 MED ORDER — SPIRONOLACTONE 25 MG PO TABS
25.0000 mg | ORAL_TABLET | Freq: Every day | ORAL | Status: DC
  Administered 2022-10-05 – 2022-10-09 (×5): 25 mg via ORAL
  Filled 2022-10-04 (×5): qty 1

## 2022-10-04 MED ORDER — AMIODARONE HCL 100 MG PO TABS
100.0000 mg | ORAL_TABLET | Freq: Every day | ORAL | Status: DC
  Administered 2022-10-05 – 2022-10-09 (×5): 100 mg via ORAL
  Filled 2022-10-04 (×5): qty 1

## 2022-10-04 MED ORDER — SODIUM CHLORIDE 0.9% FLUSH: 3.0000 mL | INTRAVENOUS | Status: DC | PRN

## 2022-10-04 MED ORDER — INSULIN ASPART 100 UNIT/ML IJ SOLN
0.0000 [IU] | Freq: Three times a day (TID) | INTRAMUSCULAR | Status: DC
  Administered 2022-10-05 (×2): 2 [IU] via SUBCUTANEOUS
  Administered 2022-10-05 – 2022-10-06 (×2): 5 [IU] via SUBCUTANEOUS
  Administered 2022-10-06 – 2022-10-08 (×6): 3 [IU] via SUBCUTANEOUS
  Administered 2022-10-08 (×2): 5 [IU] via SUBCUTANEOUS
  Administered 2022-10-09: 3 [IU] via SUBCUTANEOUS

## 2022-10-04 MED ORDER — INSULIN GLARGINE-YFGN 100 UNIT/ML ~~LOC~~ SOLN
10.0000 [IU] | Freq: Every day | SUBCUTANEOUS | Status: DC
  Administered 2022-10-04 – 2022-10-08 (×5): 10 [IU] via SUBCUTANEOUS
  Filled 2022-10-04 (×6): qty 0.1

## 2022-10-04 MED ORDER — ONDANSETRON HCL 4 MG/2ML IJ SOLN: 4.0000 mg | Freq: Four times a day (QID) | INTRAMUSCULAR | Status: DC | PRN

## 2022-10-04 NOTE — Assessment & Plan Note (Signed)
Continue with levothyroxine  

## 2022-10-04 NOTE — ED Notes (Signed)
ED TO INPATIENT HANDOFF REPORT  ED Nurse Name and Phone #: [phone] Marcella Charlson   S Name/Age/Gender [name] 77 y.o. female Room/Bed: 004C/004C  Code Status   Code Status: Full Code  Home/SNF/Other Home Patient oriented to: self, place, time, and situation Is this baseline? Yes   Triage Complete: Triage complete  Chief Complaint Acute on chronic heart failure with preserved ejection fraction (HFpEF) (HCC) [I50.33]  Triage Note PT c/o non radiating midsternal chest pain, fatigue and SOBx4d. Pt able to talk in complete sentences.   Allergies Allergies  Allergen Reactions   Hydrocodone Other (See Comments)    Lethargic    Invokana [Canagliflozin] Palpitations and Other (See Comments)    Made heart race   Oxycodone Other (See Comments)    Lethargic     Level of Care/Admitting Diagnosis ED Disposition     ED Disposition  Admit   Condition  --   Comment  Hospital Area: [facility] [[id number]]  Level of Care: Telemetry Cardiac [[id number]]  May place patient in observation at [facility] or [facility] if equivalent level of care is available:: Yes  Covid Evaluation: Asymptomatic - no recent exposure (last 10 days) testing not required  Diagnosis: Acute on chronic heart failure with preserved ejection fraction (HFpEF) [facility]) [[id number]]  Admitting Physician: Verdene Lennert [[id number]]  Attending Physician: Verdene Lennert [[id number]]          B Medical/Surgery History Past Medical History:  Diagnosis Date   Arthritis    CAD (coronary artery disease)    Diabetes mellitus without complication (HCC)    Diastolic heart failure (HCC)    Dyspnea    Encounter for care of pacemaker 09/03/2018   Family history of kidney cancer    Family history of throat cancer    Family history of uterine cancer    History of chickenpox    History of radiation therapy    Pelvis 10/23/20-12/14/20- Dr. Antony Blackbird   Hx of psoriatic arthritis    Hyperlipemia    Hypertension     Hypertension 05/03/2018   Hypothyroidism    Mitral valve disorders(424.0)    Myocardial infarction (HCC)    Obstructive sleep apnea    OSA (obstructive sleep apnea)    using BiPAP   Pacemaker    Paroxysmal atrial fibrillation (HCC) 10/11/2016   Peripheral neuropathy    Renal disorder Kidney disease stage2   Sick sinus syndrome [facility])    S/p dual-chamber pacemaker   Thyroid disease hypothyroid   Urine incontinence    Vitamin D deficiency    Vulvar cancer (HCC) 2022   s/p surgery and radiation therapy October-December 2022   Past Surgical History:  Procedure Laterality Date   BACK SURGERY     BACK SURGERY     BIOPSY  11/20/2021   Procedure: BIOPSY;  Surgeon: Corbin Ade, MD;  Location: [facility];  Service: Endoscopy;;   CARDIAC CATHETERIZATION N/A 04/29/2015   Procedure: Temporary Pacemaker;  Surgeon: Yates Decamp, MD;  Location: [facility] INVASIVE CV LAB;  Service: Cardiovascular;  Laterality: N/A;   CARDIOVERSION N/A 09/07/2018   Procedure: CARDIOVERSION;  Surgeon: Yates Decamp, MD;  Location: [facility] ENDOSCOPY;  Service: Cardiovascular;  Laterality: N/A;   CARDIOVERSION     COLONOSCOPY  08/16/2008   Dr. Jena Gauss; pancolonic diverticulosis, otherwise no abnormalities.  Consider repeat in 10 years.   COLONOSCOPY WITH PROPOFOL N/A 11/20/2021   Procedure: COLONOSCOPY WITH PROPOFOL;  Surgeon: Corbin Ade, MD;  Location: [facility];  Service: Endoscopy;  Laterality: N/A;  8:15am, asa 3   CORONARY PRESSURE/FFR STUDY N/A 08/31/2017   Procedure: INTRAVASCULAR PRESSURE WIRE/FFR STUDY;  Surgeon: Elder Negus, MD;  Location: [facility] INVASIVE CV LAB;  Service: Cardiovascular;  Laterality: N/A;   EP IMPLANTABLE DEVICE N/A 04/30/2015   Procedure: Pacemaker Implant;  Surgeon: Marinus Maw, MD;  Location: [facility] INVASIVE CV LAB;  Service: Cardiovascular;  Laterality: N/A;   LEFT AND RIGHT HEART CATHETERIZATION WITH CORONARY ANGIOGRAM N/A 09/30/2011   Procedure: LEFT AND RIGHT HEART CATHETERIZATION  WITH CORONARY ANGIOGRAM;  Surgeon: Pamella Pert, MD;  Location: [facility] CATH LAB;  Service: Cardiovascular;  Laterality: N/A;   POLYPECTOMY  11/20/2021   Procedure: POLYPECTOMY;  Surgeon: Corbin Ade, MD;  Location: [facility];  Service: Endoscopy;;   RIGHT/LEFT HEART CATH AND CORONARY ANGIOGRAPHY N/A 08/31/2017   Procedure: RIGHT/LEFT HEART CATH AND CORONARY ANGIOGRAPHY;  Surgeon: Elder Negus, MD;  Location: [facility] INVASIVE CV LAB;  Service: Cardiovascular;  Laterality: N/A;   TOOTH EXTRACTION  10/07/2018   TUBAL LIGATION     VULVA Ples Specter BIOPSY N/A 07/26/2020   Procedure: VULVAR BIOPSY;  Surgeon: Carver Fila, MD;  Location: [facility] ORS;  Service: Gynecology;  Laterality: N/A;   YAG LASER APPLICATION Right 03/07/2014   Procedure: YAG LASER APPLICATION;  Surgeon: Loraine Leriche T. Nile Riggs, MD;  Location: [facility] ORS;  Service: Ophthalmology;  Laterality: Right;  pt knows to arrive at 11:15   YAG LASER APPLICATION Left 03/21/2014   Procedure: YAG LASER APPLICATION;  Surgeon: Jethro Bolus, MD;  Location: [facility] ORS;  Service: Ophthalmology;  Laterality: Left;     A IV Location/Drains/Wounds Patient Lines/Drains/Airways Status     Active Line/Drains/Airways     Name Placement date Placement time Site Days   Peripheral IV 10/04/22 20 G Right Antecubital 10/04/22  1415  Antecubital  less than 1   Incision (Closed) 04/30/15 Chest Left;Anterior;Upper 04/30/15  --  -- 2714   Incision (Closed) 07/26/20 Perineum Other (Comment) 07/26/20  1301  -- 800   Wound 05/30/12 Other (Comment) Leg Left indentation,shiny , thin skin 05/30/12  1620  Leg  3779            Intake/Output Last 24 hours No intake or output data in the 24 hours ending 10/04/22 1746  Labs/Imaging Results for orders placed or performed during the hospital encounter of 10/04/22 (from the past 48 hour(s))  Basic metabolic panel     Status: Abnormal   Collection Time: 10/04/22 12:29 PM  Result Value Ref Range   Sodium 137 135 -  145 mmol/L   Potassium 4.1 3.5 - 5.1 mmol/L   Chloride 100 98 - 111 mmol/L   CO2 28 22 - 32 mmol/L   Glucose, Bld 223 (H) 70 - 99 mg/dL    Comment: Glucose reference range applies only to samples taken after fasting for at least 8 hours.   BUN 19 8 - 23 mg/dL   Creatinine, Ser 1.61 (H) 0.44 - 1.00 mg/dL   Calcium 9.9 8.9 - 09.6 mg/dL   GFR, Estimated 39 (L) >60 mL/min    Comment: (NOTE) Calculated using the CKD-EPI Creatinine Equation (2021)    Anion gap 9 5 - 15    Comment: Performed at [facility], [address] [place], [place] [address]  CBC     Status: Abnormal   Collection Time: 10/04/22 12:29 PM  Result Value Ref Range   WBC 5.4 4.0 - 10.5 K/uL   RBC  3.83 (L) 3.87 - 5.11 MIL/uL   Hemoglobin 11.8 (L) 12.0 - 15.0 g/dL   HCT 21.3 08.6 - 57.8 %   MCV 97.9 80.0 - 100.0 fL   MCH 30.8 26.0 - 34.0 pg   MCHC 31.5 30.0 - 36.0 g/dL   RDW 46.9 62.9 - 52.8 %   Platelets 168 150 - 400 K/uL   nRBC 0.4 (H) 0.0 - 0.2 %    Comment: Performed at [facility], [address], [place], [place] [address]  Troponin I (High Sensitivity)     Status: Abnormal   Collection Time: 10/04/22 12:29 PM  Result Value Ref Range   Troponin I (High Sensitivity) 19 (H) <18 ng/L    Comment: (NOTE) Elevated high sensitivity troponin I (hsTnI) values and significant  changes across serial measurements may suggest ACS but many other  chronic and acute conditions are known to elevate hsTnI results.  Refer to the "Links" section for chest pain algorithms and additional  guidance. Performed at [facility], [address] [place], [place] [address]   Brain natriuretic peptide     Status: Abnormal   Collection Time: 10/04/22  2:17 PM  Result Value Ref Range   B Natriuretic Peptide 778.2 (H) 0.0 - 100.0 pg/mL    Comment: Performed at [facility], [address], [place], [place] [address]  Troponin I (High Sensitivity)     Status: Abnormal   Collection Time: 10/04/22  2:17 PM   Result Value Ref Range   Troponin I (High Sensitivity) 20 (H) <18 ng/L    Comment: (NOTE) Elevated high sensitivity troponin I (hsTnI) values and significant  changes across serial measurements may suggest ACS but many other  chronic and acute conditions are known to elevate hsTnI results.  Refer to the "Links" section for chest pain algorithms and additional  guidance. Performed at [facility], [address], [place], [place] [address]    DG Chest 2 View  Result Date: 10/04/2022 CLINICAL DATA:  Chest pain and shortness of breath for 4 days. EXAM: CHEST - 2 VIEW COMPARISON:  09/13/2019 FINDINGS: The heart size and mediastinal contours are within normal limits. Permanent pacemaker remains in appropriate position. Mild scarring in the right middle lobe and eventration of right hemidiaphragm are stable. No evidence of acute infiltrate or pleural effusion. IMPRESSION: No active cardiopulmonary disease. Electronically Signed   By: Danae Orleans M.D.   On: 10/04/2022 12:55    Pending Labs Unresulted Labs (From admission, onward)     Start     Ordered   10/05/22 0500  Basic metabolic panel  Tomorrow morning,   R        10/04/22 1714   10/05/22 0500  Magnesium  Tomorrow morning,   R        10/04/22 1714   10/04/22 1717  TSH  Add-on,   AD        10/04/22 1716   10/04/22 1716  Pancreatic elastase, fecal  Once,   R        10/04/22 1715   10/04/22 1715  Hemoglobin A1c  Add-on,   AD       Comments: To assess prior glycemic control    10/04/22 1715            Vitals/Pain Today's Vitals   10/04/22 1600 10/04/22 1615 10/04/22 1630 10/04/22 1700  BP: (!) 158/78  (!) 182/107 (!) 197/105  Pulse: 61  61 61  Resp:  19  14 18   Temp:  98.1 F (36.7 C)    TempSrc:      SpO2: 98%  96% 96%  Weight:      Height:      PainSc:        Isolation Precautions No active isolations  Medications Medications  sodium chloride flush (NS) 0.9 % injection 3 mL (has no administration in time  range)  sodium chloride flush (NS) 0.9 % injection 3 mL (has no administration in time range)  0.9 %  sodium chloride infusion (has no administration in time range)  acetaminophen (TYLENOL) tablet 650 mg (has no administration in time range)  ondansetron (ZOFRAN) injection 4 mg (has no administration in time range)  furosemide (LASIX) injection 40 mg (has no administration in time range)  insulin aspart (novoLOG) injection 0-15 Units (has no administration in time range)  insulin glargine-yfgn (SEMGLEE) injection 10 Units (has no administration in time range)  apixaban (ELIQUIS) tablet 5 mg (has no administration in time range)  amiodarone (PACERONE) tablet 100 mg (has no administration in time range)  cholecalciferol (VITAMIN D3) 25 MCG (1000 UNIT) tablet 1,000 Units (has no administration in time range)  fenofibrate tablet 160 mg (has no administration in time range)  levothyroxine (SYNTHROID) tablet 137 mcg (has no administration in time range)  metoprolol tartrate (LOPRESSOR) tablet 50 mg (has no administration in time range)  irbesartan (AVAPRO) tablet 300 mg (has no administration in time range)  spironolactone (ALDACTONE) tablet 25 mg (has no administration in time range)  isosorbide mononitrate (IMDUR) 24 hr tablet 60 mg (has no administration in time range)  loperamide (IMODIUM) capsule 4 mg (has no administration in time range)  senna-docusate (Senokot-S) tablet 1 tablet (has no administration in time range)  furosemide (LASIX) injection 40 mg (40 mg Intravenous Given 10/04/22 1724)    Mobility walks     Focused Assessments Cardiac Assessment Handoff:    Lab Results  Component Value Date   CKTOTAL 509 (H) 05/31/2012   CKMB 6.4 (HH) 05/31/2012   TROPONINI 0.13 (HH) 08/29/2017   Lab Results  Component Value Date   DDIMER 0.29 10/18/2015   Does the Patient currently have chest pain? No    02: 2L Luna R Recommendations: See Admitting Provider Note  Report given to:    Additional Notes:

## 2022-10-04 NOTE — Assessment & Plan Note (Signed)
Patient states she has a history of chronic diarrhea that has been persistent and seems to have started around the time she had radiation for vulvar cancer. In 2023, she had a colonoscopy that was negative for proctitis.  GI infectious panel was negative given patient's history of long-term diabetes, she is certainly at risk for exocrine pancreatic insufficiency.   - Fecal elastase pending - Continue home Imodium as needed

## 2022-10-04 NOTE — ED Provider Notes (Signed)
[place] EMERGENCY DEPARTMENT AT [facility] Provider Note   CSN: [id number] Arrival date & time: 10/04/22  1208     History  Chief Complaint  Patient presents with   Chest Pain    GEETHIKA GLEDHILL is a 77 y.o. female.   Chest Pain 77 year old female history of hypertension, diabetes, CKD, diastolic heart failure, hyperlipidemia, nonobstructive CAD on cath in 2019, atrial fibrillation on Eliquis status post pacemaker presenting for shortness of breath, chest pressure.  She states about a week ago she was taken off of hydrochlorothiazide.  She develops worsening shortness of breath and feels like she is retaining fluid.  She has been put back on torsemide unsure of the dose.  Has been taking this but has had persistent shortness of breath, dyspnea on exertion slightly worse than normal.  She has some chest pressure occasionally as well although this is not exertional.  Some orthopnea 2.  Mild cough nonproductive no fevers or chills.  No pleuritic pain or history of DVT or PE.  She is on Eliquis.  She has not had any leg swelling.     Home Medications Prior to Admission medications   Medication Sig Start Date End Date Taking? Authorizing Provider  amiodarone (PACERONE) 200 MG tablet Take 1/2 (one-half) tablet by mouth once daily Patient taking differently: Take 100 mg by mouth daily. 05/20/22  Yes Yates Decamp, MD  apixaban (ELIQUIS) 5 MG TABS tablet Take 1 tablet (5 mg total) by mouth 2 (two) times daily. 07/09/22  Yes Yates Decamp, MD  cholecalciferol (VITAMIN D3) 25 MCG (1000 UNIT) tablet Take 2,000 Units by mouth in the morning.   Yes [provider]  dapagliflozin propanediol (FARXIGA) 10 MG TABS tablet Take 1 tablet (10 mg total) by mouth daily before breakfast. 02/18/22  Yes Yates Decamp, MD  diphenhydramine-acetaminophen (TYLENOL PM) 25-500 MG TABS tablet Take 2 tablets by mouth at bedtime.   Yes [provider]  fenofibrate 160 MG tablet Take 160 mg by mouth  daily with supper.    Yes [provider]  insulin aspart protamine- aspart (NOVOLOG MIX 70/30) (70-30) 100 UNIT/ML injection Inject 20-30 Units into the skin See admin instructions. Inject 30 units subcutaneously with breakfast & inject 20 units subcutaneously with supper (may adjust based on blood sugar readings)   Yes [provider]  isosorbide mononitrate (IMDUR) 60 MG 24 hr tablet Take 1 tablet by mouth once daily Patient taking differently: Take 60 mg by mouth at bedtime. 09/30/22  Yes Yates Decamp, MD  levothyroxine (SYNTHROID) 137 MCG tablet Take 137 mcg by mouth daily before breakfast.   Yes [provider]  lidocaine (XYLOCAINE) 5 % ointment Apply 1 Application topically 3 (three) times daily as needed. Patient taking differently: Apply 1 Application topically 3 (three) times daily as needed (psoriatic arthritis). 07/10/22  Yes Carver Fila, MD  loperamide (IMODIUM) 2 MG capsule Take 4 mg by mouth 2 (two) times daily.   Yes [provider]  metoprolol tartrate (LOPRESSOR) 50 MG tablet Take 1 tablet by mouth twice daily Patient taking differently: Take 50 mg by mouth 2 (two) times daily. 09/30/22  Yes Yates Decamp, MD  nitroGLYCERIN (NITROSTAT) 0.4 MG SL tablet Place 1 tablet (0.4 mg total) under the tongue every 5 (five) minutes as needed for chest pain. 10/01/15  Yes Orpah Cobb, MD  olmesartan (BENICAR) 40 MG tablet Take 40 mg by mouth daily. 09/17/22  Yes [provider]  spironolactone (ALDACTONE) 25 MG tablet  Take 1 tablet by mouth once daily Patient taking differently: Take 25 mg by mouth at bedtime. 09/02/22  Yes Yates Decamp, MD  torsemide (DEMADEX) 10 MG tablet Take 1 tablet (10 mg total) by mouth as needed. Patient taking differently: Take 10 mg by mouth daily. 07/09/21  Yes Cantwell, Celeste C, PA-C  Blood Glucose Monitoring Suppl (ACCU-CHEK AVIVA PLUS) w/Device KIT     [provider]  clobetasol cream (TEMOVATE) 0.05 % Apply 1  Application topically 2 (two) times daily as needed (rash).    [provider]  olmesartan-hydrochlorothiazide (BENICAR HCT) 40-25 MG tablet Take 1 tablet by mouth once daily Patient not taking: Reported on 10/04/2022 07/07/22   Yates Decamp, MD      Allergies    Hydrocodone, Invokana [canagliflozin], and Oxycodone    Review of Systems   Review of Systems  Cardiovascular:  Positive for chest pain.  Review of systems completed and notable as per HPI.  ROS otherwise negative.   Physical Exam Updated Vital Signs BP (!) 189/59   Pulse 61   Temp 98.2 F (36.8 C) (Oral)   Resp (!) 21   Ht 5\' 2"  (1.575 m)   Wt 79 kg   SpO2 95%   BMI 31.85 kg/m  Physical Exam Vitals and nursing note reviewed.  Constitutional:      General: She is not in acute distress.    Appearance: She is well-developed.  HENT:     Head: Normocephalic and atraumatic.  Eyes:     Conjunctiva/sclera: Conjunctivae normal.  Cardiovascular:     Rate and Rhythm: Normal rate and regular rhythm.     Pulses:          Carotid pulses are 2+ on the right side and 2+ on the left side.      Radial pulses are 2+ on the right side and 2+ on the left side.     Heart sounds: Normal heart sounds. No murmur heard. Pulmonary:     Effort: Pulmonary effort is normal. No respiratory distress.     Breath sounds: Examination of the right-lower field reveals decreased breath sounds. Examination of the left-lower field reveals decreased breath sounds. Decreased breath sounds present.  Abdominal:     Palpations: Abdomen is soft.     Tenderness: There is no abdominal tenderness.  Musculoskeletal:        General: No swelling.     Cervical back: Neck supple.     Right lower leg: No tenderness. No edema.     Left lower leg: No tenderness. No edema.  Skin:    General: Skin is warm and dry.     Capillary Refill: Capillary refill takes less than 2 seconds.  Neurological:     Mental Status: She is alert.  Psychiatric:        Mood  and Affect: Mood normal.     ED Results / Procedures / Treatments   Labs (all labs ordered are listed, but only abnormal results are displayed) Labs Reviewed  BASIC METABOLIC PANEL - Abnormal; Notable for the following components:      Result Value   Glucose, Bld 223 (*)    Creatinine, Ser 1.37 (*)    GFR, Estimated 39 (*)    All other components within normal limits  CBC - Abnormal; Notable for the following components:   RBC 3.83 (*)    Hemoglobin 11.8 (*)    nRBC 0.4 (*)    All other components within normal limits  BRAIN  NATRIURETIC PEPTIDE - Abnormal; Notable for the following components:   B Natriuretic Peptide 778.2 (*)    All other components within normal limits  GLUCOSE, CAPILLARY - Abnormal; Notable for the following components:   Glucose-Capillary 118 (*)    All other components within normal limits  TROPONIN I (HIGH SENSITIVITY) - Abnormal; Notable for the following components:   Troponin I (High Sensitivity) 19 (*)    All other components within normal limits  TROPONIN I (HIGH SENSITIVITY) - Abnormal; Notable for the following components:   Troponin I (High Sensitivity) 20 (*)    All other components within normal limits  HEMOGLOBIN A1C  TSH  BASIC METABOLIC PANEL  MAGNESIUM  PANCREATIC ELASTASE, FECAL    EKG EKG Interpretation Date/Time:  Saturday October 04 2022 12:01:55 EDT Ventricular Rate:  71 PR Interval:  264 QRS Duration:  112 QT Interval:  380 QTC Calculation: 412 R Axis:   -61  Text Interpretation: Atrial-paced rhythm with prolonged AV conduction Left anterior fascicular block Minimal voltage criteria for LVH, may be normal variant ( Cornell product ) Septal infarct , age undetermined Abnormal ECG When compared with ECG of 24-Oct-2017 18:21, PREVIOUS ECG IS PRESENT Confirmed by Fulton Reek [phone]) on 10/04/2022 1:31:00 PM  Radiology DG Chest 2 View  Result Date: 10/04/2022 CLINICAL DATA:  Chest pain and shortness of breath for 4 days.  EXAM: CHEST - 2 VIEW COMPARISON:  09/13/2019 FINDINGS: The heart size and mediastinal contours are within normal limits. Permanent pacemaker remains in appropriate position. Mild scarring in the right middle lobe and eventration of right hemidiaphragm are stable. No evidence of acute infiltrate or pleural effusion. IMPRESSION: No active cardiopulmonary disease. Electronically Signed   By: Danae Orleans M.D.   On: 10/04/2022 12:55    Procedures Procedures    Medications Ordered in ED Medications  sodium chloride flush (NS) 0.9 % injection 3 mL (3 mLs Intravenous Given 10/04/22 2207)  sodium chloride flush (NS) 0.9 % injection 3 mL (has no administration in time range)  0.9 %  sodium chloride infusion (has no administration in time range)  acetaminophen (TYLENOL) tablet 650 mg (has no administration in time range)  ondansetron (ZOFRAN) injection 4 mg (has no administration in time range)  furosemide (LASIX) injection 40 mg (has no administration in time range)  insulin aspart (novoLOG) injection 0-15 Units (has no administration in time range)  insulin glargine-yfgn (SEMGLEE) injection 10 Units (10 Units Subcutaneous Given 10/04/22 2205)  apixaban (ELIQUIS) tablet 5 mg (5 mg Oral Given 10/04/22 2212)  amiodarone (PACERONE) tablet 100 mg (has no administration in time range)  cholecalciferol (VITAMIN D3) 25 MCG (1000 UNIT) tablet 1,000 Units (has no administration in time range)  fenofibrate tablet 160 mg (160 mg Oral Given 10/04/22 2204)  levothyroxine (SYNTHROID) tablet 137 mcg (has no administration in time range)  metoprolol tartrate (LOPRESSOR) tablet 50 mg (50 mg Oral Given 10/04/22 2212)  irbesartan (AVAPRO) tablet 300 mg (has no administration in time range)  spironolactone (ALDACTONE) tablet 25 mg (has no administration in time range)  isosorbide mononitrate (IMDUR) 24 hr tablet 60 mg (60 mg Oral Given 10/04/22 1750)  loperamide (IMODIUM) capsule 4 mg (has no administration in time range)   senna-docusate (Senokot-S) tablet 1 tablet (has no administration in time range)  furosemide (LASIX) injection 40 mg (40 mg Intravenous Given 10/04/22 1724)    ED Course/ Medical Decision Making/ A&P  Medical Decision Making Amount and/or Complexity of Data Reviewed Labs: ordered. Radiology: ordered.  Risk Prescription drug management. Decision regarding hospitalization.   Medical Decision Making:   GAYNOR SANTORI is a 77 y.o. female who presented to the ED today with shortness of breath, chest pressure.  EKG paced rhythm, no significant change from prior signs of ischemia.  She reports double days of shortness of breath, dyspnea exertion, some chest pressure.  She has had recent change in her diuretics, and feels like she is retaining fluid although no JVD or lower extremity edema.  Her oxygen saturation is somewhat borderline around 90% at rest.  Will obtain ambulatory sat.  No history of COPD.  Obtain BMP, interrogate pacemaker as well as obtain troponin for ACS workup.  I reviewed her chest x-ray, I do not see any clear pneumonia, pulmonary edema.  Cardiac silhouette appears stable.  Low suspicion for PE given she is anticoagulated.   Patient placed on continuous vitals and telemetry monitoring while in ED which was reviewed periodically.  Reviewed and confirmed nursing documentation for past medical history, family history, social history.  Reassessment and Plan:   On reassessment she remained stable.  She became hypoxic with ambulation and is now on 2 L.  She is mildly tachypneic.  I do not see any abnormality on her chest x-ray including no pulmonary edema, however her BNP is markedly elevated and I am concerned she is in a heart failure exacerbation.  Lasix ordered per discussed with Dr. Jacinto Halim with cardiology recommends admission for heart failure workup.  Discussed with Dr. Huel Cote and patient was admitted.   Patient's presentation is most  consistent with acute presentation with potential threat to life or bodily function.           Final Clinical Impression(s) / ED Diagnoses Final diagnoses:  Acute on chronic diastolic congestive heart failure [facility])    Rx / DC Orders ED Discharge Orders     None         Laurence Spates, MD 10/04/22 2334

## 2022-10-04 NOTE — ED Notes (Signed)
Pt placed on a pure wick at this time. Ptw. Depends in place.

## 2022-10-04 NOTE — Assessment & Plan Note (Signed)
Patient had a notable desaturation on ambulation into the low 80s, with recovery after initiating 2 L of supplemental oxygen.  In the setting of heart failure exacerbation.  No evidence to suggest pneumonia.  Patient is anticoagulated on Eliquis, so PE unlikely.  - Continue as needed supplemental oxygen with exertion - Wean as tolerated

## 2022-10-04 NOTE — H&P (Addendum)
History and Physical    Patient: [name] HYQ:[id number] DOB: 17-Feb-1945 DOA: 10/04/2022 DOS: the patient was seen and examined on 10/04/2022 PCP: Merri Brunette, MD  Patient coming from: Home  Chief Complaint:  Chief Complaint  Patient presents with   Chest Pain   HPI: [name] is a 77 y.o. female with medical history significant of HFpEF with last EF of 55% (2022), atrial fibrillation/flutter on Eliquis, hypertension, type 2 diabetes, CKD stage IIIb, hyper lipidemia, nonobstructive CAD ([facility] 2019), sick sinus syndrome s/p dual chamber pacemaker, OSA on BiPAP, valvular cancer s/p surgical resection and radiation (2022), hypothyroidism, psoriatic arthritis, who presents to the ED due to shortness of breath and chest pain.  [name] states that her PCP recently took her off hydrochlorothiazide due to hypercalcemia.  Shortly thereafter, she felt that she was retaining fluid and states that since 9/17, she has been experiencing increased shortness of breath at rest, increased dyspnea on exertion, and chest pain that she describes as a pressure.  No palpitations noted.  It can be reproduced with palpation.  She denies any fever, chills, nausea, vomiting, abdominal pain but endorses abdominal distention.  She denies any change in her chronic diarrhea.  ED course: On arrival to the ED, patient was hypertensive at 166/74 with heart rate of 78.  She was saturating at 94% on room air.  She was afebrile at 98.3. Initial workup notable for hemoglobin of 11.8, glucose of 223, BUN 19, creatinine 0.37 with GFR of 39, BNP of 778, and troponin of 19 with flat trend to 20.  Chest x-ray was obtained no acute abnormalities. Patient was started on Lasix and Cardiology consulted. [facility] contacted for admission.   Review of Systems: As mentioned in the history of present illness. All other systems reviewed and are negative.  Past Medical History:  Diagnosis Date   Arthritis    CAD (coronary artery  disease)    Diabetes mellitus without complication (HCC)    Diastolic heart failure (HCC)    Dyspnea    Encounter for care of pacemaker 09/03/2018   Family history of kidney cancer    Family history of throat cancer    Family history of uterine cancer    History of chickenpox    History of radiation therapy    Pelvis 10/23/20-12/14/20- Dr. Antony Blackbird   Hx of psoriatic arthritis    Hyperlipemia    Hypertension    Hypertension 05/03/2018   Hypothyroidism    Mitral valve disorders(424.0)    Myocardial infarction (HCC)    Obstructive sleep apnea    OSA (obstructive sleep apnea)    using BiPAP   Pacemaker    Paroxysmal atrial fibrillation (HCC) 10/11/2016   Peripheral neuropathy    Renal disorder Kidney disease stage2   Sick sinus syndrome [facility])    S/p dual-chamber pacemaker   Thyroid disease hypothyroid   Urine incontinence    Vitamin D deficiency    Vulvar cancer (HCC) 2022   s/p surgery and radiation therapy October-December 2022   Past Surgical History:  Procedure Laterality Date   BACK SURGERY     BACK SURGERY     BIOPSY  11/20/2021   Procedure: BIOPSY;  Surgeon: Corbin Ade, MD;  Location: [facility];  Service: Endoscopy;;   CARDIAC CATHETERIZATION N/A 04/29/2015   Procedure: Temporary Pacemaker;  Surgeon: Yates Decamp, MD;  Location: [facility] INVASIVE CV LAB;  Service: Cardiovascular;  Laterality: N/A;   CARDIOVERSION N/A 09/07/2018   Procedure:  CARDIOVERSION;  Surgeon: Yates Decamp, MD;  Location: [facility] ENDOSCOPY;  Service: Cardiovascular;  Laterality: N/A;   CARDIOVERSION     COLONOSCOPY  08/16/2008   Dr. Jena Gauss; pancolonic diverticulosis, otherwise no abnormalities.  Consider repeat in 10 years.   COLONOSCOPY WITH PROPOFOL N/A 11/20/2021   Procedure: COLONOSCOPY WITH PROPOFOL;  Surgeon: Corbin Ade, MD;  Location: [facility];  Service: Endoscopy;  Laterality: N/A;  8:15am, asa 3   CORONARY PRESSURE/FFR STUDY N/A 08/31/2017   Procedure: INTRAVASCULAR PRESSURE  WIRE/FFR STUDY;  Surgeon: Elder Negus, MD;  Location: [facility] INVASIVE CV LAB;  Service: Cardiovascular;  Laterality: N/A;   EP IMPLANTABLE DEVICE N/A 04/30/2015   Procedure: Pacemaker Implant;  Surgeon: Marinus Maw, MD;  Location: [facility] INVASIVE CV LAB;  Service: Cardiovascular;  Laterality: N/A;   LEFT AND RIGHT HEART CATHETERIZATION WITH CORONARY ANGIOGRAM N/A 09/30/2011   Procedure: LEFT AND RIGHT HEART CATHETERIZATION WITH CORONARY ANGIOGRAM;  Surgeon: Pamella Pert, MD;  Location: [facility] CATH LAB;  Service: Cardiovascular;  Laterality: N/A;   POLYPECTOMY  11/20/2021   Procedure: POLYPECTOMY;  Surgeon: Corbin Ade, MD;  Location: [facility];  Service: Endoscopy;;   RIGHT/LEFT HEART CATH AND CORONARY ANGIOGRAPHY N/A 08/31/2017   Procedure: RIGHT/LEFT HEART CATH AND CORONARY ANGIOGRAPHY;  Surgeon: Elder Negus, MD;  Location: [facility] INVASIVE CV LAB;  Service: Cardiovascular;  Laterality: N/A;   TOOTH EXTRACTION  10/07/2018   TUBAL LIGATION     VULVA Ples Specter BIOPSY N/A 07/26/2020   Procedure: VULVAR BIOPSY;  Surgeon: Carver Fila, MD;  Location: [facility] ORS;  Service: Gynecology;  Laterality: N/A;   YAG LASER APPLICATION Right 03/07/2014   Procedure: YAG LASER APPLICATION;  Surgeon: Loraine Leriche T. Nile Riggs, MD;  Location: [facility] ORS;  Service: Ophthalmology;  Laterality: Right;  pt knows to arrive at 11:15   YAG LASER APPLICATION Left 03/21/2014   Procedure: YAG LASER APPLICATION;  Surgeon: Jethro Bolus, MD;  Location: [facility] ORS;  Service: Ophthalmology;  Laterality: Left;   Social History:  reports that she has never smoked. She has never used smokeless tobacco. She reports that she does not drink alcohol and does not use drugs.  Allergies  Allergen Reactions   Hydrocodone Other (See Comments)    Lethargic    Invokana [Canagliflozin] Palpitations and Other (See Comments)    Made heart race   Oxycodone Other (See Comments)    Lethargic     Family History  Problem Relation Age of  Onset   Arthritis Mother    Diabetes Mother    Heart disease Mother    Hyperlipidemia Mother    Hypertension Mother    Arthritis Father    Asthma Father    Heart attack Father    Hyperlipidemia Father    Hypertension Father    Stroke Father    Arthritis Sister    Diabetes Sister    Hypertension Sister    Arthritis Sister    Diabetes Sister    Hyperlipidemia Sister    Hypertension Sister    Stroke Sister    Ovarian cancer Sister 84   Pancreatic cancer Sister 74   Uterine cancer Sister 59   Hyperlipidemia Sister    Hypertension Sister    Arthritis Brother    Heart attack Brother    Heart disease Brother    Hyperlipidemia Brother    Hypertension Brother    Alcohol abuse Brother    Arthritis Brother    Diabetes Brother    Early death Brother  Heart disease Brother    Hyperlipidemia Brother    Hypertension Brother    Cancer Maternal Aunt 68       unknown type   Throat cancer Maternal Grandfather        hx smoking/drinking   Hypertension Daughter    Cancer Daughter        "female cancer cells"   Kidney cancer Nephew        dx 58s   Colon cancer Neg Hx    Breast cancer Neg Hx    Prostate cancer Neg Hx     Prior to Admission medications   Medication Sig Start Date End Date Taking? Authorizing Provider  amiodarone (PACERONE) 200 MG tablet Take 1/2 (one-half) tablet by mouth once daily 05/20/22   Yates Decamp, MD  apixaban (ELIQUIS) 5 MG TABS tablet Take 1 tablet (5 mg total) by mouth 2 (two) times daily. 07/09/22   Yates Decamp, MD  Blood Glucose Monitoring Suppl (ACCU-CHEK AVIVA PLUS) w/Device KIT     [provider]  cholecalciferol (VITAMIN D3) 25 MCG (1000 UNIT) tablet Take 1,000 Units by mouth in the morning.    [provider]  clobetasol cream (TEMOVATE) 0.05 % Apply 1 Application topically 2 (two) times daily.    [provider]  dapagliflozin propanediol (FARXIGA) 10 MG TABS tablet Take 1 tablet (10 mg total) by mouth daily before  breakfast. 02/18/22   Yates Decamp, MD  diphenhydramine-acetaminophen (TYLENOL PM) 25-500 MG TABS tablet Take 1 tablet by mouth at bedtime as needed (sleep).    [provider]  fenofibrate 160 MG tablet Take 160 mg by mouth daily with supper.     [provider]  insulin aspart protamine- aspart (NOVOLOG MIX 70/30) (70-30) 100 UNIT/ML injection Inject 30-36 Units into the skin See admin instructions. Inject 36 units subcutaneously with breakfast & inject 30 units subcutaneously with supper (may adjust based on blood sugar readings)    [provider]  isosorbide mononitrate (IMDUR) 60 MG 24 hr tablet Take 1 tablet by mouth once daily 09/30/22   Yates Decamp, MD  levothyroxine (SYNTHROID) 137 MCG tablet Take 137 mcg by mouth daily before breakfast.    [provider]  lidocaine (XYLOCAINE) 5 % ointment Apply 1 Application topically 3 (three) times daily as needed. 07/10/22   Carver Fila, MD  Loperamide HCl (IMODIUM PO) Take 2 capsules by mouth 2 (two) times daily.    [provider]  metoprolol tartrate (LOPRESSOR) 50 MG tablet Take 1 tablet by mouth twice daily 09/30/22   Yates Decamp, MD  nitroGLYCERIN (NITROSTAT) 0.4 MG SL tablet Place 1 tablet (0.4 mg total) under the tongue every 5 (five) minutes as needed for chest pain. 10/01/15   Orpah Cobb, MD  olmesartan-hydrochlorothiazide (BENICAR HCT) 40-25 MG tablet Take 1 tablet by mouth once daily 07/07/22   Yates Decamp, MD  spironolactone (ALDACTONE) 25 MG tablet Take 1 tablet by mouth once daily 09/02/22   Yates Decamp, MD  torsemide (DEMADEX) 10 MG tablet Take 1 tablet (10 mg total) by mouth as needed. 07/09/21   Rayford Halsted, PA-C    Physical Exam: Vitals:   10/04/22 1600 10/04/22 1615 10/04/22 1630 10/04/22 1700  BP: (!) 158/78  (!) 182/107 (!) 197/105  Pulse: 61  61 61  Resp: 19  14 18   Temp:  98.1 F (36.7 C)    TempSrc:      SpO2: 98%  96% 96%  Weight:  Height:       Physical  Exam Vitals and nursing note reviewed.  Constitutional:      General: She is not in acute distress.    Appearance: She is obese.  HENT:     Head: Normocephalic and atraumatic.     Mouth/Throat:     Mouth: Mucous membranes are moist.     Pharynx: Oropharynx is clear.  Eyes:     Conjunctiva/sclera: Conjunctivae normal.     Pupils: Pupils are equal, round, and reactive to light.  Neck:     Vascular: Hepatojugular reflux present.  Cardiovascular:     Rate and Rhythm: Normal rate and regular rhythm.     Heart sounds: No murmur heard. Pulmonary:     Effort: Pulmonary effort is normal. Tachypnea present. No respiratory distress.     Breath sounds: Rales (Bibasilar, mild) present. No decreased breath sounds, wheezing or rhonchi.  Abdominal:     General: Bowel sounds are normal. There is distension.     Tenderness: There is no abdominal tenderness.  Musculoskeletal:     Cervical back: Neck supple.     Right lower leg: No edema.     Left lower leg: No edema.  Skin:    General: Skin is warm and dry.  Neurological:     Mental Status: She is alert and oriented to person, place, and time.  Psychiatric:        Mood and Affect: Mood normal.        Behavior: Behavior normal.    Data Reviewed: CBC with WBC of 5.4, hemoglobin of 11.8, MCV of 97, platelets 168 BMP with sodium of 137, potassium 4.1, bicarb 28, glucose 223, BUN 19, creatinine 1.37, GFR 39 BNP 778 Troponin negative at 19 with flat trend to 20  EKG personally reviewed.  Atrial pacing noted with rate of 71.  No ST or T wave changes concerning for acute ischemia.  DG Chest 2 View  Result Date: 10/04/2022 CLINICAL DATA:  Chest pain and shortness of breath for 4 days. EXAM: CHEST - 2 VIEW COMPARISON:  09/13/2019 FINDINGS: The heart size and mediastinal contours are within normal limits. Permanent pacemaker remains in appropriate position. Mild scarring in the right middle lobe and eventration of right hemidiaphragm are stable. No  evidence of acute infiltrate or pleural effusion. IMPRESSION: No active cardiopulmonary disease. Electronically Signed   By: Danae Orleans M.D.   On: 10/04/2022 12:55    Results are pending, will review when available.  Assessment and Plan:  * Acute on chronic heart failure with preserved ejection fraction (HFpEF) (HCC) Patient presenting with increased SOB, DOE and decreased oxygen saturation with ambulation in the setting of markedly increased BNP after diuretic regimen was recently changed (hydrochlorothiazide discontinued 2/2 hypercalcemia and low dose Torsemide started).  - Cardiology consulted by EDP; appreciate their recommendations - Telemetry monitoring - Echocardiogram ordered - Continue Lasix 40 mg IV daily - Strict in and out - Daily weights - Monitor BMP and magnesium daily  Acute hypoxic respiratory failure (HCC) Patient had a notable desaturation on ambulation into the low 80s, with recovery after initiating 2 L of supplemental oxygen.  In the setting of heart failure exacerbation.  No evidence to suggest pneumonia.  Patient is anticoagulated on Eliquis, so PE unlikely.  - Continue as needed supplemental oxygen with exertion - Wean as tolerated  Stage 3b chronic kidney disease (CKD) (HCC) History of CKD stage IIIb in the setting of hypertension and diabetes with creatinine ranging between  1.5 and 1.8 over the last year.  Currently 1.37.  - Continue to monitor renal function while IV diuresing - Daily BMP  CAD (coronary artery disease) History of nonobstructive CAD.  Troponin is only minimally elevated; suspect chest pain reported today's in the setting of heart failure exacerbation.  - Continue home regimen  Primary hypertension Patient's blood pressure is elevated above goal, however patient not experiencing any symptoms at this time and likely related to hypervolemia and she has not taken all her home medications yet today.  - Restart home olmesartan, metoprolol,  Imdur and spironolactone  Paroxysmal atrial fibrillation (HCC) History of atrial fibrillation on Eliquis and amiodarone, however paced at this time.  - Continue home regimen  Diabetes mellitus without complication (HCC) Last A1c available per chart review well-controlled at 6.4% in February 2024.  - Hold home Farxiga and NovoLog 70/30 - SSI, moderate - Semglee 10 units at bedtime  Complex sleep apnea syndrome - BiPAP at bedtime with 4 L bleed in  Vulvar cancer [facility]) History of vulvar cancer s/p radiation and surgery in 2022.  Continues to follow with Gyn-onc.  Hypothyroidism - Continue home Synthroid - TSH pending  Chronic diarrhea Patient states she has a history of chronic diarrhea that has been persistent and seems to have started around the time she had radiation for vulvar cancer. In 2023, she had a colonoscopy that was negative for proctitis.  GI infectious panel was negative given patient's history of long-term diabetes, she is certainly at risk for exocrine pancreatic insufficiency.   - Fecal elastase pending - Continue home Imodium as needed  Advance Care Planning:   Code Status: Full Code verified by patient with daughter at bedside.  Patient states she would not want to be on long-term life support, but is amenable to a short-term resuscitative effort.  Consults: Cardiology  Family Communication: Patient's daughter updated at bedside  Severity of Illness: The appropriate patient status for this patient is OBSERVATION. Observation status is judged to be reasonable and necessary in order to provide the required intensity of service to ensure the patient's safety. The patient's presenting symptoms, physical exam findings, and initial radiographic and laboratory data in the context of their medical condition is felt to place them at decreased risk for further clinical deterioration. Furthermore, it is anticipated that the patient will be medically stable for discharge from  the hospital within 2 midnights of admission.   Author: Verdene Lennert, MD 10/04/2022 5:27 PM  For on call review [email or web address].

## 2022-10-04 NOTE — ED Notes (Signed)
Ambulatory pulse ox pt was sating as low as 85 percent on RA. Pt appeared SOB and pt reports she felt winded after ambulation. Pt placed on 2L [place] and provider made aware.

## 2022-10-04 NOTE — Progress Notes (Signed)
   10/04/22 2249  BiPAP/CPAP/SIPAP  $ Non-Invasive Ventilator  Non-Invasive Vent Initial  $ Face Mask Medium Yes  BiPAP/CPAP/SIPAP Pt Type Adult  BiPAP/CPAP/SIPAP DREAMSTATIOND  IPAP 10 cmH20  EPAP 5 cmH2O  Flow Rate 3 lpm  Patient Home Equipment No  Nasal massage performed Yes  CPAP/SIPAP surface wiped down Yes  BiPAP/CPAP /SiPAP Vitals  Bilateral Breath Sounds Clear

## 2022-10-04 NOTE — Assessment & Plan Note (Deleted)
Patient presenting with increased SOB, DOE and decreased oxygen saturation with ambulation in the setting of markedly increased BNP after diuretic regimen was recently changed (hydrochlorothiazide discontinued 2/2 hypercalcemia and low dose Torsemide started).  - Cardiology consulted by EDP; appreciate their recommendations - Telemetry monitoring - Echocardiogram ordered - Continue Lasix 40 mg IV daily - Strict in and out - Daily weights - Monitor BMP and magnesium daily

## 2022-10-04 NOTE — ED Triage Notes (Signed)
PT c/o non radiating midsternal chest pain, fatigue and SOBx4d. Pt able to talk in complete sentences.

## 2022-10-04 NOTE — Assessment & Plan Note (Signed)
Plan to continue insulin therapy, sliding scale and basal for glucose control and monitoring.  Fasting glucose today is 139

## 2022-10-04 NOTE — Assessment & Plan Note (Signed)
History of CKD stage IIIb in the setting of hypertension and diabetes with creatinine ranging between 1.5 and 1.8 over the last year.  Currently 1.37.  - Continue to monitor renal function while IV diuresing - Daily BMP

## 2022-10-04 NOTE — Assessment & Plan Note (Signed)
No acute coronary syndrome.  Continue blood pressure control.  Continue with fenofibrate.

## 2022-10-04 NOTE — Assessment & Plan Note (Signed)
History of vulvar cancer s/p radiation and surgery in 2022.  Continues to follow with Gyn-onc.

## 2022-10-04 NOTE — Assessment & Plan Note (Addendum)
Patient's blood pressure is elevated above goal, however patient not experiencing any symptoms at this time and likely related to hypervolemia and she has not taken all her home medications yet today.  - Restart home olmesartan, metoprolol, Imdur and spironolactone

## 2022-10-04 NOTE — Assessment & Plan Note (Signed)
-   BiPAP at bedtime with 4 L bleed in

## 2022-10-04 NOTE — Assessment & Plan Note (Addendum)
History of atrial fibrillation on Eliquis and amiodarone, however paced at this time.  - Continue home regimen

## 2022-10-05 ENCOUNTER — Observation Stay (HOSPITAL_COMMUNITY): Payer: Medicare HMO

## 2022-10-05 DIAGNOSIS — I5031 Acute diastolic (congestive) heart failure: Secondary | ICD-10-CM

## 2022-10-05 DIAGNOSIS — E7800 Pure hypercholesterolemia, unspecified: Secondary | ICD-10-CM | POA: Diagnosis not present

## 2022-10-05 DIAGNOSIS — Z79890 Hormone replacement therapy (postmenopausal): Secondary | ICD-10-CM | POA: Diagnosis not present

## 2022-10-05 DIAGNOSIS — N1832 Chronic kidney disease, stage 3b: Secondary | ICD-10-CM | POA: Diagnosis not present

## 2022-10-05 DIAGNOSIS — I130 Hypertensive heart and chronic kidney disease with heart failure and stage 1 through stage 4 chronic kidney disease, or unspecified chronic kidney disease: Secondary | ICD-10-CM | POA: Diagnosis not present

## 2022-10-05 DIAGNOSIS — E119 Type 2 diabetes mellitus without complications: Secondary | ICD-10-CM | POA: Diagnosis not present

## 2022-10-05 DIAGNOSIS — N179 Acute kidney failure, unspecified: Secondary | ICD-10-CM | POA: Diagnosis not present

## 2022-10-05 DIAGNOSIS — Z808 Family history of malignant neoplasm of other organs or systems: Secondary | ICD-10-CM | POA: Diagnosis not present

## 2022-10-05 DIAGNOSIS — E039 Hypothyroidism, unspecified: Secondary | ICD-10-CM

## 2022-10-05 DIAGNOSIS — I495 Sick sinus syndrome: Secondary | ICD-10-CM | POA: Diagnosis not present

## 2022-10-05 DIAGNOSIS — Z950 Presence of cardiac pacemaker: Secondary | ICD-10-CM | POA: Diagnosis not present

## 2022-10-05 DIAGNOSIS — Z8051 Family history of malignant neoplasm of kidney: Secondary | ICD-10-CM | POA: Diagnosis not present

## 2022-10-05 DIAGNOSIS — I5033 Acute on chronic diastolic (congestive) heart failure: Secondary | ICD-10-CM | POA: Diagnosis not present

## 2022-10-05 DIAGNOSIS — Z923 Personal history of irradiation: Secondary | ICD-10-CM | POA: Diagnosis not present

## 2022-10-05 DIAGNOSIS — G629 Polyneuropathy, unspecified: Secondary | ICD-10-CM | POA: Diagnosis not present

## 2022-10-05 DIAGNOSIS — G4731 Primary central sleep apnea: Secondary | ICD-10-CM | POA: Diagnosis not present

## 2022-10-05 DIAGNOSIS — Z794 Long term (current) use of insulin: Secondary | ICD-10-CM | POA: Diagnosis not present

## 2022-10-05 DIAGNOSIS — C519 Malignant neoplasm of vulva, unspecified: Secondary | ICD-10-CM | POA: Diagnosis not present

## 2022-10-05 DIAGNOSIS — I10 Essential (primary) hypertension: Secondary | ICD-10-CM | POA: Diagnosis not present

## 2022-10-05 DIAGNOSIS — I2510 Atherosclerotic heart disease of native coronary artery without angina pectoris: Secondary | ICD-10-CM

## 2022-10-05 DIAGNOSIS — J9601 Acute respiratory failure with hypoxia: Secondary | ICD-10-CM | POA: Diagnosis not present

## 2022-10-05 DIAGNOSIS — E669 Obesity, unspecified: Secondary | ICD-10-CM | POA: Diagnosis not present

## 2022-10-05 DIAGNOSIS — Z8544 Personal history of malignant neoplasm of other female genital organs: Secondary | ICD-10-CM | POA: Diagnosis not present

## 2022-10-05 DIAGNOSIS — I252 Old myocardial infarction: Secondary | ICD-10-CM | POA: Diagnosis not present

## 2022-10-05 DIAGNOSIS — K529 Noninfective gastroenteritis and colitis, unspecified: Secondary | ICD-10-CM | POA: Diagnosis not present

## 2022-10-05 DIAGNOSIS — I509 Heart failure, unspecified: Secondary | ICD-10-CM

## 2022-10-05 DIAGNOSIS — E871 Hypo-osmolality and hyponatremia: Secondary | ICD-10-CM | POA: Diagnosis not present

## 2022-10-05 DIAGNOSIS — E1122 Type 2 diabetes mellitus with diabetic chronic kidney disease: Secondary | ICD-10-CM | POA: Diagnosis not present

## 2022-10-05 DIAGNOSIS — I480 Paroxysmal atrial fibrillation: Secondary | ICD-10-CM | POA: Diagnosis not present

## 2022-10-05 DIAGNOSIS — G4733 Obstructive sleep apnea (adult) (pediatric): Secondary | ICD-10-CM | POA: Diagnosis not present

## 2022-10-05 LAB — ECHOCARDIOGRAM COMPLETE
Area-P 1/2: 3.99 cm2
Height: 62 in
MV VTI: 1.68 cm2
S' Lateral: 2.7 cm
Weight: 2786.61 oz

## 2022-10-05 LAB — BASIC METABOLIC PANEL
Anion gap: 12 (ref 5–15)
BUN: 19 mg/dL (ref 8–23)
CO2: 28 mmol/L (ref 22–32)
Calcium: 10.3 mg/dL (ref 8.9–10.3)
Chloride: 95 mmol/L — ABNORMAL LOW (ref 98–111)
Creatinine, Ser: 1.37 mg/dL — ABNORMAL HIGH (ref 0.44–1.00)
GFR, Estimated: 39 mL/min — ABNORMAL LOW (ref >60)
Glucose, Bld: 139 mg/dL — ABNORMAL HIGH (ref 70–99)
Potassium: 3.6 mmol/L (ref 3.5–5.1)
Sodium: 135 mmol/L (ref 135–145)

## 2022-10-05 LAB — GLUCOSE, CAPILLARY
Glucose-Capillary: 130 mg/dL — ABNORMAL HIGH (ref 70–99)
Glucose-Capillary: 205 mg/dL — ABNORMAL HIGH (ref 70–99)
Glucose-Capillary: 141 mg/dL — ABNORMAL HIGH (ref 70–99)
Glucose-Capillary: 167 mg/dL — ABNORMAL HIGH (ref 70–99)

## 2022-10-05 LAB — MAGNESIUM: Magnesium: 1.7 mg/dL (ref 1.7–2.4)

## 2022-10-05 MED ORDER — CYCLOBENZAPRINE HCL 5 MG PO TABS: 5.0000 mg | ORAL_TABLET | Freq: Once | ORAL | Status: DC

## 2022-10-05 MED ORDER — DAPAGLIFLOZIN PROPANEDIOL 10 MG PO TABS
10.0000 mg | ORAL_TABLET | Freq: Every day | ORAL | Status: DC
  Administered 2022-10-05 – 2022-10-09 (×5): 10 mg via ORAL
  Filled 2022-10-05 (×5): qty 1

## 2022-10-05 MED ORDER — FUROSEMIDE 10 MG/ML IJ SOLN
60.0000 mg | Freq: Once | INTRAMUSCULAR | Status: AC
  Administered 2022-10-05: 60 mg via INTRAVENOUS
  Filled 2022-10-05: qty 6

## 2022-10-05 MED ORDER — POTASSIUM CHLORIDE CRYS ER 20 MEQ PO TBCR
40.0000 meq | EXTENDED_RELEASE_TABLET | Freq: Once | ORAL | Status: AC
  Administered 2022-10-05: 40 meq via ORAL
  Filled 2022-10-05: qty 2

## 2022-10-05 MED ORDER — MAGNESIUM SULFATE 2 GM/50ML IV SOLN
2.0000 g | Freq: Once | INTRAVENOUS | Status: AC
  Administered 2022-10-05: 2 g via INTRAVENOUS
  Filled 2022-10-05: qty 50

## 2022-10-05 MED ORDER — MAGNESIUM OXIDE -MG SUPPLEMENT 400 (240 MG) MG PO TABS
400.0000 mg | ORAL_TABLET | Freq: Once | ORAL | Status: AC
  Administered 2022-10-05: 400 mg via ORAL
  Filled 2022-10-05: qty 1

## 2022-10-05 NOTE — Consult Note (Signed)
CARDIOLOGY CONSULT NOTE  Patient ID: TIERRIA MARTUCCI MRN: [id number] DOB/AGE: 79/14/1947 79 y.o.  Admit date: 10/04/2022 Referring Physician  Erin Hearing, MD Primary Physician:  Merri Brunette, MD Reason for Consultation  CHF and chest pain.   Patient ID: CECEILA MOWAD, female    DOB: 28-Dec-1945, 77 y.o.   MRN: [id number]  Chief Complaint  Patient presents with   Chest Pain   HPI:    GRACE SELLES  is a 77 y.o. Caucasian female patient with primary hypertension, diabetes mellitus with stage IIIa chronic kidney disease, chronic diastolic heart failure, hypercholesterolemia, nonobstructive CAD (Cath 2019), atrial fibrillation and sick sinus syndrome status post dual-chamber pacemaker 2017, OSA on BiPAP.  Her last cardioversion was 09/07/2018 for atrial flutter.   Since 09/30/2022 that is about 5 to 6 days ago she started noticing gradually worsening dyspnea.  She thought that she was going into heart failure again.  Yesterday she also started having heaviness in the chest with marked dyspnea as well and felt that she needs to come to the emergency room patient was found to have elevated BNP with unremarkable cardiac and lung exam and no edema.  She was admitted for heart failure management.  As she has been on long-term anticoagulation with Eliquis, PE was not felt to be the etiology.  I did discuss with the team and felt that she should not have CT angiogram as she also had acute on chronic kidney disease upon presentation.  On further questioning, patient is also felt marked worsening in abdominal distention and heaviness.  No bloody stools, no abdominal pain.  Felt she was very bloated.  Past Medical History:  Diagnosis Date   Arthritis    CAD (coronary artery disease)    Diabetes mellitus without complication (HCC)    Diastolic heart failure (HCC)    Dyspnea    Encounter for care of pacemaker 09/03/2018   Family history of kidney cancer    Family history of throat cancer     Family history of uterine cancer    History of chickenpox    History of radiation therapy    Pelvis 10/23/20-12/14/20- Dr. Antony Blackbird   Hx of psoriatic arthritis    Hyperlipemia    Hypertension    Hypertension 05/03/2018   Hypothyroidism    Mitral valve disorders(424.0)    Myocardial infarction (HCC)    Obstructive sleep apnea    OSA (obstructive sleep apnea)    using BiPAP   Pacemaker    Paroxysmal atrial fibrillation (HCC) 10/11/2016   Peripheral neuropathy    Renal disorder Kidney disease stage2   Sick sinus syndrome [facility])    S/p dual-chamber pacemaker   Thyroid disease hypothyroid   Urine incontinence    Vitamin D deficiency    Vulvar cancer (HCC) 2022   s/p surgery and radiation therapy October-December 2022   Past Surgical History:  Procedure Laterality Date   BACK SURGERY     BACK SURGERY     BIOPSY  11/20/2021   Procedure: BIOPSY;  Surgeon: Corbin Ade, MD;  Location: [facility];  Service: Endoscopy;;   CARDIAC CATHETERIZATION N/A 04/29/2015   Procedure: Temporary Pacemaker;  Surgeon: Yates Decamp, MD;  Location: [facility] INVASIVE CV LAB;  Service: Cardiovascular;  Laterality: N/A;   CARDIOVERSION N/A 09/07/2018   Procedure: CARDIOVERSION;  Surgeon: Yates Decamp, MD;  Location: [facility] ENDOSCOPY;  Service: Cardiovascular;  Laterality: N/A;   CARDIOVERSION     COLONOSCOPY  08/16/2008   Dr. Jena Gauss;  pancolonic diverticulosis, otherwise no abnormalities.  Consider repeat in 10 years.   COLONOSCOPY WITH PROPOFOL N/A 11/20/2021   Procedure: COLONOSCOPY WITH PROPOFOL;  Surgeon: Corbin Ade, MD;  Location: [facility];  Service: Endoscopy;  Laterality: N/A;  8:15am, asa 3   CORONARY PRESSURE/FFR STUDY N/A 08/31/2017   Procedure: INTRAVASCULAR PRESSURE WIRE/FFR STUDY;  Surgeon: Elder Negus, MD;  Location: [facility] INVASIVE CV LAB;  Service: Cardiovascular;  Laterality: N/A;   EP IMPLANTABLE DEVICE N/A 04/30/2015   Procedure: Pacemaker Implant;  Surgeon: Marinus Maw, MD;   Location: [facility] INVASIVE CV LAB;  Service: Cardiovascular;  Laterality: N/A;   LEFT AND RIGHT HEART CATHETERIZATION WITH CORONARY ANGIOGRAM N/A 09/30/2011   Procedure: LEFT AND RIGHT HEART CATHETERIZATION WITH CORONARY ANGIOGRAM;  Surgeon: Pamella Pert, MD;  Location: [facility] CATH LAB;  Service: Cardiovascular;  Laterality: N/A;   POLYPECTOMY  11/20/2021   Procedure: POLYPECTOMY;  Surgeon: Corbin Ade, MD;  Location: [facility];  Service: Endoscopy;;   RIGHT/LEFT HEART CATH AND CORONARY ANGIOGRAPHY N/A 08/31/2017   Procedure: RIGHT/LEFT HEART CATH AND CORONARY ANGIOGRAPHY;  Surgeon: Elder Negus, MD;  Location: [facility] INVASIVE CV LAB;  Service: Cardiovascular;  Laterality: N/A;   TOOTH EXTRACTION  10/07/2018   TUBAL LIGATION     VULVA Ples Specter BIOPSY N/A 07/26/2020   Procedure: VULVAR BIOPSY;  Surgeon: Carver Fila, MD;  Location: [facility] ORS;  Service: Gynecology;  Laterality: N/A;   YAG LASER APPLICATION Right 03/07/2014   Procedure: YAG LASER APPLICATION;  Surgeon: Loraine Leriche T. Nile Riggs, MD;  Location: [facility] ORS;  Service: Ophthalmology;  Laterality: Right;  pt knows to arrive at 11:15   YAG LASER APPLICATION Left 03/21/2014   Procedure: YAG LASER APPLICATION;  Surgeon: Jethro Bolus, MD;  Location: [facility] ORS;  Service: Ophthalmology;  Laterality: Left;   Social History   Tobacco Use   Smoking status: Never   Smokeless tobacco: Never  Substance Use Topics   Alcohol use: No    Alcohol/week: 0.0 standard drinks of alcohol    Family History  Problem Relation Age of Onset   Arthritis Mother    Diabetes Mother    Heart disease Mother    Hyperlipidemia Mother    Hypertension Mother    Arthritis Father    Asthma Father    Heart attack Father    Hyperlipidemia Father    Hypertension Father    Stroke Father    Arthritis Sister    Diabetes Sister    Hypertension Sister    Arthritis Sister    Diabetes Sister    Hyperlipidemia Sister    Hypertension Sister    Stroke Sister     Ovarian cancer Sister 51   Pancreatic cancer Sister 26   Uterine cancer Sister 35   Hyperlipidemia Sister    Hypertension Sister    Arthritis Brother    Heart attack Brother    Heart disease Brother    Hyperlipidemia Brother    Hypertension Brother    Alcohol abuse Brother    Arthritis Brother    Diabetes Brother    Early death Brother    Heart disease Brother    Hyperlipidemia Brother    Hypertension Brother    Cancer Maternal Aunt 53       unknown type   Throat cancer Maternal Grandfather        hx smoking/drinking   Hypertension Daughter    Cancer Daughter        "female cancer cells"  Kidney cancer Nephew        dx 40s   Colon cancer Neg Hx    Breast cancer Neg Hx    Prostate cancer Neg Hx     Marital Status: Widowed  ROS  Review of Systems  Cardiovascular:  Positive for dyspnea on exertion. Negative for chest pain and leg swelling.  Gastrointestinal:  Positive for bloating.   Objective      10/05/2022   11:06 AM 10/05/2022   11:00 AM 10/05/2022    7:27 AM  Vitals with BMI  Systolic 155 155 811  Diastolic 51 51 66  Pulse 60 65 61    Blood pressure (!) 155/51, pulse 60, temperature 98.3 F (36.8 C), temperature source Oral, resp. rate 16, height 5\' 2"  (1.575 m), weight 79 kg, SpO2 90%.    Physical Exam Neck:     Vascular: No carotid bruit or JVD.  Cardiovascular:     Rate and Rhythm: Normal rate and regular rhythm.     Pulses: Intact distal pulses.     Heart sounds: Normal heart sounds. No murmur heard.    No gallop.  Pulmonary:     Effort: Pulmonary effort is normal.     Breath sounds: Normal breath sounds.  Abdominal:     General: Bowel sounds are normal.     Palpations: Abdomen is soft.  Musculoskeletal:     Right lower leg: No edema.     Left lower leg: No edema.    Laboratory examination:   Recent Labs    11/18/21 1042 10/04/22 1229 10/05/22 0245  NA 137 137 135  K 4.8 4.1 3.6  CL 102 100 95*  CO2 26 28 28   GLUCOSE 226* 223* 139*   BUN 32* 19 19  CREATININE 1.82* 1.37* 1.37*  CALCIUM 10.0 9.9 10.3  GFRNONAA 28* 39* 39*   estimated creatinine clearance is 32.4 mL/min (A) (by C-G formula based on SCr of 1.37 mg/dL (H)).     Latest Ref Rng & Units 10/05/2022    2:45 AM 10/04/2022   12:29 PM 11/18/2021   10:42 AM  CMP  Glucose 70 - 99 mg/dL 914  782  956   BUN 8 - 23 mg/dL 19  19  32   Creatinine 0.44 - 1.00 mg/dL 2.13  0.86  5.78   Sodium 135 - 145 mmol/L 135  137  137   Potassium 3.5 - 5.1 mmol/L 3.6  4.1  4.8   Chloride 98 - 111 mmol/L 95  100  102   CO2 22 - 32 mmol/L 28  28  26    Calcium 8.9 - 10.3 mg/dL 46.9  9.9  62.9       Latest Ref Rng & Units 10/04/2022   12:29 PM 09/03/2020   12:24 PM 07/26/2020   11:15 AM  CBC  WBC 4.0 - 10.5 K/uL 5.4  8.1  7.9   Hemoglobin 12.0 - 15.0 g/dL 52.8  9.9  41.3   Hematocrit 36.0 - 46.0 % 37.5  31.0  41.7   Platelets 150 - 400 K/uL 168  223  148    Lipid Panel No results for input(s): "CHOL", "TRIG", "LDLCALC", "VLDL", "HDL", "CHOLHDL", "LDLDIRECT" in the last 8760 hours.  HEMOGLOBIN A1C Lab Results  Component Value Date   HGBA1C 5.4 10/04/2022   MPG 108.28 10/04/2022   TSH Recent Labs    10/04/22 1229  TSH 1.308   BNP (last 3 results) Recent Labs    10/04/22 1417  BNP 778.2*   Cardiac Panel (last 3 results) Recent Labs    10/04/22 1229 10/04/22 1417  TROPONINIHS 19* 20*     Medications and allergies   Allergies  Allergen Reactions   Hydrocodone Other (See Comments)    Lethargic    Invokana [Canagliflozin] Palpitations and Other (See Comments)    Made heart race   Oxycodone Other (See Comments)    Lethargic      Current Meds  Medication Sig   amiodarone (PACERONE) 200 MG tablet Take 1/2 (one-half) tablet by mouth once daily (Patient taking differently: Take 100 mg by mouth daily.)   apixaban (ELIQUIS) 5 MG TABS tablet Take 1 tablet (5 mg total) by mouth 2 (two) times daily.   cholecalciferol (VITAMIN D3) 25 MCG (1000 UNIT) tablet Take  2,000 Units by mouth in the morning.   dapagliflozin propanediol (FARXIGA) 10 MG TABS tablet Take 1 tablet (10 mg total) by mouth daily before breakfast.   diphenhydramine-acetaminophen (TYLENOL PM) 25-500 MG TABS tablet Take 2 tablets by mouth at bedtime.   fenofibrate 160 MG tablet Take 160 mg by mouth daily with supper.    insulin aspart protamine- aspart (NOVOLOG MIX 70/30) (70-30) 100 UNIT/ML injection Inject 20-30 Units into the skin See admin instructions. Inject 30 units subcutaneously with breakfast & inject 20 units subcutaneously with supper (may adjust based on blood sugar readings)   isosorbide mononitrate (IMDUR) 60 MG 24 hr tablet Take 1 tablet by mouth once daily (Patient taking differently: Take 60 mg by mouth at bedtime.)   levothyroxine (SYNTHROID) 137 MCG tablet Take 137 mcg by mouth daily before breakfast.   lidocaine (XYLOCAINE) 5 % ointment Apply 1 Application topically 3 (three) times daily as needed. (Patient taking differently: Apply 1 Application topically 3 (three) times daily as needed (psoriatic arthritis).)   loperamide (IMODIUM) 2 MG capsule Take 4 mg by mouth 2 (two) times daily.   metoprolol tartrate (LOPRESSOR) 50 MG tablet Take 1 tablet by mouth twice daily (Patient taking differently: Take 50 mg by mouth 2 (two) times daily.)   nitroGLYCERIN (NITROSTAT) 0.4 MG SL tablet Place 1 tablet (0.4 mg total) under the tongue every 5 (five) minutes as needed for chest pain.   olmesartan (BENICAR) 40 MG tablet Take 40 mg by mouth daily.   spironolactone (ALDACTONE) 25 MG tablet Take 1 tablet by mouth once daily (Patient taking differently: Take 25 mg by mouth at bedtime.)   torsemide (DEMADEX) 10 MG tablet Take 1 tablet (10 mg total) by mouth as needed. (Patient taking differently: Take 10 mg by mouth daily.)    Scheduled Meds:  amiodarone  100 mg Oral Daily   apixaban  5 mg Oral BID   cholecalciferol  1,000 Units Oral q AM   fenofibrate  160 mg Oral Q supper    furosemide  40 mg Intravenous Daily   insulin aspart  0-15 Units Subcutaneous TID WC   insulin glargine-yfgn  10 Units Subcutaneous QHS   irbesartan  300 mg Oral Daily   isosorbide mononitrate  60 mg Oral Q supper   levothyroxine  137 mcg Oral Q0600   metoprolol tartrate  50 mg Oral BID   sodium chloride flush  3 mL Intravenous Q12H   spironolactone  25 mg Oral Daily   Continuous Infusions:  sodium chloride     PRN Meds:.sodium chloride, acetaminophen, loperamide, ondansetron (ZOFRAN) IV, senna-docusate, sodium chloride flush   I/O last 3 completed shifts: In: -  Out: 700 [Urine:700] No intake/output  data recorded.  Net IO Since Admission: -700 mL [10/05/22 1324]   Radiology:   DG Chest 2 View 10/04/2022 CLINICAL DATA:  Chest pain and shortness of breath for 4 days. EXAM: CHEST - 2 VIEW COMPARISON:  09/13/2019 FINDINGS: The heart size and mediastinal contours are within normal limits. Permanent pacemaker remains in appropriate position. Mild scarring in the right middle lobe and eventration of right hemidiaphragm are stable. No evidence of acute infiltrate or pleural effusion.  Cardiac Studies:   Lexiscan myoview stress test 06/15/2017: 1. Lexiscan stress test was performed. Exercise capacity was not assessed. Stress symptoms included dizziness. Peak blood pressure 158/66 mmHg. Stress EKG is non diagnostic for ischemia as it is a pharmacologic stress. In addition, it demonstrated atrial pacing and incomplete LBBB. 2. The overall quality of the study is excellent. There is no evidence of abnormal lung activity. Stress and rest SPECT images demonstrate homogeneous tracer distribution throughout the myocardium. Gated SPECT imaging reveals normal myocardial thickening and wall motion. The left ventricular ejection fraction was normal (71%). 3. Low risk study.   Sleep Study 08/11/2017: Positive for Complex Sleep Apnea; On CPAP    R&LHC 08/31/2017: LCx: Nondominant. AV grove LCx 50-60%  diffuse disease. RCA: Large dominant. Ostial PDA 50% stenosis, FFR 0.96. No change from 09/30/2011. RA: 5 mmHg RV: 45/4 mmHg. PA: 40/12 mmHg. Mean PA 29 mmHg PCWP: 16 mmHg LV: 130/3 mmHg, LVEDP 13 mmHg CO: 3.6 L/min. CI 1.9 L/min/m2      Carotid artery duplex 07/23/2022: Duplex suggests stenosis in the right internal carotid artery (1-15%). < 50% stenosis in the right external carotid artery. Duplex suggests stenosis in the left internal carotid artery (1-15%). <50% stenosis in the left external carotid artery. Right vertebral artery flow is not visualized. Antegrade left vertebral artery flow. No significant change since 10/02/2021, further studies if clinically indicated.  Echocardiogram 10/05/2022:   1. Left ventricular ejection fraction, by estimation, is 65 to 70%. The left ventricle has normal function. The left ventricle has no regional wall motion abnormalities. There is moderate left ventricular hypertrophy. Left ventricular diastolic  parameters are indeterminate.  2. Right ventricular systolic function is normal. The right ventricular size is normal.  3. Left atrial size was mildly dilated.  4. The mitral valve is normal in structure. Mild mitral valve regurgitation. No evidence of mitral stenosis. The mean mitral valve gradient is 3.0 mmHg.  5. The aortic valve is normal in structure. Aortic valve regurgitation is not visualized. No aortic stenosis is present.  6. The inferior vena cava is normal in size with greater than 50% respiratory variability, suggesting right atrial pressure of 3 mmHg.  EKG:  EKG 10/04/2022: Atrially paced and ventricular sensed rhythm with first-degree AV block at rate of 71 bpm, left anterior fascicular block.  Poor R progression, cannot exclude anteroseptal infarct old.  Nonspecific T abnormality high lateral leads.  Assessment   1.  Acute on chronic diastolic heart failure 2.  Sinus node dysfunction, paroxysmal atrial fibrillation SP dual-chamber  pacemaker implantation on 04/30/2015. This patients CHA2DS2-VASc Score 7 (CHF, HTN, DM, Vasc, Age, F) and yearly risk of stroke >9.8%.  3.  Diabetes mellitus with stage IIIb chronic kidney disease 4.  Truncal obesity  Recommendations:   RAMIAH MININNI  is a 78 y.o. Caucasian female patient with primary hypertension, diabetes mellitus with stage IIIa chronic kidney disease, chronic diastolic heart failure, hypercholesterolemia, nonobstructive CAD (Cath 2019), atrial fibrillation and sick sinus syndrome status post dual-chamber pacemaker 2017, OSA on  BiPAP.  Her last cardioversion was 09/07/2018 for atrial flutter.   Patient chest pain appears to be musculoskeletal or could be related to underlying acute decompensated congestive heart failure.  Her presentation is most consistent with acute diastolic heart failure with abdominal distention and bloating.  She has no leg edema, there is mild JVD which is improved.  She is now on appropriate medical therapy and diet Chauncey Mann to her present medical regimen both for renal protection and also for heart failure standpoint. Does not appear to be on hospital formulary. She was on Farxiga 10 mg daily in the outpatient basis as well.  Will restart this as well.  Continue with IV furosemide 40 mg once a day for the next 1-2 doses only and once she feels better, can be discharged home with outpatient follow-up.  Pacemaker is functioning normally.  Diabetes is very well-controlled as well.  Renal function has remained stable although when she presented creatinine had creeped up.  Please call if questions.  Advised the patient to weigh herself on a daily basis upon discharge.  She used to do this before previously and we could have prevented the hospitalization.  Patient is very compliant.   Yates Decamp, MD, [facility] 10/05/2022, 1:24 PM Office: [phone]

## 2022-10-05 NOTE — Hospital Course (Signed)
Mrs. Rayborn was admitted to the hospital with the working diagnosis of heart failure decompensation.   77 yo female with the past medical history of heart failure, atrial fibrillation, hypertension, T2DM, CKD, dyslipidemia, coronary artery disease, sick sinus syndrome sp pacemaker implantation, obesity and OSA who presented with chest pain and dyspnea. Endorsed 5 days of worsening edema, dyspnea, and chest pain. Worsening symptoms with exertion. Pressure type chest pain. She was recently taken off hydrochlorothiazide due to hypercalcemia. On her initial physical examination her blood pressure was 166/74, HR 78, RR 19 and 02 saturation 96%, lungs with bilateral rales with no wheezing, heart with S1 and S2 present and regular, positive JVD, abdomen distended and no lower extremity edema.   Na 137, K 4,1 Cl 100, bicarbonate 28, glucose 223, bun 19 cr 1,37  BNP 778  High sensitive troponin 19 and 20 Wbc 5,4 hgb 11,8 plt 168   Chest radiograph with no cardiomegaly, bilateral hilar vascular congestion with cephalization of the vasculature. Pacemaker in place with one right atrial and one right ventricular lead.   EKG 71 bpm, left axis deviation, normal intervals, atrial pacing with 1st degree AV block, ventricular sensing, q waves V1 and V2, no significant ST segment or T wave changes.   09/22 patient had diuresis with improvement in her volume status.  Plan for possible discharge home tomorrow.  09/23 renal function not yet stable, positive hyponatremia.

## 2022-10-05 NOTE — Assessment & Plan Note (Addendum)
Echocardiogram with preserved LV systolic function with EF 65 to 70%, moderate LVH, RV with preserved systolic function, LA with mild dilatation, mild MR,   Urine output documented 700 cc Systolic blood pressure 134 to 154 mmHg.   Plan to continue diuresis with IV furosemide, will increase to 60 mg bid.  Continue with irbesartan, metoprolol, isosorbide, spironolactone and dapagliflozin.   Acute hypoxemic respiratory failure due to acute cardiogenic pulmonary edema.  02 saturation today is 92 % on 2 L/min per [place] and 90% on room air.  Continue diuresis and oxymetry monitoring.

## 2022-10-05 NOTE — Assessment & Plan Note (Signed)
Renal function with serum cr at 1,37 with K at 3,6 and serum bicarbonate at 28. Na 135 and Mg 1.7   Plan to add 40 meq Kcl and 4 g mag sulfate Continue diuresis with furosemide Follow up renal function and electrolytes in am.

## 2022-10-05 NOTE — Plan of Care (Signed)
  Problem: Education: Goal: Ability to describe self-care measures that may prevent or decrease complications (Diabetes Survival Skills Education) will improve Outcome: Progressing Goal: Individualized Educational Video(s) Outcome: Progressing   Problem: Coping: Goal: Ability to adjust to condition or change in health will improve Outcome: Progressing   Problem: Health Behavior/Discharge Planning: Goal: Ability to identify and utilize available resources and services will improve Outcome: Progressing Goal: Ability to manage health-related needs will improve Outcome: Progressing   Problem: Fluid Volume: Goal: Ability to maintain a balanced intake and output will improve Outcome: Progressing

## 2022-10-05 NOTE — Progress Notes (Signed)
Progress Note   Patient: [name]:[id number] DOB: 05-12-1945 DOA: 10/04/2022     0 DOS: the patient was seen and examined on 10/05/2022   Brief hospital course: Mrs. Treto was admitted to the hospital with the working diagnosis of heart failure decompensation.   77 yo female with the past medical history of heart failure, atrial fibrillation, hypertension, T2DM, CKD, dyslipidemia, coronary artery disease, sick sinus syndrome sp pacemaker implantation, obesity and OSA who presented with chest pain and dyspnea. Endorsed 5 days of worsening edema, dyspnea, and chest pain. Worsening symptoms with exertion. Pressure type chest pain. She was recently taken off hydrochlorothiazide due to hypercalcemia. On her initial physical examination her blood pressure was 166/74, HR 78, RR 19 and 02 saturation 96%, lungs with bilateral rales with no wheezing, heart with S1 and S2 present and regular, positive JVD, abdomen distended and no lower extremity edema.   Na 137, K 4,1 Cl 100, bicarbonate 28, glucose 223, bun 19 cr 1,37  BNP 778  High sensitive troponin 19 and 20 Wbc 5,4 hgb 11,8 plt 168   Chest radiograph with no cardiomegaly, bilateral hilar vascular congestion with cephalization of the vasculature. Pacemaker in place with one right atrial and one right ventricular lead.   EKG 71 bpm, left axis deviation, normal intervals, atrial pacing with 1st degree AV block, ventricular sensing, q waves V1 and V2, no significant ST segment or T wave changes.   Assessment and Plan: * Acute on chronic diastolic CHF (congestive heart failure) (HCC) Echocardiogram with preserved LV systolic function with EF 65 to 70%, moderate LVH, RV with preserved systolic function, LA with mild dilatation, mild MR,   Urine output documented 700 cc Systolic blood pressure 134 to 154 mmHg.   Plan to continue diuresis with IV furosemide, will increase to 60 mg bid.  Continue with irbesartan, metoprolol, isosorbide,  spironolactone and dapagliflozin.   Acute hypoxemic respiratory failure due to acute cardiogenic pulmonary edema.  02 saturation today is 92 % on 2 L/min per [place] and 90% on room air.  Continue diuresis and oxymetry monitoring.   Stage 3b chronic kidney disease (CKD) (HCC) Renal function with serum cr at 1,37 with K at 3,6 and serum bicarbonate at 28. Na 135 and Mg 1.7   Plan to add 40 meq Kcl and 4 g mag sulfate Continue diuresis with furosemide Follow up renal function and electrolytes in am.   CAD (coronary artery disease) History of nonobstructive CAD.  Troponin is only minimally elevated; suspect chest pain reported today's in the setting of heart failure exacerbation.  - Continue home regimen  Primary hypertension Blood pressure has improved, continue with metoprolol, irebesartan and isosorbide.  Diuretic therapy with spironolactone, SGLT 2 inh and furosemide.    Paroxysmal atrial fibrillation (HCC) Continue amiodarone for rate and rhythm control. Continue anticoagulation with apixaban. Continue telemetry monitoring.   Diabetes mellitus without complication (HCC) Plan to continue insulin therapy, sliding scale and basal for glucose control and monitoring.  Fasting glucose today is 139   Complex sleep apnea syndrome - BiPAP at bedtime with 4 L bleed in  Vulvar cancer [facility]) History of vulvar cancer s/p radiation and surgery in 2022.  Continues to follow with Gyn-onc.  Hypothyroidism - Continue home Synthroid - TSH pending  Chronic diarrhea Patient states she has a history of chronic diarrhea that has been persistent and seems to have started around the time she had radiation for vulvar cancer. In 2023, she had a colonoscopy that was  negative for proctitis.  GI infectious panel was negative given patient's history of long-term diabetes, she is certainly at risk for exocrine pancreatic insufficiency.   - Fecal elastase pending - Continue home Imodium as needed         Subjective: Patient with improved dyspnea but not back to baseline, continue to have fullness in her abdomen, no chest pain   Physical Exam: Vitals:   10/05/22 0434 10/05/22 0727 10/05/22 1100 10/05/22 1106  BP: (!) 134/55 (!) 153/66 (!) 155/51 (!) 155/51  Pulse: 60 61 65 60  Resp: 18 18 17 16   Temp: 98.1 F (36.7 C) 98.6 F (37 C) 98.3 F (36.8 C) 98.3 F (36.8 C)  TempSrc: Oral Oral Oral Oral  SpO2: 97% 96% 92% 90%  Weight: 79 kg     Height:       Neurology awake and alert ENT with mild pallor Cardiovascular with S1 and S2 present and regular with no gallops, or rubs, no murmurs Respiratory with mild rales at bases with no wheezing or rhonchi Abdomen with mild distention, no ascites or tenderness No lower extremity edema  Data Reviewed:    Family Communication: no family at the bedside   Disposition: Status is: Observation The patient remains OBS appropriate and will d/c before 2 midnights.  Planned Discharge Destination: Home    Author: Coralie Keens, MD 10/05/2022 1:51 PM  For on call review [email or web address].

## 2022-10-05 NOTE — Progress Notes (Signed)
  Echocardiogram 2D Echocardiogram has been performed.  [name] 10/05/2022, 9:43 AM

## 2022-10-05 NOTE — Progress Notes (Signed)
  Echocardiogram 2D Echocardiogram has been performed.  Delcie Roch 10/05/2022, 9:43 AM

## 2022-10-06 ENCOUNTER — Encounter (HOSPITAL_COMMUNITY): Payer: Self-pay | Admitting: Internal Medicine

## 2022-10-06 DIAGNOSIS — I10 Essential (primary) hypertension: Secondary | ICD-10-CM | POA: Diagnosis not present

## 2022-10-06 DIAGNOSIS — N1832 Chronic kidney disease, stage 3b: Secondary | ICD-10-CM | POA: Diagnosis not present

## 2022-10-06 DIAGNOSIS — I2510 Atherosclerotic heart disease of native coronary artery without angina pectoris: Secondary | ICD-10-CM | POA: Diagnosis not present

## 2022-10-06 DIAGNOSIS — I5033 Acute on chronic diastolic (congestive) heart failure: Secondary | ICD-10-CM | POA: Diagnosis not present

## 2022-10-06 LAB — MAGNESIUM: Magnesium: 2.5 mg/dL — ABNORMAL HIGH (ref 1.7–2.4)

## 2022-10-06 LAB — GLUCOSE, CAPILLARY
Glucose-Capillary: 179 mg/dL — ABNORMAL HIGH (ref 70–99)
Glucose-Capillary: 201 mg/dL — ABNORMAL HIGH (ref 70–99)
Glucose-Capillary: 176 mg/dL — ABNORMAL HIGH (ref 70–99)
Glucose-Capillary: 227 mg/dL — ABNORMAL HIGH (ref 70–99)

## 2022-10-06 LAB — BASIC METABOLIC PANEL
Anion gap: 12 (ref 5–15)
BUN: 26 mg/dL — ABNORMAL HIGH (ref 8–23)
CO2: 30 mmol/L (ref 22–32)
Calcium: 11.3 mg/dL — ABNORMAL HIGH (ref 8.9–10.3)
Chloride: 91 mmol/L — ABNORMAL LOW (ref 98–111)
Creatinine, Ser: 1.72 mg/dL — ABNORMAL HIGH (ref 0.44–1.00)
GFR, Estimated: 30 mL/min — ABNORMAL LOW (ref >60)
Glucose, Bld: 166 mg/dL — ABNORMAL HIGH (ref 70–99)
Potassium: 4.4 mmol/L (ref 3.5–5.1)
Sodium: 133 mmol/L — ABNORMAL LOW (ref 135–145)

## 2022-10-06 MED ORDER — TORSEMIDE 20 MG PO TABS
20.0000 mg | ORAL_TABLET | Freq: Every day | ORAL | Status: DC
  Filled 2022-10-06: qty 1

## 2022-10-06 NOTE — Evaluation (Signed)
Physical Therapy Evaluation Patient Details Name: [name] MRN: [id number] DOB: 21-Apr-1945 Today's Date: 10/06/2022  History of Present Illness  Pt is a 77 y/o F admitted on 10/04/22 after presenting with c/o chest pain & dyspnea. Pt is being treated for acute on chronic diastolic CHF. PMH: heart failure, a-fib, HTN, DM2, CKD, dyslipidemia, CAD, SSS s/p pacemaker, obesity, OSA, vulvar CA  Clinical Impression  Pt seen for PT evaluation with pt agreeable to tx. Pt reports prior to admission she was independent without AD, denying falls. On this date, pt ambulates without AD with supervision with guarded gait & reaching for furniture for occasional support. PT provided pt with RW & pt able to ambulate increased distances without LOB but 1 brief standing rest break 2/2 fatigue.  Pt reports feeling "weak as water" & notes 10/10 RPE fatigue. PT encouraged RW use until BLE strength improves - anticipate pt will progress well as she continues to mobilize.       If plan is discharge home, recommend the following: A little help with bathing/dressing/bathroom;Assistance with cooking/housework;A little help with walking and/or transfers;Help with stairs or ramp for entrance;Assist for transportation   Can travel by private vehicle        Equipment Recommendations None recommended by PT  Recommendations for Other Services       Functional Status Assessment Patient has had a recent decline in their functional status and demonstrates the ability to make significant improvements in function in a reasonable and predictable amount of time.     Precautions / Restrictions Precautions Precautions: Fall Restrictions Weight Bearing Restrictions: No      Mobility  Bed Mobility Overal bed mobility: Modified Independent             General bed mobility comments: uses bed rails to assist with supine>sit from flat bed    Transfers Overall transfer level: Needs assistance Equipment used:  None Transfers: Sit to/from Stand Sit to Stand: Supervision                Ambulation/Gait Ambulation/Gait assistance: Supervision Gait Distance (Feet): 10 Feet (+ 100 ft) Assistive device: None, Rolling walker (2 wheels) Gait Pattern/deviations: Decreased step length - right, Decreased step length - left, Decreased stride length Gait velocity: decreased     General Gait Details: Pt ambulates in room without AD but guarded gait, holding to bed rail, sink when able. Pt reports she has a RW at home she can use & PT provided pt with RW. Pt ambulates in hallway with improved stability, does require 1 brief standing rest break 2/2 fatigue.  Stairs            Wheelchair Mobility     Tilt Bed    Modified Rankin (Stroke Patients Only)       Balance Overall balance assessment: Needs assistance   Sitting balance-Leahy Scale: Good     Standing balance support: During functional activity, No upper extremity supported Standing balance-Leahy Scale: Fair                               Pertinent Vitals/Pain Pain Assessment Pain Assessment: No/denies pain    Home Living Family/patient expects to be discharged to:: Private residence Living Arrangements: Children (daughter) Available Help at Discharge: Family;Available 24 hours/day (daughter has had a stroke, pt reports they both help each other) Type of Home: House Home Access: Stairs to enter Entrance Stairs-Rails: Left Entrance Stairs-Number of Steps: 1  Home Layout: One level Home Equipment: Agricultural consultant (2 wheels);Wheelchair - manual;BSC/3in1;Cane - single point      Prior Function Prior Level of Function : Independent/Modified Independent;Driving             Mobility Comments: Independent without AD, denies falls ADLs Comments: ind     Extremity/Trunk Assessment   Upper Extremity Assessment Upper Extremity Assessment: Overall WFL for tasks assessed    Lower Extremity  Assessment Lower Extremity Assessment: Generalized weakness    Cervical / Trunk Assessment Cervical / Trunk Assessment: Normal  Communication   Communication Communication: No apparent difficulties  Cognition Arousal: Alert Behavior During Therapy: WFL for tasks assessed/performed Overall Cognitive Status: Within Functional Limits for tasks assessed                                          General Comments General comments (skin integrity, edema, etc.): Pt reports 10/10 RPE after gait. SpO2 >90% on room air.    Exercises     Assessment/Plan    PT Assessment Patient needs continued PT services  PT Problem List Decreased strength;Decreased activity tolerance;Decreased balance;Decreased mobility;Decreased knowledge of use of DME       PT Treatment Interventions DME instruction;Balance training;Gait training;Stair training;Functional mobility training;Therapeutic activities;Therapeutic exercise;Manual techniques;Patient/family education    PT Goals (Current goals can be found in the Care Plan section)  Acute Rehab PT Goals Patient Stated Goal: get better PT Goal Formulation: With patient Time For Goal Achievement: 10/20/22 Potential to Achieve Goals: Good    Frequency Min 1X/week     Co-evaluation               AM-PAC PT "6 Clicks" Mobility  Outcome Measure Help needed turning from your back to your side while in a flat bed without using bedrails?: None Help needed moving from lying on your back to sitting on the side of a flat bed without using bedrails?: None Help needed moving to and from a bed to a chair (including a wheelchair)?: A Little Help needed standing up from a chair using your arms (e.g., wheelchair or bedside chair)?: A Little Help needed to walk in hospital room?: A Little Help needed climbing 3-5 steps with a railing? : A Little 6 Click Score: 20    End of Session   Activity Tolerance: Patient tolerated treatment well;Patient  limited by fatigue Patient left: in bed;with call bell/phone within reach;with bed alarm set (sitting EOB) Nurse Communication: Mobility status PT Visit Diagnosis: Muscle weakness (generalized) (M62.81);Unsteadiness on feet (R26.81)    Time: 1359-1411 PT Time Calculation (min) (ACUTE ONLY): 12 min   Charges:   PT Evaluation $PT Eval Low Complexity: 1 Low   PT General Charges $$ ACUTE PT VISIT: 1 Visit         Aleda Grana, PT, DPT 10/06/22, 2:22 PM   Sandi Mariscal 10/06/2022, 2:21 PM

## 2022-10-06 NOTE — Progress Notes (Signed)
   Heart Failure Stewardship Pharmacist Progress Note   PCP: Merri Brunette, MD PCP-Cardiologist: None    HPI:  77 yo F with PMH of CHF, afib/flutter, HTN, T2DM, CKD IIIb, HLD, CAD, SSS s/p PPM, OSA on BiPAP, valvular cancer s/p resection and radiation, and hypothyroidism.  Presented to the ED on 9/21 with chest pain, fatigue, and shortness of breath. She was recently taken off hydrochlorothiazide by her PCP and felt she has been retaining fluid since. CXR without acute abnormalities, BNP 778. ECHO 9/22 showed LVEF 65-70% (was 55% in 2022), no RWMA, moderate LVH, RV normal, mild MR.   Current HF Medications: Diuretic: torsemide 20 mg daily Beta Blocker: metoprolol tartrate 50 mg BID ACE/ARB/ARNI: irbesartan 300 mg daily MRA: spironolactone 25 mg daily SGLT2i: Farxiga 10 mg daily  Prior to admission HF Medications: Diuretic: torsemide 10 mg daily PRN Beta blocker: metoprolol tartrate 50 mg BID ACE/ARB/ARNI: olmesartan 40 mg daily MRA: spironolactone 25 mg daily SGLT2i: Farxiga 10 mg daily  Pertinent Lab Values: Serum creatinine 1.72, BUN 26, Potassium 4.4, Sodium 133, BNP 778.2, Magnesium 2.5, A1c 5.4   Vital Signs: Weight: 175 lbs (admission weight: 174 lbs) Blood pressure: 110/50s  Heart rate: 60s  I/O: net -2.1L yesterday; net -3.8L since admission  Medication Assistance / Insurance Benefits Check: Does the patient have prescription insurance?  Yes Type of insurance plan: [employer] Medicare  Outpatient Pharmacy:  Prior to admission outpatient pharmacy: [facility] Is the patient willing to use [facility] T[facility] pharmacy at discharge? Yes Is the patient willing to transition their outpatient pharmacy to utilize a [facility] outpatient pharmacy?   No    Assessment: 1. Acute on chronic diastolic CHF (LVEF 65-70%). NYHA class II symptoms. - Off IV lasix. Agree with switching to torsemide 20 mg daily. Agree with higher dosing with recently stopping hydrochlorothiazide. Strict I/Os  and daily weights. Keep K>4 and Mg>2. - Continue metoprolol tartrate 50 mg BID - Continue irbesartan 300 mg daily (olmesartan PTA) - Continue spironolactone 25 mg daily - Continue Farxiga 10 mg daily   Plan: 1) Medication changes recommended at this time: - Agree with switching to torsemide 20 mg daily  2) Patient assistance: - None pending  3)  Education  - Patient has been educated on current HF medications and potential additions to HF medication regimen - Patient verbalizes understanding that over the next few months, these medication doses may change and more medications may be added to optimize HF regimen - Patient has been educated on basic disease state pathophysiology and goals of therapy   Sharen Hones, PharmD, BCPS Heart Failure Stewardship Pharmacist Phone [phone]

## 2022-10-06 NOTE — Evaluation (Signed)
Occupational Therapy Evaluation Patient Details Name: [name] MRN: [id number] DOB: Sep 20, 1945 Today's Date: 10/06/2022   History of Present Illness Pt is a 77 y/o F presenting to ED On 9/21 with chest pain and dyspnea, admittedf or acute on chronic diastolic CHF. PMH includes CHF, A fib, HTN, DM2, CKD, dyslipidemia, CAD, sick sinus syndrome s/p pacemaker implantation, obesity, OSA   Clinical Impression   Pt reports ind at baseline with ADLs/functional mobility, lives with daughter who can provide limited assist. Pt currently needing set up -min A for ADLs, CGA for mobility in room without AD. Pt reports feeling mildly weaker than normal but is close to baseline, educated on energy conservation strategies for home and pt verbalized understanding. Pt presenting with impairments listed below, will follow acutely. Recommend HHOT at d/c.        If plan is discharge home, recommend the following: A little help with walking and/or transfers;A little help with bathing/dressing/bathroom;Assistance with cooking/housework;Assist for transportation;Help with stairs or ramp for entrance    Functional Status Assessment  Patient has had a recent decline in their functional status and demonstrates the ability to make significant improvements in function in a reasonable and predictable amount of time.  Equipment Recommendations  None recommended by OT    Recommendations for Other Services PT consult     Precautions / Restrictions Precautions Precautions: None Restrictions Weight Bearing Restrictions: No      Mobility Bed Mobility               General bed mobility comments: OOB in chair upon arrival and departure    Transfers Overall transfer level: Needs assistance Equipment used: None Transfers: Sit to/from Stand Sit to Stand: Contact guard assist                  Balance Overall balance assessment: Mild deficits observed, not formally tested                                          ADL either performed or assessed with clinical judgement   ADL Overall ADL's : Needs assistance/impaired Eating/Feeding: Set up;Sitting   Grooming: Set up;Sitting   Upper Body Bathing: Minimal assistance;Sitting   Lower Body Bathing: Sitting/lateral leans;Minimal assistance   Upper Body Dressing : Minimal assistance;Sitting   Lower Body Dressing: Sitting/lateral leans;Minimal assistance   Toilet Transfer: Contact guard assist   Toileting- Clothing Manipulation and Hygiene: Supervision/safety       Functional mobility during ADLs: Contact guard assist;Rolling walker (2 wheels)       Vision Baseline Vision/History: 1 Wears glasses Vision Assessment?: No apparent visual deficits     Perception Perception: Not tested       Praxis Praxis: Not tested       Pertinent Vitals/Pain Pain Assessment Pain Assessment: No/denies pain     Extremity/Trunk Assessment Upper Extremity Assessment Upper Extremity Assessment: Generalized weakness   Lower Extremity Assessment Lower Extremity Assessment: Defer to PT evaluation   Cervical / Trunk Assessment Cervical / Trunk Assessment: Normal   Communication Communication Communication: No apparent difficulties   Cognition Arousal: Alert Behavior During Therapy: WFL for tasks assessed/performed Overall Cognitive Status: Within Functional Limits for tasks assessed  General Comments  VSS on RA    Exercises     Shoulder Instructions      Home Living Family/patient expects to be discharged to:: Private residence Living Arrangements: Children (daughter) Available Help at Discharge: Family;Available 24 hours/day (daughter has had a stroke, states that they both help eachother) Type of Home: House Home Access: Stairs to enter [employer] of Steps: 1   Home Layout: One level     Bathroom Shower/Tub: Tub/shower unit          Home Equipment: Cane - single point;BSC/3in1;Shower seat          Prior Functioning/Environment Prior Level of Function : Independent/Modified Independent;Driving             Mobility Comments: no AD use ADLs Comments: ind        OT Problem List: Decreased range of motion;Decreased strength;Decreased activity tolerance;Impaired balance (sitting and/or standing)      OT Treatment/Interventions: Self-care/ADL training;Therapeutic exercise;Energy conservation;DME and/or AE instruction;Therapeutic activities;Patient/family education;Balance training    OT Goals(Current goals can be found in the care plan section) Acute Rehab OT Goals Patient Stated Goal: none stated OT Goal Formulation: With patient Time For Goal Achievement: 10/20/22 Potential to Achieve Goals: Good  OT Frequency: Min 1X/week    Co-evaluation              AM-PAC OT "6 Clicks" Daily Activity     Outcome Measure Help from another person eating meals?: None Help from another person taking care of personal grooming?: A Little Help from another person toileting, which includes using toliet, bedpan, or urinal?: A Little Help from another person bathing (including washing, rinsing, drying)?: A Little Help from another person to put on and taking off regular upper body clothing?: A Little Help from another person to put on and taking off regular lower body clothing?: A Little 6 Click Score: 19   End of Session Nurse Communication: Mobility status  Activity Tolerance: Patient tolerated treatment well Patient left: in chair;with call bell/phone within reach  OT Visit Diagnosis: Unsteadiness on feet (R26.81);Other abnormalities of gait and mobility (R26.89);Muscle weakness (generalized) (M62.81)                Time: 1253-1310 OT Time Calculation (min): 17 min Charges:  OT General Charges $OT Visit: 1 Visit OT Evaluation $OT Eval Moderate Complexity: 1 Mod  Markos Theil K, OTD, OTR/L SecureChat  Preferred Acute Rehab [phone]   Philbert Ocallaghan K Koonce 10/06/2022, 1:51 PM

## 2022-10-06 NOTE — Progress Notes (Signed)
Heart Failure Nurse Navigator Progress Note  PCP: Merri Brunette, MD PCP-Cardiologist: [facility] Admission Diagnosis: Acute on chronic congestive heart failure.  Admitted from: Home  Presentation:   [name] presented with non radiating mid sternal chest pain, fatigue, shortness of breath x 4 days. Patient reported to be taken off her hydrochlorothiazide about 1 week ago by her PCP, BP 189/59, HR 61, BNP 778.2, CXR without acute abnormalities.   Patient was educated on the sign and symptoms of heart failure, daily weights, when to call her doctor or go to the ED, Diet/ fluid restrictions, taking all medications as prescribed, and attending all medical appointments. Patient verbalized her understanding, a HF TOC per Dr Ella Jubilee was scheduled for 10/21/2022 @ 11 am.   ECHO/ LVEF: 65-70%  Clinical Course:  Past Medical History:  Diagnosis Date   Arthritis    CAD (coronary artery disease)    Diabetes mellitus without complication (HCC)    Diastolic heart failure (HCC)    Dyspnea    Encounter for care of pacemaker 09/03/2018   Family history of kidney cancer    Family history of throat cancer    Family history of uterine cancer    History of chickenpox    History of radiation therapy    Pelvis 10/23/20-12/14/20- Dr. Antony Blackbird   Hx of psoriatic arthritis    Hyperlipemia    Hypertension    Hypertension 05/03/2018   Hypothyroidism    Mitral valve disorders(424.0)    Myocardial infarction (HCC)    Obstructive sleep apnea    OSA (obstructive sleep apnea)    using BiPAP   Pacemaker    Paroxysmal atrial fibrillation (HCC) 10/11/2016   Peripheral neuropathy    Renal disorder Kidney disease stage2   Sick sinus syndrome [facility])    S/p dual-chamber pacemaker   Thyroid disease hypothyroid   Urine incontinence    Vitamin D deficiency    Vulvar cancer (HCC) 2022   s/p surgery and radiation therapy October-December 2022     Social History   Socioeconomic History    Marital status: Widowed    Spouse name: Not on file   Number of children: 3   Years of education: Not on file   Highest education level: Not on file  Occupational History   Not on file  Tobacco Use   Smoking status: Never   Smokeless tobacco: Never  Vaping Use   Vaping status: Never Used  Substance and Sexual Activity   Alcohol use: No    Alcohol/week: 0.0 standard drinks of alcohol   Drug use: No   Sexual activity: Not Currently    Birth control/protection: Post-menopausal  Other Topics Concern   Not on file  Social History Narrative   Husband recently passed away on 05/14/2020.   Social Determinants of Health   Financial Resource Strain: Low Risk  (08/23/2020)   Received from [facility], [facility]   Overall Financial Resource Strain (CARDIA)    Difficulty of Paying Living Expenses: Not hard at all  Food Insecurity: No Food Insecurity (10/04/2022)   Hunger Vital Sign    Worried About Running Out of Food in the Last Year: Never true    Ran Out of Food in the Last Year: Never true  Transportation Needs: No Transportation Needs (10/04/2022)   PRAPARE - Administrator, Civil Service (Medical): No    Lack of Transportation (Non-Medical): No  Physical Activity: Not on file  Stress: Not  on file  Social Connections: Not on file   Education Assessment and Provision:  Detailed education and instructions provided on heart failure disease management including the following:  Signs and symptoms of Heart Failure When to call the physician Importance of daily weights Low sodium diet Fluid restriction Medication management Anticipated future follow-up appointments  Patient education given on each of the above topics.  Patient acknowledges understanding via teach back method and acceptance of all instructions.  Education Materials:  "Living Better With Heart Failure" Booklet, HF zone tool, & Daily Weight Tracker Tool.  Patient has scale at home:  yes Patient has pill box at home: yes    High Risk Criteria for Readmission and/or Poor Patient Outcomes: Heart failure hospital admissions (last 6 months): 1  No Show rate: 1 % Difficult social situation: No Demonstrates medication adherence: Yes Primary Language: English Literacy level: Reading, writing, and comprehension  Barriers of Care:   Diet/ fluid restrictions Daily weights  Considerations/Referrals:   Referral made to Heart Failure Pharmacist Stewardship: Yes Referral made to Heart Failure CSW/NCM TOC: No Referral made to Heart & Vascular TOC clinic: Yes, per [facility] 10/21/2022 @ 11 am  Items for Follow-up on DC/TOC: Diet/ fluid restrictions Daily weights   Rhae Hammock, BSN, RN Heart Failure Teacher, adult education Only

## 2022-10-06 NOTE — Plan of Care (Signed)

## 2022-10-06 NOTE — Progress Notes (Signed)
Progress Note   Patient: [name] ION:[id number] DOB: March 24, 1945 DOA: 10/04/2022     1 DOS: the patient was seen and examined on 10/06/2022   Brief hospital course: [name] was admitted to the hospital with the working diagnosis of heart failure decompensation.   77 yo female with the past medical history of heart failure, atrial fibrillation, hypertension, T2DM, CKD, dyslipidemia, coronary artery disease, sick sinus syndrome sp pacemaker implantation, obesity and OSA who presented with chest pain and dyspnea. Endorsed 5 days of worsening edema, dyspnea, and chest pain. Worsening symptoms with exertion. Pressure type chest pain. She was recently taken off hydrochlorothiazide due to hypercalcemia. On her initial physical examination her blood pressure was 166/74, HR 78, RR 19 and 02 saturation 96%, lungs with bilateral rales with no wheezing, heart with S1 and S2 present and regular, positive JVD, abdomen distended and no lower extremity edema.   Na 137, K 4,1 Cl 100, bicarbonate 28, glucose 223, bun 19 cr 1,37  BNP 778  High sensitive troponin 19 and 20 Wbc 5,4 hgb 11,8 plt 168   Chest radiograph with no cardiomegaly, bilateral hilar vascular congestion with cephalization of the vasculature. Pacemaker in place with one right atrial and one right ventricular lead.   EKG 71 bpm, left axis deviation, normal intervals, atrial pacing with 1st degree AV block, ventricular sensing, q waves V1 and V2, no significant ST segment or T wave changes.   09/22 patient had diuresis with improvement in her volume status.  Plan for possible discharge home tomorrow.   Assessment and Plan: * Acute on chronic diastolic CHF (congestive heart failure) (HCC) Echocardiogram with preserved LV systolic function with EF 65 to 70%, moderate LVH, RV with preserved systolic function, LA with mild dilatation, mild MR,   Urine output documented 3,350 cc Systolic blood pressure 117 to 119 mmHg.   Continue with  irbesartan, metoprolol, isosorbide, spironolactone and dapagliflozin.  Transition to oral loop diuretic therapy tomorrow   Acute hypoxemic respiratory failure due to acute cardiogenic pulmonary edema.  02 saturation today is 93% on on room air.  Patient has supplemental 02 at home.   Stage 3b chronic kidney disease (CKD) (HCC) Hyponatremia.   Renal function with serum cr at 1,72 with K at 4,4 and serum bicarbonate at 30. Na 133 Mg 2.5   Transition to oral furosemide tomorrow.  Follow up renal function and electrolytes in am.   CAD (coronary artery disease) No acute coronary syndrome.  Continue blood pressure control.  Continue with fenofibrate.   Primary hypertension Blood pressure has improved, continue with metoprolol, irebesartan and isosorbide.  Diuretic therapy with spironolactone. Resume oral loop diuretic tomorrow.   Paroxysmal atrial fibrillation (HCC) Continue amiodarone for rate and rhythm control. Continue anticoagulation with apixaban. Continue telemetry monitoring.   Diabetes mellitus without complication (HCC) Plan to continue insulin therapy, sliding scale and basal for glucose control and monitoring.   Complex sleep apnea syndrome - BiPAP at bedtime with 4 L bleed in  Vulvar cancer [facility]) History of vulvar cancer s/p radiation and surgery in 2022.  Continues to follow with Gyn-onc.  Hypothyroidism Continue with levothyroxine.   Chronic diarrhea Patient states she has a history of chronic diarrhea that has been persistent and seems to have started around the time she had radiation for vulvar cancer. In 2023, she had a colonoscopy that was negative for proctitis.  GI infectious panel was negative given patient's history of long-term diabetes, she is certainly at risk for exocrine pancreatic  insufficiency.   - Continue home Imodium as needed        Subjective: Patient with dizziness today, not feeling well, dyspnea and abdominal distention have  improved, no chest pain   Physical Exam: Vitals:   10/05/22 2200 10/05/22 2356 10/06/22 0356 10/06/22 1039  BP: (!) 140/51 (!) 143/42 (!) 117/50 (!) 119/49  Pulse: 65 62 60 61  Resp:  15 17   Temp:  97.6 F (36.4 C) 97.8 F (36.6 C)   TempSrc:  Oral Axillary   SpO2:  93% 95%   Weight:   79.5 kg   Height:       Neurology awake and alert ENT with mild pallor Cardiovascular with S1 and S2 present and regular with no gallops, rubs or murmurs No JVD No abdominal wall edema No lower extremity edema Respiratory with no rales or wheezing, no rhonchi Abdomen with no distention  Data Reviewed:    Family Communication: no family at the bedside   Disposition: Status is: Inpatient Remains inpatient appropriate because: heart failure, possible discharge tomorrow   Planned Discharge Destination: Home     Author: Coralie Keens, MD 10/06/2022 12:02 PM  For on call review [email or web address].

## 2022-10-06 NOTE — Progress Notes (Signed)
Added 4L O2 in line per home settings.   10/05/22 2300  BiPAP/CPAP/SIPAP  BiPAP/CPAP/SIPAP Pt Type Adult  Flow Rate 4 lpm  Patient Home Equipment Yes

## 2022-10-07 DIAGNOSIS — I5033 Acute on chronic diastolic (congestive) heart failure: Secondary | ICD-10-CM | POA: Diagnosis not present

## 2022-10-07 DIAGNOSIS — I480 Paroxysmal atrial fibrillation: Secondary | ICD-10-CM | POA: Diagnosis not present

## 2022-10-07 DIAGNOSIS — I2510 Atherosclerotic heart disease of native coronary artery without angina pectoris: Secondary | ICD-10-CM | POA: Diagnosis not present

## 2022-10-07 DIAGNOSIS — N1832 Chronic kidney disease, stage 3b: Secondary | ICD-10-CM | POA: Diagnosis not present

## 2022-10-07 LAB — GLUCOSE, CAPILLARY
Glucose-Capillary: 166 mg/dL — ABNORMAL HIGH (ref 70–99)
Glucose-Capillary: 173 mg/dL — ABNORMAL HIGH (ref 70–99)
Glucose-Capillary: 165 mg/dL — ABNORMAL HIGH (ref 70–99)
Glucose-Capillary: 161 mg/dL — ABNORMAL HIGH (ref 70–99)

## 2022-10-07 LAB — RENAL FUNCTION PANEL
Albumin: 2.6 g/dL — ABNORMAL LOW (ref 3.5–5.0)
Anion gap: 11 (ref 5–15)
BUN: 41 mg/dL — ABNORMAL HIGH (ref 8–23)
CO2: 25 mmol/L (ref 22–32)
Calcium: 9.7 mg/dL (ref 8.9–10.3)
Chloride: 91 mmol/L — ABNORMAL LOW (ref 98–111)
Creatinine, Ser: 2.36 mg/dL — ABNORMAL HIGH (ref 0.44–1.00)
GFR, Estimated: 20 mL/min — ABNORMAL LOW (ref >60)
Glucose, Bld: 199 mg/dL — ABNORMAL HIGH (ref 70–99)
Phosphorus: 4.1 mg/dL (ref 2.5–4.6)
Potassium: 4.3 mmol/L (ref 3.5–5.1)
Sodium: 127 mmol/L — ABNORMAL LOW (ref 135–145)

## 2022-10-07 LAB — MAGNESIUM: Magnesium: 2.4 mg/dL (ref 1.7–2.4)

## 2022-10-07 NOTE — Plan of Care (Signed)
Problem: Coping: Goal: Ability to adjust to condition or change in health will improve Outcome: Progressing   Problem: Education: Goal: Knowledge of General Education information will improve Description: Including pain rating scale, medication(s)/side effects and non-pharmacologic comfort measures Outcome: Progressing   Problem: Activity: Goal: Risk for activity intolerance will decrease Outcome: Progressing

## 2022-10-07 NOTE — Plan of Care (Signed)
  Problem: Coping: Goal: Ability to adjust to condition or change in health will improve Outcome: Progressing   Problem: Education: Goal: Knowledge of General Education information will improve Description: Including pain rating scale, medication(s)/side effects and non-pharmacologic comfort measures Outcome: Progressing   Problem: Activity: Goal: Risk for activity intolerance will decrease 10/07/2022 0608 by Carolanne Grumbling, RN Outcome: Progressing 10/07/2022 0605 by Carolanne Grumbling, RN Outcome: Progressing   Problem: Coping: Goal: Level of anxiety will decrease Outcome: Progressing   Problem: Safety: Goal: Ability to remain free from injury will improve Outcome: Progressing

## 2022-10-07 NOTE — Progress Notes (Signed)
   Heart Failure Stewardship Pharmacist Progress Note   PCP: Merri Brunette, MD PCP-Cardiologist: None    HPI:  77 yo F with PMH of CHF, afib/flutter, HTN, T2DM, CKD IIIb, HLD, CAD, SSS s/p PPM, OSA on BiPAP, valvular cancer s/p resection and radiation, and hypothyroidism.  Presented to the ED on 9/21 with chest pain, fatigue, and shortness of breath. She was recently taken off hydrochlorothiazide by her PCP and felt she has been retaining fluid since. CXR without acute abnormalities, BNP 778. ECHO 9/22 showed LVEF 65-70% (was 55% in 2022), no RWMA, moderate LVH, RV normal, mild MR.   Current HF Medications: Beta Blocker: metoprolol tartrate 50 mg BID ACE/ARB/ARNI: irbesartan 300 mg daily MRA: spironolactone 25 mg daily SGLT2i: Farxiga 10 mg daily  Prior to admission HF Medications: Diuretic: torsemide 10 mg daily PRN Beta blocker: metoprolol tartrate 50 mg BID ACE/ARB/ARNI: olmesartan 40 mg daily MRA: spironolactone 25 mg daily SGLT2i: Farxiga 10 mg daily  Pertinent Lab Values: Serum creatinine 2.36, BUN 41, Potassium 4.3, Sodium 127, BNP 778.2, Magnesium 2.4, A1c 5.4   Vital Signs: Weight: 175 lbs (admission weight: 174 lbs) Blood pressure: 120/50s  Heart rate: 60s  I/O: net -0.8L yesterday; net -3.3L since admission  Medication Assistance / Insurance Benefits Check: Does the patient have prescription insurance?  Yes Type of insurance plan: [employer] Medicare  Outpatient Pharmacy:  Prior to admission outpatient pharmacy: [facility] Is the patient willing to use [facility] TOC pharmacy at discharge? Yes Is the patient willing to transition their outpatient pharmacy to utilize a [facility] outpatient pharmacy?   No    Assessment: 1. Acute on chronic diastolic CHF (LVEF 65-70%). NYHA class II symptoms. - Holding torsemide with AKI. Will likely need higher dosing at discharge with recently stopping hydrochlorothiazide. Hopefully can resume at 20 mg daily pending renal function.  Strict I/Os and daily weights. Keep K>4 and Mg>2. - Continue metoprolol tartrate 50 mg BID - Continue irbesartan 300 mg daily (olmesartan PTA) - Continue spironolactone 25 mg daily - Continue Farxiga 10 mg daily   Plan: 1) Medication changes recommended at this time: - Agree with changes, can hopefully resume torsemide 20 mg daily pending renal function  2) Patient assistance: - None pending  3)  Education  - Patient has been educated on current HF medications and potential additions to HF medication regimen - Patient verbalizes understanding that over the next few months, these medication doses may change and more medications may be added to optimize HF regimen - Patient has been educated on basic disease state pathophysiology and goals of therapy   Sharen Hones, PharmD, BCPS Heart Failure Stewardship Pharmacist Phone [phone]

## 2022-10-07 NOTE — Plan of Care (Signed)
  Problem: Coping: Goal: Ability to adjust to condition or change in health will improve Outcome: Progressing   Problem: Education: Goal: Knowledge of General Education information will improve Description: Including pain rating scale, medication(s)/side effects and non-pharmacologic comfort measures Outcome: Progressing   Problem: Clinical Measurements: Goal: Respiratory complications will improve Outcome: Progressing   Problem: Activity: Goal: Risk for activity intolerance will decrease Outcome: Progressing

## 2022-10-07 NOTE — Progress Notes (Incomplete)
PROGRESS NOTE    [name]  EXB:[id number] DOB: 1945/07/24 DOA: 10/04/2022 PCP: Merri Brunette, MD  77/F w  diastolic heart failure, P.Afib, hypertension, T2DM, CKD, dyslipidemia, CAD, sick sinus syndrome sp pacemaker implantation, obesity and OSA presented with chest pain and dyspnea In ED,  cr 1,37, BNP 778, troponin 19 and 20 Cxr w/bilateral hilar vascular congestion with cephalization of the vasculature -Admitted, started on diuretics, complicated by worsening AKI and hyponatremia   Subjective: Feels weak and tired, breathing better  Assessment and Plan:  Acute on chronic diastolic CHF (congestive heart failure) (HCC) -Echo with EF 65 to 70%, moderate LVH, RV preserved -Volume status has improved, 3.6 L negative -Creatinine up to 2.3 yesterday from baseline of 1.3, hold ARB, diuretics today, volume status is improved -Continue Aldactone, Farxiga, metoprolol, Imdur -Repeat sodium this afternoon and in a.m.  Acute hypoxemic respiratory failure due to acute cardiogenic pulmonary edema.  -Resolved, weaned off O2  AKI on stage 3b chronic kidney disease (CKD) (HCC) Hyponatremia.  -Baseline creatinine around 1.3, trended up to 2.36 yesterday with worsening hyponatremia -?  Overdiuresis  -Hold Lasix and ARB today, repeat labs  CAD (coronary artery disease) No ACS Continue with fenofibrate.   Primary hypertension -Meds as above, holding ARB  Paroxysmal atrial fibrillation (HCC) Continue amiodarone, apixaban  Diabetes mellitus without complication (HCC) -Stable, continue glargine, Farxiga and sliding scale  Complex sleep apnea syndrome - BiPAP at bedtime with 4 L bleed in  Vulvar cancer (HCC) History of vulvar cancer s/p radiation and surgery in 2022.  Continues to follow with Gyn-onc.  Hypothyroidism Continue with levothyroxine.   Chronic diarrhea Patient states she has a history of chronic diarrhea that has been persistent and seems to have started around the  time she had radiation for vulvar cancer. In 2023, she had a colonoscopy that was negative for proctitis.  GI infectious panel was negative given patient's history of long-term diabetes, she is certainly at risk for exocrine pancreatic insufficiency.  - Continue home Imodium as needed   DVT prophylaxis: apixaban Code Status: Full Code Family Communication: No family at bedside Disposition Plan: Home tomorrow if sodium, creatinine stable  Consultants:    Procedures:   Antimicrobials:    Objective: Vitals:   10/07/22 0000 10/07/22 0406 10/07/22 0748 10/07/22 1610  BP: (!) 124/46 (!) 129/55 (!) 129/50 (!) 126/51  Pulse: 64 80 67 60  Resp: 18 18 18 18   Temp: 98 F (36.7 C) 98.2 F (36.8 C) 98.9 F (37.2 C) 98.9 F (37.2 C)  TempSrc: Oral Oral Oral Oral  SpO2: 99% 91% 91%   Weight:  79.6 kg    Height:        Intake/Output Summary (Last 24 hours) at 10/07/2022 1657 Last data filed at 10/07/2022 1200 Gross per 24 hour  Intake 360 ml  Output 200 ml  Net 160 ml   Filed Weights   10/05/22 0434 10/06/22 0356 10/07/22 0406  Weight: 79 kg 79.5 kg 79.6 kg    Examination:  Gen: Awake, Alert, Oriented X 3, obese, sitting up in chair chair HEENT: no JVD Lungs: Good air movement bilaterally, CTAB CVS: S1S2/RRR Abd: soft, Non tender, mildly distended, BS present Extremities: No edema Skin: no new rashes on exposed skin     Data Reviewed:   CBC: Recent Labs  Lab 10/04/22 1229  WBC 5.4  HGB 11.8*  HCT 37.5  MCV 97.9  PLT 168   Basic Metabolic Panel: Recent Labs  Lab 10/04/22 1229 10/05/22  0245 10/06/22 0255 10/07/22 0240  NA 137 135 133* 127*  K 4.1 3.6 4.4 4.3  CL 100 95* 91* 91*  CO2 28 28 30 25   GLUCOSE 223* 139* 166* 199*  BUN 19 19 26* 41*  CREATININE 1.37* 1.37* 1.72* 2.36*  CALCIUM 9.9 10.3 11.3* 9.7  MG  --  1.7 2.5* 2.4  PHOS  --   --   --  4.1   GFR: Estimated Creatinine Clearance: 18.9 mL/min (A) (by C-G formula based on SCr of 2.36 mg/dL  (H)). Liver Function Tests: Recent Labs  Lab 10/07/22 0240  ALBUMIN 2.6*   No results for input(s): "LIPASE", "AMYLASE" in the last 168 hours. No results for input(s): "AMMONIA" in the last 168 hours. Coagulation Profile: No results for input(s): "INR", "PROTIME" in the last 168 hours. Cardiac Enzymes: No results for input(s): "CKTOTAL", "CKMB", "CKMBINDEX", "TROPONINI" in the last 168 hours. BNP (last 3 results) No results for input(s): "PROBNP" in the last 8760 hours. HbA1C: No results for input(s): "HGBA1C" in the last 72 hours. CBG: Recent Labs  Lab 10/06/22 1645 10/06/22 2056 10/07/22 0602 10/07/22 1145 10/07/22 1607  GLUCAP 176* 227* 166* 173* 165*   Lipid Profile: No results for input(s): "CHOL", "HDL", "LDLCALC", "TRIG", "CHOLHDL", "LDLDIRECT" in the last 72 hours. Thyroid Function Tests: No results for input(s): "TSH", "T4TOTAL", "FREET4", "T3FREE", "THYROIDAB" in the last 72 hours. Anemia Panel: No results for input(s): "VITAMINB12", "FOLATE", "FERRITIN", "TIBC", "IRON", "RETICCTPCT" in the last 72 hours. Urine analysis:    Component Value Date/Time   COLORURINE YELLOW 10/08/2016 1441   APPEARANCEUR Clear 12/24/2016 1500   LABSPEC <=1.005 (A) 10/08/2016 1441   PHURINE 6.0 10/08/2016 1441   GLUCOSEU Negative 12/24/2016 1500   GLUCOSEU NEGATIVE 10/08/2016 1441   HGBUR NEGATIVE 10/08/2016 1441   BILIRUBINUR Negative 12/24/2016 1500   KETONESUR NEGATIVE 10/08/2016 1441   PROTEINUR neg 12/29/2016 1347   PROTEINUR Negative 12/24/2016 1500   PROTEINUR NEGATIVE 04/29/2015 1930   UROBILINOGEN 0.2 11/25/2016 1431   UROBILINOGEN 0.2 10/08/2016 1441   NITRITE neg 12/29/2016 1347   NITRITE Negative 12/24/2016 1500   NITRITE NEGATIVE 10/08/2016 1441   LEUKOCYTESUR Negative 12/29/2016 1347   LEUKOCYTESUR Negative 12/24/2016 1500   Sepsis Labs: @LABRCNTIP (procalcitonin:4,lacticidven:4)  )No results found for this or any previous visit (from the past 240 hour(s)).    Radiology Studies: No results found.   Scheduled Meds:  amiodarone  100 mg Oral Daily   apixaban  5 mg Oral BID   cholecalciferol  1,000 Units Oral q AM   dapagliflozin propanediol  10 mg Oral Daily   fenofibrate  160 mg Oral Q supper   insulin aspart  0-15 Units Subcutaneous TID WC   insulin glargine-yfgn  10 Units Subcutaneous QHS   irbesartan  300 mg Oral Daily   isosorbide mononitrate  60 mg Oral Q supper   levothyroxine  137 mcg Oral Q0600   metoprolol tartrate  50 mg Oral BID   sodium chloride flush  3 mL Intravenous Q12H   spironolactone  25 mg Oral Daily   Continuous Infusions:  sodium chloride       LOS: 2 days    Time spent:    Zannie Cove, MD [facility]   10/07/2022, 4:57 PM

## 2022-10-07 NOTE — TOC Initial Note (Signed)
Transition of Care [facility]) - Initial/Assessment Note    Patient Details  Name: [name] MRN: [id number] Date of Birth: Apr 14, 1945  Transition of Care [facility]) CM/SW Contact:    Leone Haven, RN Phone Number: 10/07/2022, 4:29 PM  Clinical Narrative:                 From home with daughter, has PCP and insurance on file, states has no HH services in place at this time , she has all the DME she needs from her spouse.  States family member will transport her home at [employer] and family is support system, states gets medications from [place] but would like to get medications from [place] pharmacy since it is closer to her.   Pta self ambulatory.   Expected Discharge Plan: Home w Home Health Services Barriers to Discharge: Continued Medical Work up   Patient Goals and CMS Choice Patient states their goals for this hospitalization and ongoing recovery are:: return home   Choice offered to / list presented to : NA      Expected Discharge Plan and Services In-house Referral: NA Discharge Planning Services: CM Consult Post Acute Care Choice: NA Living arrangements for the past 2 months: Single Family Home                 DME Arranged: N/A DME Agency: NA       HH Arranged: NA          Prior Living Arrangements/Services Living arrangements for the past 2 months: Single Family Home Lives with:: Adult Children (daughter) Patient language and need for interpreter reviewed:: Yes Do you feel safe going back to the place where you live?: Yes      Need for Family Participation in Patient Care: Yes (Comment) Care giver support system in place?: Yes (comment) Current home services: DME (walker, cane, bsc, shower chair,) Criminal Activity/Legal Involvement Pertinent to Current Situation/Hospitalization: No - Comment as needed  Activities of Daily Living Home Assistive Devices/Equipment: None ADL Screening (condition at time of admission) Is the patient deaf or have difficulty  hearing?: No Does the patient have difficulty seeing, even when wearing glasses/contacts?: No Does the patient have difficulty concentrating, remembering, or making decisions?: No  Permission Sought/Granted Permission sought to share information with : Case Manager Permission granted to share information with : Yes, Verbal Permission Granted              Emotional Assessment   Attitude/Demeanor/Rapport: Engaged Affect (typically observed): Appropriate Orientation: : Oriented to Self, Oriented to Place, Oriented to  Time, Oriented to Situation Alcohol / Substance Use: Not Applicable Psych Involvement: No (comment)  Admission diagnosis:  Acute on chronic heart failure with preserved ejection fraction (HFpEF) (HCC) [I50.33] Heart failure (HCC) [I50.9] Patient Active Problem List   Diagnosis Date Noted   Heart failure (HCC) 10/05/2022   Stage 3b chronic kidney disease (CKD) (HCC) 10/04/2022   Acute hypoxic respiratory failure (HCC) 10/04/2022   Hypothyroidism 10/04/2022   Chronic diarrhea 10/04/2022   Primary hypertension 07/31/2022   Incontinence of feces 10/14/2021   Loose stools 10/14/2021   Colon cancer screening 10/14/2021   Genetic testing 09/06/2020   Family history of uterine cancer 08/10/2020   Family history of throat cancer 08/10/2020   Family history of kidney cancer 08/10/2020   Vulvar cancer (HCC) 07/20/2020   Family history of pancreatic cancer 07/20/2020   Family history of ovarian cancer 07/20/2020   Atypical atrial flutter (HCC) 09/07/2018   Encounter for  care of pacemaker 09/03/2018   Dyspnea on exertion 08/28/2017   Acute on chronic diastolic CHF (congestive heart failure) (HCC) 06/08/2017   Lichen planus atrophicus 12/16/2016   Vulvovaginal candidiasis 12/16/2016   Itching in the vaginal area 10/11/2016   Pruritus 10/11/2016   Paroxysmal atrial fibrillation (HCC) 10/11/2016   Pseudophakia 03/11/2016   Pacemaker  Medtronic  Medtronic Adapta Dr ADDRL1,  leads only MRI  in situ 04/30/2015 04/30/2015   Sinus node dysfunction (HCC) 04/30/2015   Complex sleep apnea syndrome 04/29/2014   CAD (coronary artery disease) 04/29/2014   Moderate mitral regurgitation 04/29/2014   Diabetes mellitus without complication (HCC) 06/02/2012   Chronic diastolic heart failure (HCC) 05/30/2012   OBESITY 08/02/2008   DIVERTICULOSIS OF COLON 08/02/2008   PCP:  Merri Brunette, MD Pharmacy:   [facility] [address], [place] - [address] [place] [address] Phone: [phone] Fax: [phone]  [facility] [place], [place] - [address] [place] [address] Phone: [phone] Fax: [phone]  [facility] [address] [place] [address] Phone: [phone] Fax: [phone]     Social Determinants of Health (SDOH) Social History: SDOH Screenings   Food Insecurity: No Food Insecurity (10/04/2022)  Housing: Low Risk  (10/06/2022)  Transportation Needs: No Transportation Needs (10/06/2022)  Utilities: Not At Risk (10/04/2022)  Alcohol Screen: Low Risk  (10/06/2022)  Financial Resource Strain: Low Risk  (10/06/2022)  Tobacco Use: Low Risk  (10/04/2022)   SDOH Interventions: Housing Interventions: Intervention Not Indicated Transportation Interventions: Intervention Not Indicated Alcohol Usage Interventions: Intervention Not Indicated (Score <7) Financial Strain Interventions: Intervention Not Indicated   Readmission Risk Interventions     No data to display

## 2022-10-07 NOTE — Progress Notes (Signed)
Mobility Specialist Progress Note:    10/07/22 1212  Mobility  Activity Ambulated with assistance in hallway  Level of Assistance Contact guard assist, steadying assist  Assistive Device Front wheel walker  Distance Ambulated (ft) 200 ft  Activity Response Tolerated well  Mobility Referral Yes  $Mobility charge 1 Mobility  Mobility Specialist Start Time (ACUTE ONLY) 1150  Mobility Specialist Stop Time (ACUTE ONLY) 1205  Mobility Specialist Time Calculation (min) (ACUTE ONLY) 15 min   Pt received supine in bed agreeable to mobility. Pt states she was feeling really weak today but motivated to move. Pt needed no physical assistance throughout session. Returned to room w/o fault. Left seated EOB. All needs met w/ call bell and personal belongings in reach.   Thompson Grayer Mobility Specialist  Please contact vis Secure Chat or  Rehab Office [phone]

## 2022-10-07 NOTE — Progress Notes (Signed)
Progress Note   Patient: [name] UYQ:[id number] DOB: 11/22/1945 DOA: 10/04/2022     2 DOS: the patient was seen and examined on 10/07/2022   Brief hospital course: Mrs. Piscitelli was admitted to the hospital with the working diagnosis of heart failure decompensation.   77 yo female with the past medical history of heart failure, atrial fibrillation, hypertension, T2DM, CKD, dyslipidemia, coronary artery disease, sick sinus syndrome sp pacemaker implantation, obesity and OSA who presented with chest pain and dyspnea. Endorsed 5 days of worsening edema, dyspnea, and chest pain. Worsening symptoms with exertion. Pressure type chest pain. She was recently taken off hydrochlorothiazide due to hypercalcemia. On her initial physical examination her blood pressure was 166/74, HR 78, RR 19 and 02 saturation 96%, lungs with bilateral rales with no wheezing, heart with S1 and S2 present and regular, positive JVD, abdomen distended and no lower extremity edema.   Na 137, K 4,1 Cl 100, bicarbonate 28, glucose 223, bun 19 cr 1,37  BNP 778  High sensitive troponin 19 and 20 Wbc 5,4 hgb 11,8 plt 168   Chest radiograph with no cardiomegaly, bilateral hilar vascular congestion with cephalization of the vasculature. Pacemaker in place with one right atrial and one right ventricular lead.   EKG 71 bpm, left axis deviation, normal intervals, atrial pacing with 1st degree AV block, ventricular sensing, q waves V1 and V2, no significant ST segment or T wave changes.   09/22 patient had diuresis with improvement in her volume status.  Plan for possible discharge home tomorrow.  09/23 renal function not yet stable, positive hyponatremia.   Assessment and Plan: * Acute on chronic diastolic CHF (congestive heart failure) (HCC) Echocardiogram with preserved LV systolic function with EF 65 to 70%, moderate LVH, RV with preserved systolic function, LA with mild dilatation, mild MR,   Urine output documented 200  cc Systolic blood pressure 129  to 126 mmHg.   Continue with irbesartan, metoprolol, isosorbide, spironolactone and dapagliflozin.  Hold on loop diuretic today.   Acute hypoxemic respiratory failure due to acute cardiogenic pulmonary edema.  02 saturation today is 91% on on room air.  Patient has supplemental 02 at home.   Stage 3b chronic kidney disease (CKD) (HCC) Hyponatremia.   Renal function today with serum cr at 2,36 with K at 4,3 and serum bicarbonate at 25.  Na 127 P 4.1   Hold on loop diuretic therapy today Liberate diet. Follow up renal function and electrolytes in am.  Avoid hypotension and nephrotoxic medications.   CAD (coronary artery disease) No acute coronary syndrome.  Continue blood pressure control.  Continue with fenofibrate.   Primary hypertension Blood pressure has improved, continue with metoprolol, irebesartan and isosorbide.  Diuretic therapy with spironolactone.  Paroxysmal atrial fibrillation (HCC) Continue amiodarone for rate and rhythm control. Continue anticoagulation with apixaban. Continue telemetry monitoring.   Diabetes mellitus without complication (HCC) Plan to continue insulin therapy, sliding scale and basal for glucose control and monitoring.   Complex sleep apnea syndrome - BiPAP at bedtime with 4 L bleed in  Vulvar cancer [facility]) History of vulvar cancer s/p radiation and surgery in 2022.  Continues to follow with Gyn-onc.  Hypothyroidism Continue with levothyroxine.   Chronic diarrhea Patient states she has a history of chronic diarrhea that has been persistent and seems to have started around the time she had radiation for vulvar cancer. In 2023, she had a colonoscopy that was negative for proctitis.  GI infectious panel was negative given patient's  history of long-term diabetes, she is certainly at risk for exocrine pancreatic insufficiency.   - Continue home Imodium as needed        Subjective: Patient is feeling  much better, today with no dyspnea, no dizziness or lightheadedness   Physical Exam: Vitals:   10/07/22 0000 10/07/22 0406 10/07/22 0748 10/07/22 1610  BP: (!) 124/46 (!) 129/55 (!) 129/50 (!) 126/51  Pulse: 64 80 67 60  Resp: 18 18 18 18   Temp: 98 F (36.7 C) 98.2 F (36.8 C) 98.9 F (37.2 C) 98.9 F (37.2 C)  TempSrc: Oral Oral Oral Oral  SpO2: 99% 91% 91%   Weight:  79.6 kg    Height:       Neurology awake and alert ENT with mild pallor Cardiovascular with S1 and S2 present and regular, positive systolic murmur at the right lower sternal border, with no gallops No JVD No lower extremity edema Respiratory with no rales or wheezing, no rhonchi Abdomen with no distention  Data Reviewed:    Family Communication: no family at the bedside   Disposition: Status is: Inpatient Remains inpatient appropriate because: pending renal function improvement   Planned Discharge Destination: Home      Author: Coralie Keens, MD 10/07/2022 5:30 PM  For on call review [email or web address].

## 2022-10-08 DIAGNOSIS — I5033 Acute on chronic diastolic (congestive) heart failure: Secondary | ICD-10-CM | POA: Diagnosis not present

## 2022-10-08 LAB — BASIC METABOLIC PANEL
Anion gap: 11 (ref 5–15)
BUN: 47 mg/dL — ABNORMAL HIGH (ref 8–23)
CO2: 27 mmol/L (ref 22–32)
Calcium: 9.6 mg/dL (ref 8.9–10.3)
Chloride: 89 mmol/L — ABNORMAL LOW (ref 98–111)
Creatinine, Ser: 2.33 mg/dL — ABNORMAL HIGH (ref 0.44–1.00)
GFR, Estimated: 21 mL/min — ABNORMAL LOW (ref >60)
Glucose, Bld: 182 mg/dL — ABNORMAL HIGH (ref 70–99)
Potassium: 4.7 mmol/L (ref 3.5–5.1)
Sodium: 127 mmol/L — ABNORMAL LOW (ref 135–145)

## 2022-10-08 LAB — GLUCOSE, CAPILLARY
Glucose-Capillary: 159 mg/dL — ABNORMAL HIGH (ref 70–99)
Glucose-Capillary: 213 mg/dL — ABNORMAL HIGH (ref 70–99)
Glucose-Capillary: 202 mg/dL — ABNORMAL HIGH (ref 70–99)
Glucose-Capillary: 159 mg/dL — ABNORMAL HIGH (ref 70–99)

## 2022-10-08 LAB — RENAL FUNCTION PANEL
Albumin: 2.5 g/dL — ABNORMAL LOW (ref 3.5–5.0)
Anion gap: 12 (ref 5–15)
BUN: 47 mg/dL — ABNORMAL HIGH (ref 8–23)
CO2: 27 mmol/L (ref 22–32)
Calcium: 9.1 mg/dL (ref 8.9–10.3)
Chloride: 86 mmol/L — ABNORMAL LOW (ref 98–111)
Creatinine, Ser: 2.17 mg/dL — ABNORMAL HIGH (ref 0.44–1.00)
GFR, Estimated: 23 mL/min — ABNORMAL LOW (ref >60)
Glucose, Bld: 154 mg/dL — ABNORMAL HIGH (ref 70–99)
Phosphorus: 2.8 mg/dL (ref 2.5–4.6)
Potassium: 3.9 mmol/L (ref 3.5–5.1)
Sodium: 125 mmol/L — ABNORMAL LOW (ref 135–145)

## 2022-10-08 NOTE — Consult Note (Signed)
[facility] Consult   10/08/2022  SANIAA HOSCHAR Aug 21, 1945 [id number]  [facility] [[facility]]  Accountable Care Organization [ACO] Patient: [name] Medicare   Primary Care Provider: Merri Brunette, MD with [facility], provider is listed for the transition of care follow up appointments.   [facility] Liaison met patient at bedside at [facility]. Patient endorses PCP, she expresses support from her children, lives in [place] but has no issues getting to PCP in [place] or to appointments.   The patient was screened and discussed in progression meeting for prevention of readmission hospitalization with noted medium risk score for unplanned readmission risk.   The patient was assessed for potential [facility]) Care Management service needs for post hospital transition for care coordination. Review of patient's electronic medical record reveals patient is admitted with Acute on Chronic diastolic HF.   Plan: [facility] Liaison will continue to follow progress and disposition to asess for post hospital community care coordination/management needs.  Referral request for community care coordination: no current needs and she is aware she may get TOC follow up calls after transitioning home.   Community Care Management/Population Health does not replace or interfere with any arrangements made by the Inpatient Transition of Care team.   For questions contact:   Charlesetta Shanks, RN, BSN, CCM [place]  [facility], [facility] Liaison Direct Dial: [phone] or secure chat Website: [email or web address]

## 2022-10-08 NOTE — Progress Notes (Signed)
   Heart Failure Stewardship Pharmacist Progress Note   PCP: Merri Brunette, MD PCP-Cardiologist: None    HPI:  77 yo F with PMH of CHF, afib/flutter, HTN, T2DM, CKD IIIb, HLD, CAD, SSS s/p PPM, OSA on BiPAP, valvular cancer s/p resection and radiation, and hypothyroidism.  Presented to the ED on 9/21 with chest pain, fatigue, and shortness of breath. She was recently taken off hydrochlorothiazide by her PCP and felt she has been retaining fluid since. CXR without acute abnormalities, BNP 778. ECHO 9/22 showed LVEF 65-70% (was 55% in 2022), no RWMA, moderate LVH, RV normal, mild MR.   Current HF Medications: Beta Blocker: metoprolol tartrate 50 mg BID MRA: spironolactone 25 mg daily SGLT2i: Farxiga 10 mg daily  Prior to admission HF Medications: Diuretic: torsemide 10 mg daily PRN Beta blocker: metoprolol tartrate 50 mg BID ACE/ARB/ARNI: olmesartan 40 mg daily MRA: spironolactone 25 mg daily SGLT2i: Farxiga 10 mg daily  Pertinent Lab Values: Serum creatinine 2.17, BUN 47, Potassium 3.9, Sodium 125, BNP 778.2, Magnesium 2.4, A1c 5.4   Vital Signs: Weight: 176 lbs (admission weight: 174 lbs) Blood pressure: 130/50s  Heart rate: 60s  I/O: net -3.6L since admission  Medication Assistance / Insurance Benefits Check: Does the patient have prescription insurance?  Yes Type of insurance plan: [employer] Medicare  Outpatient Pharmacy:  Prior to admission outpatient pharmacy: [facility] Is the patient willing to use [facility] TOC pharmacy at discharge? Yes Is the patient willing to transition their outpatient pharmacy to utilize a [facility] outpatient pharmacy?   No    Assessment: 1. Acute on chronic diastolic CHF (LVEF 65-70%). NYHA class II symptoms. - Holding torsemide with AKI. Will likely need higher dosing at discharge with recently stopping hydrochlorothiazide. Hopefully can resume at 20 mg daily pending renal function. Strict I/Os and daily weights. Keep K>4 and Mg>2. - Now  holding ARB with AKI. Creatinine 2.36>2.17 today. - Continue metoprolol tartrate 50 mg BID - Continue spironolactone 25 mg daily - Continue Farxiga 10 mg daily   Plan: 1) Medication changes recommended at this time: - Agree with changes, can hopefully resume torsemide 20 mg daily pending renal function  2) Patient assistance: - None pending  3)  Education  - Patient has been educated on current HF medications and potential additions to HF medication regimen - Patient verbalizes understanding that over the next few months, these medication doses may change and more medications may be added to optimize HF regimen - Patient has been educated on basic disease state pathophysiology and goals of therapy   Sharen Hones, PharmD, BCPS Heart Failure Stewardship Pharmacist Phone [phone]

## 2022-10-08 NOTE — Progress Notes (Signed)
Occupational Therapy Treatment Patient Details Name: [name] MRN: [id number] DOB: [birth year] Today's Date: 10/08/2022   History of present illness Pt is a 77 y/o F admitted on 10/04/22 after presenting with c/o chest pain & dyspnea. Pt is being treated for acute on chronic diastolic CHF. PMH: heart failure, a-fib, HTN, DM2, CKD, dyslipidemia, CAD, SSS s/p pacemaker, obesity, OSA, vulvar CA   OT comments  Pt progressing towards goals this session, able to perform ADLs with set up - mod A, pt with minimal bowel incontinence during session. Pt supervision for transfers and hallway distance ambulation with RW. Pt educated on energy conservation strategies for home, hand out provided, pt verbalized understanding and states she has already implemented some of these strategies prior to admission. Pt presenting with impairments listed below, will follow acutely. Continue to recommend HHOT at d/c.       If plan is discharge home, recommend the following:  A little help with walking and/or transfers;A little help with bathing/dressing/bathroom;Assistance with cooking/housework;Assist for transportation;Help with stairs or ramp for entrance   Equipment Recommendations  None recommended by OT    Recommendations for Other Services PT consult    Precautions / Restrictions Precautions Precautions: Fall Restrictions Weight Bearing Restrictions: No       Mobility Bed Mobility               General bed mobility comments: OOB in chair upon arrival and departure    Transfers Overall transfer level: Needs assistance Equipment used: Rolling walker (2 wheels) Transfers: Sit to/from Stand Sit to Stand: Supervision                 Balance Overall balance assessment: Needs assistance   Sitting balance-Leahy Scale: Good     Standing balance support: During functional activity, No upper extremity supported Standing balance-Leahy Scale: Fair                              ADL either performed or assessed with clinical judgement   ADL Overall ADL's : Needs assistance/impaired     Grooming: Set up;Sitting           Upper Body Dressing : Minimal assistance;Standing   Lower Body Dressing: Moderate assistance Lower Body Dressing Details (indicate cue type and reason): donning socks, figure 4 Toilet Transfer: Ambulation;Rolling walker (2 wheels);Supervision/safety   Toileting- Architect and Hygiene: Supervision/safety       Functional mobility during ADLs: Supervision/safety;Rolling walker (2 wheels)      Extremity/Trunk Assessment Upper Extremity Assessment Upper Extremity Assessment: Overall WFL for tasks assessed   Lower Extremity Assessment Lower Extremity Assessment: Generalized weakness        Vision   Vision Assessment?: No apparent visual deficits   Perception Perception Perception: Not tested   Praxis Praxis Praxis: Not tested    Cognition Arousal: Alert Behavior During Therapy: WFL for tasks assessed/performed Overall Cognitive Status: Within Functional Limits for tasks assessed                                          Exercises      Shoulder Instructions       General Comments VSS on RA    Pertinent Vitals/ Pain       Pain Assessment Pain Assessment: No/denies pain  Home Living  Prior Functioning/Environment              Frequency  Min 1X/week        Progress Toward Goals  OT Goals(current goals can now be found in the care plan section)  Progress towards OT goals: Progressing toward goals  Acute Rehab OT Goals Patient Stated Goal: to go home OT Goal Formulation: With patient Time For Goal Achievement: 10/20/22 Potential to Achieve Goals: Good ADL Goals Pt Will Perform Grooming: with modified independence;standing Pt Will Perform Upper Body Dressing: with modified independence;standing Pt Will  Perform Lower Body Dressing: Independently;sit to/from stand Pt Will Transfer to Toilet: Independently  Plan      Co-evaluation                 AM-PAC OT "6 Clicks" Daily Activity     Outcome Measure   Help from another person eating meals?: None Help from another person taking care of personal grooming?: A Little Help from another person toileting, which includes using toliet, bedpan, or urinal?: A Little Help from another person bathing (including washing, rinsing, drying)?: A Little Help from another person to put on and taking off regular upper body clothing?: A Little Help from another person to put on and taking off regular lower body clothing?: A Little 6 Click Score: 19    End of Session Equipment Utilized During Treatment: Rolling walker (2 wheels)  OT Visit Diagnosis: Unsteadiness on feet (R26.81);Other abnormalities of gait and mobility (R26.89);Muscle weakness (generalized) (M62.81)   Activity Tolerance Patient tolerated treatment well   Patient Left in chair;with call bell/phone within reach   Nurse Communication Mobility status        Time: 8295-6213 OT Time Calculation (min): 22 min  Charges: OT General Charges $OT Visit: 1 Visit OT Treatments $Self Care/Home Management : 8-22 mins  Carver Fila, OTD, OTR/L SecureChat Preferred Acute Rehab [phone]   Carver Fila Koonce 10/08/2022, 9:38 AM

## 2022-10-08 NOTE — Plan of Care (Signed)
Problem: Education: Goal: Ability to describe self-care measures that may prevent or decrease complications (Diabetes Survival Skills Education) will improve 10/08/2022 1408 by Lance Bosch, RN Outcome: Progressing 10/08/2022 1408 by Lance Bosch, RN Outcome: Progressing Goal: Individualized Educational Video(s) 10/08/2022 1408 by Lance Bosch, RN Outcome: Progressing 10/08/2022 1408 by Lance Bosch, RN Outcome: Progressing   Problem: Coping: Goal: Ability to adjust to condition or change in health will improve 10/08/2022 1408 by Lance Bosch, RN Outcome: Progressing 10/08/2022 1408 by Lance Bosch, RN Outcome: Progressing   Problem: Fluid Volume: Goal: Ability to maintain a balanced intake and output will improve 10/08/2022 1408 by Lance Bosch, RN Outcome: Progressing 10/08/2022 1408 by Lance Bosch, RN Outcome: Progressing   Problem: Health Behavior/Discharge Planning: Goal: Ability to identify and utilize available resources and services will improve 10/08/2022 1408 by Lance Bosch, RN Outcome: Progressing 10/08/2022 1408 by Lance Bosch, RN Outcome: Progressing Goal: Ability to manage health-related needs will improve 10/08/2022 1408 by Lance Bosch, RN Outcome: Progressing 10/08/2022 1408 by Lance Bosch, RN Outcome: Progressing   Problem: Metabolic: Goal: Ability to maintain appropriate glucose levels will improve 10/08/2022 1408 by Lance Bosch, RN Outcome: Progressing 10/08/2022 1408 by Lance Bosch, RN Outcome: Progressing   Problem: Nutritional: Goal: Maintenance of adequate nutrition will improve 10/08/2022 1408 by Lance Bosch, RN Outcome: Progressing 10/08/2022 1408 by Lance Bosch, RN Outcome: Progressing Goal: Progress toward achieving an optimal weight will improve 10/08/2022 1408 by Lance Bosch, RN Outcome: Progressing 10/08/2022 1408 by Lance Bosch, RN Outcome:  Progressing   Problem: Skin Integrity: Goal: Risk for impaired skin integrity will decrease 10/08/2022 1408 by Lance Bosch, RN Outcome: Progressing 10/08/2022 1408 by Lance Bosch, RN Outcome: Progressing   Problem: Tissue Perfusion: Goal: Adequacy of tissue perfusion will improve 10/08/2022 1408 by Lance Bosch, RN Outcome: Progressing 10/08/2022 1408 by Lance Bosch, RN Outcome: Progressing   Problem: Education: Goal: Knowledge of General Education information will improve Description: Including pain rating scale, medication(s)/side effects and non-pharmacologic comfort measures 10/08/2022 1408 by Lance Bosch, RN Outcome: Progressing 10/08/2022 1408 by Lance Bosch, RN Outcome: Progressing   Problem: Health Behavior/Discharge Planning: Goal: Ability to manage health-related needs will improve 10/08/2022 1408 by Lance Bosch, RN Outcome: Progressing 10/08/2022 1408 by Lance Bosch, RN Outcome: Progressing   Problem: Clinical Measurements: Goal: Ability to maintain clinical measurements within normal limits will improve 10/08/2022 1408 by Lance Bosch, RN Outcome: Progressing 10/08/2022 1408 by Lance Bosch, RN Outcome: Progressing Goal: Will remain free from infection 10/08/2022 1408 by Lance Bosch, RN Outcome: Progressing 10/08/2022 1408 by Lance Bosch, RN Outcome: Progressing Goal: Diagnostic test results will improve 10/08/2022 1408 by Lance Bosch, RN Outcome: Progressing 10/08/2022 1408 by Lance Bosch, RN Outcome: Progressing Goal: Respiratory complications will improve 10/08/2022 1408 by Lance Bosch, RN Outcome: Progressing 10/08/2022 1408 by Lance Bosch, RN Outcome: Progressing Goal: Cardiovascular complication will be avoided 10/08/2022 1408 by Lance Bosch, RN Outcome: Progressing 10/08/2022 1408 by Lance Bosch, RN Outcome: Progressing   Problem: Activity: Goal: Risk  for activity intolerance will decrease 10/08/2022 1408 by Lance Bosch, RN Outcome: Progressing 10/08/2022 1408 by Lance Bosch, RN Outcome: Progressing   Problem: Nutrition: Goal: Adequate nutrition will be maintained 10/08/2022 1408 by Lance Bosch, RN Outcome: Progressing 10/08/2022 1408 by Lance Bosch, RN Outcome: Progressing  Problem: Coping: Goal: Level of anxiety will decrease 10/08/2022 1408 by Lance Bosch, RN Outcome: Progressing 10/08/2022 1408 by Lance Bosch, RN Outcome: Progressing   Problem: Elimination: Goal: Will not experience complications related to bowel motility Outcome: Progressing Goal: Will not experience complications related to urinary retention Outcome: Progressing   Problem: Pain Managment: Goal: General experience of comfort will improve Outcome: Progressing   Problem: Safety: Goal: Ability to remain free from injury will improve Outcome: Progressing   Problem: Skin Integrity: Goal: Risk for impaired skin integrity will decrease Outcome: Progressing

## 2022-10-08 NOTE — Care Management Important Message (Signed)
Important Message  Patient Details  Name: [name] MRN: [id number] Date of Birth: 10/14/1945   Important Message Given:  Yes - Medicare IM     Dorena Bodo 10/08/2022, 3:24 PM

## 2022-10-08 NOTE — Progress Notes (Signed)
Mobility Specialist Progress Note:   10/08/22 1603  Mobility  Activity Ambulated with assistance in hallway  Level of Assistance Contact guard assist, steadying assist  Assistive Device Front wheel walker  Distance Ambulated (ft) 200 ft  Activity Response Tolerated well  Mobility Referral Yes  $Mobility charge 1 Mobility  Mobility Specialist Start Time (ACUTE ONLY) 1520  Mobility Specialist Stop Time (ACUTE ONLY) 1530  Mobility Specialist Time Calculation (min) (ACUTE ONLY) 10 min   Pre Mobility: 61 HR During Mobility: 81 HR Post Mobility: 72 HR   Pt received in chair, agreeable to mobility. Pt c/o feeling weak requiring standing rest break, otherwise asymptomatic throughout. Pt left in chair with call bell in reach and all needs met.   [name]  Mobility Specialist Please contact via [employer] office at [phone]

## 2022-10-09 DIAGNOSIS — I5033 Acute on chronic diastolic (congestive) heart failure: Secondary | ICD-10-CM | POA: Diagnosis not present

## 2022-10-09 LAB — BASIC METABOLIC PANEL
Anion gap: 9 (ref 5–15)
BUN: 48 mg/dL — ABNORMAL HIGH (ref 8–23)
CO2: 24 mmol/L (ref 22–32)
Calcium: 8.8 mg/dL — ABNORMAL LOW (ref 8.9–10.3)
Chloride: 94 mmol/L — ABNORMAL LOW (ref 98–111)
Creatinine, Ser: 2.13 mg/dL — ABNORMAL HIGH (ref 0.44–1.00)
GFR, Estimated: 23 mL/min — ABNORMAL LOW (ref >60)
Glucose, Bld: 197 mg/dL — ABNORMAL HIGH (ref 70–99)
Potassium: 4.2 mmol/L (ref 3.5–5.1)
Sodium: 127 mmol/L — ABNORMAL LOW (ref 135–145)

## 2022-10-09 LAB — GLUCOSE, CAPILLARY: Glucose-Capillary: 163 mg/dL — ABNORMAL HIGH (ref 70–99)

## 2022-10-09 MED ORDER — INSULIN ASPART PROT & ASPART (70-30 MIX) 100 UNIT/ML ~~LOC~~ SUSP: 10.0000 [IU] | SUBCUTANEOUS | Status: DC

## 2022-10-09 MED ORDER — METOPROLOL TARTRATE 50 MG PO TABS
50.0000 mg | ORAL_TABLET | Freq: Two times a day (BID) | ORAL | Status: DC
  Administered 2022-10-09: 50 mg via ORAL
  Filled 2022-10-09: qty 1

## 2022-10-09 MED ORDER — TORSEMIDE 10 MG PO TABS: ORAL_TABLET | ORAL | Status: DC

## 2022-10-09 MED ORDER — METOPROLOL TARTRATE 25 MG PO TABS: 25.0000 mg | ORAL_TABLET | Freq: Two times a day (BID) | ORAL | Status: DC

## 2022-10-09 NOTE — TOC Transition Note (Signed)
Transition of Care [facility]) - CM/SW Discharge Note   Patient Details  Name: [name] MRN: [id number] Date of Birth: 02-26-1945  [facility]) CM/SW Contact:  Leone Haven, RN Phone Number: 10/09/2022, 8:51 AM   Clinical Narrative:    Patient for dc, she has no needs. She has transportation at [employer].     Barriers to Discharge: Continued Medical Work up   Patient Goals and CMS Choice   Choice offered to / list presented to : NA  Discharge Placement                         Discharge Plan and Services Additional resources added to the After Visit Summary for   In-house Referral: NA Discharge Planning Services: CM Consult Post Acute Care Choice: NA          DME Arranged: N/A DME Agency: NA       HH Arranged: NA          Social Determinants of Health (SDOH) Interventions SDOH Screenings   Food Insecurity: No Food Insecurity (10/04/2022)  Housing: Low Risk  (10/06/2022)  Transportation Needs: No Transportation Needs (10/06/2022)  Utilities: Not At Risk (10/04/2022)  Alcohol Screen: Low Risk  (10/06/2022)  Financial Resource Strain: Low Risk  (10/06/2022)  Tobacco Use: Low Risk  (10/04/2022)     Readmission Risk Interventions     No data to display

## 2022-10-09 NOTE — Plan of Care (Signed)
Problem: Activity: Goal: Risk for activity intolerance will decrease Outcome: Progressing   Problem: Coping: Goal: Level of anxiety will decrease Outcome: Progressing

## 2022-10-09 NOTE — Progress Notes (Signed)
Pt has orders to be discharged. Discharge instructions given and pt has no additional questions at this time. Medication regimen reviewed and pt educated. Pt verbalized understanding and has no additional questions. Telemetry box removed. IV removed and site in good condition. Pt stable and will be taken to d/c lounge to wait for her son.

## 2022-10-10 ENCOUNTER — Inpatient Hospital Stay: Payer: Medicare HMO | Admitting: Gynecologic Oncology

## 2022-10-10 DIAGNOSIS — C519 Malignant neoplasm of vulva, unspecified: Secondary | ICD-10-CM

## 2022-10-10 NOTE — Discharge Summary (Addendum)
10/21/2022 @ 11 am PLEASE bring a current medication list to appointment FREE valet parking, Entrance C, off [employer] information: [address] [place] [address] [phone]        Merri Brunette, MD. Go on 10/13/2022.   Specialty: Internal Medicine Why: @2 :15pm Contact information: [address] [place] [address] [place] [address] [phone]                  The results of significant diagnostics from this hospitalization (including imaging, microbiology, ancillary and laboratory) are listed below for reference.    Significant Diagnostic Studies: ECHOCARDIOGRAM COMPLETE  Result Date: 10/05/2022    ECHOCARDIOGRAM REPORT   Patient Name:   [name] Shipper Date of Exam:  10/05/2022 Medical Rec #:  [id number]       Height:       62.0 in Accession #:    [id number]      Weight:       174.2 lb Date of Birth:  Jul 07, 1945        BSA:          1.803 m Patient Age:    77 years        BP:           153/66 mmHg Patient Gender: F               HR:           61 bpm. Exam Location:  Inpatient Procedure: 2D Echo, Color Doppler and Cardiac Doppler Indications:    acute diastolic chf  History:        Patient has prior history of Echocardiogram examinations, most                 recent 06/08/2017. CAD, Pacemaker, chronic kidney disease.,                 Arrythmias:Atrial Fibrillation, Signs/Symptoms:Dyspnea and Chest                 Pain; Risk Factors:Diabetes, Hypertension, Dyslipidemia and                 Sleep Apnea.  Sonographer:    Delcie Roch RDCS Referring Phys: [id number] Verdene Lennert IMPRESSIONS  1. Left ventricular ejection fraction, by estimation, is 65 to 70%. The left ventricle has normal function. The left ventricle has no regional wall motion abnormalities. There is moderate left ventricular hypertrophy. Left ventricular diastolic parameters are indeterminate.  2. Right ventricular systolic function is normal. The right ventricular size is normal.  3. Left atrial size was mildly dilated.  4. The mitral valve is normal in structure. Mild mitral valve regurgitation. No evidence of mitral stenosis. The mean mitral valve gradient is 3.0 mmHg.  5. The aortic valve is normal in structure. Aortic valve regurgitation is not visualized. No aortic stenosis is present.  6. The inferior vena cava is normal in size with greater than 50% respiratory variability, suggesting right atrial pressure of 3 mmHg. FINDINGS  Left Ventricle: Left ventricular ejection fraction, by estimation, is 65 to 70%. The left ventricle has normal function. The left ventricle has no regional wall motion abnormalities. The left ventricular internal cavity size was normal in size. There is  moderate left ventricular  hypertrophy. Left ventricular diastolic parameters are indeterminate. Right Ventricle: The right ventricular size is normal. No increase in right ventricular wall thickness. Right ventricular systolic function is normal. Left Atrium: Left atrial size was mildly dilated. Right Atrium: Right atrial  Physician Discharge Summary  [name]:[id number] DOB: [birth year] DOA: 10/04/2022  PCP: Merri Brunette, MD  Admit date: 10/04/2022 Discharge date: 10/09/2022  Time spent:45 minutes  Recommendations for Outpatient Follow-up:  PCP in 1 week, please check BMP at follow-up Advanced heart failure clinic on 10/11   Discharge Diagnoses:  Principal Problem:   Acute on chronic diastolic CHF (congestive heart failure) (HCC) Active Problems:   Stage 3b chronic kidney disease (CKD) (HCC)   CAD (coronary artery disease)   Primary hypertension   Paroxysmal atrial fibrillation (HCC)   Diabetes mellitus without complication (HCC)   Complex sleep apnea syndrome   Vulvar cancer (HCC)   Hypothyroidism   Chronic diarrhea   Discharge Condition: Improved  Diet recommendation: Low-sodium, heart healthy  Filed Weights   10/07/22 0406 10/08/22 0557 10/09/22 0445  Weight: 79.6 kg 80 kg 80.1 kg    History of present illness:  77/F w  diastolic heart failure, P.Afib, hypertension, T2DM, CKD, dyslipidemia, CAD, sick sinus syndrome sp pacemaker implantation, obesity and OSA presented with chest pain and dyspnea In ED,  cr 1,37, BNP 778, troponin 19 and 20 Cxr w/bilateral hilar vascular congestion with cephalization of the vasculature -Admitted, started on diuretics, complicated by worsening AKI and hyponatremia  Hospital Course:  Acute on chronic diastolic CHF (congestive heart failure) (HCC) -Echo with EF 65 to 70%, moderate LVH, RV preserved -Volume status has improved, 4.6 L negative -Creatinine up to 2.3 yesterday from baseline of 1.3, held olmesartan and diuretics yesterday, volume status has improved  -Continue Farxiga Aldactone metoprolol and Imdur at discharge  -Creatinine improving, torsemide resumed, 10 mg alternating with 20 mg at discharge, advised monitoring weight daily and follow-up BMP in 1 week   Acute hypoxemic respiratory failure due to acute cardiogenic pulmonary edema.   -Resolved, weaned off O2   AKI on stage 3b chronic kidney disease (CKD) (HCC) Hyponatremia.  -Baseline creatinine around 1.3, trended up to 2.36 yesterday with worsening hyponatremia -Further diuretics and ARB held -Creatinine starting to improve, torsemide resumed at discharge, needs BMP in 1 week   CAD (coronary artery disease) No ACS Continue with fenofibrate.    Primary hypertension -Meds as above, holding ARB   Paroxysmal atrial fibrillation (HCC) Continue amiodarone, apixaban   Diabetes mellitus without complication (HCC) -Stable, continue glargine, Farxiga and sliding scale   Complex sleep apnea syndrome - BiPAP at bedtime with 4 L bleed in   Vulvar cancer (HCC) History of vulvar cancer s/p radiation and surgery in 2022.  Continues to follow with Gyn-onc.   Hypothyroidism Continue with levothyroxine.    Chronic diarrhea Patient states she has a history of chronic diarrhea that has been persistent and seems to have started around the time she had radiation for vulvar cancer. In 2023, she had a colonoscopy that was negative for proctitis.  GI infectious panel was negative given patient's history of long-term diabetes, she is certainly at risk for exocrine pancreatic insufficiency.  - Continue home Imodium as needed -Follow-up with gastroenterology, consider elimination diet including  dairy, artificial sweeteners etc.    Discharge Exam: Vitals:   10/09/22 0445 10/09/22 0754  BP: (!) 98/37 (!) 155/56  Pulse:  60  Resp:  18  Temp:  98.6 F (37 C)  SpO2:  96%   Gen: Awake, Alert, Oriented X 3, obese, sitting up in chair chair HEENT: no JVD Lungs: Good air movement bilaterally, CTAB CVS: S1S2/RRR Abd: soft, Non tender, mildly distended, BS present Extremities: No edema Skin: no  10/21/2022 @ 11 am PLEASE bring a current medication list to appointment FREE valet parking, Entrance C, off [employer] information: [address] [place] [address] [phone]        Merri Brunette, MD. Go on 10/13/2022.   Specialty: Internal Medicine Why: @2 :15pm Contact information: [address] [place] [address] [place] [address] [phone]                  The results of significant diagnostics from this hospitalization (including imaging, microbiology, ancillary and laboratory) are listed below for reference.    Significant Diagnostic Studies: ECHOCARDIOGRAM COMPLETE  Result Date: 10/05/2022    ECHOCARDIOGRAM REPORT   Patient Name:   [name] Shipper Date of Exam:  10/05/2022 Medical Rec #:  [id number]       Height:       62.0 in Accession #:    [id number]      Weight:       174.2 lb Date of Birth:  Jul 07, 1945        BSA:          1.803 m Patient Age:    77 years        BP:           153/66 mmHg Patient Gender: F               HR:           61 bpm. Exam Location:  Inpatient Procedure: 2D Echo, Color Doppler and Cardiac Doppler Indications:    acute diastolic chf  History:        Patient has prior history of Echocardiogram examinations, most                 recent 06/08/2017. CAD, Pacemaker, chronic kidney disease.,                 Arrythmias:Atrial Fibrillation, Signs/Symptoms:Dyspnea and Chest                 Pain; Risk Factors:Diabetes, Hypertension, Dyslipidemia and                 Sleep Apnea.  Sonographer:    Delcie Roch RDCS Referring Phys: [id number] Verdene Lennert IMPRESSIONS  1. Left ventricular ejection fraction, by estimation, is 65 to 70%. The left ventricle has normal function. The left ventricle has no regional wall motion abnormalities. There is moderate left ventricular hypertrophy. Left ventricular diastolic parameters are indeterminate.  2. Right ventricular systolic function is normal. The right ventricular size is normal.  3. Left atrial size was mildly dilated.  4. The mitral valve is normal in structure. Mild mitral valve regurgitation. No evidence of mitral stenosis. The mean mitral valve gradient is 3.0 mmHg.  5. The aortic valve is normal in structure. Aortic valve regurgitation is not visualized. No aortic stenosis is present.  6. The inferior vena cava is normal in size with greater than 50% respiratory variability, suggesting right atrial pressure of 3 mmHg. FINDINGS  Left Ventricle: Left ventricular ejection fraction, by estimation, is 65 to 70%. The left ventricle has normal function. The left ventricle has no regional wall motion abnormalities. The left ventricular internal cavity size was normal in size. There is  moderate left ventricular  hypertrophy. Left ventricular diastolic parameters are indeterminate. Right Ventricle: The right ventricular size is normal. No increase in right ventricular wall thickness. Right ventricular systolic function is normal. Left Atrium: Left atrial size was mildly dilated. Right Atrium: Right atrial  10/21/2022 @ 11 am PLEASE bring a current medication list to appointment FREE valet parking, Entrance C, off [employer] information: [address] [place] [address] [phone]        Merri Brunette, MD. Go on 10/13/2022.   Specialty: Internal Medicine Why: @2 :15pm Contact information: [address] [place] [address] [place] [address] [phone]                  The results of significant diagnostics from this hospitalization (including imaging, microbiology, ancillary and laboratory) are listed below for reference.    Significant Diagnostic Studies: ECHOCARDIOGRAM COMPLETE  Result Date: 10/05/2022    ECHOCARDIOGRAM REPORT   Patient Name:   [name] Shipper Date of Exam:  10/05/2022 Medical Rec #:  [id number]       Height:       62.0 in Accession #:    [id number]      Weight:       174.2 lb Date of Birth:  Jul 07, 1945        BSA:          1.803 m Patient Age:    77 years        BP:           153/66 mmHg Patient Gender: F               HR:           61 bpm. Exam Location:  Inpatient Procedure: 2D Echo, Color Doppler and Cardiac Doppler Indications:    acute diastolic chf  History:        Patient has prior history of Echocardiogram examinations, most                 recent 06/08/2017. CAD, Pacemaker, chronic kidney disease.,                 Arrythmias:Atrial Fibrillation, Signs/Symptoms:Dyspnea and Chest                 Pain; Risk Factors:Diabetes, Hypertension, Dyslipidemia and                 Sleep Apnea.  Sonographer:    Delcie Roch RDCS Referring Phys: [id number] Verdene Lennert IMPRESSIONS  1. Left ventricular ejection fraction, by estimation, is 65 to 70%. The left ventricle has normal function. The left ventricle has no regional wall motion abnormalities. There is moderate left ventricular hypertrophy. Left ventricular diastolic parameters are indeterminate.  2. Right ventricular systolic function is normal. The right ventricular size is normal.  3. Left atrial size was mildly dilated.  4. The mitral valve is normal in structure. Mild mitral valve regurgitation. No evidence of mitral stenosis. The mean mitral valve gradient is 3.0 mmHg.  5. The aortic valve is normal in structure. Aortic valve regurgitation is not visualized. No aortic stenosis is present.  6. The inferior vena cava is normal in size with greater than 50% respiratory variability, suggesting right atrial pressure of 3 mmHg. FINDINGS  Left Ventricle: Left ventricular ejection fraction, by estimation, is 65 to 70%. The left ventricle has normal function. The left ventricle has no regional wall motion abnormalities. The left ventricular internal cavity size was normal in size. There is  moderate left ventricular  hypertrophy. Left ventricular diastolic parameters are indeterminate. Right Ventricle: The right ventricular size is normal. No increase in right ventricular wall thickness. Right ventricular systolic function is normal. Left Atrium: Left atrial size was mildly dilated. Right Atrium: Right atrial  Physician Discharge Summary  [name]:[id number] DOB: [birth year] DOA: 10/04/2022  PCP: Merri Brunette, MD  Admit date: 10/04/2022 Discharge date: 10/09/2022  Time spent:45 minutes  Recommendations for Outpatient Follow-up:  PCP in 1 week, please check BMP at follow-up Advanced heart failure clinic on 10/11   Discharge Diagnoses:  Principal Problem:   Acute on chronic diastolic CHF (congestive heart failure) (HCC) Active Problems:   Stage 3b chronic kidney disease (CKD) (HCC)   CAD (coronary artery disease)   Primary hypertension   Paroxysmal atrial fibrillation (HCC)   Diabetes mellitus without complication (HCC)   Complex sleep apnea syndrome   Vulvar cancer (HCC)   Hypothyroidism   Chronic diarrhea   Discharge Condition: Improved  Diet recommendation: Low-sodium, heart healthy  Filed Weights   10/07/22 0406 10/08/22 0557 10/09/22 0445  Weight: 79.6 kg 80 kg 80.1 kg    History of present illness:  77/F w  diastolic heart failure, P.Afib, hypertension, T2DM, CKD, dyslipidemia, CAD, sick sinus syndrome sp pacemaker implantation, obesity and OSA presented with chest pain and dyspnea In ED,  cr 1,37, BNP 778, troponin 19 and 20 Cxr w/bilateral hilar vascular congestion with cephalization of the vasculature -Admitted, started on diuretics, complicated by worsening AKI and hyponatremia  Hospital Course:  Acute on chronic diastolic CHF (congestive heart failure) (HCC) -Echo with EF 65 to 70%, moderate LVH, RV preserved -Volume status has improved, 4.6 L negative -Creatinine up to 2.3 yesterday from baseline of 1.3, held olmesartan and diuretics yesterday, volume status has improved  -Continue Farxiga Aldactone metoprolol and Imdur at discharge  -Creatinine improving, torsemide resumed, 10 mg alternating with 20 mg at discharge, advised monitoring weight daily and follow-up BMP in 1 week   Acute hypoxemic respiratory failure due to acute cardiogenic pulmonary edema.   -Resolved, weaned off O2   AKI on stage 3b chronic kidney disease (CKD) (HCC) Hyponatremia.  -Baseline creatinine around 1.3, trended up to 2.36 yesterday with worsening hyponatremia -Further diuretics and ARB held -Creatinine starting to improve, torsemide resumed at discharge, needs BMP in 1 week   CAD (coronary artery disease) No ACS Continue with fenofibrate.    Primary hypertension -Meds as above, holding ARB   Paroxysmal atrial fibrillation (HCC) Continue amiodarone, apixaban   Diabetes mellitus without complication (HCC) -Stable, continue glargine, Farxiga and sliding scale   Complex sleep apnea syndrome - BiPAP at bedtime with 4 L bleed in   Vulvar cancer (HCC) History of vulvar cancer s/p radiation and surgery in 2022.  Continues to follow with Gyn-onc.   Hypothyroidism Continue with levothyroxine.    Chronic diarrhea Patient states she has a history of chronic diarrhea that has been persistent and seems to have started around the time she had radiation for vulvar cancer. In 2023, she had a colonoscopy that was negative for proctitis.  GI infectious panel was negative given patient's history of long-term diabetes, she is certainly at risk for exocrine pancreatic insufficiency.  - Continue home Imodium as needed -Follow-up with gastroenterology, consider elimination diet including  dairy, artificial sweeteners etc.    Discharge Exam: Vitals:   10/09/22 0445 10/09/22 0754  BP: (!) 98/37 (!) 155/56  Pulse:  60  Resp:  18  Temp:  98.6 F (37 C)  SpO2:  96%   Gen: Awake, Alert, Oriented X 3, obese, sitting up in chair chair HEENT: no JVD Lungs: Good air movement bilaterally, CTAB CVS: S1S2/RRR Abd: soft, Non tender, mildly distended, BS present Extremities: No edema Skin: no

## 2022-10-13 DIAGNOSIS — I480 Paroxysmal atrial fibrillation: Secondary | ICD-10-CM | POA: Diagnosis not present

## 2022-10-13 DIAGNOSIS — Z950 Presence of cardiac pacemaker: Secondary | ICD-10-CM | POA: Diagnosis not present

## 2022-10-13 DIAGNOSIS — Z09 Encounter for follow-up examination after completed treatment for conditions other than malignant neoplasm: Secondary | ICD-10-CM | POA: Diagnosis not present

## 2022-10-13 DIAGNOSIS — R252 Cramp and spasm: Secondary | ICD-10-CM | POA: Diagnosis not present

## 2022-10-13 DIAGNOSIS — I5033 Acute on chronic diastolic (congestive) heart failure: Secondary | ICD-10-CM | POA: Diagnosis not present

## 2022-10-20 ENCOUNTER — Telehealth (HOSPITAL_COMMUNITY): Payer: Self-pay

## 2022-10-20 NOTE — Progress Notes (Signed)
HEART & VASCULAR TRANSITION OF CARE CONSULT NOTE     Referring Physician: Dr Ella Jubilee  Primary Care: Primary Cardiologist:  HPI: Referred to clinic by Dr Ella Jubilee  for heart failure consultation.   [name] is a 77 year old with a history of HFpEF, CAD, MR,  PAF, and HTN.   Admitted with A/C HFpEF. Diuresed with IV lasix and transitioned to torsemide 10 mg one day and alternating with 20 mg.   Overall feeling fine. Denies SOB/PND/Orthopnea. Appetite ok. No fever or chills. Weight at home pounds. Taking all medications     Cardiac Testing  Echo 09/2022   1. Left ventricular ejection fraction, by estimation, is 65 to 70%. The  left ventricle has normal function. The left ventricle has no regional  wall motion abnormalities. There is moderate left ventricular hypertrophy.  Left ventricular diastolic  parameters are indeterminate.   2. Right ventricular systolic function is normal. The right ventricular  size is normal.   3. Left atrial size was mildly dilated.   4. The mitral valve is normal in structure. Mild mitral valve  regurgitation. No evidence of mitral stenosis. The mean mitral valve  gradient is 3.0 mmHg.   5. The aortic valve is normal in structure. Aortic valve regurgitation is  not visualized. No aortic stenosis is present.   Review of Systems: [y] = yes, [ ]  = no   General: Weight gain [ ] ; Weight loss [ ] ; Anorexia [ ] ; Fatigue [ ] ; Fever [ ] ; Chills [ ] ; Weakness [ ]   Cardiac: Chest pain/pressure [ ] ; Resting SOB [ ] ; Exertional SOB [ ] ; Orthopnea [ ] ; Pedal Edema [ ] ; Palpitations [ ] ; Syncope [ ] ; Presyncope [ ] ; Paroxysmal nocturnal dyspnea[ ]   Pulmonary: Cough [ ] ; Wheezing[ ] ; Hemoptysis[ ] ; Sputum [ ] ; Snoring [ ]   GI: Vomiting[ ] ; Dysphagia[ ] ; Melena[ ] ; Hematochezia [ ] ; Heartburn[ ] ; Abdominal pain [ ] ; Constipation [ ] ; Diarrhea [ ] ; BRBPR [ ]   GU: Hematuria[ ] ; Dysuria [ ] ; Nocturia[ ]   Vascular: Pain in legs with walking [ ] ; Pain in feet with  lying flat [ ] ; Non-healing sores [ ] ; Stroke [ ] ; TIA [ ] ; Slurred speech [ ] ;  Neuro: Headaches[ ] ; Vertigo[ ] ; Seizures[ ] ; Paresthesias[ ] ;Blurred vision [ ] ; Diplopia [ ] ; Vision changes [ ]   Ortho/Skin: Arthritis [ ] ; Joint pain [ ] ; Muscle pain [ ] ; Joint swelling [ ] ; Back Pain [ ] ; Rash [ ]   Psych: Depression[ ] ; Anxiety[ ]   Heme: Bleeding problems [ ] ; Clotting disorders [ ] ; Anemia [ ]   Endocrine: Diabetes [ ] ; Thyroid dysfunction[ ]    Past Medical History:  Diagnosis Date   Arthritis    CAD (coronary artery disease)    Diabetes mellitus without complication (HCC)    Diastolic heart failure (HCC)    Dyspnea    Encounter for care of pacemaker 09/03/2018   Family history of kidney cancer    Family history of throat cancer    Family history of uterine cancer    History of chickenpox    History of radiation therapy    Pelvis 10/23/20-12/14/20- Dr. Antony Blackbird   Hx of psoriatic arthritis    Hyperlipemia    Hypertension    Hypertension 05/03/2018   Hypothyroidism    Mitral valve disorders(424.0)    Myocardial infarction (HCC)    Obstructive sleep apnea    OSA (obstructive sleep apnea)    using BiPAP   Pacemaker  Paroxysmal atrial fibrillation (HCC) 10/11/2016   Peripheral neuropathy    Renal disorder Kidney disease stage2   Sick sinus syndrome [facility])    S/p dual-chamber pacemaker   Thyroid disease hypothyroid   Urine incontinence    Vitamin D deficiency    Vulvar cancer (HCC) 2022   s/p surgery and radiation therapy October-December 2022    Current Outpatient Medications  Medication Sig Dispense Refill   amiodarone (PACERONE) 200 MG tablet Take 1/2 (one-half) tablet by mouth once daily (Patient taking differently: Take 100 mg by mouth daily.) 45 tablet 3   apixaban (ELIQUIS) 5 MG TABS tablet Take 1 tablet (5 mg total) by mouth 2 (two) times daily. 180 tablet 3   Blood Glucose Monitoring Suppl (ACCU-CHEK AVIVA PLUS) w/Device KIT      cholecalciferol (VITAMIN  D3) 25 MCG (1000 UNIT) tablet Take 2,000 Units by mouth in the morning.     clobetasol cream (TEMOVATE) 0.05 % Apply 1 Application topically 2 (two) times daily as needed (rash).     dapagliflozin propanediol (FARXIGA) 10 MG TABS tablet Take 1 tablet (10 mg total) by mouth daily before breakfast. 90 tablet 3   fenofibrate 160 MG tablet Take 160 mg by mouth daily with supper.      insulin aspart protamine- aspart (NOVOLOG MIX 70/30) (70-30) 100 UNIT/ML injection Inject 0.1-0.2 mLs (10-20 Units total) into the skin See admin instructions. Inject 20 units subcutaneously with breakfast & inject 10 units subcutaneously with supper (may adjust based on blood sugar readings)     levothyroxine (SYNTHROID) 137 MCG tablet Take 137 mcg by mouth daily before breakfast.     lidocaine (XYLOCAINE) 5 % ointment Apply 1 Application topically 3 (three) times daily as needed. (Patient taking differently: Apply 1 Application topically 3 (three) times daily as needed (psoriatic arthritis).) 35.44 g 0   loperamide (IMODIUM) 2 MG capsule Take 4 mg by mouth 2 (two) times daily.     metoprolol tartrate (LOPRESSOR) 50 MG tablet Take 1 tablet by mouth twice daily (Patient taking differently: Take 50 mg by mouth 2 (two) times daily.) 180 tablet 0   nitroGLYCERIN (NITROSTAT) 0.4 MG SL tablet Place 1 tablet (0.4 mg total) under the tongue every 5 (five) minutes as needed for chest pain. 25 tablet 1   spironolactone (ALDACTONE) 25 MG tablet Take 1 tablet by mouth once daily (Patient taking differently: Take 25 mg by mouth at bedtime.) 90 tablet 0   torsemide (DEMADEX) 10 MG tablet Alternate 10mg  with 20mg  daily     No current facility-administered medications for this visit.    Allergies  Allergen Reactions   Hydrocodone Other (See Comments)    Lethargic    Invokana [Canagliflozin] Palpitations and Other (See Comments)    Made heart race   Oxycodone Other (See Comments)    Lethargic       Social History    Socioeconomic History   Marital status: Widowed    Spouse name: Not on file   Number of children: 3   Years of education: Not on file   Highest education level: Not on file  Occupational History   Occupation: Retired  Tobacco Use   Smoking status: Never   Smokeless tobacco: Never  Vaping Use   Vaping status: Never Used  Substance and Sexual Activity   Alcohol use: No    Alcohol/week: 0.0 standard drinks of alcohol   Drug use: No   Sexual activity: Not Currently    Birth control/protection: Post-menopausal  Other  Topics Concern   Not on file  Social History Narrative   Husband recently passed away on 05-26-20.   Social Determinants of Health   Financial Resource Strain: Low Risk  (10/06/2022)   Overall Financial Resource Strain (CARDIA)    Difficulty of Paying Living Expenses: Not hard at all  Food Insecurity: No Food Insecurity (10/04/2022)   Hunger Vital Sign    Worried About Running Out of Food in the Last Year: Never true    Ran Out of Food in the Last Year: Never true  Transportation Needs: No Transportation Needs (10/06/2022)   PRAPARE - Administrator, Civil Service (Medical): No    Lack of Transportation (Non-Medical): No  Physical Activity: Not on file  Stress: Not on file  Social Connections: Not on file  Intimate Partner Violence: Not At Risk (10/04/2022)   Humiliation, Afraid, Rape, and Kick questionnaire    Fear of Current or Ex-Partner: No    Emotionally Abused: No    Physically Abused: No    Sexually Abused: No      Family History  Problem Relation Age of Onset   Arthritis Mother    Diabetes Mother    Heart disease Mother    Hyperlipidemia Mother    Hypertension Mother    Arthritis Father    Asthma Father    Heart attack Father    Hyperlipidemia Father    Hypertension Father    Stroke Father    Arthritis Sister    Diabetes Sister    Hypertension Sister    Arthritis Sister    Diabetes Sister    Hyperlipidemia Sister     Hypertension Sister    Stroke Sister    Ovarian cancer Sister 62   Pancreatic cancer Sister 51   Uterine cancer Sister 60   Hyperlipidemia Sister    Hypertension Sister    Arthritis Brother    Heart attack Brother    Heart disease Brother    Hyperlipidemia Brother    Hypertension Brother    Alcohol abuse Brother    Arthritis Brother    Diabetes Brother    Early death Brother    Heart disease Brother    Hyperlipidemia Brother    Hypertension Brother    Cancer Maternal Aunt 70       unknown type   Throat cancer Maternal Grandfather        hx smoking/drinking   Hypertension Daughter    Cancer Daughter        "female cancer cells"   Kidney cancer Nephew        dx 66s   Colon cancer Neg Hx    Breast cancer Neg Hx    Prostate cancer Neg Hx     There were no vitals filed for this visit. Wt Readings from Last 3 Encounters:  10/09/22 80.1 kg (176 lb 9.4 oz)  07/31/22 86.6 kg (191 lb)  07/10/22 86.8 kg (191 lb 6.4 oz)    PHYSICAL EXAM: General:  Well appearing. No respiratory difficulty HEENT: normal Neck: supple. no JVD. Carotids 2+ bilat; no bruits. No lymphadenopathy or thryomegaly appreciated. Cor: PMI nondisplaced. Regular rate & rhythm. No rubs, gallops or murmurs. Lungs: clear Abdomen: soft, nontender, nondistended. No hepatosplenomegaly. No bruits or masses. Good bowel sounds. Extremities: no cyanosis, clubbing, rash, edema Neuro: alert & oriented x 3, cranial nerves grossly intact. moves all 4 extremities w/o difficulty. Affect pleasant.  ECG:   ASSESSMENT & PLAN: 1. Chronic HFpEF Echo EF  65-70%.  NYHA *** GDMT  Diuretic- Volume status  BB- Ace/ARB/ARNI MRA SGLT2i- Continue farxiga 10 mg daily   2. PAF  Referred to HFSW (PCP, Medications, Transportation, ETOH Abuse, Drug Abuse, Insurance, Financial ): Yes or No Refer to Pharmacy: Yes or No Refer to Home Health: Yes on No Refer to [facility]: Yes or no  Refer to General  Cardiology: Yes or No  Follow up   [name] 1:14 PM

## 2022-10-20 NOTE — Telephone Encounter (Signed)
Left message reminding patient of scheduled appointment for tomorrow. Any questions office number number given

## 2022-10-21 ENCOUNTER — Ambulatory Visit (HOSPITAL_COMMUNITY)
Admit: 2022-10-21 | Discharge: 2022-10-21 | Disposition: A | Discharge status: Home / Self Care | Payer: Medicare HMO | Attending: Adult Health | Admitting: Adult Health

## 2022-10-21 ENCOUNTER — Encounter (HOSPITAL_COMMUNITY): Payer: Self-pay

## 2022-10-21 ENCOUNTER — Telehealth: Payer: Self-pay | Admitting: *Deleted

## 2022-10-21 VITALS — BP 137/50 | HR 78 | Wt 177.8 lb

## 2022-10-21 DIAGNOSIS — I130 Hypertensive heart and chronic kidney disease with heart failure and stage 1 through stage 4 chronic kidney disease, or unspecified chronic kidney disease: Secondary | ICD-10-CM | POA: Diagnosis not present

## 2022-10-21 DIAGNOSIS — G4733 Obstructive sleep apnea (adult) (pediatric): Secondary | ICD-10-CM | POA: Diagnosis not present

## 2022-10-21 DIAGNOSIS — I480 Paroxysmal atrial fibrillation: Secondary | ICD-10-CM | POA: Insufficient documentation

## 2022-10-21 DIAGNOSIS — I5032 Chronic diastolic (congestive) heart failure: Secondary | ICD-10-CM | POA: Insufficient documentation

## 2022-10-21 DIAGNOSIS — I10 Essential (primary) hypertension: Secondary | ICD-10-CM

## 2022-10-21 DIAGNOSIS — N1832 Chronic kidney disease, stage 3b: Secondary | ICD-10-CM | POA: Insufficient documentation

## 2022-10-21 DIAGNOSIS — N179 Acute kidney failure, unspecified: Secondary | ICD-10-CM | POA: Insufficient documentation

## 2022-10-21 DIAGNOSIS — Z7901 Long term (current) use of anticoagulants: Secondary | ICD-10-CM | POA: Diagnosis not present

## 2022-10-21 DIAGNOSIS — Z79899 Other long term (current) drug therapy: Secondary | ICD-10-CM | POA: Diagnosis not present

## 2022-10-21 DIAGNOSIS — E1122 Type 2 diabetes mellitus with diabetic chronic kidney disease: Secondary | ICD-10-CM | POA: Diagnosis not present

## 2022-10-21 DIAGNOSIS — N183 Chronic kidney disease, stage 3 unspecified: Secondary | ICD-10-CM

## 2022-10-21 LAB — BASIC METABOLIC PANEL
Anion gap: 10 (ref 5–15)
BUN: 30 mg/dL — ABNORMAL HIGH (ref 8–23)
CO2: 28 mmol/L (ref 22–32)
Calcium: 9.7 mg/dL (ref 8.9–10.3)
Chloride: 98 mmol/L (ref 98–111)
Creatinine, Ser: 1.76 mg/dL — ABNORMAL HIGH (ref 0.44–1.00)
GFR, Estimated: 29 mL/min — ABNORMAL LOW (ref >60)
Glucose, Bld: 139 mg/dL — ABNORMAL HIGH (ref 70–99)
Potassium: 5 mmol/L (ref 3.5–5.1)
Sodium: 136 mmol/L (ref 135–145)

## 2022-10-21 NOTE — Telephone Encounter (Signed)
-----   Message from Manuelito N sent at 10/21/2022  3:09 PM EDT ----- Regarding: RE: appointment Pt scheduled for 03/04 at 1:20pm with Dr. Jacinto Halim ----- Message ----- From: Loa Socks, LPN Sent: 47/08/2954  12:15 PM EDT To: Conni Elliot; # Subject: FW: appointment                                Ladies, can you help with contacting the pt and getting his 3-4 month follow-up appt with Dr. Jacinto Halim scheduled, per HF Clinic?  Thanks a ton, Triage ----- Message ----- From: Baird Cancer, RN Sent: 10/21/2022  12:05 PM EDT To: Mickie Bail [address] Triage Subject: appointment                                    Hi, patient was seen in HF TOC today- patient needs to follow up with Dr. Jacinto Halim- in around 3-4 months.   Can you help arrange this?   Thank you  Jenna B. RN

## 2022-10-21 NOTE — Progress Notes (Signed)
ReDS Vest / Clip - 10/21/22 1100       ReDS Vest / Clip   Station Marker A    Ruler Value 33    ReDS Value Range Low volume    ReDS Actual Value 31

## 2022-10-21 NOTE — Patient Instructions (Signed)
Medication Changes:  No Changes In Medications at this time.   Lab Work:  Labs done today, your results will be available in MyChart, we will contact you for abnormal readings.  Follow-Up in: AS NEEDED   At the [facility], you and your health needs are our priority. We have a designated team specialized in the treatment of Heart Failure. This Care Team includes your primary Heart Failure Specialized Cardiologist (physician), Advanced Practice Providers (APPs- Physician Assistants and Nurse Practitioners), and Pharmacist who all work together to provide you with the care you need, when you need it.   You may see any of the following providers on your designated Care Team at your next follow up:  Dr. Arvilla Meres Dr. Marca Ancona Dr. Dorthula Nettles Dr. Theresia Bough Tonye Becket, NP Robbie Lis, [place] [facility] [place], [place] Brynda Peon, NP [place] Lee, NP Karle Plumber, PharmD   Please be sure to bring in all your medications bottles to every appointment.   Need to Contact us:  If you have any questions or concerns before your next appointment please send [place] a message through [place] or call our office at [phone].    TO LEAVE A MESSAGE FOR THE NURSE SELECT OPTION 2, PLEASE LEAVE A MESSAGE INCLUDING: YOUR NAME DATE OF BIRTH CALL BACK NUMBER REASON FOR CALL**this is important as we prioritize the call backs  YOU WILL RECEIVE A CALL BACK THE SAME DAY AS LONG AS YOU CALL BEFORE 4:00 PM

## 2022-10-26 DIAGNOSIS — E1165 Type 2 diabetes mellitus with hyperglycemia: Secondary | ICD-10-CM | POA: Diagnosis not present

## 2022-11-02 DIAGNOSIS — R069 Unspecified abnormalities of breathing: Secondary | ICD-10-CM | POA: Diagnosis not present

## 2022-11-02 DIAGNOSIS — I5033 Acute on chronic diastolic (congestive) heart failure: Secondary | ICD-10-CM | POA: Diagnosis not present

## 2022-11-19 ENCOUNTER — Telehealth: Payer: Self-pay

## 2022-11-19 NOTE — Telephone Encounter (Signed)
Ms. Parada called stating on 10/10/22 she was supposed to see Dr.Tucker. She was in the hospital and could not make it. She called today schedule an appointment.  Warner Mccreedy NP states ok to schedule with her on a Friday.  Pt is scheduled on 11/28/22 @ 3:00 with Warner Mccreedy NP. Pt agreed to date and time

## 2022-11-24 DIAGNOSIS — G4733 Obstructive sleep apnea (adult) (pediatric): Secondary | ICD-10-CM | POA: Diagnosis not present

## 2022-11-25 ENCOUNTER — Encounter: Payer: Self-pay | Admitting: Gynecologic Oncology

## 2022-11-26 DIAGNOSIS — E1165 Type 2 diabetes mellitus with hyperglycemia: Secondary | ICD-10-CM | POA: Diagnosis not present

## 2022-11-26 NOTE — Progress Notes (Unsigned)
Gynecologic Oncology Return Clinic Visit  11/28/2022  Reason for Visit: Follow-up in the setting of vulvar cancer    Treatment History: Oncology History  Vulvar cancer (HCC)  07/20/2020 Initial Diagnosis   Vulvar cancer (HCC)   07/26/2020 Surgery   EUA, vulvar biopsies  Findings: Pictures taken in media.  On exam, there is about a 4 x 3-1/2 cm area concerning for involvement by carcinoma that spans between the clitoris (replacing the bottom aspect of the clitoris) to just superior to the urethra.  This is a butterfly lesion that extends along the inner labia bilaterally.  Previous biopsy site is healing well with suture still intact.  There is a firmer and raised area that is approximately 1 cm around the area of the biopsy.  Biopsies taken circumferentially around the lesion noted as well as from the central aspect to help delineate what is cancer and what may be chronic dermatoses.  Given appearance today with patient more comfortable, I am suspicious that the entire lesion is carcinoma.   07/26/2020 Pathology Results   A. VULVA, 6 OCLOCK, INFERIOR ASPECT, BIOPSY:  - Superficial fragments of squamous cell carcinoma.   B. VULVA, 11 OCLOCK, BIOPSY:  - Invasive squamous cell carcinoma.  - No lymphovascular invasion.   C. VULVA, 2 OCLOCK, BIOPSY:  - Invasive squamous cell carcinoma.  - No lymphovascular invasion.   D. VULVA, CENTRAL, BIOPSY:  - Superficial fragment of squamous cell carcinoma.    08/06/2020 Imaging   PET: no evidence of metastatic disease   08/07/2020 Clinical Stage   Stage IB   08/22/2020 Surgery   Partial radical anterior vulvectomy with right inguinal SLN biopsy.  Findings: 4x4cm butterfly lesion on the anterior vulva, spanning from just superior to the clitoris to just superior to the urethral meatus. Lesion crossed the midline, more significant lesion on the right. LSG prior to surgery showed bilateral mapping. No hot or blue SLN identified on the left, mildly  blue and hot node identified as the SLN on the right. Decision made to abort left-sided LND given hypertension and difficulty ventilating. Additionally, given intra-op cardiac and respiratory status, decision made to proceed with a less radical resection (very close if not positive peri-urethral margin visibly.   08/22/2020 Pathology Results   Stage IB, lymph node assessment only on the right given comorbidities and cardiac issues during surgery  Tumor Focality:    Unifocal     Tumor Site:    Right vulva     Tumor Site:    Left vulva     Tumor Size:    Greatest Dimension (Centimeters): 4 cm     Histologic Type:    Squamous cell carcinoma, HPV-independent     Histologic Grade:    G3, poorly differentiated     Depth of Tumor Invasion:    6 mm     Tumor Border:    Infiltrating     Other Tissue / Organ Involvement:    Not applicable     Lymphovascular Invasion:    Not identified   MARGINS     Margin Status for Invasive Carcinoma:    All margins negative for invasive carcinoma       Closest Margin(s) to Invasive Carcinoma:    Peripheral: 12:00       Distance from Invasive Carcinoma to Closest Margin:    1 mm     Margin Status for HSIL (VIN2-3) or dVIN:    All margins negative for high-grade squamous intraepithelial lesion (HSIL) and /  or differentiated vulvar intraepithelial neoplasia (dVIN)   REGIONAL LYMPH NODES     Regional Lymph Node Status:           :    All regional lymph nodes negative for tumor cells       Total Number of Lymph Nodes Examined:    1         Nodal Site(s) Examined:    Right inguinal       Number of Sentinel Nodes Examined:    1    09/06/2020 Genetic Testing   Negative genetic testing:  No pathogenic variants detected on the [facility] Multi-Cancer + RNA panel. A variant of uncertain significance (VUS) was detected in the POLD1 gene called c.427G>A (p.Gly143Ser). The report date is 09/06/2020.  The Multi-Cancer + RNA Panel offered by [facility] includes sequencing and/or  deletion/duplication analysis of the following 84 genes:  AIP*, ALK, APC*, ATM*, AXIN2*, BAP1*, BARD1*, BLM*, BMPR1A*, BRCA1*, BRCA2*, BRIP1*, CASR, CDC73*, CDH1*, CDK4, CDKN1B*, CDKN1C*, CDKN2A, CEBPA, CHEK2*, CTNNA1*, DICER1*, DIS3L2*, EGFR, EPCAM, FH*, FLCN*, GATA2*, GPC3, GREM1, HOXB13, HRAS, KIT, MAX*, MEN1*, MET, MITF, MLH1*, MSH2*, MSH3*, MSH6*, MUTYH*, NBN*, NF1*, NF2*, NTHL1*, PALB2*, PDGFRA, PHOX2B, PMS2*, POLD1*, POLE*, POT1*, PRKAR1A*, PTCH1*, PTEN*, RAD50*, RAD51C*, RAD51D*, RB1*, RECQL4, RET, RUNX1*, SDHA*, SDHAF2*, SDHB*, SDHC*, SDHD*, SMAD4*, SMARCA4*, SMARCB1*, SMARCE1*, STK11*, SUFU*, TERC, TERT, TMEM127*, Tp53*, TSC1*, TSC2*, VHL*, WRN*, and WT1.  RNA analysis is performed for * genes.    10/23/2020 - 12/14/2020 Radiation Therapy   10/23/2020 through 12/14/2020 Site Technique Total Dose (Gy) Dose per Fx (Gy) Completed Fx Beam Energies  Vulva: Pelvis IMRT 50.4/50.4 1.8 28/28 6X  Vulva: Pelvis_Bst IMRT 9/9 1.8 5/5 6X         Interval History: Continues to have some irritation at her vulva where her recent biopsy was performed.  Has been using estrogen on this area and by her rectum 3 times a day.  Denies any bleeding.  Reports significant improvement in her bowel function now that she is taking Imodium 4 times a day.  Denies urinary symptoms.  Past Medical/Surgical History: Past Medical History:  Diagnosis Date   Arthritis    CAD (coronary artery disease)    Diabetes mellitus without complication (HCC)    Diastolic heart failure (HCC)    Dyspnea    Encounter for care of pacemaker 09/03/2018   Family history of kidney cancer    Family history of throat cancer    Family history of uterine cancer    History of chickenpox    History of radiation therapy    Pelvis 10/23/20-12/14/20- Dr. Antony Blackbird   Hx of psoriatic arthritis    Hyperlipemia    Hypertension    Hypertension 05/03/2018   Hypothyroidism    Mitral valve disorders(424.0)    Myocardial infarction (HCC)     Obstructive sleep apnea    OSA (obstructive sleep apnea)    using BiPAP   Pacemaker    Paroxysmal atrial fibrillation (HCC) 10/11/2016   Peripheral neuropathy    Renal disorder Kidney disease stage2   Sick sinus syndrome [facility])    S/p dual-chamber pacemaker   Thyroid disease hypothyroid   Urine incontinence    Vitamin D deficiency    Vulvar cancer (HCC) 2022   s/p surgery and radiation therapy October-December 2022    Past Surgical History:  Procedure Laterality Date   BACK SURGERY     BACK SURGERY     BIOPSY  11/20/2021   Procedure: BIOPSY;  Surgeon: Eula Listen  M, MD;  Location: [facility];  Service: Endoscopy;;   CARDIAC CATHETERIZATION N/A 04/29/2015   Procedure: Temporary Pacemaker;  Surgeon: Yates Decamp, MD;  Location: [facility] INVASIVE CV LAB;  Service: Cardiovascular;  Laterality: N/A;   CARDIOVERSION N/A 09/07/2018   Procedure: CARDIOVERSION;  Surgeon: Yates Decamp, MD;  Location: [facility] ENDOSCOPY;  Service: Cardiovascular;  Laterality: N/A;   CARDIOVERSION     COLONOSCOPY  08/16/2008   Dr. Jena Gauss; pancolonic diverticulosis, otherwise no abnormalities.  Consider repeat in 10 years.   COLONOSCOPY WITH PROPOFOL N/A 11/20/2021   Procedure: COLONOSCOPY WITH PROPOFOL;  Surgeon: Corbin Ade, MD;  Location: [facility];  Service: Endoscopy;  Laterality: N/A;  8:15am, asa 3   CORONARY PRESSURE/FFR STUDY N/A 08/31/2017   Procedure: INTRAVASCULAR PRESSURE WIRE/FFR STUDY;  Surgeon: Elder Negus, MD;  Location: [facility] INVASIVE CV LAB;  Service: Cardiovascular;  Laterality: N/A;   EP IMPLANTABLE DEVICE N/A 04/30/2015   Procedure: Pacemaker Implant;  Surgeon: Marinus Maw, MD;  Location: [facility] INVASIVE CV LAB;  Service: Cardiovascular;  Laterality: N/A;   LEFT AND RIGHT HEART CATHETERIZATION WITH CORONARY ANGIOGRAM N/A 09/30/2011   Procedure: LEFT AND RIGHT HEART CATHETERIZATION WITH CORONARY ANGIOGRAM;  Surgeon: Pamella Pert, MD;  Location: [facility] CATH LAB;  Service: Cardiovascular;   Laterality: N/A;   POLYPECTOMY  11/20/2021   Procedure: POLYPECTOMY;  Surgeon: Corbin Ade, MD;  Location: [facility];  Service: Endoscopy;;   RIGHT/LEFT HEART CATH AND CORONARY ANGIOGRAPHY N/A 08/31/2017   Procedure: RIGHT/LEFT HEART CATH AND CORONARY ANGIOGRAPHY;  Surgeon: Elder Negus, MD;  Location: [facility] INVASIVE CV LAB;  Service: Cardiovascular;  Laterality: N/A;   TOOTH EXTRACTION  10/07/2018   TUBAL LIGATION     VULVA Ples Specter BIOPSY N/A 07/26/2020   Procedure: VULVAR BIOPSY;  Surgeon: Carver Fila, MD;  Location: [facility] ORS;  Service: Gynecology;  Laterality: N/A;   YAG LASER APPLICATION Right 03/07/2014   Procedure: YAG LASER APPLICATION;  Surgeon: Loraine Leriche T. Nile Riggs, MD;  Location: [facility] ORS;  Service: Ophthalmology;  Laterality: Right;  pt knows to arrive at 11:15   YAG LASER APPLICATION Left 03/21/2014   Procedure: YAG LASER APPLICATION;  Surgeon: Jethro Bolus, MD;  Location: [facility] ORS;  Service: Ophthalmology;  Laterality: Left;    Family History  Problem Relation Age of Onset   Arthritis Mother    Diabetes Mother    Heart disease Mother    Hyperlipidemia Mother    Hypertension Mother    Arthritis Father    Asthma Father    Heart attack Father    Hyperlipidemia Father    Hypertension Father    Stroke Father    Arthritis Sister    Diabetes Sister    Hypertension Sister    Arthritis Sister    Diabetes Sister    Hyperlipidemia Sister    Hypertension Sister    Stroke Sister    Ovarian cancer Sister 27   Pancreatic cancer Sister 40   Uterine cancer Sister 52   Hyperlipidemia Sister    Hypertension Sister    Arthritis Brother    Heart attack Brother    Heart disease Brother    Hyperlipidemia Brother    Hypertension Brother    Alcohol abuse Brother    Arthritis Brother    Diabetes Brother    Early death Brother    Heart disease Brother    Hyperlipidemia Brother    Hypertension Brother    Cancer Maternal Aunt 21  unknown type   Throat cancer  Maternal Grandfather        hx smoking/drinking   Hypertension Daughter    Cancer Daughter        "female cancer cells"   Kidney cancer Nephew        dx 30s   Colon cancer Neg Hx    Breast cancer Neg Hx    Prostate cancer Neg Hx     Social History   Socioeconomic History   Marital status: Widowed    Spouse name: Not on file   Number of children: 3   Years of education: Not on file   Highest education level: Not on file  Occupational History   Occupation: Retired  Tobacco Use   Smoking status: Never   Smokeless tobacco: Never  Vaping Use   Vaping status: Never Used  Substance and Sexual Activity   Alcohol use: No    Alcohol/week: 0.0 standard drinks of alcohol   Drug use: No   Sexual activity: Not Currently    Birth control/protection: Post-menopausal  Other Topics Concern   Not on file  Social History Narrative   Husband recently passed away on May 02, 2020.   Social Determinants of Health   Financial Resource Strain: Low Risk  (10/06/2022)   Overall Financial Resource Strain (CARDIA)    Difficulty of Paying Living Expenses: Not hard at all  Food Insecurity: No Food Insecurity (10/04/2022)   Hunger Vital Sign    Worried About Running Out of Food in the Last Year: Never true    Ran Out of Food in the Last Year: Never true  Transportation Needs: No Transportation Needs (10/06/2022)   PRAPARE - Administrator, Civil Service (Medical): No    Lack of Transportation (Non-Medical): No  Physical Activity: Not on file  Stress: Not on file  Social Connections: Not on file    Current Medications:  Current Outpatient Medications:    amiodarone (PACERONE) 200 MG tablet, Take 1/2 (one-half) tablet by mouth once daily (Patient taking differently: Take 100 mg by mouth daily.), Disp: 45 tablet, Rfl: 3   apixaban (ELIQUIS) 5 MG TABS tablet, Take 1 tablet (5 mg total) by mouth 2 (two) times daily., Disp: 180 tablet, Rfl: 3   Blood Glucose Monitoring Suppl  (ACCU-CHEK AVIVA PLUS) w/Device KIT, , Disp: , Rfl:    cholecalciferol (VITAMIN D3) 25 MCG (1000 UNIT) tablet, Take 2,000 Units by mouth in the morning., Disp: , Rfl:    clobetasol cream (TEMOVATE) 0.05 %, Apply 1 Application topically 2 (two) times daily as needed (rash)., Disp: , Rfl:    Continuous Glucose Sensor (DEXCOM G7 SENSOR) MISC, , Disp: , Rfl:    dapagliflozin propanediol (FARXIGA) 10 MG TABS tablet, Take 1 tablet (10 mg total) by mouth daily before breakfast., Disp: 90 tablet, Rfl: 3   fenofibrate 160 MG tablet, Take 160 mg by mouth daily with supper. , Disp: , Rfl:    insulin aspart protamine- aspart (NOVOLOG MIX 70/30) (70-30) 100 UNIT/ML injection, Inject 0.1-0.2 mLs (10-20 Units total) into the skin See admin instructions. Inject 20 units subcutaneously with breakfast & inject 10 units subcutaneously with supper (may adjust based on blood sugar readings), Disp: , Rfl:    isosorbide mononitrate (IMDUR) 60 MG 24 hr tablet, Take 60 mg by mouth daily., Disp: , Rfl:    levothyroxine (SYNTHROID) 137 MCG tablet, Take 137 mcg by mouth daily before breakfast., Disp: , Rfl:    lidocaine (XYLOCAINE) 5 %  ointment, Apply 1 Application topically 3 (three) times daily as needed. (Patient taking differently: Apply 1 Application topically 3 (three) times daily as needed (psoriatic arthritis).), Disp: 35.44 g, Rfl: 0   loperamide (IMODIUM) 2 MG capsule, Take 4 mg by mouth 2 (two) times daily., Disp: , Rfl:    metoprolol tartrate (LOPRESSOR) 50 MG tablet, Take 1 tablet by mouth twice daily (Patient taking differently: Take 50 mg by mouth 2 (two) times daily.), Disp: 180 tablet, Rfl: 0   nitroGLYCERIN (NITROSTAT) 0.4 MG SL tablet, Place 1 tablet (0.4 mg total) under the tongue every 5 (five) minutes as needed for chest pain., Disp: 25 tablet, Rfl: 1   olmesartan (BENICAR) 40 MG tablet, Take 40 mg by mouth daily., Disp: , Rfl:    spironolactone (ALDACTONE) 25 MG tablet, Take 1 tablet by mouth once daily  (Patient taking differently: Take 25 mg by mouth at bedtime.), Disp: 90 tablet, Rfl: 0   torsemide (DEMADEX) 10 MG tablet, Alternate 10mg  with 20mg  daily, Disp: , Rfl:   Review of Systems: + shortness of breath, incontinence, joint pain, back pain. Denies appetite changes, fevers, chills, fatigue, unexplained weight changes. Denies hearing loss, neck lumps or masses, mouth sores, ringing in ears or voice changes. Denies cough or wheezing.   Denies chest pain or palpitations. Denies leg swelling. Denies abdominal distention, pain, blood in stools, constipation, diarrhea, nausea, vomiting, or early satiety. Denies pain with intercourse, dysuria, frequency, hematuria. Denies hot flashes, pelvic pain, vaginal bleeding or vaginal discharge.   Denies muscle pain/cramps. Denies itching, rash, or wounds. Denies dizziness, headaches, numbness or seizures. Denies swollen lymph nodes or glands, denies easy bruising or bleeding. Denies anxiety, depression, confusion, or decreased concentration.  Physical Exam: There were no vitals taken for this visit. General: Alert, oriented, no acute distress. HEENT: Posterior oropharynx clear, sclera anicteric. Chest: Clear to auscultation bilaterally.  No wheezes or rhonchi. Cardiovascular: Regular rate and rhythm, no murmurs. Abdomen: Obese, soft, nontender.  Normoactive bowel sounds.  No masses or hepatosplenomegaly appreciated.  Extremities: Grossly normal range of motion.  Warm, well perfused.  Trace edema bilaterally. Skin: No rashes or lesions noted. Lymphatics: No cervical, supraclavicular, or inguinal adenopathy. GU: External female genitalia atrophic with radiation changes present especially along the perineum and posterior fourchette.  No inguinal adenopathy appreciated.  There is an area of leukoplakia anteriorly as well as some leukoplakia along the perineum, both stable from last visit.  After application of acetic acid, no acetowhite changes, no  change to leukoplakia.  Recent biopsy site noted, stitch removed today.  There is some mild skin breakdown around this area.  Laboratory & Radiologic Studies: 06/18/22: A. VULVA BIOPSY:  Ulcer with mild inflammation and focal subepidermal fibrosis.  Negative for dysplasia or malignancy.  See comment.   The biopsy shows crush artifact with a focal area of ulceration and adjacent intact epidermis shows some epidermal fibrosis strongly  suggestive of lichen sclerosus.  PAS stain is negative for fungus and  p53 immunohistochemistry is negative.   Assessment & Plan: [name] is a 77 y.o. 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 Completed adjuvant radiation in 12/2020.  Patient is overall doing well.  She continues to have several spots consistent with lichen sclerosus.  Has been having more symptoms related to her anterior lesion which was recently biopsied and showed no dysplasia or malignancy but findings consistent with lichen sclerosus.  The patient has  not had significant improvement with use of estrogen on her vulva.  We discussed stopping this and restarting clobetasol twice a week as well as using Vaseline and topical lidocaine.  Prescription for lidocaine sent to her pharmacy today.   Patient's diarrhea has significantly improved although she had fecal incontinence today at her visit.    Per NCCN surveillance recommendations, we will continue with visits every 3 months.  She continues to prefer to have this done solely in our clinic.  We reviewed signs and symptoms that would be concerning for disease recurrence, and I have stressed the importance of her calling if she develops any of these between visits.  22 minutes of total time was spent for this patient encounter, including preparation, face-to-face counseling with the patient and coordination of care, and documentation of the encounter.  Warner Mccreedy NP [facility]  GYN Oncology

## 2022-11-27 ENCOUNTER — Inpatient Hospital Stay: Payer: Medicare HMO | Attending: Gynecologic Oncology | Admitting: Gynecologic Oncology

## 2022-11-27 VITALS — BP 141/56 | HR 80 | Temp 98.1°F | Resp 18 | Wt 177.0 lb

## 2022-11-27 DIAGNOSIS — Z8544 Personal history of malignant neoplasm of other female genital organs: Secondary | ICD-10-CM | POA: Diagnosis not present

## 2022-11-27 DIAGNOSIS — Z923 Personal history of irradiation: Secondary | ICD-10-CM | POA: Insufficient documentation

## 2022-11-27 DIAGNOSIS — C519 Malignant neoplasm of vulva, unspecified: Secondary | ICD-10-CM

## 2022-11-27 DIAGNOSIS — N904 Leukoplakia of vulva: Secondary | ICD-10-CM | POA: Diagnosis not present

## 2022-11-27 NOTE — Patient Instructions (Signed)
No concerning findings for cancer return on your vulva. Your exam looks stable.   You can use the topical lidocaine gel to the vulva for discomfort as needed.   Plan on following up in three months or sooner if needed.  Would recommend reaching out to your gastroenterologist about the diarrhea with little change from imodium.

## 2022-11-27 NOTE — Progress Notes (Unsigned)
Gynecologic Oncology Return Clinic Visit  11/27/2022  Reason for Visit: Follow-up in the setting of vulvar cancer    Treatment History: Oncology History  Vulvar cancer (HCC)  07/20/2020 Initial Diagnosis   Vulvar cancer (HCC)   07/26/2020 Surgery   EUA, vulvar biopsies  Findings: Pictures taken in media.  On exam, there is about a 4 x 3-1/2 cm area concerning for involvement by carcinoma that spans between the clitoris (replacing the bottom aspect of the clitoris) to just superior to the urethra.  This is a butterfly lesion that extends along the inner labia bilaterally.  Previous biopsy site is healing well with suture still intact.  There is a firmer and raised area that is approximately 1 cm around the area of the biopsy.  Biopsies taken circumferentially around the lesion noted as well as from the central aspect to help delineate what is cancer and what may be chronic dermatoses.  Given appearance today with patient more comfortable, I am suspicious that the entire lesion is carcinoma.   07/26/2020 Pathology Results   A. VULVA, 6 OCLOCK, INFERIOR ASPECT, BIOPSY:  - Superficial fragments of squamous cell carcinoma.   B. VULVA, 11 OCLOCK, BIOPSY:  - Invasive squamous cell carcinoma.  - No lymphovascular invasion.   C. VULVA, 2 OCLOCK, BIOPSY:  - Invasive squamous cell carcinoma.  - No lymphovascular invasion.   D. VULVA, CENTRAL, BIOPSY:  - Superficial fragment of squamous cell carcinoma.    08/06/2020 Imaging   PET: no evidence of metastatic disease   08/07/2020 Clinical Stage   Stage IB   08/22/2020 Surgery   Partial radical anterior vulvectomy with right inguinal SLN biopsy.  Findings: 4x4cm butterfly lesion on the anterior vulva, spanning from just superior to the clitoris to just superior to the urethral meatus. Lesion crossed the midline, more significant lesion on the right. LSG prior to surgery showed bilateral mapping. No hot or blue SLN identified on the left, mildly  blue and hot node identified as the SLN on the right. Decision made to abort left-sided LND given hypertension and difficulty ventilating. Additionally, given intra-op cardiac and respiratory status, decision made to proceed with a less radical resection (very close if not positive peri-urethral margin visibly.   08/22/2020 Pathology Results   Stage IB, lymph node assessment only on the right given comorbidities and cardiac issues during surgery  Tumor Focality:    Unifocal     Tumor Site:    Right vulva     Tumor Site:    Left vulva     Tumor Size:    Greatest Dimension (Centimeters): 4 cm     Histologic Type:    Squamous cell carcinoma, HPV-independent     Histologic Grade:    G3, poorly differentiated     Depth of Tumor Invasion:    6 mm     Tumor Border:    Infiltrating     Other Tissue / Organ Involvement:    Not applicable     Lymphovascular Invasion:    Not identified   MARGINS     Margin Status for Invasive Carcinoma:    All margins negative for invasive carcinoma       Closest Margin(s) to Invasive Carcinoma:    Peripheral: 12:00       Distance from Invasive Carcinoma to Closest Margin:    1 mm     Margin Status for HSIL (VIN2-3) or dVIN:    All margins negative for high-grade squamous intraepithelial lesion (HSIL) and /  or differentiated vulvar intraepithelial neoplasia (dVIN)   REGIONAL LYMPH NODES     Regional Lymph Node Status:           :    All regional lymph nodes negative for tumor cells       Total Number of Lymph Nodes Examined:    1         Nodal Site(s) Examined:    Right inguinal       Number of Sentinel Nodes Examined:    1    09/06/2020 Genetic Testing   Negative genetic testing:  No pathogenic variants detected on the [facility] Multi-Cancer + RNA panel. A variant of uncertain significance (VUS) was detected in the POLD1 gene called c.427G>A (p.Gly143Ser). The report date is 09/06/2020.  The Multi-Cancer + RNA Panel offered by [facility] includes sequencing and/or  deletion/duplication analysis of the following 84 genes:  AIP*, ALK, APC*, ATM*, AXIN2*, BAP1*, BARD1*, BLM*, BMPR1A*, BRCA1*, BRCA2*, BRIP1*, CASR, CDC73*, CDH1*, CDK4, CDKN1B*, CDKN1C*, CDKN2A, CEBPA, CHEK2*, CTNNA1*, DICER1*, DIS3L2*, EGFR, EPCAM, FH*, FLCN*, GATA2*, GPC3, GREM1, HOXB13, HRAS, KIT, MAX*, MEN1*, MET, MITF, MLH1*, MSH2*, MSH3*, MSH6*, MUTYH*, NBN*, NF1*, NF2*, NTHL1*, PALB2*, PDGFRA, PHOX2B, PMS2*, POLD1*, POLE*, POT1*, PRKAR1A*, PTCH1*, PTEN*, RAD50*, RAD51C*, RAD51D*, RB1*, RECQL4, RET, RUNX1*, SDHA*, SDHAF2*, SDHB*, SDHC*, SDHD*, SMAD4*, SMARCA4*, SMARCB1*, SMARCE1*, STK11*, SUFU*, TERC, TERT, TMEM127*, Tp53*, TSC1*, TSC2*, VHL*, WRN*, and WT1.  RNA analysis is performed for * genes.    10/23/2020 - 12/14/2020 Radiation Therapy   10/23/2020 through 12/14/2020 Site Technique Total Dose (Gy) Dose per Fx (Gy) Completed Fx Beam Energies  Vulva: Pelvis IMRT 50.4/50.4 1.8 28/28 6X  Vulva: Pelvis_Bst IMRT 9/9 1.8 5/5 6X         Interval History: Patient presents to the office for continued follow up. She reports having a decreased appetite at times with hypoglycemic episodes last pm and this am. No nausea or emesis. She has a continuous glucose monitor on. She continues to have chronic loose stools. She takes 4 imodium a day and is planning on following up with her GI doc due to little change in symptoms with imodium recently. No vaginal bleeding or discharge noted. Has urinary incontinence and has to to wear a pad all the time. Feels she has constant vulvar soreness from this and the diarrhea. She did not feel much of a change with clobetasol cream. Uses A&D ointment as a barrier. She is asking about treatment for the soreness. No lower extremity edema reported. Feels fatigued at times. She reports mild abd bloating after eating. Lower abdominal ache intermittently. Has incontinent episdoes fo diarrhea a times due to urgency and loose nature of stools. She was recently admitted for CHF from  9/21-9/26. No other concerns voiced or symptoms of recurrence voiced.  Past Medical/Surgical History: Past Medical History:  Diagnosis Date   Arthritis    CAD (coronary artery disease)    Diabetes mellitus without complication (HCC)    Diastolic heart failure (HCC)    Dyspnea    Encounter for care of pacemaker 09/03/2018   Family history of kidney cancer    Family history of throat cancer    Family history of uterine cancer    History of chickenpox    History of radiation therapy    Pelvis 10/23/20-12/14/20- Dr. Antony Blackbird   Hx of psoriatic arthritis    Hyperlipemia    Hypertension    Hypertension 05/03/2018   Hypothyroidism    Mitral valve disorders(424.0)    Myocardial infarction (HCC)  Obstructive sleep apnea    OSA (obstructive sleep apnea)    using BiPAP   Pacemaker    Paroxysmal atrial fibrillation (HCC) 10/11/2016   Peripheral neuropathy    Renal disorder Kidney disease stage2   Sick sinus syndrome [facility])    S/p dual-chamber pacemaker   Thyroid disease hypothyroid   Urine incontinence    Vitamin D deficiency    Vulvar cancer (HCC) 2022   s/p surgery and radiation therapy October-December 2022    Past Surgical History:  Procedure Laterality Date   BACK SURGERY     BACK SURGERY     BIOPSY  11/20/2021   Procedure: BIOPSY;  Surgeon: Corbin Ade, MD;  Location: [facility];  Service: Endoscopy;;   CARDIAC CATHETERIZATION N/A 04/29/2015   Procedure: Temporary Pacemaker;  Surgeon: Yates Decamp, MD;  Location: [facility] INVASIVE CV LAB;  Service: Cardiovascular;  Laterality: N/A;   CARDIOVERSION N/A 09/07/2018   Procedure: CARDIOVERSION;  Surgeon: Yates Decamp, MD;  Location: [facility] ENDOSCOPY;  Service: Cardiovascular;  Laterality: N/A;   CARDIOVERSION     COLONOSCOPY  08/16/2008   Dr. Jena Gauss; pancolonic diverticulosis, otherwise no abnormalities.  Consider repeat in 10 years.   COLONOSCOPY WITH PROPOFOL N/A 11/20/2021   Procedure: COLONOSCOPY WITH PROPOFOL;  Surgeon:  Corbin Ade, MD;  Location: [facility];  Service: Endoscopy;  Laterality: N/A;  8:15am, asa 3   CORONARY PRESSURE/FFR STUDY N/A 08/31/2017   Procedure: INTRAVASCULAR PRESSURE WIRE/FFR STUDY;  Surgeon: Elder Negus, MD;  Location: [facility] INVASIVE CV LAB;  Service: Cardiovascular;  Laterality: N/A;   EP IMPLANTABLE DEVICE N/A 04/30/2015   Procedure: Pacemaker Implant;  Surgeon: Marinus Maw, MD;  Location: [facility] INVASIVE CV LAB;  Service: Cardiovascular;  Laterality: N/A;   LEFT AND RIGHT HEART CATHETERIZATION WITH CORONARY ANGIOGRAM N/A 09/30/2011   Procedure: LEFT AND RIGHT HEART CATHETERIZATION WITH CORONARY ANGIOGRAM;  Surgeon: Pamella Pert, MD;  Location: [facility] CATH LAB;  Service: Cardiovascular;  Laterality: N/A;   POLYPECTOMY  11/20/2021   Procedure: POLYPECTOMY;  Surgeon: Corbin Ade, MD;  Location: [facility];  Service: Endoscopy;;   RIGHT/LEFT HEART CATH AND CORONARY ANGIOGRAPHY N/A 08/31/2017   Procedure: RIGHT/LEFT HEART CATH AND CORONARY ANGIOGRAPHY;  Surgeon: Elder Negus, MD;  Location: [facility] INVAS[facility] CV LAB;  Service: Cardiovascular;  Laterality: N/A;   TOOTH EXTRACTION  10/07/2018   TUBAL LIGATION     VULVA Ples Specter BIOPSY N/A 07/26/2020   Procedure: VULVAR BIOPSY;  Surgeon: Carver Fila, MD;  Location: [facility] ORS;  Service: Gynecology;  Laterality: N/A;   YAG LASER APPLICATION Right 03/07/2014   Procedure: YAG LASER APPLICATION;  Surgeon: Loraine Leriche T. Nile Riggs, MD;  Location: [facility] ORS;  Service: Ophthalmology;  Laterality: Right;  pt knows to arrive at 11:15   YAG LASER APPLICATION Left 03/21/2014   Procedure: YAG LASER APPLICATION;  Surgeon: Jethro Bolus, MD;  Location: [facility] ORS;  Service: Ophthalmology;  Laterality: Left;    Family History  Problem Relation Age of Onset   Arthritis Mother    Diabetes Mother    Heart disease Mother    Hyperlipidemia Mother    Hypertension Mother    Arthritis Father    Asthma Father    Heart attack Father     Hyperlipidemia Father    Hypertension Father    Stroke Father    Arthritis Sister    Diabetes Sister    Hypertension Sister    Arthritis Sister    Diabetes Sister  Hyperlipidemia Sister    Hypertension Sister    Stroke Sister    Ovarian cancer Sister 44   Pancreatic cancer Sister 53   Uterine cancer Sister 77   Hyperlipidemia Sister    Hypertension Sister    Arthritis Brother    Heart attack Brother    Heart disease Brother    Hyperlipidemia Brother    Hypertension Brother    Alcohol abuse Brother    Arthritis Brother    Diabetes Brother    Early death Brother    Heart disease Brother    Hyperlipidemia Brother    Hypertension Brother    Cancer Maternal Aunt 58       unknown type   Throat cancer Maternal Grandfather        hx smoking/drinking   Hypertension Daughter    Cancer Daughter        "female cancer cells"   Kidney cancer Nephew        dx 16s   Colon cancer Neg Hx    Breast cancer Neg Hx    Prostate cancer Neg Hx     Social History   Socioeconomic History   Marital status: Widowed    Spouse name: Not on file   Number of children: 3   Years of education: Not on file   Highest education level: Not on file  Occupational History   Occupation: Retired  Tobacco Use   Smoking status: Never   Smokeless tobacco: Never  Vaping Use   Vaping status: Never Used  Substance and Sexual Activity   Alcohol use: No    Alcohol/week: 0.0 standard drinks of alcohol   Drug use: No   Sexual activity: Not Currently    Birth control/protection: Post-menopausal  Other Topics Concern   Not on file  Social History Narrative   Husband recently passed away on 05-May-2020.   Social Determinants of Health   Financial Resource Strain: Low Risk  (10/06/2022)   Overall Financial Resource Strain (CARDIA)    Difficulty of Paying Living Expenses: Not hard at all  Food Insecurity: No Food Insecurity (10/04/2022)   Hunger Vital Sign    Worried About Running Out of Food in  the Last Year: Never true    Ran Out of Food in the Last Year: Never true  Transportation Needs: No Transportation Needs (10/06/2022)   PRAPARE - Administrator, Civil Service (Medical): No    Lack of Transportation (Non-Medical): No  Physical Activity: Not on file  Stress: Not on file  Social Connections: Not on file    Current Medications:  Current Outpatient Medications:    amiodarone (PACERONE) 200 MG tablet, Take 1/2 (one-half) tablet by mouth once daily (Patient taking differently: Take 100 mg by mouth daily.), Disp: 45 tablet, Rfl: 3   apixaban (ELIQUIS) 5 MG TABS tablet, Take 1 tablet (5 mg total) by mouth 2 (two) times daily., Disp: 180 tablet, Rfl: 3   Blood Glucose Monitoring Suppl (ACCU-CHEK AVIVA PLUS) w/Device KIT, , Disp: , Rfl:    cholecalciferol (VITAMIN D3) 25 MCG (1000 UNIT) tablet, Take 2,000 Units by mouth in the morning., Disp: , Rfl:    clobetasol cream (TEMOVATE) 0.05 %, Apply 1 Application topically 2 (two) times daily as needed (rash)., Disp: , Rfl:    Continuous Glucose Sensor (DEXCOM G7 SENSOR) MISC, , Disp: , Rfl:    dapagliflozin propanediol (FARXIGA) 10 MG TABS tablet, Take 1 tablet (10 mg total) by mouth daily before breakfast., Disp:  90 tablet, Rfl: 3   fenofibrate 160 MG tablet, Take 160 mg by mouth daily with supper. , Disp: , Rfl:    insulin aspart protamine- aspart (NOVOLOG MIX 70/30) (70-30) 100 UNIT/ML injection, Inject 0.1-0.2 mLs (10-20 Units total) into the skin See admin instructions. Inject 20 units subcutaneously with breakfast & inject 10 units subcutaneously with supper (may adjust based on blood sugar readings), Disp: , Rfl:    isosorbide mononitrate (IMDUR) 60 MG 24 hr tablet, Take 60 mg by mouth daily., Disp: , Rfl:    levothyroxine (SYNTHROID) 137 MCG tablet, Take 137 mcg by mouth daily before breakfast., Disp: , Rfl:    lidocaine (XYLOCAINE) 5 % ointment, Apply 1 Application topically 3 (three) times daily as needed. (Patient  taking differently: Apply 1 Application topically 3 (three) times daily as needed (psoriatic arthritis).), Disp: 35.44 g, Rfl: 0   loperamide (IMODIUM) 2 MG capsule, Take 4 mg by mouth 2 (two) times daily., Disp: , Rfl:    metoprolol tartrate (LOPRESSOR) 50 MG tablet, Take 1 tablet by mouth twice daily (Patient taking differently: Take 50 mg by mouth 2 (two) times daily.), Disp: 180 tablet, Rfl: 0   nitroGLYCERIN (NITROSTAT) 0.4 MG SL tablet, Place 1 tablet (0.4 mg total) under the tongue every 5 (five) minutes as needed for chest pain., Disp: 25 tablet, Rfl: 1   olmesartan (BENICAR) 40 MG tablet, Take 40 mg by mouth daily., Disp: , Rfl:    spironolactone (ALDACTONE) 25 MG tablet, Take 1 tablet by mouth once daily (Patient taking differently: Take 25 mg by mouth at bedtime.), Disp: 90 tablet, Rfl: 0   torsemide (DEMADEX) 10 MG tablet, Alternate 10mg  with 20mg  daily, Disp: , Rfl:   Review of Systems: On ROS intake form: + incontience. Denies fevers, chills, fatigue, unexplained weight changes. +Decreased appetite Denies new hearing loss, neck lumps or masses, mouth sores, ringing in ears or voice changes. Denies new cough or wheezing.   Denies new chest pain or palpitations. Denies leg swelling. Denies new abdominal distention, blood in stools, constipation, nausea, vomiting, or early satiety. +diarrhea, lower abd ache intermittently Denies dysuria, frequency, hematuria. +urinary incontinence Denies new hot flashes, pelvic pain, vaginal bleeding or vaginal discharge.   Denies new muscle pain/cramps. +chronic back pain Denies new itching, rash, or wounds. Denies new dizziness, headaches, numbness or seizures. Denies new swollen lymph nodes or glands, denies easy bruising or bleeding. Denies new symptoms of anxiety, depression, confusion, or decreased concentration.  Physical Exam: BP (!) 141/56 (BP Location: Right Arm)   Pulse 80   Temp 98.1 F (36.7 C) (Oral)   Resp 18   Wt 177 lb (80.3  kg)   SpO2 96%   BMI 32.37 kg/m  General: Alert, oriented, no acute distress. HEENT: Posterior oropharynx clear, sclera anicteric. Chest: Clear to auscultation bilaterally.  No wheezes or rhonchi. Cardiovascular: Regular rate and rhythm, no murmurs. Abdomen: Obese, soft, nontender.  Normoactive bowel sounds.  No masses or hepatosplenomegaly appreciated.  Extremities: Grossly normal range of motion.  Warm, well perfused.  No edema bilaterally. Skin: No rashes or lesions noted. Lymphatics: No cervical, supraclavicular, or inguinal adenopathy. GU: External female genitalia atrophic with radiation changes present especially along the perineum and posterior fourchette.  No inguinal adenopathy appreciated.  There is an area of leukoplakia anteriorly as well as some leukoplakia along the perineum, both stable from last visit.  After application of acetic acid, no acetowhite changes, no change to leukoplakia. Dr. Pricilla Holm to the room, exam  stable compared with previous. Photo taken under media for comparison at future visits.    Laboratory & Radiologic Studies: 06/18/22: A. VULVA BIOPSY:  Ulcer with mild inflammation and focal subepidermal fibrosis. Negative for dysplasia or malignancy. See comment.   The biopsy shows crush artifact with a focal area of ulceration and adjacent intact epidermis shows some epidermal fibrosis strongly  suggestive of lichen sclerosus.  PAS stain is negative for fungus and p53 immunohistochemistry is negative.   Assessment & Plan: [name] is a 77 y.o. 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 Completed adjuvant radiation in 12/2020.  Patient is overall doing well.  She continues to have several spots consistent with lichen sclerosus-stable. She is planning on following up with her GI provider about her loose stools and little change with imodium recently. Discussed use of topical lidocaine for symptoms as  needed.    Per NCCN surveillance recommendations, we will continue with visits every 3 months. We reviewed signs and symptoms that would be concerning for disease recurrence, and I have stressed the importance of her calling if she develops any of these between visits.  20 minutes of total time was spent for this patient encounter, including preparation, face-to-face counseling with the patient and coordination of care, and documentation of the encounter.  Warner Mccreedy NP [facility]

## 2022-11-28 ENCOUNTER — Ambulatory Visit: Payer: Medicare HMO | Admitting: Gynecologic Oncology

## 2022-11-28 DIAGNOSIS — C519 Malignant neoplasm of vulva, unspecified: Secondary | ICD-10-CM

## 2022-12-03 DIAGNOSIS — I5033 Acute on chronic diastolic (congestive) heart failure: Secondary | ICD-10-CM | POA: Diagnosis not present

## 2022-12-03 DIAGNOSIS — R069 Unspecified abnormalities of breathing: Secondary | ICD-10-CM | POA: Diagnosis not present

## 2022-12-09 ENCOUNTER — Ambulatory Visit: Payer: Medicare HMO | Admitting: Internal Medicine

## 2022-12-16 ENCOUNTER — Ambulatory Visit: Payer: Medicare HMO | Admitting: Internal Medicine

## 2022-12-26 DIAGNOSIS — E1165 Type 2 diabetes mellitus with hyperglycemia: Secondary | ICD-10-CM | POA: Diagnosis not present

## 2022-12-29 ENCOUNTER — Other Ambulatory Visit: Payer: Self-pay | Admitting: Cardiology

## 2022-12-30 ENCOUNTER — Ambulatory Visit (INDEPENDENT_AMBULATORY_CARE_PROVIDER_SITE_OTHER): Payer: Medicare HMO

## 2022-12-30 DIAGNOSIS — I5032 Chronic diastolic (congestive) heart failure: Secondary | ICD-10-CM

## 2023-01-02 DIAGNOSIS — I5033 Acute on chronic diastolic (congestive) heart failure: Secondary | ICD-10-CM | POA: Diagnosis not present

## 2023-01-02 DIAGNOSIS — R069 Unspecified abnormalities of breathing: Secondary | ICD-10-CM | POA: Diagnosis not present

## 2023-01-05 LAB — CUP PACEART REMOTE DEVICE CHECK
Battery Impedance: 667 Ohm
Battery Remaining Longevity: 85 mo
Battery Voltage: 2.78 V
Brady Statistic AP VP Percent: 0 %
Brady Statistic AP VS Percent: 100 %
Brady Statistic AS VP Percent: 0 %
Brady Statistic AS VS Percent: 0 %
Date Time Interrogation Session: 20241223100534
Implantable Lead Connection Status: 753985
Implantable Lead Connection Status: 753985
Implantable Lead Implant Date: 20170417
Implantable Lead Implant Date: 20170417
Implantable Lead Location: 753859
Implantable Lead Location: 753860
Implantable Lead Model: 5076
Implantable Lead Model: 5076
Implantable Pulse Generator Implant Date: 20170417
Lead Channel Impedance Value: 413 Ohm
Lead Channel Impedance Value: 503 Ohm
Lead Channel Pacing Threshold Amplitude: 0.5 V
Lead Channel Pacing Threshold Amplitude: 0.75 V
Lead Channel Pacing Threshold Pulse Width: 0.4 ms
Lead Channel Pacing Threshold Pulse Width: 0.4 ms
Lead Channel Setting Pacing Amplitude: 1.5 V
Lead Channel Setting Pacing Amplitude: 2 V
Lead Channel Setting Pacing Pulse Width: 0.4 ms
Lead Channel Setting Sensing Sensitivity: 5.6 mV
Zone Setting Status: 755011
Zone Setting Status: 755011

## 2023-01-12 ENCOUNTER — Telehealth: Payer: Self-pay | Admitting: Cardiology

## 2023-01-12 DIAGNOSIS — I5032 Chronic diastolic (congestive) heart failure: Secondary | ICD-10-CM

## 2023-01-12 MED ORDER — TORSEMIDE 20 MG PO TABS
20.0000 mg | ORAL_TABLET | Freq: Every day | ORAL | 1 refills | Status: DC | PRN
Start: 2023-01-12 — End: 2023-07-14

## 2023-01-12 NOTE — Telephone Encounter (Signed)
Pt c/o swelling: STAT is pt has developed SOB within 24 hours  How much weight have you gained and in what time span?  About 3 lbs in 1 week   If swelling, where is the swelling located?  Feet and ankles   Are you currently taking a fluid pill?  Torsemide 5 MG - 2 tablets once daily  Are you currently SOB?  No   Do you have a log of your daily weights (if so, list)?  Sometime last week: 170 lbs 12/29: 173 lbs  Have you gained 3 pounds in a day or 5 pounds in a week?    Have you traveled recently?  No

## 2023-01-12 NOTE — Telephone Encounter (Signed)
Spoke with patient and she stated she was out of her fluid pill for a while. Now she has swelling in her in her feet and ankles. Her PCP did refill 5 mg of torsemide but she states that is not enough so she has been taking 2 5 mg tablets. She state it has came down some but she still has some swelling. She states she does not think 10 mg is  enough. She did have SOB last week, denies any SOB today. No chest pain, headache, nausea or vomiting,  Did advise to monitor her salt intake, elevate legs and feet as much as possible and try compression stockings.

## 2023-01-12 NOTE — Telephone Encounter (Signed)
ICD-10-CM   1. Chronic diastolic heart failure (HCC)  W09.81 torsemide (DEMADEX) 20 MG tablet     Medications Discontinued During This Encounter  Medication Reason   torsemide (DEMADEX) 10 MG tablet    Meds ordered this encounter  Medications   torsemide (DEMADEX) 20 MG tablet    Sig: Take 1 tablet (20 mg total) by mouth daily as needed (Take higher dose as needed for fluid). Alternate 10mg  with 20mg  daily    Dispense:  90 tablet    Refill:  1

## 2023-01-12 NOTE — Telephone Encounter (Signed)
I sent 20 mg Rx to [facility] in [place]

## 2023-01-12 NOTE — Telephone Encounter (Signed)
Spoke with patient and she is aware Torsemide 20 mg was sent to pharmacy

## 2023-01-16 ENCOUNTER — Other Ambulatory Visit: Payer: Self-pay | Admitting: Cardiology

## 2023-01-16 DIAGNOSIS — I2510 Atherosclerotic heart disease of native coronary artery without angina pectoris: Secondary | ICD-10-CM

## 2023-01-16 NOTE — Telephone Encounter (Signed)
 Dr. Jacinto Halim pt Please advise thank you

## 2023-01-16 NOTE — Telephone Encounter (Signed)
 Refill sent to pharmacy.

## 2023-01-26 DIAGNOSIS — E1165 Type 2 diabetes mellitus with hyperglycemia: Secondary | ICD-10-CM | POA: Diagnosis not present

## 2023-02-04 DIAGNOSIS — H04123 Dry eye syndrome of bilateral lacrimal glands: Secondary | ICD-10-CM | POA: Diagnosis not present

## 2023-02-04 DIAGNOSIS — H52223 Regular astigmatism, bilateral: Secondary | ICD-10-CM | POA: Diagnosis not present

## 2023-02-04 DIAGNOSIS — H35033 Hypertensive retinopathy, bilateral: Secondary | ICD-10-CM | POA: Diagnosis not present

## 2023-02-04 DIAGNOSIS — H524 Presbyopia: Secondary | ICD-10-CM | POA: Diagnosis not present

## 2023-02-04 DIAGNOSIS — H40013 Open angle with borderline findings, low risk, bilateral: Secondary | ICD-10-CM | POA: Diagnosis not present

## 2023-02-04 DIAGNOSIS — E119 Type 2 diabetes mellitus without complications: Secondary | ICD-10-CM | POA: Diagnosis not present

## 2023-02-06 NOTE — Addendum Note (Signed)
Addended by: Geralyn Flash D on: 02/06/2023 11:57 AM   Modules accepted: Orders

## 2023-02-06 NOTE — Progress Notes (Signed)
Remote pacemaker transmission.

## 2023-02-26 DIAGNOSIS — E1165 Type 2 diabetes mellitus with hyperglycemia: Secondary | ICD-10-CM | POA: Diagnosis not present

## 2023-02-27 ENCOUNTER — Encounter: Payer: Self-pay | Admitting: Gynecologic Oncology

## 2023-02-27 ENCOUNTER — Other Ambulatory Visit: Payer: Self-pay

## 2023-02-27 ENCOUNTER — Inpatient Hospital Stay: Payer: HMO | Attending: Gynecologic Oncology | Admitting: Gynecologic Oncology

## 2023-02-27 VITALS — BP 124/63 | HR 85 | Temp 98.5°F | Resp 18 | Wt 169.8 lb

## 2023-02-27 DIAGNOSIS — Z8544 Personal history of malignant neoplasm of other female genital organs: Secondary | ICD-10-CM | POA: Insufficient documentation

## 2023-02-27 DIAGNOSIS — Z08 Encounter for follow-up examination after completed treatment for malignant neoplasm: Secondary | ICD-10-CM | POA: Insufficient documentation

## 2023-02-27 DIAGNOSIS — C519 Malignant neoplasm of vulva, unspecified: Secondary | ICD-10-CM

## 2023-02-27 DIAGNOSIS — L900 Lichen sclerosus et atrophicus: Secondary | ICD-10-CM | POA: Diagnosis not present

## 2023-02-27 DIAGNOSIS — N9089 Other specified noninflammatory disorders of vulva and perineum: Secondary | ICD-10-CM

## 2023-02-27 DIAGNOSIS — Z9079 Acquired absence of other genital organ(s): Secondary | ICD-10-CM | POA: Diagnosis not present

## 2023-02-27 DIAGNOSIS — Z923 Personal history of irradiation: Secondary | ICD-10-CM | POA: Insufficient documentation

## 2023-02-27 MED ORDER — LIDOCAINE 5 % EX OINT: 1.0000 | TOPICAL_OINTMENT | Freq: Three times a day (TID) | CUTANEOUS | 0 refills | Status: DC | PRN

## 2023-02-27 NOTE — Patient Instructions (Signed)
It was good to see you today.  I do not see or feel any evidence of cancer recurrence on your exam.  I will see you for follow-up in 6 months.  As always, if you develop any new and concerning symptoms before your next visit, please call to see me sooner.

## 2023-02-27 NOTE — Progress Notes (Signed)
Gynecologic Oncology Return Clinic Visit  02/27/23  Reason for Visit: Follow-up in the setting of vulvar cancer    Treatment History: Oncology History  Vulvar cancer (HCC)  07/20/2020 Initial Diagnosis   Vulvar cancer (HCC)   07/26/2020 Surgery   EUA, vulvar biopsies  Findings: Pictures taken in media.  On exam, there is about a 4 x 3-1/2 cm area concerning for involvement by carcinoma that spans between the clitoris (replacing the bottom aspect of the clitoris) to just superior to the urethra.  This is a butterfly lesion that extends along the inner labia bilaterally.  Previous biopsy site is healing well with suture still intact.  There is a firmer and raised area that is approximately 1 cm around the area of the biopsy.  Biopsies taken circumferentially around the lesion noted as well as from the central aspect to help delineate what is cancer and what may be chronic dermatoses.  Given appearance today with patient more comfortable, I am suspicious that the entire lesion is carcinoma.   07/26/2020 Pathology Results   A. VULVA, 6 OCLOCK, INFERIOR ASPECT, BIOPSY:  - Superficial fragments of squamous cell carcinoma.   B. VULVA, 11 OCLOCK, BIOPSY:  - Invasive squamous cell carcinoma.  - No lymphovascular invasion.   C. VULVA, 2 OCLOCK, BIOPSY:  - Invasive squamous cell carcinoma.  - No lymphovascular invasion.   D. VULVA, CENTRAL, BIOPSY:  - Superficial fragment of squamous cell carcinoma.    08/06/2020 Imaging   PET: no evidence of metastatic disease   08/07/2020 Clinical Stage   Stage IB   08/22/2020 Surgery   Partial radical anterior vulvectomy with right inguinal SLN biopsy.  Findings: 4x4cm butterfly lesion on the anterior vulva, spanning from just superior to the clitoris to just superior to the urethral meatus. Lesion crossed the midline, more significant lesion on the right. LSG prior to surgery showed bilateral mapping. No hot or blue SLN identified on the left, mildly blue  and hot node identified as the SLN on the right. Decision made to abort left-sided LND given hypertension and difficulty ventilating. Additionally, given intra-op cardiac and respiratory status, decision made to proceed with a less radical resection (very close if not positive peri-urethral margin visibly.   08/22/2020 Pathology Results   Stage IB, lymph node assessment only on the right given comorbidities and cardiac issues during surgery  Tumor Focality:    Unifocal     Tumor Site:    Right vulva     Tumor Site:    Left vulva     Tumor Size:    Greatest Dimension (Centimeters): 4 cm     Histologic Type:    Squamous cell carcinoma, HPV-independent     Histologic Grade:    G3, poorly differentiated     Depth of Tumor Invasion:    6 mm     Tumor Border:    Infiltrating     Other Tissue / Organ Involvement:    Not applicable     Lymphovascular Invasion:    Not identified   MARGINS     Margin Status for Invasive Carcinoma:    All margins negative for invasive carcinoma       Closest Margin(s) to Invasive Carcinoma:    Peripheral: 12:00       Distance from Invasive Carcinoma to Closest Margin:    1 mm     Margin Status for HSIL (VIN2-3) or dVIN:    All margins negative for high-grade squamous intraepithelial lesion (HSIL) and /  or differentiated vulvar intraepithelial neoplasia (dVIN)   REGIONAL LYMPH NODES     Regional Lymph Node Status:           :    All regional lymph nodes negative for tumor cells       Total Number of Lymph Nodes Examined:    1         Nodal Site(s) Examined:    Right inguinal       Number of Sentinel Nodes Examined:    1    09/06/2020 Genetic Testing   Negative genetic testing:  No pathogenic variants detected on the [facility] Multi-Cancer + RNA panel. A variant of uncertain significance (VUS) was detected in the POLD1 gene called c.427G>A (p.Gly143Ser). The report date is 09/06/2020.  The Multi-Cancer + RNA Panel offered by [facility] includes sequencing and/or  deletion/duplication analysis of the following 84 genes:  AIP*, ALK, APC*, ATM*, AXIN2*, BAP1*, BARD1*, BLM*, BMPR1A*, BRCA1*, BRCA2*, BRIP1*, CASR, CDC73*, CDH1*, CDK4, CDKN1B*, CDKN1C*, CDKN2A, CEBPA, CHEK2*, CTNNA1*, DICER1*, DIS3L2*, EGFR, EPCAM, FH*, FLCN*, GATA2*, GPC3, GREM1, HOXB13, HRAS, KIT, MAX*, MEN1*, MET, MITF, MLH1*, MSH2*, MSH3*, MSH6*, MUTYH*, NBN*, NF1*, NF2*, NTHL1*, PALB2*, PDGFRA, PHOX2B, PMS2*, POLD1*, POLE*, POT1*, PRKAR1A*, PTCH1*, PTEN*, RAD50*, RAD51C*, RAD51D*, RB1*, RECQL4, RET, RUNX1*, SDHA*, SDHAF2*, SDHB*, SDHC*, SDHD*, SMAD4*, SMARCA4*, SMARCB1*, SMARCE1*, STK11*, SUFU*, TERC, TERT, TMEM127*, Tp53*, TSC1*, TSC2*, VHL*, WRN*, and WT1.  RNA analysis is performed for * genes.    10/23/2020 - 12/14/2020 Radiation Therapy   10/23/2020 through 12/14/2020 Site Technique Total Dose (Gy) Dose per Fx (Gy) Completed Fx Beam Energies  Vulva: Pelvis IMRT 50.4/50.4 1.8 28/28 6X  Vulva: Pelvis_Bst IMRT 9/9 1.8 5/5 6X         Interval History: Reports overall doing well.  Bowel function has been significantly improved until last night.  She had an appointment previously scheduled with GI but was hospitalized with CHF and then had a URI for multiple weeks requiring her to cancel her appointment.  She plans to call to get this rescheduled soon.  She denies any change to urinary symptoms.  Endorses decreased appetite, at baseline and unchanged recently.  In terms of her vulvar symptoms, she endorses some soreness although feels better today.  Has occasional pruritus.  Finds lidocaine very helpful.  Past Medical/Surgical History: Past Medical History:  Diagnosis Date   Arthritis    CAD (coronary artery disease)    Diabetes mellitus without complication (HCC)    Diastolic heart failure (HCC)    Dyspnea    Encounter for care of pacemaker 09/03/2018   Family history of kidney cancer    Family history of throat cancer    Family history of uterine cancer    History of chickenpox     History of radiation therapy    Pelvis 10/23/20-12/14/20- Dr. Antony Blackbird   Hx of psoriatic arthritis    Hyperlipemia    Hypertension    Hypertension 05/03/2018   Hypothyroidism    Mitral valve disorders(424.0)    Myocardial infarction (HCC)    Obstructive sleep apnea    OSA (obstructive sleep apnea)    using BiPAP   Pacemaker    Paroxysmal atrial fibrillation (HCC) 10/11/2016   Peripheral neuropathy    Renal disorder Kidney disease stage2   Sick sinus syndrome [facility])    S/p dual-chamber pacemaker   Thyroid disease hypothyroid   Urine incontinence    Vitamin D deficiency    Vulvar cancer (HCC) 2022   s/p surgery and radiation therapy  October-December 2022    Past Surgical History:  Procedure Laterality Date   BACK SURGERY     BACK SURGERY     BIOPSY  11/20/2021   Procedure: BIOPSY;  Surgeon: Corbin Ade, MD;  Location: [facility]O [facility];  Service: Endoscopy;;   CARDIAC CATHETERIZATION N/A 04/29/2015   Procedure: Temporary Pacemaker;  Surgeon: Yates Decamp, MD;  Location: [facility] INVASIVE CV LAB;  Service: Cardiovascular;  Laterality: N/A;   CARDIOVERSION N/A 09/07/2018   Procedure: CARDIOVERSION;  Surgeon: Yates Decamp, MD;  Location: [facility] ENDOSCOPY;  Service: Cardiovascular;  Laterality: N/A;   CARDIOVERSION     COLONOSCOPY  08/16/2008   Dr. Jena Gauss; pancolonic diverticulosis, otherwise no abnormalities.  Consider repeat in 10 years.   COLONOSCOPY WITH PROPOFOL N/A 11/20/2021   Procedure: COLONOSCOPY WITH PROPOFOL;  Surgeon: Corbin Ade, MD;  Location: [facility];  Service: Endoscopy;  Laterality: N/A;  8:15am, asa 3   CORONARY PRESSURE/FFR STUDY N/A 08/31/2017   Procedure: INTRAVASCULAR PRESSURE WIRE/FFR STUDY;  Surgeon: Elder Negus, MD;  Location: [facility] INVASIVE CV LAB;  Service: Cardiovascular;  Laterality: N/A;   EP IMPLANTABLE DEVICE N/A 04/30/2015   Procedure: Pacemaker Implant;  Surgeon: Marinus Maw, MD;  Location: [facility] INVASIVE CV LAB;  Service: Cardiovascular;   Laterality: N/A;   LEFT AND RIGHT HEART CATHETERIZATION WITH CORONARY ANGIOGRAM N/A 09/30/2011   Procedure: LEFT AND RIGHT HEART CATHETERIZATION WITH CORONARY ANGIOGRAM;  Surgeon: Pamella Pert, MD;  Location: [facility] CATH LAB;  Service: Cardiovascular;  Laterality: N/A;   POLYPECTOMY  11/20/2021   Procedure: POLYPECTOMY;  Surgeon: Corbin Ade, MD;  Location: [facility];  Service: Endoscopy;;   RIGHT/LEFT HEART CATH AND CORONARY ANGIOGRAPHY N/A 08/31/2017   Procedure: RIGHT/LEFT HEART CATH AND CORONARY ANGIOGRAPHY;  Surgeon: Elder Negus, MD;  Location: [facility] INVASIVE CV LAB;  Service: Cardiovascular;  Laterality: N/A;   TOOTH EXTRACTION  10/07/2018   TUBAL LIGATION     VULVA Ples Specter BIOPSY N/A 07/26/2020   Procedure: VULVAR BIOPSY;  Surgeon: Carver Fila, MD;  Location: [facility] ORS;  Service: Gynecology;  Laterality: N/A;   YAG LASER APPLICATION Right 03/07/2014   Procedure: YAG LASER APPLICATION;  Surgeon: Loraine Leriche T. Nile Riggs, MD;  Location: [facility] ORS;  Service: Ophthalmology;  Laterality: Right;  pt knows to arrive at 11:15   YAG LASER APPLICATION Left 03/21/2014   Procedure: YAG LASER APPLICATION;  Surgeon: Jethro Bolus, MD;  Location: [facility] ORS;  Service: Ophthalmology;  Laterality: Left;    Family History  Problem Relation Age of Onset   Arthritis Mother    Diabetes Mother    Heart disease Mother    Hyperlipidemia Mother    Hypertension Mother    Arthritis Father    Asthma Father    Heart attack Father    Hyperlipidemia Father    Hypertension Father    Stroke Father    Arthritis Sister    Diabetes Sister    Hypertension Sister    Arthritis Sister    Diabetes Sister    Hyperlipidemia Sister    Hypertension Sister    Stroke Sister    Ovarian cancer Sister 41   Pancreatic cancer Sister 81   Uterine cancer Sister 59   Hyperlipidemia Sister    Hypertension Sister    Arthritis Brother    Heart attack Brother    Heart disease Brother    Hyperlipidemia Brother     Hypertension Brother    Alcohol abuse Brother    Arthritis Brother  Diabetes Brother    Early death Brother    Heart disease Brother    Hyperlipidemia Brother    Hypertension Brother    Cancer Maternal Aunt 27       unknown type   Throat cancer Maternal Grandfather        hx smoking/drinking   Hypertension Daughter    Cancer Daughter        "female cancer cells"   Kidney cancer Nephew        dx 60s   Colon cancer Neg Hx    Breast cancer Neg Hx    Prostate cancer Neg Hx     Social History   Socioeconomic History   Marital status: Widowed    Spouse name: Not on file   Number of children: 3   Years of education: Not on file   Highest education level: Not on file  Occupational History   Occupation: Retired  Tobacco Use   Smoking status: Never   Smokeless tobacco: Never  Vaping Use   Vaping status: Never Used  Substance and Sexual Activity   Alcohol use: No    Alcohol/week: 0.0 standard drinks of alcohol   Drug use: No   Sexual activity: Not Currently    Birth control/protection: Post-menopausal  Other Topics Concern   Not on file  Social History Narrative   Husband recently passed away on 05/09/20.   Social Drivers of Corporate investment banker Strain: Low Risk  (10/06/2022)   Overall Financial Resource Strain (CARDIA)    Difficulty of Paying Living Expenses: Not hard at all  Food Insecurity: No Food Insecurity (10/04/2022)   Hunger Vital Sign    Worried About Running Out of Food in the Last Year: Never true    Ran Out of Food in the Last Year: Never true  Transportation Needs: No Transportation Needs (10/06/2022)   PRAPARE - Administrator, Civil Service (Medical): No    Lack of Transportation (Non-Medical): No  Physical Activity: Not on file  Stress: Not on file  Social Connections: Not on file    Current Medications:  Current Outpatient Medications:    amiodarone (PACERONE) 200 MG tablet, Take 1/2 (one-half) tablet by mouth once  daily (Patient taking differently: Take 100 mg by mouth daily.), Disp: 45 tablet, Rfl: 3   apixaban (ELIQUIS) 5 MG TABS tablet, Take 1 tablet (5 mg total) by mouth 2 (two) times daily., Disp: 180 tablet, Rfl: 3   Blood Glucose Monitoring Suppl (ACCU-CHEK AVIVA PLUS) w/Device KIT, , Disp: , Rfl:    cholecalciferol (VITAMIN D3) 25 MCG (1000 UNIT) tablet, Take 2,000 Units by mouth in the morning., Disp: , Rfl:    clobetasol cream (TEMOVATE) 0.05 %, Apply 1 Application topically 2 (two) times daily as needed (rash)., Disp: , Rfl:    Continuous Glucose Sensor (DEXCOM G7 SENSOR) MISC, , Disp: , Rfl:    dapagliflozin propanediol (FARXIGA) 10 MG TABS tablet, Take 1 tablet (10 mg total) by mouth daily before breakfast., Disp: 90 tablet, Rfl: 3   fenofibrate 160 MG tablet, Take 160 mg by mouth daily with supper. , Disp: , Rfl:    insulin aspart protamine- aspart (NOVOLOG MIX 70/30) (70-30) 100 UNIT/ML injection, Inject 0.1-0.2 mLs (10-20 Units total) into the skin See admin instructions. Inject 20 units subcutaneously with breakfast & inject 10 units subcutaneously with supper (may adjust based on blood sugar readings), Disp: , Rfl:    isosorbide mononitrate (IMDUR) 60 MG 24 hr  tablet, Take 1 tablet by mouth once daily, Disp: 90 tablet, Rfl: 1   levothyroxine (SYNTHROID) 137 MCG tablet, Take 137 mcg by mouth daily before breakfast., Disp: , Rfl:    loperamide (IMODIUM) 2 MG capsule, Take 4 mg by mouth 2 (two) times daily., Disp: , Rfl:    metoprolol tartrate (LOPRESSOR) 50 MG tablet, Take 1 tablet by mouth twice daily (Patient taking differently: Take 50 mg by mouth 2 (two) times daily.), Disp: 180 tablet, Rfl: 0   nitroGLYCERIN (NITROSTAT) 0.4 MG SL tablet, Place 1 tablet (0.4 mg total) under the tongue every 5 (five) minutes as needed for chest pain., Disp: 25 tablet, Rfl: 1   olmesartan (BENICAR) 40 MG tablet, Take 40 mg by mouth daily., Disp: , Rfl:    spironolactone (ALDACTONE) 25 MG tablet, Take 1 tablet  by mouth once daily, Disp: 90 tablet, Rfl: 1   torsemide (DEMADEX) 20 MG tablet, Take 1 tablet (20 mg total) by mouth daily as needed (Take higher dose as needed for fluid). Alternate 10mg  with 20mg  daily, Disp: 90 tablet, Rfl: 1   lidocaine (XYLOCAINE) 5 % ointment, Apply 1 Application topically 3 (three) times daily as needed., Disp: 35.44 g, Rfl: 0  Review of Systems: Denies appetite changes, fevers, chills, fatigue, unexplained weight changes. Denies hearing loss, neck lumps or masses, mouth sores, ringing in ears or voice changes. Denies cough or wheezing.  Denies shortness of breath. Denies chest pain or palpitations. Denies leg swelling. Denies abdominal distention, pain, blood in stools, constipation, nausea, vomiting, or early satiety. Denies pain with intercourse, dysuria, frequency, hematuria or incontinence. Denies joint pain, back pain or muscle pain/cramps. Denies itching, rash, or wounds. Denies dizziness, headaches, numbness or seizures. Denies swollen lymph nodes or glands, denies easy bruising or bleeding. Denies anxiety, depression, confusion, or decreased concentration.  Physical Exam: BP (!) 128/53 (BP Location: Left Arm, Patient Position: Sitting) Comment: Notfied RN  Pulse 85   Temp 98.5 F (36.9 C) (Oral)   Resp 18   Wt 169 lb 12.8 oz (77 kg)   SpO2 94%   BMI 31.06 kg/m  General: Alert, oriented, no acute distress. HEENT: Posterior oropharynx clear, sclera anicteric. Chest: Clear to auscultation bilaterally.  No wheezes or rhonchi. Cardiovascular: Regular rate and rhythm, no murmurs. Abdomen: Obese, soft, nontender.  Normoactive bowel sounds.  No masses or hepatosplenomegaly appreciated.  Extremities: Grossly normal range of motion.  Warm, well perfused.  No edema bilaterally. Skin: No rashes or lesions noted. Lymphatics: No cervical, supraclavicular, or inguinal adenopathy. GU: External female genitalia atrophic with radiation changes present especially  along the perineum and posterior fourchette.  No inguinal adenopathy appreciated.  There is an area of leukoplakia anteriorly as well as some leukoplakia along the perineum, both stable from last visit.  Prior biopsy site from June still visible.  No new areas of leukoplakia, no atypical vascularity.  Laboratory & Radiologic Studies: None new  Assessment & Plan: [name] is a 78 y.o. 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 Completed adjuvant radiation in 12/2020. Vulvar biopsy performed 06/2022, no recurrence.   Patient is overall doing well.  She continues to have several spots consistent with lichen sclerosus-stable.  Prescription for topical lidocaine sent to her pharmacy.  Bowel function has improved considerably although she was not able to see GI since her last follow-up with [place].  Encouraged her to reach back out to get this appointment  rescheduled.   She is now more than 2 years from completing adjuvant therapy. Per NCCN surveillance recommendations, we will transition to visits every 6 months. We reviewed signs and symptoms that would be concerning for disease recurrence, and I have stressed the importance of her calling if she develops any of these between visits.   20 minutes of total time was spent for this patient encounter, including preparation, face-to-face counseling with the patient and coordination of care, and documentation of the encounter.  Eugene Garnet, MD  Division of Gynecologic Oncology  Department of Obstetrics and Gynecology  [facility]

## 2023-03-08 ENCOUNTER — Other Ambulatory Visit: Payer: Self-pay | Admitting: Cardiology

## 2023-03-16 ENCOUNTER — Other Ambulatory Visit: Payer: Self-pay | Admitting: Cardiology

## 2023-03-17 ENCOUNTER — Ambulatory Visit: Payer: HMO | Attending: Cardiology | Admitting: Cardiology

## 2023-03-17 ENCOUNTER — Encounter: Payer: Self-pay | Admitting: Cardiology

## 2023-03-17 VITALS — BP 110/48 | HR 94 | Resp 16 | Ht 62.0 in | Wt 171.2 lb

## 2023-03-17 DIAGNOSIS — I495 Sick sinus syndrome: Secondary | ICD-10-CM

## 2023-03-17 DIAGNOSIS — I480 Paroxysmal atrial fibrillation: Secondary | ICD-10-CM

## 2023-03-17 DIAGNOSIS — I130 Hypertensive heart and chronic kidney disease with heart failure and stage 1 through stage 4 chronic kidney disease, or unspecified chronic kidney disease: Secondary | ICD-10-CM

## 2023-03-17 DIAGNOSIS — N183 Chronic kidney disease, stage 3 unspecified: Secondary | ICD-10-CM | POA: Diagnosis not present

## 2023-03-17 NOTE — Progress Notes (Signed)
 Cardiology Office Note:  .   Date:  03/17/2023  ID:  ALESI ZACHERY, DOB Aug 22, 1945, MRN [id number] PCP: Merri Brunette, MD  [facility] Providers Cardiologist:  Yates Decamp, MD   History of Present Illness: .   [name] is a 78 y.o.  Caucasian female patient with primary hypertension, diabetes mellitus with stage IIIa chronic kidney disease, chronic diastolic heart failure, hypercholesterolemia, nonobstructive CAD (Cath 2019), atrial fibrillation and sick sinus syndrome status post dual-chamber pacemaker 2017, OSA on BiPAP.  Her last cardioversion was 09/07/2018 for atrial flutter.   Dyspnea has remained stable.  She has not had any further hospitalization or ED visit due to dyspnea, leg edema or PND or orthopnea.  Has not had any chest pain.  States that her blood pressure is well-controlled.  Discussed the use of AI scribe software for clinical note transcription with the patient, who gave verbal consent to proceed.  History of Present Illness   The patient, with a history of diastolic heart failure, hypertension, and paroxysmal atrial fibrillation, presents with chronic diarrhea. She reports taking four Imodium daily, but the diarrhea persists, causing her to fear leaving the house. She also reports a recent illness characterized by a deep cough, which resolved before Thanksgiving. She did not seek medical attention for this illness. The patient also mentions her daughter, who lives with her and helps with rent, but has become withdrawn and less active since suffering a stroke.      Labs   Lab Results  Component Value Date   NA 136 10/21/2022   K 5.0 10/21/2022   CO2 28 10/21/2022   GLUCOSE 139 (H) 10/21/2022   BUN 30 (H) 10/21/2022   CREATININE 1.76 (H) 10/21/2022   CALCIUM 9.7 10/21/2022   GFR 51.63 (L) 11/25/2016   EGFR 35 (L) 08/15/2021   GFRNONAA 29 (L) 10/21/2022      Latest Ref Rng & Units 10/21/2022   12:09 PM 10/09/2022    3:11 AM 10/08/2022    2:19 PM  BMP   Glucose 70 - 99 mg/dL 782  956  213   BUN 8 - 23 mg/dL 30  48  47   Creatinine 0.44 - 1.00 mg/dL 0.86  5.78  4.69   Sodium 135 - 145 mmol/L 136  127  127   Potassium 3.5 - 5.1 mmol/L 5.0  4.2  4.7   Chloride 98 - 111 mmol/L 98  94  89   CO2 22 - 32 mmol/L 28  24  27    Calcium 8.9 - 10.3 mg/dL 9.7  8.8  9.6       Latest Ref Rng & Units 10/04/2022   12:29 PM 09/03/2020   12:24 PM 07/26/2020   11:15 AM  CBC  WBC 4.0 - 10.5 K/uL 5.4  8.1  7.9   Hemoglobin 12.0 - 15.0 g/dL 62.9  9.9  52.8   Hematocrit 36.0 - 46.0 % 37.5  31.0  41.7   Platelets 150 - 400 K/uL 168  223  148    Lab Results  Component Value Date   HGBA1C 5.4 10/04/2022    Lab Results  Component Value Date   TSH 1.308 10/04/2022    External Labs:  PCP labs from [facility]  09/02/2022:  Total cholesterol 167, triglycerides 550, HDL 9, LDL 71.  Review of Systems  Cardiovascular:  Positive for dyspnea on exertion (stable). Negative for chest pain and leg swelling.   Physical Exam:   VS:  BP Marland Kitchen)  110/48 (BP Location: Left Arm, Patient Position: Sitting, Cuff Size: Normal)   Pulse 94   Resp 16   Ht 5\' 2"  (1.575 m)   Wt 171 lb 3.2 oz (77.7 kg)   SpO2 95%   BMI 31.31 kg/m    Wt Readings from Last 3 Encounters:  03/17/23 171 lb 3.2 oz (77.7 kg)  02/27/23 169 lb 12.8 oz (77 kg)  11/27/22 177 lb (80.3 kg)     Physical Exam Neck:     Vascular: No carotid bruit or JVD.  Cardiovascular:     Rate and Rhythm: Normal rate and regular rhythm.     Pulses: Intact distal pulses.     Heart sounds: S1 normal and S2 normal. Murmur heard.     Early systolic murmur is present with a grade of 2/6 at the upper right sternal border.     No gallop.  Pulmonary:     Effort: Pulmonary effort is normal.     Breath sounds: Normal breath sounds.  Abdominal:     General: Abdomen is protuberant. Bowel sounds are normal.     Palpations: Abdomen is soft.  Musculoskeletal:     Right lower leg: No edema.     Left lower leg: No edema.     Studies Reviewed: Marland Kitchen    ECHOCARDIOGRAM COMPLETE 10/05/2022  1. Left ventricular ejection fraction, by estimation, is 65 to 70%. The left ventricle has normal function. The left ventricle has no regional wall motion abnormalities. There is moderate left ventricular hypertrophy. Left ventricular diastolic parameters are indeterminate. 2. Right ventricular systolic function is normal. The right ventricular size is normal. 3. Left atrial size was mildly dilated. 4. The mitral valve is normal in structure. Mild mitral valve regurgitation. No evidence of mitral stenosis. The mean mitral valve gradient is 3.0 mmHg.  Remote pacemaker check 01/05/2023:  AP 100%, VP 0%.  Longevity 7 years and 1 month.  No high atrial rate or ventricular rate episodes.  Normal device function.  EKG:         Medications and allergies    Allergies  Allergen Reactions   Hydrocodone Other (See Comments)    Lethargic    Invokana [Canagliflozin] Palpitations and Other (See Comments)    Made heart race   Oxycodone Other (See Comments)    Lethargic      Current Outpatient Medications:    amiodarone (PACERONE) 200 MG tablet, Take 1/2 (one-half) tablet by mouth once daily (Patient taking differently: Take 100 mg by mouth daily.), Disp: 45 tablet, Rfl: 3   apixaban (ELIQUIS) 5 MG TABS tablet, Take 1 tablet (5 mg total) by mouth 2 (two) times daily., Disp: 180 tablet, Rfl: 3   Blood Glucose Monitoring Suppl (ACCU-CHEK AVIVA PLUS) w/Device KIT, , Disp: , Rfl:    cholecalciferol (VITAMIN D3) 25 MCG (1000 UNIT) tablet, Take 2,000 Units by mouth in the morning., Disp: , Rfl:    clobetasol cream (TEMOVATE) 0.05 %, Apply 1 Application topically 2 (two) times daily as needed (rash)., Disp: , Rfl:    Continuous Glucose Sensor (DEXCOM G7 SENSOR) MISC, , Disp: , Rfl:    dapagliflozin propanediol (FARXIGA) 10 MG TABS tablet, Take 1 tablet (10 mg total) by mouth daily before breakfast., Disp: 90 tablet, Rfl: 3   fenofibrate  160 MG tablet, Take 160 mg by mouth daily with supper. , Disp: , Rfl:    insulin aspart protamine- aspart (NOVOLOG MIX 70/30) (70-30) 100 UNIT/ML injection, Inject 0.1-0.2 mLs (10-20 Units total) into  the skin See admin instructions. Inject 20 units subcutaneously with breakfast & inject 10 units subcutaneously with supper (may adjust based on blood sugar readings), Disp: , Rfl:    isosorbide mononitrate (IMDUR) 60 MG 24 hr tablet, Take 1 tablet by mouth once daily, Disp: 90 tablet, Rfl: 1   levothyroxine (SYNTHROID) 137 MCG tablet, Take 137 mcg by mouth daily before breakfast., Disp: , Rfl:    lidocaine (XYLOCAINE) 5 % ointment, Apply 1 Application topically 3 (three) times daily as needed., Disp: 35.44 g, Rfl: 0   loperamide (IMODIUM) 2 MG capsule, Take 4 mg by mouth 2 (two) times daily., Disp: , Rfl:    metoprolol tartrate (LOPRESSOR) 50 MG tablet, Take 1 tablet by mouth twice daily, Disp: 180 tablet, Rfl: 1   nitroGLYCERIN (NITROSTAT) 0.4 MG SL tablet, Place 1 tablet (0.4 mg total) under the tongue every 5 (five) minutes as needed for chest pain., Disp: 25 tablet, Rfl: 1   olmesartan (BENICAR) 40 MG tablet, Take 40 mg by mouth daily., Disp: , Rfl:    spironolactone (ALDACTONE) 25 MG tablet, Take 1 tablet by mouth once daily, Disp: 90 tablet, Rfl: 1   torsemide (DEMADEX) 20 MG tablet, Take 1 tablet (20 mg total) by mouth daily as needed (Take higher dose as needed for fluid). Alternate 10mg  with 20mg  daily, Disp: 90 tablet, Rfl: 1   ASSESSMENT AND PLAN: .      ICD-10-CM   1. Hypertensive heart and kidney disease with HF and with CKD stage III (HCC)  I13.0    N18.30     2. Paroxysmal atrial fibrillation (HCC)  I48.0     3. Sinus node dysfunction (HCC)  I49.5       Assessment and Plan    Chronic Diastolic Heart Failure Chronic diastolic heart failure is well-controlled following a recent hospitalization in September, with no recent exacerbations. It is crucial to seek prompt medical  attention if symptoms worsen to prevent hospitalization. Monitor for heart failure symptoms and encourage immediate reporting of any exacerbation. Continue Imdur 60 mg, metoprolol tartrate 50 mg BID, spironolactone 25 mg, and omisartan 40 mg after a trial hold.  Paroxysmal Atrial Fibrillation Paroxysmal atrial fibrillation is managed with a functioning pacemaker and no recent episodes. Continue amiodarone 200 mg (1/2 tablet daily) and ensure regular pacemaker function monitoring.  Hypertension Hypertension is well-controlled with current medications, with a blood pressure reading of 110/48 mmHg today. Continue Imdur 60 mg, metoprolol tartrate 50 mg BID, spironolactone 25 mg, and omisartan 40 mg after a trial hold.  Diarrhea Chronic diarrhea may be related to medication side effects, with four Imodium taken daily. Omisartan and metoprolol are suspected contributors. Hold omisartan for two weeks to assess improvement. If no change, resume omisartan and hold metoprolol for two weeks. If diarrhea resolves after holding metoprolol, resume metoprolol.  Mixed Hyperlipidemia Mixed hyperlipidemia is managed with recent labs showing total cholesterol 167, triglycerides 150, HDL 9, and LDL 71. Continue the current lipid management plan.  Follow-up Follow up with cardiology in six months, attend a gastroenterology appointment on March 17, and follow up with Dr. Awanda Mink in June or July.           Signed,  Yates Decamp, MD, [facility] 03/17/2023, 1:47 PM [facility] [address] [place], [place] [address] Phone: [phone]. Fax:  [phone]

## 2023-03-17 NOTE — Patient Instructions (Signed)
  Medication Instructions:  Your physician recommends that you continue on your current medications as directed. Please refer to the Current Medication list given to you today.  Please hold olmesartan for 2 weeks to see if diarrhea improves.  If there is no change in bowel movements, please resume taking the medication.  Similarly metoprolol can rarely cause diarrhea as well, he can hold this for 2 weeks and if no change in bowel movements to restart metoprolol tartrate 50 mg twice daily.  *If you need a refill on your cardiac medications before your next appointment, please call your pharmacy*  Follow-Up: At [facility], you and your health needs are our priority.  As part of our continuing mission to provide you with exceptional heart care, we have created designated Provider Care Teams.  These Care Teams include your primary Cardiologist (physician) and Advanced Practice Providers (APPs -  Physician Assistants and Nurse Practitioners) who all work together to provide you with the care you need, when you need it.  Your next appointment:   6 month(s)  The format for your next appointment:   In Person  Provider:   Yates Decamp, MD {  Other Instructions   1st Floor: - Lobby - Registration  - Pharmacy  - Lab - Cafe  2nd Floor: - PV Lab - Diagnostic Testing (echo, CT, nuclear med)  3rd Floor: - Vacant  4th Floor: - TCTS (cardiothoracic surgery) - AFib Clinic - Structural Heart Clinic - Vascular Surgery  - Vascular Ultrasound  5th Floor: - [facility]Care Cardiology (general and EP) - Clinical Pharmacy for coumadin, hypertension, lipid, weight-loss medications, and med management appointments    Valet parking services will be available as well.

## 2023-03-18 DIAGNOSIS — E039 Hypothyroidism, unspecified: Secondary | ICD-10-CM | POA: Diagnosis not present

## 2023-03-18 DIAGNOSIS — E559 Vitamin D deficiency, unspecified: Secondary | ICD-10-CM | POA: Diagnosis not present

## 2023-03-18 DIAGNOSIS — E1122 Type 2 diabetes mellitus with diabetic chronic kidney disease: Secondary | ICD-10-CM | POA: Diagnosis not present

## 2023-03-24 DIAGNOSIS — E039 Hypothyroidism, unspecified: Secondary | ICD-10-CM | POA: Diagnosis not present

## 2023-03-24 DIAGNOSIS — E785 Hyperlipidemia, unspecified: Secondary | ICD-10-CM | POA: Diagnosis not present

## 2023-03-24 DIAGNOSIS — E1122 Type 2 diabetes mellitus with diabetic chronic kidney disease: Secondary | ICD-10-CM | POA: Diagnosis not present

## 2023-03-24 DIAGNOSIS — I129 Hypertensive chronic kidney disease with stage 1 through stage 4 chronic kidney disease, or unspecified chronic kidney disease: Secondary | ICD-10-CM | POA: Diagnosis not present

## 2023-03-24 DIAGNOSIS — E559 Vitamin D deficiency, unspecified: Secondary | ICD-10-CM | POA: Diagnosis not present

## 2023-03-25 DIAGNOSIS — H16143 Punctate keratitis, bilateral: Secondary | ICD-10-CM | POA: Diagnosis not present

## 2023-03-25 DIAGNOSIS — E119 Type 2 diabetes mellitus without complications: Secondary | ICD-10-CM | POA: Diagnosis not present

## 2023-03-25 DIAGNOSIS — H04123 Dry eye syndrome of bilateral lacrimal glands: Secondary | ICD-10-CM | POA: Diagnosis not present

## 2023-03-26 DIAGNOSIS — E1165 Type 2 diabetes mellitus with hyperglycemia: Secondary | ICD-10-CM | POA: Diagnosis not present

## 2023-03-27 DIAGNOSIS — G4733 Obstructive sleep apnea (adult) (pediatric): Secondary | ICD-10-CM | POA: Diagnosis not present

## 2023-03-30 ENCOUNTER — Encounter: Payer: Self-pay | Admitting: Internal Medicine

## 2023-03-30 ENCOUNTER — Ambulatory Visit: Payer: HMO | Admitting: Internal Medicine

## 2023-03-30 ENCOUNTER — Other Ambulatory Visit: Payer: Self-pay

## 2023-03-30 VITALS — BP 135/67 | HR 84 | Temp 97.5°F | Ht 62.0 in | Wt 172.0 lb

## 2023-03-30 DIAGNOSIS — R195 Other fecal abnormalities: Secondary | ICD-10-CM | POA: Diagnosis not present

## 2023-03-30 DIAGNOSIS — R159 Full incontinence of feces: Secondary | ICD-10-CM | POA: Diagnosis not present

## 2023-03-30 MED ORDER — CHOLESTYRAMINE 4 G PO PACK: 4.0000 g | PACK | Freq: Every day | ORAL | 11 refills | Status: DC

## 2023-03-30 NOTE — Patient Instructions (Signed)
 It was good to see you again today.  You may continue Imodium as needed for loose bowels  Will add Questran or cholestyramine 4 g orally daily.  Dispense 30 doses with 11 refills  We will need to incorporate this into your routine so that you are taking it at a  separate time( 2 hours after or 2 hours before any of the other medications you are taking).  This will lower your triglycerides.  However, it has a well-known effect of constipating and slowing bowel function down which is what you need.  Will plan to see you back in the office in 3 months

## 2023-03-30 NOTE — Progress Notes (Signed)
 Primary Care Physician:  Merri Brunette, MD Primary Gastroenterologist:  Dr. Jena Gauss  Pre-Procedure History & Physical: HPI:  [name] is a 78 y.o. female here for follow-up of chronic postprandial diarrhea and incontinence.  Continues to have symptoms worsened since her last visit no bleeding.  Incontinence often.  Prior stool studies negative.  Colon biopsies negative for microscopic colitis.  Multiple comorbidities including diabetes.  Takes olmesartan.  Uses Imodium 4 times a day.  She is lost about 20 pounds since last year.  States not eating much.  No abdominal pain.  Past Medical History:  Diagnosis Date   Arthritis    CAD (coronary artery disease)    Diabetes mellitus without complication (HCC)    Diastolic heart failure (HCC)    Dyspnea    Encounter for care of pacemaker 09/03/2018   Family history of kidney cancer    Family history of throat cancer    Family history of uterine cancer    History of chickenpox    History of radiation therapy    Pelvis 10/23/20-12/14/20- Dr. Antony Blackbird   Hx of psoriatic arthritis    Hyperlipemia    Hypertension    Hypertension 05/03/2018   Hypothyroidism    Mitral valve disorders(424.0)    Myocardial infarction (HCC)    Obstructive sleep apnea    OSA (obstructive sleep apnea)    using BiPAP   Pacemaker    Paroxysmal atrial fibrillation (HCC) 10/11/2016   Peripheral neuropathy    Renal disorder Kidney disease stage2   Sick sinus syndrome [facility])    S/p dual-chamber pacemaker   Thyroid disease hypothyroid   Urine incontinence    Vitamin D deficiency    Vulvar cancer (HCC) 2022   s/p surgery and radiation therapy October-December 2022    Past Surgical History:  Procedure Laterality Date   BACK SURGERY     BACK SURGERY     BIOPSY  11/20/2021   Procedure: BIOPSY;  Surgeon: Corbin Ade, MD;  Location: [facility] ENDO SUITE;  Service: Endoscopy;;   CARDIAC CATHETERIZATION N/A 04/29/2015   Procedure: Temporary Pacemaker;   Surgeon: Yates Decamp, MD;  Location: [facility] INVASIVE CV LAB;  Service: Cardiovascular;  Laterality: N/A;   CARDIOVERSION N/A 09/07/2018   Procedure: CARDIOVERSION;  Surgeon: Yates Decamp, MD;  Location: [facility] ENDOSCOPY;  Service: Cardiovascular;  Laterality: N/A;   CARDIOVERSION     COLONOSCOPY  08/16/2008   Dr. Jena Gauss; pancolonic diverticulosis, otherwise no abnormalities.  Consider repeat in 10 years.   COLONOSCOPY WITH PROPOFOL N/A 11/20/2021   Procedure: COLONOSCOPY WITH PROPOFOL;  Surgeon: Corbin Ade, MD;  Location: [facility] ENDO SUITE;  Service: Endoscopy;  Laterality: N/A;  8:15am, asa 3   CORONARY PRESSURE/FFR STUDY N/A 08/31/2017   Procedure: INTRAVASCULAR PRESSURE WIRE/FFR STUDY;  Surgeon: Elder Negus, MD;  Location: [facility] INVASIVE CV LAB;  Service: Cardiovascular;  Laterality: N/A;   EP IMPLANTABLE DEVICE N/A 04/30/2015   Procedure: Pacemaker Implant;  Surgeon: Marinus Maw, MD;  Location: [facility] INVASIVE CV LAB;  Service: Cardiovascular;  Laterality: N/A;   LEFT AND RIGHT HEART CATHETERIZATION WITH CORONARY ANGIOGRAM N/A 09/30/2011   Procedure: LEFT AND RIGHT HEART CATHETERIZATION WITH CORONARY ANGIOGRAM;  Surgeon: Pamella Pert, MD;  Location: [facility] CATH LAB;  Service: Cardiovascular;  Laterality: N/A;   POLYPECTOMY  11/20/2021   Procedure: POLYPECTOMY;  Surgeon: Corbin Ade, MD;  Location: [facility] ENDO [facility];  Service: Endoscopy;;   RIGHT/LEFT HEART CATH AND CORONARY ANGIOGRAPHY N/A 08/31/2017  Procedure: RIGHT/LEFT HEART CATH AND CORONARY ANGIOGRAPHY;  Surgeon: Elder Negus, MD;  Location: [facility] INVASIVE CV LAB;  Service: Cardiovascular;  Laterality: N/A;   TOOTH EXTRACTION  10/07/2018   TUBAL LIGATION     VULVA Ples Specter BIOPSY N/A 07/26/2020   Procedure: VULVAR BIOPSY;  Surgeon: Carver Fila, MD;  Location: [facility] ORS;  Service: Gynecology;  Laterality: N/A;   YAG LASER APPLICATION Right 03/07/2014   Procedure: YAG LASER APPLICATION;  Surgeon: Loraine Leriche T. Nile Riggs, MD;  Location: [facility]  ORS;  Service: Ophthalmology;  Laterality: Right;  pt knows to arrive at 11:15   YAG LASER APPLICATION Left 03/21/2014   Procedure: YAG LASER APPLICATION;  Surgeon: Jethro Bolus, MD;  Location: [facility] ORS;  Service: Ophthalmology;  Laterality: Left;    Prior to Admission medications   Medication Sig Start Date End Date Taking? Authorizing Provider  amiodarone (PACERONE) 200 MG tablet Take 1/2 (one-half) tablet by mouth once daily Patient taking differently: Take 100 mg by mouth daily. 05/20/22  Yes Yates Decamp, MD  apixaban (ELIQUIS) 5 MG TABS tablet Take 1 tablet (5 mg total) by mouth 2 (two) times daily. 07/09/22  Yes Yates Decamp, MD  Blood Glucose Monitoring Suppl (ACCU-CHEK AVIVA PLUS) w/Device KIT    Yes [provider]  cholecalciferol (VITAMIN D3) 25 MCG (1000 UNIT) tablet Take 2,000 Units by mouth in the morning.   Yes [provider]  clobetasol cream (TEMOVATE) 0.05 % Apply 1 Application topically 2 (two) times daily as needed (rash).   Yes [provider]  Continuous Glucose Sensor (DEXCOM G7 SENSOR) MISC  09/17/22  Yes [provider]  dapagliflozin propanediol (FARXIGA) 10 MG TABS tablet Take 1 tablet (10 mg total) by mouth daily before breakfast. 03/18/23  Yes Yates Decamp, MD  fenofibrate 160 MG tablet Take 160 mg by mouth daily with supper.    Yes [provider]  insulin aspart protamine- aspart (NOVOLOG MIX 70/30) (70-30) 100 UNIT/ML injection Inject 0.1-0.2 mLs (10-20 Units total) into the skin See admin instructions. Inject 20 units subcutaneously with breakfast & inject 10 units subcutaneously with supper (may adjust based on blood sugar readings) 10/09/22  Yes Zannie Cove, MD  isosorbide mononitrate (IMDUR) 60 MG 24 hr tablet Take 1 tablet by mouth once daily 01/16/23  Yes Yates Decamp, MD  levothyroxine (SYNTHROID) 137 MCG tablet Take 137 mcg by mouth daily before breakfast.   Yes [provider]  lidocaine (XYLOCAINE) 5 % ointment Apply  1 Application topically 3 (three) times daily as needed. 02/27/23  Yes Carver Fila, MD  loperamide (IMODIUM) 2 MG capsule Take 4 mg by mouth 2 (two) times daily.   Yes [provider]  metoprolol tartrate (LOPRESSOR) 50 MG tablet Take 1 tablet by mouth twice daily 03/09/23  Yes Yates Decamp, MD  nitroGLYCERIN (NITROSTAT) 0.4 MG SL tablet Place 1 tablet (0.4 mg total) under the tongue every 5 (five) minutes as needed for chest pain. 10/01/15  Yes Orpah Cobb, MD  spironolactone (ALDACTONE) 25 MG tablet Take 1 tablet by mouth once daily 12/30/22  Yes Yates Decamp, MD  torsemide (DEMADEX) 20 MG tablet Take 1 tablet (20 mg total) by mouth daily as needed (Take higher dose as needed for fluid). Alternate 10mg  with 20mg  daily 01/12/23  Yes Yates Decamp, MD  olmesartan (BENICAR) 40 MG tablet Take 40 mg by mouth daily. Patient not taking: Reported on 03/30/2023 10/27/22   [provider]    Allergies as of 03/30/2023 - Review  Complete 03/30/2023  Allergen Reaction Noted   Hydrocodone Other (See Comments) May 01, 2015   Invokana [canagliflozin] Palpitations and Other (See Comments) 05-01-2015   Oxycodone Other (See Comments) 2015-05-01    Family History  Problem Relation Age of Onset   Arthritis Mother    Diabetes Mother    Heart disease Mother    Hyperlipidemia Mother    Hypertension Mother    Arthritis Father    Asthma Father    Heart attack Father    Hyperlipidemia Father    Hypertension Father    Stroke Father    Arthritis Sister    Diabetes Sister    Hypertension Sister    Arthritis Sister    Diabetes Sister    Hyperlipidemia Sister    Hypertension Sister    Stroke Sister    Ovarian cancer Sister 82   Pancreatic cancer Sister 46   Uterine cancer Sister 71   Hyperlipidemia Sister    Hypertension Sister    Arthritis Brother    Heart attack Brother    Heart disease Brother    Hyperlipidemia Brother    Hypertension Brother    Alcohol abuse Brother    Arthritis  Brother    Diabetes Brother    Early death Brother    Heart disease Brother    Hyperlipidemia Brother    Hypertension Brother    Cancer Maternal Aunt 52       unknown type   Throat cancer Maternal Grandfather        hx smoking/drinking   Hypertension Daughter    Cancer Daughter        "female cancer cells"   Kidney cancer Nephew        dx 49s   Colon cancer Neg Hx    Breast cancer Neg Hx    Prostate cancer Neg Hx     Social History   Socioeconomic History   Marital status: Widowed    Spouse name: Not on file   Number of children: 3   Years of education: Not on file   Highest education level: Not on file  Occupational History   Occupation: Retired  Tobacco Use   Smoking status: Never   Smokeless tobacco: Never  Vaping Use   Vaping status: Never Used  Substance and Sexual Activity   Alcohol use: No    Alcohol/week: 0.0 standard drinks of alcohol   Drug use: No   Sexual activity: Not Currently    Birth control/protection: Post-menopausal  Other Topics Concern   Not on file  Social History Narrative   Husband recently passed away on 04-30-20.   Social Drivers of Corporate investment banker Strain: Low Risk  (10/06/2022)   Overall Financial Resource Strain (CARDIA)    Difficulty of Paying Living Expenses: Not hard at all  Food Insecurity: No Food Insecurity (10/04/2022)   Hunger Vital Sign    Worried About Running Out of Food in the Last Year: Never true    Ran Out of Food in the Last Year: Never true  Transportation Needs: No Transportation Needs (10/06/2022)   PRAPARE - Administrator, Civil Service (Medical): No    Lack of Transportation (Non-Medical): No  Physical Activity: Not on file  Stress: Not on file  Social Connections: Not on file  Intimate Partner Violence: Not At Risk (10/04/2022)   Humiliation, Afraid, Rape, and Kick questionnaire    Fear of Current or Ex-Partner: No    Emotionally Abused: No  Physically Abused: No     Sexually Abused: No    Review of Systems: See HPI, otherwise negative ROS  Physical Exam: BP 135/67 (BP Location: Right Arm, Patient Position: Sitting, Cuff Size: Normal)   Pulse 84   Temp (!) 97.5 F (36.4 C) (Oral)   Ht 5\' 2"  (1.575 m)   Wt 172 lb (78 kg)   SpO2 96%   BMI 31.46 kg/m  General:   Alert,  Well-developed, well-nourished, pleasant and cooperative in NAD and well-groomed.  Intelligent conversant Lungs:  Clear throughout to auscultation.   No wheezes, crackles, or rhonchi. No acute distress. Heart:  Regular rate and rhythm; no murmurs, clicks, rubs,  or gallops. Abdomen: Non-distended, normal bowel sounds.  Soft and nontender without appreciable mass or hepatosplenomegaly.  Pulses:  Normal pulses noted. Extremities:  Without clubbing or edema.  Impression/Plan: 78 year old lady with multiple comorbidities continues to have issues with loose bowels,  occasional incontinence.  Seems like no out and out diarrhea but stools are loose and difficulty with controlling.  Imodium is not taking care of her symptoms.  We talked about off label use of resin binders previously and again at the bedside.  I have recommended we try a course of cholestyramine 4 g daily.  Will work out her schedule so she can take this medication either 2 hours before or after other medications.  Recommendations:  Will add Questran or cholestyramine 4 g orally daily.  Dispense 30 doses with 11 refills  Will incorporate Questran into her regimen so she does not take it within 2 hours before or after her other medications.  May continue Imodium.  Will plan to see  back in the office in 3 months   Notice: This dictation was prepared with Dragon dictation along with smaller phrase technology. Any transcriptional errors that result from this process are unintentional and may not be corrected upon review.

## 2023-03-31 ENCOUNTER — Ambulatory Visit (INDEPENDENT_AMBULATORY_CARE_PROVIDER_SITE_OTHER): Payer: Medicare HMO

## 2023-03-31 DIAGNOSIS — I495 Sick sinus syndrome: Secondary | ICD-10-CM | POA: Diagnosis not present

## 2023-04-02 LAB — CUP PACEART REMOTE DEVICE CHECK
Battery Impedance: 717 Ohm
Battery Remaining Longevity: 81 mo
Battery Voltage: 2.78 V
Brady Statistic AP VP Percent: 0 %
Brady Statistic AP VS Percent: 100 %
Brady Statistic AS VP Percent: 0 %
Brady Statistic AS VS Percent: 0 %
Date Time Interrogation Session: 20250317140825
Implantable Lead Connection Status: 753985
Implantable Lead Connection Status: 753985
Implantable Lead Implant Date: 20170417
Implantable Lead Implant Date: 20170417
Implantable Lead Location: 753859
Implantable Lead Location: 753860
Implantable Lead Model: 5076
Implantable Lead Model: 5076
Implantable Pulse Generator Implant Date: 20170417
Lead Channel Impedance Value: 392 Ohm
Lead Channel Impedance Value: 478 Ohm
Lead Channel Pacing Threshold Amplitude: 0.5 V
Lead Channel Pacing Threshold Amplitude: 0.75 V
Lead Channel Pacing Threshold Pulse Width: 0.4 ms
Lead Channel Pacing Threshold Pulse Width: 0.4 ms
Lead Channel Setting Pacing Amplitude: 1.5 V
Lead Channel Setting Pacing Amplitude: 2 V
Lead Channel Setting Pacing Pulse Width: 0.4 ms
Lead Channel Setting Sensing Sensitivity: 5.6 mV
Zone Setting Status: 755011
Zone Setting Status: 755011

## 2023-04-13 ENCOUNTER — Other Ambulatory Visit: Payer: Self-pay | Admitting: Cardiology

## 2023-04-13 NOTE — Telephone Encounter (Signed)
 Prescription refill request for Eliquis received. Indication: PAF Last office visit: 03/17/23  Jeanella Cara MD Scr: 1.61 on 03/18/23  Epic Age: 78 Weight: 77.7kg   Based on above findings Eliquis 2.5mg  twice daily is the appropriate dose.  Pt is currently taking 5mg  twice daily.  Message sent to PharmD Pool to advise on dose reduction.

## 2023-04-14 NOTE — Telephone Encounter (Signed)
 Called and informed pt of recommended Eliquis dose reduction due to age and kidney function..  She verbalized understanding and new Rx was sent to pharmacy.

## 2023-04-26 DIAGNOSIS — E1165 Type 2 diabetes mellitus with hyperglycemia: Secondary | ICD-10-CM | POA: Diagnosis not present

## 2023-05-12 ENCOUNTER — Other Ambulatory Visit: Payer: Self-pay | Admitting: Cardiology

## 2023-05-14 ENCOUNTER — Other Ambulatory Visit: Payer: Self-pay | Admitting: Cardiology

## 2023-05-18 NOTE — Addendum Note (Signed)
 Addended by: Lott Rouleau A on: 05/18/2023 09:39 AM   Modules accepted: Orders

## 2023-05-18 NOTE — Progress Notes (Signed)
 Remote pacemaker transmission.

## 2023-05-20 ENCOUNTER — Encounter: Payer: Self-pay | Admitting: Cardiovascular Disease

## 2023-05-20 ENCOUNTER — Ambulatory Visit: Attending: Cardiovascular Disease | Admitting: Cardiovascular Disease

## 2023-05-20 DIAGNOSIS — G4739 Other sleep apnea: Secondary | ICD-10-CM

## 2023-05-20 DIAGNOSIS — I5032 Chronic diastolic (congestive) heart failure: Secondary | ICD-10-CM

## 2023-05-20 DIAGNOSIS — I10 Essential (primary) hypertension: Secondary | ICD-10-CM

## 2023-05-20 DIAGNOSIS — Z950 Presence of cardiac pacemaker: Secondary | ICD-10-CM

## 2023-05-20 DIAGNOSIS — G4733 Obstructive sleep apnea (adult) (pediatric): Secondary | ICD-10-CM

## 2023-05-20 DIAGNOSIS — I495 Sick sinus syndrome: Secondary | ICD-10-CM

## 2023-05-20 DIAGNOSIS — Z7901 Long term (current) use of anticoagulants: Secondary | ICD-10-CM

## 2023-05-20 DIAGNOSIS — I480 Paroxysmal atrial fibrillation: Secondary | ICD-10-CM

## 2023-05-20 NOTE — Progress Notes (Signed)
 Patient ID: [name], female   DOB: 04/08/1945, 78 y.o.   MRN: [id number]    Primary MD: [name]  Primary cardiologist: [name]  HPI: [name] is a 78 y.o. female who was referred by [name] for a sleep evaluation.  I initially saw her in July 2019 with last follow-up in January 2022.  She presents for a 40 -month follow-up evaluation.    [name] was diagnosed with sleep apnea in February 2013 when she was referred for sleep study for evaluation of nonrestorative sleep and loud snoring.  At that time she had witnessed apnea, was gasping for breath and her symptoms had persisted for over 2 years.  She was found to have moderate obstructive sleep apnea.  Overall, with an HIV 18.5 per hour.  However, with rim sleep.  Events were severe, with an HI 55.7 per hour.  She had significant oxygen  desaturation to 70% with non-REM sleep and 64% with rim sleep.  She had moderate snoring.  She underwent a CPAP titration and with CPAP therapy due to development of central events, and the need for higher pressures.  She ultimately required BiPAP therapy.  Previously she had been followed by [facility] and had switched to [facility] in January 2015 for her MDE company.  She has not been able to get any supplies.  She has noticed a significant "whistle "her mask  I saw her for sleep evaluation in April 2016 and at that time was able to obtain a download of her unit from 03/28/2014 through 04/26/2014.  She was meeting Medicare compliance with use of 100%.  She is averaging 7 hours and 22 minutes per day and is currently set at an IPAP pressure of 19 and an EPAP pressure of 15.  Her AHI is very good at 4.0.  She set at a ramp time of 30 minutes.  She has a nasal mask, F&P Zest nasal mask.  She typically goes to bed at a 11:30 p.m. and wakes up at 6 AM for a sleep duration of only 6-1/2 hours.  She continues to have daytime sleepiness and an Epworth Sleepiness Scale score was elevated and endorsed at  15.  When I saw her in July 2019 after not having seen her since 2016  her BiPAP machine quit and was non functional for over 2 years.  As result, she has not been sleeping well.  She went to bed at midnight and woke up between 7 and 7:30 in the morning.  She usually has 2-3 episodes of nocturia.  She does snore.  She had recently seen [name] who recommended reassessment of her sleep apnea she presents for evaluation.  She underwent a reevaluation on August 11, 2017.  AHI was 26.6, AHI during REM sleep was significantly elevated at 62.4/h and there was significant oxygen  desaturation to a nadir of 73%.  Ultimately required BiPAP titration and underwent a BiPAP titration study on December 18, 2017 and 14/10 centimeters water pressure was recommended.  Since initiating BiPAP therapy, she has noticed significant improvement and feels much better.  She is sleeping well.  She has more energy.  Her set up date was October 19, 2017.  [employer] is her DME company.  A download was obtained from October 25, 2017 through November 23, 2017 which showed 100% compliance; sleeping 8 hours and 43 minutes.  AHI was excellent at 0.5 on her current settings.  She underwent a follow-up echo Doppler study at [facility] cardiovascular imaging  in October 2020 which showed an EF of 56%.  She has moderate concentric LVH.  There is severe left atrial enlargement, moderate mitral regurgitation, moderate tricuspid regurgitation and estimated pulmonary pressure was elevated at 40 mm.  I saw her in December 2020 at which time she was continuing to use BiPAP.  She has a dream station auto BiPAP unit with device settings at an EPAP of 10 and IPAP of 14.  Her ramp time has been set at 20 with a ramp start pressure of 4.  A new download was obtained in the from November 21, 2018 through December 20, 2018.  Compliance was excellent with 100% use; average use was 6 hours and 3 minutes per night.  A new Epworth Sleepiness Scale score was  calculated in the office and this endorsed at 16 consistent with residual daytime sleepiness.    I last saw her on February 06, 2020 at which time she was continuing to use her BiPAP consistently.    A download was obtained in the office from January 07, 2020 through February 05, 2020.  She is 100% compliant with average use of 5 hours and 38 minutes.  Her BiPAP settings are 14/10 centimeters of water and AHI is excellent at 1.1.  She believes she is sleeping well.  However her sleep duration is suboptimal because she feels that she often is listening all the time for her husband who has issues.  She typically goes to bed between 1130 and 1 AM and wakes up about 5 AM.  She is unaware of breakthrough snoring.  However, I calculated an Epworth Sleepiness Scale score today and this endorsed at 14 consistent with residual daytime sleepiness.  She had received information concerning the dream station Respironics recall but apparently she has never followed up with the company and provided them information.  She continues to follow with [name] for cardiology care and pacemaker follow-up.    Since I last saw her, she has continued to see [name] for primary care.  Unfortunately, her husband died in 06-01-20.  She has been diagnosed with vulvar cancer and had undergone radiation treatments.  In addition her daughter had a stroke and has been living with her. She tells me her machine BiPAP stopped working approximately 2 to 3 months ago.  [employer] is her DME company.  She is in need for a new machine and presents to the office today for evaluation.  Past Medical History:  Diagnosis Date   Arthritis    CAD (coronary artery disease)    Diabetes mellitus without complication (HCC)    Diastolic heart failure (HCC)    Dyspnea    Encounter for care of pacemaker 09/03/2018   Family history of kidney cancer    Family history of throat cancer    Family history of uterine cancer    History of chickenpox     History of radiation therapy    Pelvis 10/23/20-12/14/20- Dr. Retta Caster   Hx of psoriatic arthritis    Hyperlipemia    Hypertension    Hypertension 05/03/2018   Hypothyroidism    Mitral valve disorders(424.0)    Myocardial infarction (HCC)    Obstructive sleep apnea    OSA (obstructive sleep apnea)    using BiPAP   Pacemaker    Paroxysmal atrial fibrillation (HCC) 10/11/2016   Peripheral neuropathy    Renal disorder Kidney disease stage2   Sick sinus syndrome [facility])    S/p dual-chamber pacemaker   Thyroid   disease hypothyroid   Urine incontinence    Vitamin D  deficiency    Vulvar cancer (HCC) 2022   s/p surgery and radiation therapy October-December 2022    Past Surgical History:  Procedure Laterality Date   BACK SURGERY     BACK SURGERY     BIOPSY  11/20/2021   Procedure: BIOPSY;  Surgeon: Suzette Espy, MD;  Location: [facility] ENDO SUITE;  Service: Endoscopy;;   CARDIAC CATHETERIZATION N/A 04/29/2015   Procedure: Temporary Pacemaker;  Surgeon: Knox Perl, MD;  Location: [facility] INVASIVE CV LAB;  Service: Cardiovascular;  Laterality: N/A;   CARDIOVERSION N/A 09/07/2018   Procedure: CARDIOVERSION;  Surgeon: Knox Perl, MD;  Location[facility] ENDOSCOPY;  Service: Cardiovascular;  Laterality: N/A;   CARDIOVERSION     COLONOSCOPY  08/16/2008   Dr. Riley Cheadle; pancolonic diverticulosis, otherwise no abnormalities.  Consider repeat in 10 years.   COLONOSCOPY WITH PROPOFOL  N/A 11/20/2021   Procedure: COLONOSCOPY WITH PROPOFOL ;  Surgeon: Suzette Espy, MD;  Location: [facility] ENDO [facility]ITE;  Service: Endoscopy;  Laterality: N/A;  8:15am, asa 3   CORONARY PRESSURE/FFR STUDY N/A 08/31/2017   Procedure: INTRAVASCULAR PRESSURE WIRE/FFR STUDY;  Surgeon: Cody Das, MD;  Location: [facility] INVASIVE CV LAB;  Service: Cardiovascular;  Laterality: N/A;   EP IMPLANTABLE DEVICE N/A 04/30/2015   Procedure: Pacemaker Implant;  Surgeon: Tammie Fall, MD;  Location: [facility] INVASIVE CV LAB;  Service: Cardiovascular;   Laterality: N/A;   LEFT AND RIGHT HEART CATHETERIZATION WITH CORONARY ANGIOGRAM N/A 09/30/2011   Procedure: LEFT AND RIGHT HEART CATHETERIZATION WITH CORONARY ANGIOGRAM;  Surgeon: Jessica Morn, MD;  Location: [facility] CATH LAB;  Service: Cardiovascular;  Laterality: N/A;   POLYPECTOMY  11/20/2021   Procedure: POLYPECTOMY;  Surgeon: Suzette Espy, MD;  Location: [facility];  Service: Endoscopy;;   RIGHT/LEFT HEART CATH AND CORONARY ANGIOGRAPHY N/A 08/31/2017   Procedure: RIGHT/LEFT HEART CATH AND CORONARY ANGIOGRAPHY;  Surgeon: Cody Das, MD;  Location: [facility] INVASIVE CV LAB;  Service: Cardiovascular;  Laterality: N/A;   TOOTH EXTRACTION  10/07/2018   TUBAL LIGATION     VULVA Asher Blade BIOPSY N/A 07/26/2020   Procedure: VULVAR BIOPSY;  Surgeon: Suzi Essex, MD;  Location: [facility] ORS;  Service: Gynecology;  Laterality: N/A;   YAG LASER APPLICATION Right 03/07/2014   Procedure: YAG LASER APPLICATION;  Surgeon: Lavonia Powers T. Gennie Kicks, MD;  Location: [facility] ORS;  Service: Ophthalmology;  Laterality: Right;  pt knows to arrive at 11:15   YAG LASER APPLICATION Left 03/21/2014   Procedure: YAG LASER APPLICATION;  Surgeon: Albert Huff, MD;  Location: [facility] ORS;  Service: Ophthalmology;  Laterality: Left;    Allergies  Allergen Reactions   Hydrocodone  Other (See Comments)    Lethargic    Invokana [Canagliflozin] Palpitations and Other (See Comments)    Made heart race   Oxycodone Other (See Comments)    Lethargic     Current Outpatient Medications  Medication Sig Dispense Refill   amiodarone  (PACERONE ) 200 MG tablet Take 1/2 (one-half) tablet by mouth once daily 45 tablet 0   apixaban  (ELIQUIS ) 2.5 MG TABS tablet Take 1 tablet (2.5 mg total) by mouth 2 (two) times daily. 60 tablet 5   Blood Glucose Monitoring Suppl (ACCU-CHEK AVIVA PLUS) w/Device KIT      cholecalciferol  (VITAMIN D3) 25 MCG (1000 UNIT) tablet Take 2,000 Units by mouth in the morning.     cholestyramine  (QUESTRAN ) 4 g  packet Take 1 packet (4 g total) by mouth daily. 30 each  11   clobetasol  cream (TEMOVATE ) 0.05 % Apply 1 Application topically 2 (two) times daily as needed (rash).     Continuous Glucose Sensor (DEXCOM G7 SENSOR) MISC      dapagliflozin  propanediol (FARXIGA ) 10 MG TABS tablet Take 1 tablet (10 mg total) by mouth daily before breakfast. 90 tablet 3   fenofibrate  160 MG tablet Take 160 mg by mouth daily with supper.      insulin  aspart protamine- aspart (NOVOLOG  MIX 70/30) (70-30) 100 UNIT/ML injection Inject 0.1-0.2 mLs (10-20 Units total) into the skin See admin instructions. Inject 20 units subcutaneously with breakfast & inject 10 units subcutaneously with supper (may adjust based on blood sugar readings)     isosorbide  mononitrate (IMDUR ) 60 MG 24 hr tablet Take 1 tablet by mouth once daily 90 tablet 1   levothyroxine  (SYNTHROID ) 137 MCG tablet Take 137 mcg by mouth daily before breakfast.     lidocaine  (XYLOCAINE ) 5 % ointment Apply 1 Application topically 3 (three) times daily as needed. 35.44 g 0   loperamide  (IMODIUM ) 2 MG capsule Take 4 mg by mouth 2 (two) times daily.     metoprolol  tartrate (LOPRESSOR ) 50 MG tablet Take 1 tablet by mouth twice daily 180 tablet 1   nitroGLYCERIN  (NITROSTAT ) 0.4 MG SL tablet Place 1 tablet (0.4 mg total) under the tongue every 5 (five) minutes as needed for chest pain. 25 tablet 1   olmesartan  (BENICAR ) 40 MG tablet Take 40 mg by mouth daily.     spironolactone  (ALDACTONE ) 25 MG tablet Take 1 tablet by mouth once daily 90 tablet 1   torsemide  (DEMADEX ) 20 MG tablet Take 1 tablet (20 mg total) by mouth daily as needed (Take higher dose as needed for fluid). Alternate 10mg  with 20mg  daily 90 tablet 1   No current facility-administered medications for this visit.    Social History   Socioeconomic History   Marital status: Widowed    Spouse name: Not on file   Number of children: 3   Years of education: Not on file   Highest education level: Not on  file  Occupational History   Occupation: Retired  Tobacco Use   Smoking status: Never   Smokeless tobacco: Never  Vaping Use   Vaping status: Never Used  Substance and Sexual Activity   Alcohol use: No    Alcohol/week: 0.0 standard drinks of alcohol   Drug use: No   Sexual activity: Not Currently    Birth control/protection: Post-menopausal  Other Topics Concern   Not on file  Social History Narrative   Husband recently passed away on May 23, 2020.   Social Drivers of Corporate investment banker Strain: Low Risk  (10/06/2022)   Overall Financial Resource Strain (CARDIA)    Difficulty of Paying Living Expenses: Not hard at all  Food Insecurity: No Food Insecurity (10/04/2022)   Hunger Vital Sign    Worried About Running Out of Food in the Last Year: Never true    Ran Out of Food in the Last Year: Never true  Transportation Needs: No Transportation Needs (10/06/2022)   PRAPARE - Administrator, Civil Service (Medical): No    Lack of Transportation (Non-Medical): No  Physical Activity: Not on file  Stress: Not on file  Social Connections: Not on file  Intimate Partner Violence: Not At Risk (10/04/2022)   Humiliation, Afraid, Rape, and Kick questionnaire    Fear of Current or Ex-Partner: No    Emotionally Abused: No  Physically Abused: No    Sexually Abused: No   Social history is notable: She is married, has 3 children 5 grandchildren.  Family History  Problem Relation Age of Onset   Arthritis Mother    Diabetes Mother    Heart disease Mother    Hyperlipidemia Mother    Hypertension Mother    Arthritis Father    Asthma Father    Heart attack Father    Hyperlipidemia Father    Hypertension Father    Stroke Father    Arthritis Sister    Diabetes Sister    Hypertension Sister    Arthritis Sister    Diabetes Sister    Hyperlipidemia Sister    Hypertension Sister    Stroke Sister    Ovarian cancer Sister 75   Pancreatic cancer Sister 93    Uterine cancer Sister 80   Hyperlipidemia Sister    Hypertension Sister    Arthritis Brother    Heart attack Brother    Heart disease Brother    Hyperlipidemia Brother    Hypertension Brother    Alcohol abuse Brother    Arthritis Brother    Diabetes Brother    Early death Brother    Heart disease Brother    Hyperlipidemia Brother    Hypertension Brother    Cancer Maternal Aunt 65       unknown type   Throat cancer Maternal Grandfather        hx smoking/drinking   Hypertension Daughter    Cancer Daughter        "female cancer cells"   Kidney cancer Nephew        dx 66s   Colon cancer Neg Hx    Breast cancer Neg Hx    Prostate cancer Neg Hx    Family history is notable that her mother died at age 57 and had diabetes and heart disease.  Father died of stroke at age 97.  There are total of 6 siblings, 3 brothers and 3 sisters of which 2 brothers and 2 sisters are deceased.  One sister who is deceased, had sleep apnea but was untreated.  One brother at heart disease is deceased and had sleep apnea and was noncompliant with his CPAP use.  A sister is now living with her following her stroke.  ROS General: Negative; No fevers, chills, or night sweats.  Positive for moderate obesity HEENT: Negative; No changes in vision or hearing, sinus congestion, difficulty swallowing Pulmonary: Negative; No cough, wheezing, shortness of breath, hemoptysis Cardiovascular: Positive for diastolic heart failure GI: Negative; No nausea, vomiting, diarrhea, or abdominal pain GU: Negative; No dysuria, hematuria, or difficulty voiding Musculoskeletal: Negative; no myalgias, joint pain, or weakness Hematologic/oncologic: History of vulvar cancer with subsequent radiation treatment Endocrine: Positive for diabetes mellitus Neuro: Negative; no changes in balance, headaches Skin: Negative; No rashes or skin lesions Psychiatric: Negative; No behavioral problems, depression Sleep: Positive for OSA previously  with  central events with CPAP therapy; now on BiPAP.   Physical Exam BP (!) 144/73 (BP Location: Left Arm, Patient Position: Sitting, Cuff Size: Normal)   Pulse 77   Ht 5\' 2"  (1.575 m)   Wt 169 lb (76.7 kg)   SpO2 96%   BMI 30.91 kg/m    Repeat blood pressure was 134/74  Wt Readings from Last 3 Encounters:  05/20/23 169 lb (76.7 kg)  03/30/23 172 lb (78 kg)  03/17/23 171 lb 3.2 oz (77.7 kg)   General: Alert, oriented, no distress.  Skin: normal turgor, no rashes, warm and dry HEENT: Normocephalic, atraumatic. Pupils equal round and reactive to light; sclera anicteric; extraocular muscles intact;  Nose without nasal septal hypertrophy Mouth/Parynx benign; Mallinpatti scale 3 Neck: No JVD, no carotid bruits; normal carotid upstroke Lungs: clear to ausculatation and percussion; no wheezing or rales Chest wall: without tenderness to palpitation Heart: PMI not displaced, RRR, s1 s2 normal, 1/6 systolic murmur left sternal border and apex, no diastolic murmur, no rubs, gallops, thrills, or heaves Abdomen: soft, nontender; no hepatosplenomehaly, BS+; abdominal aorta nontender and not dilated by palpation. Back: no CVA tenderness Pulses 2+ Musculoskeletal: full range of motion, normal strength, no joint deformities Extremities: no clubbing cyanosis or edema, Homan's sign negative  Neurologic: grossly nonfocal; Cranial nerves grossly wnl Psychologic: Normal mood and affect  EKG Interpretation Date/Time:  Wednesday May 20 2023 09:17:54 EDT Ventricular Rate:  77 PR Interval:  290 QRS Duration:  114 QT Interval:  374 QTC Calculation: 423 R Axis:   -52  Text Interpretation: Atrial-paced rhythm with prolonged AV conduction Left axis deviation Minimal voltage criteria for LVH, may be normal variant ( Cornell product ) Septal infarct (cited on or before 31-Aug-2017) When compared with ECG of 21-Oct-2022 11:41, Non-specific change in ST segment in Anterior leads T wave inversion no  longer evident in Inferior leads T wave inversion less evident in Anterolateral leads Confirmed by Magnus Schuller ([id number]) on 05/22/2023 11:00:35 AM    I personally reviewed the ECG from November 10, 2019 which showed atrially paced rhythm with first-degree AV block and appear normal at 236 ms.  Ventricular rate 60.  QS complex V1 to V3.    December 21, 2018 ECG (independently read by me): Atrially paced at 78 bpm with prolonged AV conduction with a PR interval of 296 ms.  R wave progression anteriorly.  ECG from Jun 07, 2017 was independently reviewed by me which reveals atrially paced rhythm.  Poor anterior R wave progression.  LABS:     Latest Ref Rng & Units 10/21/2022   12:09 PM 10/09/2022    3:11 AM 10/08/2022    2:19 PM  BMP  Glucose 70 - 99 mg/dL 604  540  981   BUN 8 - 23 mg/dL 30  48  47   Creatinine 0.44 - 1.00 mg/dL 1.91  4.78  2.95   Sodium 135 - 145 mmol/L 136  127  127   Potassium 3.5 - 5.1 mmol/L 5.0  4.2  4.7   Chloride 98 - 111 mmol/L 98  94  89   CO2 22 - 32 mmol/L 28  24  27    Calcium  8.9 - 10.3 mg/dL 9.7  8.8  9.6         Latest Ref Rng & Units 10/08/2022    5:44 AM 10/07/2022    2:40 AM 09/03/2020   12:24 PM  Hepatic Function  Total Protein 6.5 - 8.1 g/dL   6.8   Albumin 3.5 - 5.0 g/dL 2.5  2.6  2.7   AST 15 - 41 U/L   13   ALT 0 - 44 U/L   13   Alk Phosphatase 38 - 126 U/L   38   Total Bilirubin 0.3 - 1.2 mg/dL   0.3         Latest Ref Rng & Units 10/04/2022   12:29 PM 09/03/2020   12:24 PM 07/26/2020   11:15 AM  CBC  WBC 4.0 - 10.5 K/uL 5.4  8.1  7.9  Hemoglobin 12.0 - 15.0 g/dL 21.3  9.9  08.6   Hematocrit 36.0 - 46.0 % 37.5  31.0  41.7   Platelets 150 - 400 K/uL 168  223  148      Lipid Panel     Component Value Date/Time   CHOL 168 08/29/2017 0328   TRIG 544 (H) 08/29/2017 0328   HDL 19 (L) 08/29/2017 0328   CHOLHDL 8.8 08/29/2017 0328   VLDL UNABLE TO CALCULATE IF TRIGLYCERIDE OVER 400 mg/dL 57/84/6962 9528   LDLCALC UNABLE TO CALCULATE IF  TRIGLYCERIDE OVER 400 mg/dL 41/32/4401 0272     RADIOLOGY: No results found.  IMPRESSION:  1. Complex sleep apnea syndrome   2. OSA treated with BiPAP   3. Primary hypertension   4. Sinus node dysfunction (HCC)   5. Pacemaker  Medtronic  Medtronic Adapta Dr Jeanene Milder, leads only MRI  in situ 04/30/2015   6. Paroxysmal atrial fibrillation (HCC)   7. Chronic diastolic heart failure (HCC)   8. Anticoagulated on apixaban     ASSESSMENT AND PLAN: Ms. Kinisha Machi is an 78 year-old female who has documented to have complex sleep apnea with both obstructive and central events and has been on BiPAP therapy since 2013.  She has significant cardiovascular comorbidities and has been followed by [name] for her cardiac issues.  She is status post dual-chamber pacemaker in 2017 with history of sick sinus syndrome and has had paroxysmal atrial fibrillation.  After not having seen her in several years, I saw her in July 2019 at which time her BiPAP machine had been nonfunctional over several years.  On her follow-up sleep evaluation she was found to have moderate overall sleep apnea which was very severe during REM sleep with significant oxygen  desaturation to 73%.  She received a ResMed air sense 10 BiPAP unit with set up date on October 19, 2017.  She has been consistently compliant but has had issues with inadequate sleep duration.  When I saw her in December 2020 she again was compliant and AHI was excellent at 1.2.  At that time I reduced her ramp time and increased her ramp start up to 6.  I discussed optimal sleep duration at 8 hours.  She had been using CPAP fairly regularly but apparently stopped  2 to 3 months ago when her machine stopped working.  She is now in need for a new machine.  I will schedule her to receive a ResMed air curve 11 V Auto BiPAP unit and we will try to expedite this.  I will set her up with an EPAP minimum of 8, pressure support of 4 with IPAP maximum of 20.  I discussed with her my  retirement plans at the end of June.  She will need to be seen within 90 days of receiving her new device and I will schedule her follow-up sleep evaluation with Dr. Micael Adas following her new machine.  Presently, she continues to be on amiodarone  100 mg and Eliquis  for anticoagulation.  She is on metoprolol  tartrate 50 mg twice a day, olmesartan  40 mg daily, spironolactone  25 mg and also takes isosorbide  60 mg daily.  She is diabetic on Farxiga  in addition to insulin .  She will follow-up with Dr. Ganji for cardiology care.   Millicent Ally, MD, [facility], ABSM Diplomate, American Board of Sleep Medicine  05/22/2023 11:13 AM

## 2023-05-20 NOTE — Patient Instructions (Signed)
 Medication Instructions:  NO CHANGES *If you need a refill on your cardiac medications before your next appointment, please call your pharmacy*  Lab Work: NO LABS If you have labs (blood work) drawn today and your tests are completely normal, you will receive your results only by: MyChart Message (if you have MyChart) OR A paper copy in the mail If you have any lab test that is abnormal or we need to change your treatment, we will call you to review the results.  Testing/Procedures: NO TESTING  Follow-Up: At [facility], you and your health needs are our priority.  As part of our continuing mission to provide you with exceptional heart care, our providers are all part of one team.  This team includes your primary Cardiologist (physician) and Advanced Practice Providers or APPs (Physician Assistants and Nurse Practitioners) who all work together to provide you with the care you need, when you need it.  Your next appointment:   3-4 month(s)  Provider:   Gaylyn Keas, MD  We recommend signing up for the patient portal called "MyChart".  Sign up information is provided on this After Visit Summary.  MyChart is used to connect with patients for Virtual Visits (Telemedicine).  Patients are able to view lab/test results, encounter notes, upcoming appointments, etc.  Non-urgent messages can be sent to your provider as well.   To learn more about what you can do with MyChart, go to [email or web address].

## 2023-05-26 DIAGNOSIS — E1165 Type 2 diabetes mellitus with hyperglycemia: Secondary | ICD-10-CM | POA: Diagnosis not present

## 2023-05-29 ENCOUNTER — Ambulatory Visit: Admitting: Cardiovascular Disease

## 2023-06-01 ENCOUNTER — Encounter: Payer: Self-pay | Admitting: Internal Medicine

## 2023-06-01 ENCOUNTER — Ambulatory Visit: Admitting: Internal Medicine

## 2023-06-01 VITALS — BP 135/68 | HR 96 | Temp 97.7°F | Ht 62.0 in | Wt 168.0 lb

## 2023-06-01 DIAGNOSIS — R159 Full incontinence of feces: Secondary | ICD-10-CM | POA: Diagnosis not present

## 2023-06-01 DIAGNOSIS — R195 Other fecal abnormalities: Secondary | ICD-10-CM

## 2023-06-01 DIAGNOSIS — R197 Diarrhea, unspecified: Secondary | ICD-10-CM | POA: Diagnosis not present

## 2023-06-01 NOTE — Progress Notes (Signed)
 Primary Care Physician:  Imelda Man, MD Primary Gastroenterologist:  Dr.   Pre-Procedure History & Physical: HPI:  [name] is a 78 y.o. female here for follow-up of a 1 year history of diarrhea-nonbloody.  Workup today includes negative GIP no C. difficile. Colon biopsies negative for microscopic colitis.  She has a history of diabetes;  she takes olmesartan .  Her more recent regimen includes cholestyramine  4 g daily and 2 Imodium  at night and again in the morning.  Still notes that she has episodes of diarrhea sudden urge to have a bowel movement frequently has to just take her clothes off in the morning and go get in the shower because she has incontinence.  Bowel function normal at other times.  Says cholestyramine  has helped but symptoms have not improved to a satisfactory degree.  Of note, her cardiologist Dr. Berry Bristol suggest that she stop the olmesartan  for 2-week trial and see if it made any difference in her episodes of diarrhea.  She states it did not.  Feels good otherwise.  Appetite is good.  Past Medical History:  Diagnosis Date   Arthritis    CAD (coronary artery disease)    Diabetes mellitus without complication (HCC)    Diastolic heart failure (HCC)    Dyspnea    Encounter for care of pacemaker 09/03/2018   Family history of kidney cancer    Family history of throat cancer    Family history of uterine cancer    History of chickenpox    History of radiation therapy    Pelvis 10/23/20-12/14/20- Dr. Retta Caster   Hx of psoriatic arthritis    Hyperlipemia    Hypertension    Hypertension 05/03/2018   Hypothyroidism    Mitral valve disorders(424.0)    Myocardial infarction (HCC)    Obstructive sleep apnea    OSA (obstructive sleep apnea)    using BiPAP   Pacemaker    Paroxysmal atrial fibrillation (HCC) 10/11/2016   Peripheral neuropathy    Renal disorder Kidney disease stage2   Sick sinus syndrome [facility])    S/p dual-chamber pacemaker   Thyroid  disease  hypothyroid   Urine incontinence    Vitamin D  deficiency    Vulvar cancer (HCC) 2022   s/p surgery and radiation therapy October-December 2022    Past Surgical History:  Procedure Laterality Date   BACK SURGERY     BACK SURGERY     BIOPSY  11/20/2021   Procedure: BIOPSY;  Surgeon: Suzette Espy, MD;  Location: [facility];  Service: Endoscopy;;   CARDIAC CATHETERIZATION N/A 04/29/2015   Procedure: Temporary Pacemaker;  Surgeon: Knox Perl, MD;  Location: [facility] INVAS[facility] CV LAB;  Service: Cardiovascular;  Laterality: N/A;   CARDIOVERSION N/A 09/07/2018   Procedure: CARDIOVERSION;  Surgeon: Knox Perl, MD;  Location: [facility] ENDOSCOPY;  Service: Cardiovascular;  Laterality: N/A;   CARDIOVERSION     COLONOSCOPY  08/16/2008   Dr. Riley Cheadle; pancolonic diverticulosis, otherwise no abnormalities.  Consider repeat in 10 years.   COLONOSCOPY WITH PROPOFOL  N/A 11/20/2021   Procedure: COLONOSCOPY WITH PROPOFOL ;  Surgeon: Suzette Espy, MD;  Location: [facility];  Service: Endoscopy;  Laterality: N/A;  8:15am, asa 3   CORONARY PRESSURE/FFR STUDY N/A 08/31/2017   Procedure: INTRAVASCULAR PRESSURE WIRE/FFR STUDY;  Surgeon: Cody Das, MD;  Location: [facility] INV[facility] CV LAB;  Service: Cardiovascular;  Laterality: N/A;   EP IMPLANTABLE DEVICE N/A 04/30/2015   Procedure: Pacemaker Implant;  Surgeon: Tammie Fall, MD;  Location: [facility];  Service: Cardiovascular;  Laterality: N/A;   LEFT AND RIGHT HEART CATHETERIZATION WITH CORONARY ANGIOGRAM N/A 09/30/2011   Procedure: LEFT AND RIGHT HEART CATHETERIZATION WITH CORONARY ANGIOGRAM;  Surgeon: Jessica Morn, MD;  Location: [facility] CATH LAB;  Service: Cardiovascular;  Laterality: N/A;   POLYPECTOMY  11/20/2021   Procedure: POLYPECTOMY;  Surgeon: Suzette Espy, MD;  Location: [facility];  Service: Endoscopy;;   RIGHT/LEFT HEART CATH AND CORONARY ANGIOGRAPHY N/A 08/31/2017   Procedure: RIGHT/LEFT HEART CATH AND CORONARY ANGIOGRAPHY;  Surgeon:  Cody Das, MD;  Location: [facility];  Service: Cardiovascular;  Laterality: N/A;   TOOTH EXTRACTION  10/07/2018   TUBAL LIGATION     VULVA Asher Blade BIOPSY N/A 07/26/2020   Procedure: VULVAR BIOPSY;  Surgeon: Suzi Essex, MD;  Location: [facility] ORS;  Service: Gynecology;  Laterality: N/A;   YAG LASER APPLICATION Right 03/07/2014   Procedure: YAG LASER APPLICATION;  Surgeon: Lavonia Powers T. Gennie Kicks, MD;  Location: [facility] ORS;  Service: Ophthalmology;  Laterality: Right;  pt knows to arrive at 11:15   YAG LASER APPLICATION Left 03/21/2014   Procedure: YAG LASER APPLICATION;  Surgeon: Albert Huff, MD;  Location: [facility] ORS;  Service: Ophthalmology;  Laterality: Left;    Prior to Admission medications   Medication Sig Start Date End Date Taking? Authorizing Provider  amiodarone  (PACERONE ) 200 MG tablet Take 1/2 (one-half) tablet by mouth once daily 05/14/23  Yes Knox Perl, MD  apixaban  (ELIQUIS ) 2.5 MG TABS tablet Take 1 tablet (2.5 mg total) by mouth 2 (two) times daily. 04/14/23  Yes Knox Perl, MD  Blood Glucose Monitoring Suppl (ACCU-CHEK AVIVA PLUS) w/Device KIT    Yes [provider]  cholecalciferol  (VITAMIN D3) 25 MCG (1000 UNIT) tablet Take 2,000 Units by mouth in the morning.   Yes [provider]  cholestyramine  (QUESTRAN ) 4 g packet Take 1 packet (4 g total) by mouth daily. 03/30/23  Yes Jenasis Straley, Windsor Hatcher, MD  clobetasol  cream (TEMOVATE ) 0.05 % Apply 1 Application topically 2 (two) times daily as needed (rash).   Yes [provider]  Continuous Glucose Sensor (DEXCOM G7 SENSOR) MISC  09/17/22  Yes [provider]  dapagliflozin  propanediol (FARXIGA ) 10 MG TABS tablet Take 1 tablet (10 mg total) by mouth daily before breakfast. 03/18/23  Yes Knox Perl, MD  fenofibrate  160 MG tablet Take 160 mg by mouth daily with supper.    Yes [provider]  insulin  aspart protamine- aspart (NOVOLOG  MIX 70/30) (70-30) 100 UNIT/ML injection Inject 0.1-0.2 mLs  (10-20 Units total) into the skin See admin instructions. Inject 20 units subcutaneously with breakfast & inject 10 units subcutaneously with supper (may adjust based on blood sugar readings) 10/09/22  Yes Deforest Fast, MD  isosorbide  mononitrate (IMDUR ) 60 MG 24 hr tablet Take 1 tablet by mouth once daily 01/16/23  Yes Knox Perl, MD  levothyroxine  (SYNTHROID ) 150 MCG tablet Take 150 mcg by mouth every morning. 05/21/23  Yes [provider]  lidocaine  (XYLOCAINE ) 5 % ointment Apply 1 Application topically 3 (three) times daily as needed. 02/27/23  Yes Suzi Essex, MD  loperamide  (IMODIUM ) 2 MG capsule Take 4 mg by mouth 2 (two) times daily.   Yes [provider]  metoprolol  tartrate (LOPRESSOR ) 50 MG tablet Take 1 tablet by mouth twice daily 03/09/23  Yes Knox Perl, MD  nitroGLYCERIN  (NITROSTAT ) 0.4 MG SL tablet Place 1 tablet (0.4 mg total) under the tongue every 5 (five) minutes  as needed for chest pain. 10/01/15  Yes Pasqual Bone, MD  olmesartan  (BENICAR ) 40 MG tablet Take 40 mg by mouth daily. 10/27/22  Yes [provider]  spironolactone  (ALDACTONE ) 25 MG tablet Take 1 tablet by mouth once daily 12/30/22  Yes Knox Perl, MD  torsemide  (DEMADEX ) 20 MG tablet Take 1 tablet (20 mg total) by mouth daily as needed (Take higher dose as needed for fluid). Alternate 10mg  with 20mg  daily 01/12/23  Yes Knox Perl, MD    Allergies as of 06/01/2023 - Review Complete 06/01/2023  Allergen Reaction Noted   Hydrocodone  Other (See Comments) May 28, 2015   Invokana [canagliflozin] Palpitations and Other (See Comments) May 28, 2015   Oxycodone Other (See Comments) May 28, 2015    Family History  Problem Relation Age of Onset   Arthritis Mother    Diabetes Mother    Heart disease Mother    Hyperlipidemia Mother    Hypertension Mother    Arthritis Father    Asthma Father    Heart attack Father    Hyperlipidemia Father    Hypertension Father    Stroke Father    Arthritis  Sister    Diabetes Sister    Hypertension Sister    Arthritis Sister    Diabetes Sister    Hyperlipidemia Sister    Hypertension Sister    Stroke Sister    Ovarian cancer Sister 45   Pancreatic cancer Sister 10   Uterine cancer Sister 28   Hyperlipidemia Sister    Hypertension Sister    Arthritis Brother    Heart attack Brother    Heart disease Brother    Hyperlipidemia Brother    Hypertension Brother    Alcohol abuse Brother    Arthritis Brother    Diabetes Brother    Early death Brother    Heart disease Brother    Hyperlipidemia Brother    Hypertension Brother    Cancer Maternal Aunt 30       unknown type   Throat cancer Maternal Grandfather        hx smoking/drinking   Hypertension Daughter    Cancer Daughter        "female cancer cells"   Kidney cancer Nephew        dx 60s   Colon cancer Neg Hx    Breast cancer Neg Hx    Prostate cancer Neg Hx     Social History   Socioeconomic History   Marital status: Widowed    Spouse name: Not on file   Number of children: 3   Years of education: Not on file   Highest education level: Not on file  Occupational History   Occupation: Retired  Tobacco Use   Smoking status: Never   Smokeless tobacco: Never  Vaping Use   Vaping status: Never Used  Substance and Sexual Activity   Alcohol use: No    Alcohol/week: 0.0 standard drinks of alcohol   Drug use: No   Sexual activity: Not Currently    Birth control/protection: Post-menopausal  Other Topics Concern   Not on file  Social History Narrative   Husband recently passed away on 27-May-2020.   Social Drivers of Corporate investment banker Strain: Low Risk  (10/06/2022)   Overall Financial Resource Strain (CARDIA)    Difficulty of Paying Living Expenses: Not hard at all  Food Insecurity: No Food Insecurity (10/04/2022)   Hunger Vital Sign    Worried About Running Out of Food in the Last Year: Never true  Ran Out of Food in the Last Year: Never true   Transportation Needs: No Transportation Needs (10/06/2022)   PRAPARE - Administrator, Civil Service (Medical): No    Lack of Transportation (Non-Medical): No  Physical Activity: Not on file  Stress: Not on file  Social Connections: Not on file  Intimate Partner Violence: Not At Risk (10/04/2022)   Humiliation, Afraid, Rape, and Kick questionnaire    Fear of Current or Ex-Partner: No    Emotionally Abused: No    Physically Abused: No    Sexually Abused: No    Review of Systems: See HPI, otherwise negative ROS  Physical Exam: BP 135/68 (BP Location: Right Arm, Patient Position: Sitting, Cuff Size: Normal)   Pulse 96   Temp 97.7 F (36.5 C) (Oral)   Ht 5\' 2"  (1.575 m)   Wt 168 lb (76.2 kg)   SpO2 95%   BMI 30.73 kg/m  General:   Alert,  Well-developed, well-nourished, pleasant and cooperative in NAD Lungs:  Clear throughout to auscultation.   No wheezes, crackles, or rhonchi. No acute distress. Heart:  Regular rate and rhythm; no murmurs, clicks, rubs,  or gallops. Abdomen: Non-distended, normal bowel sounds.  Soft and nontender without appreciable mass or hepatosplenomegaly.   Impression/Plan: Intermittent nonbloody diarrhea with episodes of incontinence in this very pleasant 78 year old lady.  Cholestyramine  has helped.  Add on Imodium  did not add much. Dr. Berry Bristol had a very good thought of holding the olmesartan  as it has been associated with a celiac like enteritis.  It was held for 2 weeks without apparent improvement.  No evidence of infection or microscopic colitis on previous studies.  No obvious medications to implicate.  May be enteropathy related to longstanding diabetes.  Pelvic floor laxity could also be playing a role.  Recommendations:   Continue Questran  or cholestyramine  4 g daily  Stop Imodium   New medication dicyclomine 20 mg tablet take 1 at bedtime and another in the morning when you get out of bed.  Dispense 60 with 3 refills  Keep a "stool  diary" on a calendar and bring it with you next time  Office visit here in 6 weeks    Notice: This dictation was prepared with Dragon dictation along with smaller phrase technology. Any transcriptional errors that result from this process are unintentional and may not be corrected upon review.

## 2023-06-01 NOTE — Patient Instructions (Signed)
 It was good to see you again today!  Continue Questran  or cholestyramine  4 g daily  Stop Imodium   New medication dicyclomine 20 mg tablet take 1 at bedtime and another in the morning when you get out of bed.  Dispense 60 with 3 refills  Keep a "stool diary" on a calendar and bring it with you next time  Office visit here in 6 weeks

## 2023-06-03 ENCOUNTER — Other Ambulatory Visit: Payer: Self-pay

## 2023-06-03 MED ORDER — DICYCLOMINE HCL 20 MG PO TABS: 20.0000 mg | ORAL_TABLET | Freq: Two times a day (BID) | ORAL | 3 refills | Status: DC

## 2023-06-22 ENCOUNTER — Other Ambulatory Visit: Payer: Self-pay | Admitting: Cardiology

## 2023-06-26 DIAGNOSIS — E1165 Type 2 diabetes mellitus with hyperglycemia: Secondary | ICD-10-CM | POA: Diagnosis not present

## 2023-06-30 ENCOUNTER — Ambulatory Visit (INDEPENDENT_AMBULATORY_CARE_PROVIDER_SITE_OTHER): Payer: Medicare HMO

## 2023-06-30 DIAGNOSIS — I495 Sick sinus syndrome: Secondary | ICD-10-CM

## 2023-07-01 DIAGNOSIS — E1122 Type 2 diabetes mellitus with diabetic chronic kidney disease: Secondary | ICD-10-CM | POA: Diagnosis not present

## 2023-07-01 DIAGNOSIS — I129 Hypertensive chronic kidney disease with stage 1 through stage 4 chronic kidney disease, or unspecified chronic kidney disease: Secondary | ICD-10-CM | POA: Diagnosis not present

## 2023-07-02 ENCOUNTER — Ambulatory Visit: Payer: Self-pay | Admitting: Internal Medicine

## 2023-07-02 LAB — CUP PACEART REMOTE DEVICE CHECK
Battery Impedance: 767 Ohm
Battery Remaining Longevity: 79 mo
Battery Voltage: 2.78 V
Brady Statistic AP VP Percent: 0 %
Brady Statistic AP VS Percent: 100 %
Brady Statistic AS VP Percent: 0 %
Brady Statistic AS VS Percent: 0 %
Date Time Interrogation Session: 20250618130726
Implantable Lead Connection Status: 753985
Implantable Lead Connection Status: 753985
Implantable Lead Implant Date: 20170417
Implantable Lead Implant Date: 20170417
Implantable Lead Location: 753859
Implantable Lead Location: 753860
Implantable Lead Model: 5076
Implantable Lead Model: 5076
Implantable Pulse Generator Implant Date: 20170417
Lead Channel Impedance Value: 403 Ohm
Lead Channel Impedance Value: 487 Ohm
Lead Channel Pacing Threshold Amplitude: 0.5 V
Lead Channel Pacing Threshold Amplitude: 0.75 V
Lead Channel Pacing Threshold Pulse Width: 0.4 ms
Lead Channel Pacing Threshold Pulse Width: 0.4 ms
Lead Channel Setting Pacing Amplitude: 1.5 V
Lead Channel Setting Pacing Amplitude: 2 V
Lead Channel Setting Pacing Pulse Width: 0.4 ms
Lead Channel Setting Sensing Sensitivity: 5.6 mV
Zone Setting Status: 755011
Zone Setting Status: 755011

## 2023-07-07 DIAGNOSIS — E1122 Type 2 diabetes mellitus with diabetic chronic kidney disease: Secondary | ICD-10-CM | POA: Diagnosis not present

## 2023-07-07 DIAGNOSIS — E11622 Type 2 diabetes mellitus with other skin ulcer: Secondary | ICD-10-CM | POA: Diagnosis not present

## 2023-07-07 DIAGNOSIS — G4733 Obstructive sleep apnea (adult) (pediatric): Secondary | ICD-10-CM | POA: Diagnosis not present

## 2023-07-07 DIAGNOSIS — N1832 Chronic kidney disease, stage 3b: Secondary | ICD-10-CM | POA: Diagnosis not present

## 2023-07-07 DIAGNOSIS — Z0000 Encounter for general adult medical examination without abnormal findings: Secondary | ICD-10-CM | POA: Diagnosis not present

## 2023-07-07 DIAGNOSIS — I110 Hypertensive heart disease with heart failure: Secondary | ICD-10-CM | POA: Diagnosis not present

## 2023-07-07 DIAGNOSIS — I480 Paroxysmal atrial fibrillation: Secondary | ICD-10-CM | POA: Diagnosis not present

## 2023-07-07 DIAGNOSIS — I25119 Atherosclerotic heart disease of native coronary artery with unspecified angina pectoris: Secondary | ICD-10-CM | POA: Diagnosis not present

## 2023-07-07 DIAGNOSIS — E559 Vitamin D deficiency, unspecified: Secondary | ICD-10-CM | POA: Diagnosis not present

## 2023-07-07 DIAGNOSIS — D6859 Other primary thrombophilia: Secondary | ICD-10-CM | POA: Diagnosis not present

## 2023-07-07 DIAGNOSIS — E039 Hypothyroidism, unspecified: Secondary | ICD-10-CM | POA: Diagnosis not present

## 2023-07-14 ENCOUNTER — Other Ambulatory Visit: Payer: Self-pay | Admitting: Cardiology

## 2023-07-14 ENCOUNTER — Encounter: Payer: Self-pay | Admitting: Gastroenterology

## 2023-07-14 ENCOUNTER — Ambulatory Visit: Admitting: Gastroenterology

## 2023-07-14 VITALS — BP 134/68 | HR 64 | Temp 98.0°F | Ht 62.0 in | Wt 172.4 lb

## 2023-07-14 DIAGNOSIS — R197 Diarrhea, unspecified: Secondary | ICD-10-CM

## 2023-07-14 DIAGNOSIS — Z860101 Personal history of adenomatous and serrated colon polyps: Secondary | ICD-10-CM | POA: Diagnosis not present

## 2023-07-14 DIAGNOSIS — R159 Full incontinence of feces: Secondary | ICD-10-CM

## 2023-07-14 DIAGNOSIS — K529 Noninfective gastroenteritis and colitis, unspecified: Secondary | ICD-10-CM

## 2023-07-14 DIAGNOSIS — R152 Fecal urgency: Secondary | ICD-10-CM

## 2023-07-14 DIAGNOSIS — I2510 Atherosclerotic heart disease of native coronary artery without angina pectoris: Secondary | ICD-10-CM

## 2023-07-14 NOTE — Progress Notes (Signed)
 GI Office Note    Referring Provider: Clarice Nottingham, MD Primary Care Physician:  Clarice Nottingham, MD  Primary Gastroenterologist: Ozell Hollingshead, MD   Chief Complaint   Chief Complaint  Patient presents with   Follow-up    Here for follow up on fecal incontinence and urgency. States doing much better since starting dicyclomine .     History of Present Illness   [name] is a 78 y.o. female presenting today for folllow up. Last seen 05/2023. History of vulvar cancer 2022 s/p radiation thereapy with complaints of constant fecal leakage, urgency/tenesmus since radiation.   Cholesyramine 4g daily added 03/2023,  has helped. Imodium  not helpful. Holding omesartan two weeks not helpful. Added dicyclomine  20mg  BID at time of last OV and requested she keep stool diary.   Today: doing very well. States her bowels did so much better after starting dicyclomine . Noted a significant improvement after the first pill. She stopped imodium  and stopped cholestyramine . She did not realize she was supposed to stay on cholestyramine .   Now having BM once daily. Much more control of stools, less fecal incontinence. No melena, brbpr, no abd pain. No nocturnal stools. Weight stable. No heartburn. Drinking plenty of liquids. No side effects noted with dicyclomine . No dry mouth, drowsiness, or dizziness.      Wt Readings from Last 9 Encounters:  07/14/23 172 lb 6.4 oz (78.2 kg)  06/01/23 168 lb (76.2 kg)  05/20/23 169 lb (76.7 kg)  03/30/23 172 lb (78 kg)  03/17/23 171 lb 3.2 oz (77.7 kg)  02/27/23 169 lb 12.8 oz (77 kg)  11/27/22 177 lb (80.3 kg)  10/21/22 177 lb 12.8 oz (80.6 kg)  10/09/22 176 lb 9.4 oz (80.1 kg)   Colonoscopy 11/2021: - Diverticulosis in the entire examined colon. - Six 2 to 6 mm polyps in the cecum. Hot and cold snare removed. Polyp ablation performed. - The examination was otherwise normal on direct and retroflexion views. - No specimens collected. -Rectal and random  colon biopsies negative.   -3 tubular adenomas removed. -Consider colonoscopy in 3 years if overall health permits   Medications   Current Outpatient Medications  Medication Sig Dispense Refill   amiodarone  (PACERONE ) 200 MG tablet Take 1/2 (one-half) tablet by mouth once daily 45 tablet 0   apixaban  (ELIQUIS ) 2.5 MG TABS tablet Take 1 tablet (2.5 mg total) by mouth 2 (two) times daily. 60 tablet 5   Blood Glucose Monitoring Suppl (ACCU-CHEK AVIVA PLUS) w/Device KIT      cholecalciferol  (VITAMIN D3) 25 MCG (1000 UNIT) tablet Take 2,000 Units by mouth in the morning.     clobetasol  cream (TEMOVATE ) 0.05 % Apply 1 Application topically 2 (two) times daily as needed (rash).     Continuous Glucose Sensor (DEXCOM G7 SENSOR) MISC      dapagliflozin  propanediol (FARXIGA ) 10 MG TABS tablet Take 1 tablet (10 mg total) by mouth daily before breakfast. 90 tablet 3   dicyclomine  (BENTYL ) 20 MG tablet Take 1 tablet (20 mg total) by mouth 2 (two) times daily. 60 tablet 3   fenofibrate  160 MG tablet Take 160 mg by mouth daily with supper.      insulin  aspart protamine- aspart (NOVOLOG  MIX 70/30) (70-30) 100 UNIT/ML injection Inject 0.1-0.2 mLs (10-20 Units total) into the skin See admin instructions. Inject 20 units subcutaneously with breakfast & inject 10 units subcutaneously with supper (may adjust based on blood sugar readings)     isosorbide  mononitrate (IMDUR ) 60 MG  24 hr tablet Take 1 tablet by mouth once daily 90 tablet 1   levothyroxine  (SYNTHROID ) 175 MCG tablet Take 175 mcg by mouth daily.     lidocaine  (XYLOCAINE ) 5 % ointment Apply 1 Application topically 3 (three) times daily as needed. 35.44 g 0   metoprolol  tartrate (LOPRESSOR ) 50 MG tablet Take 1 tablet by mouth twice daily 180 tablet 1   nitroGLYCERIN  (NITROSTAT ) 0.4 MG SL tablet Place 1 tablet (0.4 mg total) under the tongue every 5 (five) minutes as needed for chest pain. 25 tablet 1   olmesartan  (BENICAR ) 40 MG tablet Take 40 mg by  mouth daily.     spironolactone  (ALDACTONE ) 25 MG tablet Take 1 tablet by mouth once daily 90 tablet 3   torsemide  (DEMADEX ) 5 MG tablet Take 5 mg by mouth daily.     No current facility-administered medications for this visit.    Allergies   Allergies as of 07/14/2023 - Review Complete 07/14/2023  Allergen Reaction Noted   Hydrocodone  Other (See Comments) 04/29/2015   Invokana [canagliflozin] Palpitations and Other (See Comments) 04/29/2015   Oxycodone Other (See Comments) 04/29/2015      Review of Systems   General: Negative for anorexia, weight loss, fever, chills, fatigue, weakness. ENT: Negative for hoarseness, difficulty swallowing , nasal congestion. CV: Negative for chest pain, angina, palpitations, dyspnea on exertion, peripheral edema.  Respiratory: Negative for dyspnea at rest, dyspnea on exertion, cough, sputum, wheezing.  GI: See history of present illness. GU:  Negative for dysuria, hematuria, urinary incontinence, urinary frequency, nocturnal urination.  Endo: Negative for unusual weight change.     Physical Exam   BP 134/68 (BP Location: Right Arm, Patient Position: Sitting, Cuff Size: Normal)   Pulse 64   Temp 98 F (36.7 C) (Oral)   Ht 5' 2 (1.575 m)   Wt 172 lb 6.4 oz (78.2 kg)   SpO2 95%   BMI 31.53 kg/m    General: Well-nourished, well-developed in no acute distress.  Eyes: No icterus. Mouth: Oropharyngeal mucosa moist and pink   Abdomen: Bowel sounds are normal, nontender, nondistended, no hepatosplenomegaly or masses,  no abdominal bruits or hernia , no rebound or guarding.  Rectal: not performed Extremities: trace bilateral lower extremity edema. No clubbing or deformities. Neuro: Alert and oriented x 4   Skin: Warm and dry, no jaundice.   Psych: Alert and cooperative, normal mood and affect.  Labs   June 2025: White blood cell count 6400, hemoglobin 12.9, platelets 183,000, A1c 5.6.  March 2025: Total bilirubin 0.3, alk phos 40, AST 24,  ALT 16, albumin 4.2, creatinine 1.61, BUN 34 Imaging Studies   CUP PACEART REMOTE DEVICE CHECK Result Date: 07/02/2023 Pacemaker:  Scheduled remote reviewed. Normal device function.  Presenting rhythm: AP/VS Next remote 91 days. ML, CVRS   Assessment/Plan:   Diarrhea/fecal incontinence: -intermittent, noted since radiation for vulvar cancer in 2022 -colonoscopy 11/2021 with random colon biopsies negative -previous stool studies negative for infection -responded to cholestyramine  but not completely controlled -started dicyclomine  20mg  BID after last ov, stopped cholestyramine . Doing very well. Having one BM daily, much better control of stools. Very pleased with her response to dicyclomine .  -continue current regimen, if relapse in symptoms, would add back cholestyramine  as before -plan on return ov in six months or sooner if needed  H/O adenomatous colon polyps: -consider 3 year surveillance colonoscopy 11/2024 if health permits and patient agreeable     Sonny RAMAN. Ezzard, MHS, PA-C [facility]

## 2023-07-14 NOTE — Patient Instructions (Addendum)
 Continue dicyclomine  20mg  twice daily.  If you start having more frequent loose stools, you can add back the cholestyramine  powder 4 grams daily, do not take within two hours of other medications.   Return to the office in six months or call sooner if needed.

## 2023-07-21 ENCOUNTER — Encounter (HOSPITAL_COMMUNITY): Payer: Self-pay | Admitting: Emergency Medicine

## 2023-07-21 ENCOUNTER — Observation Stay (HOSPITAL_COMMUNITY)
Admission: EM | Admit: 2023-07-21 | Discharge: 2023-07-24 | Disposition: A | Discharge status: Home / Self Care | Attending: Internal Medicine | Admitting: Internal Medicine

## 2023-07-21 ENCOUNTER — Other Ambulatory Visit: Payer: Self-pay

## 2023-07-21 DIAGNOSIS — I2510 Atherosclerotic heart disease of native coronary artery without angina pectoris: Secondary | ICD-10-CM | POA: Diagnosis not present

## 2023-07-21 DIAGNOSIS — Z950 Presence of cardiac pacemaker: Secondary | ICD-10-CM | POA: Insufficient documentation

## 2023-07-21 DIAGNOSIS — E039 Hypothyroidism, unspecified: Secondary | ICD-10-CM | POA: Diagnosis not present

## 2023-07-21 DIAGNOSIS — K922 Gastrointestinal hemorrhage, unspecified: Secondary | ICD-10-CM | POA: Diagnosis not present

## 2023-07-21 DIAGNOSIS — E119 Type 2 diabetes mellitus without complications: Secondary | ICD-10-CM

## 2023-07-21 DIAGNOSIS — D62 Acute posthemorrhagic anemia: Secondary | ICD-10-CM | POA: Diagnosis not present

## 2023-07-21 DIAGNOSIS — K625 Hemorrhage of anus and rectum: Principal | ICD-10-CM | POA: Diagnosis present

## 2023-07-21 DIAGNOSIS — Z7901 Long term (current) use of anticoagulants: Secondary | ICD-10-CM | POA: Insufficient documentation

## 2023-07-21 DIAGNOSIS — Z794 Long term (current) use of insulin: Secondary | ICD-10-CM | POA: Diagnosis not present

## 2023-07-21 DIAGNOSIS — E1122 Type 2 diabetes mellitus with diabetic chronic kidney disease: Secondary | ICD-10-CM | POA: Insufficient documentation

## 2023-07-21 DIAGNOSIS — I130 Hypertensive heart and chronic kidney disease with heart failure and stage 1 through stage 4 chronic kidney disease, or unspecified chronic kidney disease: Secondary | ICD-10-CM | POA: Insufficient documentation

## 2023-07-21 DIAGNOSIS — I480 Paroxysmal atrial fibrillation: Secondary | ICD-10-CM | POA: Diagnosis present

## 2023-07-21 DIAGNOSIS — Z79899 Other long term (current) drug therapy: Secondary | ICD-10-CM | POA: Diagnosis not present

## 2023-07-21 DIAGNOSIS — Z8544 Personal history of malignant neoplasm of other female genital organs: Secondary | ICD-10-CM | POA: Insufficient documentation

## 2023-07-21 DIAGNOSIS — N1832 Chronic kidney disease, stage 3b: Secondary | ICD-10-CM | POA: Diagnosis present

## 2023-07-21 DIAGNOSIS — I10 Essential (primary) hypertension: Secondary | ICD-10-CM | POA: Diagnosis not present

## 2023-07-21 DIAGNOSIS — I5032 Chronic diastolic (congestive) heart failure: Secondary | ICD-10-CM | POA: Diagnosis not present

## 2023-07-21 LAB — CBC
HCT: 41.6 % (ref 36.0–46.0)
Hemoglobin: 13.1 g/dL (ref 12.0–15.0)
MCH: 31.3 pg (ref 26.0–34.0)
MCHC: 31.5 g/dL (ref 30.0–36.0)
MCV: 99.5 fL (ref 80.0–100.0)
Platelets: 151 K/uL (ref 150–400)
RBC: 4.18 MIL/uL (ref 3.87–5.11)
RDW: 14.6 % (ref 11.5–15.5)
WBC: 7.6 K/uL (ref 4.0–10.5)
nRBC: 0 % (ref 0.0–0.2)

## 2023-07-21 LAB — COMPREHENSIVE METABOLIC PANEL WITH GFR
ALT: 16 U/L (ref 0–44)
AST: 20 U/L (ref 15–41)
Albumin: 3.5 g/dL (ref 3.5–5.0)
Alkaline Phosphatase: 33 U/L — ABNORMAL LOW (ref 38–126)
Anion gap: 12 (ref 5–15)
BUN: 33 mg/dL — ABNORMAL HIGH (ref 8–23)
CO2: 27 mmol/L (ref 22–32)
Calcium: 10.1 mg/dL (ref 8.9–10.3)
Chloride: 98 mmol/L (ref 98–111)
Creatinine, Ser: 1.55 mg/dL — ABNORMAL HIGH (ref 0.44–1.00)
GFR, Estimated: 34 mL/min — ABNORMAL LOW (ref >60)
Glucose, Bld: 142 mg/dL — ABNORMAL HIGH (ref 70–99)
Potassium: 4.2 mmol/L (ref 3.5–5.1)
Sodium: 137 mmol/L (ref 135–145)
Total Bilirubin: 0.7 mg/dL (ref 0.0–1.2)
Total Protein: 7.5 g/dL (ref 6.5–8.1)

## 2023-07-21 LAB — TYPE AND SCREEN
ABO/RH(D): A POS
Antibody Screen: NEGATIVE

## 2023-07-21 LAB — POC OCCULT BLOOD, ED: Fecal Occult Blood: POSITIVE

## 2023-07-21 LAB — PROTIME-INR
INR: 1.1 (ref 0.8–1.2)
Prothrombin Time: 14.8 s (ref 11.4–15.2)

## 2023-07-21 LAB — CBG MONITORING, ED: Glucose-Capillary: 121 mg/dL — ABNORMAL HIGH (ref 70–99)

## 2023-07-21 MED ORDER — LEVOTHYROXINE SODIUM 50 MCG PO TABS
175.0000 ug | ORAL_TABLET | Freq: Every day | ORAL | Status: DC
  Administered 2023-07-22 – 2023-07-24 (×3): 175 ug via ORAL
  Filled 2023-07-21: qty 4
  Filled 2023-07-21 (×2): qty 1

## 2023-07-21 MED ORDER — ONDANSETRON HCL 4 MG/2ML IJ SOLN: 4.0000 mg | Freq: Four times a day (QID) | INTRAMUSCULAR | Status: DC | PRN

## 2023-07-21 MED ORDER — SODIUM CHLORIDE 0.9% FLUSH
3.0000 mL | Freq: Two times a day (BID) | INTRAVENOUS | Status: DC
  Administered 2023-07-22 – 2023-07-24 (×5): 3 mL via INTRAVENOUS

## 2023-07-21 MED ORDER — METOPROLOL TARTRATE 50 MG PO TABS
50.0000 mg | ORAL_TABLET | Freq: Two times a day (BID) | ORAL | Status: DC
  Administered 2023-07-21 – 2023-07-24 (×6): 50 mg via ORAL
  Filled 2023-07-21 (×6): qty 1

## 2023-07-21 MED ORDER — IRBESARTAN 150 MG PO TABS
300.0000 mg | ORAL_TABLET | Freq: Every day | ORAL | Status: DC
  Administered 2023-07-22 – 2023-07-24 (×3): 300 mg via ORAL
  Filled 2023-07-21 (×3): qty 2

## 2023-07-21 MED ORDER — SPIRONOLACTONE 25 MG PO TABS
25.0000 mg | ORAL_TABLET | Freq: Every day | ORAL | Status: DC
  Administered 2023-07-22 – 2023-07-24 (×3): 25 mg via ORAL
  Filled 2023-07-21 (×3): qty 1

## 2023-07-21 MED ORDER — ONDANSETRON HCL 4 MG PO TABS: 4.0000 mg | ORAL_TABLET | Freq: Four times a day (QID) | ORAL | Status: DC | PRN

## 2023-07-21 MED ORDER — ACETAMINOPHEN 325 MG PO TABS: 650.0000 mg | ORAL_TABLET | Freq: Four times a day (QID) | ORAL | Status: DC | PRN

## 2023-07-21 MED ORDER — AMIODARONE HCL 200 MG PO TABS
100.0000 mg | ORAL_TABLET | Freq: Every day | ORAL | Status: DC
  Administered 2023-07-22 – 2023-07-24 (×3): 100 mg via ORAL
  Filled 2023-07-21 (×3): qty 1

## 2023-07-21 MED ORDER — TORSEMIDE 10 MG PO TABS
5.0000 mg | ORAL_TABLET | Freq: Every day | ORAL | Status: DC
  Administered 2023-07-22 – 2023-07-24 (×3): 5 mg via ORAL
  Filled 2023-07-21: qty 0.5
  Filled 2023-07-21: qty 1
  Filled 2023-07-21 (×3): qty 0.5
  Filled 2023-07-21: qty 1

## 2023-07-21 MED ORDER — ISOSORBIDE MONONITRATE ER 60 MG PO TB24
60.0000 mg | ORAL_TABLET | Freq: Every day | ORAL | Status: DC
  Administered 2023-07-22 – 2023-07-24 (×3): 60 mg via ORAL
  Filled 2023-07-21 (×3): qty 1

## 2023-07-21 MED ORDER — ACETAMINOPHEN 650 MG RE SUPP: 650.0000 mg | Freq: Four times a day (QID) | RECTAL | Status: DC | PRN

## 2023-07-21 MED ORDER — INSULIN ASPART 100 UNIT/ML IJ SOLN
0.0000 [IU] | INTRAMUSCULAR | Status: DC
  Administered 2023-07-22 (×2): 1 [IU] via SUBCUTANEOUS
  Administered 2023-07-22: 2 [IU] via SUBCUTANEOUS
  Administered 2023-07-23: 1 [IU] via SUBCUTANEOUS
  Administered 2023-07-23: 2 [IU] via SUBCUTANEOUS
  Filled 2023-07-21: qty 1

## 2023-07-21 NOTE — H&P (Signed)
 History and Physical    [name] FMW:[id number] DOB: 1945/10/17 DOA: 07/21/2023  PCP: Clarice Nottingham, MD   Patient coming from: Home   Chief Complaint: Rectal bleeding  HPI: [name] is an 78 y.o. female with medical history significant for hypertension, type 2 diabetes mellitus, sick sinus syndrome with pacer, hypothyroidism, chronic HFpEF, PAF on Eliquis , and chronic diarrhea who presents with rectal bleeding.  Patient reports that yesterday afternoon, she felt as though she was going to have diarrhea but passed mainly just a large volume of red blood.  She had another episode this afternoon that was very similar and she also saw some clots.  She had 2 more episodes while making her way to the ED.  She has some lightheadedness associated with this but denies syncope, abdominal pain, nausea, vomiting, fever, or chills.  She has never experienced this previously.  On colonoscopy in November 2023, diverticulosis was noted throughout the colon.  ED Course: Upon arrival to the ED, patient is found to be afebrile and saturating low 90s on room air with normal RR, normal HR, and stable BP.  Labs are most notable for creatinine 1.55, normal WBC, normal hemoglobin, normal platelets, and positive FOBT.  Review of Systems:  All other systems reviewed and apart from HPI, are negative.  Past Medical History:  Diagnosis Date   Arthritis    CAD (coronary artery disease)    Diabetes mellitus without complication (HCC)    Diastolic heart failure (HCC)    Dyspnea    Encounter for care of pacemaker 09/03/2018   Family history of kidney cancer    Family history of throat cancer    Family history of uterine cancer    History of chickenpox    History of radiation therapy    Pelvis 10/23/20-12/14/20- Dr. Lynwood Nasuti   Hx of psoriatic arthritis    Hyperlipemia    Hypertension    Hypertension 05/03/2018   Hypothyroidism    Mitral valve disorders(424.0)    Myocardial infarction (HCC)     Obstructive sleep apnea    OSA (obstructive sleep apnea)    using BiPAP   Pacemaker    Paroxysmal atrial fibrillation (HCC) 10/11/2016   Peripheral neuropathy    Renal disorder Kidney disease stage2   Sick sinus syndrome [facility])    S/p dual-chamber pacemaker   Thyroid  disease hypothyroid   Urine incontinence    Vitamin D  deficiency    Vulvar cancer (HCC) 2022   s/p surgery and radiation therapy October-December 2022    Past Surgical History:  Procedure Laterality Date   BACK SURGERY     BACK SURGERY     BIOPSY  11/20/2021   Procedure: BIOPSY;  Surgeon: Shaaron Lamar HERO, MD;  Location: [facility];  Service: Endoscopy;;   CARDIAC CATHETERIZATION N/A 04/29/2015   Procedure: Temporary Pacemaker;  Surgeon: Gordy Bergamo, MD;  Location: [facility] INVASIVE CV LAB;  Service: Cardiovascular;  Laterality: N/A;   CARDIOVERSION N/A 09/07/2018   Procedure: CARDIOVERSION;  Surgeon: Bergamo Gordy, MD;  Location: [facility] ENDOSCOPY;  Service: Cardiovascular;  Laterality: N/A;   CARDIOVERSION     COLONOSCOPY  08/16/2008   Dr. Shaaron; pancolonic diverticulosis, otherwise no abnormalities.  Consider repeat in 10 years.   COLONOSCOPY WITH PROPOFOL  N/A 11/20/2021   Procedure: COLONOSCOPY WITH PROPOFOL ;  Surgeon: Shaaron Lamar HERO, MD;  Location: [facility];  Service: Endoscopy;  Laterality: N/A;  8:15am, asa 3   CORONARY PRESSURE/FFR STUDY N/A 08/31/2017   Procedure: INTRAVASCULAR  PRESSURE WIRE/FFR STUDY;  Surgeon: Elmira Newman PARAS, MD;  Location: [facility] INVASIVE CV LAB;  Service: Cardiovascular;  Laterality: N/A;   EP IMPLANTABLE DEVICE N/A 04/30/2015   Procedure: Pacemaker Implant;  Surgeon: Danelle LELON Birmingham, MD;  Location: [facility] INVASIVE CV LAB;  Service: Cardiovascular;  Laterality: N/A;   LEFT AND RIGHT HEART CATHETERIZATION WITH CORONARY ANGIOGRAM N/A 09/30/2011   Procedure: LEFT AND RIGHT HEART CATHETERIZATION WITH CORONARY ANGIOGRAM;  Surgeon: Erick JONELLE Bergamo, MD;  Location: [facility] CATH LAB;  Service: Cardiovascular;   Laterality: N/A;   POLYPECTOMY  11/20/2021   Procedure: POLYPECTOMY;  Surgeon: Shaaron Lamar HERO, MD;  Location: [facility];  Service: Endoscopy;;   RIGHT/LEFT HEART CATH AND CORONARY ANGIOGRAPHY N/A 08/31/2017   Procedure: RIGHT/LEFT HEART CATH AND CORONARY ANGIOGRAPHY;  Surgeon: Elmira Newman PARAS, MD;  Location: [facility] INVAS[facility] CV LAB;  Service: Cardiovascular;  Laterality: N/A;   TOOTH EXTRACTION  10/07/2018   TUBAL LIGATION     VULVA MARYBETH BIOPSY N/A 07/26/2020   Procedure: VULVAR BIOPSY;  Surgeon: Viktoria Comer JONELLE, MD;  Location: [facility] ORS;  Service: Gynecology;  Laterality: N/A;   YAG LASER APPLICATION Right 03/07/2014   Procedure: YAG LASER APPLICATION;  Surgeon: Oneil T. Roz, MD;  Location: [facility] ORS;  Service: Ophthalmology;  Laterality: Right;  pt knows to arrive at 11:15   YAG LASER APPLICATION Left 03/21/2014   Procedure: YAG LASER APPLICATION;  Surgeon: Oneil Roz, MD;  Location: [facility] ORS;  Service: Ophthalmology;  Laterality: Left;    Social History:   reports that she has never smoked. She has never used smokeless tobacco. She reports that she does not drink alcohol and does not use drugs.  Allergies  Allergen Reactions   Hydrocodone  Other (See Comments)    Lethargic    Invokana [Canagliflozin] Palpitations and Other (See Comments)    Made heart race   Oxycodone Other (See Comments)    Lethargic     Family History  Problem Relation Age of Onset   Arthritis Mother    Diabetes Mother    Heart disease Mother    Hyperlipidemia Mother    Hypertension Mother    Arthritis Father    Asthma Father    Heart attack Father    Hyperlipidemia Father    Hypertension Father    Stroke Father    Arthritis Sister    Diabetes Sister    Hypertension Sister    Arthritis Sister    Diabetes Sister    Hyperlipidemia Sister    Hypertension Sister    Stroke Sister    Ovarian cancer Sister 82   Pancreatic cancer Sister 21   Uterine cancer Sister 65   Hyperlipidemia Sister     Hypertension Sister    Arthritis Brother    Heart attack Brother    Heart disease Brother    Hyperlipidemia Brother    Hypertension Brother    Alcohol abuse Brother    Arthritis Brother    Diabetes Brother    Early death Brother    Heart disease Brother    Hyperlipidemia Brother    Hypertension Brother    Cancer Maternal Aunt 39       unknown type   Throat cancer Maternal Grandfather        hx smoking/drinking   Hypertension Daughter    Cancer Daughter        female cancer cells   Kidney cancer Nephew        dx 73s   Colon cancer Neg Hx  Breast cancer Neg Hx    Prostate cancer Neg Hx      Prior to Admission medications   Medication Sig Start Date End Date Taking? Authorizing Provider  amiodarone  (PACERONE ) 200 MG tablet Take 1/2 (one-half) tablet by mouth once daily 05/14/23   Ladona Heinz, MD  apixaban  (ELIQUIS ) 2.5 MG TABS tablet Take 1 tablet (2.5 mg total) by mouth 2 (two) times daily. 04/14/23   Ladona Heinz, MD  Blood Glucose Monitoring Suppl (ACCU-CHEK AVIVA PLUS) w/Device KIT     [provider]  cholecalciferol  (VITAMIN D3) 25 MCG (1000 UNIT) tablet Take 2,000 Units by mouth in the morning.    [provider]  clobetasol  cream (TEMOVATE ) 0.05 % Apply 1 Application topically 2 (two) times daily as needed (rash).    [provider]  Continuous Glucose Sensor (DEXCOM G7 SENSOR) MISC  09/17/22   [provider]  dapagliflozin  propanediol (FARXIGA ) 10 MG TABS tablet Take 1 tablet (10 mg total) by mouth daily before breakfast. 03/18/23   Ladona Heinz, MD  dicyclomine  (BENTYL ) 20 MG tablet Take 1 tablet (20 mg total) by mouth 2 (two) times daily. 06/03/23   Shaaron Lamar HERO, MD  fenofibrate  160 MG tablet Take 160 mg by mouth daily with supper.     [provider]  insulin  aspart protamine- aspart (NOVOLOG  MIX 70/30) (70-30) 100 UNIT/ML injection Inject 0.1-0.2 mLs (10-20 Units total) into the skin See admin instructions. Inject 20 units  subcutaneously with breakfast & inject 10 units subcutaneously with supper (may adjust based on blood sugar readings) 10/09/22   Fairy Frames, MD  isosorbide  mononitrate (IMDUR ) 60 MG 24 hr tablet Take 1 tablet by mouth once daily 07/14/23   Ganji, Jay, MD  levothyroxine  (SYNTHROID ) 175 MCG tablet Take 175 mcg by mouth daily. 07/07/23   [provider]  lidocaine  (XYLOCAINE ) 5 % ointment Apply 1 Application topically 3 (three) times daily as needed. 02/27/23   Tucker, Katherine R, MD  metoprolol  tartrate (LOPRESSOR ) 50 MG tablet Take 1 tablet by mouth twice daily 03/09/23   Ganji, Jay, MD  nitroGLYCERIN  (NITROSTAT ) 0.4 MG SL tablet Place 1 tablet (0.4 mg total) under the tongue every 5 (five) minutes as needed for chest pain. 10/01/15   Claudene Pacific, MD  olmesartan  (BENICAR ) 40 MG tablet Take 40 mg by mouth daily. 10/27/22   [provider]  spironolactone  (ALDACTONE ) 25 MG tablet Take 1 tablet by mouth once daily 06/23/23   Ladona Heinz, MD  torsemide  (DEMADEX ) 5 MG tablet Take 5 mg by mouth daily. 06/02/23   [provider]    Physical Exam: Vitals:   07/21/23 2145 07/21/23 2200 07/21/23 2230 07/21/23 2242  BP: (!) 161/58  (!) 167/54   Pulse: 61 61 61 62  Resp: 20 16 13 18   Temp:  98.5 F (36.9 C)    TempSrc:  Rectal    SpO2: 91% 93% 93% 91%  Weight:      Height:        Constitutional: NAD, no pallor or diaphoresis   Eyes: PERTLA, lids and conjunctivae normal ENMT: Mucous membranes are moist. Posterior pharynx clear of any exudate or lesions.   Neck: supple, no masses  Respiratory: no wheezing, no crackles. No accessory muscle use.  Cardiovascular: S1 & S2 heard, regular rate and rhythm. No JVD. Abdomen: No tenderness, soft. Bowel sounds active.  Musculoskeletal: no clubbing / cyanosis. No joint deformity upper and lower extremities.   Skin: no significant rashes, lesions, ulcers. Warm,  dry, well-perfused. Neurologic: CN 2-12 grossly intact. Moving all  extremities. Alert and oriented.  Psychiatric: Calm. Cooperative.    Labs and Imaging on Admission: I have personally reviewed following labs and imaging studies  CBC: Recent Labs  Lab 07/21/23 2022  WBC 7.6  HGB 13.1  HCT 41.6  MCV 99.5  PLT 151   Basic Metabolic Panel: Recent Labs  Lab 07/21/23 2022  NA 137  K 4.2  CL 98  CO2 27  GLUCOSE 142*  BUN 33*  CREATININE 1.55*  CALCIUM  10.1   GFR: Estimated Creatinine Clearance: 28 mL/min (A) (by C-G formula based on SCr of 1.55 mg/dL (H)). Liver Function Tests: Recent Labs  Lab 07/21/23 2022  AST 20  ALT 16  ALKPHOS 33*  BILITOT 0.7  PROT 7.5  ALBUMIN 3.5   No results for input(s): LIPASE, AMYLASE in the last 168 hours. No results for input(s): AMMONIA in the last 168 hours. Coagulation Profile: Recent Labs  Lab 07/21/23 2022  INR 1.1   Cardiac Enzymes: No results for input(s): CKTOTAL, CKMB, CKMBINDEX, TROPONINI in the last 168 hours. BNP (last 3 results) No results for input(s): PROBNP in the last 8760 hours. HbA1C: No results for input(s): HGBA1C in the last 72 hours. CBG: No results for input(s): GLUCAP in the last 168 hours. Lipid Profile: No results for input(s): CHOL, HDL, LDLCALC, TRIG, CHOLHDL, LDLDIRECT in the last 72 hours. Thyroid  Function Tests: No results for input(s): TSH, T4TOTAL, FREET4, T3FREE, THYROIDAB in the last 72 hours. Anemia Panel: No results for input(s): VITAMINB12, FOLATE, FERRITIN, TIBC, IRON, RETICCTPCT in the last 72 hours. Urine analysis:    Component Value Date/Time   COLORURINE YELLOW 10/08/2016 1441   APPEARANCEUR Clear 12/24/2016 1500   LABSPEC <=1.005 (A) 10/08/2016 1441   PHURINE 6.0 10/08/2016 1441   GLUCOSEU Negative 12/24/2016 1500   GLUCOSEU NEGATIVE 10/08/2016 1441   HGBUR NEGATIVE 10/08/2016 1441   BILIRUBINUR Negative 12/24/2016 1500   KETONESUR NEGATIVE 10/08/2016 1441   PROTEINUR neg 12/29/2016  1347   PROTEINUR Negative 12/24/2016 1500   PROTEINUR NEGATIVE 04/29/2015 1930   UROBILINOGEN 0.2 11/25/2016 1431   UROBILINOGEN 0.2 10/08/2016 1441   NITRITE neg 12/29/2016 1347   NITRITE Negative 12/24/2016 1500   NITRITE NEGATIVE 10/08/2016 1441   LEUKOCYTESUR Negative 12/29/2016 1347   LEUKOCYTESUR Negative 12/24/2016 1500   Sepsis Labs: @LABRCNTIP (procalcitonin:4,lacticidven:4) )No results found for this or any previous visit (from the past 240 hours).   Radiological Exams on Admission: No results found.  EKG: Independently reviewed. Sinus or ectopic atrial rhythm, 1st degree AV block, LAFB.   Assessment/Plan   1. Lower GI bleeding  - Presents with painless hematochezia, had diverticulosis on prior endoscopy, and is hemodynamically stable with normal initial Hgb - Hold Eliquis , trend H&H, bowel rest, consult GI    2. CAD  - No anginal symptoms currently  - Continue beta-blocker and nitrates   3. PAF  - Hold Eliquis , continue metoprolol  as tolerated    4. Chronic HFpEF  - Appears compensated  - Continue diuretics    5. Hypothyroidism  - Synthroid     6. CKD 3B  - Appears close to baseline  - Renally-dose medications     DVT prophylaxis: SCDs  Code Status: Full  Level of Care: Level of care: Telemetry Family Communication: Son at bedside  Disposition Plan:  Patient is from: Home  Anticipated d/c is to: Home  Anticipated d/c date is: Possibly as early as 7/9 or 07/23/23  Patient currently:  Pending stable H&H  Consults called: Routine GI consultation requested via Epic order  Admission status: Observation     [name] Sprinkles, MD [facility]  07/21/2023, 10:54 PM

## 2023-07-21 NOTE — Progress Notes (Signed)
   07/21/23 2242  BiPAP/CPAP/SIPAP  Reason BIPAP/CPAP not in use Non-compliant   Patient states she hasn't been wearing one at home and hasn't for a while due to them sending the wrong equipment. Stated she did not want a hospital unit while here. One will be provided if she changes her mind.

## 2023-07-21 NOTE — ED Provider Notes (Signed)
 [place] EMERGENCY DEPARTMENT AT [facility] Provider Note   CSN: [id number] Arrival date & time: 07/21/23  8078     Patient presents with: Rectal Bleeding   [name] is a 78 y.o. female.    Rectal Bleeding Patient presents with rectal bleeding.  Started yesterday.  Red blood and potentially clots with it.  Has chronic diarrhea for 2 years however.  Mild crampy abdominal pain and states she just feels bad.  Is on Eliquis  for atrial fibrillation.    Past Medical History:  Diagnosis Date   Arthritis    CAD (coronary artery disease)    Diabetes mellitus without complication (HCC)    Diastolic heart failure (HCC)    Dyspnea    Encounter for care of pacemaker 09/03/2018   Family history of kidney cancer    Family history of throat cancer    Family history of uterine cancer    History of chickenpox    History of radiation therapy    Pelvis 10/23/20-12/14/20- Dr. Lynwood Nasuti   Hx of psoriatic arthritis    Hyperlipemia    Hypertension    Hypertension 05/03/2018   Hypothyroidism    Mitral valve disorders(424.0)    Myocardial infarction (HCC)    Obstructive sleep apnea    OSA (obstructive sleep apnea)    using BiPAP   Pacemaker    Paroxysmal atrial fibrillation (HCC) 10/11/2016   Peripheral neuropathy    Renal disorder Kidney disease stage2   Sick sinus syndrome [facility])    S/p dual-chamber pacemaker   Thyroid  disease hypothyroid   Urine incontinence    Vitamin D  deficiency    Vulvar cancer (HCC) 2022   s/p surgery and radiation therapy October-December 2022    Prior to Admission medications   Medication Sig Start Date End Date Taking? Authorizing Provider  amiodarone  (PACERONE ) 200 MG tablet Take 1/2 (one-half) tablet by mouth once daily 05/14/23   Ladona Heinz, MD  apixaban  (ELIQUIS ) 2.5 MG TABS tablet Take 1 tablet (2.5 mg total) by mouth 2 (two) times daily. 04/14/23   Ladona Heinz, MD  Blood Glucose Monitoring Suppl (ACCU-CHEK AVIVA PLUS) w/Device KIT      [provider]  cholecalciferol  (VITAMIN D3) 25 MCG (1000 UNIT) tablet Take 2,000 Units by mouth in the morning.    [provider]  clobetasol  cream (TEMOVATE ) 0.05 % Apply 1 Application topically 2 (two) times daily as needed (rash).    [provider]  Continuous Glucose Sensor (DEXCOM G7 SENSOR) MISC  09/17/22   [provider]  dapagliflozin  propanediol (FARXIGA ) 10 MG TABS tablet Take 1 tablet (10 mg total) by mouth daily before breakfast. 03/18/23   Ladona Heinz, MD  dicyclomine  (BENTYL ) 20 MG tablet Take 1 tablet (20 mg total) by mouth 2 (two) times daily. 06/03/23   Rourk, Lamar HERO, MD  fenofibrate  160 MG tablet Take 160 mg by mouth daily with supper.     [provider]  insulin  aspart protamine- aspart (NOVOLOG  MIX 70/30) (70-30) 100 UNIT/ML injection Inject 0.1-0.2 mLs (10-20 Units total) into the skin See admin instructions. Inject 20 units subcutaneously with breakfast & inject 10 units subcutaneously with supper (may adjust based on blood sugar readings) 10/09/22   Fairy Frames, MD  isosorbide  mononitrate (IMDUR ) 60 MG 24 hr tablet Take 1 tablet by mouth once daily 07/14/23   Ganji, Jay, MD  levothyroxine  (SYNTHROID ) 175 MCG tablet Take 175 mcg by mouth daily. 07/07/23   [provider]  lidocaine  (XYLOCAINE ) 5 % ointment Apply 1 Application topically 3 (three) times daily as needed. 02/27/23   Tucker, Katherine R, MD  metoprolol  tartrate (LOPRESSOR ) 50 MG tablet Take 1 tablet by mouth twice daily 03/09/23   Ganji, Jay, MD  nitroGLYCERIN  (NITROSTAT ) 0.4 MG SL tablet Place 1 tablet (0.4 mg total) under the tongue every 5 (five) minutes as needed for chest pain. 10/01/15   Claudene Pacific, MD  olmesartan  (BENICAR ) 40 MG tablet Take 40 mg by mouth daily. 10/27/22   [provider]  spironolactone  (ALDACTONE ) 25 MG tablet Take 1 tablet by mouth once daily 06/23/23   Ganji, Jay, MD  torsemide  (DEMADEX ) 5 MG tablet Take 5 mg by mouth daily.  06/02/23   [provider]    Allergies: Hydrocodone , Invokana [canagliflozin], and Oxycodone    Review of Systems  Gastrointestinal:  Positive for hematochezia.    Updated Vital Signs BP (!) 171/64   Pulse 61   Temp 98.7 F (37.1 C) (Oral)   Resp 17   Ht 5' 2 (1.575 m)   Wt 78.2 kg   SpO2 93%   BMI 31.53 kg/m   Physical Exam Vitals and nursing note reviewed.  Cardiovascular:     Rate and Rhythm: Regular rhythm.  Abdominal:     Comments: Minimal abdominal tenderness.  No rebound or guarding.  Musculoskeletal:        General: No tenderness.  Skin:    Coloration: Skin is not pale.  Neurological:     Mental Status: She is alert.     (all labs ordered are listed, but only abnormal results are displayed) Labs Reviewed  COMPREHENSIVE METABOLIC PANEL WITH GFR - Abnormal; Notable for the following components:      Result Value   Glucose, Bld 142 (*)    BUN 33 (*)    Creatinine, Ser 1.55 (*)    Alkaline Phosphatase 33 (*)    GFR, Estimated 34 (*)    All other components within normal limits  CBC  PROTIME-INR  POC OCCULT BLOOD, ED  TYPE AND SCREEN    EKG: None  Radiology: No results found.   Procedures   Medications Ordered in the ED - No data to display                                  Medical Decision Making Amount and/or Complexity of Data Reviewed Labs: ordered.  Risk Decision regarding hospitalization.  Patient with GI bleed.  History of chronic diarrhea.  Is on anticoagulation.  States she feels lightheaded.  Reviewed notes and has a history of diverticulosis.  I think most likely diverticular bleed.  Hemoglobin reassuring initially.  However with anticoagulation and car videos I feel she would benefit from admission to the hospital.  Has seen Dr. Shaaron in the past.  Will discuss with hospitalist      Final diagnoses:  Acute GI bleeding    ED Discharge Orders     None          Patsey Lot, MD 07/21/23 2149

## 2023-07-21 NOTE — ED Triage Notes (Signed)
 Pt arrives POV c/o rectal bleeding with bowel movements since yesterday. Pts family states she has been having diarrhea for almost 2 years now. Pt currently taking Elquis for Afib.

## 2023-07-22 ENCOUNTER — Telehealth: Payer: Self-pay | Admitting: Gastroenterology

## 2023-07-22 DIAGNOSIS — Z860101 Personal history of adenomatous and serrated colon polyps: Secondary | ICD-10-CM | POA: Diagnosis not present

## 2023-07-22 DIAGNOSIS — Z8719 Personal history of other diseases of the digestive system: Secondary | ICD-10-CM | POA: Diagnosis not present

## 2023-07-22 DIAGNOSIS — K625 Hemorrhage of anus and rectum: Secondary | ICD-10-CM | POA: Diagnosis not present

## 2023-07-22 DIAGNOSIS — K922 Gastrointestinal hemorrhage, unspecified: Secondary | ICD-10-CM | POA: Diagnosis not present

## 2023-07-22 LAB — CBC
HCT: 38.2 % (ref 36.0–46.0)
HCT: 39.2 % (ref 36.0–46.0)
HCT: 35.8 % — ABNORMAL LOW (ref 36.0–46.0)
Hemoglobin: 12.2 g/dL (ref 12.0–15.0)
Hemoglobin: 12.6 g/dL (ref 12.0–15.0)
Hemoglobin: 11.4 g/dL — ABNORMAL LOW (ref 12.0–15.0)
MCH: 32 pg (ref 26.0–34.0)
MCH: 32.6 pg (ref 26.0–34.0)
MCH: 32 pg (ref 26.0–34.0)
MCHC: 31.9 g/dL (ref 30.0–36.0)
MCHC: 32.1 g/dL (ref 30.0–36.0)
MCHC: 31.8 g/dL (ref 30.0–36.0)
MCV: 100.3 fL — ABNORMAL HIGH (ref 80.0–100.0)
MCV: 101.3 fL — ABNORMAL HIGH (ref 80.0–100.0)
MCV: 100.6 fL — ABNORMAL HIGH (ref 80.0–100.0)
Platelets: 142 K/uL — ABNORMAL LOW (ref 150–400)
Platelets: 150 K/uL (ref 150–400)
Platelets: 163 K/uL (ref 150–400)
RBC: 3.81 MIL/uL — ABNORMAL LOW (ref 3.87–5.11)
RBC: 3.87 MIL/uL (ref 3.87–5.11)
RBC: 3.56 MIL/uL — ABNORMAL LOW (ref 3.87–5.11)
RDW: 14.7 % (ref 11.5–15.5)
RDW: 14.8 % (ref 11.5–15.5)
RDW: 14.6 % (ref 11.5–15.5)
WBC: 6.8 K/uL (ref 4.0–10.5)
WBC: 7.3 K/uL (ref 4.0–10.5)
WBC: 6.4 K/uL (ref 4.0–10.5)
nRBC: 0 % (ref 0.0–0.2)
nRBC: 0 % (ref 0.0–0.2)
nRBC: 0 % (ref 0.0–0.2)

## 2023-07-22 LAB — CBG MONITORING, ED
Glucose-Capillary: 104 mg/dL — ABNORMAL HIGH (ref 70–99)
Glucose-Capillary: 114 mg/dL — ABNORMAL HIGH (ref 70–99)
Glucose-Capillary: 164 mg/dL — ABNORMAL HIGH (ref 70–99)

## 2023-07-22 LAB — GLUCOSE, CAPILLARY
Glucose-Capillary: 174 mg/dL — ABNORMAL HIGH (ref 70–99)
Glucose-Capillary: 189 mg/dL — ABNORMAL HIGH (ref 70–99)
Glucose-Capillary: 226 mg/dL — ABNORMAL HIGH (ref 70–99)

## 2023-07-22 LAB — MRSA NEXT GEN BY PCR, NASAL: MRSA by PCR Next Gen: NOT DETECTED

## 2023-07-22 LAB — BASIC METABOLIC PANEL WITH GFR
Anion gap: 9 (ref 5–15)
BUN: 30 mg/dL — ABNORMAL HIGH (ref 8–23)
CO2: 29 mmol/L (ref 22–32)
Calcium: 9.8 mg/dL (ref 8.9–10.3)
Chloride: 101 mmol/L (ref 98–111)
Creatinine, Ser: 1.47 mg/dL — ABNORMAL HIGH (ref 0.44–1.00)
GFR, Estimated: 36 mL/min — ABNORMAL LOW (ref >60)
Glucose, Bld: 103 mg/dL — ABNORMAL HIGH (ref 70–99)
Potassium: 4.9 mmol/L (ref 3.5–5.1)
Sodium: 139 mmol/L (ref 135–145)

## 2023-07-22 MED ORDER — CHLORHEXIDINE GLUCONATE CLOTH 2 % EX PADS
6.0000 | MEDICATED_PAD | Freq: Every day | CUTANEOUS | Status: DC
  Administered 2023-07-22 – 2023-07-23 (×2): 6 via TOPICAL

## 2023-07-22 MED ORDER — BOOST / RESOURCE BREEZE PO LIQD CUSTOM
1.0000 | Freq: Three times a day (TID) | ORAL | Status: DC
  Administered 2023-07-22 – 2023-07-24 (×5): 1 via ORAL

## 2023-07-22 NOTE — ED Notes (Signed)
 Pt ambulated to restroom.

## 2023-07-22 NOTE — Care Management Obs Status (Signed)
 MEDICARE OBSERVATION STATUS NOTIFICATION   Patient Details  Name: [name] MRN: [id number] Date of Birth: [birth year]   Medicare Observation Status Notification Given:  Yes    Hoy DELENA Bigness, LCSW 07/22/2023, 1:23 PM

## 2023-07-22 NOTE — Progress Notes (Signed)
 PROGRESS NOTE    [name]  FMW:[id number] DOB: 12-22-1945 DOA: 07/21/2023 PCP: Clarice Nottingham, MD    Brief Narrative:  [name] is an 78 y.o. female with medical history significant for hypertension, type 2 diabetes mellitus, sick sinus syndrome with pacer, hypothyroidism, chronic HFpEF, PAF on Eliquis , and chronic diarrhea who presents with rectal bleeding.   Patient reports that yesterday afternoon, she felt as though she was going to have diarrhea but passed mainly just a large volume of red blood.  She had another episode this afternoon that was very similar and she also saw some clots.  She had 2 more episodes while making her way to the ED.   On colonoscopy in November 2023, diverticulosis was noted throughout the colon.   Assessment and Plan: Lower GI bleeding  - Presents with painless hematochezia, had diverticulosis on prior endoscopy, and is hemodynamically stable with normal initial Hgb - Hold Eliquis , trend H&H, bowel rest, consult GI     CAD  - No anginal symptoms currently  - Continue beta-blocker and nitrates    PAF  - Hold Eliquis , continue metoprolol  as tolerated     Chronic HFpEF  - Appears compensated  - Continue diuretics     Hypothyroidism  - Synthroid       CKD 3B  - Appears close to baseline  - Renally-dose medications     DVT prophylaxis: SCDs Start: 07/21/23 2213    Code Status: Full Code   Disposition Plan:  Level of care: Telemetry Status is: Observation     Consultants:  GI   Subjective: Asking about going home, thinks the bleeding has slowed  Objective: Vitals:   07/22/23 0815 07/22/23 0900 07/22/23 1000 07/22/23 1200  BP:  (!) 147/56 (!) 138/48 (!) 132/101  Pulse: 66 60 70 66  Resp: (!) 23 19 18 20   Temp:    98.3 F (36.8 C)  TempSrc:    Oral  SpO2: 95% 91% 94% 93%  Weight:      Height:       No intake or output data in the 24 hours ending 07/22/23 1217 Filed Weights   07/21/23 1953  Weight: 78.2 kg     Examination:   General: Appearance:    Obese female in no acute distress     Lungs:     respirations unlabored  Heart:    Normal heart rate.     MS:   All extremities are intact.    Neurologic:   Awake, alert, oriented x 3. No apparent focal neurological           defect.        Data Reviewed: I have personally reviewed following labs and imaging studies  CBC: Recent Labs  Lab 07/21/23 2022 07/22/23 0504 07/22/23 1010  WBC 7.6 6.8 7.3  HGB 13.1 12.2 12.6  HCT 41.6 38.2 39.2  MCV 99.5 100.3* 101.3*  PLT 151 142* 150   Basic Metabolic Panel: Recent Labs  Lab 07/21/23 2022 07/22/23 0504  NA 137 139  K 4.2 4.9  CL 98 101  CO2 27 29  GLUCOSE 142* 103*  BUN 33* 30*  CREATININE 1.55* 1.47*  CALCIUM  10.1 9.8   GFR: Estimated Creatinine Clearance: 29.5 mL/min (A) (by C-G formula based on SCr of 1.47 mg/dL (H)). Liver Function Tests: Recent Labs  Lab 07/21/23 2022  AST 20  ALT 16  ALKPHOS 33*  BILITOT 0.7  PROT 7.5  ALBUMIN 3.5   No results for  input(s): LIPASE, AMYLASE in the last 168 hours. No results for input(s): AMMONIA in the last 168 hours. Coagulation Profile: Recent Labs  Lab 07/21/23 2022  INR 1.1   Cardiac Enzymes: No results for input(s): CKTOTAL, CKMB, CKMBINDEX, TROPONINI in the last 168 hours. BNP (last 3 results) No results for input(s): PROBNP in the last 8760 hours. HbA1C: No results for input(s): HGBA1C in the last 72 hours. CBG: Recent Labs  Lab 07/21/23 2328 07/22/23 0345 07/22/23 0756 07/22/23 1147  GLUCAP 121* 104* 114* 164*   Lipid Profile: No results for input(s): CHOL, HDL, LDLCALC, TRIG, CHOLHDL, LDLDIRECT in the last 72 hours. Thyroid  Function Tests: No results for input(s): TSH, T4TOTAL, FREET4, T3FREE, THYROIDAB in the last 72 hours. Anemia Panel: No results for input(s): VITAMINB12, FOLATE, FERRITIN, TIBC, IRON, RETICCTPCT in the last 72 hours. Sepsis  Labs: No results for input(s): PROCALCITON, LATICACIDVEN in the last 168 hours.  No results found for this or any previous visit (from the past 240 hours).       Radiology Studies: No results found.      Scheduled Meds:  amiodarone   100 mg Oral Daily   insulin  aspart  0-6 Units Subcutaneous Q4H   irbesartan   300 mg Oral Daily   isosorbide  mononitrate  60 mg Oral Daily   levothyroxine   175 mcg Oral Q0600   metoprolol  tartrate  50 mg Oral BID   sodium chloride  flush  3 mL Intravenous Q12H   spironolactone   25 mg Oral Daily   torsemide   5 mg Oral Daily   Continuous Infusions:   LOS: 0 days    Time spent: 45 minutes spent on chart review, discussion with nursing staff, consultants, updating family and interview/physical exam; more than 50% of that time was spent in counseling and/or coordination of care.    Harlene RAYMOND Bowl, DO [facility] Available via Epic secure chat 7am-7pm After these hours, please refer to coverage provider listed on [email or web address] 07/22/2023, 12:17 PM

## 2023-07-22 NOTE — Telephone Encounter (Signed)
 Patient called [name] after hours 7/8 left voicemail to schedule her an appointment. Called patient back to get her scheduled and realized patient is in the hospital. Went into hospital at 7/8 around 8pm.

## 2023-07-22 NOTE — Progress Notes (Signed)
   07/22/23 1323  TOC Brief Assessment  Insurance and Status Reviewed  Patient has primary care physician Yes  Home environment has been reviewed Single family home  Prior level of function: Independent/modified independent  Prior/Current Home Services No current home services  Social Drivers of Health Review SDOH reviewed no interventions necessary  Readmission risk has been reviewed Yes  Transition of care needs no transition of care needs at this time

## 2023-07-22 NOTE — Progress Notes (Addendum)
Patient refusing BIPAP.

## 2023-07-22 NOTE — Consult Note (Signed)
 Gastroenterology Consult   Referring Provider: No ref. provider found Primary Care Physician:  Clarice Nottingham, MD Primary Gastroenterologist:  Ozell Hollingshead, MD Patient ID: [name]; [id number]; January 03, 1946  Admit date: 07/21/2023  LOS: 0 days   Date of Consultation: 07/22/2023  Reason for Consultation:  painless rectal bleeding on Eliquis     History of Present Illness   [name] is a 78 y.o. female with nonobstructive CAD, diastolic heart failure, sick sinus syndrome s/p pacemaker, HTN, DM, hypothyroidism, sleep apnea, Afib on Eliquis , CKD, h/o vulvar cancer s/p surgery/radiation in 2022, with fecal leakage/urgency/tenesmus since radiation, presenting to the ED with several episodes of painless hematochezia.   In ED: oxygen  in low 90s on RA, with normal HR, stable BP.  BUN 33, Cre 1.55 similar to 10/2022. Glu 142. LFTs normal. Hgb 13.1 (11.8 in 09/2022). Plt 151. INR 1.1.  This morning her BUN 30, Cre 1.47. Hgb 12.2, Plt 142.  GI consult: recently seen in the office 07/14/23 for follow up of chronic diarrhea. When I saw her she was doing very well. She noted her chronic bowels issues were much improved since starting dicyclomine . She had inadvertently stopped her cholestyramine  when dicyclomine  was added but was doing well having one BM daily with more control of her stools then previously. Opted to stay of cholestyramine  at that time since she ws doing well.   She reports on Monday evening, she felt like she was going to have diarrhea but passed mostly large volume of red blood. The next day she had another episode with similar amount of bleeding. Started having some vague lower abdominal discomfort. She presented to the ED yesterday evening, having two episodes of passing mostly blood with clots while in the waiting room. She has not seen any black stools. She states the amount of blood passed is about the same, does not seem to be tapering off. Her last episode of passing blood  was this morning (first episode since seen in the ED) but she did not show her nurse. No nausea. Really no change in her weakness, feeling off balanced. Denies presyncopal symptoms. No UGI symptoms.  Her last dose of Eliquis  was 07/21/23 at 1030am.   Colonoscopy 11/2021: - Diverticulosis in the entire examined colon. - Six 2 to 6 mm polyps in the cecum. Hot and cold snare removed. Polyp ablation performed. - The examination was otherwise normal on direct and retroflexion views. - No specimens collected. -Rectal and random colon biopsies negative.   -3 tubular adenomas removed. -Consider colonoscopy in 3 years if overall health permits   Prior to Admission medications   Medication Sig Start Date End Date Taking? Authorizing Provider  amiodarone  (PACERONE ) 200 MG tablet Take 1/2 (one-half) tablet by mouth once daily 05/14/23  Yes Ladona Heinz, MD  apixaban  (ELIQUIS ) 2.5 MG TABS tablet Take 1 tablet (2.5 mg total) by mouth 2 (two) times daily. 04/14/23  Yes Ladona Heinz, MD  cholecalciferol  (VITAMIN D3) 25 MCG (1000 UNIT) tablet Take 2,000 Units by mouth in the morning.   Yes [provider]  Continuous Glucose Sensor (DEXCOM G7 SENSOR) MISC  09/17/22  Yes [provider]  dapagliflozin  propanediol (FARXIGA ) 10 MG TABS tablet Take 1 tablet (10 mg total) by mouth daily before breakfast. 03/18/23  Yes Ladona Heinz, MD  dicyclomine  (BENTYL ) 20 MG tablet Take 1 tablet (20 mg total) by mouth 2 (two) times daily. 06/03/23  Yes Hollingshead Lamar HERO, MD  fenofibrate  160 MG tablet Take 160  mg by mouth daily with supper.    Yes [provider]  Ibuprofen -diphenhydrAMINE Cit (IBUPROFEN  PM PO) Take 1 tablet by mouth at bedtime.   Yes [provider]  insulin  aspart protamine- aspart (NOVOLOG  MIX 70/30) (70-30) 100 UNIT/ML injection Inject 0.1-0.2 mLs (10-20 Units total) into the skin See admin instructions. Inject 20 units subcutaneously with breakfast & inject 10 units subcutaneously with  supper (may adjust based on blood sugar readings) Patient taking differently: Inject 10-25 Units into the skin See admin instructions. Inject 25 units subcutaneously with breakfast & inject 10 units subcutaneously with supper (may adjust based on blood sugar readings) 10/09/22  Yes Fairy Frames, MD  isosorbide  mononitrate (IMDUR ) 60 MG 24 hr tablet Take 1 tablet by mouth once daily 07/14/23  Yes Ladona Heinz, MD  levothyroxine  (SYNTHROID ) 175 MCG tablet Take 175 mcg by mouth daily. 07/07/23  Yes [provider]  lidocaine  (XYLOCAINE ) 5 % ointment Apply 1 Application topically 3 (three) times daily as needed. 02/27/23  Yes Viktoria Comer SAUNDERS, MD  metoprolol  tartrate (LOPRESSOR ) 50 MG tablet Take 1 tablet by mouth twice daily 03/09/23  Yes Ladona Heinz, MD  nitroGLYCERIN  (NITROSTAT ) 0.4 MG SL tablet Place 1 tablet (0.4 mg total) under the tongue every 5 (five) minutes as needed for chest pain. 10/01/15  Yes Claudene Pacific, MD  olmesartan  (BENICAR ) 40 MG tablet Take 40 mg by mouth daily. 10/27/22  Yes [provider]  spironolactone  (ALDACTONE ) 25 MG tablet Take 1 tablet by mouth once daily 06/23/23  Yes Ladona Heinz, MD  torsemide  (DEMADEX ) 5 MG tablet Take 5 mg by mouth daily. Take 10 mg on day 1 alternate with 20 mg daily 06/02/23  Yes [provider]  Blood Glucose Monitoring Suppl (ACCU-CHEK AVIVA PLUS) w/Device KIT     [provider]    Current Facility-Administered Medications  Medication Dose Route Frequency Provider Last Rate Last Admin   acetaminophen  (TYLENOL ) tablet 650 mg  650 mg Oral Q6H PRN Opyd, Timothy S, MD       Or   acetaminophen  (TYLENOL ) suppository 650 mg  650 mg Rectal Q6H PRN Opyd, Timothy S, MD       amiodarone  (PACERONE ) tablet 100 mg  100 mg Oral Daily Opyd, Timothy S, MD       insulin  aspart (novoLOG ) injection 0-6 Units  0-6 Units Subcutaneous Q4H Opyd, Timothy S, MD       irbesartan  (AVAPRO ) tablet 300 mg  300 mg Oral Daily Opyd, Timothy S, MD        isosorbide  mononitrate (IMDUR ) 24 hr tablet 60 mg  60 mg Oral Daily Opyd, Timothy S, MD       levothyroxine  (SYNTHROID ) tablet 175 mcg  175 mcg Oral Q0600 Opyd, Timothy S, MD   175 mcg at 07/22/23 0549   metoprolol  tartrate (LOPRESSOR ) tablet 50 mg  50 mg Oral BID Opyd, Timothy S, MD   50 mg at 07/21/23 2330   ondansetron  (ZOFRAN ) tablet 4 mg  4 mg Oral Q6H PRN Opyd, Timothy S, MD       Or   ondansetron  (ZOFRAN ) injection 4 mg  4 mg Intravenous Q6H PRN Opyd, Timothy S, MD       sodium chloride  flush (NS) 0.9 % injection 3 mL  3 mL Intravenous Q12H Opyd, Timothy S, MD       spironolactone  (ALDACTONE ) tablet 25 mg  25 mg Oral Daily Opyd, Timothy S, MD       torsemide  (DEMADEX ) tablet 5  mg  5 mg Oral Daily Opyd, Timothy S, MD       Current Outpatient Medications  Medication Sig Dispense Refill   amiodarone  (PACERONE ) 200 MG tablet Take 1/2 (one-half) tablet by mouth once daily 45 tablet 0   apixaban  (ELIQUIS ) 2.5 MG TABS tablet Take 1 tablet (2.5 mg total) by mouth 2 (two) times daily. 60 tablet 5   cholecalciferol  (VITAMIN D3) 25 MCG (1000 UNIT) tablet Take 2,000 Units by mouth in the morning.     Continuous Glucose Sensor (DEXCOM G7 SENSOR) MISC      dapagliflozin  propanediol (FARXIGA ) 10 MG TABS tablet Take 1 tablet (10 mg total) by mouth daily before breakfast. 90 tablet 3   dicyclomine  (BENTYL ) 20 MG tablet Take 1 tablet (20 mg total) by mouth 2 (two) times daily. 60 tablet 3   fenofibrate  160 MG tablet Take 160 mg by mouth daily with supper.      Ibuprofen -diphenhydrAMINE Cit (IBUPROFEN  PM PO) Take 1 tablet by mouth at bedtime.     insulin  aspart protamine- aspart (NOVOLOG  MIX 70/30) (70-30) 100 UNIT/ML injection Inject 0.1-0.2 mLs (10-20 Units total) into the skin See admin instructions. Inject 20 units subcutaneously with breakfast & inject 10 units subcutaneously with supper (may adjust based on blood sugar readings) (Patient taking differently: Inject 10-25 Units into the skin See  admin instructions. Inject 25 units subcutaneously with breakfast & inject 10 units subcutaneously with supper (may adjust based on blood sugar readings))     isosorbide  mononitrate (IMDUR ) 60 MG 24 hr tablet Take 1 tablet by mouth once daily 90 tablet 3   levothyroxine  (SYNTHROID ) 175 MCG tablet Take 175 mcg by mouth daily.     lidocaine  (XYLOCAINE ) 5 % ointment Apply 1 Application topically 3 (three) times daily as needed. 35.44 g 0   metoprolol  tartrate (LOPRESSOR ) 50 MG tablet Take 1 tablet by mouth twice daily 180 tablet 1   nitroGLYCERIN  (NITROSTAT ) 0.4 MG SL tablet Place 1 tablet (0.4 mg total) under the tongue every 5 (five) minutes as needed for chest pain. 25 tablet 1   olmesartan  (BENICAR ) 40 MG tablet Take 40 mg by mouth daily.     spironolactone  (ALDACTONE ) 25 MG tablet Take 1 tablet by mouth once daily 90 tablet 3   torsemide  (DEMADEX ) 5 MG tablet Take 5 mg by mouth daily. Take 10 mg on day 1 alternate with 20 mg daily     Blood Glucose Monitoring Suppl (ACCU-CHEK AVIVA PLUS) w/Device KIT       Allergies as of 07/21/2023 - Review Complete 07/21/2023  Allergen Reaction Noted   Hydrocodone  Other (See Comments) 04/29/2015   Invokana [canagliflozin] Palpitations and Other (See Comments) 04/29/2015   Oxycodone Other (See Comments) 04/29/2015    Past Medical History:  Diagnosis Date   Arthritis    CAD (coronary artery disease)    Diabetes mellitus without complication (HCC)    Diastolic heart failure (HCC)    Dyspnea    Encounter for care of pacemaker 09/03/2018   Family history of kidney cancer    Family history of throat cancer    Family history of uterine cancer    History of chickenpox    History of radiation therapy    Pelvis 10/23/20-12/14/20- Dr. Lynwood Nasuti   Hx of psoriatic arthritis    Hyperlipemia    Hypertension    Hypertension 05/03/2018   Hypothyroidism    Mitral valve disorders(424.0)    Myocardial infarction (HCC)    Obstructive sleep apnea  OSA  (obstructive sleep apnea)    using BiPAP   Pacemaker    Paroxysmal atrial fibrillation (HCC) 10/11/2016   Peripheral neuropathy    Renal disorder Kidney disease stage2   Sick sinus syndrome [facility])    S/p dual-chamber pacemaker   Thyroid  disease hypothyroid   Urine incontinence    Vitamin D  deficiency    Vulvar cancer (HCC) 2022   s/p surgery and radiation therapy October-December 2022    Past Surgical History:  Procedure Laterality Date   BACK SURGERY     BACK SURGERY     BIOPSY  11/20/2021   Procedure: BIOPSY;  Surgeon: Shaaron Lamar HERO, MD;  Location: [facility];  Service: Endoscopy;;   CARDIAC CATHETERIZATION N/A 04/29/2015   Procedure: Temporary Pacemaker;  Surgeon: Gordy Bergamo, MD;  Location: [facility] INVASIVE CV LAB;  Service: Cardiovascular;  Laterality: N/A;   CARDIOVERSION N/A 09/07/2018   Procedure: CARDIOVERSION;  Surgeon: Bergamo Gordy, MD;  Location[facility] ENDOSCOPY;  Service: Cardiovascular;  Laterality: N/A;   CARDIOVERSION     COLONOSCOPY  08/16/2008   Dr. Shaaron; pancolonic diverticulosis, otherwise no abnormalities.  Consider repeat in 10 years.   COLONOSCOPY WITH PROPOFOL  N/A 11/20/2021   Procedure: COLONOSCOPY WITH PROPOFOL ;  Surgeon: Shaaron Lamar HERO, MD;  Location: [facility];  Service: Endoscopy;  Laterality: N/A;  8:15am, asa 3   CORONARY PRESSURE/FFR STUDY N/A 08/31/2017   Procedure: INTRAVASCULAR PRESSURE WIRE/FFR STUDY;  Surgeon: Elmira Newman PARAS, MD;  Location: [facility] INVASIVE CV LAB;  Service: Cardiovascular;  Laterality: N/A;   EP IMPLANTABLE DEVICE N/A 04/30/2015   Procedure: Pacemaker Implant;  Surgeon: Danelle LELON Birmingham, MD;  Location: [facility] INVASIVE CV LAB;  Service: Cardiovascular;  Laterality: N/A;   LEFT AND RIGHT HEART CATHETERIZATION WITH CORONARY ANGIOGRAM N/A 09/30/2011   Procedure: LEFT AND RIGHT HEART CATHETERIZATION WITH CORONARY ANGIOGRAM;  Surgeon: Erick JONELLE Bergamo, MD;  Location: [facility] CATH LAB;  Service: Cardiovascular;  Laterality: N/A;   POLYPECTOMY   11/20/2021   Procedure: POLYPECTOMY;  Surgeon: Shaaron Lamar HERO, MD;  Location: [facility];  Service: Endoscopy;;   RIGHT/LEFT HEART CATH AND CORONARY ANGIOGRAPHY N/A 08/31/2017   Procedure: RIGHT/LEFT HEART CATH AND CORONARY ANGIOGRAPHY;  Surgeon: Elmira Newman PARAS, MD;  Location: [facility] INV[facility] CV LAB;  Service: Cardiovascular;  Laterality: N/A;   TOOTH EXTRACTION  10/07/2018   TUBAL LIGATION     VULVA MARYBETH BIOPSY N/A 07/26/2020   Procedure: VULVAR BIOPSY;  Surgeon: Viktoria Comer JONELLE, MD;  Location: [facility] ORS;  Service: Gynecology;  Laterality: N/A;   YAG LASER APPLICATION Right 03/07/2014   Procedure: YAG LASER APPLICATION;  Surgeon: Oneil T. Roz, MD;  Location: [facility] ORS;  Service: Ophthalmology;  Laterality: Right;  pt knows to arrive at 11:15   YAG LASER APPLICATION Left 03/21/2014   Procedure: YAG LASER APPLICATION;  Surgeon: Oneil Roz, MD;  Location: [facility] ORS;  Service: Ophthalmology;  Laterality: Left;    Family History  Problem Relation Age of Onset   Arthritis Mother    Diabetes Mother    Heart disease Mother    Hyperlipidemia Mother    Hypertension Mother    Arthritis Father    Asthma Father    Heart attack Father    Hyperlipidemia Father    Hypertension Father    Stroke Father    Arthritis Sister    Diabetes Sister    Hypertension Sister    Arthritis Sister    Diabetes Sister    Hyperlipidemia Sister    Hypertension  Sister    Stroke Sister    Ovarian cancer Sister 7   Pancreatic cancer Sister 41   Uterine cancer Sister 66   Hyperlipidemia Sister    Hypertension Sister    Arthritis Brother    Heart attack Brother    Heart disease Brother    Hyperlipidemia Brother    Hypertension Brother    Alcohol abuse Brother    Arthritis Brother    Diabetes Brother    Early death Brother    Heart disease Brother    Hyperlipidemia Brother    Hypertension Brother    Cancer Maternal Aunt 71       unknown type   Throat cancer Maternal Grandfather        hx  smoking/drinking   Hypertension Daughter    Cancer Daughter        female cancer cells   Kidney cancer Nephew        dx 40s   Colon cancer Neg Hx    Breast cancer Neg Hx    Prostate cancer Neg Hx     Social History   Socioeconomic History   Marital status: Widowed    Spouse name: Not on file   Number of children: 3   Years of education: Not on file   Highest education level: Not on file  Occupational History   Occupation: Retired  Tobacco Use   Smoking status: Never   Smokeless tobacco: Never  Vaping Use   Vaping status: Never Used  Substance and Sexual Activity   Alcohol use: No    Alcohol/week: 0.0 standard drinks of alcohol   Drug use: No   Sexual activity: Not Currently    Birth control/protection: Post-menopausal  Other Topics Concern   Not on file  Social History Narrative   Husband recently passed away on 05-12-20.   Social Drivers of Corporate investment banker Strain: Low Risk  (10/06/2022)   Overall Financial Resource Strain (CARDIA)    Difficulty of Paying Living Expenses: Not hard at all  Food Insecurity: No Food Insecurity (10/04/2022)   Hunger Vital Sign    Worried About Running Out of Food in the Last Year: Never true    Ran Out of Food in the Last Year: Never true  Transportation Needs: No Transportation Needs (10/06/2022)   PRAPARE - Administrator, Civil Service (Medical): No    Lack of Transportation (Non-Medical): No  Physical Activity: Not on file  Stress: Not on file  Social Connections: Not on file  Intimate Partner Violence: Not At Risk (10/04/2022)   Humiliation, Afraid, Rape, and Kick questionnaire    Fear of Current or Ex-Partner: No    Emotionally Abused: No    Physically Abused: No    Sexually Abused: No     Review of System:   General: Negative for anorexia, weight loss, fever, chills, fatigue, +weakness. Eyes: Negative for vision changes.  ENT: Negative for hoarseness, difficulty swallowing , nasal  congestion. CV: Negative for chest pain, angina, palpitations, dyspnea on exertion, peripheral edema.  Respiratory: Negative for dyspnea at rest, dyspnea on exertion, cough, sputum, wheezing.  GI: See history of present illness. GU:  Negative for dysuria, hematuria, urinary incontinence, urinary frequency, nocturnal urination.  MS: Negative for joint pain, low back pain.  Derm: Negative for rash or itching.  Neuro: Negative for weakness, abnormal sensation, seizure, frequent headaches, memory loss, confusion.  Psych: Negative for anxiety, depression, suicidal ideation, hallucinations.  Endo: Negative for unusual  weight change.  Heme: Negative for bruising or bleeding. Allergy: Negative for rash or hives.      Physical Examination:   Vital signs in last 24 hours: Temp:  [97.6 F (36.4 C)-98.7 F (37.1 C)] 98.2 F (36.8 C) (07/09 0729) Pulse Rate:  [60-79] 66 (07/09 0815) Resp:  [13-23] 23 (07/09 0815) BP: (104-171)/(41-80) 137/57 (07/09 0800) SpO2:  [87 %-97 %] 95 % (07/09 0815) Weight:  [78.2 kg] 78.2 kg (07/08 1953)    General: Well-nourished, well-developed in no acute distress.  Head: Normocephalic, atraumatic.   Eyes: Conjunctiva pink, no icterus. Mouth: Oropharyngeal mucosa moist and pink   Neck: Supple without thyromegaly, masses, or lymphadenopathy.  Lungs: Clear to auscultation bilaterally.  Heart: Regular rate and rhythm, no murmurs rubs or gallops.  Abdomen: Bowel sounds are normal, nontender, nondistended, no hepatosplenomegaly or masses, no abdominal bruits or hernia , no rebound or guarding.   Rectal: not performed Extremities: No lower extremity edema, clubbing, deformity.  Neuro: Alert and oriented x 4 , grossly normal neurologically.  Skin: Warm and dry, no rash or jaundice.   Psych: Alert and cooperative, normal mood and affect.        Intake/Output from previous day: No intake/output data recorded. Intake/Output this shift: No intake/output data  recorded.  Lab Results:   CBC Recent Labs    07/21/23 2022 07/22/23 0504 07/22/23 1010  WBC 7.6 6.8 7.3  HGB 13.1 12.2 12.6  HCT 41.6 38.2 39.2  MCV 99.5 100.3* 101.3*  PLT 151 142* 150   BMET Recent Labs    07/21/23 2022 07/22/23 0504  NA 137 139  K 4.2 4.9  CL 98 101  CO2 27 29  GLUCOSE 142* 103*  BUN 33* 30*  CREATININE 1.55* 1.47*  CALCIUM  10.1 9.8   LFT Recent Labs    07/21/23 2022  BILITOT 0.7  ALKPHOS 33*  AST 20  ALT 16  PROT 7.5  ALBUMIN 3.5    Lipase No results for input(s): LIPASE in the last 72 hours.  PT/INR Recent Labs    07/21/23 2022  LABPROT 14.8  INR 1.1     Hepatitis Panel No results for input(s): HEPBSAG, HCVAB, HEPAIGM, HEPBIGM in the last 72 hours.   Imaging Studies:   CUP PACEART REMOTE DEVICE CHECK Result Date: 07/02/2023 Pacemaker:  Scheduled remote reviewed. Normal device function.  Presenting rhythm: AP/VS Next remote 91 days. ML, CVRS Gala.Gander week]  Assessment:   78 y/o female with nonobstructive CAD, diastolic heart failure, sick sinus syndrome s/p pacemaker, HTN, DM, hypothyroidism, sleep apnea, Afib on Eliquis , CKD, h/o vulvar cancer s/p surgery/radiation in 2022, with fecal leakage/urgency/tenesmus since radiation, presenting to the ED with several episodes of painless hematochezia.   GI bleeding: several episodes of large volume blood with clots, starting Monday evening. Denies significant abdominal pain but is feeling sore after several BMs. Denies melena. She is on ibuprofen  at bedtime. No prior EGD. Last dose of Eliquis  yesterday at 10:30. Suspect diverticular bleeding. Unlikely dealing with rapid transit UGI bleeding or ischemic colitis. Fortunately she has remained stable with normal Hgb.  -Hgb 13.1-->12.2-->12.6 -Eliquis  on hold   Plan:   Keep on clear liquids. Continue to hold Eliquis . Monitor for recurrent bleeding, trend H/H.  If bleeding does not start to taper, she may require colonoscopy  this admission. Patient aware and agreeable.    LOS: 0 days   We would like to thank you for the opportunity to participate in the care of [employer].  Sonny RAMAN. Ezzard RIGGERS [facility] [phone] 7/9/20259:34 AM

## 2023-07-22 NOTE — ED Notes (Signed)
Ambulated to the bathroom with standby assist.

## 2023-07-22 NOTE — ED Notes (Signed)
 Family updated at this time.

## 2023-07-23 DIAGNOSIS — K922 Gastrointestinal hemorrhage, unspecified: Principal | ICD-10-CM

## 2023-07-23 DIAGNOSIS — K625 Hemorrhage of anus and rectum: Secondary | ICD-10-CM | POA: Diagnosis not present

## 2023-07-23 DIAGNOSIS — D62 Acute posthemorrhagic anemia: Secondary | ICD-10-CM

## 2023-07-23 LAB — CBC
HCT: 38.2 % (ref 36.0–46.0)
HCT: 38.2 % (ref 36.0–46.0)
Hemoglobin: 12.2 g/dL (ref 12.0–15.0)
Hemoglobin: 11.8 g/dL — ABNORMAL LOW (ref 12.0–15.0)
MCH: 32 pg (ref 26.0–34.0)
MCH: 31 pg (ref 26.0–34.0)
MCHC: 31.9 g/dL (ref 30.0–36.0)
MCHC: 30.9 g/dL (ref 30.0–36.0)
MCV: 100.3 fL — ABNORMAL HIGH (ref 80.0–100.0)
MCV: 100.3 fL — ABNORMAL HIGH (ref 80.0–100.0)
Platelets: 156 K/uL (ref 150–400)
Platelets: 176 K/uL (ref 150–400)
RBC: 3.81 MIL/uL — ABNORMAL LOW (ref 3.87–5.11)
RBC: 3.81 MIL/uL — ABNORMAL LOW (ref 3.87–5.11)
RDW: 14.7 % (ref 11.5–15.5)
RDW: 14.7 % (ref 11.5–15.5)
WBC: 5.9 K/uL (ref 4.0–10.5)
WBC: 6.8 K/uL (ref 4.0–10.5)
nRBC: 0 % (ref 0.0–0.2)
nRBC: 0 % (ref 0.0–0.2)

## 2023-07-23 LAB — BASIC METABOLIC PANEL WITH GFR
Anion gap: 8 (ref 5–15)
BUN: 28 mg/dL — ABNORMAL HIGH (ref 8–23)
CO2: 28 mmol/L (ref 22–32)
Calcium: 10.1 mg/dL (ref 8.9–10.3)
Chloride: 101 mmol/L (ref 98–111)
Creatinine, Ser: 1.29 mg/dL — ABNORMAL HIGH (ref 0.44–1.00)
GFR, Estimated: 42 mL/min — ABNORMAL LOW (ref >60)
Glucose, Bld: 108 mg/dL — ABNORMAL HIGH (ref 70–99)
Potassium: 4.5 mmol/L (ref 3.5–5.1)
Sodium: 137 mmol/L (ref 135–145)

## 2023-07-23 LAB — HEMOGLOBIN A1C
Hgb A1c MFr Bld: 5.6 % (ref 4.8–5.6)
Mean Plasma Glucose: 114 mg/dL

## 2023-07-23 LAB — GLUCOSE, CAPILLARY
Glucose-Capillary: 106 mg/dL — ABNORMAL HIGH (ref 70–99)
Glucose-Capillary: 114 mg/dL — ABNORMAL HIGH (ref 70–99)
Glucose-Capillary: 206 mg/dL — ABNORMAL HIGH (ref 70–99)
Glucose-Capillary: 219 mg/dL — ABNORMAL HIGH (ref 70–99)
Glucose-Capillary: 116 mg/dL — ABNORMAL HIGH (ref 70–99)

## 2023-07-23 MED ORDER — INSULIN ASPART 100 UNIT/ML IJ SOLN
0.0000 [IU] | Freq: Three times a day (TID) | INTRAMUSCULAR | Status: DC
  Administered 2023-07-23: 5 [IU] via SUBCUTANEOUS
  Administered 2023-07-24: 3 [IU] via SUBCUTANEOUS

## 2023-07-23 MED ORDER — INSULIN ASPART 100 UNIT/ML IJ SOLN: 0.0000 [IU] | Freq: Every day | INTRAMUSCULAR | Status: DC

## 2023-07-23 NOTE — Progress Notes (Signed)
 PROGRESS NOTE    [name]  FMW:[id number] DOB: 1945-09-12 DOA: 07/21/2023 PCP: Clarice Nottingham, MD    Brief Narrative:  [name] is an 78 y.o. female with medical history significant for hypertension, type 2 diabetes mellitus, sick sinus syndrome with pacer, hypothyroidism, chronic HFpEF, PAF on Eliquis , and chronic diarrhea who presents with rectal bleeding.   Patient reports that yesterday afternoon, she felt as though she was going to have diarrhea but passed mainly just a large volume of red blood.  She had another episode this afternoon that was very similar and she also saw some clots.  She had 2 more episodes while making her way to the ED.   On colonoscopy in November 2023, diverticulosis was noted throughout the colon.   Assessment and Plan: Lower GI bleeding  - Presents with painless hematochezia, had diverticulosis on prior endoscopy, and is hemodynamically stable with normal initial Hgb - Eliquis  on hold - Hemoglobin stable - No further bleeding: Per GI advance to full liquids and likely home in the morning, with likely will need to hold Eliquis  for another 48 hours and if she develops rebleeding will need to do colonoscopy   CAD  - No anginal symptoms currently  - Continue beta-blocker and nitrates    PAF  - Hold Eliquis , continue metoprolol  as tolerated     Chronic HFpEF  - Appears compensated  - Continue diuretics     Hypothyroidism  - Synthroid       CKD 3B  - Appears close to baseline  - Renally-dose medications     DVT prophylaxis: SCDs Start: 07/21/23 2213    Code Status: Full Code   Disposition Plan:  Level of care: Telemetry Status is: Observation     Consultants:  GI   Subjective: No further bleeding  Objective: Vitals:   07/23/23 0919 07/23/23 1000 07/23/23 1100 07/23/23 1157  BP: (!) 128/40 (!) 102/33 (!) 113/38   Pulse: 62 (!) 59 (!) 59   Resp:  17 15   Temp:    97.6 F (36.4 C)  TempSrc:    Oral  SpO2:  94% 95%    Weight:      Height:        Intake/Output Summary (Last 24 hours) at 07/23/2023 1255 Last data filed at 07/23/2023 9077 Gross per 24 hour  Intake 6 ml  Output 2000 ml  Net -1994 ml   Filed Weights   07/21/23 1953 07/22/23 1313 07/22/23 1314  Weight: 78.2 kg 77 kg 77 kg    Examination:   General: Appearance:    Obese female in no acute distress     Lungs:     respirations unlabored  Heart:    Bradycardic.     MS:   All extremities are intact.    Neurologic:   Awake, alert       Data Reviewed: I have personally reviewed following labs and imaging studies  CBC: Recent Labs  Lab 07/21/23 2022 07/22/23 0504 07/22/23 1010 07/22/23 2152 07/23/23 0454  WBC 7.6 6.8 7.3 6.4 5.9  HGB 13.1 12.2 12.6 11.4* 12.2  HCT 41.6 38.2 39.2 35.8* 38.2  MCV 99.5 100.3* 101.3* 100.6* 100.3*  PLT 151 142* 150 163 156   Basic Metabolic Panel: Recent Labs  Lab 07/21/23 2022 07/22/23 0504 07/23/23 0454  NA 137 139 137  K 4.2 4.9 4.5  CL 98 101 101  CO2 27 29 28   GLUCOSE 142* 103* 108*  BUN 33* 30* 28*  CREATININE 1.55* 1.47* 1.29*  CALCIUM  10.1 9.8 10.1   GFR: Estimated Creatinine Clearance: 33.4 mL/min (A) (by C-G formula based on SCr of 1.29 mg/dL (H)). Liver Function Tests: Recent Labs  Lab 07/21/23 2022  AST 20  ALT 16  ALKPHOS 33*  BILITOT 0.7  PROT 7.5  ALBUMIN 3.5   No results for input(s): LIPASE, AMYLASE in the last 168 hours. No results for input(s): AMMONIA in the last 168 hours. Coagulation Profile: Recent Labs  Lab 07/21/23 2022  INR 1.1   Cardiac Enzymes: No results for input(s): CKTOTAL, CKMB, CKMBINDEX, TROPONINI in the last 168 hours. BNP (last 3 results) No results for input(s): PROBNP in the last 8760 hours. HbA1C: Recent Labs    07/21/23 2022  HGBA1C 5.6   CBG: Recent Labs  Lab 07/22/23 1950 07/22/23 2346 07/23/23 0401 07/23/23 0740 07/23/23 1127  GLUCAP 226* 189* 106* 114* 206*   Lipid Profile: No results  for input(s): CHOL, HDL, LDLCALC, TRIG, CHOLHDL, LDLDIRECT in the last 72 hours. Thyroid  Function Tests: No results for input(s): TSH, T4TOTAL, FREET4, T3FREE, THYROIDAB in the last 72 hours. Anemia Panel: No results for input(s): VITAMINB12, FOLATE, FERRITIN, TIBC, IRON, RETICCTPCT in the last 72 hours. Sepsis Labs: No results for input(s): PROCALCITON, LATICACIDVEN in the last 168 hours.  Recent Results (from the past 240 hours)  MRSA Next Gen by PCR, Nasal     Status: None   Collection Time: 07/22/23  1:16 PM   Specimen: Nasal Mucosa; Nasal Swab  Result Value Ref Range Status   MRSA by PCR Next Gen NOT DETECTED NOT DETECTED Final    Comment: (NOTE) The GeneXpert MRSA Assay (FDA approved for NASAL specimens only), is one component of a comprehensive MRSA colonization surveillance program. It is not intended to diagnose MRSA infection nor to guide or monitor treatment for MRSA infections. Test performance is not FDA approved in patients less than 64 years old. Performed at [facility], [address], [place], [place] [address]          Radiology Studies: No results found.      Scheduled Meds:  amiodarone   100 mg Oral Daily   Chlorhexidine  Gluconate Cloth  6 each Topical Daily   feeding supplement  1 Container Oral TID BM   insulin  aspart  0-6 Units Subcutaneous Q4H   irbesartan   300 mg Oral Daily   isosorbide  mononitrate  60 mg Oral Daily   levothyroxine   175 mcg Oral Q0600   metoprolol  tartrate  50 mg Oral BID   sodium chloride  flush  3 mL Intravenous Q12H   spironolactone   25 mg Oral Daily   torsemide   5 mg Oral Daily   Continuous Infusions:   LOS: 0 days    Time spent: 45 minutes spent on chart review, discussion with nursing staff, consultants, updating family and interview/physical exam; more than 50% of that time was spent in counseling and/or coordination of care.    Harlene RAYMOND Bowl, DO [facility] Available via Epic secure chat 7am-7pm After these hours, please refer to coverage provider listed on [email or web address] 07/23/2023, 12:55 PM

## 2023-07-23 NOTE — Progress Notes (Signed)
 Gastroenterology Progress Note   Referring Provider: No ref. provider found Primary Care Physician:  Clarice Nottingham, MD Primary Gastroenterologist:  Ozell Hollingshead, MD  Patient ID: [name]; [id number]; 1945-11-16   Subjective:    States she had her normal first of the morning type stools. Small, soft, and very dark. First BM since she was in the ED yesterday afternoon. Tolerating liquid diet. No abdominal pain.   Objective:   Vital signs in last 24 hours: Temp:  [97.6 F (36.4 C)-98.3 F (36.8 C)] 97.6 F (36.4 C) (07/10 0738) Pulse Rate:  [59-70] 62 (07/10 0919) Resp:  [15-23] 19 (07/10 0601) BP: (88-179)/(24-124) 128/40 (07/10 0919) SpO2:  [93 %-97 %] 94 % (07/10 0257) Weight:  [77 kg] 77 kg (07/09 1314) Last BM Date : 07/22/23 General:   Alert,  Well-developed, well-nourished, pleasant and cooperative in NAD Head:  Normocephalic and atraumatic. Eyes:  Sclera clear, no icterus.   Abdomen:  Soft, nontender and nondistended.  Normal bowel sounds, without guarding, and without rebound.   Extremities:  Without clubbing, deformity or edema. Neurologic:  Alert and  oriented x4;  grossly normal neurologically. Skin:  Intact without significant lesions or rashes. Psych:  Alert and cooperative. Normal mood and affect.  Intake/Output from previous day: 07/09 0701 - 07/10 0700 In: 3 [I.V.:3] Out: 1600 [Urine:1600] Intake/Output this shift: Total I/O In: 3 [I.V.:3] Out: 400 [Urine:400]  Lab Results: CBC Recent Labs    07/22/23 1010 07/22/23 2152 07/23/23 0454  WBC 7.3 6.4 5.9  HGB 12.6 11.4* 12.2  HCT 39.2 35.8* 38.2  MCV 101.3* 100.6* 100.3*  PLT 150 163 156   BMET Recent Labs    07/21/23 2022 07/22/23 0504 07/23/23 0454  NA 137 139 137  K 4.2 4.9 4.5  CL 98 101 101  CO2 27 29 28   GLUCOSE 142* 103* 108*  BUN 33* 30* 28*  CREATININE 1.55* 1.47* 1.29*  CALCIUM  10.1 9.8 10.1   LFTs Recent Labs    07/21/23 2022  BILITOT 0.7  ALKPHOS 33*  AST 20   ALT 16  PROT 7.5  ALBUMIN 3.5   No results for input(s): LIPASE in the last 72 hours. PT/INR Recent Labs    07/21/23 2022  LABPROT 14.8  INR 1.1         Imaging Studies: CUP PACEART REMOTE DEVICE CHECK Result Date: 07/02/2023 Pacemaker:  Scheduled remote reviewed. Normal device function.  Presenting rhythm: AP/VS Next remote 91 days. ML, CVRS [2 weeks]  Assessment:    78 y/o female with nonobstructive CAD, diastolic heart failure, sick sinus syndrome s/p pacemaker, HTN, DM, hypothyroidism, sleep apnea, Afib on Eliquis , CKD, h/o vulvar cancer s/p surgery/radiation in 2022, with fecal leakage/urgency/tenesmus since radiation, presenting to the ED with several episodes of painless hematochezia.    GI bleeding: several episodes of large volume blood with clots, starting Monday evening. Denies significant abdominal pain but is feeling sore after several BMs. Denies melena. She is on ibuprofen  at bedtime. No prior EGD. Last dose of Eliquis  Tuesday morning at 10:30. Suspect diverticular bleeding.  Fortunately she has remained relatively stable with normal Hgb. Per nursing her stool today was dark with light pink mixed. Appears bleeding has nearly resolved.  -Hgb 13.1-->12.2-->12.6-->11.4-->12.2. -Eliquis  remains on hold    Plan:   Advance to full liquids.  Likely home within 24 hours.  To determine timing of restarting Eliquis , anticipate holding another 48 hours.  If she develops recurrent diverticular bleeding or signs of other significant  rectal bleeding in the future, would consider colonoscopy.    LOS: 0 days   Sonny RAMAN. Ezzard RIGGERS [facility] [phone] 7/10/20259:42 AM

## 2023-07-23 NOTE — Plan of Care (Signed)
  Problem: Coping: Goal: Ability to adjust to condition or change in health will improve Outcome: Progressing   Problem: Skin Integrity: Goal: Risk for impaired skin integrity will decrease Outcome: Progressing   Problem: Tissue Perfusion: Goal: Adequacy of tissue perfusion will improve Outcome: Progressing   Problem: Education: Goal: Knowledge of General Education information will improve Description: Including pain rating scale, medication(s)/side effects and non-pharmacologic comfort measures Outcome: Progressing   Problem: Nutrition: Goal: Adequate nutrition will be maintained Outcome: Progressing   Problem: Coping: Goal: Level of anxiety will decrease Outcome: Progressing   Problem: Pain Managment: Goal: General experience of comfort will improve and/or be controlled Outcome: Progressing   Problem: Safety: Goal: Ability to remain free from injury will improve Outcome: Progressing

## 2023-07-23 NOTE — Plan of Care (Signed)
  Problem: Education: Goal: Knowledge of General Education information will improve Description: Including pain rating scale, medication(s)/side effects and non-pharmacologic comfort measures Outcome: Progressing   Problem: Clinical Measurements: Goal: Respiratory complications will improve Outcome: Progressing Goal: Cardiovascular complication will be avoided Outcome: Progressing   Problem: Nutrition: Goal: Adequate nutrition will be maintained Outcome: Progressing   Problem: Coping: Goal: Level of anxiety will decrease Outcome: Progressing   Problem: Pain Managment: Goal: General experience of comfort will improve and/or be controlled Outcome: Progressing

## 2023-07-24 ENCOUNTER — Telehealth: Payer: Self-pay | Admitting: Gastroenterology

## 2023-07-24 DIAGNOSIS — K922 Gastrointestinal hemorrhage, unspecified: Secondary | ICD-10-CM | POA: Diagnosis not present

## 2023-07-24 DIAGNOSIS — K625 Hemorrhage of anus and rectum: Secondary | ICD-10-CM | POA: Diagnosis not present

## 2023-07-24 LAB — CBC
HCT: 36.2 % (ref 36.0–46.0)
Hemoglobin: 11.4 g/dL — ABNORMAL LOW (ref 12.0–15.0)
MCH: 31.1 pg (ref 26.0–34.0)
MCHC: 31.5 g/dL (ref 30.0–36.0)
MCV: 98.9 fL (ref 80.0–100.0)
Platelets: 158 K/uL (ref 150–400)
RBC: 3.66 MIL/uL — ABNORMAL LOW (ref 3.87–5.11)
RDW: 14.6 % (ref 11.5–15.5)
WBC: 6 K/uL (ref 4.0–10.5)
nRBC: 0 % (ref 0.0–0.2)

## 2023-07-24 LAB — BASIC METABOLIC PANEL WITH GFR
Anion gap: 11 (ref 5–15)
BUN: 32 mg/dL — ABNORMAL HIGH (ref 8–23)
CO2: 24 mmol/L (ref 22–32)
Calcium: 10.3 mg/dL (ref 8.9–10.3)
Chloride: 101 mmol/L (ref 98–111)
Creatinine, Ser: 1.36 mg/dL — ABNORMAL HIGH (ref 0.44–1.00)
GFR, Estimated: 39 mL/min — ABNORMAL LOW (ref >60)
Glucose, Bld: 122 mg/dL — ABNORMAL HIGH (ref 70–99)
Potassium: 4 mmol/L (ref 3.5–5.1)
Sodium: 136 mmol/L (ref 135–145)

## 2023-07-24 LAB — GLUCOSE, CAPILLARY
Glucose-Capillary: 127 mg/dL — ABNORMAL HIGH (ref 70–99)
Glucose-Capillary: 199 mg/dL — ABNORMAL HIGH (ref 70–99)

## 2023-07-24 MED ORDER — FENOFIBRATE 160 MG PO TABS
160.0000 mg | ORAL_TABLET | Freq: Every day | ORAL | Status: AC
Start: 2023-07-24 — End: ?

## 2023-07-24 MED ORDER — DAPAGLIFLOZIN PROPANEDIOL 10 MG PO TABS: 10.0000 mg | ORAL_TABLET | Freq: Every day | ORAL | Status: AC

## 2023-07-24 MED ORDER — APIXABAN 2.5 MG PO TABS
2.5000 mg | ORAL_TABLET | Freq: Two times a day (BID) | ORAL | Status: DC
Start: 2023-07-24 — End: 2023-10-12

## 2023-07-24 MED ORDER — APIXABAN 2.5 MG PO TABS
2.5000 mg | ORAL_TABLET | Freq: Two times a day (BID) | ORAL | Status: DC
  Administered 2023-07-24: 2.5 mg via ORAL
  Filled 2023-07-24: qty 1

## 2023-07-24 MED ORDER — INSULIN ASPART PROT & ASPART (70-30 MIX) 100 UNIT/ML ~~LOC~~ SUSP: SUBCUTANEOUS | 11 refills | Status: AC

## 2023-07-24 NOTE — Telephone Encounter (Signed)
 Please arrange hospital follow up in 2-4 weeks with [facility].

## 2023-07-24 NOTE — Discharge Summary (Addendum)
 Physician Discharge Summary  [name] FMW:[id number] DOB: [birth year] DOA: 07/21/2023  PCP: Clarice Nottingham, MD  Admit date: 07/21/2023 Discharge date: 07/24/2023  Admitted From: Home Discharge disposition: Home   Recommendations for Outpatient Follow-Up:   avoid medications with ibuprofen   Will need colonoscopy if bleeding reoccurs   Discharge Diagnosis:   Principal Problem:   Rectal bleeding Active Problems:   Stage 3b chronic kidney disease (CKD) (HCC)   CAD (coronary artery disease)   Paroxysmal atrial fibrillation (HCC)   Diabetes mellitus without complication (HCC)   Hypothyroidism   Chronic diastolic heart failure (HCC)   Acute GI bleeding   Acute post-hemorrhagic anemia    Discharge Condition: Improved.  Diet recommendation: Soft  Wound care: None.  Code status: Full.   History of Present Illness:   [name] is an 78 y.o. female with medical history significant for hypertension, type 2 diabetes mellitus, sick sinus syndrome with pacer, hypothyroidism, chronic HFpEF, PAF on Eliquis , and chronic diarrhea who presents with rectal bleeding.   Patient reports that yesterday afternoon, she felt as though she was going to have diarrhea but passed mainly just a large volume of red blood.  She had another episode this afternoon that was very similar and she also saw some clots.  She had 2 more episodes while making her way to the ED.  She has some lightheadedness associated with this but denies syncope, abdominal pain, nausea, vomiting, fever, or chills.  She has never experienced this previously.   On colonoscopy in November 2023, diverticulosis was noted throughout the colon.     Hospital Course by Problem:   Lower GI bleeding  - Presents with painless hematochezia, had diverticulosis on prior endoscopy, and is hemodynamically stable with normal initial Hgb - Hemoglobin stable - No further bleeding: Per GI: advance diet, okay to resume Eliquis ,  if no further bleeding can discharge home -Avoid ibuprofen    CAD  - No anginal symptoms currently  - Continue beta-blocker and nitrates    PAF  - Resume home meds   Chronic HFpEF  - Appears compensated  - Continue diuretics     Hypothyroidism  - Synthroid       CKD 3B  - Appears close to baseline  - Renally-dose medications     Diabetes type 2 - Resume home meds    Medical Consultants:   GI   Discharge Exam:   Vitals:   07/24/23 0020 07/24/23 0444  BP: 106/76 (!) 120/54  Pulse: 60 66  Resp: 18 18  Temp: 98 F (36.7 C) 97.9 F (36.6 C)  SpO2: 93% 93%   Vitals:   07/23/23 2220 07/23/23 2236 07/24/23 0020 07/24/23 0444  BP: (!) 118/52 (!) 118/52 106/76 (!) 120/54  Pulse: 60 60 60 66  Resp: 18  18 18   Temp: 98.1 F (36.7 C)  98 F (36.7 C) 97.9 F (36.6 C)  TempSrc: Oral  Oral Oral  SpO2: 97%  93% 93%  Weight:      Height:        General exam: Appears calm and comfortable.     The results of significant diagnostics from this hospitalization (including imaging, microbiology, ancillary and laboratory) are listed below for reference.     Procedures and Diagnostic Studies:   No results found.   Labs:   Basic Metabolic Panel: Recent Labs  Lab 07/21/23 2022 07/22/23 0504 07/23/23 0454 07/24/23 0442  NA 137 139 137 136  K 4.2 4.9 4.5  4.0  CL 98 101 101 101  CO2 27 29 28 24   GLUCOSE 142* 103* 108* 122*  BUN 33* 30* 28* 32*  CREATININE 1.55* 1.47* 1.29* 1.36*  CALCIUM  10.1 9.8 10.1 10.3   GFR Estimated Creatinine Clearance: 31.7 mL/min (A) (by C-G formula based on SCr of 1.36 mg/dL (H)). Liver Function Tests: Recent Labs  Lab 07/21/23 2022  AST 20  ALT 16  ALKPHOS 33*  BILITOT 0.7  PROT 7.5  ALBUMIN 3.5   No results for input(s): LIPASE, AMYLASE in the last 168 hours. No results for input(s): AMMONIA in the last 168 hours. Coagulation profile Recent Labs  Lab 07/21/23 2022  INR 1.1    CBC: Recent Labs  Lab  07/22/23 1010 07/22/23 2152 07/23/23 0454 07/23/23 1400 07/24/23 0442  WBC 7.3 6.4 5.9 6.8 6.0  HGB 12.6 11.4* 12.2 11.8* 11.4*  HCT 39.2 35.8* 38.2 38.2 36.2  MCV 101.3* 100.6* 100.3* 100.3* 98.9  PLT 150 163 156 176 158   Cardiac Enzymes: No results for input(s): CKTOTAL, CKMB, CKMBINDEX, TROPONINI in the last 168 hours. BNP: Invalid input(s): POCBNP CBG: Recent Labs  Lab 07/23/23 1127 07/23/23 1547 07/23/23 2059 07/24/23 0738 07/24/23 1109  GLUCAP 206* 219* 116* 127* 199*   D-Dimer No results for input(s): DDIMER in the last 72 hours. Hgb A1c Recent Labs    07/21/23 2022  HGBA1C 5.6   Lipid Profile No results for input(s): CHOL, HDL, LDLCALC, TRIG, CHOLHDL, LDLDIRECT in the last 72 hours. Thyroid  function studies No results for input(s): TSH, T4TOTAL, T3FREE, THYROIDAB in the last 72 hours.  Invalid input(s): FREET3 Anemia work up No results for input(s): VITAMINB12, FOLATE, FERRITIN, TIBC, IRON, RETICCTPCT in the last 72 hours. Microbiology Recent Results (from the past 240 hours)  MRSA Next Gen by PCR, Nasal     Status: None   Collection Time: 07/22/23  1:16 PM   Specimen: Nasal Mucosa; Nasal Swab  Result Value Ref Range Status   MRSA by PCR Next Gen NOT DETECTED NOT DETECTED Final    Comment: (NOTE) The GeneXpert MRSA Assay (FDA approved for NASAL specimens only), is one component of a comprehensive MRSA colonization surveillance program. It is not intended to diagnose MRSA infection nor to guide or monitor treatment for MRSA infections. Test performance is not FDA approved in patients less than 21 years old. Performed at [facility], [address], [place], [place] [address]      Discharge Instructions:   Discharge Instructions     Diet general   Complete by: As directed    Discharge instructions   Complete by: As directed    avoid medications with ibuprofen    Increase activity slowly   Complete  by: As directed       Allergies as of 07/24/2023       Reactions   Hydrocodone  Other (See Comments)   Lethargic   Invokana [canagliflozin] Palpitations, Other (See Comments)   Made heart race   Oxycodone Other (See Comments)   Lethargic        Medication List     TAKE these medications    amiodarone  200 MG tablet Commonly known as: PACERONE  Take 1/2 (one-half) tablet by mouth once daily   apixaban  2.5 MG Tabs tablet Commonly known as: ELIQUIS  Take 1 tablet (2.5 mg total) by mouth 2 (two) times daily.   dapagliflozin  propanediol 10 MG Tabs tablet Commonly known as: Farxiga  Take 1 tablet (10 mg total) by mouth daily before breakfast.  fenofibrate  160 MG tablet Take 1 tablet (160 mg total) by mouth daily.   insulin  aspart protamine- aspart (70-30) 100 UNIT/ML injection Commonly known as: NovoLOG  Mix 70/30 INJECT 20 UNITS IN THE MORNING AND 10 UNITS IN THE EVENING SUBCUTANEOUSLY TITRATE TO DAILY MAX OF 100 UNITS   isosorbide  mononitrate 60 MG 24 hr tablet Commonly known as: IMDUR  Take 1 tablet by mouth once daily   levothyroxine  175 MCG tablet Commonly known as: SYNTHROID  Take 175 mcg by mouth daily.   metoprolol  tartrate 50 MG tablet Commonly known as: LOPRESSOR  Take 1 tablet by mouth twice daily   olmesartan  40 MG tablet Commonly known as: BENICAR  Take 40 mg by mouth daily.   spironolactone  25 MG tablet Commonly known as: ALDACTONE  Take 1 tablet by mouth once daily   torsemide  5 MG tablet Commonly known as: DEMADEX  Take 5 mg by mouth daily. Take 10 mg on day 1 alternate with 20 mg daily          Time coordinating discharge: 45 min  Signed:  Harlene RAYMOND Bowl DO  [facility] 07/24/2023, 2:20 PM

## 2023-07-24 NOTE — Plan of Care (Signed)
  Problem: Fluid Volume: Goal: Ability to maintain a balanced intake and output will improve Outcome: Progressing   Problem: Health Behavior/Discharge Planning: Goal: Ability to manage health-related needs will improve Outcome: Progressing   Problem: Education: Goal: Knowledge of General Education information will improve Description: Including pain rating scale, medication(s)/side effects and non-pharmacologic comfort measures Outcome: Progressing

## 2023-07-24 NOTE — Progress Notes (Addendum)
 Gastroenterology Progress Note   Referring Provider: No ref. provider found Primary Care Physician:  Clarice Nottingham, MD Primary Gastroenterologist:  Lamar HERO.Rourk, MD  Patient ID: [name]; [id number]; 1945/08/24    Subjective   Tired this morning but did not sleep the best last night. Able to tolerate her breakfast this morning. No BM yest this morning. Reports normal BM yesterday afternoon without bleeding. No abdominal pain, shortness of breath, chest pain.    Objective   Vital signs in last 24 hours Temp:  [97.6 F (36.4 C)-98.2 F (36.8 C)] 97.9 F (36.6 C) (07/11 0444) Pulse Rate:  [58-66] 66 (07/11 0444) Resp:  [15-20] 18 (07/11 0444) BP: (106-127)/(51-76) 120/54 (07/11 0444) SpO2:  [93 %-97 %] 93 % (07/11 0444) Last BM Date : 07/23/23  Physical Exam General:   Alert and oriented, pleasant. Lying in bed.  Head:  Normocephalic and atraumatic. Eyes:  No icterus, sclera clear. Conjuctiva pink.  Mouth:  Without lesions, mucosa pink and moist.  Neck:  Supple, without thyromegaly or masses.   Abdomen:  non-distended. Neurologic:  Alert and  oriented x4;  grossly normal neurologically. Psych:  Alert and cooperative. Normal mood and affect.  Intake/Output from previous day: 07/10 0701 - 07/11 0700 In: 486 [P.O.:480; I.V.:6] Out: 400 [Urine:400] Intake/Output this shift: Total I/O In: 240 [P.O.:240] Out: -   Lab Results  Recent Labs    07/23/23 0454 07/23/23 1400 07/24/23 0442  WBC 5.9 6.8 6.0  HGB 12.2 11.8* 11.4*  HCT 38.2 38.2 36.2  PLT 156 176 158   BMET Recent Labs    07/22/23 0504 07/23/23 0454 07/24/23 0442  NA 139 137 136  K 4.9 4.5 4.0  CL 101 101 101  CO2 29 28 24   GLUCOSE 103* 108* 122*  BUN 30* 28* 32*  CREATININE 1.47* 1.29* 1.36*  CALCIUM  9.8 10.1 10.3   LFT Recent Labs    07/21/23 2022  PROT 7.5  ALBUMIN 3.5  AST 20  ALT 16  ALKPHOS 33*  BILITOT 0.7   PT/INR Recent Labs    07/21/23 2022  LABPROT 14.8  INR  1.1   Hepatitis Panel No results for input(s): HEPBSAG, HCVAB, HEPAIGM, HEPBIGM in the last 72 hours.   Studies/Results CUP PACEART REMOTE DEVICE CHECK Result Date: 07/02/2023 Pacemaker:  Scheduled remote reviewed. Normal device function.  Presenting rhythm: AP/VS Next remote 91 days. ML, CVRS   Assessment  78 y.o. female with a history of diastolic heart failure, nonobstructive CAD, SSS s/p pacemaker, DM, HTN, hypothyroidism, sleep apnea, Afib on eliqius, CKD, h/o vulvar cancer s/p surgery/radiation in 2022 with fecal leakage/urgency/tenesmus since radiation who presented to the ED with several episodes of painless hematochezia. GI consulted for further evaluation.   Lower GI bleed:  - presented with several episodes of large volume bleeding with clots that started evening of 7/7 - initial with abdominal soreness and no overt pain.  - On NSAIDs daily at bedtime and eliquis  for Afib.  - Hgb 13.1>>12.2>>12.6>>11.4>>12.2>>11.8>>11.4 - No further rectal bleeding today, nursing reported pink tinge stool yesterday. She denies evening stool being dark, normal brown color reported (no documentation in chart).  - Etiology likely diverticular in nature however if bleeding recurs would recommend consideration for colonoscopy.  - Given Hgb stable will resume eliquis  today and if no further bleeding by afternoon could safely discharge home this evening with outpatient follow up.   Plan / Recommendations  Resume Eliquis  Monitor for recurrent bleeding, if occurs will consider colonoscopy.  Regular diet Follow up in office in 2-4 weeks.   OK to discharge if no bleeding this evening. Recommendations relayed to hospitalist.    LOS: 0 days    07/24/2023, 11:11 AM   Charmaine Melia, MSN, FNP-BC, AGACNP-BC [facility]

## 2023-07-24 NOTE — Discharge Instructions (Signed)
 AVOID ANY IBUPROFEN  PRODUCTS/NSAIDS

## 2023-07-24 NOTE — Progress Notes (Signed)
 PHARMACY - ANTICOAGULATION CONSULT NOTE  Pharmacy Consult for Apixaban  Indication: atrial fibrillation  Patient Measurements: Height: 5' 2 (157.5 cm) Weight: 77 kg (169 lb 12.1 oz) IBW/kg (Calculated) : 50.1 HEPARIN  DW (KG): 66.9  Vital Signs: Temp: 97.9 F (36.6 C) (07/11 0444) Temp Source: Oral (07/11 0444) BP: 120/54 (07/11 0444) Pulse Rate: 66 (07/11 0444)  Labs: Recent Labs    07/21/23 2022 07/22/23 0504 07/22/23 1010 07/23/23 0454 07/23/23 1400 07/24/23 0442  HGB 13.1 12.2   < > 12.2 11.8* 11.4*  HCT 41.6 38.2   < > 38.2 38.2 36.2  PLT 151 142*   < > 156 176 158  LABPROT 14.8  --   --   --   --   --   INR 1.1  --   --   --   --   --   CREATININE 1.55* 1.47*  --  1.29*  --  1.36*   < > = values in this interval not displayed.    Estimated Creatinine Clearance: 31.7 mL/min (A) (by C-G formula based on SCr of 1.36 mg/dL (H)).  Assessment: 78 yoF hx PAF on eliquis  pta admitted with lower GI Bleed, likley diverticular and self-limiting. Ok to resume anticoagulation per GI. Given age, borderline renal, and bleed risk will resume eliquis  at lower dose (she was on this pta)  Plan:  Apixaban  2.5mg  po BID Monitor for adverse events  Gregorio Cara, PharmD Clinical Pharmacist 07/24/2023 8:54 AM

## 2023-07-24 NOTE — Telephone Encounter (Signed)
 [name]/[name]/[name]: Will someone also please let patient know that with recent bleeding and taking blood thinners, she should avoid medications with ibuprofen . She is taking a sleep aid medication with mixture of ibuprofen /benadryl. Dr. Shaaron advised not to take this.

## 2023-07-26 DIAGNOSIS — E1165 Type 2 diabetes mellitus with hyperglycemia: Secondary | ICD-10-CM | POA: Diagnosis not present

## 2023-07-27 NOTE — Telephone Encounter (Signed)
 [name],  Per [facility] please call and schedule pt for 2-4  week follow up with Sonny Kerns  FYI:  Phoned and advised the pt of instructions not to take Ibuprofen  / sleep aids and ibuprofen  / instructions advised by Dr Shaaron. Pt expressed understanding and stated she has not taken anything last few nights. States when she comes in she will see what she can take if needed

## 2023-07-27 NOTE — Telephone Encounter (Signed)
 noted

## 2023-07-28 NOTE — Telephone Encounter (Signed)
 Noted

## 2023-07-29 DIAGNOSIS — Z09 Encounter for follow-up examination after completed treatment for conditions other than malignant neoplasm: Secondary | ICD-10-CM | POA: Diagnosis not present

## 2023-07-29 DIAGNOSIS — Z8719 Personal history of other diseases of the digestive system: Secondary | ICD-10-CM | POA: Diagnosis not present

## 2023-07-29 DIAGNOSIS — D500 Iron deficiency anemia secondary to blood loss (chronic): Secondary | ICD-10-CM | POA: Diagnosis not present

## 2023-07-30 ENCOUNTER — Ambulatory Visit: Payer: Medicare HMO | Admitting: Cardiology

## 2023-08-04 ENCOUNTER — Other Ambulatory Visit: Payer: Self-pay | Admitting: Cardiology

## 2023-08-25 ENCOUNTER — Encounter: Payer: Self-pay | Admitting: Gynecologic Oncology

## 2023-08-26 DIAGNOSIS — E1165 Type 2 diabetes mellitus with hyperglycemia: Secondary | ICD-10-CM | POA: Diagnosis not present

## 2023-08-28 ENCOUNTER — Inpatient Hospital Stay: Payer: HMO | Attending: Gynecologic Oncology | Admitting: Gynecologic Oncology

## 2023-08-28 ENCOUNTER — Encounter: Payer: Self-pay | Admitting: Gynecologic Oncology

## 2023-08-28 VITALS — BP 135/48 | HR 86 | Temp 98.2°F | Resp 19 | Wt 169.0 lb

## 2023-08-28 DIAGNOSIS — Z8544 Personal history of malignant neoplasm of other female genital organs: Secondary | ICD-10-CM | POA: Diagnosis not present

## 2023-08-28 DIAGNOSIS — N904 Leukoplakia of vulva: Secondary | ICD-10-CM | POA: Diagnosis not present

## 2023-08-28 DIAGNOSIS — L900 Lichen sclerosus et atrophicus: Secondary | ICD-10-CM

## 2023-08-28 DIAGNOSIS — C519 Malignant neoplasm of vulva, unspecified: Secondary | ICD-10-CM

## 2023-08-28 DIAGNOSIS — Z923 Personal history of irradiation: Secondary | ICD-10-CM | POA: Insufficient documentation

## 2023-08-28 DIAGNOSIS — K529 Noninfective gastroenteritis and colitis, unspecified: Secondary | ICD-10-CM

## 2023-08-28 DIAGNOSIS — N9089 Other specified noninflammatory disorders of vulva and perineum: Secondary | ICD-10-CM

## 2023-08-28 NOTE — Patient Instructions (Addendum)
 It was good to see you today.    Melissa will see you back in 3 months and I will see you for follow-up in 6 months.  As always, if you develop any new and concerning symptoms before your next visit, please call to see me sooner.

## 2023-08-28 NOTE — Progress Notes (Signed)
 Gynecologic Oncology Return Clinic Visit  08/28/23  Reason for Visit: Follow-up in the setting of vulvar cancer    Treatment History: Oncology History  Vulvar cancer (HCC)  07/20/2020 Initial Diagnosis   Vulvar cancer (HCC)   07/26/2020 Surgery   EUA, vulvar biopsies  Findings: Pictures taken in media.  On exam, there is about a 4 x 3-1/2 cm area concerning for involvement by carcinoma that spans between the clitoris (replacing the bottom aspect of the clitoris) to just superior to the urethra.  This is a butterfly lesion that extends along the inner labia bilaterally.  Previous biopsy site is healing well with suture still intact.  There is a firmer and raised area that is approximately 1 cm around the area of the biopsy.  Biopsies taken circumferentially around the lesion noted as well as from the central aspect to help delineate what is cancer and what may be chronic dermatoses.  Given appearance today with patient more comfortable, I am suspicious that the entire lesion is carcinoma.   07/26/2020 Pathology Results   A. VULVA, 6 OCLOCK, INFERIOR ASPECT, BIOPSY:  - Superficial fragments of squamous cell carcinoma.   B. VULVA, 11 OCLOCK, BIOPSY:  - Invasive squamous cell carcinoma.  - No lymphovascular invasion.   C. VULVA, 2 OCLOCK, BIOPSY:  - Invasive squamous cell carcinoma.  - No lymphovascular invasion.   D. VULVA, CENTRAL, BIOPSY:  - Superficial fragment of squamous cell carcinoma.    08/06/2020 Imaging   PET: no evidence of metastatic disease   08/07/2020 Clinical Stage   Stage IB   08/22/2020 Surgery   Partial radical anterior vulvectomy with right inguinal SLN biopsy.  Findings: 4x4cm butterfly lesion on the anterior vulva, spanning from just superior to the clitoris to just superior to the urethral meatus. Lesion crossed the midline, more significant lesion on the right. LSG prior to surgery showed bilateral mapping. No hot or blue SLN identified on the left, mildly blue  and hot node identified as the SLN on the right. Decision made to abort left-sided LND given hypertension and difficulty ventilating. Additionally, given intra-op cardiac and respiratory status, decision made to proceed with a less radical resection (very close if not positive peri-urethral margin visibly.   08/22/2020 Pathology Results   Stage IB, lymph node assessment only on the right given comorbidities and cardiac issues during surgery  Tumor Focality:    Unifocal     Tumor Site:    Right vulva     Tumor Site:    Left vulva     Tumor Size:    Greatest Dimension (Centimeters): 4 cm     Histologic Type:    Squamous cell carcinoma, HPV-independent     Histologic Grade:    G3, poorly differentiated     Depth of Tumor Invasion:    6 mm     Tumor Border:    Infiltrating     Other Tissue / Organ Involvement:    Not applicable     Lymphovascular Invasion:    Not identified   MARGINS     Margin Status for Invasive Carcinoma:    All margins negative for invasive carcinoma       Closest Margin(s) to Invasive Carcinoma:    Peripheral: 12:00       Distance from Invasive Carcinoma to Closest Margin:    1 mm     Margin Status for HSIL (VIN2-3) or dVIN:    All margins negative for high-grade squamous intraepithelial lesion (HSIL) and /  or differentiated vulvar intraepithelial neoplasia (dVIN)   REGIONAL LYMPH NODES     Regional Lymph Node Status:           :    All regional lymph nodes negative for tumor cells       Total Number of Lymph Nodes Examined:    1         Nodal Site(s) Examined:    Right inguinal       Number of Sentinel Nodes Examined:    1    09/06/2020 Genetic Testing   Negative genetic testing:  No pathogenic variants detected on the [facility] Multi-Cancer + RNA panel. A variant of uncertain significance (VUS) was detected in the POLD1 gene called c.427G>A (p.Gly143Ser). The report date is 09/06/2020.  The Multi-Cancer + RNA Panel offered by [facility] includes sequencing and/or  deletion/duplication analysis of the following 84 genes:  AIP*, ALK, APC*, ATM*, AXIN2*, BAP1*, BARD1*, BLM*, BMPR1A*, BRCA1*, BRCA2*, BRIP1*, CASR, CDC73*, CDH1*, CDK4, CDKN1B*, CDKN1C*, CDKN2A, CEBPA, CHEK2*, CTNNA1*, DICER1*, DIS3L2*, EGFR, EPCAM, FH*, FLCN*, GATA2*, GPC3, GREM1, HOXB13, HRAS, KIT, MAX*, MEN1*, MET, MITF, MLH1*, MSH2*, MSH3*, MSH6*, MUTYH*, NBN*, NF1*, NF2*, NTHL1*, PALB2*, PDGFRA, PHOX2B, PMS2*, POLD1*, POLE*, POT1*, PRKAR1A*, PTCH1*, PTEN*, RAD50*, RAD51C*, RAD51D*, RB1*, RECQL4, RET, RUNX1*, SDHA*, SDHAF2*, SDHB*, SDHC*, SDHD*, SMAD4*, SMARCA4*, SMARCB1*, SMARCE1*, STK11*, SUFU*, TERC, TERT, TMEM127*, Tp53*, TSC1*, TSC2*, VHL*, WRN*, and WT1.  RNA analysis is performed for * genes.    10/23/2020 - 12/14/2020 Radiation Therapy   10/23/2020 through 12/14/2020 Site Technique Total Dose (Gy) Dose per Fx (Gy) Completed Fx Beam Energies  Vulva: Pelvis IMRT 50.4/50.4 1.8 28/28 6X  Vulva: Pelvis_Bst IMRT 9/9 1.8 5/5 6X         Interval History: The patient was admitted in July by with bright red blood per rectum.  This has resolved, denies any bleeding since her admission.  She has follow-up with GI on Monday.  Is on a new medication after stopping Imodium  that is helping with her diarrhea but she still endorses at least 1 episode of diarrhea a day.  Denies any vaginal bleeding or discharge.  Still has soreness of her vulva, feels like biopsy site from the last time has not healed.  Has very occasional pruritus.  Continues to struggle with urinary incontinence.  Past Medical/Surgical History: Past Medical History:  Diagnosis Date   Arthritis    CAD (coronary artery disease)    Diabetes mellitus without complication (HCC)    Diastolic heart failure (HCC)    Dyspnea    Encounter for care of pacemaker 09/03/2018   Family history of kidney cancer    Family history of throat cancer    Family history of uterine cancer    History of chickenpox    History of radiation therapy     Pelvis 10/23/20-12/14/20- Dr. Lynwood Nasuti   Hx of psoriatic arthritis    Hyperlipemia    Hypertension    Hypertension 05/03/2018   Hypothyroidism    Mitral valve disorders(424.0)    Myocardial infarction (HCC)    Obstructive sleep apnea    OSA (obstructive sleep apnea)    using BiPAP   Pacemaker    Paroxysmal atrial fibrillation (HCC) 10/11/2016   Peripheral neuropathy    Renal disorder Kidney disease stage2   Sick sinus syndrome [facility])    S/p dual-chamber pacemaker   Thyroid  disease hypothyroid   Urine incontinence    Vitamin D  deficiency    Vulvar cancer (HCC) 2022   s/p surgery and  radiation therapy October-December 2022    Past Surgical History:  Procedure Laterality Date   BACK SURGERY     BACK SURGERY     BIOPSY  11/20/2021   Procedure: BIOPSY;  Surgeon: Shaaron Lamar HERO, MD;  Location: [facility];  Service: Endoscopy;;   CARDIAC CATHETERIZATION N/A 04/29/2015   Procedure: Temporary Pacemaker;  Surgeon: Gordy Bergamo, MD;  Location: [facility] INVASIVE CV LAB;  Service: Cardiovascular;  Laterality: N/A;   CARDIOVERSION N/A 09/07/2018   Procedure: CARDIOVERSION;  Surgeon: Bergamo Gordy, MD;  Location: [facility] ENDOSCOPY;  Service: Cardiovascular;  Laterality: N/A;   CARDIOVERSION     COLONOSCOPY  08/16/2008   Dr. Shaaron; pancolonic diverticulosis, otherwise no abnormalities.  Consider repeat in 10 years.   COLONOSCOPY WITH PROPOFOL  N/A 11/20/2021   Procedure: COLONOSCOPY WITH PROPOFOL ;  Surgeon: Shaaron Lamar HERO, MD;  Location: [facility];  Service: Endoscopy;  Laterality: N/A;  8:15am, asa 3   CORONARY PRESSURE/FFR STUDY N/A 08/31/2017   Procedure: INTRAVASCULAR PRESSURE WIRE/FFR STUDY;  Surgeon: Elmira Newman PARAS, MD;  Location: [facility] INVASIVE CV LAB;  Service: Cardiovascular;  Laterality: N/A;   EP IMPLANTABLE DEVICE N/A 04/30/2015   Procedure: Pacemaker Implant;  Surgeon: Danelle LELON Birmingham, MD;  Location: [facility] INVASIVE CV LAB;  Service: Cardiovascular;  Laterality: N/A;   LEFT AND RIGHT  HEART CATHETERIZATION WITH CORONARY ANGIOGRAM N/A 09/30/2011   Procedure: LEFT AND RIGHT HEART CATHETERIZATION WITH CORONARY ANGIOGRAM;  Surgeon: Erick JONELLE Bergamo, MD;  Location: [facility] CATH LAB;  Service: Cardiovascular;  Laterality: N/A;   POLYPECTOMY  11/20/2021   Procedure: POLYPECTOMY;  Surgeon: Shaaron Lamar HERO, MD;  Location: [facility];  Service: Endoscopy;;   RIGHT/LEFT HEART CATH AND CORONARY ANGIOGRAPHY N/A 08/31/2017   Procedure: RIGHT/LEFT HEART CATH AND CORONARY ANGIOGRAPHY;  Surgeon: Elmira Newman PARAS, MD;  Location: [facility] INVASIVE CV LAB;  Service: Cardiovascular;  Laterality: N/A;   TOOTH EXTRACTION  10/07/2018   TUBAL LIGATION     VULVA MARYBETH BIOPSY N/A 07/26/2020   Procedure: VULVAR BIOPSY;  Surgeon: Viktoria Comer JONELLE, MD;  Location: [facility] ORS;  Service: Gynecology;  Laterality: N/A;   YAG LASER APPLICATION Right 03/07/2014   Procedure: YAG LASER APPLICATION;  Surgeon: Oneil T. Roz, MD;  Location: [facility] ORS;  Service: Ophthalmology;  Laterality: Right;  pt knows to arrive at 11:15   YAG LASER APPLICATION Left 03/21/2014   Procedure: YAG LASER APPLICATION;  Surgeon: Oneil Roz, MD;  Location: [facility] ORS;  Service: Ophthalmology;  Laterality: Left;    Family History  Problem Relation Age of Onset   Arthritis Mother    Diabetes Mother    Heart disease Mother    Hyperlipidemia Mother    Hypertension Mother    Arthritis Father    Asthma Father    Heart attack Father    Hyperlipidemia Father    Hypertension Father    Stroke Father    Arthritis Sister    Diabetes Sister    Hypertension Sister    Arthritis Sister    Diabetes Sister    Hyperlipidemia Sister    Hypertension Sister    Stroke Sister    Ovarian cancer Sister 59   Pancreatic cancer Sister 50   Uterine cancer Sister 79   Hyperlipidemia Sister    Hypertension Sister    Arthritis Brother    Heart attack Brother    Heart disease Brother    Hyperlipidemia Brother    Hypertension Brother    Alcohol abuse  Brother    Arthritis  Brother    Diabetes Brother    Early death Brother    Heart disease Brother    Hyperlipidemia Brother    Hypertension Brother    Cancer Maternal Aunt 74       unknown type   Throat cancer Maternal Grandfather        hx smoking/drinking   Hypertension Daughter    Cancer Daughter        female cancer cells   Kidney cancer Nephew        dx 36s   Colon cancer Neg Hx    Breast cancer Neg Hx    Prostate cancer Neg Hx     Social History   Socioeconomic History   Marital status: Widowed    Spouse name: Not on file   Number of children: 3   Years of education: Not on file   Highest education level: Not on file  Occupational History   Occupation: Retired  Tobacco Use   Smoking status: Never   Smokeless tobacco: Never  Vaping Use   Vaping status: Never Used  Substance and Sexual Activity   Alcohol use: No    Alcohol/week: 0.0 standard drinks of alcohol   Drug use: No   Sexual activity: Not Currently    Birth control/protection: Post-menopausal  Other Topics Concern   Not on file  Social History Narrative   Husband recently passed away on 2020/05/19.   Social Drivers of Corporate investment banker Strain: Low Risk  (10/06/2022)   Overall Financial Resource Strain (CARDIA)    Difficulty of Paying Living Expenses: Not hard at all  Food Insecurity: No Food Insecurity (07/22/2023)   Hunger Vital Sign    Worried About Running Out of Food in the Last Year: Never true    Ran Out of Food in the Last Year: Never true  Transportation Needs: No Transportation Needs (07/22/2023)   PRAPARE - Administrator, Civil Service (Medical): No    Lack of Transportation (Non-Medical): No  Physical Activity: Not on file  Stress: Not on file  Social Connections: Moderately Integrated (07/22/2023)   Social Connection and Isolation Panel    Frequency of Communication with Friends and Family: More than three times a week    Frequency of Social Gatherings with  Friends and Family: More than three times a week    Attends Religious Services: More than 4 times per year    Active Member of [employer] or Organizations: Yes    Attends Banker Meetings: More than 4 times per year    Marital Status: Widowed    Current Medications:  Current Outpatient Medications:    amiodarone  (PACERONE ) 200 MG tablet, Take 1/2 (one-half) tablet by mouth once daily, Disp: 45 tablet, Rfl: 2   apixaban  (ELIQUIS ) 2.5 MG TABS tablet, Take 1 tablet (2.5 mg total) by mouth 2 (two) times daily., Disp: , Rfl:    dapagliflozin  propanediol (FARXIGA ) 10 MG TABS tablet, Take 1 tablet (10 mg total) by mouth daily before breakfast., Disp: , Rfl:    dicyclomine  (BENTYL ) 20 MG tablet, 1 tablet Orally Three times a day, Disp: , Rfl:    fenofibrate  160 MG tablet, Take 1 tablet (160 mg total) by mouth daily., Disp: , Rfl:    insulin  aspart protamine- aspart (NOVOLOG  MIX 70/30) (70-30) 100 UNIT/ML injection, INJECT 20 UNITS IN THE MORNING AND 10 UNITS IN THE EVENING SUBCUTANEOUSLY TITRATE TO DAILY MAX OF 100 UNITS, Disp: 10 mL, Rfl: 11  isosorbide  mononitrate (IMDUR ) 60 MG 24 hr tablet, Take 1 tablet by mouth once daily, Disp: 90 tablet, Rfl: 3   levothyroxine  (SYNTHROID ) 175 MCG tablet, Take 175 mcg by mouth daily., Disp: , Rfl:    metoprolol  tartrate (LOPRESSOR ) 50 MG tablet, Take 1 tablet by mouth twice daily, Disp: 180 tablet, Rfl: 1   olmesartan  (BENICAR ) 40 MG tablet, Take 40 mg by mouth daily., Disp: , Rfl:    spironolactone  (ALDACTONE ) 25 MG tablet, Take 1 tablet by mouth once daily, Disp: 90 tablet, Rfl: 3   torsemide  (DEMADEX ) 5 MG tablet, Take 5 mg by mouth daily. Take 10 mg on day 1 alternate with 20 mg daily, Disp: , Rfl:   Review of Systems: + Voice changes, shortness of breath, diarrhea, incontinence, joint pain Denies appetite changes, fevers, chills, fatigue, unexplained weight changes. Denies hearing loss, neck lumps or masses, mouth sores, ringing in ears. Denies  cough or wheezing.   Denies chest pain or palpitations. Denies leg swelling. Denies abdominal distention, pain, blood in stools, constipation, nausea, vomiting, or early satiety. Denies pain with intercourse, dysuria, frequency, hematuria. Denies hot flashes, pelvic pain, vaginal bleeding or vaginal discharge.   Denies back pain or muscle pain/cramps. Denies itching, rash, or wounds. Denies dizziness, headaches, numbness or seizures. Denies swollen lymph nodes or glands, denies easy bruising or bleeding. Denies anxiety, depression, confusion, or decreased concentration.  Physical Exam: BP (!) 135/48 (BP Location: Right Arm, Patient Position: Sitting)   Pulse 86   Temp 98.2 F (36.8 C) (Oral)   Resp 19   Wt 169 lb (76.7 kg)   SpO2 93%   BMI 30.91 kg/m  General: Alert, oriented, no acute distress. HEENT: Posterior oropharynx clear, sclera anicteric. Chest: Clear to auscultation bilaterally.  No wheezes or rhonchi. Cardiovascular: Regular rate and rhythm, no murmurs. Abdomen: Obese, soft, nontender.  Normoactive bowel sounds.  No masses or hepatosplenomegaly appreciated.  Extremities: Grossly normal range of motion.  Warm, well perfused.  Trace edema bilaterally. Skin: No rashes or lesions noted. Lymphatics: No cervical, supraclavicular, or inguinal adenopathy. GU: External female genitalia atrophic with radiation changes present especially along the perineum and posterior fourchette.  No inguinal adenopathy appreciated.  There is an area of leukoplakia anteriorly as well as some leukoplakia along the perineum, both stable from last visit.  Prior biopsy site from June still visible.  Some leukoplakia along the distal vagina on the left at approximately 3:00 and at 7:00.  Laboratory & Radiologic Studies: None new  Assessment & Plan: LITTIE CHIEM is a 78 y.o. 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  Completed adjuvant  radiation in 12/2020. Vulvar biopsy performed 06/2022, no recurrence.   Patient is overall doing well.  She continues to have several spots consistent with lichen sclerosus-stable.  Given appearance of her vulva, we will plan on follow-up visit in 3 months and again in 6 months.  The patient finds some relief with Desitin but does not like to use this because of how messy it is.  Has tried Vaseline without much relief.  We talked about trial of other barriers including coconut oil and vitamin E.   She has follow-up with GI next week.   We reviewed signs and symptoms that would be concerning for disease recurrence, and I have stressed the importance of her calling if she develops any of these between visits.  22 minutes of total time was spent for  this patient encounter, including preparation, face-to-face counseling with the patient and coordination of care, and documentation of the encounter.  Comer Dollar, MD  Division of Gynecologic Oncology  Department of Obstetrics and Gynecology  [facility] [place]  [facility]

## 2023-08-30 NOTE — Progress Notes (Unsigned)
 GI Office Note    Referring Provider: Clarice Nottingham, MD Primary Care Physician:  Clarice Nottingham, MD  Primary Gastroenterologist: Ozell Hollingshead, MD   Chief Complaint   No chief complaint on file.   History of Present Illness   [name] is a 78 y.o. female presenting today for hospital follow-up.  She was admitted last month with painful hematochezia.  Several large-volume bloody stools with clots.  Hemoglobin trended from 13.1-11.4.  She was suspected to have a diverticular bleed.  She did not require any intervention.  Patient also seen previously for chronic urgency/tenesmus, constant fecal leakage since radiation for vulvar cancer in 2022.  She was last seen in the office on July 14, 2023 for follow-up noting much improvement in her stool consistency and control since starting dicyclomine .  She had an overtly stopped Imodium  and cholestyramine  but seem to be doing okay at that time.    Prior Data     Colonoscopy 11/2021: - Diverticulosis in the entire examined colon. - Six 2 to 6 mm polyps in the cecum. Hot and cold snare removed. Polyp ablation performed. - The examination was otherwise normal on direct and retroflexion views. - No specimens collected. -Rectal and random colon biopsies negative.   -3 tubular adenomas removed. -Consider colonoscopy in 3 years if overall health permits    Medications   Current Outpatient Medications  Medication Sig Dispense Refill   amiodarone  (PACERONE ) 200 MG tablet Take 1/2 (one-half) tablet by mouth once daily 45 tablet 2   apixaban  (ELIQUIS ) 2.5 MG TABS tablet Take 1 tablet (2.5 mg total) by mouth 2 (two) times daily.     dapagliflozin  propanediol (FARXIGA ) 10 MG TABS tablet Take 1 tablet (10 mg total) by mouth daily before breakfast.     dicyclomine  (BENTYL ) 20 MG tablet 1 tablet Orally Three times a day     fenofibrate  160 MG tablet Take 1 tablet (160 mg total) by mouth daily.     insulin  aspart protamine- aspart  (NOVOLOG  MIX 70/30) (70-30) 100 UNIT/ML injection INJECT 20 UNITS IN THE MORNING AND 10 UNITS IN THE EVENING SUBCUTANEOUSLY TITRATE TO DAILY MAX OF 100 UNITS 10 mL 11   isosorbide  mononitrate (IMDUR ) 60 MG 24 hr tablet Take 1 tablet by mouth once daily 90 tablet 3   levothyroxine  (SYNTHROID ) 175 MCG tablet Take 175 mcg by mouth daily.     metoprolol  tartrate (LOPRESSOR ) 50 MG tablet Take 1 tablet by mouth twice daily 180 tablet 1   olmesartan  (BENICAR ) 40 MG tablet Take 40 mg by mouth daily.     spironolactone  (ALDACTONE ) 25 MG tablet Take 1 tablet by mouth once daily 90 tablet 3   torsemide  (DEMADEX ) 5 MG tablet Take 5 mg by mouth daily. Take 10 mg on day 1 alternate with 20 mg daily     No current facility-administered medications for this visit.    Allergies   Allergies as of 08/31/2023 - Review Complete 08/28/2023  Allergen Reaction Noted   Hydrocodone  Other (See Comments) 04/29/2015   Invokana [canagliflozin] Palpitations and Other (See Comments) 04/29/2015   Oxycodone Other (See Comments) 04/29/2015     Past Medical History   Past Medical History:  Diagnosis Date   Arthritis    CAD (coronary artery disease)    Diabetes mellitus without complication (HCC)    Diastolic heart failure (HCC)    Dyspnea    Encounter for care of pacemaker 09/03/2018   Family history of kidney cancer  Family history of throat cancer    Family history of uterine cancer    History of chickenpox    History of radiation therapy    Pelvis 10/23/20-12/14/20- Dr. Lynwood Nasuti   Hx of psoriatic arthritis    Hyperlipemia    Hypertension    Hypertension 05/03/2018   Hypothyroidism    Mitral valve disorders(424.0)    Myocardial infarction (HCC)    Obstructive sleep apnea    OSA (obstructive sleep apnea)    using BiPAP   Pacemaker    Paroxysmal atrial fibrillation (HCC) 10/11/2016   Peripheral neuropathy    Renal disorder Kidney disease stage2   Sick sinus syndrome [facility])    S/p dual-chamber  pacemaker   Thyroid  disease hypothyroid   Urine incontinence    Vitamin D  deficiency    Vulvar cancer (HCC) 2022   s/p surgery and radiation therapy October-December 2022    Past Surgical History   Past Surgical History:  Procedure Laterality Date   BACK SURGERY     BACK SURGERY     BIOPSY  11/20/2021   Procedure: BIOPSY;  Surgeon: Shaaron Lamar HERO, MD;  [facility];  Service: Endoscopy;;   CARDIAC CATHETERIZATION N/A 04/29/2015   Procedure: Temporary Pacemaker;  Surgeon: Gordy Bergamo, MD;  [facility] INV[facility] CV LAB;  Service: Cardiovascular;  Laterality: N/A;   CARDIOVERSION N/A 09/07/2018   Procedure: CARDIOVERSION;  Surgeon: Bergamo Gordy, MD;  Location: [facility] ENDOSCOPY;  Service: Cardiovascular;  Laterality: N/A;   CARDIOVERSION     COLONOSCOPY  08/16/2008   Dr. Shaaron; pancolonic diverticulosis, otherwise no abnormalities.  Consider repeat in 10 years.   COLONOSCOPY WITH PROPOFOL  N/A 11/20/2021   Procedure: COLONOSCOPY WITH PROPOFOL ;  Surgeon: Shaaron Lamar HERO, MD;  [facility];  Service: Endoscopy;  Laterality: N/A;  8:15am, asa 3   CORONARY PRESSURE/FFR STUDY N/A 08/31/2017   Procedure: INTRAVASCULAR PRESSURE WIRE/FFR STUDY;  Surgeon: Elmira Newman PARAS, MD;  [facility]: [facility] CV LAB;  Service: Cardiovascular;  Laterality: N/A;   EP IMPLANTABLE DEVICE N/A 04/30/2015   Procedure: Pacemaker Implant;  Surgeon: Danelle LELON Birmingham, MD;  Location: [facility] INVASIVE CV LAB;  Service: Cardiovascular;  Laterality: N/A;   LEFT AND RIGHT HEART CATHETERIZATION WITH CORONARY ANGIOGRAM N/A 09/30/2011   Procedure: LEFT AND RIGHT HEART CATHETERIZATION WITH CORONARY ANGIOGRAM;  Surgeon: Erick JONELLE Bergamo, MD;  Location: [facility] CATH LAB;  Service: Cardiovascular;  Laterality: N/A;   POLYPECTOMY  11/20/2021   Procedure: POLYPECTOMY;  Surgeon: Shaaron Lamar HERO, MD;  [facility];  Service: Endoscopy;;   RIGHT/LEFT HEART CATH AND CORONARY ANGIOGRAPHY N/A 08/31/2017   Procedure:  RIGHT/LEFT HEART CATH AND CORONARY ANGIOGRAPHY;  Surgeon: Elmira Newman PARAS, MD;  [facility];  Service: Cardiovascular;  Laterality: N/A;   TOOTH EXTRACTION  10/07/2018   TUBAL LIGATION     VULVA MARYBETH BIOPSY N/A 07/26/2020   Procedure: VULVAR BIOPSY;  Surgeon: Viktoria Comer JONELLE, MD;  Location: [facility] ORS;  Service: Gynecology;  Laterality: N/A;   YAG LASER APPLICATION Right 03/07/2014   Procedure: YAG LASER APPLICATION;  Surgeon: Oneil T. Roz, MD;  Location: [facility] ORS;  Service: Ophthalmology;  Laterality: Right;  pt knows to arrive at 11:15   YAG LASER APPLICATION Left 03/21/2014   Procedure: YAG LASER APPLICATION;  Surgeon: Oneil Roz, MD;  Location: [facility];  Service: Ophthalmology;  Laterality: Left;    Past Family History   Family History  Problem Relation Age of Onset  Arthritis Mother    Diabetes Mother    Heart disease Mother    Hyperlipidemia Mother    Hypertension Mother    Arthritis Father    Asthma Father    Heart attack Father    Hyperlipidemia Father    Hypertension Father    Stroke Father    Arthritis Sister    Diabetes Sister    Hypertension Sister    Arthritis Sister    Diabetes Sister    Hyperlipidemia Sister    Hypertension Sister    Stroke Sister    Ovarian cancer Sister 70   Pancreatic cancer Sister 64   Uterine cancer Sister 66   Hyperlipidemia Sister    Hypertension Sister    Arthritis Brother    Heart attack Brother    Heart disease Brother    Hyperlipidemia Brother    Hypertension Brother    Alcohol abuse Brother    Arthritis Brother    Diabetes Brother    Early death Brother    Heart disease Brother    Hyperlipidemia Brother    Hypertension Brother    Cancer Maternal Aunt 29       unknown type   Throat cancer Maternal Grandfather        hx smoking/drinking   Hypertension Daughter    Cancer Daughter        female cancer cells   Kidney cancer Nephew        dx 74s   Colon cancer Neg Hx    Breast cancer Neg  Hx    Prostate cancer Neg Hx     Past Social History   Social History   Socioeconomic History   Marital status: Widowed    Spouse name: Not on file   Number of children: 3   Years of education: Not on file   Highest education level: Not on file  Occupational History   Occupation: Retired  Tobacco Use   Smoking status: Never   Smokeless tobacco: Never  Vaping Use   Vaping status: Never Used  Substance and Sexual Activity   Alcohol use: No    Alcohol/week: 0.0 standard drinks of alcohol   Drug use: No   Sexual activity: Not Currently    Birth control/protection: Post-menopausal  Other Topics Concern   Not on file  Social History Narrative   Husband recently passed away on April 30, 2020.   Social Drivers of Corporate investment banker Strain: Low Risk  (10/06/2022)   Overall Financial Resource Strain (CARDIA)    Difficulty of Paying Living Expenses: Not hard at all  Food Insecurity: No Food Insecurity (07/22/2023)   Hunger Vital Sign    Worried About Running Out of Food in the Last Year: Never true    Ran Out of Food in the Last Year: Never true  Transportation Needs: No Transportation Needs (07/22/2023)   PRAPARE - Administrator, Civil Service (Medical): No    Lack of Transportation (Non-Medical): No  Physical Activity: Not on file  Stress: Not on file  Social Connections: Moderately Integrated (07/22/2023)   Social Connection and Isolation Panel    Frequency of Communication with Friends and Family: More than three times a week    Frequency of Social Gatherings with Friends and Family: More than three times a week    Attends Religious Services: More than 4 times per year    Active Member of [employer] or Organizations: Yes    Attends Banker Meetings: More than  4 times per year    Marital Status: Widowed  Intimate Partner Violence: Not At Risk (07/22/2023)   Humiliation, Afraid, Rape, and Kick questionnaire    Fear of Current or Ex-Partner: No     Emotionally Abused: No    Physically Abused: No    Sexually Abused: No    Review of Systems   General: Negative for anorexia, weight loss, fever, chills, fatigue, weakness. ENT: Negative for hoarseness, difficulty swallowing , nasal congestion. CV: Negative for chest pain, angina, palpitations, dyspnea on exertion, peripheral edema.  Respiratory: Negative for dyspnea at rest, dyspnea on exertion, cough, sputum, wheezing.  GI: See history of present illness. GU:  Negative for dysuria, hematuria, urinary incontinence, urinary frequency, nocturnal urination.  Endo: Negative for unusual weight change.     Physical Exam   There were no vitals taken for this visit.   General: Well-nourished, well-developed in no acute distress.  Eyes: No icterus. Mouth: Oropharyngeal mucosa moist and pink   Lungs: Clear to auscultation bilaterally.  Heart: Regular rate and rhythm, no murmurs rubs or gallops.  Abdomen: Bowel sounds are normal, nontender, nondistended, no hepatosplenomegaly or masses,  no abdominal bruits or hernia , no rebound or guarding.  Rectal: not performed Extremities: No lower extremity edema. No clubbing or deformities. Neuro: Alert and oriented x 4   Skin: Warm and dry, no jaundice.   Psych: Alert and cooperative, normal mood and affect.  Labs   Lab Results  Component Value Date   NA 136 07/24/2023   CL 101 07/24/2023   K 4.0 07/24/2023   CO2 24 07/24/2023   BUN 32 (H) 07/24/2023   CREATININE 1.36 (H) 07/24/2023   GFRNONAA 39 (L) 07/24/2023   CALCIUM  10.3 07/24/2023   PHOS 2.8 10/08/2022   ALBUMIN 3.5 07/21/2023   GLUCOSE 122 (H) 07/24/2023   Lab Results  Component Value Date   ALT 16 07/21/2023   AST 20 07/21/2023   ALKPHOS 33 (L) 07/21/2023   BILITOT 0.7 07/21/2023   Lab Results  Component Value Date   WBC 6.0 07/24/2023   HGB 11.4 (L) 07/24/2023   HCT 36.2 07/24/2023   MCV 98.9 07/24/2023   PLT 158 07/24/2023   No results found for: IRON, TIBC,  FERRITIN  Imaging Studies   No results found.  Assessment/Plan:           [name], MHS, PA-C [facility]

## 2023-08-30 NOTE — Progress Notes (Deleted)
 SLEEP MEDICINE OFFICE VISIT NOTE:   Date:  08/30/2023   ID:  [name], DOB 1945-05-01, MRN [id number] The patient was identified using 2 identifiers.  PCP:  Clarice Nottingham, MD  Cardiologist:  Gordy Bergamo, MD    Referring MD: Clarice Nottingham, MD   No chief complaint on file.   History of Present Illness:    [name] is a 78 y.o. female with a hx of CAD, DM, chronic diastolic CHF, HTN, HLD, OSA on BIPAP who had been followed for OSA by Dr. Burnard and is now referred to be to establish care upon his retirement.    At her last OV with Dr. Burnard she was using an AirSense 10 BiPAP unit and Dr. Burnard order her a new device with EPAP min 8cm H2O, IPAP max 20cm H2O and PS of 4cm H2O.  She is now back for followup for her 90 day compliance checkin.   She is doing well with her PAP device and thinks that she has gotten used to it.  She tolerates the mask and feels the pressure is adequate.  Since going on PAP she feels rested in the am and has no significant daytime sleepiness.  She denies any significant mouth or nasal dryness or nasal congestion.  She does not think that he snores. An Epworth Sleepiness Scale score was calculated the office today and this endorsed at ### arguing against residual daytime sleepiness. Patient denies any episodes of bruxism, restless legs, No gagging hallucinations or cataplectic events.    Past Medical History:  Diagnosis Date   Arthritis    CAD (coronary artery disease)    Diabetes mellitus without complication (HCC)    Diastolic heart failure (HCC)    Dyspnea    Encounter for care of pacemaker 09/03/2018   Family history of kidney cancer    Family history of throat cancer    Family history of uterine cancer    History of chickenpox    History of radiation therapy    Pelvis 10/23/20-12/14/20- Dr. Lynwood Nasuti   Hx of psoriatic arthritis    Hyperlipemia    Hypertension    Hypertension 05/03/2018   Hypothyroidism    Mitral valve disorders(424.0)     Myocardial infarction (HCC)    Obstructive sleep apnea    OSA (obstructive sleep apnea)    using BiPAP   Pacemaker    Paroxysmal atrial fibrillation (HCC) 10/11/2016   Peripheral neuropathy    Renal disorder Kidney disease stage2   Sick sinus syndrome [facility])    S/p dual-chamber pacemaker   Thyroid  disease hypothyroid   Urine incontinence    Vitamin D  deficiency    Vulvar cancer (HCC) 2022   s/p surgery and radiation therapy October-December 2022    Past Surgical History:  Procedure Laterality Date   BACK SURGERY     BACK SURGERY     BIOPSY  11/20/2021   Procedure: BIOPSY;  Surgeon: Shaaron Lamar HERO, MD;  [facility];  Service: Endoscopy;;   CARDIAC CATHETERIZATION N/A 04/29/2015   Procedure: Temporary Pacemaker;  Surgeon: Gordy Bergamo, MD;  [facility] LAB;  Service: Cardiovascular;  Laterality: N/A;   CARDIOVERSION N/A 09/07/2018   Procedure: CARDIOVERSION;  Surgeon: Bergamo Gordy, MD;  Location: [facility] ENDOSCOPY;  Service: Cardiovascular;  Laterality: N/A;   CARDIOVERSION     COLONOSCOPY  08/16/2008   Dr. Shaaron; pancolonic diverticulosis, otherwise no abnormalities.  Consider repeat in 10 years.   COLONOSCOPY WITH PROPOFOL   N/A 11/20/2021   Procedure: COLONOSCOPY WITH PROPOFOL ;  Surgeon: Shaaron Lamar HERO, MD;  [facility];  Service: Endoscopy;  Laterality: N/A;  8:15am, asa 3   CORONARY PRESSURE/FFR STUDY N/A 08/31/2017   Procedure: INTRAVASCULAR PRESSURE WIRE/FFR STUDY;  Surgeon: Elmira Newman PARAS, MD;  [facility] CV LAB;  Service: Cardiovascular;  Laterality: N/A;   EP IMPLANTABLE DEVICE N/A 04/30/2015   Procedure: Pacemaker Implant;  Surgeon: Danelle LELON Birmingham, MD;  Location: [facility] INVASIVE CV LAB;  Service: Cardiovascular;  Laterality: N/A;   LEFT AND RIGHT HEART CATHETERIZATION WITH CORONARY ANGIOGRAM N/A 09/30/2011   Procedure: LEFT AND RIGHT HEART CATHETERIZATION WITH CORONARY ANGIOGRAM;  Surgeon: Erick JONELLE Bergamo, MD;  Location: [facility] CATH  LAB;  Service: Cardiovascular;  Laterality: N/A;   POLYPECTOMY  11/20/2021   Procedure: POLYPECTOMY;  Surgeon: Shaaron Lamar HERO, MD;  [facility];  Service: Endoscopy;;   RIGHT/LEFT HEART CATH AND CORONARY ANGIOGRAPHY N/A 08/31/2017   Procedure: RIGHT/LEFT HEART CATH AND CORONARY ANGIOGRAPHY;  Surgeon: Elmira Newman PARAS, MD;  [facility];  Service: Cardiovascular;  Laterality: N/A;   TOOTH EXTRACTION  10/07/2018   TUBAL LIGATION     VULVA MARYBETH BIOPSY N/A 07/26/2020   Procedure: VULVAR BIOPSY;  Surgeon: Viktoria Comer JONELLE, MD;  Location: [facility] ORS;  Service: Gynecology;  Laterality: N/A;   YAG LASER APPLICATION Right 03/07/2014   Procedure: YAG LASER APPLICATION;  Surgeon: Oneil T. Roz, MD;  Location: [facility];  Service: Ophthalmology;  Laterality: Right;  pt knows to arrive at 11:15   YAG LASER APPLICATION Left 03/21/2014   Procedure: YAG LASER APPLICATION;  Surgeon: Oneil Roz, MD;  Location: [facility];  Service: Ophthalmology;  Laterality: Left;    Current Medications: No outpatient medications have been marked as taking for the 08/31/23 encounter (Appointment) with Shlomo Wilbert JONELLE, MD.     Allergies:   Hydrocodone , Invokana [canagliflozin], and Oxycodone   Social History   Socioeconomic History   Marital status: Widowed    Spouse name: Not on file   Number of children: 3   Years of education: Not on file   Highest education level: Not on file  Occupational History   Occupation: Retired  Tobacco Use   Smoking status: Never   Smokeless tobacco: Never  Vaping Use   Vaping status: Never Used  Substance and Sexual Activity   Alcohol use: No    Alcohol/week: 0.0 standard drinks of alcohol   Drug use: No   Sexual activity: Not Currently    Birth control/protection: Post-menopausal  Other Topics Concern   Not on file  Social History Narrative   Husband recently passed away on 2020-05-27.   Social Drivers of Corporate investment banker  Strain: Low Risk  (10/06/2022)   Overall Financial Resource Strain (CARDIA)    Difficulty of Paying Living Expenses: Not hard at all  Food Insecurity: No Food Insecurity (07/22/2023)   Hunger Vital Sign    Worried About Running Out of Food in the Last Year: Never true    Ran Out of Food in the Last Year: Never true  Transportation Needs: No Transportation Needs (07/22/2023)   PRAPARE - Administrator, Civil Service (Medical): No    Lack of Transportation (Non-Medical): No  Physical Activity: Not on file  Stress: Not on file  Social Connections: Moderately Integrated (07/22/2023)   Social Connection and Isolation Panel    Frequency of Communication with Friends and Family: More  than three times a week    Frequency of Social Gatherings with Friends and Family: More than three times a week    Attends Religious Services: More than 4 times per year    Active Member of Clubs or Organizations: Yes    Attends Banker Meetings: More than 4 times per year    Marital Status: Widowed     Family History: The patient's family history includes Alcohol abuse in her brother; Arthritis in her brother, brother, father, mother, sister, and sister; Asthma in her father; Cancer in her daughter; Cancer (age of onset: 39) in her maternal aunt; Diabetes in her brother, mother, sister, and sister; Early death in her brother; Heart attack in her brother and father; Heart disease in her brother, brother, and mother; Hyperlipidemia in her brother, brother, father, mother, sister, and sister; Hypertension in her brother, brother, daughter, father, mother, sister, sister, and sister; Kidney cancer in her nephew; Ovarian cancer (age of onset: 70) in her sister; Pancreatic cancer (age of onset: 59) in her sister; Stroke in her father and sister; Throat cancer in her maternal grandfather; Uterine cancer (age of onset: 56) in her sister. There is no history of Colon cancer, Breast cancer, or Prostate  cancer.  ROS:   Please see the history of present illness.    ROS  All other systems reviewed and negative.   EKGs/Labs/Other Studies Reviewed:    The following studies were reviewed today: PAP compliance download  Physical Exam:    VS:  There were no vitals taken for this visit.    Wt Readings from Last 3 Encounters:  08/28/23 169 lb (76.7 kg)  07/22/23 169 lb 12.1 oz (77 kg)  07/14/23 172 lb 6.4 oz (78.2 kg)    GEN: Well nourished, well developed in no acute distress HEENT: Normal NECK: No JVD; No carotid bruits LYMPHATICS: No lymphadenopathy CARDIAC:RRR, no murmurs, rubs, gallops RESPIRATORY:  Clear to auscultation without rales, wheezing or rhonchi  ABDOMEN: Soft, non-tender, non-distended MUSCULOSKELETAL:  No edema; No deformity  SKIN: Warm and dry NEUROLOGIC:  Alert and oriented x 3 PSYCHIATRIC:  Normal affect   ASSESSMENT:    No diagnosis found. PLAN:    In order of problems listed above:  OSA - The patient is tolerating PAP therapy well without any problems. The PAP download performed by his DME was personally reviewed and interpreted by me today and showed an AHI of ***/hr on *** cm H2O with ***% compliance in using more than 4 hours nightly.  The patient has been using and benefiting from PAP use and will continue to benefit from therapy.   HTN -BP controlled on exam today -continue Imdur  50mg  daily, Lopressor  50mg  BID, spiro 25mg  daily and Benicar  40mg  daily with PRN refills    Time Spent: 20 minutes total time of encounter, including 15 minutes spent in face-to-face patient care on the date of this encounter. This time includes coordination of care and counseling regarding above mentioned problem list. Remainder of non-face-to-face time involved reviewing chart documents/testing relevant to the patient encounter and documentation in the medical record. I have independently reviewed documentation from referring provider  Medication Adjustments/Labs and  Tests Ordered: Current medicines are reviewed at length with the patient today.  Concerns regarding medicines are outlined above.  No orders of the defined types were placed in this encounter.  No orders of the defined types were placed in this encounter.   Signed, Wilbert Bihari, MD  08/30/2023 9:28 PM  [facility]

## 2023-08-31 ENCOUNTER — Ambulatory Visit: Admitting: Gastroenterology

## 2023-08-31 ENCOUNTER — Ambulatory Visit: Attending: Cardiology | Admitting: Cardiology

## 2023-08-31 ENCOUNTER — Encounter: Payer: Self-pay | Admitting: Gastroenterology

## 2023-08-31 VITALS — BP 96/58 | HR 80 | Temp 98.3°F | Ht 62.0 in | Wt 171.8 lb

## 2023-08-31 DIAGNOSIS — R152 Fecal urgency: Secondary | ICD-10-CM

## 2023-08-31 DIAGNOSIS — G4733 Obstructive sleep apnea (adult) (pediatric): Secondary | ICD-10-CM

## 2023-08-31 DIAGNOSIS — D62 Acute posthemorrhagic anemia: Secondary | ICD-10-CM | POA: Insufficient documentation

## 2023-08-31 DIAGNOSIS — K529 Noninfective gastroenteritis and colitis, unspecified: Secondary | ICD-10-CM | POA: Diagnosis not present

## 2023-08-31 DIAGNOSIS — I959 Hypotension, unspecified: Secondary | ICD-10-CM

## 2023-08-31 DIAGNOSIS — I10 Essential (primary) hypertension: Secondary | ICD-10-CM

## 2023-08-31 DIAGNOSIS — R159 Full incontinence of feces: Secondary | ICD-10-CM | POA: Diagnosis not present

## 2023-08-31 MED ORDER — LOPERAMIDE HCL 2 MG PO CAPS: ORAL_CAPSULE | ORAL | 3 refills | Status: DC

## 2023-08-31 NOTE — Patient Instructions (Signed)
 Continue dicyclomine  20mg  before breakfast and at bedtime.  Add back imodium  (loperamide ) 2mg  late morning, supper. I will sent in prescription.   Update labs today at [facility]. We will be in touch with results.  If your fatigue worsens, or you have other symptoms like lightheadedness/dizziness please reach out to us  or your PCP as this could be secondary to your low blood pressure. I am checking for possible anemia that could contribute.   Keep appointment as planned in 01/2024 but reach out sooner if any questions or concerns.

## 2023-09-01 LAB — CBC WITH DIFFERENTIAL/PLATELET
Basophils Absolute: 0 x10E3/uL (ref 0.0–0.2)
Basos: 1 %
EOS (ABSOLUTE): 0.1 x10E3/uL (ref 0.0–0.4)
Eos: 1 %
Hematocrit: 43.7 % (ref 34.0–46.6)
Hemoglobin: 13.5 g/dL (ref 11.1–15.9)
Immature Grans (Abs): 0 x10E3/uL (ref 0.0–0.1)
Immature Granulocytes: 0 %
Lymphocytes Absolute: 2.1 x10E3/uL (ref 0.7–3.1)
Lymphs: 30 %
MCH: 30.4 pg (ref 26.6–33.0)
MCHC: 30.9 g/dL — ABNORMAL LOW (ref 31.5–35.7)
MCV: 98 fL — ABNORMAL HIGH (ref 79–97)
Monocytes Absolute: 0.6 x10E3/uL (ref 0.1–0.9)
Monocytes: 9 %
Neutrophils Absolute: 4.2 x10E3/uL (ref 1.4–7.0)
Neutrophils: 59 %
Platelets: 161 x10E3/uL (ref 150–450)
RBC: 4.44 x10E6/uL (ref 3.77–5.28)
RDW: 12.4 % (ref 11.7–15.4)
WBC: 7 x10E3/uL (ref 3.4–10.8)

## 2023-09-01 LAB — BASIC METABOLIC PANEL WITH GFR
BUN/Creatinine Ratio: 20 (ref 12–28)
BUN: 34 mg/dL — ABNORMAL HIGH (ref 8–27)
CO2: 25 mmol/L (ref 20–29)
Calcium: 10.6 mg/dL — ABNORMAL HIGH (ref 8.7–10.3)
Chloride: 102 mmol/L (ref 96–106)
Creatinine, Ser: 1.66 mg/dL — ABNORMAL HIGH (ref 0.57–1.00)
Glucose: 258 mg/dL — ABNORMAL HIGH (ref 70–99)
Potassium: 5.4 mmol/L — ABNORMAL HIGH (ref 3.5–5.2)
Sodium: 142 mmol/L (ref 134–144)
eGFR: 31 mL/min/1.73 — ABNORMAL LOW (ref >59)

## 2023-09-02 ENCOUNTER — Other Ambulatory Visit: Payer: Self-pay

## 2023-09-02 ENCOUNTER — Ambulatory Visit: Payer: Self-pay | Admitting: Gastroenterology

## 2023-09-02 DIAGNOSIS — D62 Acute posthemorrhagic anemia: Secondary | ICD-10-CM

## 2023-09-07 ENCOUNTER — Other Ambulatory Visit: Payer: Self-pay | Admitting: Cardiology

## 2023-09-09 DIAGNOSIS — E039 Hypothyroidism, unspecified: Secondary | ICD-10-CM | POA: Diagnosis not present

## 2023-09-09 DIAGNOSIS — E1122 Type 2 diabetes mellitus with diabetic chronic kidney disease: Secondary | ICD-10-CM | POA: Diagnosis not present

## 2023-09-09 DIAGNOSIS — D500 Iron deficiency anemia secondary to blood loss (chronic): Secondary | ICD-10-CM | POA: Diagnosis not present

## 2023-09-09 DIAGNOSIS — E559 Vitamin D deficiency, unspecified: Secondary | ICD-10-CM | POA: Diagnosis not present

## 2023-09-10 ENCOUNTER — Encounter: Payer: Self-pay | Admitting: Cardiology

## 2023-09-10 ENCOUNTER — Ambulatory Visit: Attending: Cardiology | Admitting: Cardiology

## 2023-09-10 VITALS — BP 129/75 | HR 83 | Resp 16 | Ht 62.0 in | Wt 172.0 lb

## 2023-09-10 DIAGNOSIS — I480 Paroxysmal atrial fibrillation: Secondary | ICD-10-CM

## 2023-09-10 DIAGNOSIS — I495 Sick sinus syndrome: Secondary | ICD-10-CM

## 2023-09-10 DIAGNOSIS — I10 Essential (primary) hypertension: Secondary | ICD-10-CM

## 2023-09-10 DIAGNOSIS — I5032 Chronic diastolic (congestive) heart failure: Secondary | ICD-10-CM | POA: Diagnosis not present

## 2023-09-10 NOTE — Patient Instructions (Signed)
 Medication Instructions:  Your physician recommends that you continue on your current medications as directed. Please refer to the Current Medication list given to you today.  *If you need a refill on your cardiac medications before your next appointment, please call your pharmacy*  Lab Work: none If you have labs (blood work) drawn today and your tests are completely normal, you will receive your results only by: MyChart Message (if you have MyChart) OR A paper copy in the mail If you have any lab test that is abnormal or we need to change your treatment, we will call you to review the results.  Testing/Procedures: none  Follow-Up: At [facility], you and your health needs are our priority.  As part of our continuing mission to provide you with exceptional heart care, our providers are all part of one team.  This team includes your primary Cardiologist (physician) and Advanced Practice Providers or APPs (Physician Assistants and Nurse Practitioners) who all work together to provide you with the care you need, when you need it.  Your next appointment:   12 month(s)  Provider:   Gordy Bergamo, MD    We recommend signing up for the patient portal called MyChart.  Sign up information is provided on this After Visit Summary.  MyChart is used to connect with patients for Virtual Visits (Telemedicine).  Patients are able to view lab/test results, encounter notes, upcoming appointments, etc.  Non-urgent messages can be sent to your provider as well.   To learn more about what you can do with MyChart, go to [email or web address].   Other Instructions

## 2023-09-10 NOTE — Progress Notes (Signed)
 Cardiology Office Note:  .   Date:  09/13/2023  ID:  [name], DOB 03/13/1945, MRN [id number] PCP: Clarice Nottingham, MD  [facility] Providers Cardiologist:  Gordy Bergamo, MD   History of Present Illness: .   [name] is a 78 y.o. Caucasian female patient with primary hypertension, diabetes mellitus with stage IIIb chronic kidney disease, chronic diastolic heart failure, hypercholesterolemia, chronic diarrhea probably related to diabetic autonomic insufficiency and gastroparesis, nonobstructive CAD (Cath 2017 & 2019), paroxysmal atrial fibrillation and atrial flutter and sick sinus syndrome status post dual-chamber pacemaker 2017, complex OSA on BiPAP. Her last cardioversion was 09/07/2018 for atrial flutter.   She was admitted to the hospital on 07/21/2022 and discharged 4 days later with lower GI bleed/rectal bleed felt to be due to diverticulosis patient was recommended to avoid ibuprofen  which she was taking for arthritis and discharged home with conservative management and Eliquis  was restarted.  This is a 12-month office visit.  Cardiac Studies relevent.    ECHOCARDIOGRAM COMPLETE 10/05/2022  1. Left ventricular ejection fraction, by estimation, is 65 to 70%. The left ventricle has normal function. The left ventricle has no regional wall motion abnormalities. There is moderate left ventricular hypertrophy.  Left ventricular diastolic parameters are indeterminate.     Discussed the use of AI scribe software for clinical note transcription with the patient, who gave verbal consent to proceed.  History of Present Illness [name] is an 78 year old female with diverticulosis who presents with a history of rectal bleeding.  She was hospitalized for significant rectal bleeding on July 21, 2023, with no further bleeding since. She has low blood pressure, leading to the discontinuation of isosorbide  mononitrate during her hospital stay. Her current medications include  torsemide  10 mg daily, spironolactone  25 mg once a day, and metoprolol  tartrate 50 mg twice a day. She takes amiodarone  200 mg, half a tablet daily, for paroxysmal atrial fibrillation. No recent episodes of atrial fibrillation and her breathing has been stable. She is concerned about safe pain medication options due to kidney issues and occasionally takes Tylenol  PM for pain relief.  Labs   Recent Labs    07/22/23 0504 07/23/23 0454 07/24/23 0442 08/31/23 1339  NA 139 137 136 142  K 4.9 4.5 4.0 5.4*  CL 101 101 101 102  CO2 29 28 24 25   GLUCOSE 103* 108* 122* 258*  BUN 30* 28* 32* 34*  CREATININE 1.47* 1.29* 1.36* 1.66*  CALCIUM  9.8 10.1 10.3 10.6*  GFRNONAA 36* 42* 39*  --     Lab Results  Component Value Date   ALT 16 07/21/2023   AST 20 07/21/2023   ALKPHOS 33 (L) 07/21/2023   BILITOT 0.7 07/21/2023      Latest Ref Rng & Units 08/31/2023    1:39 PM 07/24/2023    4:42 AM 07/23/2023    2:00 PM  CBC  WBC 3.4 - 10.8 x10E3/uL 7.0  6.0  6.8   Hemoglobin 11.1 - 15.9 g/dL 86.4  88.5  88.1   Hematocrit 34.0 - 46.6 % 43.7  36.2  38.2   Platelets 150 - 450 x10E3/uL 161  158  176    Lab Results  Component Value Date   HGBA1C 5.6 07/21/2023    Lab Results  Component Value Date   TSH 1.308 10/04/2022     ROS  Review of Systems  Cardiovascular:  Negative for chest pain, dyspnea on exertion and leg swelling.   Physical Exam:  VS:  BP 129/75 (BP Location: Left Arm, Patient Position: Sitting, Cuff Size: Normal)   Pulse 83   Resp 16   Ht 5' 2 (1.575 m)   Wt 172 lb (78 kg)   SpO2 95%   BMI 31.46 kg/m    Wt Readings from Last 3 Encounters:  09/10/23 172 lb (78 kg)  08/31/23 171 lb 12.8 oz (77.9 kg)  08/28/23 169 lb (76.7 kg)    BP Readings from Last 3 Encounters:  09/10/23 129/75  08/31/23 (!) 96/58  08/28/23 (!) 135/48   Physical Exam Neck:     Vascular: No carotid bruit or JVD.  Cardiovascular:     Rate and Rhythm: Normal rate and regular rhythm.     Pulses:  Intact distal pulses.     Heart sounds: Normal heart sounds. No murmur heard.    No gallop.  Pulmonary:     Effort: Pulmonary effort is normal.     Breath sounds: Normal breath sounds.  Abdominal:     General: Bowel sounds are normal.     Palpations: Abdomen is soft.  Musculoskeletal:     Right lower leg: No edema.     Left lower leg: No edema.    EKG:         ASSESSMENT AND PLAN: .      ICD-10-CM   1. Paroxysmal atrial fibrillation (HCC)  I48.0     2. Chronic diastolic heart failure (HCC)  P49.67     3. Primary hypertension  I10     4. Sinus node dysfunction (HCC)  I49.5      Assessment & Plan Diverticulosis with recent lower gastrointestinal bleeding (resolved) Recent episode of lower gastrointestinal bleeding attributed to diverticulosis, now resolved. - Advise to avoid ibuprofen  to prevent further gastrointestinal complications. - Instruct to immediately stop taking Eliquis  if any bleeding is noticed and inform the healthcare provider.  Chronic diastolic heart failure Chronic diastolic heart failure managed with torsemide , spironolactone , and metoprolol . Recent adjustment in torsemide  dosage to 10 mg daily. - Continue torsemide  10 mg daily. - Continue spironolactone  25 mg once daily. - Continue metoprolol  tartrate 50 mg twice daily.  Paroxysmal atrial fibrillation, status post pacemaker and sick sinus syndrome Paroxysmal atrial fibrillation managed with amiodarone  and pacemaker. Recent pacemaker transmission showed no episodes of atrial fibrillation. Pacemaker functioning well. - Continue amiodarone  200 mg, half tablet daily. - Ensure pacemaker transmission is sent as scheduled. - Pacemaker interrogation 07/01/2023 revealing a paced ventricular sensed rhythm, no atrial fibrillation, battery life 6.5 years.  Essential hypertension Essential hypertension managed with Actozart and isosorbide  mononitrate. Recent adjustment in Actozart dosage to 75 mg. Isosorbide   mononitrate was temporarily stopped due to hypotension during hospitalization but has been resumed. - Continue Actozart 75 mg daily. - Continue isosorbide  mononitrate as previously prescribed.  Follow up: 1 year  Signed,  Gordy Bergamo, MD, [facility] 09/13/2023, 1:32 PM [facility] [address] [place], [place] [address] Phone: [phone]. Fax:  [phone]

## 2023-09-11 NOTE — Progress Notes (Signed)
 Remote pacemaker transmission.

## 2023-09-11 NOTE — Addendum Note (Signed)
 Addended by: VICCI SELLER A on: 09/11/2023 03:16 PM   Modules accepted: Orders

## 2023-09-22 DIAGNOSIS — E039 Hypothyroidism, unspecified: Secondary | ICD-10-CM | POA: Diagnosis not present

## 2023-09-22 DIAGNOSIS — E559 Vitamin D deficiency, unspecified: Secondary | ICD-10-CM | POA: Diagnosis not present

## 2023-09-26 DIAGNOSIS — E1165 Type 2 diabetes mellitus with hyperglycemia: Secondary | ICD-10-CM | POA: Diagnosis not present

## 2023-09-27 ENCOUNTER — Other Ambulatory Visit: Payer: Self-pay | Admitting: Internal Medicine

## 2023-09-29 ENCOUNTER — Ambulatory Visit (INDEPENDENT_AMBULATORY_CARE_PROVIDER_SITE_OTHER): Payer: Medicare HMO

## 2023-09-29 DIAGNOSIS — E039 Hypothyroidism, unspecified: Secondary | ICD-10-CM | POA: Diagnosis not present

## 2023-09-29 DIAGNOSIS — E1122 Type 2 diabetes mellitus with diabetic chronic kidney disease: Secondary | ICD-10-CM | POA: Diagnosis not present

## 2023-09-29 DIAGNOSIS — I129 Hypertensive chronic kidney disease with stage 1 through stage 4 chronic kidney disease, or unspecified chronic kidney disease: Secondary | ICD-10-CM | POA: Diagnosis not present

## 2023-09-29 DIAGNOSIS — I480 Paroxysmal atrial fibrillation: Secondary | ICD-10-CM | POA: Diagnosis not present

## 2023-09-29 DIAGNOSIS — G629 Polyneuropathy, unspecified: Secondary | ICD-10-CM | POA: Diagnosis not present

## 2023-09-29 DIAGNOSIS — E559 Vitamin D deficiency, unspecified: Secondary | ICD-10-CM | POA: Diagnosis not present

## 2023-09-29 DIAGNOSIS — E785 Hyperlipidemia, unspecified: Secondary | ICD-10-CM | POA: Diagnosis not present

## 2023-10-01 LAB — CUP PACEART REMOTE DEVICE CHECK
Battery Impedance: 819 Ohm
Battery Remaining Longevity: 76 mo
Battery Voltage: 2.78 V
Brady Statistic AP VP Percent: 0 %
Brady Statistic AP VS Percent: 100 %
Brady Statistic AS VP Percent: 0 %
Brady Statistic AS VS Percent: 0 %
Date Time Interrogation Session: 20250918103633
Implantable Lead Connection Status: 753985
Implantable Lead Connection Status: 753985
Implantable Lead Implant Date: 20170417
Implantable Lead Implant Date: 20170417
Implantable Lead Location: 753859
Implantable Lead Location: 753860
Implantable Lead Model: 5076
Implantable Lead Model: 5076
Implantable Pulse Generator Implant Date: 20170417
Lead Channel Impedance Value: 420 Ohm
Lead Channel Impedance Value: 496 Ohm
Lead Channel Pacing Threshold Amplitude: 0.5 V
Lead Channel Pacing Threshold Amplitude: 0.875 V
Lead Channel Pacing Threshold Pulse Width: 0.4 ms
Lead Channel Pacing Threshold Pulse Width: 0.4 ms
Lead Channel Setting Pacing Amplitude: 1.5 V
Lead Channel Setting Pacing Amplitude: 2 V
Lead Channel Setting Pacing Pulse Width: 0.4 ms
Lead Channel Setting Sensing Sensitivity: 5.6 mV
Zone Setting Status: 755011
Zone Setting Status: 755011

## 2023-10-05 NOTE — Progress Notes (Signed)
 Remote PPM Transmission

## 2023-10-10 ENCOUNTER — Ambulatory Visit: Payer: Self-pay | Admitting: Internal Medicine

## 2023-10-12 ENCOUNTER — Other Ambulatory Visit: Payer: Self-pay | Admitting: Cardiology

## 2023-10-12 DIAGNOSIS — I480 Paroxysmal atrial fibrillation: Secondary | ICD-10-CM

## 2023-10-12 NOTE — Telephone Encounter (Signed)
 Eliquis  2.5mg  refill request received. Patient is 78 years old, weight-78kg, Crea-1.66 on 08/31/23, Diagnosis-Afib, and last seen by Dr. Ladona on 09/10/23. Dose is appropriate based on dosing criteria. Will send in refill to requested pharmacy.

## 2023-10-26 DIAGNOSIS — E1165 Type 2 diabetes mellitus with hyperglycemia: Secondary | ICD-10-CM | POA: Diagnosis not present

## 2023-11-26 DIAGNOSIS — E1165 Type 2 diabetes mellitus with hyperglycemia: Secondary | ICD-10-CM | POA: Diagnosis not present

## 2023-12-03 ENCOUNTER — Encounter: Payer: Self-pay | Admitting: Gynecologic Oncology

## 2023-12-08 ENCOUNTER — Inpatient Hospital Stay: Attending: Gynecologic Oncology | Admitting: Gynecologic Oncology

## 2023-12-08 VITALS — BP 135/60 | HR 88 | Temp 98.6°F | Resp 16 | Ht 62.0 in | Wt 168.0 lb

## 2023-12-08 DIAGNOSIS — R32 Unspecified urinary incontinence: Secondary | ICD-10-CM | POA: Diagnosis not present

## 2023-12-08 DIAGNOSIS — Z8544 Personal history of malignant neoplasm of other female genital organs: Secondary | ICD-10-CM | POA: Diagnosis not present

## 2023-12-08 DIAGNOSIS — Z808 Family history of malignant neoplasm of other organs or systems: Secondary | ICD-10-CM | POA: Insufficient documentation

## 2023-12-08 DIAGNOSIS — R21 Rash and other nonspecific skin eruption: Secondary | ICD-10-CM | POA: Insufficient documentation

## 2023-12-08 DIAGNOSIS — Z923 Personal history of irradiation: Secondary | ICD-10-CM | POA: Insufficient documentation

## 2023-12-08 DIAGNOSIS — Z800 Family history of malignant neoplasm of digestive organs: Secondary | ICD-10-CM | POA: Diagnosis not present

## 2023-12-08 DIAGNOSIS — Z8049 Family history of malignant neoplasm of other genital organs: Secondary | ICD-10-CM | POA: Diagnosis not present

## 2023-12-08 DIAGNOSIS — Z8041 Family history of malignant neoplasm of ovary: Secondary | ICD-10-CM | POA: Insufficient documentation

## 2023-12-08 DIAGNOSIS — Z8051 Family history of malignant neoplasm of kidney: Secondary | ICD-10-CM | POA: Diagnosis not present

## 2023-12-08 DIAGNOSIS — C519 Malignant neoplasm of vulva, unspecified: Secondary | ICD-10-CM

## 2023-12-08 NOTE — Progress Notes (Signed)
 Gynecologic Oncology Return Clinic Visit  12/08/23  Reason for Visit: Follow-up in the setting of vulvar cancer    Treatment History: Oncology History  Vulvar cancer (HCC)  07/20/2020 Initial Diagnosis   Vulvar cancer (HCC)   07/26/2020 Surgery   EUA, vulvar biopsies  Findings: Pictures taken in media.  On exam, there is about a 4 x 3-1/2 cm area concerning for involvement by carcinoma that spans between the clitoris (replacing the bottom aspect of the clitoris) to just superior to the urethra.  This is a butterfly lesion that extends along the inner labia bilaterally.  Previous biopsy site is healing well with suture still intact.  There is a firmer and raised area that is approximately 1 cm around the area of the biopsy.  Biopsies taken circumferentially around the lesion noted as well as from the central aspect to help delineate what is cancer and what may be chronic dermatoses.  Given appearance today with patient more comfortable, I am suspicious that the entire lesion is carcinoma.   07/26/2020 Pathology Results   A. VULVA, 6 OCLOCK, INFERIOR ASPECT, BIOPSY:  - Superficial fragments of squamous cell carcinoma.   B. VULVA, 11 OCLOCK, BIOPSY:  - Invasive squamous cell carcinoma.  - No lymphovascular invasion.   C. VULVA, 2 OCLOCK, BIOPSY:  - Invasive squamous cell carcinoma.  - No lymphovascular invasion.   D. VULVA, CENTRAL, BIOPSY:  - Superficial fragment of squamous cell carcinoma.    08/06/2020 Imaging   PET: no evidence of metastatic disease   08/07/2020 Clinical Stage   Stage IB   08/22/2020 Surgery   Partial radical anterior vulvectomy with right inguinal SLN biopsy.  Findings: 4x4cm butterfly lesion on the anterior vulva, spanning from just superior to the clitoris to just superior to the urethral meatus. Lesion crossed the midline, more significant lesion on the right. LSG prior to surgery showed bilateral mapping. No hot or blue SLN identified on the left, mildly blue  and hot node identified as the SLN on the right. Decision made to abort left-sided LND given hypertension and difficulty ventilating. Additionally, given intra-op cardiac and respiratory status, decision made to proceed with a less radical resection (very close if not positive peri-urethral margin visibly.   08/22/2020 Pathology Results   Stage IB, lymph node assessment only on the right given comorbidities and cardiac issues during surgery  Tumor Focality:    Unifocal     Tumor Site:    Right vulva     Tumor Site:    Left vulva     Tumor Size:    Greatest Dimension (Centimeters): 4 cm     Histologic Type:    Squamous cell carcinoma, HPV-independent     Histologic Grade:    G3, poorly differentiated     Depth of Tumor Invasion:    6 mm     Tumor Border:    Infiltrating     Other Tissue / Organ Involvement:    Not applicable     Lymphovascular Invasion:    Not identified   MARGINS     Margin Status for Invasive Carcinoma:    All margins negative for invasive carcinoma       Closest Margin(s) to Invasive Carcinoma:    Peripheral: 12:00       Distance from Invasive Carcinoma to Closest Margin:    1 mm     Margin Status for HSIL (VIN2-3) or dVIN:    All margins negative for high-grade squamous intraepithelial lesion (HSIL) and /  or differentiated vulvar intraepithelial neoplasia (dVIN)   REGIONAL LYMPH NODES     Regional Lymph Node Status:           :    All regional lymph nodes negative for tumor cells       Total Number of Lymph Nodes Examined:    1         Nodal Site(s) Examined:    Right inguinal       Number of Sentinel Nodes Examined:    1    09/06/2020 Genetic Testing   Negative genetic testing:  No pathogenic variants detected on the [facility] Multi-Cancer + RNA panel. A variant of uncertain significance (VUS) was detected in the POLD1 gene called c.427G>A (p.Gly143Ser). The report date is 09/06/2020.  The Multi-Cancer + RNA Panel offered by [facility] includes sequencing and/or  deletion/duplication analysis of the following 84 genes:  AIP*, ALK, APC*, ATM*, AXIN2*, BAP1*, BARD1*, BLM*, BMPR1A*, BRCA1*, BRCA2*, BRIP1*, CASR, CDC73*, CDH1*, CDK4, CDKN1B*, CDKN1C*, CDKN2A, CEBPA, CHEK2*, CTNNA1*, DICER1*, DIS3L2*, EGFR, EPCAM, FH*, FLCN*, GATA2*, GPC3, GREM1, HOXB13, HRAS, KIT, MAX*, MEN1*, MET, MITF, MLH1*, MSH2*, MSH3*, MSH6*, MUTYH*, NBN*, NF1*, NF2*, NTHL1*, PALB2*, PDGFRA, PHOX2B, PMS2*, POLD1*, POLE*, POT1*, PRKAR1A*, PTCH1*, PTEN*, RAD50*, RAD51C*, RAD51D*, RB1*, RECQL4, RET, RUNX1*, SDHA*, SDHAF2*, SDHB*, SDHC*, SDHD*, SMAD4*, SMARCA4*, SMARCB1*, SMARCE1*, STK11*, SUFU*, TERC, TERT, TMEM127*, Tp53*, TSC1*, TSC2*, VHL*, WRN*, and WT1.  RNA analysis is performed for * genes.    10/23/2020 - 12/14/2020 Radiation Therapy   10/23/2020 through 12/14/2020 Site Technique Total Dose (Gy) Dose per Fx (Gy) Completed Fx Beam Energies  Vulva: Pelvis IMRT 50.4/50.4 1.8 28/28 6X  Vulva: Pelvis_Bst IMRT 9/9 1.8 5/5 6X         Interval History: The patient presents today to the office for continued follow-up.  She reports overall doing well since her last visit.  She reports a decrease in her appetite but states she is still eating.  She states in the past she used to be able to eat 2 sausage biscuits and now she eats half to one.  No nausea or emesis.  She does report having loose stools and incontinence.  No blood noted in her stool since the episode in July of this year.  She was started on 2 prescription medications by her GI and feels the symptoms are improving over the past month.  She has bladder urgency and has to empty her bladder prior to doing certain things.  She does wear a pad.  No vaginal bleeding or discharge reported.  She does report chronic vulvar soreness.  She also reports soreness at the prior biopsy site.  She is currently using Aquaphor as a barrier for the skin on her vulva.  No lower extremity edema or pain reported.  Denies chest pain or dyspnea.  She has started  noticing dizziness when getting up in the morning and in the evening, especially if she moves fast.  She does not walk long distances since her legs will give out. Overall no concerning symptoms for recurrence.  Past Medical/Surgical History: Past Medical History:  Diagnosis Date   Arthritis    CAD (coronary artery disease)    Diabetes mellitus without complication (HCC)    Diastolic heart failure (HCC)    Dyspnea    Encounter for care of pacemaker 09/03/2018   Family history of kidney cancer    Family history of throat cancer    Family history of uterine cancer    History of chickenpox  History of radiation therapy    Pelvis 10/23/20-12/14/20- Dr. Lynwood Nasuti   Hx of psoriatic arthritis    Hyperlipemia    Hypertension    Hypertension 05/03/2018   Hypothyroidism    Mitral valve disorders(424.0)    Myocardial infarction (HCC)    Obstructive sleep apnea    OSA (obstructive sleep apnea)    using BiPAP   Pacemaker    Paroxysmal atrial fibrillation (HCC) 10/11/2016   Peripheral neuropathy    Renal disorder Kidney disease stage2   Sick sinus syndrome [facility])    S/p dual-chamber pacemaker   Thyroid  disease hypothyroid   Urine incontinence    Vitamin D  deficiency    Vulvar cancer (HCC) 2022   s/p surgery and radiation therapy October-December 2022    Past Surgical History:  Procedure Laterality Date   BACK SURGERY     BACK SURGERY     BIOPSY  11/20/2021   Procedure: BIOPSY;  Surgeon: Shaaron Lamar HERO, MD;  Location: [facility];  Service: Endoscopy;;   CARDIAC CATHETERIZATION N/A 04/29/2015   Procedure: Temporary Pacemaker;  Surgeon: Gordy Bergamo, MD;  Location: [facility] INVASIVE CV LAB;  Service: Cardiovascular;  Laterality: N/A;   CARDIOVERSION N/A 09/07/2018   Procedure: CARDIOVERSION;  Surgeon: Bergamo Gordy, MD;  Location: [facility] ENDOSCOPY;  Service: Cardiovascular;  Laterality: N/A;   CARDIOVERSION     COLONOSCOPY  08/16/2008   Dr. Shaaron; pancolonic diverticulosis, otherwise no  abnormalities.  Consider repeat in 10 years.   COLONOSCOPY WITH PROPOFOL  N/A 11/20/2021   Procedure: COLONOSCOPY WITH PROPOFOL ;  Surgeon: Shaaron Lamar HERO, MD;  Location: [facility];  Service: Endoscopy;  Laterality: N/A;  8:15am, asa 3   CORONARY PRESSURE/FFR STUDY N/A 08/31/2017   Procedure: INTRAVASCULAR PRESSURE WIRE/FFR STUDY;  Surgeon: Elmira Newman PARAS, MD;  Location: [facility] INVASIVE CV LAB;  Service: Cardiovascular;  Laterality: N/A;   EP IMPLANTABLE DEVICE N/A 04/30/2015   Procedure: Pacemaker Implant;  Surgeon: Danelle LELON Birmingham, MD;  Location: [facility] INVASIVE CV LAB;  Service: Cardiovascular;  Laterality: N/A;   LEFT AND RIGHT HEART CATHETERIZATION WITH CORONARY ANGIOGRAM N/A 09/30/2011   Procedure: LEFT AND RIGHT HEART CATHETERIZATION WITH CORONARY ANGIOGRAM;  Surgeon: Erick JONELLE Bergamo, MD;  Location: [facility] CATH LAB;  Service: Cardiovascular;  Laterality: N/A;   POLYPECTOMY  11/20/2021   Procedure: POLYPECTOMY;  Surgeon: Shaaron Lamar HERO, MD;  Location: [facility];  Service: Endoscopy;;   RIGHT/LEFT HEART CATH AND CORONARY ANGIOGRAPHY N/A 08/31/2017   Procedure: RIGHT/LEFT HEART CATH AND CORONARY ANGIOGRAPHY;  Surgeon: Elmira Newman PARAS, MD;  Location: [facility] INVASIVE CV LAB;  Service: Cardiovascular;  Laterality: N/A;   TOOTH EXTRACTION  10/07/2018   TUBAL LIGATION     VULVA MARYBETH BIOPSY N/A 07/26/2020   Procedure: VULVAR BIOPSY;  Surgeon: Viktoria Comer JONELLE, MD;  Location: [facility] ORS;  Service: Gynecology;  Laterality: N/A;   YAG LASER APPLICATION Right 03/07/2014   Procedure: YAG LASER APPLICATION;  Surgeon: Oneil T. Roz, MD;  Location: [facility] ORS;  Service: Ophthalmology;  Laterality: Right;  pt knows to arrive at 11:15   YAG LASER APPLICATION Left 03/21/2014   Procedure: YAG LASER APPLICATION;  Surgeon: Oneil Roz, MD;  Location: [facility] ORS;  Service: Ophthalmology;  Laterality: Left;    Family History  Problem Relation Age of Onset   Arthritis Mother    Diabetes Mother    Heart  disease Mother    Hyperlipidemia Mother    Hypertension Mother    Arthritis Father    Asthma  Father    Heart attack Father    Hyperlipidemia Father    Hypertension Father    Stroke Father    Arthritis Sister    Diabetes Sister    Hypertension Sister    Arthritis Sister    Diabetes Sister    Hyperlipidemia Sister    Hypertension Sister    Stroke Sister    Ovarian cancer Sister 31   Pancreatic cancer Sister 71   Uterine cancer Sister 55   Hyperlipidemia Sister    Hypertension Sister    Arthritis Brother    Heart attack Brother    Heart disease Brother    Hyperlipidemia Brother    Hypertension Brother    Alcohol abuse Brother    Arthritis Brother    Diabetes Brother    Early death Brother    Heart disease Brother    Hyperlipidemia Brother    Hypertension Brother    Cancer Maternal Aunt 37       unknown type   Throat cancer Maternal Grandfather        hx smoking/drinking   Hypertension Daughter    Cancer Daughter        female cancer cells   Kidney cancer Nephew        dx 50s   Colon cancer Neg Hx    Breast cancer Neg Hx    Prostate cancer Neg Hx     Social History   Socioeconomic History   Marital status: Widowed    Spouse name: Not on file   Number of children: 3   Years of education: Not on file   Highest education level: Not on file  Occupational History   Occupation: Retired  Tobacco Use   Smoking status: Never   Smokeless tobacco: Never  Vaping Use   Vaping status: Never Used  Substance and Sexual Activity   Alcohol use: No    Alcohol/week: 0.0 standard drinks of alcohol   Drug use: No   Sexual activity: Not Currently    Birth control/protection: Post-menopausal  Other Topics Concern   Not on file  Social History Narrative   Husband recently passed away on 05/09/20.   Social Drivers of Corporate Investment Banker Strain: Low Risk  (10/06/2022)   Overall Financial Resource Strain (CARDIA)    Difficulty of Paying Living Expenses:  Not hard at all  Food Insecurity: No Food Insecurity (07/22/2023)   Hunger Vital Sign    Worried About Running Out of Food in the Last Year: Never true    Ran Out of Food in the Last Year: Never true  Transportation Needs: No Transportation Needs (07/22/2023)   PRAPARE - Administrator, Civil Service (Medical): No    Lack of Transportation (Non-Medical): No  Physical Activity: Not on file  Stress: Not on file  Social Connections: Moderately Integrated (07/22/2023)   Social Connection and Isolation Panel    Frequency of Communication with Friends and Family: More than three times a week    Frequency of Social Gatherings with Friends and Family: More than three times a week    Attends Religious Services: More than 4 times per year    Active Member of [employer] or Organizations: Yes    Attends Banker Meetings: More than 4 times per year    Marital Status: Widowed    Current Medications:  Current Outpatient Medications:    amiodarone  (PACERONE ) 200 MG tablet, Take 1/2 (one-half) tablet by mouth once daily, Disp: 45  tablet, Rfl: 2   apixaban  (ELIQUIS ) 2.5 MG TABS tablet, Take 1 tablet by mouth twice daily, Disp: 180 tablet, Rfl: 1   dapagliflozin  propanediol (FARXIGA ) 10 MG TABS tablet, Take 1 tablet (10 mg total) by mouth daily before breakfast., Disp: , Rfl:    dicyclomine  (BENTYL ) 20 MG tablet, Take 1 tablet (20 mg total) by mouth 2 (two) times daily., Disp: 60 tablet, Rfl: 3   fenofibrate  160 MG tablet, Take 1 tablet (160 mg total) by mouth daily., Disp: , Rfl:    insulin  aspart protamine- aspart (NOVOLOG  MIX 70/30) (70-30) 100 UNIT/ML injection, INJECT 20 UNITS IN THE MORNING AND 10 UNITS IN THE EVENING SUBCUTANEOUSLY TITRATE TO DAILY MAX OF 100 UNITS, Disp: 10 mL, Rfl: 11   isosorbide  mononitrate (IMDUR ) 60 MG 24 hr tablet, Take 1 tablet by mouth once daily, Disp: 90 tablet, Rfl: 3   levothyroxine  (SYNTHROID ) 175 MCG tablet, Take 175 mcg by mouth daily., Disp: , Rfl:     metoprolol  tartrate (LOPRESSOR ) 50 MG tablet, Take 1 tablet by mouth twice daily, Disp: 180 tablet, Rfl: 2   olmesartan  (BENICAR ) 40 MG tablet, Take 40 mg by mouth daily., Disp: , Rfl:    spironolactone  (ALDACTONE ) 25 MG tablet, Take 1 tablet by mouth once daily, Disp: 90 tablet, Rfl: 3   torsemide  (DEMADEX ) 10 MG tablet, Take 10 mg by mouth daily., Disp: , Rfl:   Review of Systems: + Appetite changes, palpitations, joint pain, fatigue, incontinence, dizziness. Denies fevers, chills, fatigue, unexplained weight changes. Denies hearing loss, neck lumps or masses, mouth sores, ringing in ears. Denies cough or wheezing.   Denies chest pain or palpitations. Denies leg swelling. Denies abdominal distention, pain, blood in stools, constipation, nausea, vomiting, or early satiety. Denies pain with intercourse, dysuria, frequency, hematuria. Denies hot flashes, pelvic pain, vaginal bleeding or vaginal discharge.   Denies back pain or muscle pain/cramps. Denies itching, rash, or wounds. Denies headaches, numbness or seizures. Denies swollen lymph nodes or glands, denies easy bruising or bleeding. Denies anxiety, depression, confusion, or decreased concentration.  Physical Exam: BP 135/60 (BP Location: Left Arm, Patient Position: Sitting)   Pulse 88   Temp 98.6 F (37 C) (Oral)   Resp 16   Ht 5' 2 (1.575 m)   Wt 168 lb (76.2 kg)   SpO2 98%   BMI 30.73 kg/m  General: Alert, oriented, no acute distress. HEENT: Posterior oropharynx clear, sclera anicteric. Chest: Clear to auscultation bilaterally.  No wheezes or rhonchi. Cardiovascular: Regular rate and rhythm, no murmurs. Abdomen: Obese, soft, nontender.  Normoactive bowel sounds.  No masses or hepatosplenomegaly appreciated.  Extremities: Grossly normal range of motion.  Warm, well perfused.  No edema. Skin: No rashes or lesions noted. Lymphatics: No cervical, supraclavicular, or inguinal adenopathy. GU: External female genitalia  atrophic with radiation changes present especially along the perineum and posterior fourchette.  Mild edema. No inguinal adenopathy appreciated.  There is an area of leukoplakia anteriorly as well as some leukoplakia along the perineum. Prior biopsy site from June still visible with no erythema or discharge.  Some leukoplakia along the distal vagina on the left at approximately 3:00 and at 7:00. One digit exam performed of the vagina. Tenderness with palpation near the introitus.  Laboratory & Radiologic Studies: None new  Assessment & Plan: SHAQUASIA CAPONIGRO is a 78 y.o. 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  Completed adjuvant radiation in 12/2020. Vulvar  biopsy performed 06/2022, no recurrence.   Patient is overall doing well.  She continues to have several spots consistent with lichen sclerosus-stable.  Given appearance of her vulva, we will continue with follow up visits every 3 months.  Given topical lidocaine , donut pillow, and peri bottle for vulvar care. She will continue using her skin barrier creams. We reviewed signs and symptoms that would be concerning for disease recurrence, and I have stressed the importance of her calling if she develops any of these between visits.  22 minutes of total time was spent for this patient encounter, including preparation, face-to-face counseling with the patient and coordination of care, and documentation of the encounter.  Eleanor Epps, NP [facility] GYN Oncology

## 2023-12-08 NOTE — Patient Instructions (Addendum)
 It was good to see you today. We will have Dr. Viktoria review the photo from your exam today to see if there have been any changes since your last visit. There continue to be areas of lichen sclerosus (white colored changes on the vulva) and radiation changes.  You can use the topical lidocaine  for discomfort as needed.    Dr. Viktoria will see you back in the office in 3 months for follow up.   As always, if you develop any new and concerning symptoms before your next visit, please call to see me sooner.  Happy Holidays to you!

## 2023-12-29 ENCOUNTER — Ambulatory Visit: Payer: Medicare HMO

## 2023-12-29 DIAGNOSIS — I480 Paroxysmal atrial fibrillation: Secondary | ICD-10-CM | POA: Diagnosis not present

## 2024-01-01 LAB — CUP PACEART REMOTE DEVICE CHECK
Battery Impedance: 895 Ohm
Battery Remaining Longevity: 73 mo
Battery Voltage: 2.78 V
Brady Statistic AP VP Percent: 0 %
Brady Statistic AP VS Percent: 100 %
Brady Statistic AS VP Percent: 0 %
Brady Statistic AS VS Percent: 0 %
Date Time Interrogation Session: 20251219110828
Implantable Lead Connection Status: 753985
Implantable Lead Connection Status: 753985
Implantable Lead Implant Date: 20170417
Implantable Lead Implant Date: 20170417
Implantable Lead Location: 753859
Implantable Lead Location: 753860
Implantable Lead Model: 5076
Implantable Lead Model: 5076
Implantable Pulse Generator Implant Date: 20170417
Lead Channel Impedance Value: 420 Ohm
Lead Channel Impedance Value: 494 Ohm
Lead Channel Pacing Threshold Amplitude: 0.5 V
Lead Channel Pacing Threshold Amplitude: 0.75 V
Lead Channel Pacing Threshold Pulse Width: 0.4 ms
Lead Channel Pacing Threshold Pulse Width: 0.4 ms
Lead Channel Setting Pacing Amplitude: 1.5 V
Lead Channel Setting Pacing Amplitude: 2 V
Lead Channel Setting Pacing Pulse Width: 0.4 ms
Lead Channel Setting Sensing Sensitivity: 5.6 mV
Zone Setting Status: 755011
Zone Setting Status: 755011

## 2024-01-01 NOTE — Progress Notes (Signed)
 Remote PPM Transmission

## 2024-01-08 ENCOUNTER — Ambulatory Visit: Payer: Self-pay | Admitting: Student in an Organized Health Care Education/Training Program

## 2024-01-18 ENCOUNTER — Ambulatory Visit: Admitting: Gastroenterology

## 2024-02-02 ENCOUNTER — Ambulatory Visit: Admitting: Gastroenterology

## 2024-02-02 ENCOUNTER — Encounter: Payer: Self-pay | Admitting: Gastroenterology

## 2024-02-02 VITALS — BP 115/64 | HR 84 | Temp 98.4°F | Ht 62.0 in | Wt 160.8 lb

## 2024-02-02 DIAGNOSIS — R630 Anorexia: Secondary | ICD-10-CM | POA: Diagnosis not present

## 2024-02-02 DIAGNOSIS — K529 Noninfective gastroenteritis and colitis, unspecified: Secondary | ICD-10-CM | POA: Diagnosis not present

## 2024-02-02 DIAGNOSIS — R634 Abnormal weight loss: Secondary | ICD-10-CM | POA: Diagnosis not present

## 2024-02-02 NOTE — Patient Instructions (Signed)
 Continue loperamide  2mg  before lunch and supper.  Continue dicyclomine  20mg  twice a day.   Monitor your weight. If you have ongoing weight loss, please let me know, especially if it is unexplained or you continue to have issues with your appetite. This could be a sign of something going on and would warrant additional work up.  Return to the office in 6 months or sooner if needed.

## 2024-02-02 NOTE — Progress Notes (Signed)
 "    GI Office Note    Referring Provider: Clarice Nottingham, MD Primary Care Physician:  Clarice Nottingham, MD  Primary Gastroenterologist: Ozell Hollingshead, MD   Chief Complaint   Chief Complaint  Patient presents with   Follow-up    Follow up on loose stools, anemia. Pt states the las pill you gave her did the job    History of Present Illness   [name] is a 79 y.o. female presenting today for follow up. Last seen in 08/2023 for hospital follow up. Admitted July with painless hematochezia with clots suspected diverticular bleed, did not require any intervention.  Patient also has a history of chronic urgency/tenesmus, chronic fecal leakage since radiation for vulvar cancer in 2022.  Well-managed with dicyclomine  20 mg twice daily, loperamide  2mg  before lunch and supper.   Discussed the use of AI scribe software for clinical note transcription with the patient, who gave verbal consent to proceed.  History of Present Illness [name] is an 79 year old female with type 2 diabetes mellitus and prior vulvar cancer treated with radiation who presents for follow-up of chronic diarrhea.  She takes dicyclomine  twice daily. Loperamide  twice daily. She has three to four bowel movements per day with occasional days without a bowel movement. Stools are no longer loose. No melena, brbpr. No abdominal pain.  Over the past couple of weeks, appetite has decreased and she has difficulty eating. She feels hungry but can only eat small amounts because eating makes her feel sick. Her weight has dropped 11 pounds since we last saw her. She is not sure if this was all recently or more gradual. She is happy about the weight loss and not concerned.   She attributes some difficulty eating to recent dental work, including an upper plate placed in October that causes soreness and does not fit well. She plans to follow up with dental for adjustment. She does not weigh herself often at home and is not  personally concerned about the weight loss.  Since Sunday afternoon, she has felt unwell with cough and thick phlegm without fever. She is awaiting a COVID/flu test kit to arrive from [employer] today. She notes several individuals at her church have had flu-like symptoms.  Her diabetes is treated with Novolog  insulin  25 units in the morning and 10 units at night. There have been no recent dose changes and glycemic control is described as good.   Wt Readings from Last 10 Encounters:  02/02/24 160 lb 12.8 oz (72.9 kg)  12/08/23 168 lb (76.2 kg)  09/10/23 172 lb (78 kg)  08/31/23 171 lb 12.8 oz (77.9 kg)  08/28/23 169 lb (76.7 kg)  07/22/23 169 lb 12.1 oz (77 kg)  07/14/23 172 lb 6.4 oz (78.2 kg)  06/01/23 168 lb (76.2 kg)  05/20/23 169 lb (76.7 kg)  03/30/23 172 lb (78 kg)     Prior Data     Results    Colonoscopy 11/2021: - Diverticulosis in the entire examined colon. - Six 2 to 6 mm polyps in the cecum. Hot and cold snare removed. Polyp ablation performed. - The examination was otherwise normal on direct and retroflexion views. - No specimens collected. -Rectal and random colon biopsies negative.   -3 tubular adenomas removed. -Consider colonoscopy in 3 years if overall health permits   Medications   Current Outpatient Medications  Medication Sig Dispense Refill   amiodarone  (PACERONE ) 200 MG tablet Take 1/2 (one-half) tablet by mouth once daily 45  tablet 2   apixaban  (ELIQUIS ) 2.5 MG TABS tablet Take 1 tablet by mouth twice daily 180 tablet 1   dapagliflozin  propanediol (FARXIGA ) 10 MG TABS tablet Take 1 tablet (10 mg total) by mouth daily before breakfast.     dicyclomine  (BENTYL ) 20 MG tablet Take 1 tablet (20 mg total) by mouth 2 (two) times daily. 60 tablet 3   fenofibrate  160 MG tablet Take 1 tablet (160 mg total) by mouth daily.     insulin  aspart protamine- aspart (NOVOLOG  MIX 70/30) (70-30) 100 UNIT/ML injection INJECT 20 UNITS IN THE MORNING AND 10 UNITS IN THE  EVENING SUBCUTANEOUSLY TITRATE TO DAILY MAX OF 100 UNITS 10 mL 11   isosorbide  mononitrate (IMDUR ) 60 MG 24 hr tablet Take 1 tablet by mouth once daily 90 tablet 3   levothyroxine  (SYNTHROID ) 175 MCG tablet Take 175 mcg by mouth daily.     metoprolol  tartrate (LOPRESSOR ) 50 MG tablet Take 1 tablet by mouth twice daily 180 tablet 2   olmesartan  (BENICAR ) 40 MG tablet Take 40 mg by mouth daily.     spironolactone  (ALDACTONE ) 25 MG tablet Take 1 tablet by mouth once daily 90 tablet 3   torsemide  (DEMADEX ) 10 MG tablet Take 10 mg by mouth daily.     No current facility-administered medications for this visit.    Allergies   Allergies as of 02/02/2024 - Review Complete 02/02/2024  Allergen Reaction Noted   Hydrocodone  Other (See Comments) 04/29/2015   Invokana [canagliflozin] Palpitations and Other (See Comments) 04/29/2015   Oxycodone Other (See Comments) 04/29/2015     Past Medical History   Past Medical History:  Diagnosis Date   Arthritis    CAD (coronary artery disease)    Diabetes mellitus without complication (HCC)    Diastolic heart failure (HCC)    Dyspnea    Encounter for care of pacemaker 09/03/2018   Family history of kidney cancer    Family history of throat cancer    Family history of uterine cancer    History of chickenpox    History of radiation therapy    Pelvis 10/23/20-12/14/20- Dr. Lynwood Nasuti   Hx of psoriatic arthritis    Hyperlipemia    Hypertension    Hypertension 05/03/2018   Hypothyroidism    Mitral valve disorders(424.0)    Myocardial infarction (HCC)    Obstructive sleep apnea    OSA (obstructive sleep apnea)    using BiPAP   Pacemaker    Paroxysmal atrial fibrillation (HCC) 10/11/2016   Peripheral neuropathy    Renal disorder Kidney disease stage2   Sick sinus syndrome [facility])    S/p dual-chamber pacemaker   Thyroid  disease hypothyroid   Urine incontinence    Vitamin D  deficiency    Vulvar cancer (HCC) 2022   s/p surgery and radiation  therapy October-December 2022    Past Surgical History   Past Surgical History:  Procedure Laterality Date   BACK SURGERY     BACK SURGERY     BIOPSY  11/20/2021   Procedure: BIOPSY;  Surgeon: Shaaron Lamar HERO, MD;  Location: [facility];  Service: Endoscopy;;   CARDIAC CATHETERIZATION N/A 04/29/2015   Procedure: Temporary Pacemaker;  Surgeon: Gordy Bergamo, MD;  Location: [facility] INVASIVE CV LAB;  Service: Cardiovascular;  Laterality: N/A;   CARDIOVERSION N/A 09/07/2018   Procedure: CARDIOVERSION;  Surgeon: Bergamo Gordy, MD;  Location: [facility] ENDOSCOPY;  Service: Cardiovascular;  Laterality: N/A;   CARDIOVERSION     COLONOSCOPY  08/16/2008  Dr. Shaaron; pancolonic diverticulosis, otherwise no abnormalities.  Consider repeat in 10 years.   COLONOSCOPY WITH PROPOFOL  N/A 11/20/2021   Procedure: COLONOSCOPY WITH PROPOFOL ;  Surgeon: Shaaron Lamar HERO, MD;  Location: [facility];  Service: Endoscopy;  Laterality: N/A;  8:15am, asa 3   CORONARY PRESSURE/FFR STUDY N/A 08/31/2017   Procedure: INTRAVASCULAR PRESSURE WIRE/FFR STUDY;  Surgeon: Elmira Newman PARAS, MD;  Location: [facility] INVASIVE CV LAB;  Service: Cardiovascular;  Laterality: N/A;   EP IMPLANTABLE DEVICE N/A 04/30/2015   Procedure: Pacemaker Implant;  Surgeon: Danelle LELON Birmingham, MD;  Location: [facility] INVASIVE CV LAB;  Service: Cardiovascular;  Laterality: N/A;   LEFT AND RIGHT HEART CATHETERIZATION WITH CORONARY ANGIOGRAM N/A 09/30/2011   Procedure: LEFT AND RIGHT HEART CATHETERIZATION WITH CORONARY ANGIOGRAM;  Surgeon: Erick JONELLE Bergamo, MD;  Location: [facility] CATH LAB;  Service: Cardiovascular;  Laterality: N/A;   POLYPECTOMY  11/20/2021   Procedure: POLYPECTOMY;  Surgeon: Shaaron Lamar HERO, MD;  Location: [facility];  Service: Endoscopy;;   RIGHT/LEFT HEART CATH AND CORONARY ANGIOGRAPHY N/A 08/31/2017   Procedure: RIGHT/LEFT HEART CATH AND CORONARY ANGIOGRAPHY;  Surgeon: Elmira Newman PARAS, MD;  Location: [facility] INV[facility] CV LAB;  Service: Cardiovascular;   Laterality: N/A;   TOOTH EXTRACTION  10/07/2018   TUBAL LIGATION     VULVA MARYBETH BIOPSY N/A 07/26/2020   Procedure: VULVAR BIOPSY;  Surgeon: Viktoria Comer JONELLE, MD;  Location: [facility] ORS;  Service: Gynecology;  Laterality: N/A;   YAG LASER APPLICATION Right 03/07/2014   Procedure: YAG LASER APPLICATION;  Surgeon: Oneil T. Roz, MD;  Location: [facility] ORS;  Service: Ophthalmology;  Laterality: Right;  pt knows to arrive at 11:15   YAG LASER APPLICATION Left 03/21/2014   Procedure: YAG LASER APPLICATION;  Surgeon: Oneil Roz, MD;  Location: [facility] OR[facility];  Service: Ophthalmology;  Laterality: Left;    Past Family History   Family History  Problem Relation Age of Onset   Arthritis Mother    Diabetes Mother    Heart disease Mother    Hyperlipidemia Mother    Hypertension Mother    Arthritis Father    Asthma Father    Heart attack Father    Hyperlipidemia Father    Hypertension Father    Stroke Father    Arthritis Sister    Diabetes Sister    Hypertension Sister    Arthritis Sister    Diabetes Sister    Hyperlipidemia Sister    Hypertension Sister    Stroke Sister    Ovarian cancer Sister 67   Pancreatic cancer Sister 96   Uterine cancer Sister 50   Hyperlipidemia Sister    Hypertension Sister    Arthritis Brother    Heart attack Brother    Heart disease Brother    Hyperlipidemia Brother    Hypertension Brother    Alcohol abuse Brother    Arthritis Brother    Diabetes Brother    Early death Brother    Heart disease Brother    Hyperlipidemia Brother    Hypertension Brother    Cancer Maternal Aunt 77       unknown type   Throat cancer Maternal Grandfather        hx smoking/drinking   Hypertension Daughter    Cancer Daughter        female cancer cells   Kidney cancer Nephew        dx 65s   Colon cancer Neg Hx    Breast cancer Neg Hx    Prostate cancer  Neg Hx     Past Social History   Social History   Socioeconomic History   Marital status: Widowed    Spouse  name: Not on file   Number of children: 3   Years of education: Not on file   Highest education level: Not on file  Occupational History   Occupation: Retired  Tobacco Use   Smoking status: Never   Smokeless tobacco: Never  Vaping Use   Vaping status: Never Used  Substance and Sexual Activity   Alcohol use: No    Alcohol/week: 0.0 standard drinks of alcohol   Drug use: No   Sexual activity: Not Currently    Birth control/protection: Post-menopausal  Other Topics Concern   Not on file  Social History Narrative   Husband recently passed away on 2020/05/13.   Social Drivers of Health   Tobacco Use: Low Risk (02/02/2024)   Patient History    Smoking Tobacco Use: Never    Smokeless Tobacco Use: Never    Passive Exposure: Not on file  Financial Resource Strain: Low Risk (10/06/2022)   Overall Financial Resource Strain (CARDIA)    Difficulty of Paying Living Expenses: Not hard at all  Food Insecurity: No Food Insecurity (07/22/2023)   Epic    Worried About Programme Researcher, Broadcasting/film/video in the Last Year: Never true    Ran Out of Food in the Last Year: Never true  Transportation Needs: No Transportation Needs (07/22/2023)   Epic    Lack of Transportation (Medical): No    Lack of Transportation (Non-Medical): No  Physical Activity: Not on file  Stress: Not on file  Social Connections: Moderately Integrated (07/22/2023)   Social Connection and Isolation Panel    Frequency of Communication with Friends and Family: More than three times a week    Frequency of Social Gatherings with Friends and Family: More than three times a week    Attends Religious Services: More than 4 times per year    Active Member of [employer] or Organizations: Yes    Attends Banker Meetings: More than 4 times per year    Marital Status: Widowed  Intimate Partner Violence: Not At Risk (07/22/2023)   Epic    Fear of Current or Ex-Partner: No    Emotionally Abused: No    Physically Abused: No    Sexually  Abused: No  Depression (PHQ2-9): Not on file  Alcohol Screen: Low Risk (10/06/2022)   Alcohol Screen    Last Alcohol Screening Score (AUDIT): 0  Housing: Low Risk (07/22/2023)   Epic    Unable to Pay for Housing in the Last Year: No    Number of Times Moved in the Last Year: 0    Homeless in the Last Year: No  Utilities: Not At Risk (07/22/2023)   Epic    Threatened with loss of utilities: No  Health Literacy: Not on file    Review of Systems   General: Negative for anorexia, weight loss, fever, chills,+ fatigue, weakness. ENT: Negative for hoarseness, difficulty swallowing , nasal congestion. CV: Negative for chest pain, angina, palpitations, dyspnea on exertion, peripheral edema.  Respiratory: Negative for dyspnea at rest, dyspnea on exertion, +cough, +sputum, wheezing.  GI: See history of present illness. GU:  Negative for dysuria, hematuria, urinary incontinence, urinary frequency, nocturnal urination.  Endo: Negative for unusual weight change.     Physical Exam   BP 115/64   Pulse 84   Temp 98.4 F (36.9 C)  Ht 5' 2 (1.575 m)   Wt 160 lb 12.8 oz (72.9 kg)   BMI 29.41 kg/m    General: Well-nourished, well-developed in no acute distress.  Eyes: No icterus. Mouth: Oropharyngeal mucosa moist and pink   Lungs: Clear to auscultation bilaterally.  Heart: Regular rate and rhythm, no murmurs rubs or gallops.  Abdomen: Bowel sounds are normal, nontender, nondistended, no hepatosplenomegaly or masses,  no abdominal bruits or hernia , no rebound or guarding.  Rectal: not performed Extremities: No lower extremity edema. No clubbing or deformities. Neuro: Alert and oriented x 4   Skin: Warm and dry, no jaundice.   Psych: Alert and cooperative, normal mood and affect.  Labs   No  recent labs Imaging Studies   No results found.  Assessment/Plan:    Assessment & Plan Chronic diarrhea/incontinence Symptoms noted since radiation for vulvar cancer in 2022. Work up  previously with colonoscopy 11/2021 with random colon biopsies benign. Stools studies negative. Symptoms improved with bentyl , imodium .  -continue bentyl  20mg  BID -continue loperamide  2mg  BID -return ov in six months or sooner if needed  Unintentional weight loss and anorexia Weight loss likely multifactorial due to decreased oral intake from dental issues and recent illness. Monitoring warranted given cancer history. She continues to follow with gyn/oncology. - Advised home weight monitoring every 2 weeks. - Instructed to report further weight decline or inability to maintain weight for further evaluation. - Emphasized addressing dental issues; encouraged follow-up with dentist for denture adjustment. - reach out if appetite does not pick up or notes persistent GI symptoms       Sonny RAMAN. Ezzard, MHS, PA-C [facility]  "

## 2024-02-09 ENCOUNTER — Other Ambulatory Visit: Payer: Self-pay | Admitting: Cardiology

## 2024-02-09 DIAGNOSIS — I480 Paroxysmal atrial fibrillation: Secondary | ICD-10-CM

## 2024-02-13 ENCOUNTER — Other Ambulatory Visit: Payer: Self-pay | Admitting: Gastroenterology

## 2024-03-10 ENCOUNTER — Inpatient Hospital Stay: Admitting: Gynecologic Oncology

## 2024-03-29 ENCOUNTER — Ambulatory Visit

## 2024-06-28 ENCOUNTER — Ambulatory Visit

## 2024-09-27 ENCOUNTER — Ambulatory Visit

## 2024-12-27 ENCOUNTER — Ambulatory Visit

## 2025-03-28 ENCOUNTER — Ambulatory Visit
